# Patient Record
Sex: Male | Born: 1955 | Race: White | Hispanic: No | Marital: Married | Smoking: Never smoker
Health system: Southern US, Community
[De-identification: ages and names within clinical notes are randomized; demographics above are authoritative.]

## PROBLEM LIST (undated history)

## (undated) DIAGNOSIS — R413 Other amnesia: Secondary | ICD-10-CM

## (undated) DIAGNOSIS — E785 Hyperlipidemia, unspecified: Secondary | ICD-10-CM

## (undated) DIAGNOSIS — I10 Essential (primary) hypertension: Secondary | ICD-10-CM

## (undated) DIAGNOSIS — G4730 Sleep apnea, unspecified: Secondary | ICD-10-CM

## (undated) DIAGNOSIS — E119 Type 2 diabetes mellitus without complications: Secondary | ICD-10-CM

## (undated) DIAGNOSIS — I4891 Unspecified atrial fibrillation: Secondary | ICD-10-CM

## (undated) DIAGNOSIS — D649 Anemia, unspecified: Secondary | ICD-10-CM

## (undated) DIAGNOSIS — M1990 Unspecified osteoarthritis, unspecified site: Secondary | ICD-10-CM

## (undated) DIAGNOSIS — R7303 Prediabetes: Secondary | ICD-10-CM

## (undated) HISTORY — PX: JOINT REPLACEMENT: SHX530

## (undated) HISTORY — PX: GASTRIC BYPASS: SHX52

## (undated) HISTORY — PX: HERNIA REPAIR: SHX51

---

## 2004-11-10 ENCOUNTER — Ambulatory Visit: Payer: Self-pay | Admitting: Family Medicine

## 2007-05-08 ENCOUNTER — Ambulatory Visit: Payer: Self-pay | Admitting: Unknown Physician Specialty

## 2007-05-08 IMAGING — CR DG CHEST 2V
1 series · 2 of 2 positions shown · non-contrast
Comparison: none

REASON FOR EXAM: [DATE]--HTN
COMMENTS:   LMP: (Male)

[Series 1: view not recorded · 0.17mm/px · 2 of 2 slices shown]
[im 1/2]
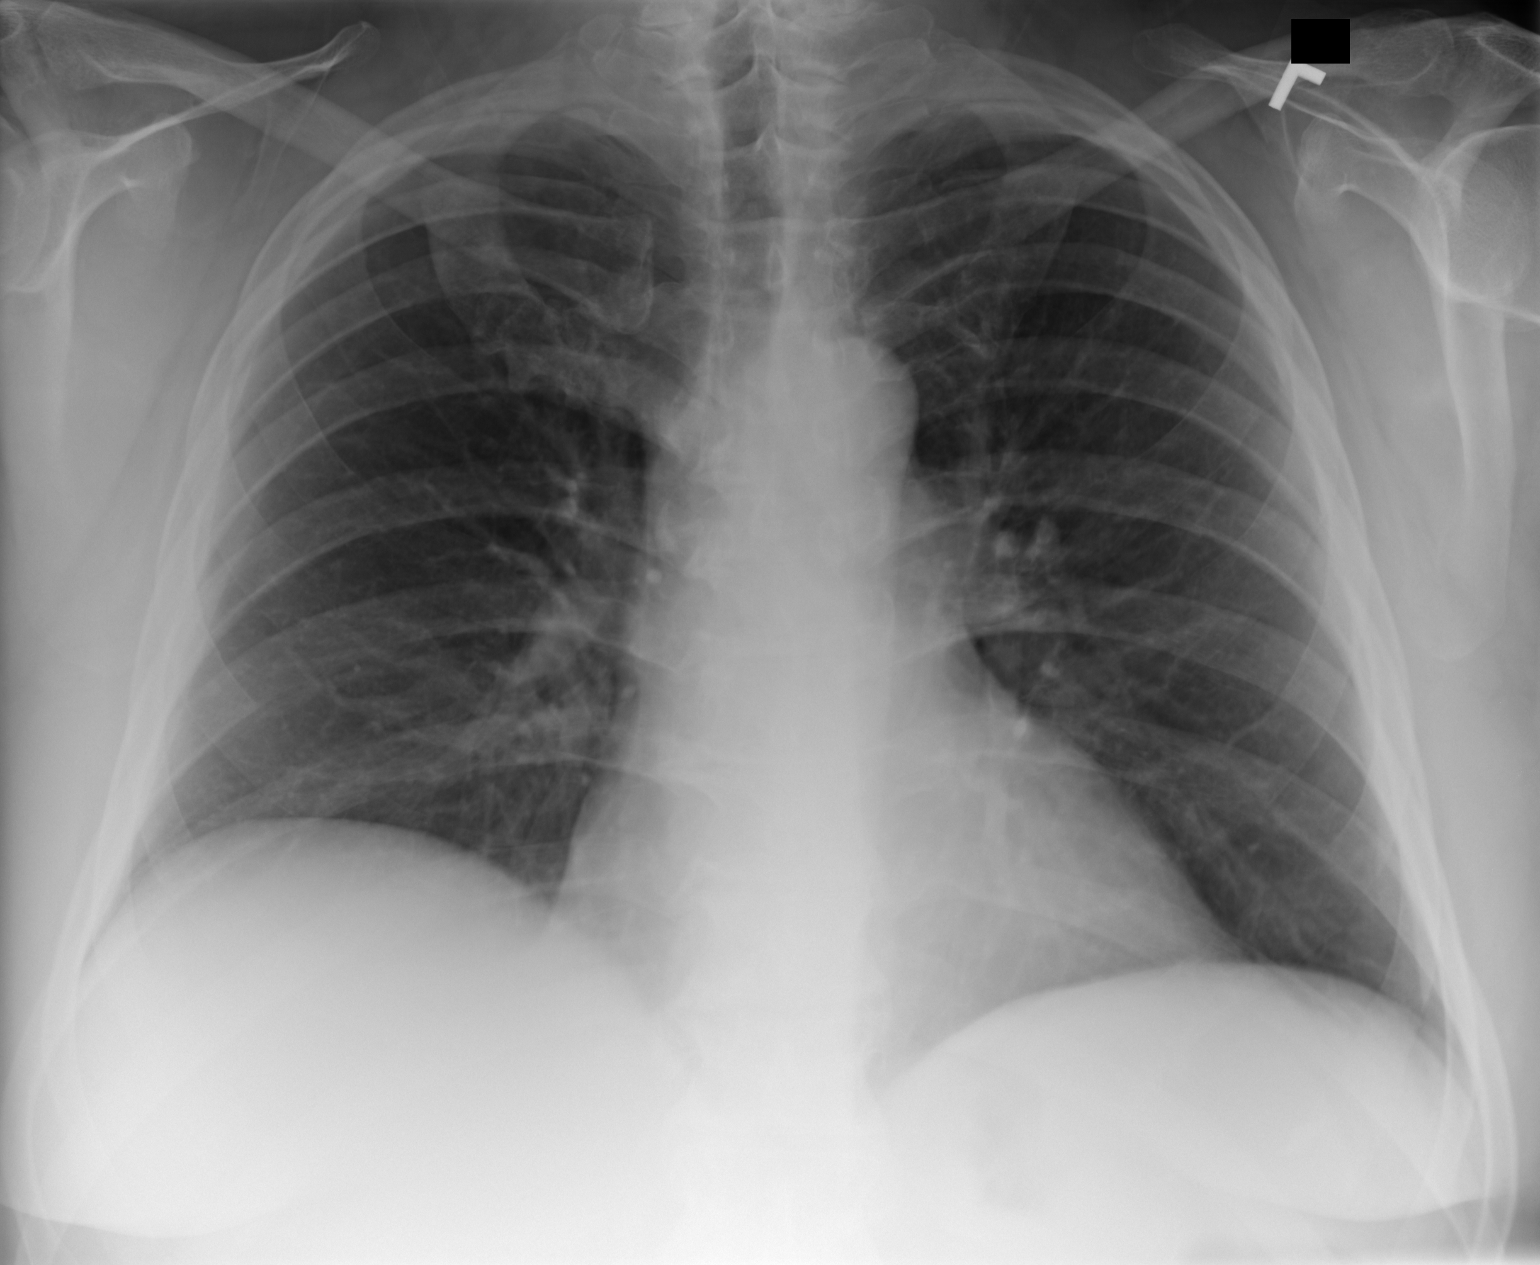
[im 2/2]
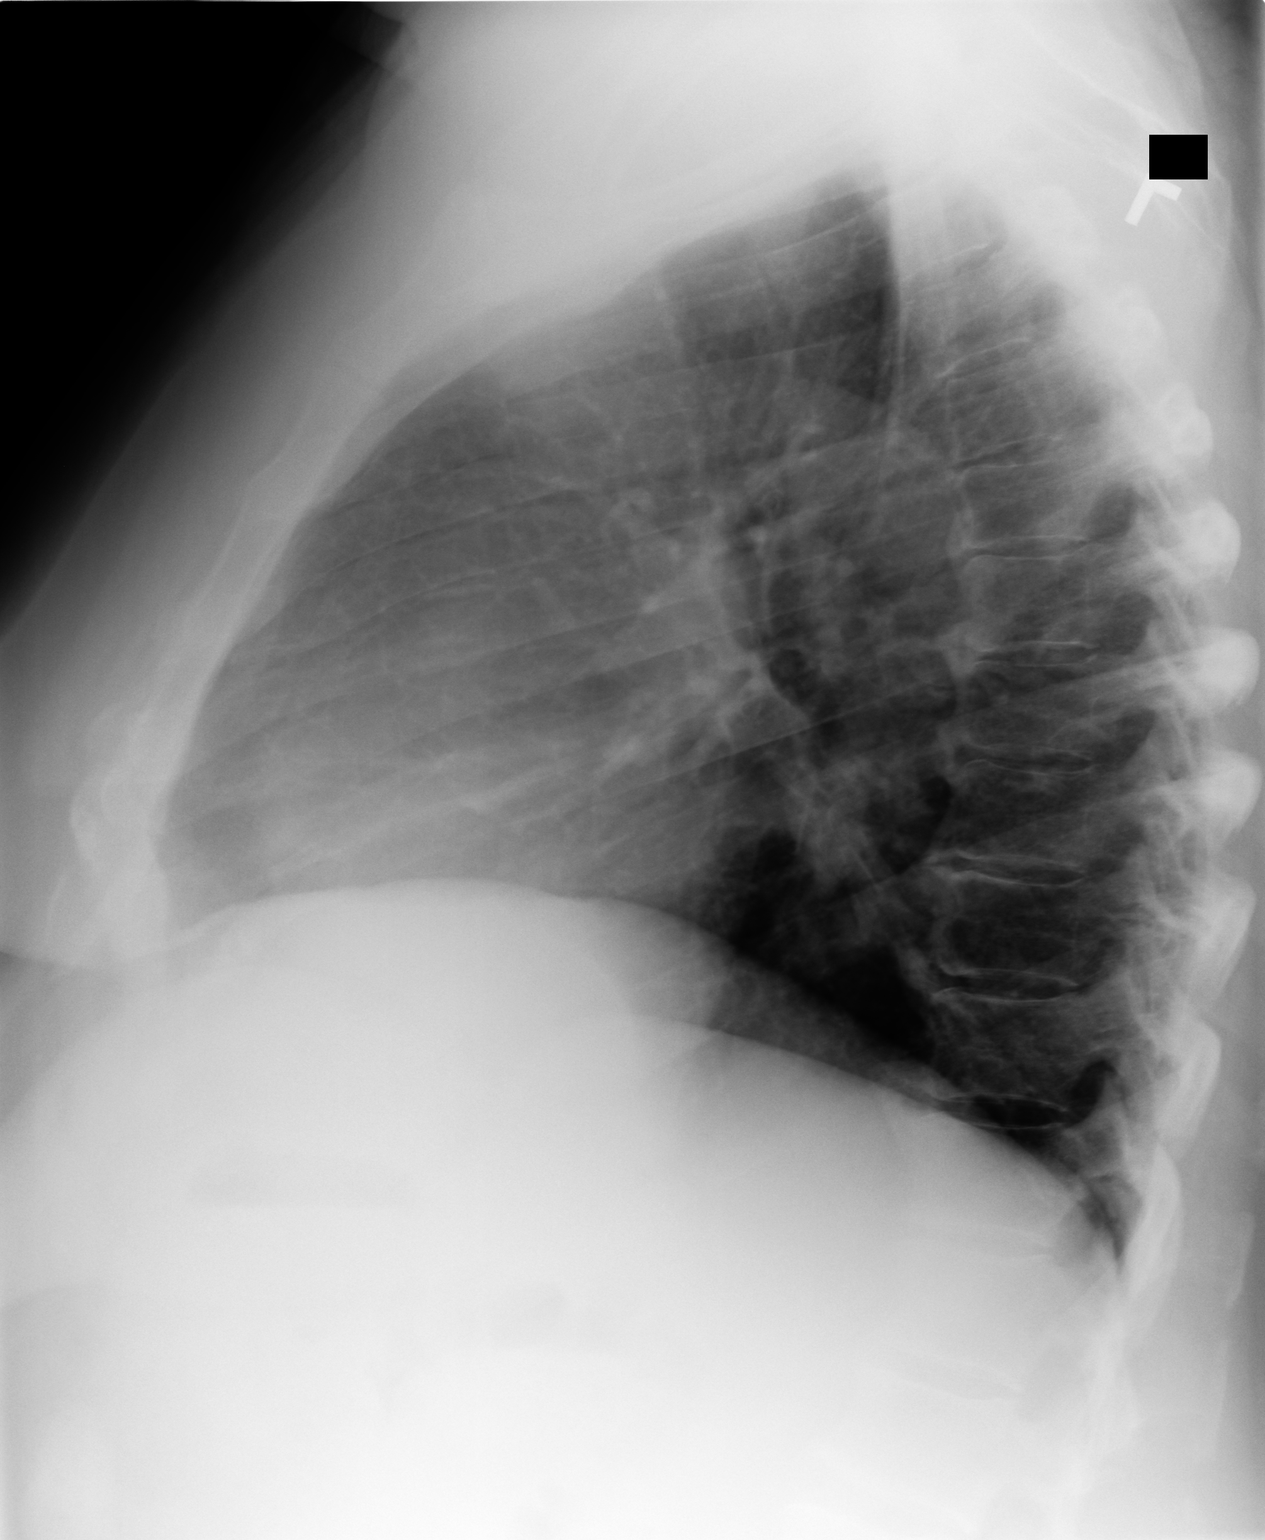

[2 of 2 positions shown; findings below may reference images not displayed]

PROCEDURE:     DXR - DXR CHEST PA (OR AP) AND LATERAL  - [DATE] [DATE]

RESULT:     There is no previous exam for comparison.

The lungs are clear. The heart and pulmonary vessels are normal. The bony
and mediastinal structures are unremarkable. There is no effusion. There is
no pneumothorax or evidence of congestive failure.
IMPRESSION: No acute cardiopulmonary disease.

## 2007-05-18 ENCOUNTER — Inpatient Hospital Stay: Payer: Self-pay | Admitting: Unknown Physician Specialty

## 2007-05-18 IMAGING — CR DG KNEE 1-2V*R*
1 series · 2 of 2 positions shown · non-contrast
Comparison: none

REASON FOR EXAM: post op TKR
COMMENTS:   Bedside (portable):Y

[Series 1: view not recorded · 0.17mm/px · 2 of 2 slices shown]
[im 1/2]
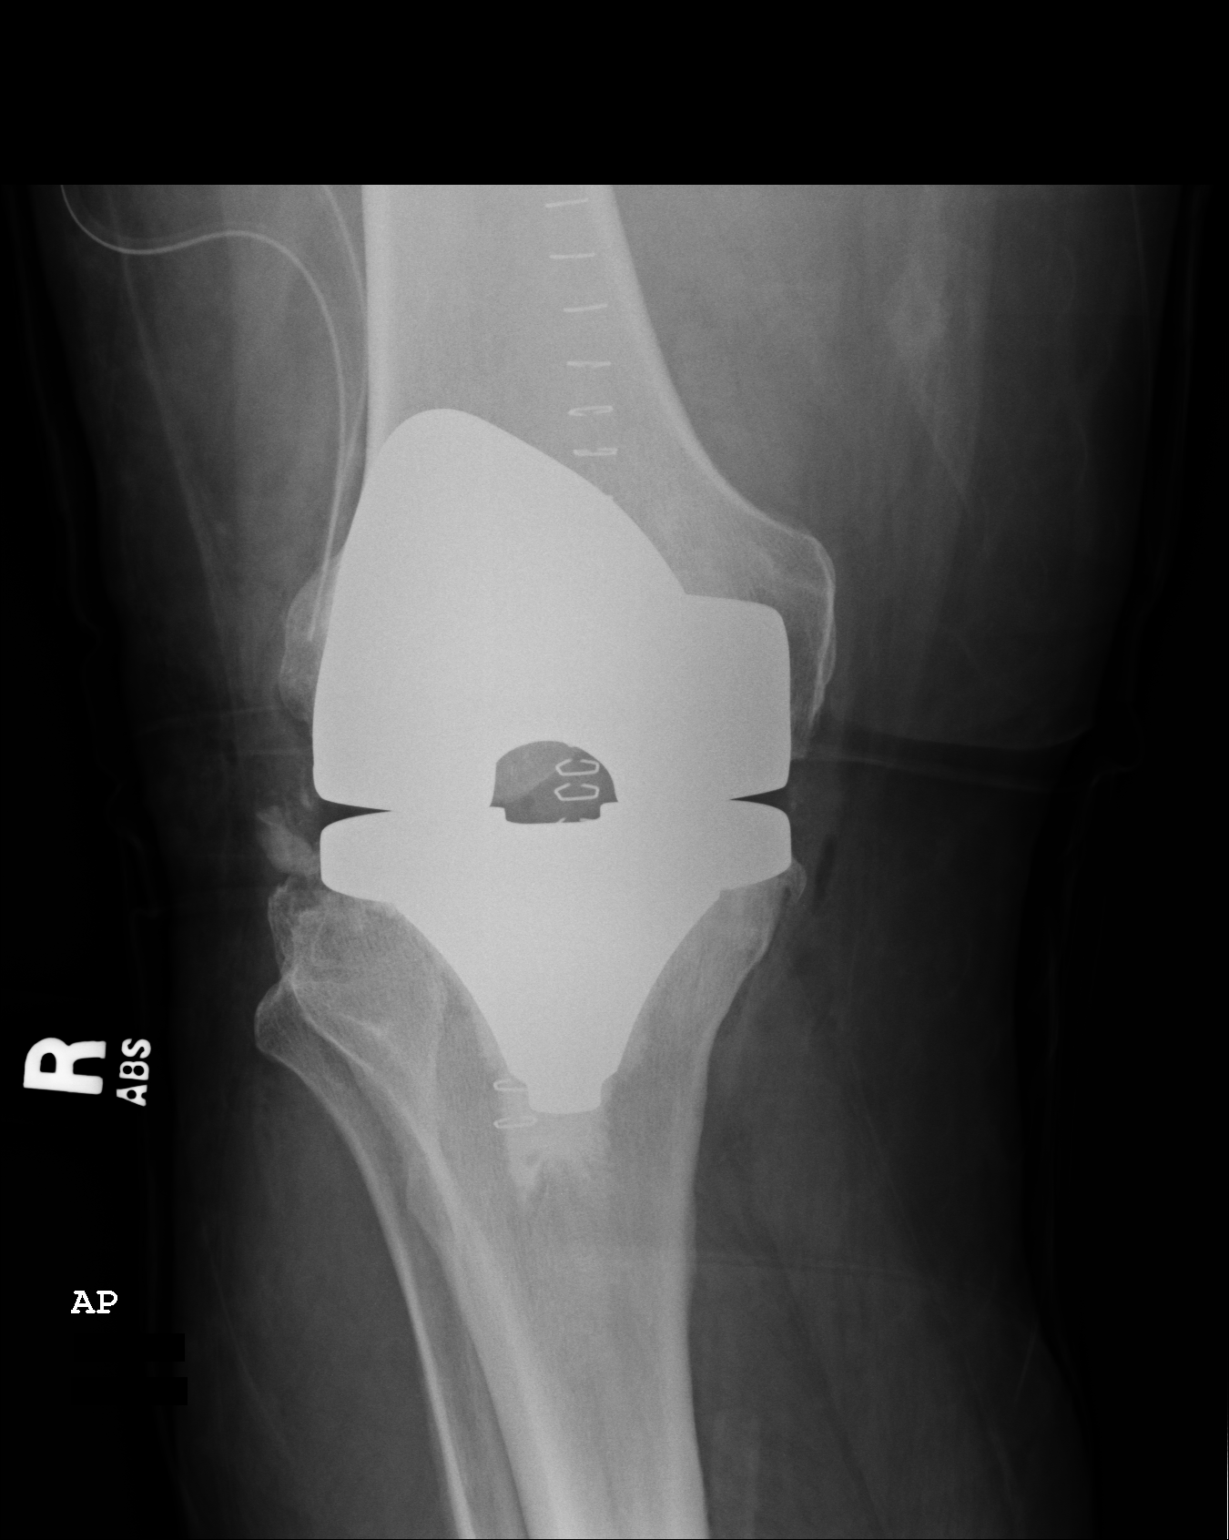
[im 2/2]
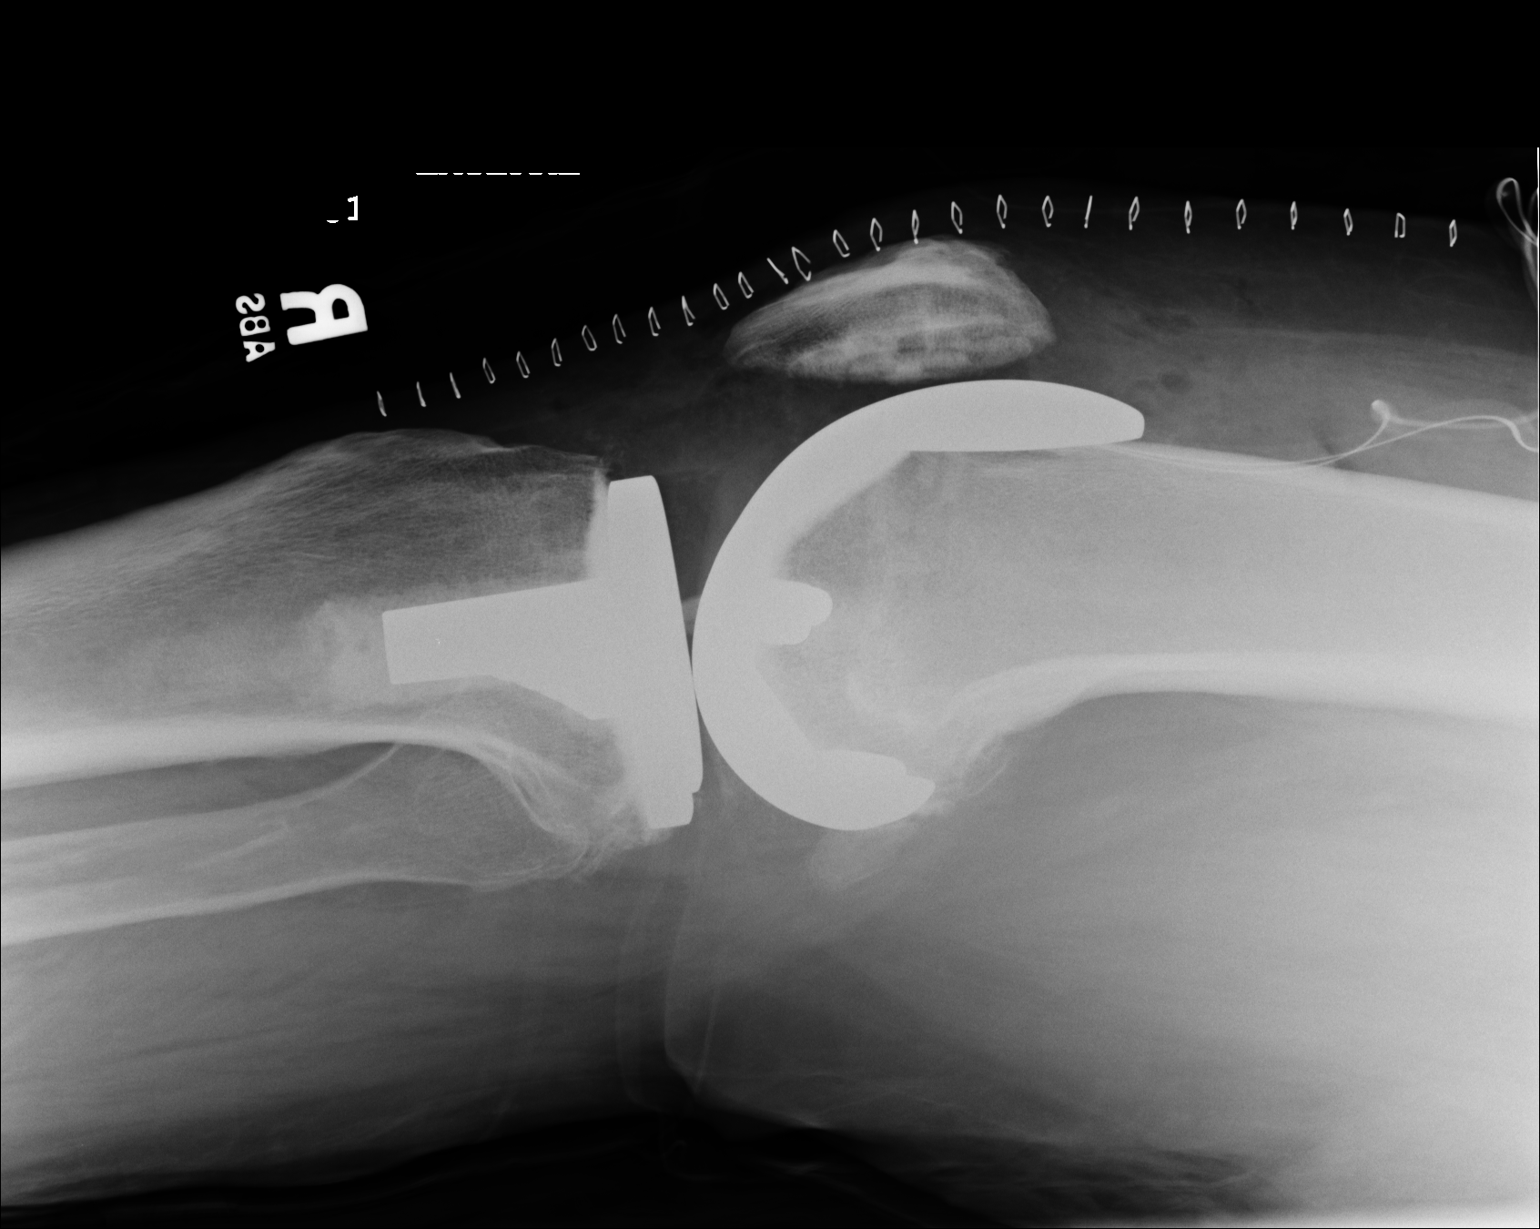

[2 of 2 positions shown; findings below may reference images not displayed]

PROCEDURE:     DXR - DXR KNEE RIGHT AP AND LATERAL  - [DATE]  [DATE]

RESULT:     The patient is status post RIGHT knee replacement. There is a
1.3 cm osseous density adjacent to the lateral margin of the tibial
prosthesis and which does not appear to represent a significant finding. No
fracture about the femoral or tibial prosthetic components is seen. There is
no dislocation at the prosthetic knee joint. Surgical drains are present
anteriorly.
IMPRESSION: Please see above.

## 2007-10-11 ENCOUNTER — Ambulatory Visit: Payer: Self-pay | Admitting: Unknown Physician Specialty

## 2007-10-18 ENCOUNTER — Inpatient Hospital Stay: Payer: Self-pay | Admitting: Unknown Physician Specialty

## 2007-10-18 IMAGING — CR DG KNEE 1-2V*L*
1 series · 2 of 2 positions shown · non-contrast
Comparison: none

REASON FOR EXAM: post op TKR
COMMENTS:   Bedside (portable):Y

PROCEDURE:     DXR - DXR KNEE LEFT AP AND LATERAL  - [DATE]  [DATE]
RESULT:     The patient has had a total LEFT knee replacement.  Surgical
staples are noted over the anterior knee. Surgical drainage tube is noted.

[Series 1: view not recorded · 0.17mm/px · 2 of 2 slices shown]
[im 1/2]
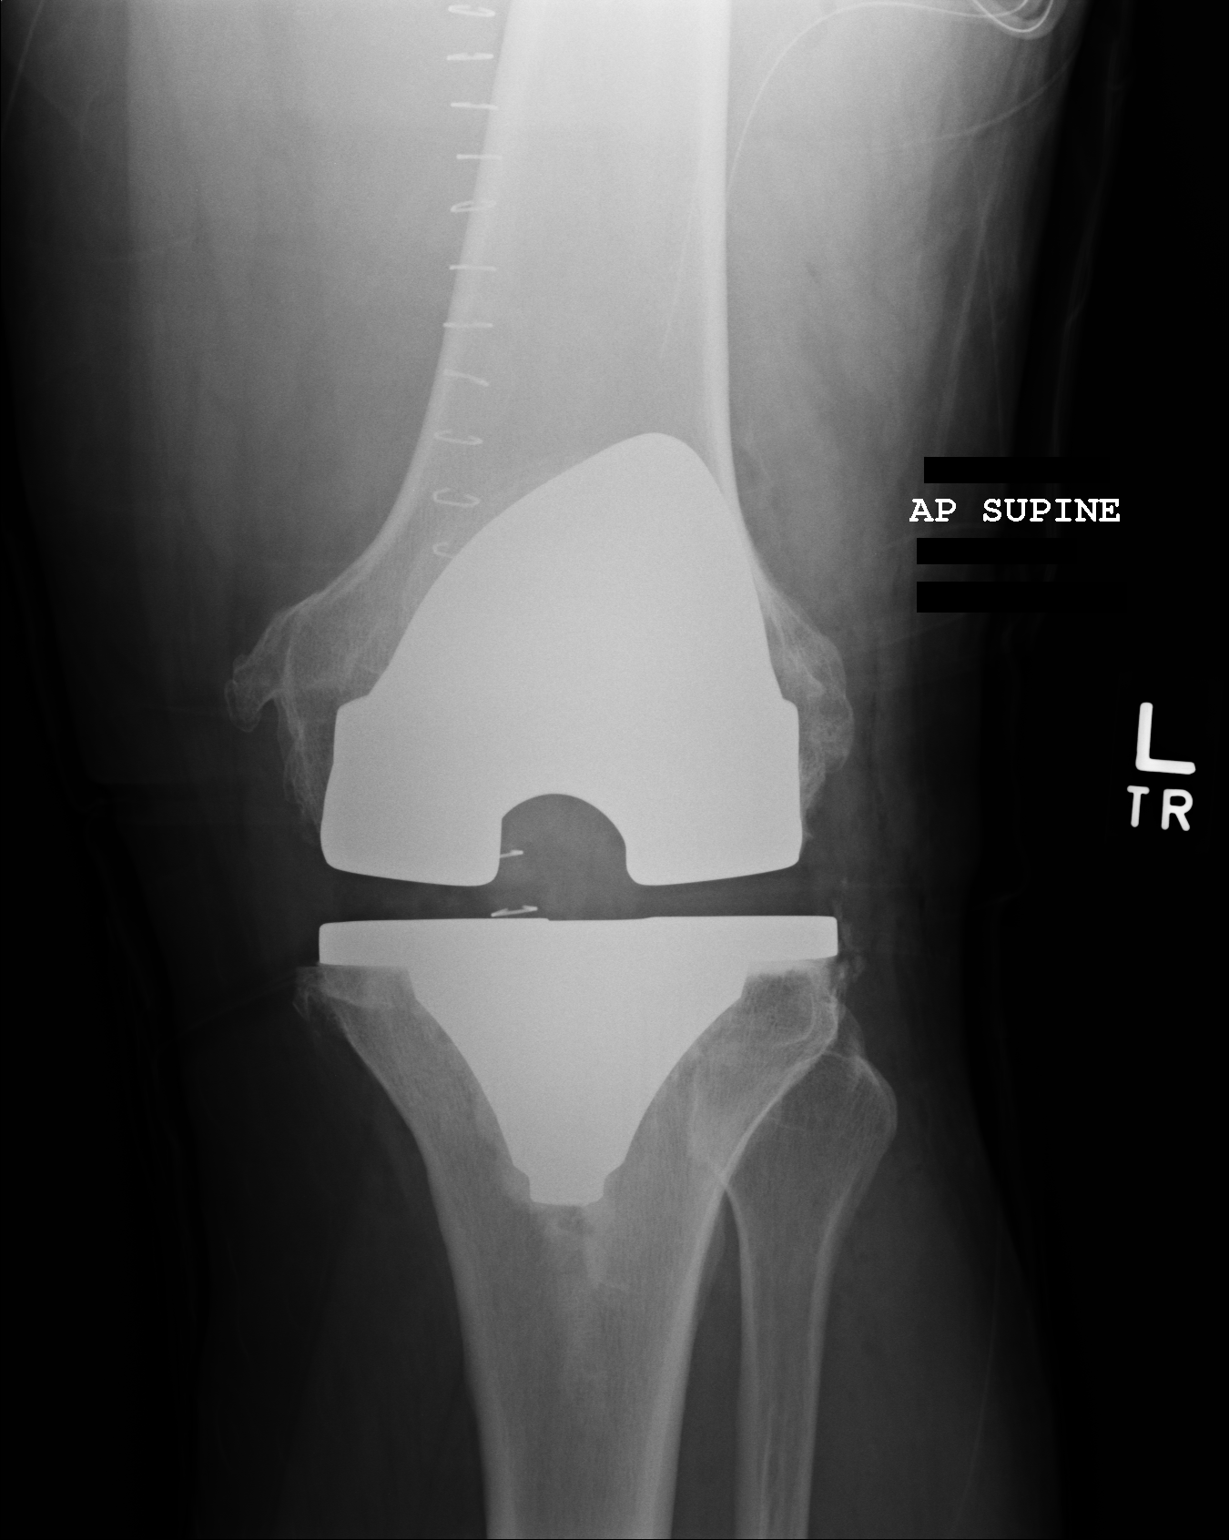
[im 2/2]
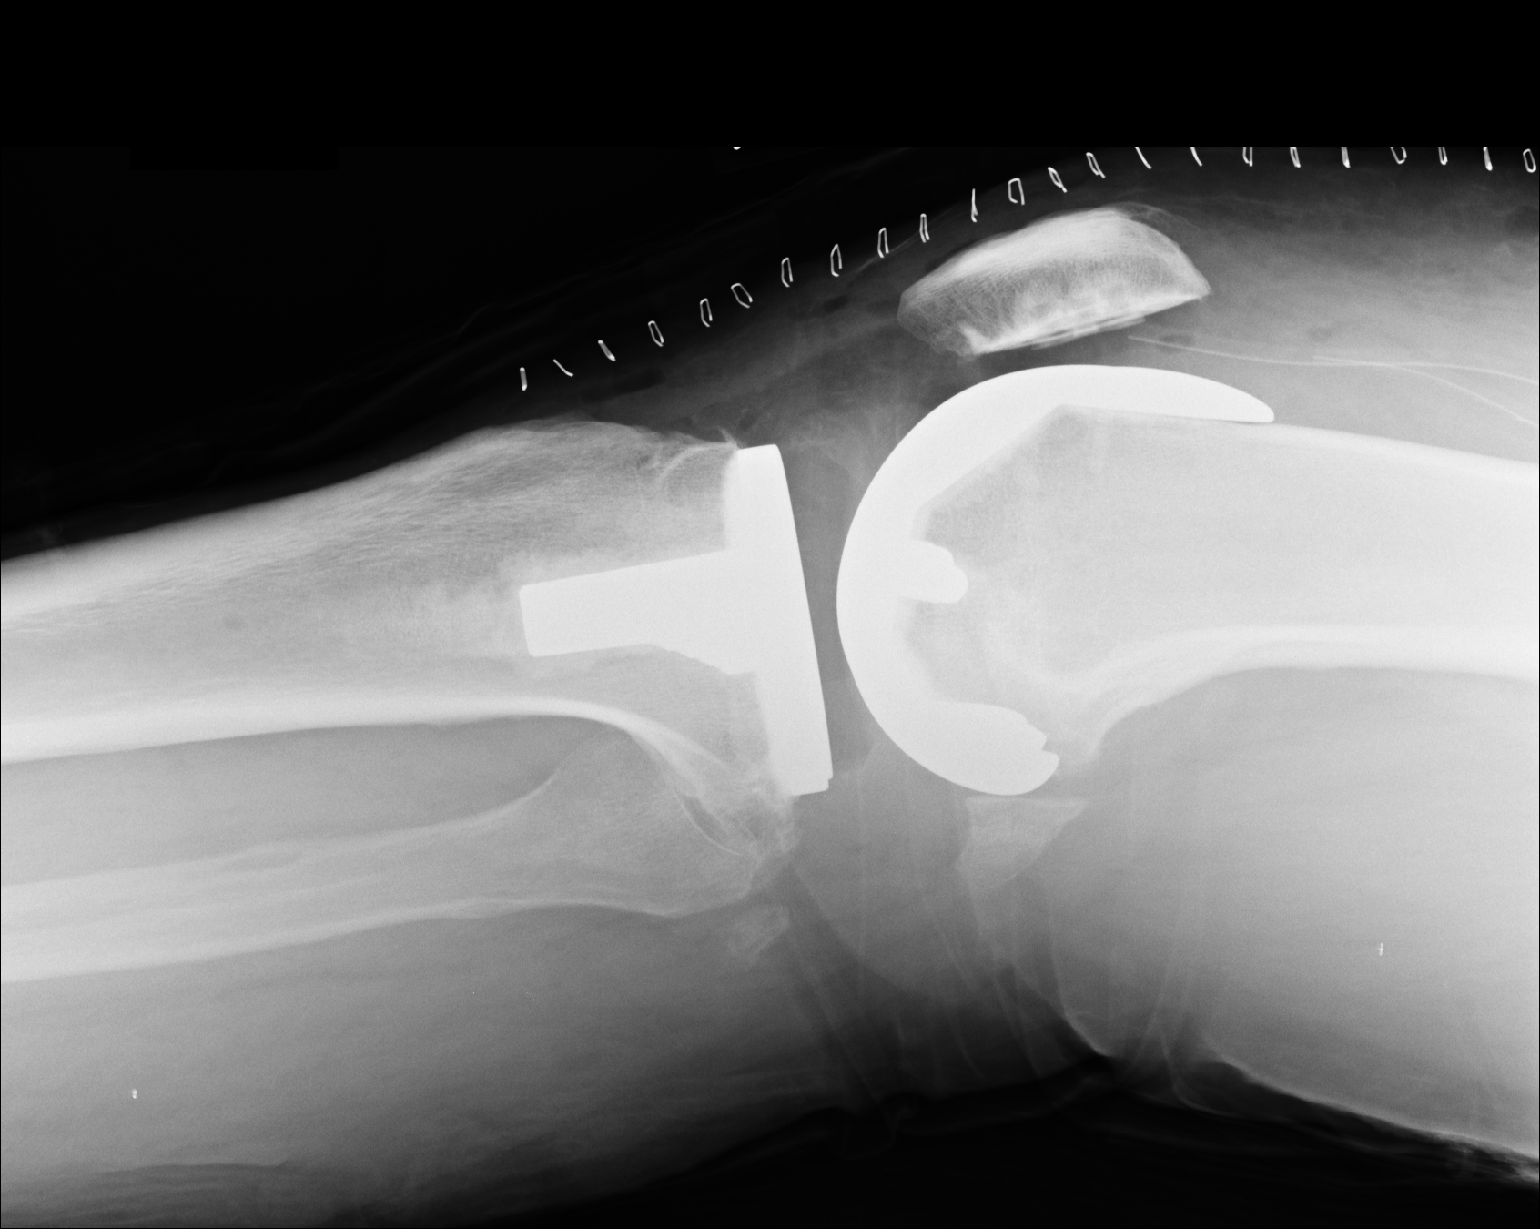

[2 of 2 positions shown; findings below may reference images not displayed]

IMPRESSION: 1.     The patient is status post open reduction and internal fixation.
2.     No acute abnormality is identified.

## 2013-02-14 ENCOUNTER — Ambulatory Visit: Payer: Self-pay | Admitting: Surgery

## 2013-02-14 LAB — COMPREHENSIVE METABOLIC PANEL
Albumin: 3.8 g/dL (ref 3.4–5.0)
Anion Gap: 1 — ABNORMAL LOW (ref 7–16)
BUN: 13 mg/dL (ref 7–18)
Calcium, Total: 9 mg/dL (ref 8.5–10.1)
EGFR (African American): >60
Potassium: 3.9 mmol/L (ref 3.5–5.1)
SGOT(AST): 36 U/L (ref 15–37)
SGPT (ALT): 49 U/L (ref 12–78)

## 2013-02-14 LAB — CBC WITH DIFFERENTIAL/PLATELET
Basophil #: 0.1 10*3/uL (ref 0.0–0.1)
Eosinophil #: 0.2 10*3/uL (ref 0.0–0.7)
Eosinophil %: 2.7 %
HCT: 44 % (ref 40.0–52.0)
Lymphocyte #: 1.1 10*3/uL (ref 1.0–3.6)
MCH: 31.2 pg (ref 26.0–34.0)
MCV: 91 fL (ref 80–100)
Monocyte #: 0.6 x10 3/mm (ref 0.2–1.0)
Monocyte %: 8.5 %
Neutrophil #: 4.8 10*3/uL (ref 1.4–6.5)
RBC: 4.82 10*6/uL (ref 4.40–5.90)
WBC: 6.7 10*3/uL (ref 3.8–10.6)

## 2013-02-14 LAB — LIPASE, BLOOD: Lipase: 78 U/L (ref 73–393)

## 2013-02-14 LAB — TSH: Thyroid Stimulating Horm: 3.77 u[IU]/mL

## 2013-02-14 LAB — PROTIME-INR: Prothrombin Time: 13.5 secs (ref 11.5–14.7)

## 2013-02-14 LAB — PHOSPHORUS: Phosphorus: 2.7 mg/dL (ref 2.5–4.9)

## 2013-02-14 LAB — IRON AND TIBC: Iron: 221 ug/dL — ABNORMAL HIGH (ref 65–175)

## 2013-02-14 LAB — FERRITIN: Ferritin (ARMC): 252 ng/mL (ref 8–388)

## 2013-02-14 LAB — FOLATE: Folic Acid: 46.1 ng/mL (ref 3.1–100.0)

## 2013-02-14 LAB — APTT: Activated PTT: 30.4 secs (ref 23.6–35.9)

## 2013-02-14 IMAGING — CR DG CHEST 2V
1 series · 2 of 2 positions shown · non-contrast
Comparison: [DATE].

CLINICAL DATA: History of obesity with high blood pressure and high
cholesterol.

EXAM:
CHEST  2 VIEW

[Series 1: w chest pa · 0.14mm/px · 2 of 2 slices shown]
[im 1/2]
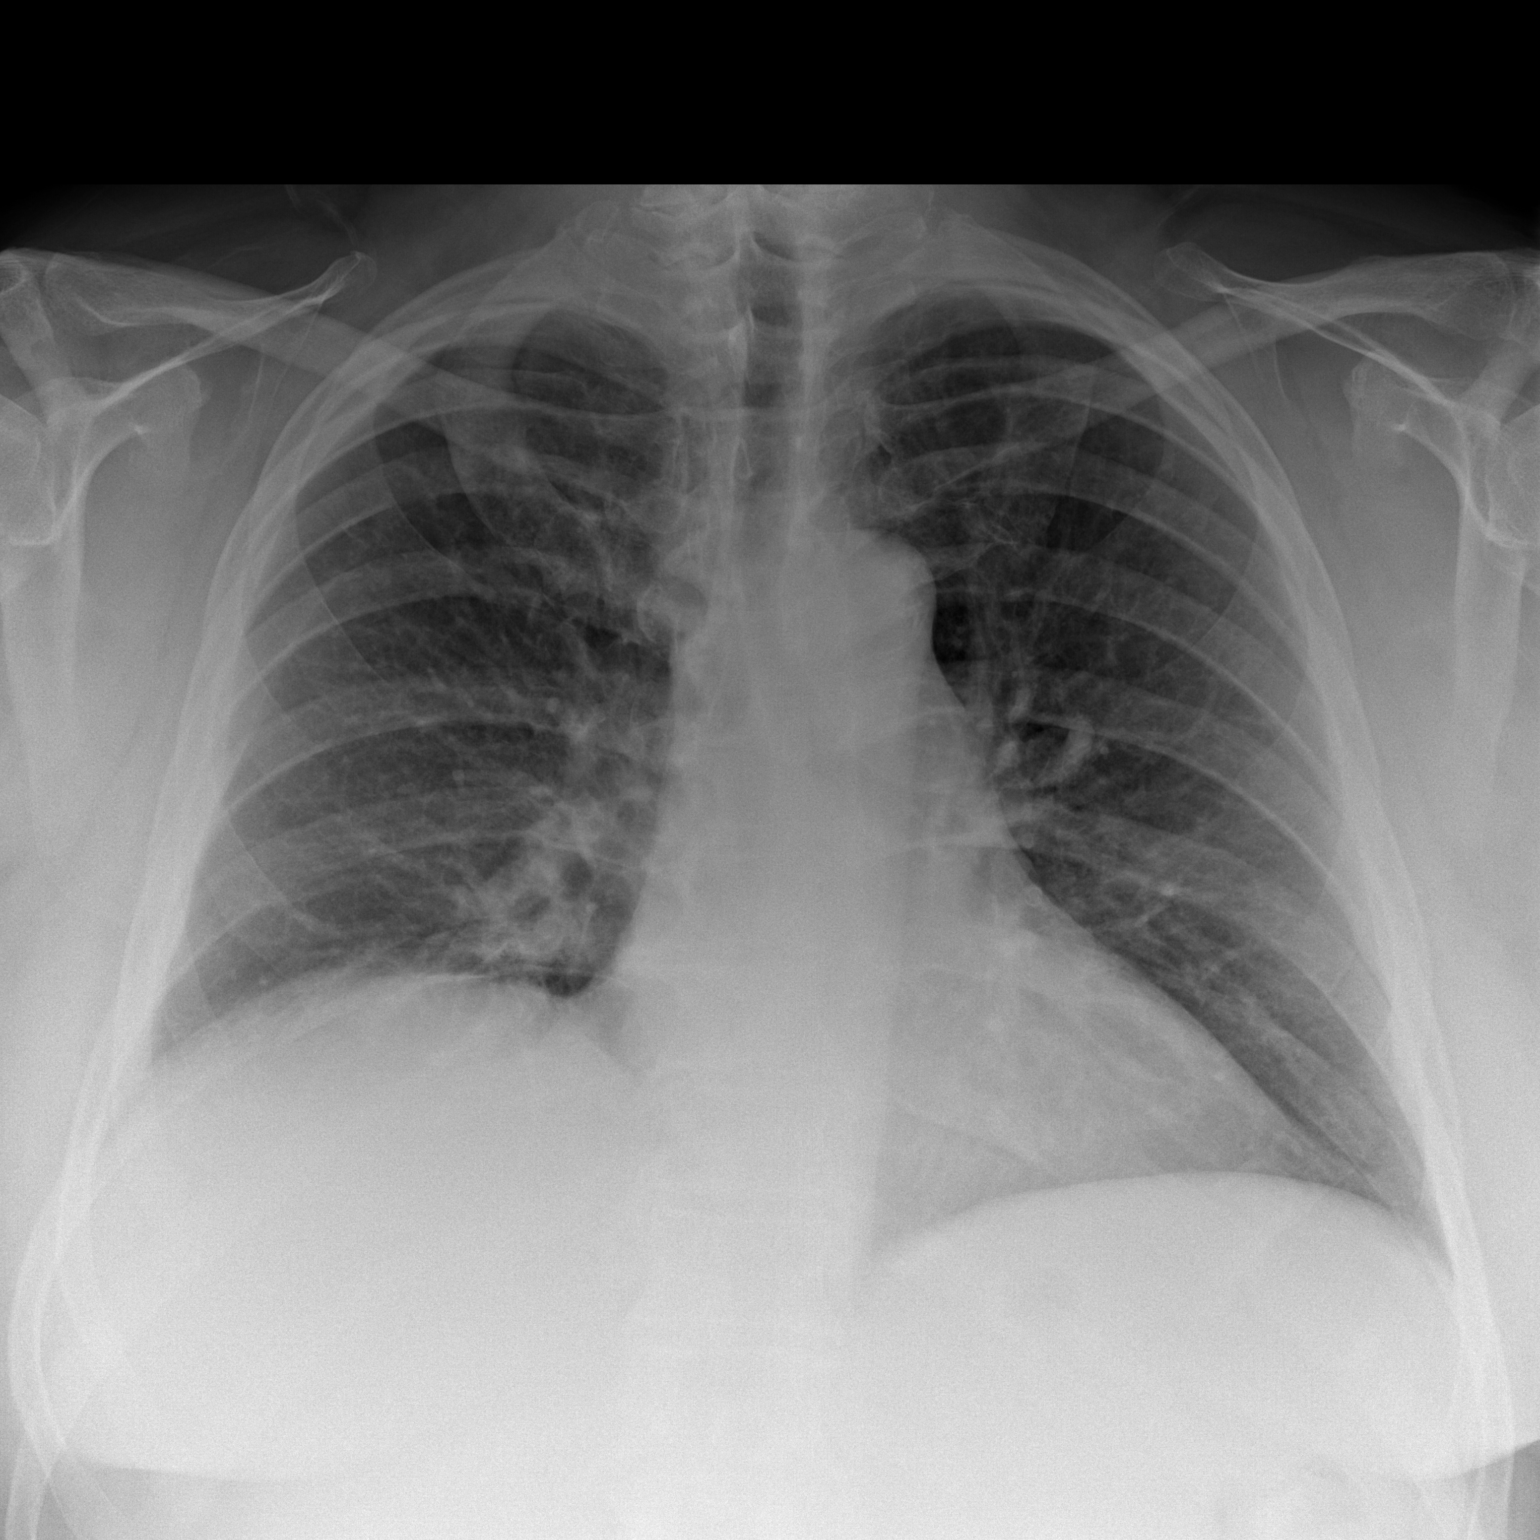
[im 2/2]
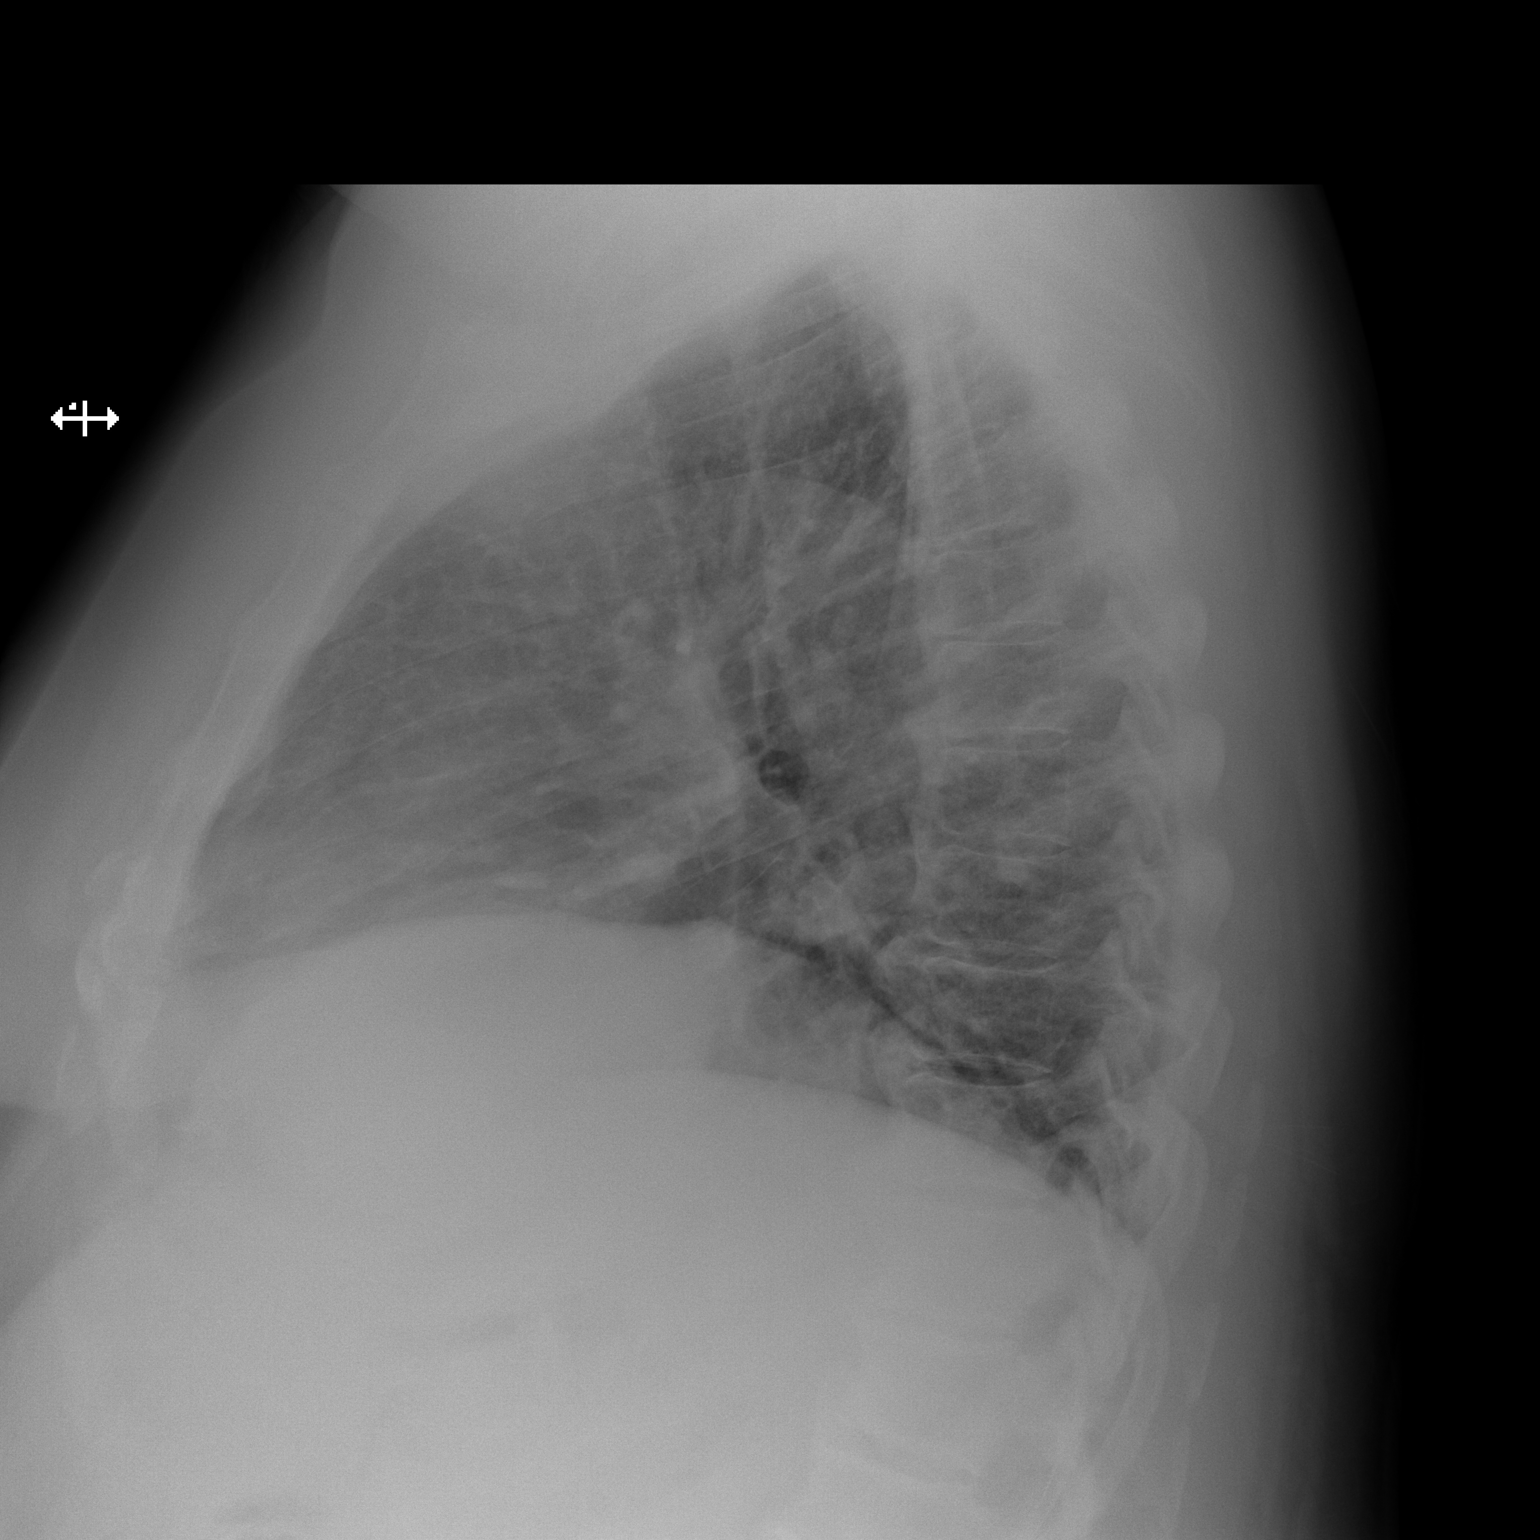

[2 of 2 positions shown; findings below may reference images not displayed]

FINDINGS: The right hemidiaphragm is slightly higher than the left. This is
not a new finding. The cardiac silhouette is top-normal in size. The
central pulmonary vascularity is more prominent today than in the
past. The pulmonary interstitial markings also are slightly more
conspicuous. There is no pleural effusion or pneumothorax. The
observed portions of the bony thorax exhibit no acute abnormalities.
There is calcification of the anterior longitudinal ligament of the
thoracic spine.
IMPRESSION: Slightly increased interstitial markings and pulmonary vascular
prominence suggests low-grade CHF. A follow-up PA and lateral chest
x-ray with deep inspiration would be useful.

## 2013-02-14 IMAGING — RF DG UGI W/O KUB
14 series · 15 of 15 positions shown · non-contrast
Comparison: None.

FLUOROSCOPY TIME:  1 min 12 seconds.

CLINICAL DATA: Moderate obesity.

EXAM:
UPPER GI SERIES WITHOUT KUB
TECHNIQUE: Routine upper GI series was performed with thin/high density/water
soluble barium.

[Series 1: fluoro_barium 2fps_bw · 0.18mm/px · 1 of 1 slices shown (1 of 14)]
[im 1/1]
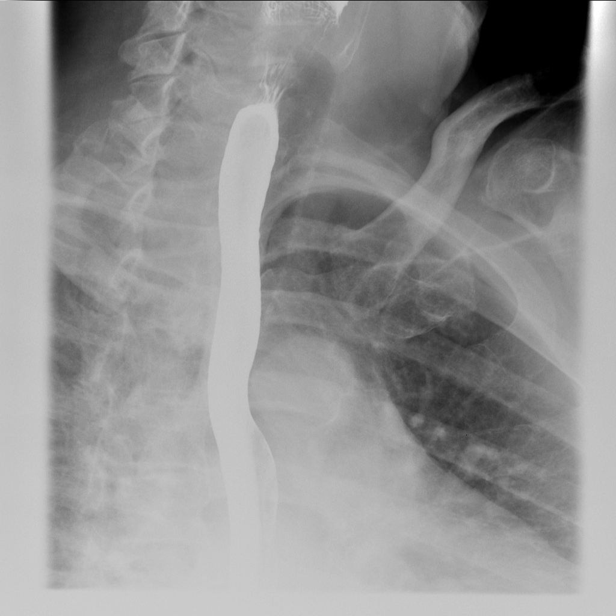

[Series 2: fluoro_barium 2fps_bw · 0.18mm/px · 1 of 1 slices shown (2 of 14)]
[im 1/1]
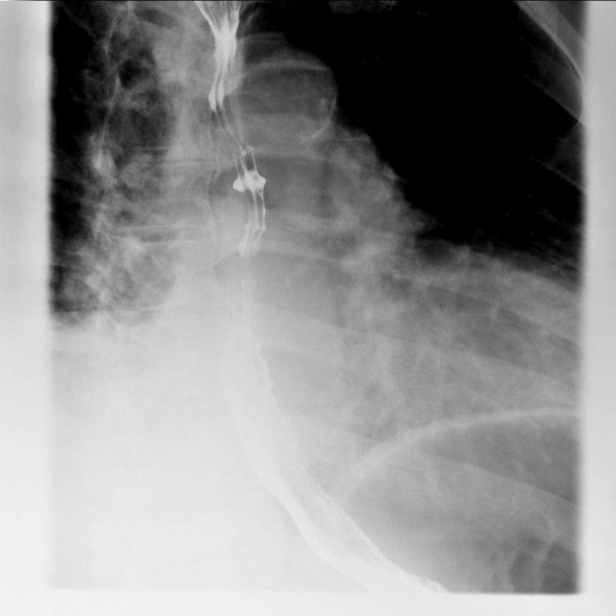

[Series 3: fluoro_barium 2fps_bw · 0.18mm/px · 1 of 1 slices shown (3 of 14)]
[im 1/1]
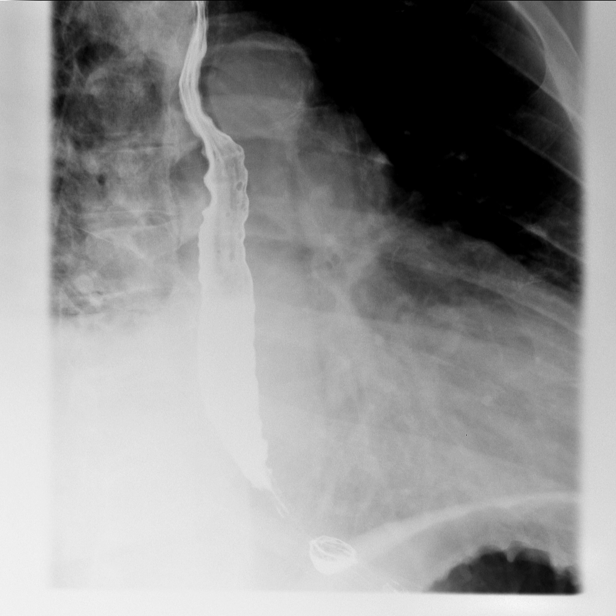

[Series 4: fluoro_barium 2fps_bw · 0.18mm/px · 1 of 1 slices shown (4 of 14)]
[im 1/1]
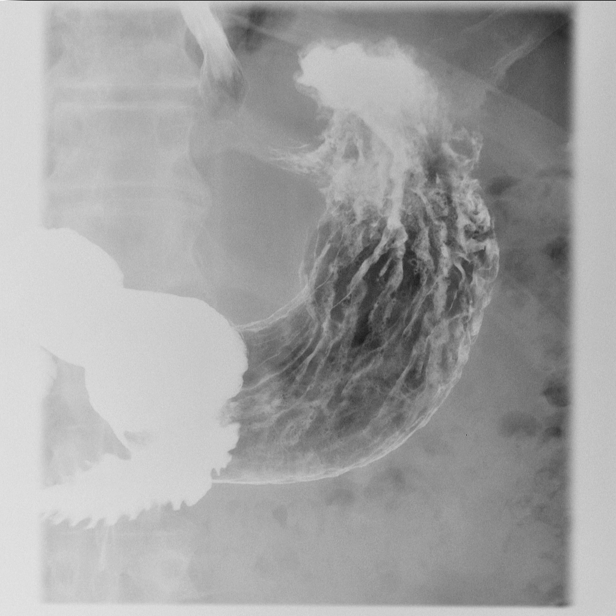

[Series 5: fluoro_barium 2fps_bw · 0.18mm/px · 1 of 1 slices shown (5 of 14)]
[im 1/1]
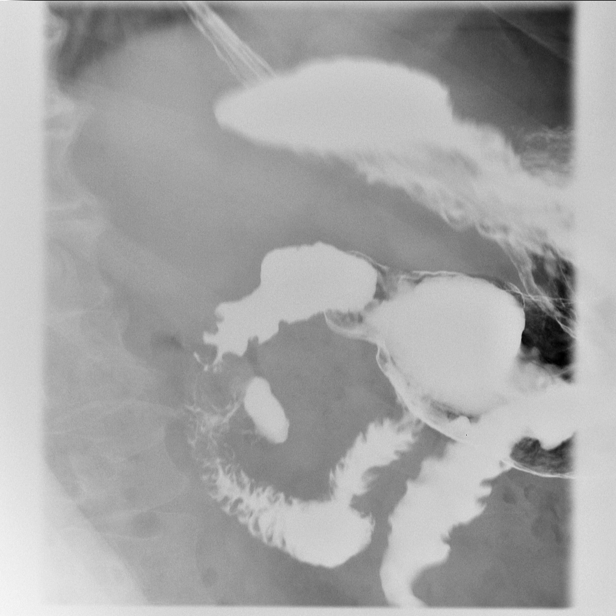

[Series 6: fluoro_barium 2fps_bw · 0.18mm/px · 1 of 1 slices shown (6 of 14)]
[im 1/1]
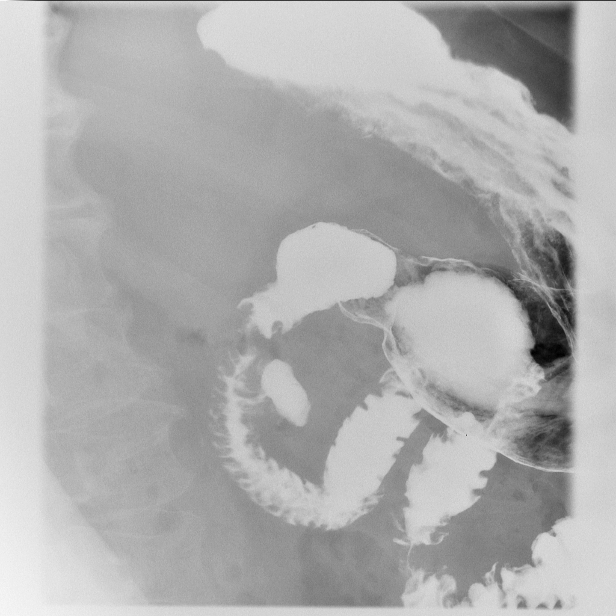

[Series 7: fluoro_barium 2fps_bw · 0.18mm/px · 1 of 1 slices shown (7 of 14)]
[im 1/1]
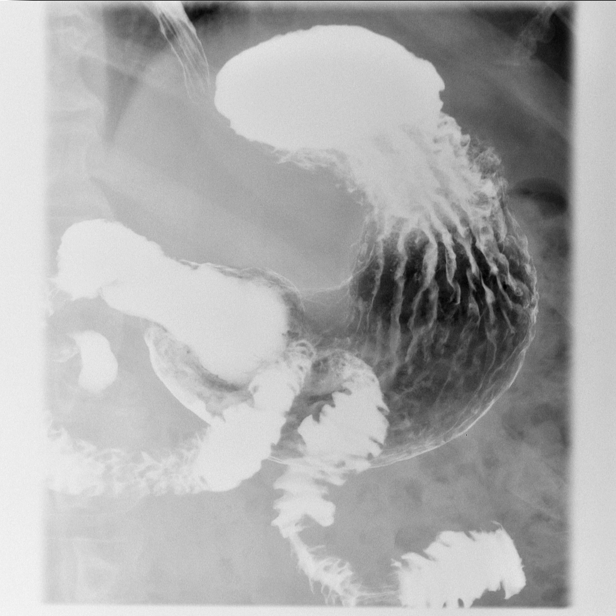

[Series 8: fluoro_barium 2fps_bw · 0.18mm/px · 1 of 1 slices shown (8 of 14)]
[im 1/1]
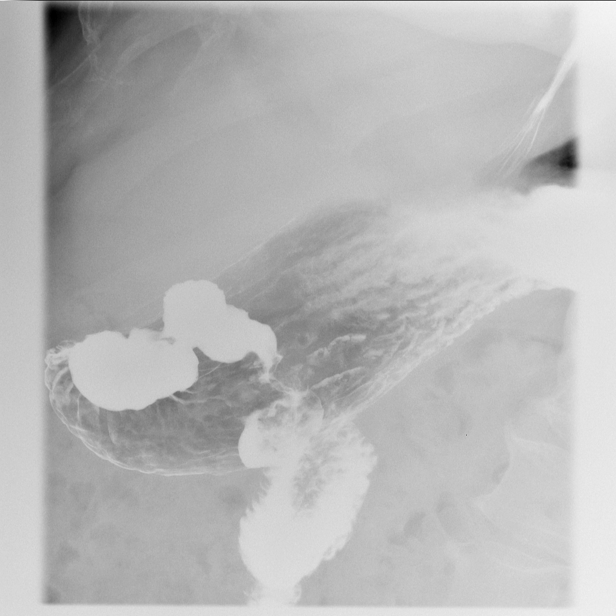

[Series 9: fluoro_barium 2fps_bw · 0.18mm/px · 2 of 2 frames shown (9 of 14)]
[frame 1/2]
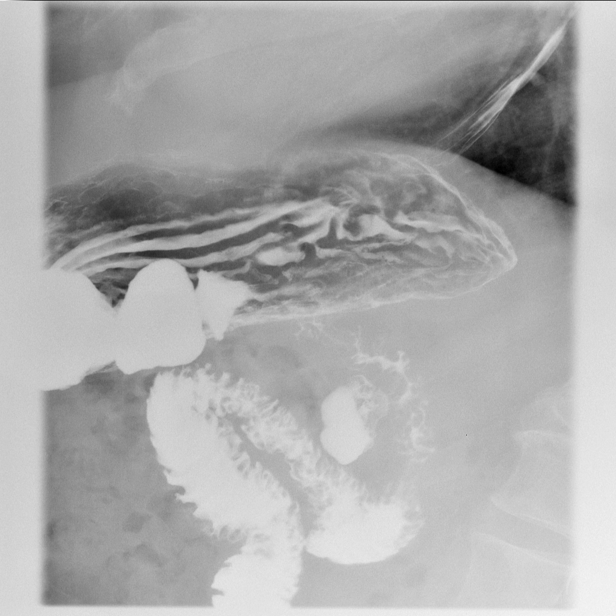
[frame 2/2]
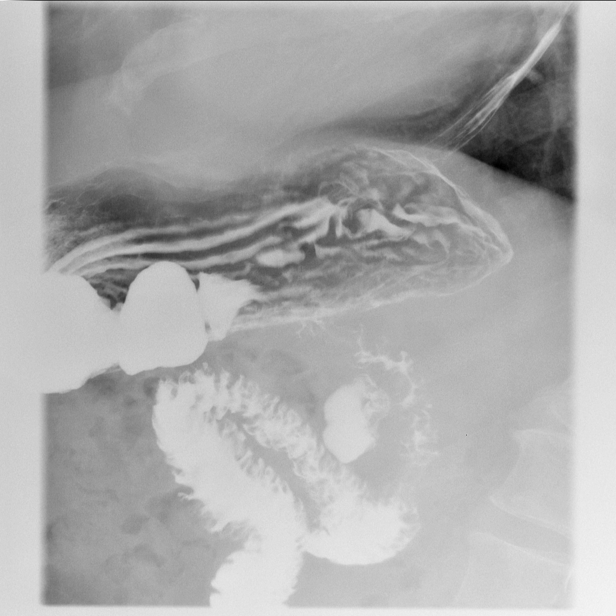

[Series 10: fluoro_barium 2fps_bw · 0.18mm/px · 1 of 1 slices shown (10 of 14)]
[im 1/1]
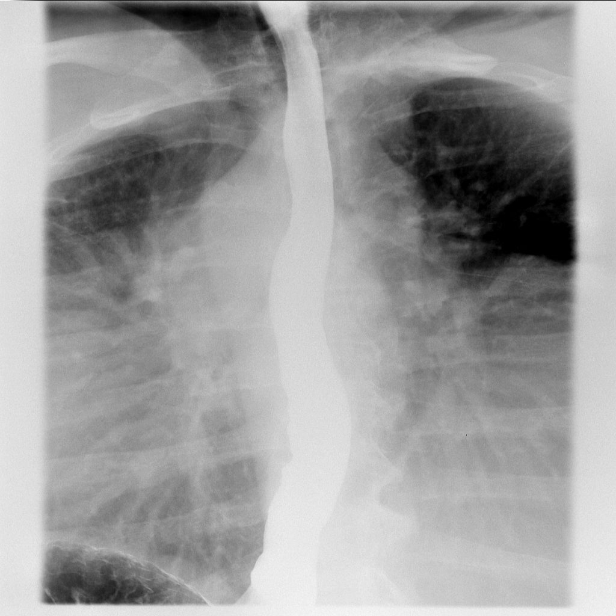

[Series 11: fluoro_barium 2fps_bw · 0.18mm/px · 1 of 1 slices shown (11 of 14)]
[im 1/1]
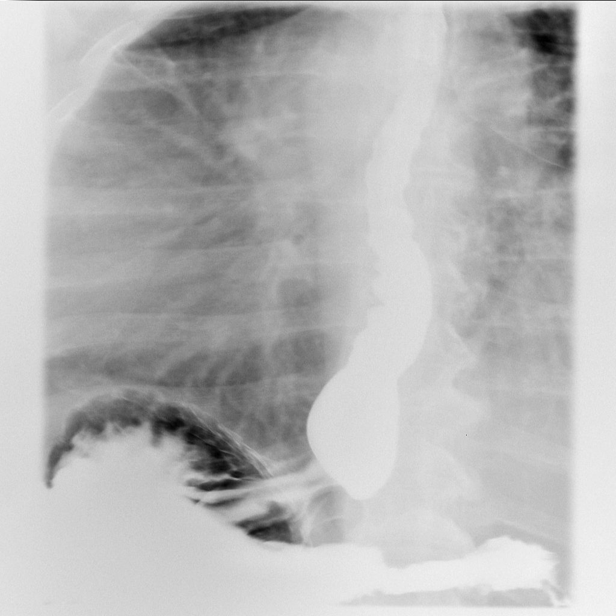

[Series 12: fluoro_barium 2fps_bw · 0.18mm/px · 1 of 1 slices shown (12 of 14)]
[im 1/1]
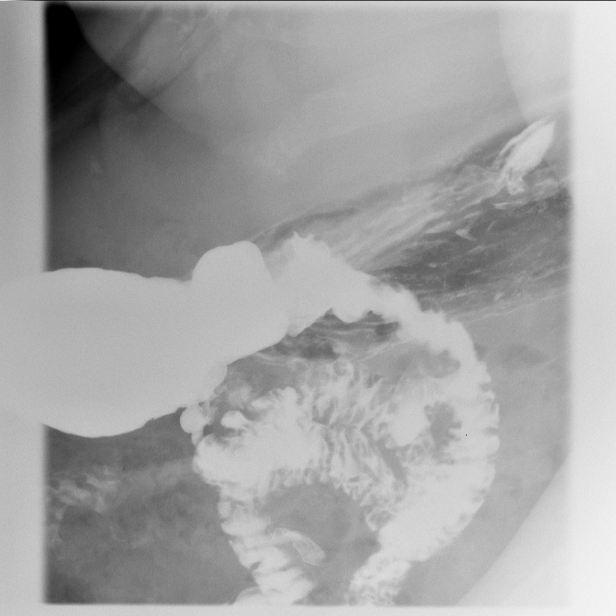

[Series 13: fluoro_barium 2fps_bw · 0.18mm/px · 1 of 1 slices shown (13 of 14)]
[im 1/1]
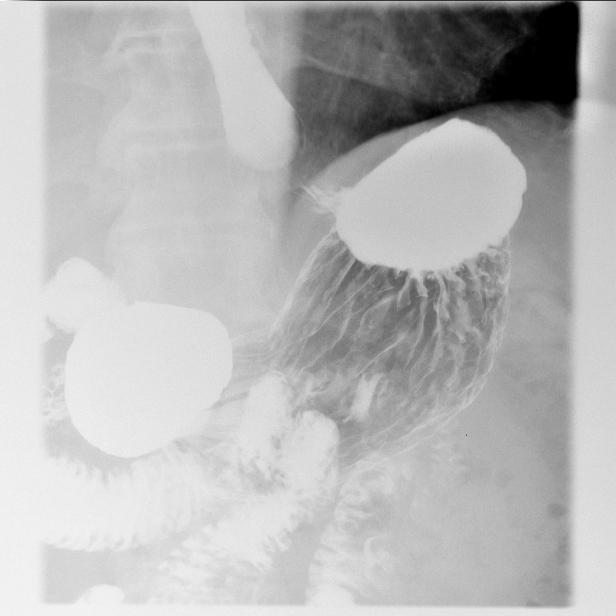

[Series 14: fluoro_barium 2fps_bw · 0.19mm/px · 1 of 1 slices shown (14 of 14)]
[im 1/1]
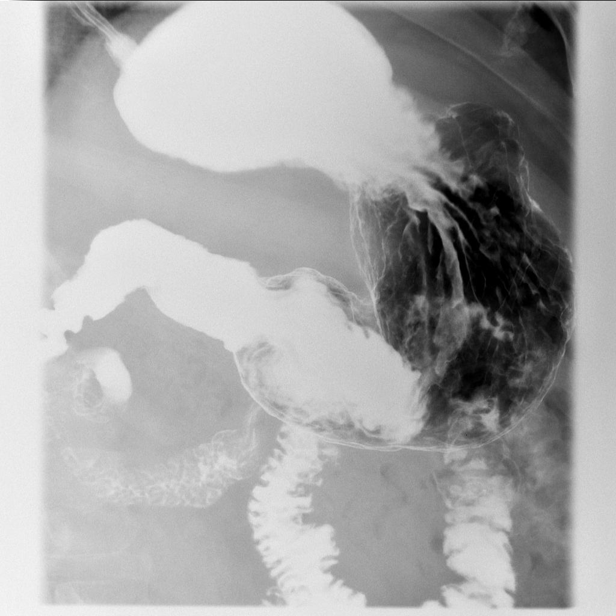

[15 of 15 positions shown; findings below may reference images not displayed]

FINDINGS: No focal esophageal abnormality is identified. Mild tertiary
esophageal contractions present. Mild changes of presbyesophagus
cannot be excluded. No hiatal hernia. Stomach is unremarkable. The
duodenal bulb and C-loop demonstrate no significant abnormality. A
duodenal diverticulum is noted.
IMPRESSION: 1. Cannot exclude mild changes of presbyesophagus. Stomach is
normal. No significant reflux.
2. Small duodenal diverticulum.

## 2013-02-14 IMAGING — US ABDOMEN ULTRASOUND LIMITED
1 series · 14 of 25 positions shown · non-contrast
Comparison: None.

CLINICAL DATA: Hypertension. Elevated cholesterol. Morbid obesity.
Sleep apnea.

EXAM:
US ABDOMEN LIMITED - RIGHT UPPER QUADRANT

[Series 1: abdomen ultrasound limited · 0.35mm/px · 14 of 38 slices shown]
[im 1/38]
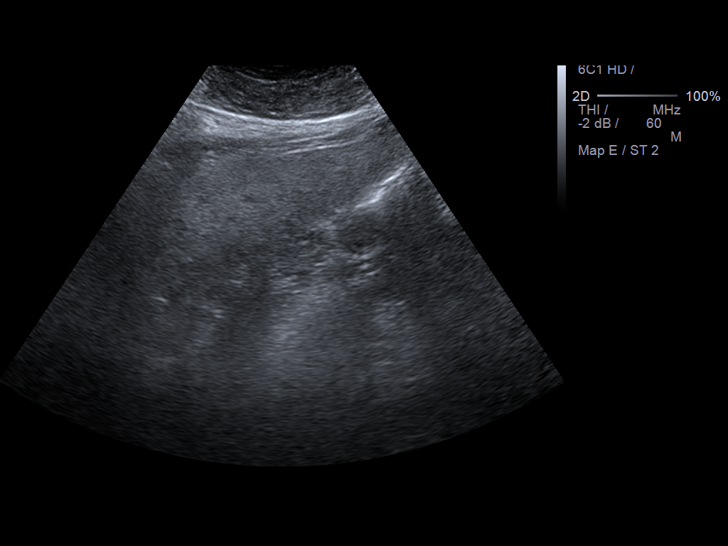
[im 4/38]
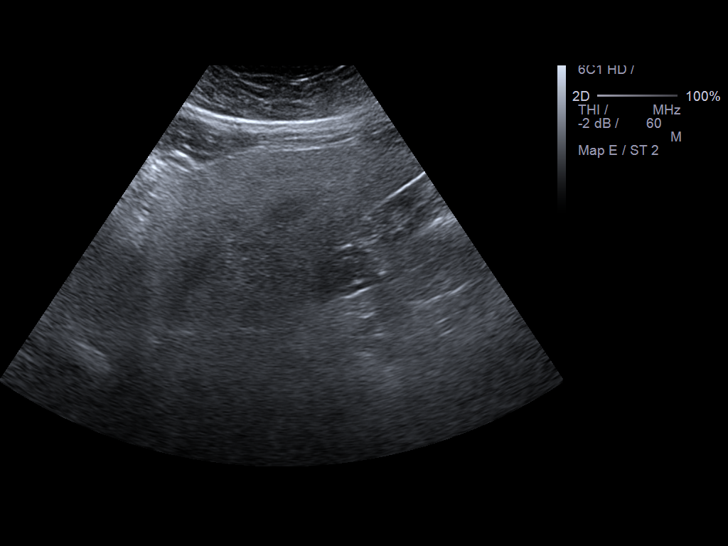
[im 7/38]
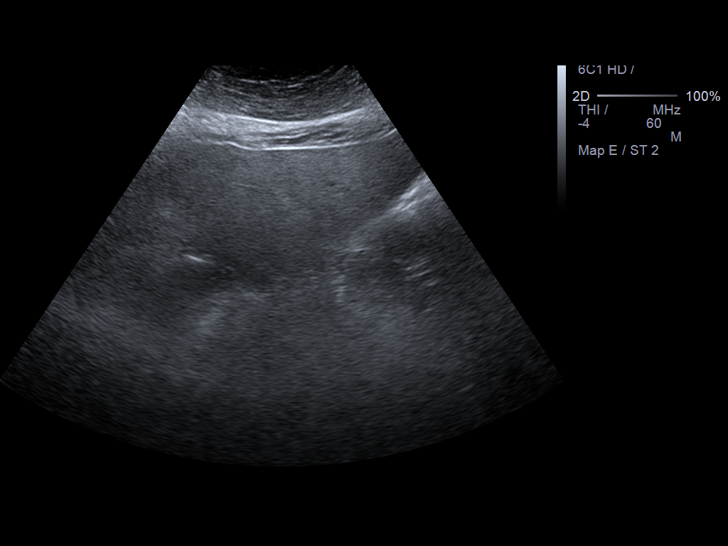
[im 10/38]
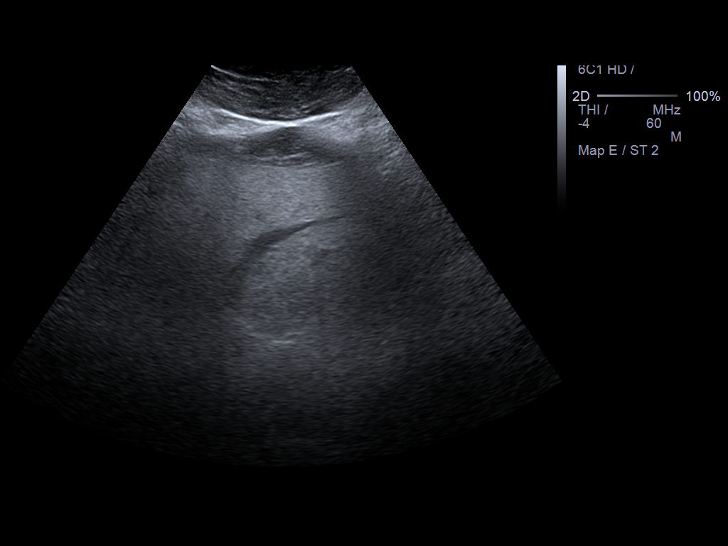
[im 13/38]
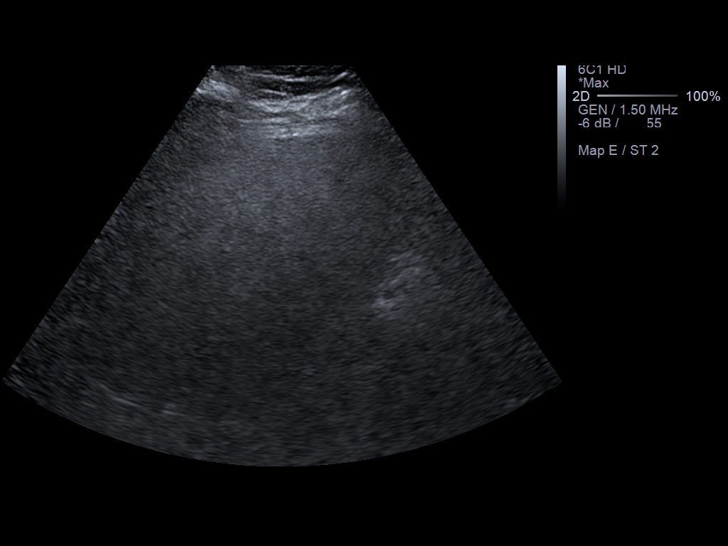
[im 14/38]
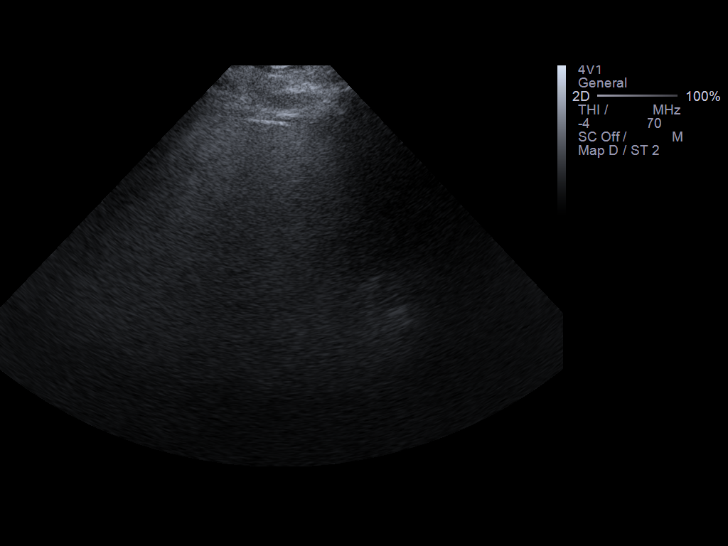
[im 17/38]
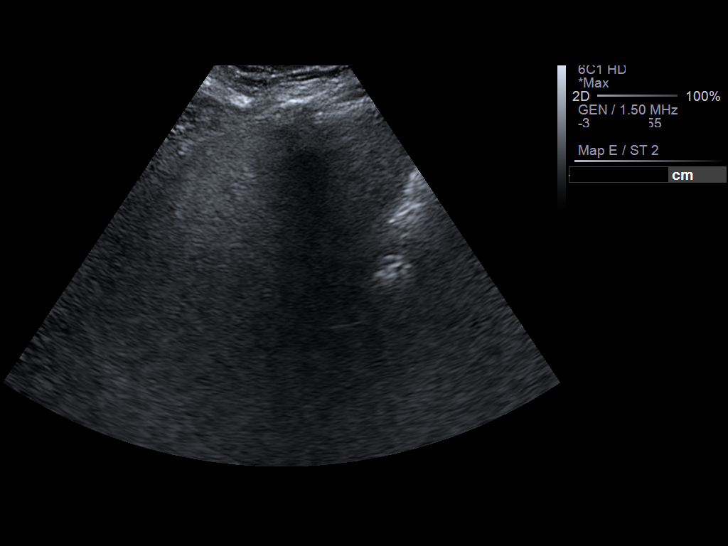
[im 21/38]
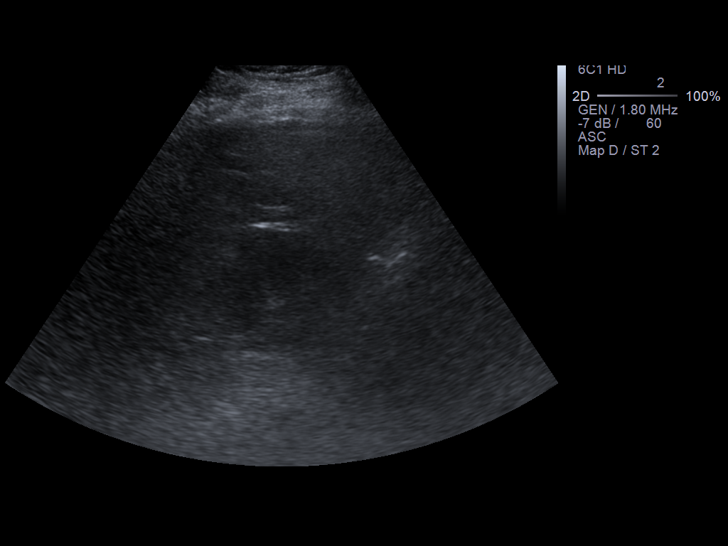
[im 24/38]
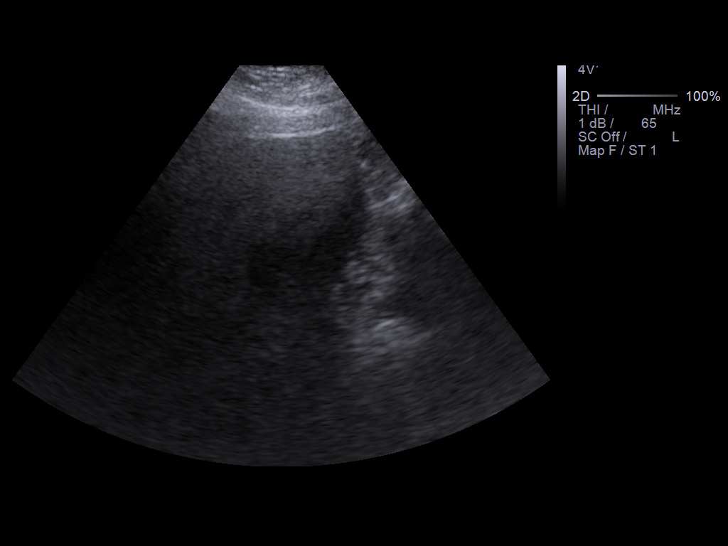
[im 25/38]
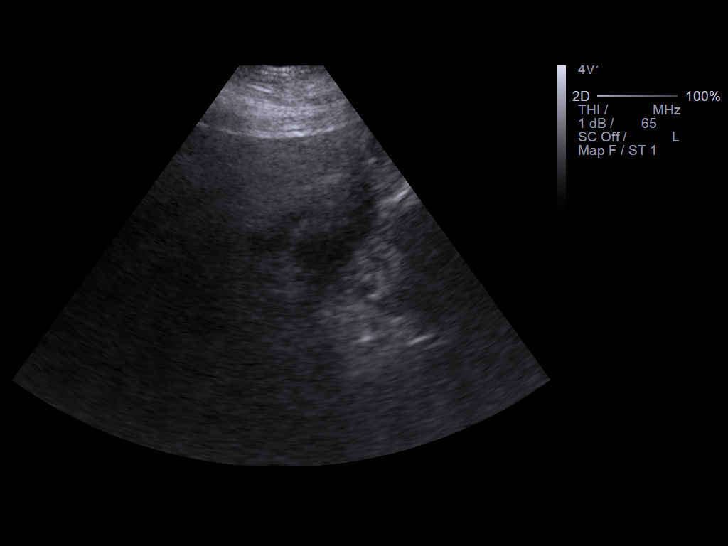
[im 28/38]
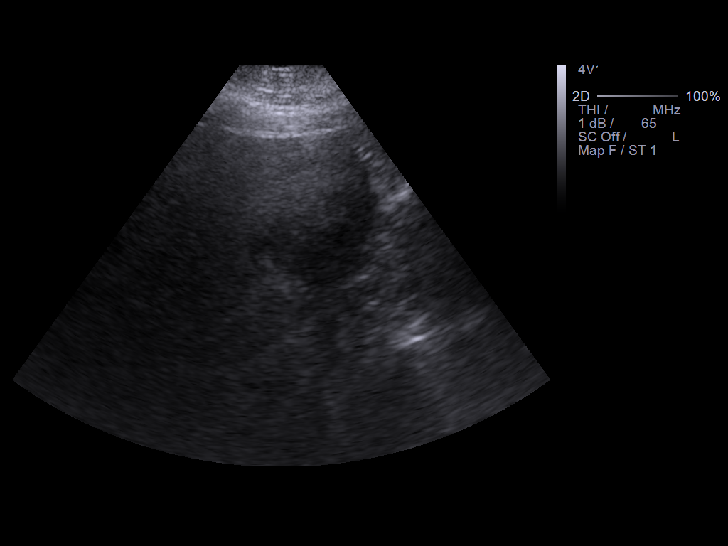
[im 31/38]
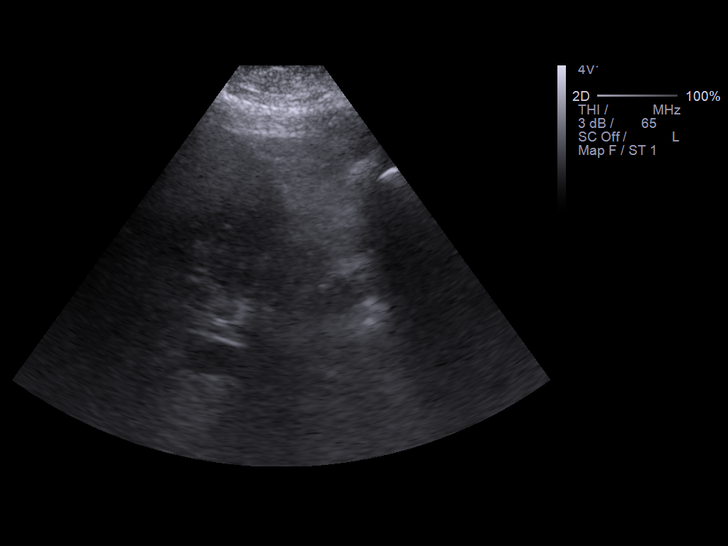
[im 34/38]
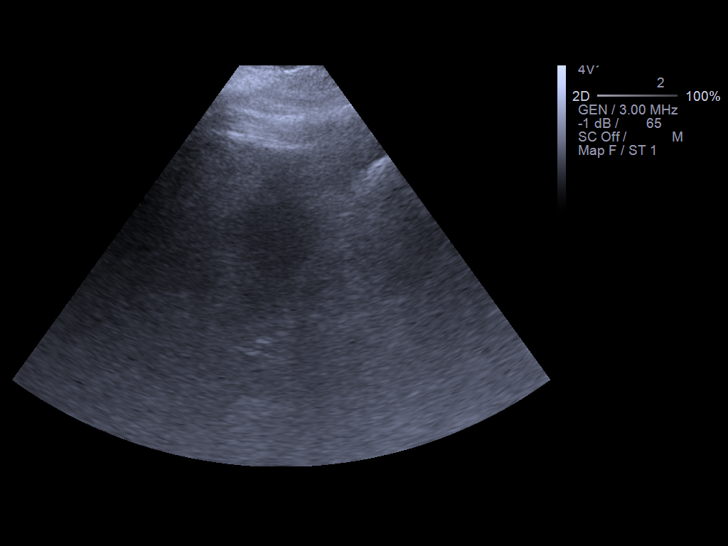
[im 38/38]
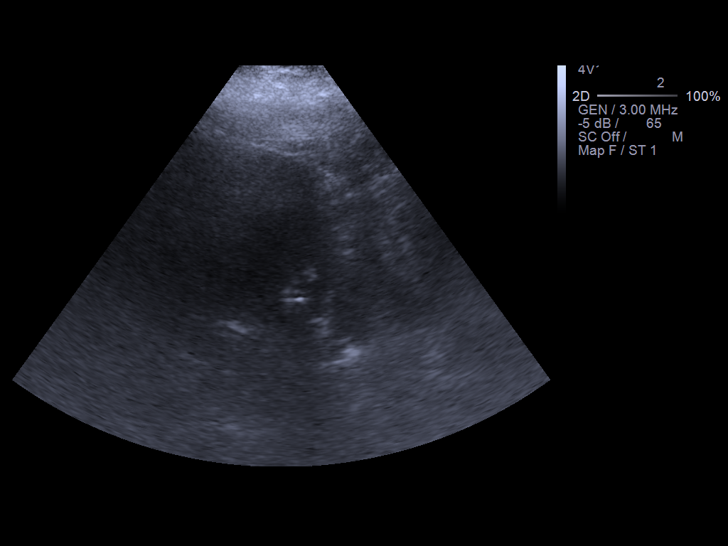

[14 of 25 positions shown; findings below may reference images not displayed]

FINDINGS: Gallbladder

Gallbladder is not adequately visualized for evaluation. This may be
due to incomplete distention however is largely due to obesity and
bowel gas.

Common bile duct

Diameter: 4.8 mm

Liver:

Limited scanning of the liver due to body habitus. Diffusely
hyperechoic liver due to fatty infiltration. No mass lesion is seen.
The liver appears enlarged measuring 20 cm.
IMPRESSION: Extremely limited exam due to body habitus. Gallbladder not
adequately evaluated.

Hepatomegaly with fatty infiltration.

## 2013-02-20 ENCOUNTER — Ambulatory Visit: Payer: Self-pay | Admitting: Surgery

## 2013-03-05 ENCOUNTER — Ambulatory Visit: Payer: Self-pay | Admitting: Surgery

## 2013-04-25 ENCOUNTER — Ambulatory Visit: Payer: Self-pay | Admitting: Surgery

## 2013-05-06 ENCOUNTER — Ambulatory Visit: Payer: Self-pay | Admitting: Surgery

## 2013-11-06 ENCOUNTER — Ambulatory Visit: Payer: Self-pay | Admitting: Surgery

## 2014-07-27 NOTE — Op Note (Signed)
PATIENT NAME:  James Sanford, James Sanford MR#:  [id number] DATE OF BIRTH:  Jun 23, 1955  DATE OF PROCEDURE:  11/06/2013  PREOPERATIVE DIAGNOSIS: Right inguinal hernia.   POSTOPERATIVE DIAGNOSIS: Right inguinal hernia.   PROCEDURE: Right inguinal hernia repair.   SURGEON: Adella Hare, MD   ANESTHESIA: General.   INDICATIONS: This 59 year old male with a history of morbid obesity has had bariatric surgery and lost a large amount of weight and has begun to notice bulging in the right groin.  A right inguinal hernia was demonstrated on physical exam, and repair was recommended for definitive treatment.   DESCRIPTION OF PROCEDURE: The patient was placed on the operating table in the supine position under general anesthesia. The right lower abdomen was prepared with ChloraPrep and draped in a sterile manner.  A transversely oriented right suprapubic incision was made, carried down through subcutaneous tissues. There was a large traversing vein which was divided between 4-0 Vicryl suture ligatures. Another artery and vein were also divided between 4-0 Vicryl suture ligatures. Several small bleeding points were cauterized. The Scarpa fascia was incised. Dissection was carried down to the external oblique aponeurosis. The location of the external ring was identified. The external ring was large in size. The external oblique aponeurosis was incised along the course of its fibers to open the external ring and expose the inguinal cord structures. There was a large amount of fatty tissue within the cord structures which were mobilized. A Penrose drain was passed around the cord structures. There was a large direct inguinal hernia which was just medial to the inferior epigastric vessels, which was dissected free from the surrounding structures. The sac was some 8 inches in length, and the sac was opened and found that there was a sliding-type hernia, and that portion of the sac was made up of bowel wall. Approximately  three-fourths of the sac was removed by placing a 0 Surgilon pursestring suture, and it was doubly ligated and divided, and the sac was removed and was not sent for pathology. There was some subsequent bleeding from the stump and subsequently did apply an additional 0 Surgilon suture ligature, and hemostasis subsequently appeared to be intact. There was another indirect hernia sac which was smaller and dissected free from surrounding structures up to the internal ring and was also ligated with 0 Surgilon suture ligature. There was also a cord lipoma dissected free from the surrounding structures, controlled with 4-0 Vicryl suture ligature and amputated. None of these tissues were sent for pathology. The cord structures were further retracted, exposing the floor of the inguinal canal. The repair was carried out with a row of 0 Surgilon sutures beginning at the pubic tubercle, suturing the conjoined tendon to the Cooper ligament with transition stitch onto the shelving edge of the inguinal ligament, and this was carried up to the internal ring, and the last stitch led to satisfactory narrowing of the internal ring. Next, an onlay Bard soft mesh was cut to create an oval shape of some 3 x 6 cm, and a notch was cut out to straddle the internal ring. The mesh was placed along the floor of the inguinal canal and carried around the internal ring.  It was sutured to the repair with 0 Surgilon sutures, also sutured medially to the fascia and sutured on both sides of the internal ring. The repair looked good. Hemostasis was intact. Cord structures were replaced along the floor of the inguinal canal. Cut edges of the external oblique aponeurosis were closed with  running 4-0 Vicryl. The deep fascia superior and lateral to the repair site was infiltrated with 0.5% Sensorcaine with epinephrine. Subcutaneous tissues were infiltrated as well. Scarpa fascia was closed with interrupted 4-0 Vicryl. The skin was closed with running 4-0  Monocryl subcuticular suture and Dermabond. The testicle remained in the scrotum.  This case was significantly more difficult than the typical case related to his morbid obesity and sliding-type hernia, large defect, and the operation lasted approximately 2-1/2 hours.   ____________________________ Shela Commons. Renda Rolls, MD [email or web address] D: 11/06/2013 10:42:52 ET T: 11/06/2013 10:52:49 ET JOB#: [id number]  cc: Adella Hare, MD, <Dictator> Adella Hare MD ELECTRONICALLY SIGNED 11/15/2013 18:32

## 2017-10-29 ENCOUNTER — Emergency Department: Payer: BC Managed Care – PPO

## 2017-10-29 ENCOUNTER — Other Ambulatory Visit: Payer: Self-pay

## 2017-10-29 ENCOUNTER — Inpatient Hospital Stay
Admission: EM | Admit: 2017-10-29 | Discharge: 2017-10-31 | DRG: 511 | Disposition: A | Discharge status: Home / Self Care | Payer: BC Managed Care – PPO | Attending: Internal Medicine | Admitting: Internal Medicine

## 2017-10-29 ENCOUNTER — Encounter: Payer: Self-pay | Admitting: Emergency Medicine

## 2017-10-29 DIAGNOSIS — I10 Essential (primary) hypertension: Secondary | ICD-10-CM | POA: Diagnosis present

## 2017-10-29 DIAGNOSIS — G4730 Sleep apnea, unspecified: Secondary | ICD-10-CM | POA: Diagnosis present

## 2017-10-29 DIAGNOSIS — Z87891 Personal history of nicotine dependence: Secondary | ICD-10-CM | POA: Diagnosis not present

## 2017-10-29 DIAGNOSIS — E785 Hyperlipidemia, unspecified: Secondary | ICD-10-CM | POA: Diagnosis present

## 2017-10-29 DIAGNOSIS — S52501B Unspecified fracture of the lower end of right radius, initial encounter for open fracture type I or II: Principal | ICD-10-CM | POA: Diagnosis present

## 2017-10-29 DIAGNOSIS — S52601B Unspecified fracture of lower end of right ulna, initial encounter for open fracture type I or II: Secondary | ICD-10-CM | POA: Diagnosis present

## 2017-10-29 DIAGNOSIS — W010XXA Fall on same level from slipping, tripping and stumbling without subsequent striking against object, initial encounter: Secondary | ICD-10-CM | POA: Diagnosis present

## 2017-10-29 DIAGNOSIS — I4510 Unspecified right bundle-branch block: Secondary | ICD-10-CM | POA: Diagnosis present

## 2017-10-29 DIAGNOSIS — I4891 Unspecified atrial fibrillation: Secondary | ICD-10-CM | POA: Diagnosis present

## 2017-10-29 DIAGNOSIS — W108XXA Fall (on) (from) other stairs and steps, initial encounter: Secondary | ICD-10-CM | POA: Diagnosis present

## 2017-10-29 DIAGNOSIS — S5290XA Unspecified fracture of unspecified forearm, initial encounter for closed fracture: Secondary | ICD-10-CM | POA: Diagnosis present

## 2017-10-29 DIAGNOSIS — Z96653 Presence of artificial knee joint, bilateral: Secondary | ICD-10-CM | POA: Diagnosis present

## 2017-10-29 DIAGNOSIS — Z9884 Bariatric surgery status: Secondary | ICD-10-CM | POA: Diagnosis not present

## 2017-10-29 DIAGNOSIS — E119 Type 2 diabetes mellitus without complications: Secondary | ICD-10-CM | POA: Diagnosis present

## 2017-10-29 DIAGNOSIS — I4892 Unspecified atrial flutter: Secondary | ICD-10-CM | POA: Diagnosis present

## 2017-10-29 HISTORY — DX: Essential (primary) hypertension: I10

## 2017-10-29 HISTORY — DX: Sleep apnea, unspecified: G47.30

## 2017-10-29 HISTORY — DX: Unspecified osteoarthritis, unspecified site: M19.90

## 2017-10-29 HISTORY — DX: Hyperlipidemia, unspecified: E78.5

## 2017-10-29 HISTORY — DX: Type 2 diabetes mellitus without complications: E11.9

## 2017-10-29 LAB — URINALYSIS, COMPLETE (UACMP) WITH MICROSCOPIC
Bilirubin Urine: NEGATIVE
GLUCOSE, UA: NEGATIVE mg/dL
Hgb urine dipstick: NEGATIVE
Ketones, ur: NEGATIVE mg/dL
NITRITE: NEGATIVE
PH: 5 (ref 5.0–8.0)
PROTEIN: NEGATIVE mg/dL
Specific Gravity, Urine: 1.02 (ref 1.005–1.030)

## 2017-10-29 LAB — CBC WITH DIFFERENTIAL/PLATELET
BASOS PCT: 1 %
Basophils Absolute: 0 10*3/uL (ref 0–0.1)
EOS ABS: 0 10*3/uL (ref 0–0.7)
EOS PCT: 0 %
HCT: 41.2 % (ref 40.0–52.0)
HEMOGLOBIN: 14.1 g/dL (ref 13.0–18.0)
LYMPHS ABS: 0.9 10*3/uL — AB (ref 1.0–3.6)
Lymphocytes Relative: 10 %
MCH: 33.4 pg (ref 26.0–34.0)
MCHC: 34.3 g/dL (ref 32.0–36.0)
MCV: 97.5 fL (ref 80.0–100.0)
MONOS PCT: 6 %
Monocytes Absolute: 0.5 10*3/uL (ref 0.2–1.0)
Neutro Abs: 7 10*3/uL — ABNORMAL HIGH (ref 1.4–6.5)
Neutrophils Relative %: 83 %
PLATELETS: 168 10*3/uL (ref 150–440)
RBC: 4.22 MIL/uL — ABNORMAL LOW (ref 4.40–5.90)
RDW: 14.1 % (ref 11.5–14.5)
WBC: 8.4 10*3/uL (ref 3.8–10.6)

## 2017-10-29 LAB — COMPREHENSIVE METABOLIC PANEL
ALT: 27 U/L (ref 0–44)
AST: 38 U/L (ref 15–41)
Albumin: 4.1 g/dL (ref 3.5–5.0)
Alkaline Phosphatase: 94 U/L (ref 38–126)
Anion gap: 7 (ref 5–15)
BILIRUBIN TOTAL: 1.1 mg/dL (ref 0.3–1.2)
BUN: 12 mg/dL (ref 6–20)
CHLORIDE: 107 mmol/L (ref 98–111)
CO2: 29 mmol/L (ref 22–32)
CREATININE: 0.63 mg/dL (ref 0.61–1.24)
Calcium: 8.3 mg/dL — ABNORMAL LOW (ref 8.9–10.3)
Glucose, Bld: 169 mg/dL — ABNORMAL HIGH (ref 70–99)
POTASSIUM: 4.3 mmol/L (ref 3.5–5.1)
Sodium: 143 mmol/L (ref 135–145)
Total Protein: 7.1 g/dL (ref 6.5–8.1)

## 2017-10-29 LAB — MRSA PCR SCREENING: MRSA BY PCR: NEGATIVE

## 2017-10-29 LAB — ETHANOL: Alcohol, Ethyl (B): <10 mg/dL (ref <10)

## 2017-10-29 IMAGING — CT CT HEAD W/O CM
3 series · 15 of 47 positions shown, 18 images · non-contrast
Comparison: None.

CLINICAL DATA: 62-year-old male with acute head injury following
fall last night. Initial encounter.

EXAM:
CT HEAD WITHOUT CONTRAST
TECHNIQUE: Contiguous axial images were obtained from the base of the skull
through the vertex without intravenous contrast.

[Series 2: head wo · axial · 0.47mm/px · z∈[-125,-0]mm · 9 of 30 slices shown, 12 images]
[im 3/30  brain]
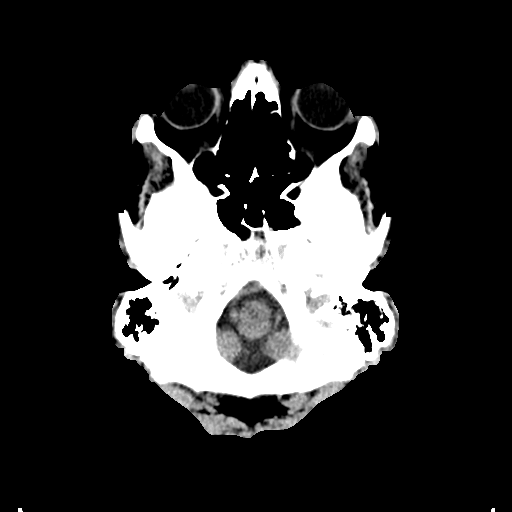
[im 3/30  bone]
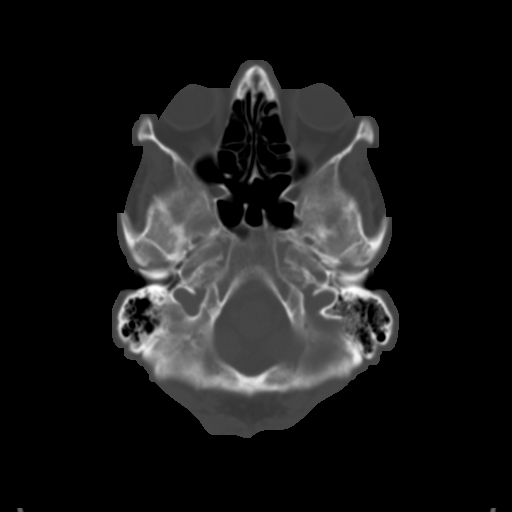
[im 6/30  brain]
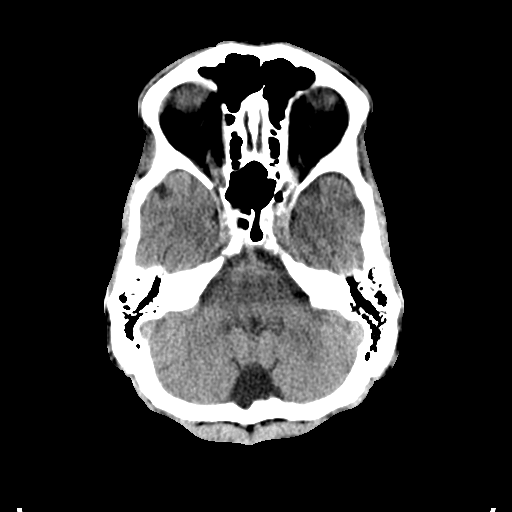
[im 9/30  brain]
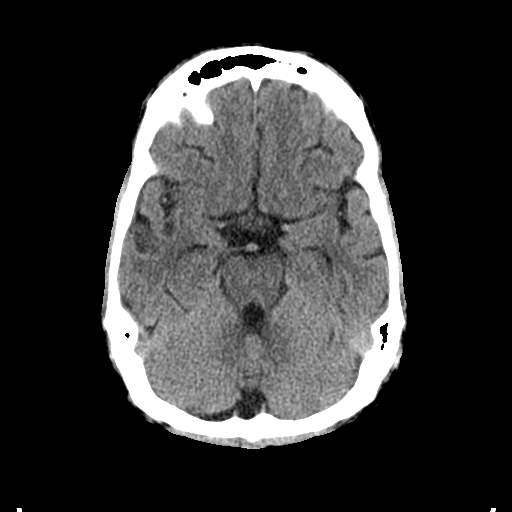
[im 12/30  brain]
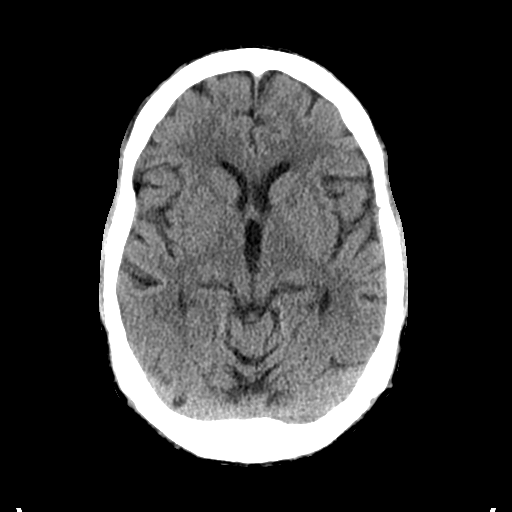
[im 16/30  brain]
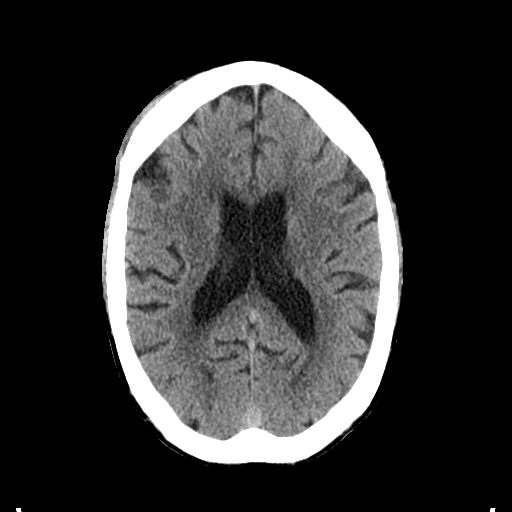
[im 16/30  bone]
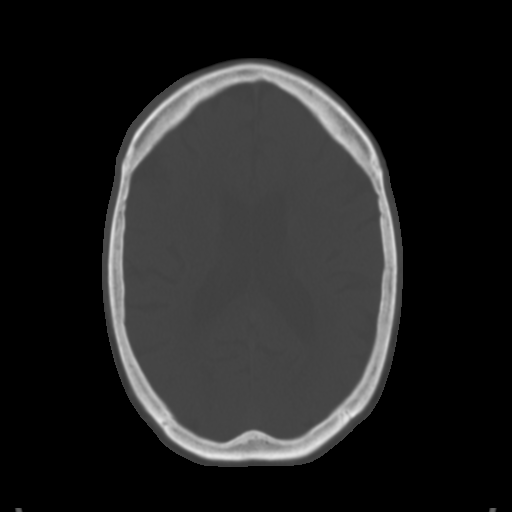
[im 19/30  brain]
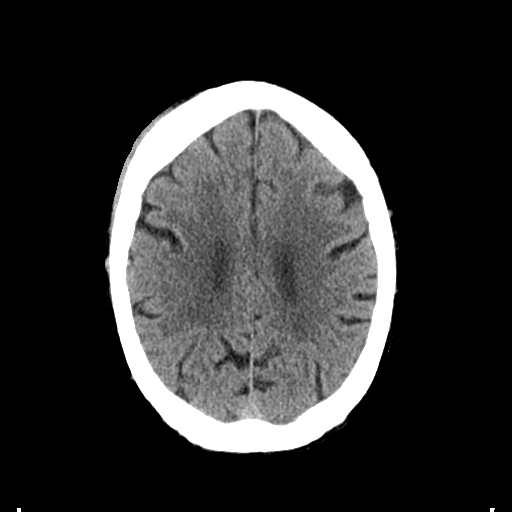
[im 22/30  brain]
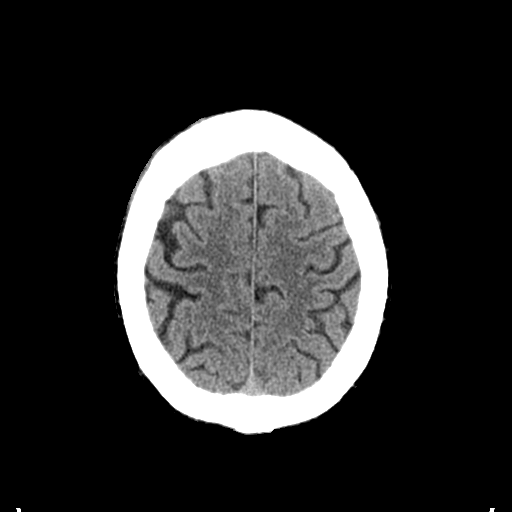
[im 25/30  brain]
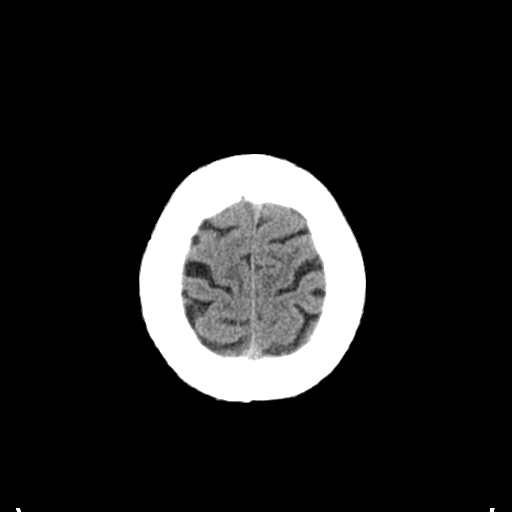
[im 28/30  brain]
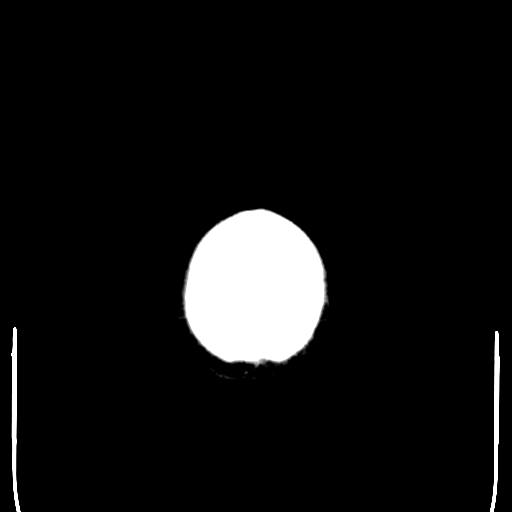
[im 28/30  bone]
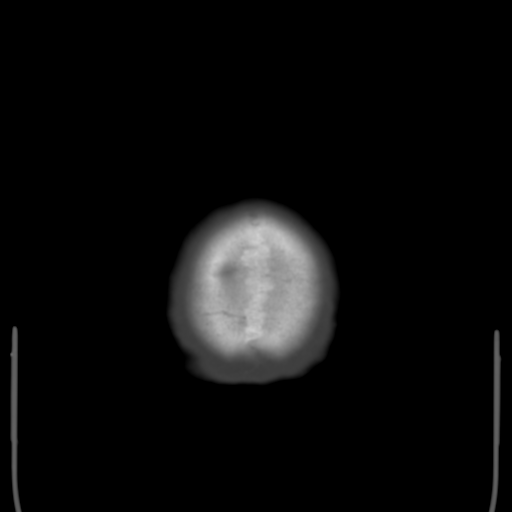

[Series 4: coronal soft tissue · coronal · 0.29mm/px · 3 of 67 slices shown]
[im 23/67  brain]
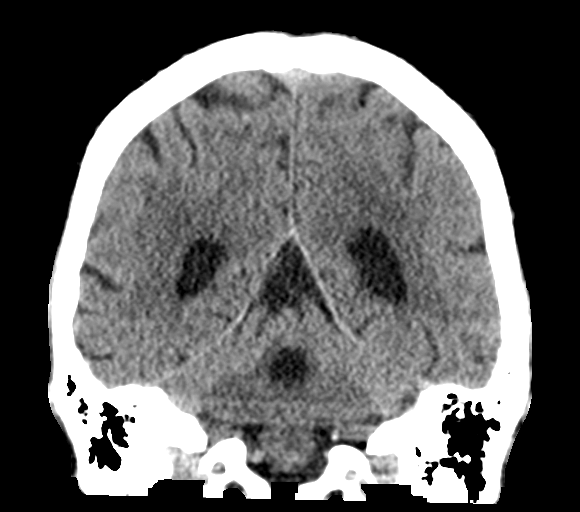
[im 30/67  brain]
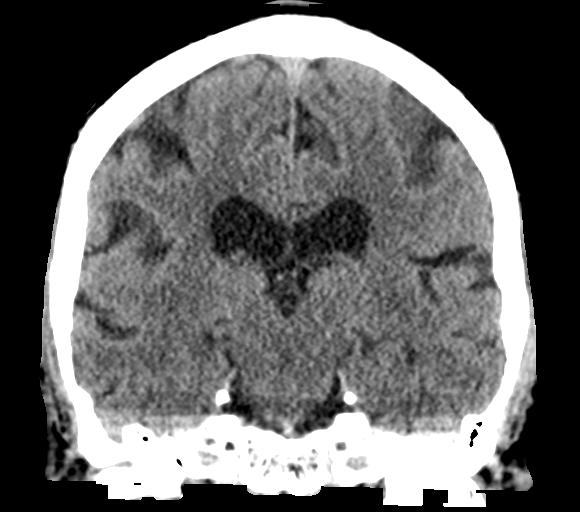
[im 37/67  brain]
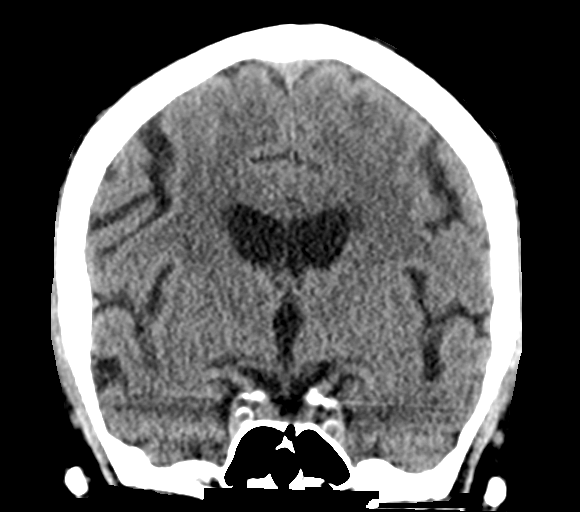

[Series 5: sagittal soft tissue · sagittal · 0.29mm/px · 3 of 57 slices shown]
[im 19/57  brain]
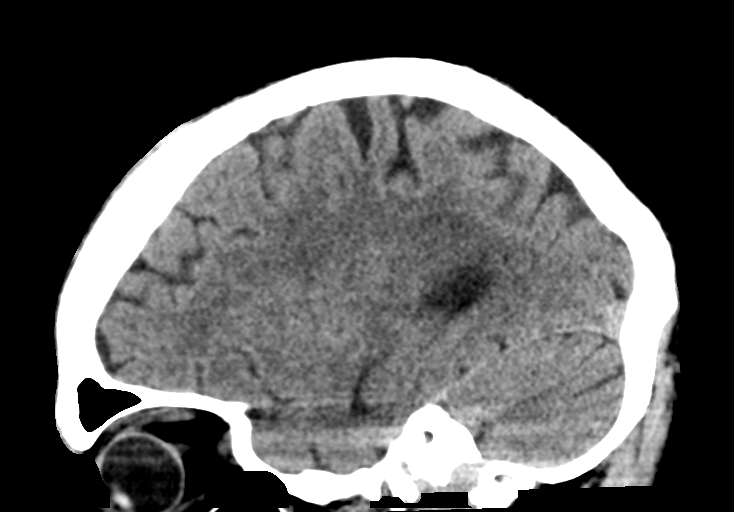
[im 29/57  brain]
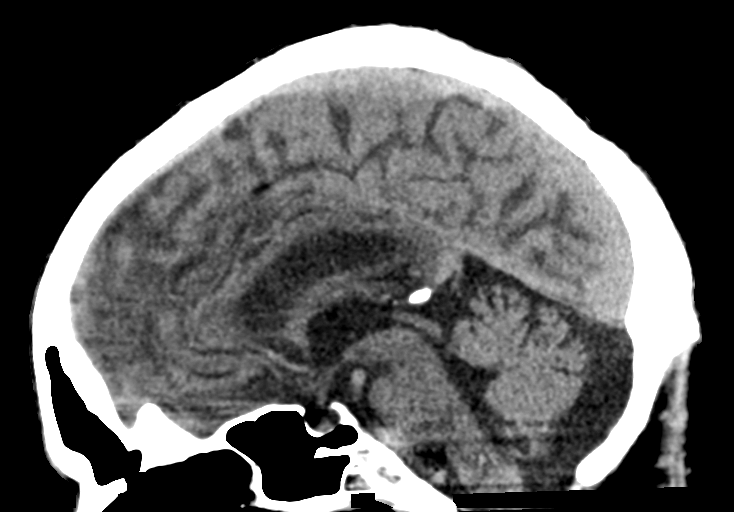
[im 38/57  brain]
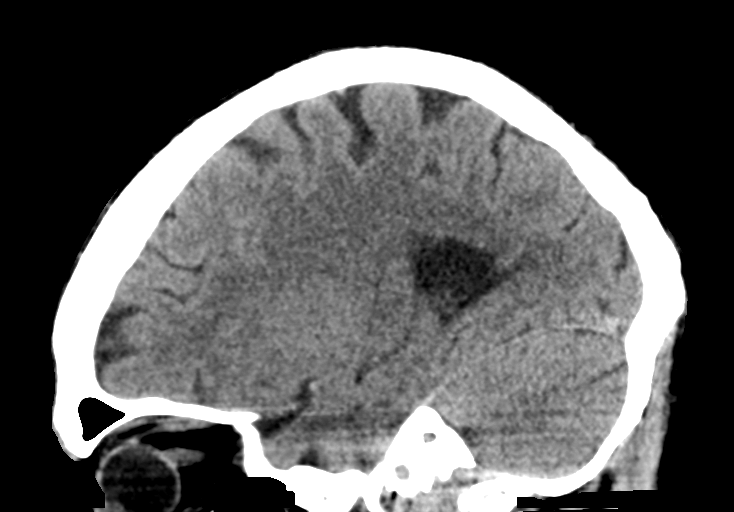

[15 of 47 positions shown; findings below may reference images not displayed]

FINDINGS: Brain: No evidence of acute infarction, hemorrhage, hydrocephalus,
extra-axial collection or mass lesion/mass effect.

Mild atrophy and mild probable chronic small-vessel white matter
ischemic changes noted.

Vascular: Intracranial carotid atherosclerotic calcifications noted.

Skull: Normal. Negative for fracture or focal lesion.

Sinuses/Orbits: No acute finding.

Other: Mild RIGHT anterior scalp soft tissue swelling noted.
IMPRESSION: 1. No evidence of acute intracranial abnormality.
2. RIGHT anterior scalp soft tissue swelling without underlying
fracture.
3. Mild atrophy and mild probable chronic small-vessel white matter
ischemic changes.

## 2017-10-29 IMAGING — CR DG ELBOW COMPLETE 3+V*R*
1 series · 4 of 4 positions shown · non-contrast
Comparison: None.

CLINICAL DATA: Recent fall while walking dog

EXAM:
RIGHT ELBOW - COMPLETE 3+ VIEW

[Series 1: dg elbow complete right (3+view) · 0.14mm/px · 4 of 4 slices shown]
[im 1/4]
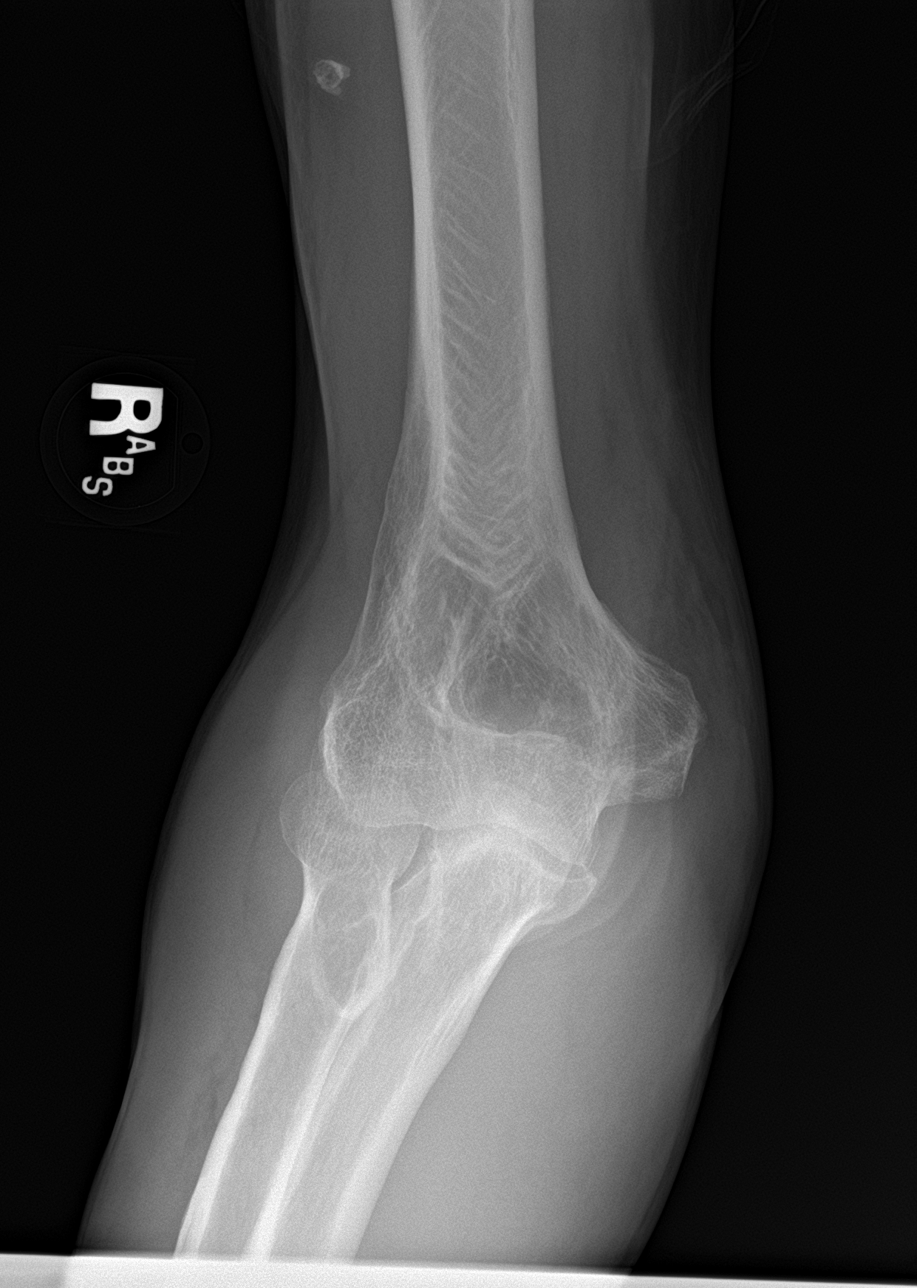
[im 2/4]
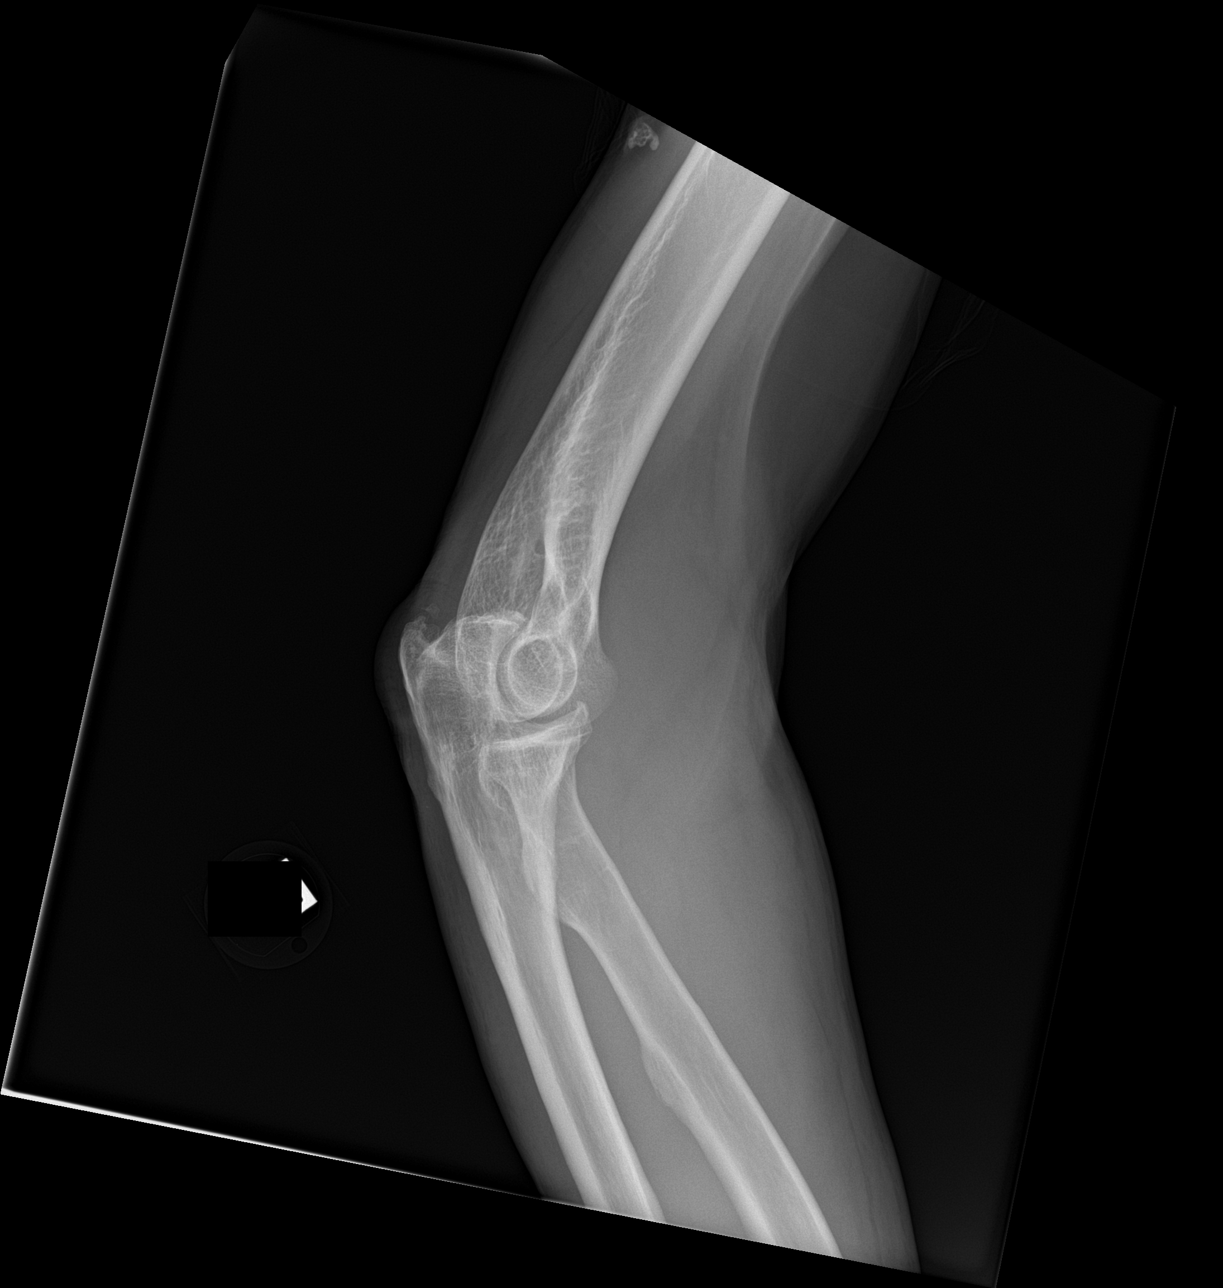
[im 3/4]
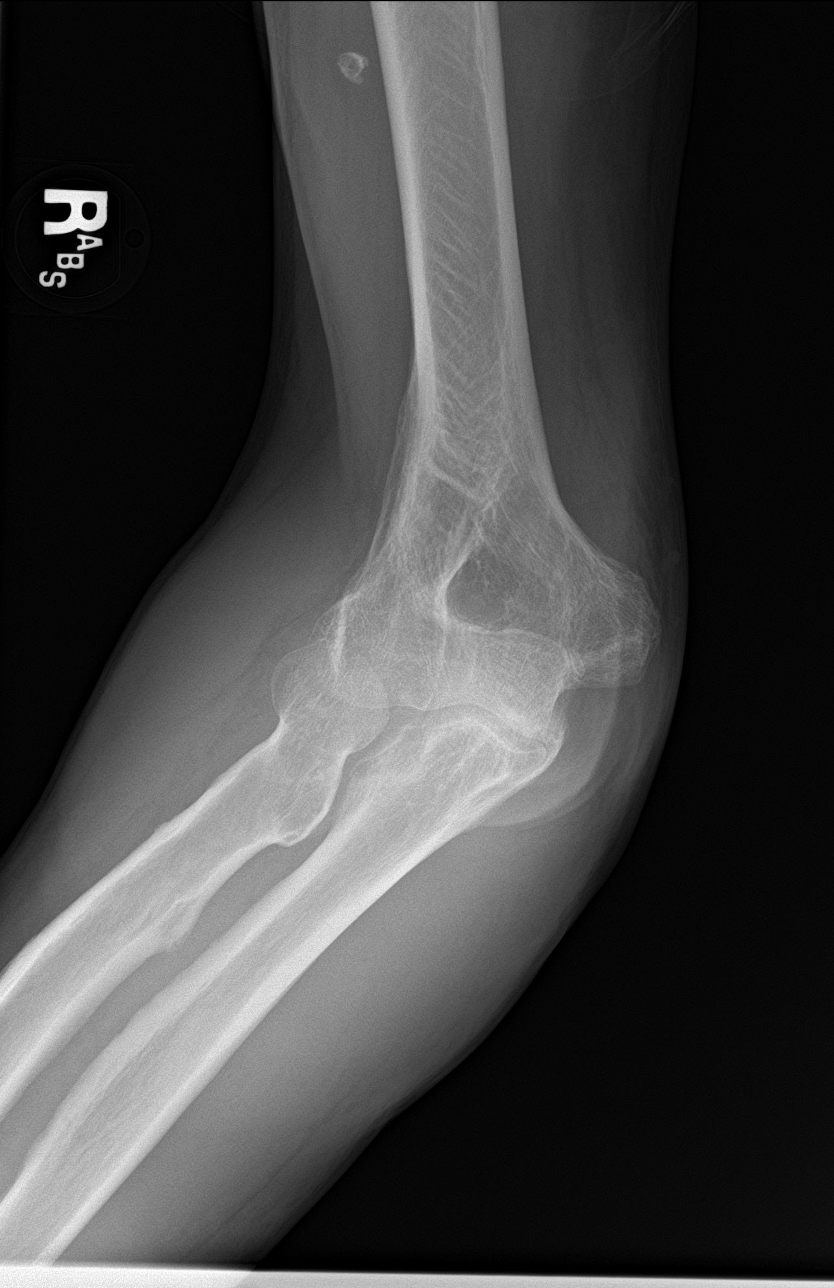
[im 4/4]
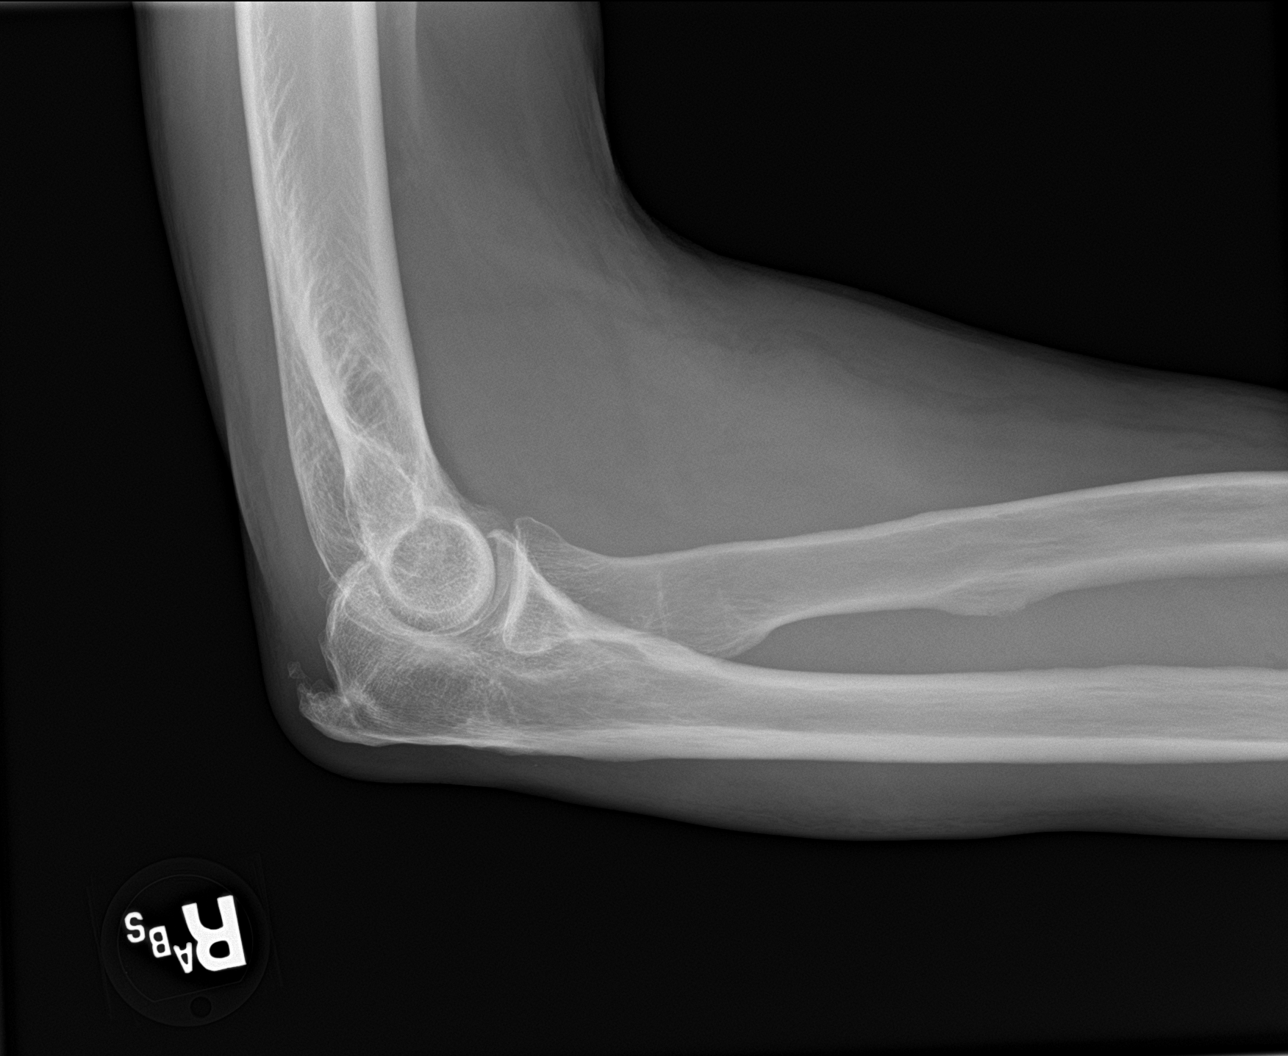

[4 of 4 positions shown; findings below may reference images not displayed]

FINDINGS: Degenerative changes about the elbow joint are noted. Olecranon
spurring is seen. No acute fracture or dislocation is noted.
IMPRESSION: Degenerative change without acute abnormality.

## 2017-10-29 IMAGING — DX DG WRIST COMPLETE 3+V*R*
3 series · 3 of 3 positions shown · non-contrast
Comparison: None.

CLINICAL DATA: Recent fall with wrist pain, initial encounter

EXAM:
RIGHT WRIST - COMPLETE 3+ VIEW

[wrist ap]
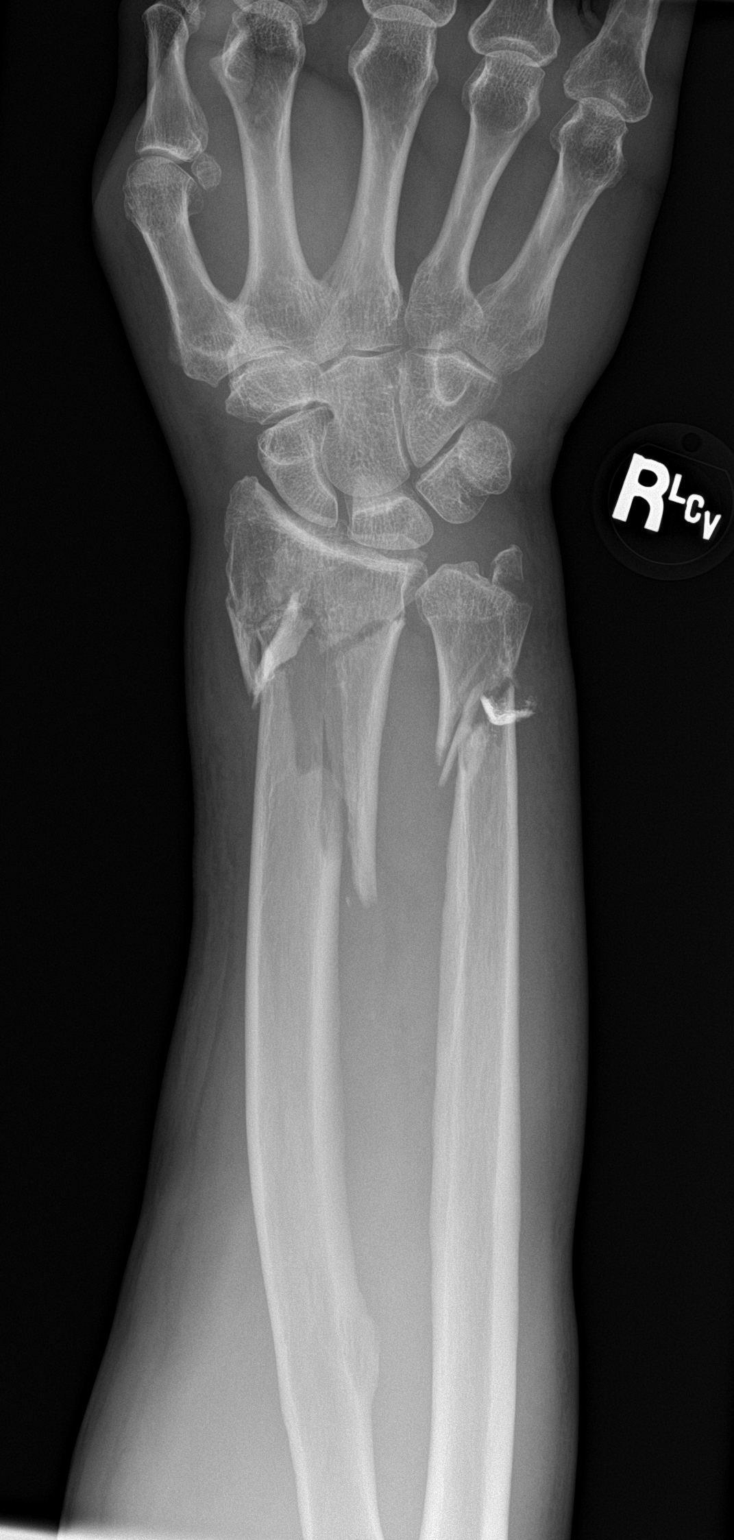

[wrist obl]
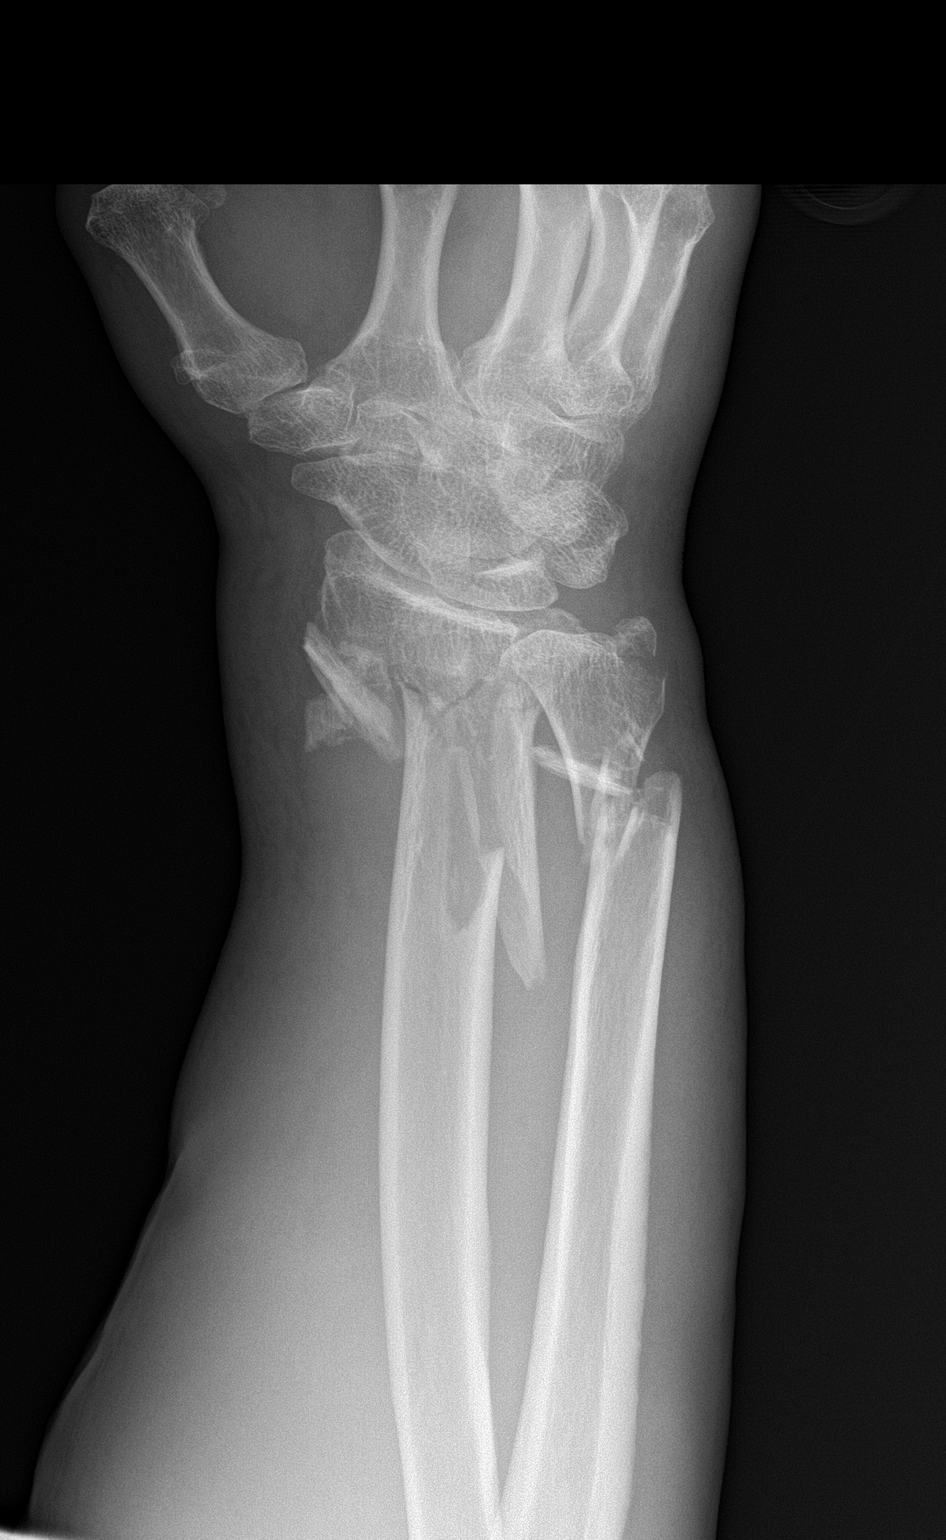

[wrist lat]
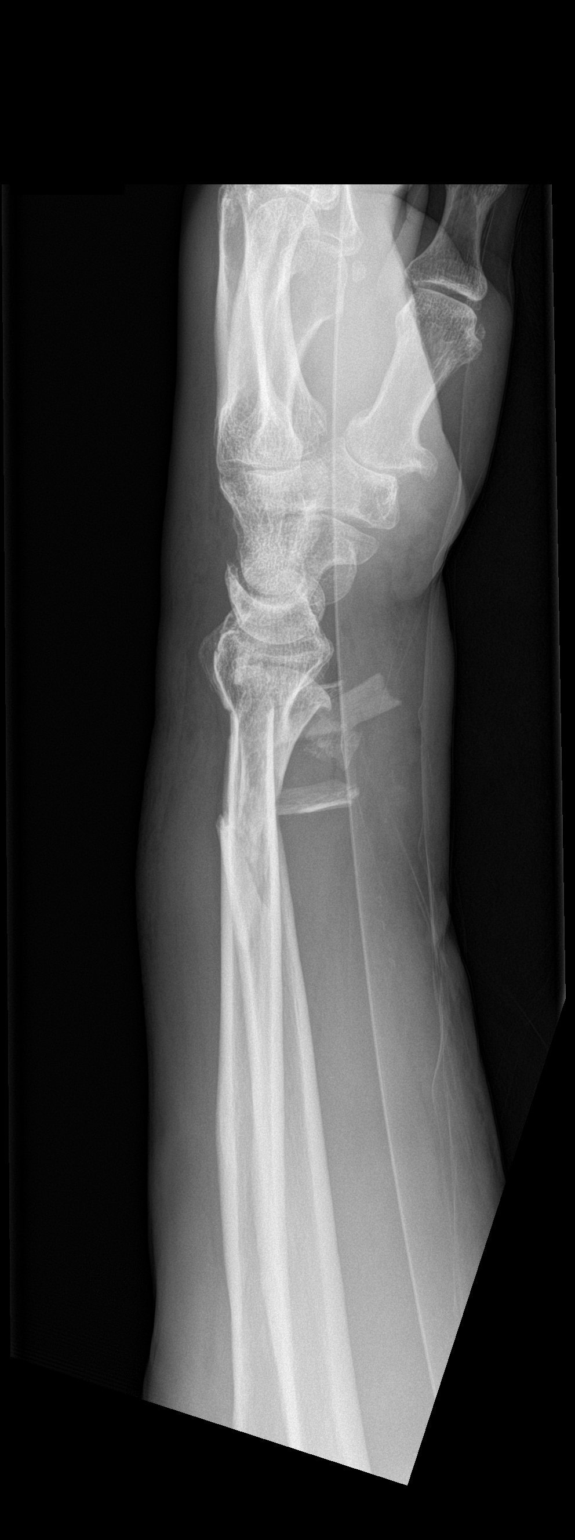

[3 of 3 positions shown; findings below may reference images not displayed]

FINDINGS: Comminuted distal radial and ulnar fractures are noted. Some
impaction and displacement of the fracture fragments at the fracture
site is noted. There appears to be some articular extension in the
distal radius. Considerable soft tissue deformity is noted.
IMPRESSION: Comminuted distal radial and ulnar fractures with apparent
intra-articular involvement in the distal radius. Multiple displaced
fractures are noted anteriorly.

## 2017-10-29 MED ORDER — SODIUM CHLORIDE 0.9 % IV BOLUS
1000.0000 mL | Freq: Once | INTRAVENOUS | Status: AC
  Administered 2017-10-29: 1000 mL via INTRAVENOUS

## 2017-10-29 MED ORDER — OXYCODONE HCL 5 MG PO TABS
5.0000 mg | ORAL_TABLET | ORAL | Status: DC | PRN
  Administered 2017-10-30: 5 mg via ORAL
  Filled 2017-10-29: qty 1

## 2017-10-29 MED ORDER — ACETAMINOPHEN 325 MG PO TABS: 650.0000 mg | ORAL_TABLET | Freq: Four times a day (QID) | ORAL | Status: DC | PRN

## 2017-10-29 MED ORDER — ACETAMINOPHEN 650 MG RE SUPP: 650.0000 mg | Freq: Four times a day (QID) | RECTAL | Status: DC | PRN

## 2017-10-29 MED ORDER — TETANUS-DIPHTH-ACELL PERTUSSIS 5-2.5-18.5 LF-MCG/0.5 IM SUSP
0.5000 mL | Freq: Once | INTRAMUSCULAR | Status: AC
  Administered 2017-10-29: 0.5 mL via INTRAMUSCULAR
  Filled 2017-10-29: qty 0.5

## 2017-10-29 MED ORDER — ONDANSETRON HCL 4 MG/2ML IJ SOLN
4.0000 mg | Freq: Once | INTRAMUSCULAR | Status: AC
  Administered 2017-10-29: 4 mg via INTRAVENOUS
  Filled 2017-10-29: qty 2

## 2017-10-29 MED ORDER — CEFAZOLIN SODIUM-DEXTROSE 1-4 GM/50ML-% IV SOLN
1.0000 g | Freq: Once | INTRAVENOUS | Status: AC
  Administered 2017-10-29: 1 g via INTRAVENOUS
  Filled 2017-10-29: qty 50

## 2017-10-29 MED ORDER — MORPHINE SULFATE (PF) 2 MG/ML IV SOLN
2.0000 mg | INTRAVENOUS | Status: DC | PRN
  Administered 2017-10-30 (×2): 2 mg via INTRAVENOUS
  Filled 2017-10-29 (×3): qty 1

## 2017-10-29 MED ORDER — ONDANSETRON HCL 4 MG/2ML IJ SOLN
4.0000 mg | Freq: Four times a day (QID) | INTRAMUSCULAR | Status: DC | PRN
  Administered 2017-10-30: 4 mg via INTRAVENOUS

## 2017-10-29 MED ORDER — SODIUM CHLORIDE 0.9 % IV SOLN
INTRAVENOUS | Status: DC
  Administered 2017-10-29 – 2017-10-30 (×2): via INTRAVENOUS

## 2017-10-29 MED ORDER — MORPHINE SULFATE (PF) 4 MG/ML IV SOLN
4.0000 mg | Freq: Once | INTRAVENOUS | Status: AC
  Administered 2017-10-29: 4 mg via INTRAVENOUS
  Filled 2017-10-29: qty 1

## 2017-10-29 MED ORDER — ONDANSETRON HCL 4 MG PO TABS: 4.0000 mg | ORAL_TABLET | Freq: Four times a day (QID) | ORAL | Status: DC | PRN

## 2017-10-29 NOTE — Consult Note (Signed)
ORTHOPAEDIC CONSULTATION  PATIENT NAME: James Sanford DOB: 26-Jul-1955  MRN: [id number]  REQUESTING PHYSICIAN: Enid Baas, MD  Chief Complaint: 62 years old male status post fall with comminuted right distal radius fracture.  HPI: James Sanford is a 62 y.o. male who complains of fall last night whilehfall last night.  Patient does not exactly remember the circumstances of his fall. He says that he got up in the middle of the night to take his dog out and slipped and fell on concrete steps. Patient has history of gastric bypass surgery 4 years ago after which he lost 230 pounds. He states that since his surgery he has urgency in his bowel movements. He denies pain anywhere else other than his right wrist.  Past Medical History:  Diagnosis Date  . Arthritis   . Diabetes mellitus without complication (HCC)   . Hyperlipemia   . Hypertension   . Sleep apnea    Past Surgical History:  Procedure Laterality Date  . GASTRIC BYPASS    . HERNIA REPAIR    . JOINT REPLACEMENT Bilateral    Knee surgery   Social History   Socioeconomic History  . Marital status: Married    Spouse name: Not on file  . Number of children: Not on file  . Years of education: Not on file  . Highest education level: Not on file  Occupational History  . Not on file  Social Needs  . Financial resource strain: Not on file  . Food insecurity:    Worry: Not on file    Inability: Not on file  . Transportation needs:    Medical: Not on file    Non-medical: Not on file  Tobacco Use  . Smoking status: Never Smoker  . Smokeless tobacco: Former Neurosurgeon    Types: Chew  . Tobacco comment: Luellen Pucker about 4 years ago  Substance and Sexual Activity  . Alcohol use: Yes    Comment: beer occasionally  . Drug use: Not Currently  . Sexual activity: Not on file  Lifestyle  . Physical activity:    Days per week: Not on file    Minutes per session: Not on file  . Stress: Not on file  Relationships   . Social connections:    Talks on phone: Not on file    Gets together: Not on file    Attends religious service: Not on file    Active member of club or organization: Not on file    Attends meetings of clubs or organizations: Not on file    Relationship status: Not on file  Other Topics Concern  . Not on file  Social History Narrative   Lives at home with his wife. Independent baseline   Family History  Problem Relation Age of Onset  . Stroke Mother   . Suicidality Father    No Known Allergies Prior to Admission medications   Not on File   Dg Elbow Complete Right  Result Date: 10/29/2017 CLINICAL DATA:  Recent fall while walking dog EXAM: RIGHT ELBOW - COMPLETE 3+ VIEW COMPARISON:  None. FINDINGS: Degenerative changes about the elbow joint are noted. Olecranon spurring is seen. No acute fracture or dislocation is noted. IMPRESSION: Degenerative change without acute abnormality. Electronically Signed   By: Alcide Clever M.D.   On: 10/29/2017 13:14   Dg Wrist Complete Right  Result Date: 10/29/2017 CLINICAL DATA:  Recent fall with wrist pain, initial encounter EXAM: RIGHT WRIST - COMPLETE 3+ VIEW COMPARISON:  None. FINDINGS:  Comminuted distal radial and ulnar fractures are noted. Some impaction and displacement of the fracture fragments at the fracture site is noted. There appears to be some articular extension in the distal radius. Considerable soft tissue deformity is noted. IMPRESSION: Comminuted distal radial and ulnar fractures with apparent intra-articular involvement in the distal radius. Multiple displaced fractures are noted anteriorly. Electronically Signed   By: Alcide Clever M.D.   On: 10/29/2017 12:44   Ct Head Wo Contrast  Result Date: 10/29/2017 CLINICAL DATA:  62 year old male with acute head injury following fall last night. Initial encounter. EXAM: CT HEAD WITHOUT CONTRAST TECHNIQUE: Contiguous axial images were obtained from the base of the skull through the vertex  without intravenous contrast. COMPARISON:  None. FINDINGS: Brain: No evidence of acute infarction, hemorrhage, hydrocephalus, extra-axial collection or mass lesion/mass effect. Mild atrophy and mild probable chronic small-vessel white matter ischemic changes noted. Vascular: Intracranial carotid atherosclerotic calcifications noted. Skull: Normal. Negative for fracture or focal lesion. Sinuses/Orbits: No acute finding. Other: Mild RIGHT anterior scalp soft tissue swelling noted. IMPRESSION: 1. No evidence of acute intracranial abnormality. 2. RIGHT anterior scalp soft tissue swelling without underlying fracture. 3. Mild atrophy and mild probable chronic small-vessel white matter ischemic changes. Electronically Signed   By: Harmon Pier M.D.   On: 10/29/2017 12:59    Positive ROS: All other systems have been reviewed and were otherwise negative with the exception of those mentioned in the HPI and as above.  Physical Exam: General: Well developed, well nourished male seen in no acute distress. HEENT: Atraumatic and normocephalic. Sclera are clear. Extraocular motion is intact. Oropharynx is clear with moist mucosa. Neck: Supple, nontender, good range of motion. No JVD or carotid bruits. Lungs: Clear to auscultation bilaterally. Cardiovascular: Regular rate and rhythm with normal S1 and S2. No murmurs. No gallops or rubs. Pedal pulses are palpable bilaterally. Homans test is negative bilaterally. No significant pretibial or ankle edema. Abdomen: Soft, nontender, and nondistended. Bowel sounds are present. Skin: No lesions in the area of chief complaint Neurologic: Awake, alert, and oriented. Sensory function is grossly intact. Motor strength is felt to be 5 over 5 bilaterally. No clonus or tremor. Good motor coordination. Lymphatic: No axillary or cervical lymphadenopathy  MUSCULOSKELETAL: right hand shows dorsal and volar swelling that extends from the hand into the fingers. He maintains intact flexion  and extension of all the digits. Brisk capillary refill is present. Sensations are intact in radial median and ulnar nerve distribution. There is no pain with passive range of motion of the digits. Right upper extremity is splinted with a long-arm splint. The ER physician describes a 3-4 mm puncture wound on the volar aspect of the right forearm.  Assessment: 62 years old male status post fall with comminuted right distal radius fracture  Plan: She will be admitted to the hospitalist service. Patient will be scheduled for irrigation and debridement and open reduction internal fixation office right distal radius fracture. I had a detailed discussion with the patient. I described the procedure in detail as well as the postoperative recovery process. Risks and benefits were also discussed in detail. All the questions were answered to their satisfaction. Patient understands the risks include but are not limited to risk of infection,nonunion, implant related complications, need for further surgery, continued pain and stiffness.  Garnette Gunner, M.D.

## 2017-10-29 NOTE — ED Provider Notes (Signed)
[facility] Emergency Department Provider Note ____________________________________________  Time seen: Approximately 12:30 PM  I have reviewed the triage vital signs and the nursing notes.   HISTORY  Chief Complaint Fall    HPI James Sanford is a 62 y.o. male who presents to the emergency department for evaluation and treatment of right arm pain after a fall at some time during the night.  Patient states that he got up to take the dogs out and believes that he tripped and stretched his right arm out to catch his fall which caused this injury.  Patient states that he went back to bed and upon awakening this morning, noticed that there was blood all over the sheet.  Patient does not believe that he struck his head.  Family member states that he initially told her he did not recall going back to bed, but the patient now states that he does recall going back to bed.  Past Medical History:  Diagnosis Date  . Arthritis   . Diabetes mellitus without complication (HCC)   . Hyperlipemia   . Hypertension   . Sleep apnea     Patient Active Problem List   Diagnosis Date Noted  . Radius fracture 10/29/2017    Past Surgical History:  Procedure Laterality Date  . GASTRIC BYPASS    . HERNIA REPAIR    . JOINT REPLACEMENT Bilateral    Knee surgery    Prior to Admission medications   Not on File    Allergies Patient has no known allergies.  Family History  Problem Relation Age of Onset  . Stroke Mother   . Suicidality Father     Social History Social History   Tobacco Use  . Smoking status: Never Smoker  . Smokeless tobacco: Former Neurosurgeon    Types: Chew  . Tobacco comment: Luellen Pucker about 4 years ago  Substance Use Topics  . Alcohol use: Yes    Comment: beer occasionally  . Drug use: Not Currently    Review of Systems Constitutional: Negative for fever. Cardiovascular: Negative for chest pain. Respiratory: Negative for shortness of  breath. Musculoskeletal: Positive for right upper arm pain.  Skin: Positive for open wound on volar aspect of forearm.  Neurological: Negative for decrease in sensation  ____________________________________________   PHYSICAL EXAM:  VITAL SIGNS: ED Triage Vitals  Enc Vitals Group     BP 10/29/17 1148 (!) 143/96     Pulse Rate 10/29/17 1148 (!) 59     Resp 10/29/17 1148 14     Temp 10/29/17 1148 98.7 F (37.1 C)     Temp Source 10/29/17 1148 Oral     SpO2 10/29/17 1148 98 %     Weight 10/29/17 1148 180 lb (81.6 kg)     Height 10/29/17 1148 6' (1.829 m)     Head Circumference --      Peak Flow --      Pain Score 10/29/17 1154 0     Pain Loc --      Pain Edu? --      Excl. in GC? --     Constitutional: Alert and oriented. Well appearing and in no acute distress. Eyes: Conjunctivae are clear without discharge or drainage Head: Atraumatic Neck: Supple. No focal midline tenderness. Respiratory: No cough. Respirations are even and unlabored. Musculoskeletal: Deformity of the right forearm with diffuse edema from the elbow to the fingers. Neurologic: Motor and sensation of the right upper extremity is intact.  Skin: 3-4 mm  puncture wound noted on the volar aspect of the right forearm.  Psychiatric: Affect and behavior are appropriate.  ____________________________________________   LABS (all labs ordered are listed, but only abnormal results are displayed)  Labs Reviewed  CBC WITH DIFFERENTIAL/PLATELET - Abnormal; Notable for the following components:      Result Value   RBC 4.22 (*)    Neutro Abs 7.0 (*)    Lymphs Abs 0.9 (*)    All other components within normal limits  COMPREHENSIVE METABOLIC PANEL - Abnormal; Notable for the following components:   Glucose, Bld 169 (*)    Calcium 8.3 (*)    All other components within normal limits  URINALYSIS, COMPLETE (UACMP) WITH MICROSCOPIC  ETHANOL  VITAMIN B12    ____________________________________________  RADIOLOGY  Image of the right elbow is negative for acute abnormality per radiology.   ____________________________________________   PROCEDURES  .Splint Application Date/Time: 10/29/2017 4:39 PM Performed by: James Sanford, NT Authorized by: James Pester, FNP   Consent:    Consent obtained:  Verbal   Consent given by:  Patient   Risks discussed:  Pain and numbness Pre-procedure details:    Sensation:  Decreased circulation Procedure details:    Laterality:  Right   Location:  Arm   Arm:  R lower arm   Splint type:  Sugar tong   Supplies:  Cotton padding, Ortho-Glass, elastic bandage and sling Post-procedure details:    Pain:  Improved   Sensation:  Normal   Patient tolerance of procedure:  Tolerated well, no immediate complications    ____________________________________________   INITIAL IMPRESSION / ASSESSMENT AND PLAN / ED COURSE  James Sanford is a 62 y.o. who presents to the emergency department for evaluation after a fall last night. He is unable to provide specifics regarding approximate time or events just after the fall. He does recall tripping on the step and stretching his arm outward.   ----------------------------------------- 1:00 PM on 10/29/2017 -----------------------------------------  James Sanford states that he is having some difficulty with his memory and develops slurred speech in the evenings. She also states that he is having diarrhea and has not been to see James Sanford since it started. She is concerned that he is loosing too much weight. He has refused a colonoscopy. She voices concerns that he is not well, but he doesn't want to tell anyone.  ----------------------------------------- 1:51 PM on 10/29/2017 -----------------------------------------  Per Dr. Loralie Sanford, patient will require admission through the hospitalist and then he will plan for surgery at sometime  tomorrow.  ----------------------------------------- 2:21 PM on 10/29/2017 -----------------------------------------  Patient to be admitted.  Medications  sodium chloride 0.9 % bolus 1,000 mL (0 mLs Intravenous Stopped 10/29/17 1413)  ceFAZolin (ANCEF) IVPB 1 g/50 mL premix (0 g Intravenous Stopped 10/29/17 1412)  Tdap (BOOSTRIX) injection 0.5 mL (0.5 mLs Intramuscular Given 10/29/17 1406)  morphine 4 MG/ML injection 4 mg (4 mg Intravenous Given 10/29/17 1405)  ondansetron (ZOFRAN) injection 4 mg (4 mg Intravenous Given 10/29/17 1405)    Pertinent labs & imaging results that were available during my care of the patient were reviewed by me and considered in my medical decision making (see chart for details).  _________________________________________   FINAL CLINICAL IMPRESSION(S) / ED DIAGNOSES  Final diagnoses:  Type I or II open fracture of distal end of right radius with ulna, initial encounter  Atrial flutter, unspecified type [facility])    ED Discharge Orders    None       If controlled  substance prescribed during this visit, 12 month history viewed on the [facility] prior to issuing an initial prescription for Schedule II or III opiod.    James Pester, FNP 10/29/17 1641    Nita Sickle, MD 10/30/17 [phone]

## 2017-10-29 NOTE — ED Notes (Signed)
See triage note  States he fell when taking the dogs out last pm  States h thinks he put his right arm out to brace self  Swelling noted to right hand and wrist area abrasion/superficial laceration noted to posterior wrist

## 2017-10-29 NOTE — ED Notes (Signed)
Family states that he is becoming forgetful and they notice some slurred speech mainly in the evenings  These sxs' have been going on for a while

## 2017-10-29 NOTE — Anesthesia Preprocedure Evaluation (Addendum)
Anesthesia Evaluation  Patient identified by MRN, date of birth, ID band Patient awake    Reviewed: Allergy & Precautions, H&P , NPO status , Patient's Chart, lab work & pertinent test results, reviewed documented beta blocker date and time   History of Anesthesia Complications Negative for: history of anesthetic complications  Airway Mallampati: II  TM Distance: >3 FB Neck ROM: full    Dental  (+) Edentulous Upper, Edentulous Lower   Pulmonary neg shortness of breath, sleep apnea (history of, but none since weight loss) , neg COPD, neg recent URI,           Cardiovascular Exercise Tolerance: Good hypertension, (-) angina(-) CAD, (-) Past MI, (-) Cardiac Stents and (-) CABG + dysrhythmias (new onset, cleared by cards) Atrial Fibrillation (-) Valvular Problems/Murmurs     Neuro/Psych negative neurological ROS  negative psych ROS   GI/Hepatic negative GI ROS, Neg liver ROS,   Endo/Other  negative endocrine ROS  Renal/GU negative Renal ROS  negative genitourinary   Musculoskeletal  (+) Arthritis ,   Abdominal   Peds  Hematology negative hematology ROS (+)   Anesthesia Other Findings Past Medical History: No date: Arthritis No date: Diabetes mellitus without complication (HCC) No date: Hyperlipemia No date: Hypertension No date: Sleep apnea   Reproductive/Obstetrics negative OB ROS                            Anesthesia Physical Anesthesia Plan  ASA: II  Anesthesia Plan: General   Post-op Pain Management:  Regional for Post-op pain   Induction: Intravenous  PONV Risk Score and Plan: 2 and Ondansetron and Dexamethasone  Airway Management Planned: LMA  Additional Equipment:   Intra-op Plan:   Post-operative Plan: Extubation in OR  Informed Consent: I have reviewed the patients History and Physical, chart, labs and discussed the procedure including the risks, benefits and  alternatives for the proposed anesthesia with the patient or authorized representative who has indicated his/her understanding and acceptance.   Dental Advisory Given  Plan Discussed with: Anesthesiologist, CRNA and Surgeon  Anesthesia Plan Comments:         Anesthesia Quick Evaluation

## 2017-10-29 NOTE — H&P (Signed)
[facility] [place] [facility]   PATIENT NAME: James Sanford    MR#:  [id number]  DATE OF BIRTH:  11/20/1955  DATE OF ADMISSION:  10/29/2017  PRIMARY CARE PHYSICIAN: Jerl Mina, MD   REQUESTING/REFERRING PHYSICIAN:  Dr. Dorothea Glassman  CHIEF COMPLAINT:   Chief Complaint  Patient presents with  . Fall    HISTORY OF PRESENT ILLNESS:  James Sanford  is a 62 y.o. male with no significant past medical history and not taking any medications at home currently presents to hospital secondary to fall and right hand pain. Patient is a poor historian. He says he got up in the middle of the night to take his dog out, he slipped and fell on the concrete steps and felt like he landed on his outstretched hand. Came with swelling and pain off the right forearm and hand. X-ray showing community distal radial and ulnar fractures. Patient denies any other falls. Denies significant alcohol use though he drinks her beer occasionally. No history of strokes or mini strokes. Denies any lightheadedness or dizziness or chest pain or palpitations. Labs are unremarkable. EKG showing new onset atrial fibrillation that he was not aware of. But not symptomatic at all. CT of the head showing mild atrophy and small vessel ischemic changes no acute abnormality. There is some right sided soft tissue swelling of the scalp from the fall. Ortho has been consulted.  PAST MEDICAL HISTORY:   Past Medical History:  Diagnosis Date  . Arthritis   . Diabetes mellitus without complication (HCC)   . Hyperlipemia   . Hypertension   . Sleep apnea     PAST SURGICAL HISTORY:   Past Surgical History:  Procedure Laterality Date  . GASTRIC BYPASS    . HERNIA REPAIR    . JOINT REPLACEMENT Bilateral    Knee surgery    SOCIAL HISTORY:   Social History   Tobacco Use  . Smoking status: Never Smoker  . Smokeless tobacco: Former Neurosurgeon    Types: Chew  . Tobacco comment: Luellen Pucker about 4 years ago    Substance Use Topics  . Alcohol use: Yes    Comment: beer occasionally    FAMILY HISTORY:   Family History  Problem Relation Age of Onset  . Stroke Mother   . Suicidality Father     DRUG ALLERGIES:  No Known Allergies  REVIEW OF SYSTEMS:   Review of Systems  Constitutional: Negative for chills, fever, malaise/fatigue and weight loss.  HENT: Negative for ear discharge, ear pain, hearing loss, nosebleeds and tinnitus.   Eyes: Negative for blurred vision, double vision and photophobia.  Respiratory: Negative for cough, hemoptysis, shortness of breath and wheezing.   Cardiovascular: Negative for chest pain, palpitations, orthopnea and leg swelling.  Gastrointestinal: Negative for abdominal pain, constipation, diarrhea, heartburn, melena, nausea and vomiting.  Genitourinary: Negative for dysuria, frequency and urgency.  Musculoskeletal: Positive for joint pain. Negative for back pain, myalgias and neck pain.  Skin: Negative for rash.  Neurological: Negative for dizziness, tingling, tremors, sensory change, speech change, focal weakness and headaches.  Endo/Heme/Allergies: Does not bruise/bleed easily.  Psychiatric/Behavioral: Negative for depression.    MEDICATIONS AT HOME:   Prior to Admission medications   Not on File      VITAL SIGNS:  Blood pressure (!) 143/96, pulse (!) 59, temperature 98.7 F (37.1 C), temperature source Oral, resp. rate 14, height 6' (1.829 m), weight 81.6 kg (180 lb), SpO2 98 %.  PHYSICAL EXAMINATION:  Physical Exam  GENERAL:  62 y.o.-year-old patient lying in the bed with no acute distress.  EYES: Pupils equal, round, reactive to light and accommodation. No scleral icterus. Extraocular muscles intact.  HEENT: Head atraumatic, normocephalic. Oropharynx and nasopharynx clear.  NECK:  Supple, no jugular venous distention. No thyroid enlargement, no tenderness.  LUNGS: Normal breath sounds bilaterally, no wheezing, rales,rhonchi or crepitation.  No use of accessory muscles of respiration.  CARDIOVASCULAR: S1, S2 normal. No murmurs, rubs, or gallops.  ABDOMEN: Soft, nontender, nondistended. Bowel sounds present. No organomegaly or mass.  EXTREMITIES: right forearm and hand swollen No pedal edema, cyanosis, or clubbing.  NEUROLOGIC: Cranial nerves II through XII are intact. Muscle strength 5/5 in all extremities. Sensation intact. Gait not checked.  PSYCHIATRIC: The patient is alert and oriented x 3.  SKIN: No obvious rash, lesion, or ulcer.   LABORATORY PANEL:   CBC Recent Labs  Lab 10/29/17 1305  WBC 8.4  HGB 14.1  HCT 41.2  PLT 168   ------------------------------------------------------------------------------------------------------------------  Chemistries  Recent Labs  Lab 10/29/17 1305  NA 143  K 4.3  CL 107  CO2 29  GLUCOSE 169*  BUN 12  CREATININE 0.63  CALCIUM 8.3*  AST 38  ALT 27  ALKPHOS 94  BILITOT 1.1   ------------------------------------------------------------------------------------------------------------------  Cardiac Enzymes No results for input(s): TROPONINI in the last 168 hours. ------------------------------------------------------------------------------------------------------------------  RADIOLOGY:  Dg Elbow Complete Right  Result Date: 10/29/2017 CLINICAL DATA:  Recent fall while walking dog EXAM: RIGHT ELBOW - COMPLETE 3+ VIEW COMPARISON:  None. FINDINGS: Degenerative changes about the elbow joint are noted. Olecranon spurring is seen. No acute fracture or dislocation is noted. IMPRESSION: Degenerative change without acute abnormality. Electronically Signed   By: Alcide Clever M.D.   On: 10/29/2017 13:14   Dg Wrist Complete Right  Result Date: 10/29/2017 CLINICAL DATA:  Recent fall with wrist pain, initial encounter EXAM: RIGHT WRIST - COMPLETE 3+ VIEW COMPARISON:  None. FINDINGS: Comminuted distal radial and ulnar fractures are noted. Some impaction and displacement of the  fracture fragments at the fracture site is noted. There appears to be some articular extension in the distal radius. Considerable soft tissue deformity is noted. IMPRESSION: Comminuted distal radial and ulnar fractures with apparent intra-articular involvement in the distal radius. Multiple displaced fractures are noted anteriorly. Electronically Signed   By: Alcide Clever M.D.   On: 10/29/2017 12:44   Ct Head Wo Contrast  Result Date: 10/29/2017 CLINICAL DATA:  62 year old male with acute head injury following fall last night. Initial encounter. EXAM: CT HEAD WITHOUT CONTRAST TECHNIQUE: Contiguous axial images were obtained from the base of the skull through the vertex without intravenous contrast. COMPARISON:  None. FINDINGS: Brain: No evidence of acute infarction, hemorrhage, hydrocephalus, extra-axial collection or mass lesion/mass effect. Mild atrophy and mild probable chronic small-vessel white matter ischemic changes noted. Vascular: Intracranial carotid atherosclerotic calcifications noted. Skull: Normal. Negative for fracture or focal lesion. Sinuses/Orbits: No acute finding. Other: Mild RIGHT anterior scalp soft tissue swelling noted. IMPRESSION: 1. No evidence of acute intracranial abnormality. 2. RIGHT anterior scalp soft tissue swelling without underlying fracture. 3. Mild atrophy and mild probable chronic small-vessel white matter ischemic changes. Electronically Signed   By: Harmon Pier M.D.   On: 10/29/2017 12:59    EKG:   Orders placed or performed during the hospital encounter of 10/29/17  . ED EKG  . ED EKG    IMPRESSION AND PLAN:   James Sanford  is a 62 y.o. male  with no significant past medical history and not taking any medications at home currently presents to hospital secondary to fall and right hand pain.  1. Right distal radial and ulnar fracture-orthopedics has been consulted. -Patient is low risk for surgery. Can proceed with surgery -pain control and physical  therapy recommended  2. New onset atrial fibrillation-not symptomatic. Will get an echocardiogram to rule out LV thrombus. No history of strokes or many strokes. No need for bridging with anticoagulation prior to surgery. -Outpatient follow-up with cardiology for anticoagulation recommendations -heart rate is in the 50s, if it improves-could use a beta blocker.  3. DVT Prophylaxis- none prior to surgery    All the records are reviewed and case discussed with ED provider. Management plans discussed with the patient, family and they are in agreement.  CODE STATUS: Full Code  TOTAL TIME TAKING CARE OF THIS PATIENT: 50 minutes.    Enid Baas M.D on 10/29/2017 at 3:17 PM  Between 7am to 6pm - Pager - [phone]  After 6pm go to [email or web address] - Social research officer, government  [facility]  Office  [phone]  CC: Primary care physician; Jerl Mina, MD

## 2017-10-29 NOTE — ED Provider Notes (Signed)
EKG read and interpreted by me shows atrial flutter at rate of 81 left axis flipped T in V3 but no other acute changes that I can see.  The baseline is very regular.   Arnaldo Natal, MD 10/29/17 1447

## 2017-10-29 NOTE — ED Triage Notes (Signed)
Pt to ED via POV c/o right wrist pain.Pt states that he woke up in the middle of the night to take the dog out and fell. Pt has swelling noted to right wrist. Pt is able to move fingers but is unable to move wrist. Pt has small abrasion to the right wrist area. Palpable pulses present, hand is cool to touch. Pt denies pain in any other area.

## 2017-10-29 NOTE — ED Notes (Signed)
FIRST NURSE NOTE:  Pt from [facility], was seen for right wrist injury. Wife reports pt remembers taking dog outside last night, but found this morning a pool of blood on the steps and pt has possible deformity to elbow. BP at [facility]  158/106

## 2017-10-30 ENCOUNTER — Inpatient Hospital Stay: Payer: BC Managed Care – PPO | Admitting: Anesthesiology

## 2017-10-30 ENCOUNTER — Inpatient Hospital Stay
Admit: 2017-10-30 | Discharge: 2017-10-30 | Disposition: A | Discharge status: Home / Self Care | Payer: BC Managed Care – PPO | Attending: Internal Medicine | Admitting: Internal Medicine

## 2017-10-30 ENCOUNTER — Encounter
Admission: EM | Disposition: A | Discharge status: Home / Self Care | Payer: Self-pay | Source: Home / Self Care | Attending: Internal Medicine

## 2017-10-30 HISTORY — PX: OPEN REDUCTION INTERNAL FIXATION (ORIF) DISTAL RADIAL FRACTURE: SHX5989

## 2017-10-30 LAB — BASIC METABOLIC PANEL
Anion gap: 5 (ref 5–15)
BUN: 11 mg/dL (ref 6–20)
CALCIUM: 8 mg/dL — AB (ref 8.9–10.3)
CHLORIDE: 107 mmol/L (ref 98–111)
CO2: 28 mmol/L (ref 22–32)
Creatinine, Ser: 0.7 mg/dL (ref 0.61–1.24)
GFR calc non Af Amer: >60 mL/min (ref >60)
GLUCOSE: 115 mg/dL — AB (ref 70–99)
Potassium: 3.9 mmol/L (ref 3.5–5.1)
Sodium: 140 mmol/L (ref 135–145)

## 2017-10-30 LAB — CBC
HCT: 39.4 % — ABNORMAL LOW (ref 40.0–52.0)
Hemoglobin: 13.4 g/dL (ref 13.0–18.0)
MCH: 33.5 pg (ref 26.0–34.0)
MCHC: 34.1 g/dL (ref 32.0–36.0)
MCV: 98.4 fL (ref 80.0–100.0)
Platelets: 141 10*3/uL — ABNORMAL LOW (ref 150–440)
RBC: 4 MIL/uL — AB (ref 4.40–5.90)
RDW: 13.8 % (ref 11.5–14.5)
WBC: 7.6 10*3/uL (ref 3.8–10.6)

## 2017-10-30 LAB — ECHOCARDIOGRAM COMPLETE
HEIGHTINCHES: 72 in
Weight: 2880 oz

## 2017-10-30 LAB — VITAMIN B12: Vitamin B-12: 362 pg/mL (ref 180–914)

## 2017-10-30 SURGERY — OPEN REDUCTION INTERNAL FIXATION (ORIF) DISTAL RADIUS FRACTURE
Anesthesia: General | Laterality: Right | Wound class: Clean

## 2017-10-30 MED ORDER — PROPOFOL 10 MG/ML IV BOLUS
INTRAVENOUS | Status: DC | PRN
  Administered 2017-10-30: 140 mg via INTRAVENOUS

## 2017-10-30 MED ORDER — DEXAMETHASONE SODIUM PHOSPHATE 10 MG/ML IJ SOLN
INTRAMUSCULAR | Status: AC
  Filled 2017-10-30: qty 1

## 2017-10-30 MED ORDER — FENTANYL CITRATE (PF) 100 MCG/2ML IJ SOLN
INTRAMUSCULAR | Status: AC
  Filled 2017-10-30: qty 2

## 2017-10-30 MED ORDER — PHENOL 1.4 % MT LIQD
1.0000 | OROMUCOSAL | Status: DC | PRN
  Filled 2017-10-30: qty 177

## 2017-10-30 MED ORDER — ASPIRIN EC 325 MG PO TBEC
325.0000 mg | DELAYED_RELEASE_TABLET | Freq: Every day | ORAL | Status: DC
  Administered 2017-10-31: 325 mg via ORAL
  Filled 2017-10-30: qty 1

## 2017-10-30 MED ORDER — CELECOXIB 200 MG PO CAPS
200.0000 mg | ORAL_CAPSULE | Freq: Two times a day (BID) | ORAL | Status: DC
  Administered 2017-10-30 – 2017-10-31 (×2): 200 mg via ORAL
  Filled 2017-10-30 (×2): qty 1

## 2017-10-30 MED ORDER — AMLODIPINE BESYLATE 5 MG PO TABS
5.0000 mg | ORAL_TABLET | Freq: Every day | ORAL | Status: DC
  Administered 2017-10-30 – 2017-10-31 (×2): 5 mg via ORAL
  Filled 2017-10-30 (×2): qty 1

## 2017-10-30 MED ORDER — LACTATED RINGERS IV SOLN: INTRAVENOUS | Status: DC | PRN

## 2017-10-30 MED ORDER — SODIUM CHLORIDE 0.9 % IV SOLN
INTRAVENOUS | Status: DC
  Administered 2017-10-30: 100 mL/h via INTRAVENOUS

## 2017-10-30 MED ORDER — GABAPENTIN 300 MG PO CAPS
300.0000 mg | ORAL_CAPSULE | Freq: Every day | ORAL | Status: DC
  Administered 2017-10-30: 300 mg via ORAL
  Filled 2017-10-30: qty 1

## 2017-10-30 MED ORDER — FERROUS SULFATE 325 (65 FE) MG PO TABS
325.0000 mg | ORAL_TABLET | Freq: Two times a day (BID) | ORAL | Status: DC
  Administered 2017-10-31: 325 mg via ORAL
  Filled 2017-10-30: qty 1

## 2017-10-30 MED ORDER — PROMETHAZINE HCL 25 MG/ML IJ SOLN: 6.2500 mg | INTRAMUSCULAR | Status: DC | PRN

## 2017-10-30 MED ORDER — MORPHINE SULFATE (PF) 2 MG/ML IV SOLN
2.0000 mg | INTRAVENOUS | Status: DC | PRN
  Administered 2017-10-30: 2 mg via INTRAVENOUS

## 2017-10-30 MED ORDER — CEFAZOLIN SODIUM-DEXTROSE 2-4 GM/100ML-% IV SOLN
2.0000 g | Freq: Four times a day (QID) | INTRAVENOUS | Status: DC
  Administered 2017-10-31 (×3): 2 g via INTRAVENOUS
  Filled 2017-10-30 (×5): qty 100

## 2017-10-30 MED ORDER — FENTANYL CITRATE (PF) 100 MCG/2ML IJ SOLN
25.0000 ug | INTRAMUSCULAR | Status: DC | PRN
  Administered 2017-10-30 (×4): 25 ug via INTRAVENOUS

## 2017-10-30 MED ORDER — PROPOFOL 10 MG/ML IV BOLUS
INTRAVENOUS | Status: AC
  Filled 2017-10-30: qty 20

## 2017-10-30 MED ORDER — FENTANYL CITRATE (PF) 100 MCG/2ML IJ SOLN
50.0000 ug | Freq: Once | INTRAMUSCULAR | Status: AC
  Administered 2017-10-30: 50 ug via INTRAVENOUS

## 2017-10-30 MED ORDER — METOCLOPRAMIDE HCL 5 MG/ML IJ SOLN: 5.0000 mg | Freq: Three times a day (TID) | INTRAMUSCULAR | Status: DC | PRN

## 2017-10-30 MED ORDER — MENTHOL 3 MG MT LOZG
1.0000 | LOZENGE | OROMUCOSAL | Status: DC | PRN
  Filled 2017-10-30: qty 9

## 2017-10-30 MED ORDER — METOCLOPRAMIDE HCL 10 MG PO TABS: 5.0000 mg | ORAL_TABLET | Freq: Three times a day (TID) | ORAL | Status: DC | PRN

## 2017-10-30 MED ORDER — LIDOCAINE HCL (PF) 1 % IJ SOLN
INTRAMUSCULAR | Status: DC | PRN
  Administered 2017-10-30: 5 mL via SUBCUTANEOUS

## 2017-10-30 MED ORDER — BISACODYL 10 MG RE SUPP: 10.0000 mg | Freq: Every day | RECTAL | Status: DC | PRN

## 2017-10-30 MED ORDER — ROPIVACAINE HCL 5 MG/ML IJ SOLN
INTRAMUSCULAR | Status: DC | PRN
  Administered 2017-10-30: 30 mL via PERINEURAL

## 2017-10-30 MED ORDER — PROPOFOL 10 MG/ML IV BOLUS
INTRAVENOUS | Status: AC
  Filled 2017-10-30: qty 40

## 2017-10-30 MED ORDER — ONDANSETRON HCL 4 MG/2ML IJ SOLN
INTRAMUSCULAR | Status: AC
  Filled 2017-10-30: qty 2

## 2017-10-30 MED ORDER — PHENYLEPHRINE HCL 10 MG/ML IJ SOLN
INTRAMUSCULAR | Status: AC
  Filled 2017-10-30: qty 1

## 2017-10-30 MED ORDER — FENTANYL CITRATE (PF) 100 MCG/2ML IJ SOLN
INTRAMUSCULAR | Status: DC | PRN
  Administered 2017-10-30 (×2): 50 ug via INTRAVENOUS

## 2017-10-30 MED ORDER — LIDOCAINE HCL (PF) 2 % IJ SOLN
INTRAMUSCULAR | Status: AC
  Filled 2017-10-30: qty 10

## 2017-10-30 MED ORDER — NITROGLYCERIN 2 % TD OINT
0.5000 [in_us] | TOPICAL_OINTMENT | Freq: Four times a day (QID) | TRANSDERMAL | Status: DC
  Administered 2017-10-30 – 2017-10-31 (×3): 0.5 [in_us] via TOPICAL
  Filled 2017-10-30 (×2): qty 1

## 2017-10-30 MED ORDER — FENTANYL CITRATE (PF) 100 MCG/2ML IJ SOLN
INTRAMUSCULAR | Status: AC
Start: 2017-10-30 — End: 2017-10-30
  Administered 2017-10-30: 25 ug via INTRAVENOUS
  Filled 2017-10-30: qty 2

## 2017-10-30 MED ORDER — MIDAZOLAM HCL 2 MG/2ML IJ SOLN
1.0000 mg | Freq: Once | INTRAMUSCULAR | Status: AC
  Administered 2017-10-30: 1 mg via INTRAVENOUS

## 2017-10-30 MED ORDER — OXYCODONE HCL 5 MG PO TABS
10.0000 mg | ORAL_TABLET | ORAL | Status: DC | PRN
  Administered 2017-10-31 (×2): 10 mg via ORAL
  Filled 2017-10-30 (×2): qty 2

## 2017-10-30 MED ORDER — CEFAZOLIN SODIUM-DEXTROSE 2-3 GM-%(50ML) IV SOLR
INTRAVENOUS | Status: DC | PRN
  Administered 2017-10-30: 2 g via INTRAVENOUS

## 2017-10-30 MED ORDER — MAGNESIUM HYDROXIDE 400 MG/5ML PO SUSP: 30.0000 mL | Freq: Every day | ORAL | Status: DC | PRN

## 2017-10-30 MED ORDER — HYDRALAZINE HCL 20 MG/ML IJ SOLN
10.0000 mg | INTRAMUSCULAR | Status: DC | PRN
  Administered 2017-10-30: 10 mg via INTRAVENOUS
  Filled 2017-10-30: qty 1

## 2017-10-30 MED ORDER — FLEET ENEMA 7-19 GM/118ML RE ENEM: 1.0000 | ENEMA | Freq: Once | RECTAL | Status: DC | PRN

## 2017-10-30 SURGICAL SUPPLY — 55 items
BANDAGE ELASTIC 3 LF NS (GAUZE/BANDAGES/DRESSINGS) ×6 IMPLANT
BIT DRILL 1.9X15 (BIT) ×1
BIT DRILL 1.9X15MM (BIT) ×1 IMPLANT
BIT DRILL 2.0MM 105MM (BIT) ×3 IMPLANT
BNDG ESMARK 4X12 TAN STRL LF (GAUZE/BANDAGES/DRESSINGS) ×3 IMPLANT
BRUSH SCRUB EZ  4% CHG (MISCELLANEOUS)
BRUSH SCRUB EZ 4% CHG (MISCELLANEOUS) IMPLANT
CANISTER SUCT 1200ML W/VALVE (MISCELLANEOUS) ×3 IMPLANT
CAST PADDING 3X4FT ST 30246 (SOFTGOODS) ×6
CHLORAPREP W/TINT 26ML (MISCELLANEOUS) ×3 IMPLANT
CORD BIP STRL DISP 12FT (MISCELLANEOUS) IMPLANT
CUFF TOURN 18 STER (MISCELLANEOUS) ×3 IMPLANT
CUFF TOURN 24 STER (MISCELLANEOUS) IMPLANT
DRAPE FLUOR MINI C-ARM 54X84 (DRAPES) ×3 IMPLANT
DRAPE SHEET LG 3/4 BI-LAMINATE (DRAPES) ×3 IMPLANT
DRAPE SURG 17X11 SM STRL (DRAPES) ×3 IMPLANT
DRILL BIT 1.9X15MM (BIT) ×2
ELECT REM PT RETURN 9FT ADLT (ELECTROSURGICAL) ×3
ELECTRODE REM PT RTRN 9FT ADLT (ELECTROSURGICAL) ×1 IMPLANT
FORCEPS JEWEL BIP 4-3/4 STR (INSTRUMENTS) IMPLANT
GAUZE PETRO XEROFOAM 1X8 (MISCELLANEOUS) IMPLANT
GAUZE SPONGE 4X4 12PLY STRL (GAUZE/BANDAGES/DRESSINGS) ×3 IMPLANT
GLOVE INDICATOR 8.0 STRL GRN (GLOVE) ×3 IMPLANT
GLOVE SURG ORTHO 8.0 STRL STRW (GLOVE) ×12 IMPLANT
GOWN STRL REUS W/ TWL LRG LVL3 (GOWN DISPOSABLE) ×1 IMPLANT
GOWN STRL REUS W/ TWL XL LVL3 (GOWN DISPOSABLE) ×1 IMPLANT
GOWN STRL REUS W/TWL LRG LVL3 (GOWN DISPOSABLE) ×2
GOWN STRL REUS W/TWL XL LVL3 (GOWN DISPOSABLE) ×2
KIT TURNOVER KIT A (KITS) ×3 IMPLANT
NS IRRIG 1000ML POUR BTL (IV SOLUTION) ×3 IMPLANT
PACK EXTREMITY ARMC (MISCELLANEOUS) ×3 IMPLANT
PAD ABD DERMACEA PRESS 5X9 (GAUZE/BANDAGES/DRESSINGS) ×6 IMPLANT
PAD CAST CTTN 3X4 STRL (SOFTGOODS) ×3 IMPLANT
PADDING CAST 3IN STRL (MISCELLANEOUS) ×2
PADDING CAST BLEND 3X4 STRL (MISCELLANEOUS) ×1 IMPLANT
PLATE VOLAR RADIUS ANA R L (Plate) ×3 IMPLANT
SCREW BONE 2.7X14 (Screw) ×3 IMPLANT
SCREW BONE 2.7X16MM (Screw) ×3 IMPLANT
SCREW BONE 2.7X22 (Screw) ×3 IMPLANT
SCREW BONE 2.7X24 (Screw) ×3 IMPLANT
SCREW BONE 2.7X26MM (Screw) ×6 IMPLANT
SCREW LOCKING 2.7X16 (Screw) ×9 IMPLANT
SCREW LOCKING 2.7X22 (Screw) ×6 IMPLANT
SCREW LOCKING 2.7X24MM (Screw) ×3 IMPLANT
SCREW LOCKING 2.7X26MM (Screw) ×9 IMPLANT
SPLINT CAST 1 STEP 3X12 (MISCELLANEOUS) IMPLANT
SPLINT CAST 1 STEP 4X15 (MISCELLANEOUS) ×6 IMPLANT
SPLINT CAST 1 STEP 4X30 (MISCELLANEOUS) ×3 IMPLANT
STAPLER SKIN PROX 35W (STAPLE) ×3 IMPLANT
STOCKINETTE STRL 4IN 9604848 (GAUZE/BANDAGES/DRESSINGS) ×3 IMPLANT
SUT ETHILON 2 0 FS 18 (SUTURE) ×6 IMPLANT
SUT ETHILON 3 0 PS 1 (SUTURE) ×3 IMPLANT
SUT VIC AB 3-0 SH 27 (SUTURE) ×4
SUT VIC AB 3-0 SH 27X BRD (SUTURE) ×2 IMPLANT
TOWEL OR 17X26 4PK STRL BLUE (TOWEL DISPOSABLE) ×3 IMPLANT

## 2017-10-30 NOTE — Anesthesia Procedure Notes (Signed)
Anesthesia Regional Block: Supraclavicular block   Pre-Anesthetic Checklist: ,, timeout performed, Correct Patient, Correct Site, Correct Laterality, Correct Procedure, Correct Position, site marked, Risks and benefits discussed,  Surgical consent,  Pre-op evaluation,  At surgeon's request and post-op pain management  Laterality: Right and Upper  Prep: chloraprep       Needles:  Injection technique: Single-shot  Needle Type: Stimiplex     Needle Length: 10cm  Needle Gauge: 22     Additional Needles:   Procedures:,,,, ultrasound used (permanent image in chart),,,,  Narrative:  Start time: 10/30/2017 5:24 PM End time: 10/30/2017 5:30 PM Injection made incrementally with aspirations every 5 mL.  Performed by: Personally  Anesthesiologist: Lenard Simmer, MD  Additional Notes: Functioning IV was confirmed and monitors were applied.  A 11mm 22ga Stimuplex needle was used. Sterile prep and drape,hand hygiene and sterile gloves were used.  Negative aspiration and negative test dose prior to incremental administration of local anesthetic. The patient tolerated the procedure well.

## 2017-10-30 NOTE — Progress Notes (Signed)
Pt's systolic BP is at 160's and diastolic at 100's and c/o of moderate to severe pain. Pt was given Morphine 2mg  IV as ordered. BP rechecked=161/116. Dr. Sheryle Hail paged and ordered Nitroglycerin ointment on the chest and Amlodipine 5mg  PO. Will administer as ordered and continue to monitor.

## 2017-10-30 NOTE — Op Note (Signed)
BRIEF OPERATIVE NOTE  DATE OF SURGERY:  10/29/2017 - 10/30/2017  PATIENT NAME:  James Sanford   DOB: 1956/03/02  MRN: [id number]   PRE-OPERATIVE DIAGNOSIS:  Right Distal Radius Fracture grade 1 open  POST-OPERATIVE DIAGNOSIS:  Same  PROCEDURE:  Procedure(s): 1. OPEN REDUCTION INTERNAL FIXATION (ORIF) DISTAL RADIAL FRACTURE 2. Irrigation and debridement right distal radius grade 1 open fracture  SURGEON:  Garnette Gunner, M.D.   ASSISTANT:  ANESTHESIA: general endotracheal anesthesia  ESTIMATED BLOOD LOSS: 50 mL  FLUIDS REPLACED: 500 mL of crystalloid  TOURNIQUET TIME: 56 minutes  DRAINS: None  IMPLANTS UTILIZED: Stryker 6-hole distal radius locking plate  Brief history: Patient is a 62 years old male who presented in the ER on 10/29/2017 after an apparent fall the night before. Patient did not exactly remember the mechanism of injury. X-rays in the ER showed evidence of a significantly comminuted right distal radius intra-articular fracture. Patient also had a 1 cm poke hole wound on the volar side of his wrist. Patient was admitted to the hospitalist service. Based on the nature of the injury irrigation and debridement and internal fixation of the fracture was planned. Patient understood that the risks involved in surgery include but are not limited to risk of infection, need for further surgery, continued pain, nonunion, stiffness and implant related complications. Patient was seen on the day of surgery in the preop holding area. Surgical site was marked. Patient was then wheeled back into the operating room. Description of the procedure: Patient was placed supine on the operating table. Proper timeout was performed. 2 g of IV Ancef was given preoperatively. Right upper extremity tourniquet above the elbow was applied. Right upper extremity was then prepped and draped. We started with irrigation and debridement of the poke hole wound. Irrigation was done with normal saline.  There was no significant foreign material are necrotic tissue in that wound. It appeared to be an inside out wound.after irrigation and debridement of the wound we proceeded with the open reduction internal fixation of the distal radius fracture. A volar Sherilyn Cooter approach was made. This incision was made in line with the flexor carpi radialis tendon. Skin and the fascia was incised. The tendon was retracted towards the radial side. The flexor pollicis longus was retracted and pronator quadratus was split from the distal radial side and subperiosteal elevation was performed. Examination of the distal radius showed significant comminution with a large metaphyseal fragment extending from the ulnar side of the radius proximally. We proceeded with a lag screw fixation of the large butterfly fragment on the ulnar side. A 22 mm long lag screw was placed from the radial side to the ulnar side of the radius using lag technique. We then directed our attention towards the distal radius fragment. There was also significant comminution involving the distal radial fragment. We then procured a 6-hole distal radial periarticular locking plate. This was initially fixed to the metaphyseal fragment using a nonlocking screw. We then fixed the plate to the distal radial fragment using a nonlocking screw. [place] allowed traction and ulnar deviation at the wrist was used to bring the radius to length. Position of the reduction was checked under AP and lateral C-arm images. This was followed by application of locking screws through the plate both proximal and distal to the fracture site.at this stage in the ulna was evaluated. Ulna had maintained a good alignment. No fixation was done on the ulnar side. The tourniquet was released. Hemostasis was achieved. Wound was  loosely closed using 2-0 nylon. A sugar tong splint was applied.  Disposition:  Patient will be admitted back to the hospitalist service. After removal of stitches patient  would be switched to a short arm cast for at least 4-6 weeks.  Garnette Gunner, M.D.

## 2017-10-30 NOTE — Anesthesia Procedure Notes (Signed)
Procedure Name: LMA Insertion Date/Time: 10/30/2017 5:42 PM Performed by: Clyde Lundborg, CRNA Pre-anesthesia Checklist: Patient identified, Emergency Drugs available, Suction available and Patient being monitored Patient Re-evaluated:Patient Re-evaluated prior to induction Oxygen Delivery Method: Circle system utilized Preoxygenation: Pre-oxygenation with 100% oxygen Induction Type: IV induction Ventilation: Mask ventilation without difficulty LMA: LMA inserted LMA Size: 4.0 Number of attempts: 1 Placement Confirmation: positive ETCO2,  CO2 detector and breath sounds checked- equal and bilateral Tube secured with: Tape Dental Injury: Teeth and Oropharynx as per pre-operative assessment

## 2017-10-30 NOTE — Consult Note (Addendum)
Cardiology Consultation Note    Patient ID: James Sanford, MRN: [id number], DOB/AGE: [birth year] 62 y.o. Admit date: 10/29/2017   Date of Consult: 10/30/2017 Primary Physician: Jerl Mina, MD Primary Cardiologist:    Chief Complaint: right arm pain Reason for Consultation: afib Requesting MD: Dr. Tobi Bastos  HPI: James Sanford is a 62 y.o. male with history of no medical problems, status post gastric bypass surgery 4 years ago losing 230 pounds who was admitted after suffering a mechanical fall last night while walking his dog.  He slipped and fell on the concrete steps.  He caused pain in his right wrist.  He presented to the emergency room where x-ray revealed comminuted distal radial and ulnar fractures with apparent intra-articular involvement.  Patient was noted to have atrial fibrillation which had not been previously noted.  Patient is asymptomatic from this.  Electrocardiogram reveals atrial fibrillation with right bundle branch block.  Patient denies any chest pain or shortness of breath.  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  He has no chest pain or shortness of breath with this.  He drinks alcohol but per his report, not excessively.  He was somewhat hypertensive on presentation.  He was given amlodipine and placed on Nitropaste to reduce his blood pressure.  CT scan of the head revealed no significant abnormalities.  Laboratories are normal.  Ethanol level was normal.  He is asymptomatic cardiac standpoint.  Past Medical History:  Diagnosis Date  . Arthritis   . Diabetes mellitus without complication (HCC)   . Hyperlipemia   . Hypertension   . Sleep apnea       Surgical History:  Past Surgical History:  Procedure Laterality Date  . GASTRIC BYPASS    . HERNIA REPAIR    . JOINT REPLACEMENT Bilateral    Knee surgery     Home Meds: Prior to Admission medications   Not on File    Inpatient Medications:  . amLODipine  5 mg Oral  Daily  . nitroGLYCERIN  0.5 inch Topical Q6H   . sodium chloride 60 mL/hr at 10/29/17 1847    Allergies: No Known Allergies  Social History   Socioeconomic History  . Marital status: Married    Spouse name: Not on file  . Number of children: Not on file  . Years of education: Not on file  . Highest education level: Not on file  Occupational History  . Not on file  Social Needs  . Financial resource strain: Not on file  . Food insecurity:    Worry: Not on file    Inability: Not on file  . Transportation needs:    Medical: Not on file    Non-medical: Not on file  Tobacco Use  . Smoking status: Never Smoker  . Smokeless tobacco: Former Neurosurgeon    Types: Chew  . Tobacco comment: Luellen Pucker about 4 years ago  Substance and Sexual Activity  . Alcohol use: Yes    Comment: beer occasionally  . Drug use: Not Currently  . Sexual activity: Not on file  Lifestyle  . Physical activity:    Days per week: Not on file    Minutes per session: Not on file  . Stress: Not on file  Relationships  . Social connections:    Talks on phone: Not on file    Gets together: Not on file    Attends religious service: Not on file    Active member of club  or organization: Not on file    Attends meetings of clubs or organizations: Not on file    Relationship status: Not on file  . Intimate partner violence:    Fear of current or ex partner: Not on file    Emotionally abused: Not on file    Physically abused: Not on file    Forced sexual activity: Not on file  Other Topics Concern  . Not on file  Social History Narrative   Lives at home with his wife. Independent baseline     Family History  Problem Relation Age of Onset  . Stroke Mother   . Suicidality Father      Review of Systems: A 12-system review of systems was performed and is negative except as noted in the HPI.  Labs: No results for input(s): CKTOTAL, CKMB, TROPONINI in the last 72 hours. Lab Results  Component Value Date   WBC  7.6 10/30/2017   HGB 13.4 10/30/2017   HCT 39.4 (L) 10/30/2017   MCV 98.4 10/30/2017   PLT 141 (L) 10/30/2017    Recent Labs  Lab 10/29/17 1305 10/30/17 0511  NA 143 140  K 4.3 3.9  CL 107 107  CO2 29 28  BUN 12 11  CREATININE 0.63 0.70  CALCIUM 8.3* 8.0*  PROT 7.1  --   BILITOT 1.1  --   ALKPHOS 94  --   ALT 27  --   AST 38  --   GLUCOSE 169* 115*   No results found for: CHOL, HDL, LDLCALC, TRIG No results found for: DDIMER  Radiology/Studies:  Dg Elbow Complete Right  Result Date: 10/29/2017 CLINICAL DATA:  Recent fall while walking dog EXAM: RIGHT ELBOW - COMPLETE 3+ VIEW COMPARISON:  None. FINDINGS: Degenerative changes about the elbow joint are noted. Olecranon spurring is seen. No acute fracture or dislocation is noted. IMPRESSION: Degenerative change without acute abnormality. Electronically Signed   By: Alcide Clever M.D.   On: 10/29/2017 13:14   Dg Wrist Complete Right  Result Date: 10/29/2017 CLINICAL DATA:  Recent fall with wrist pain, initial encounter EXAM: RIGHT WRIST - COMPLETE 3+ VIEW COMPARISON:  None. FINDINGS: Comminuted distal radial and ulnar fractures are noted. Some impaction and displacement of the fracture fragments at the fracture site is noted. There appears to be some articular extension in the distal radius. Considerable soft tissue deformity is noted. IMPRESSION: Comminuted distal radial and ulnar fractures with apparent intra-articular involvement in the distal radius. Multiple displaced fractures are noted anteriorly. Electronically Signed   By: Alcide Clever M.D.   On: 10/29/2017 12:44   Ct Head Wo Contrast  Result Date: 10/29/2017 CLINICAL DATA:  62 year old male with acute head injury following fall last night. Initial encounter. EXAM: CT HEAD WITHOUT CONTRAST TECHNIQUE: Contiguous axial images were obtained from the base of the skull through the vertex without intravenous contrast. COMPARISON:  None. FINDINGS: Brain: No evidence of acute  infarction, hemorrhage, hydrocephalus, extra-axial collection or mass lesion/mass effect. Mild atrophy and mild probable chronic small-vessel white matter ischemic changes noted. Vascular: Intracranial carotid atherosclerotic calcifications noted. Skull: Normal. Negative for fracture or focal lesion. Sinuses/Orbits: No acute finding. Other: Mild RIGHT anterior scalp soft tissue swelling noted. IMPRESSION: 1. No evidence of acute intracranial abnormality. 2. RIGHT anterior scalp soft tissue swelling without underlying fracture. 3. Mild atrophy and mild probable chronic small-vessel white matter ischemic changes. Electronically Signed   By: Harmon Pier M.D.   On: 10/29/2017 12:59    Wt Readings  from Last 3 Encounters:  10/29/17 81.6 kg (180 lb)    EKG: Atrial fibrillation with right bundle branch block and controlled ventricular response  Physical Exam: No chest pain or shortness of breath.  Right arm pain. Blood pressure (!) 154/109, pulse 77, temperature 98.2 F (36.8 C), temperature source Oral, resp. rate 18, height 6' (1.829 m), weight 81.6 kg (180 lb), SpO2 99 %. Body mass index is 24.41 kg/m. General: Well developed, well nourished, in no acute distress. Head: Normocephalic, atraumatic, sclera non-icteric, no xanthomas, nares are without discharge.  Neck: Negative for carotid bruits. JVD not elevated. Lungs: Clear bilaterally to auscultation without wheezes, rales, or rhonchi. Breathing is unlabored. Heart: Irregularly irregular rhythm. No murmurs, rubs, or gallops appreciated. Abdomen: Soft, non-tender, non-distended with normoactive bowel sounds. No hepatomegaly. No rebound/guarding. No obvious abdominal masses. Msk:  Strength and tone appear normal for age. Extremities: No clubbing or cyanosis. No edema.  Distal pedal pulses are 2+ and equal bilaterally.  Right arm distal pulses not palpable secondary to splints. Neuro: Alert and oriented X 3. No facial asymmetry. No focal deficit. Moves  all extremities spontaneously. Psych:  Responds to questions appropriately with a normal affect.     Assessment and Plan  62 year old male with history of no medical problems admitted after a mechanical fall causing fracture of his right radial and ulnar distally.  He was noted to be in atrial fibrillation with which had not been previously noted.  Patient does drink alcohol which may be playing a role however this also may be secondary to the fall.  He did not have syncope.  His rate is well controlled.  He is very vigorous as an outpatient with no symptoms.  Echocardiogram has been ordered.  We will review this however given the patient's vigorous physical activity as an outpatient, he appears to be at low risk from cardiac standpoint for surgery although will evaluate the echo prior to making final assessment.  Will likely be able to proceed with surgery with routine cardiac monitoring    Signed, Dalia Heading MD 10/30/2017, 10:12 AM Pager: [phone]   Addendum:  Echo revealed mildly reduce lv function ef approx. 40% with no significant valvular abnormalities. OK to proceed with surgery.

## 2017-10-30 NOTE — Transfer of Care (Signed)
Immediate Anesthesia Transfer of Care Note  Patient: James Sanford  Procedure(s) Performed: OPEN REDUCTION INTERNAL FIXATION (ORIF) DISTAL RADIAL FRACTURE (Right )  Patient Location: PACU  Anesthesia Type:General and Regional  Level of Consciousness: awake  Airway & Oxygen Therapy: Patient Spontanous Breathing and Patient connected to face mask oxygen  Post-op Assessment: Report given to RN and Post -op Vital signs reviewed and stable  Post vital signs: Reviewed and stable  Last Vitals:  Vitals Value Taken Time  BP 140/99 10/30/2017  7:40 PM  Temp    Pulse 65 10/30/2017  7:40 PM  Resp 6 10/30/2017  7:40 PM  SpO2 100 % 10/30/2017  7:40 PM  Vitals shown include unvalidated device data.  Last Pain:  Vitals:   10/30/17 1524  TempSrc: Oral  PainSc:       Patients Stated Pain Goal: 3 (10/30/17 1234)  Complications: No apparent anesthesia complications

## 2017-10-30 NOTE — Anesthesia Post-op Follow-up Note (Signed)
Anesthesia QCDR form completed.        

## 2017-10-30 NOTE — Progress Notes (Signed)
[facility] - [place] [facility]   PATIENT NAME: James Sanford    MR#:  [id number]  DATE OF BIRTH:  1955-07-11  SUBJECTIVE:  CHIEF COMPLAINT:   Chief Complaint  Patient presents with  . Fall  Patient seen and evaluated today Has pain in the right upper extremity and right wrist No complaints of any chest pain, palpitations  REVIEW OF SYSTEMS:    ROS  CONSTITUTIONAL: No documented fever. No fatigue, weakness. No weight gain, no weight loss.  EYES: No blurry or double vision.  ENT: No tinnitus. No postnasal drip. No redness of the oropharynx.  RESPIRATORY: No cough, no wheeze, no hemoptysis. No dyspnea.  CARDIOVASCULAR: No chest pain. No orthopnea. No palpitations. No syncope.  GASTROINTESTINAL: No nausea, no vomiting or diarrhea. No abdominal pain. No melena or hematochezia.  GENITOURINARY: No dysuria or hematuria.  ENDOCRINE: No polyuria or nocturia. No heat or cold intolerance.  HEMATOLOGY: No anemia. No bruising. No bleeding.  INTEGUMENTARY: No rashes. No lesions.  MUSCULOSKELETAL: No arthritis. No swelling. No gout.  Has pain in the right wrist NEUROLOGIC: No numbness, tingling, or ataxia. No seizure-type activity.  PSYCHIATRIC: No anxiety. No insomnia. No ADD.   DRUG ALLERGIES:  No Known Allergies  VITALS:  Blood pressure (!) 141/110, pulse 84, temperature 98.2 F (36.8 C), temperature source Oral, resp. rate 18, height 6' (1.829 m), weight 81.6 kg (180 lb), SpO2 98 %.  PHYSICAL EXAMINATION:   Physical Exam  GENERAL:  62 y.o.-year-old patient lying in the bed with no acute distress.  EYES: Pupils equal, round, reactive to light and accommodation. No scleral icterus. Extraocular muscles intact.  HEENT: Head atraumatic, normocephalic. Oropharynx and nasopharynx clear.  NECK:  Supple, no jugular venous distention. No thyroid enlargement, no tenderness.  LUNGS: Normal breath sounds bilaterally, no wheezing, rales, rhonchi. No use of accessory  muscles of respiration.  CARDIOVASCULAR: S1, S2 irregular. No murmurs, rubs, or gallops.  ABDOMEN: Soft, nontender, nondistended. Bowel sounds present. No organomegaly or mass.  EXTREMITIES: No cyanosis, clubbing or edema b/l.    Has sling to right upper extremity NEUROLOGIC: Cranial nerves II through XII are intact. No focal Motor or sensory deficits b/l.   PSYCHIATRIC: The patient is alert and oriented x 3.  SKIN: No obvious rash, lesion, or ulcer.   LABORATORY PANEL:   CBC Recent Labs  Lab 10/30/17 0511  WBC 7.6  HGB 13.4  HCT 39.4*  PLT 141*   ------------------------------------------------------------------------------------------------------------------ Chemistries  Recent Labs  Lab 10/29/17 1305 10/30/17 0511  NA 143 140  K 4.3 3.9  CL 107 107  CO2 29 28  GLUCOSE 169* 115*  BUN 12 11  CREATININE 0.63 0.70  CALCIUM 8.3* 8.0*  AST 38  --   ALT 27  --   ALKPHOS 94  --   BILITOT 1.1  --    ------------------------------------------------------------------------------------------------------------------  Cardiac Enzymes No results for input(s): TROPONINI in the last 168 hours. ------------------------------------------------------------------------------------------------------------------  RADIOLOGY:  Dg Elbow Complete Right  Result Date: 10/29/2017 CLINICAL DATA:  Recent fall while walking dog EXAM: RIGHT ELBOW - COMPLETE 3+ VIEW COMPARISON:  None. FINDINGS: Degenerative changes about the elbow joint are noted. Olecranon spurring is seen. No acute fracture or dislocation is noted. IMPRESSION: Degenerative change without acute abnormality. Electronically Signed   By: Alcide Clever M.D.   On: 10/29/2017 13:14   Dg Wrist Complete Right  Result Date: 10/29/2017 CLINICAL DATA:  Recent fall with wrist pain, initial encounter EXAM: RIGHT WRIST - COMPLETE  3+ VIEW COMPARISON:  None. FINDINGS: Comminuted distal radial and ulnar fractures are noted. Some impaction and  displacement of the fracture fragments at the fracture site is noted. There appears to be some articular extension in the distal radius. Considerable soft tissue deformity is noted. IMPRESSION: Comminuted distal radial and ulnar fractures with apparent intra-articular involvement in the distal radius. Multiple displaced fractures are noted anteriorly. Electronically Signed   By: Alcide Clever M.D.   On: 10/29/2017 12:44   Ct Head Wo Contrast  Result Date: 10/29/2017 CLINICAL DATA:  62 year old male with acute head injury following fall last night. Initial encounter. EXAM: CT HEAD WITHOUT CONTRAST TECHNIQUE: Contiguous axial images were obtained from the base of the skull through the vertex without intravenous contrast. COMPARISON:  None. FINDINGS: Brain: No evidence of acute infarction, hemorrhage, hydrocephalus, extra-axial collection or mass lesion/mass effect. Mild atrophy and mild probable chronic small-vessel white matter ischemic changes noted. Vascular: Intracranial carotid atherosclerotic calcifications noted. Skull: Normal. Negative for fracture or focal lesion. Sinuses/Orbits: No acute finding. Other: Mild RIGHT anterior scalp soft tissue swelling noted. IMPRESSION: 1. No evidence of acute intracranial abnormality. 2. RIGHT anterior scalp soft tissue swelling without underlying fracture. 3. Mild atrophy and mild probable chronic small-vessel white matter ischemic changes. Electronically Signed   By: Harmon Pier M.D.   On: 10/29/2017 12:59     ASSESSMENT AND PLAN:   62 year old male patient with history of diabetes mellitus type 2, hypertension, hyperlipidemia, sleep apnea currently under hospitalist service for fall  -Right radius and ulna fracture Orthopedic surgery follow-up N.p.o. for now for surgical fixation  -New onset atrial fibrillation Cardiac monitoring Check echocardiogram and cardiology consultation  -Uncontrolled hypertension Continue Norvasc for control of blood  pressure Add as needed IV hydralazine for better control of blood pressure  -Right wrist pain secondary to fracture IV morphine for pain management along with oxycodone  All the records are reviewed and case discussed with Care Management/Social Worker. Management plans discussed with the patient, family and they are in agreement.  CODE STATUS: Full code  DVT Prophylaxis: SCDs  TOTAL TIME TAKING CARE OF THIS PATIENT: 32 minutes.   POSSIBLE D/C IN 2 to 3 DAYS, DEPENDING ON CLINICAL CONDITION.  Ihor Austin M.D on 10/30/2017 at 12:29 PM  Between 7am to 6pm - Pager - [phone]  After 6pm go to [email or web address] - password [email or web address]  [facility]  Office  [phone]  CC: Primary care physician; Jerl Mina, MD  Note: This dictation was prepared with Dragon dictation along with smaller phrase technology. Any transcriptional errors that result from this process are unintentional.

## 2017-10-31 LAB — BASIC METABOLIC PANEL
ANION GAP: 9 (ref 5–15)
BUN: 10 mg/dL (ref 6–20)
CO2: 27 mmol/L (ref 22–32)
Calcium: 8 mg/dL — ABNORMAL LOW (ref 8.9–10.3)
Chloride: 104 mmol/L (ref 98–111)
Creatinine, Ser: 0.41 mg/dL — ABNORMAL LOW (ref 0.61–1.24)
GFR calc Af Amer: >60 mL/min (ref >60)
GLUCOSE: 101 mg/dL — AB (ref 70–99)
POTASSIUM: 3.9 mmol/L (ref 3.5–5.1)
Sodium: 140 mmol/L (ref 135–145)

## 2017-10-31 LAB — CBC
HEMATOCRIT: 38.4 % — AB (ref 40.0–52.0)
Hemoglobin: 13.3 g/dL (ref 13.0–18.0)
MCH: 33.8 pg (ref 26.0–34.0)
MCHC: 34.7 g/dL (ref 32.0–36.0)
MCV: 97.3 fL (ref 80.0–100.0)
PLATELETS: 128 10*3/uL — AB (ref 150–440)
RBC: 3.95 MIL/uL — AB (ref 4.40–5.90)
RDW: 13.8 % (ref 11.5–14.5)
WBC: 7.1 10*3/uL (ref 3.8–10.6)

## 2017-10-31 MED ORDER — DILTIAZEM HCL 30 MG PO TABS
60.0000 mg | ORAL_TABLET | Freq: Four times a day (QID) | ORAL | Status: DC
  Administered 2017-10-31: 60 mg via ORAL
  Filled 2017-10-31 (×2): qty 2

## 2017-10-31 MED ORDER — HYDRALAZINE HCL 25 MG PO TABS: 25.0000 mg | ORAL_TABLET | Freq: Two times a day (BID) | ORAL | 0 refills | Status: DC

## 2017-10-31 MED ORDER — METOPROLOL TARTRATE 25 MG PO TABS
25.0000 mg | ORAL_TABLET | Freq: Two times a day (BID) | ORAL | Status: DC
  Administered 2017-10-31: 25 mg via ORAL
  Filled 2017-10-31: qty 1

## 2017-10-31 MED ORDER — ASPIRIN 325 MG PO TBEC: 325.0000 mg | DELAYED_RELEASE_TABLET | Freq: Every day | ORAL | 0 refills | Status: DC

## 2017-10-31 MED ORDER — HYDRALAZINE HCL 25 MG PO TABS
25.0000 mg | ORAL_TABLET | Freq: Once | ORAL | Status: AC
  Administered 2017-10-31: 25 mg via ORAL
  Filled 2017-10-31: qty 1

## 2017-10-31 MED ORDER — OXYCODONE HCL 5 MG PO TABS: 5.0000 mg | ORAL_TABLET | ORAL | 0 refills | Status: DC | PRN

## 2017-10-31 MED ORDER — METOPROLOL TARTRATE 25 MG PO TABS: 25.0000 mg | ORAL_TABLET | Freq: Two times a day (BID) | ORAL | 0 refills | Status: DC

## 2017-10-31 NOTE — Evaluation (Signed)
Occupational Therapy Evaluation Patient Details Name: James Sanford MRN: [id number] DOB: [birth year] Today's Date: 10/31/2017    History of Present Illness Pt is a 62 y.o. male presenting to hospital 10/29/17 s/p fall at night on concrete steps taking dog out.  Imaging showing R distal radial and ulnar fx's.  Pt also noted with new onset a-fib (a-symptomatic) and uncontrolled htn.  Pt s/p ORIF distal radial fx and I&D R distal radius grade 1 open fx 10/30/17.  PMH includes DM, htn, gastric bypass, B TKR, and hernia repair.    Clinical Impression   Pt seen for OT evaluation this date. Prior to hospital admission, pt was independent and active, walking 10-12 miles per day and working.  Pt lives with his spouse in a 2 story home with a chair lift.  Currently pt demonstrates impairments in RUE functional use secondary to pain and immobilization. Pt/spouse instructed in compression stocking mgt, falls prevention strategies, hemi techniques for dressing, and activity pacing and work simplification to support cardiac health. Pt/spouse verbalized understanding. Pt would benefit from skilled OT while in the hospital to address noted impairments and functional limitations (see below for any additional details) in order to maximize safety and independence while minimizing falls risk and caregiver burden.  Upon hospital discharge, recommend pt discharge home with follow up therapy as arranged by the surgeon.     Follow Up Recommendations  Follow surgeon's recommendation for DC plan and follow-up therapies    Equipment Recommendations  None recommended by OT    Recommendations for Other Services       Precautions / Restrictions Precautions Precautions: Fall Precaution Comments: R arm to remain in sling Required Braces or Orthoses: Sling Restrictions Weight Bearing Restrictions: Yes Other Position/Activity Restrictions: Per orders: WBAT operative/affected extremity      Mobility Bed  Mobility               General bed mobility comments: Deferred (pt up in chair beginning and end of session)  Transfers Overall transfer level: Independent Equipment used: None             General transfer comment: steady safe transfers noted during session    Balance Overall balance assessment: Needs assistance Sitting-balance support: No upper extremity supported;Feet supported Sitting balance-Leahy Scale: Normal Sitting balance - Comments: steady sitting reaching outside BOS   Standing balance support: No upper extremity supported;During functional activity Standing balance-Leahy Scale: Good Standing balance comment: steady ambulation (no UE support); no loss of balance with ambulation and head turns R/L/up/down                           ADL either performed or assessed with clinical judgement   ADL Overall ADL's : Needs assistance/impaired Eating/Feeding: Independent   Grooming: Modified independent   Upper Body Bathing: With caregiver independent assisting;Modified independent   Lower Body Bathing: Modified independent;With caregiver independent assisting   Upper Body Dressing : Sitting;Modified independent;With caregiver independent assisting Upper Body Dressing Details (indicate cue type and reason): pt/spouse instructed in hemi techniques for UB dressing to improve independence and safety Lower Body Dressing: Modified independent;With caregiver independent assisting   Toilet Transfer: Supervision/safety           Functional mobility during ADLs: Supervision/safety       Vision Baseline Vision/History: Wears glasses Wears Glasses: At all times Patient Visual Report: No change from baseline       Perception     Praxis  Pertinent Vitals/Pain Pain Assessment: 0-10 Pain Score: 1  Pain Location: RUE Pain Descriptors / Indicators: Aching Pain Intervention(s): Limited activity within patient's tolerance;Monitored during  session;Repositioned     Hand Dominance Right   Extremity/Trunk Assessment Upper Extremity Assessment Upper Extremity Assessment: RUE deficits/detail(LUE WFL) RUE Deficits / Details: RUE in sling, mild swelling noted in R hand, hand ROM intact RUE: Unable to fully assess due to immobilization   Lower Extremity Assessment Lower Extremity Assessment: Overall WFL for tasks assessed   Cervical / Trunk Assessment Cervical / Trunk Assessment: Normal   Communication Communication Communication: No difficulties   Cognition Arousal/Alertness: Awake/alert Behavior During Therapy: WFL for tasks assessed/performed Overall Cognitive Status: Within Functional Limits for tasks assessed                                 General Comments: Pt did appear to have some difficulty remembering things during session (per chart--prior to admission, pt has had some difficulty with memory; also slurred speech in evenings)   General Comments  drainage noted on pillow under R UE (nurse notifed and clean pillowcase placed on pillow)    Exercises Other Exercises Other Exercises: pt/spouse instructed in compression stocking mgt Other Exercises: pt/spouse instructed in positioning of RUE and AROM of R hand/fingers to support decreased swelling and stiffness Other Exercises: pt/spouse instructed in falls prevention strategies Other Exercises: pt/spouse educated in activity pacing and work simplification to support cardiac health    Shoulder Instructions      Home Living Family/patient expects to be discharged to:: Private residence Living Arrangements: Spouse/significant other Available Help at Discharge: Family Type of Home: House Home Access: Stairs to enter Secretary/administrator of Steps: 5 Entrance Stairs-Rails: Left Home Layout: Two level;1/2 bath on main level;Able to live on main level with bedroom/bathroom Alternate Level Stairs-Number of Steps: flight of stairs with B railings to  2nd floor (also has chair lift he can use)   Bathroom Shower/Tub: Tub/shower unit;Walk-in shower((both on 2nd floor)   Bathroom Toilet: Handicapped height     Home Equipment: Other (comment)(Chair lift)          Prior Functioning/Environment Level of Independence: Independent        Comments: 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  Pt reports no other falls in past 6 months.        OT Problem List: Decreased range of motion;Decreased strength;Increased edema;Impaired UE functional use;Pain      OT Treatment/Interventions: Self-care/ADL training;Therapeutic exercise;Therapeutic activities;DME and/or AE instruction;Patient/family education    OT Goals(Current goals can be found in the care plan section) Acute Rehab OT Goals Patient Stated Goal: to go home OT Goal Formulation: With patient/family Time For Goal Achievement: 11/14/17 Potential to Achieve Goals: Good  OT Frequency: Min 1X/week   Barriers to D/C:            Co-evaluation              AM-PAC PT "6 Clicks" Daily Activity     Outcome Measure Help from another person eating meals?: None Help from another person taking care of personal grooming?: None Help from another person toileting, which includes using toliet, bedpan, or urinal?: None Help from another person bathing (including washing, rinsing, drying)?: A Little Help from another person to put on and taking off regular upper body clothing?: None Help from another person to put on and taking off regular lower body clothing?:  A Little 6 Click Score: 22   End of Session    Activity Tolerance: Patient tolerated treatment well Patient left: in chair;with call bell/phone within reach;with chair alarm set;with family/visitor present;Other (comment)(RUE in sling, positioned on pillows)  OT Visit Diagnosis: Other abnormalities of gait and mobility (R26.89)                Time: 9432-7614 OT Time Calculation (min): 13 min Charges:  OT  General Charges $OT Visit: 1 Visit OT Evaluation $OT Eval Low Complexity: 1 Low  Richrd Prime, MPH, MS, OTR/L ascom [phone] 10/31/17, 1:08 PM

## 2017-10-31 NOTE — Evaluation (Signed)
Physical Therapy Evaluation Patient Details Name: James Sanford MRN: [id number] DOB: 10/19/1955 Today's Date: 10/31/2017   History of Present Illness  Pt is a 62 y.o. male presenting to hospital 10/29/17 s/p fall at night on concrete steps taking dog out.  Imaging showing R distal radial and ulnar fx's.  Pt also noted with new onset a-fib (a-symptomatic) and uncontrolled htn.  Pt s/p ORIF distal radial fx and I&D R distal radius grade 1 open fx 10/30/17.  PMH includes DM, htn, gastric bypass, B TKR, and hernia repair.   Clinical Impression  Prior to hospital admission, pt was independent and active.  Pt lives with his wife in 2 level home with 5 steps to enter (L railing); plans to sleep on main floor.  Currently pt is independent with transfers, SBA with ambulation around nursing loop (no UE support), and SBA with pt navigating stairs with railing.  Pt's HR 107 bpm at rest beginning of session; fluctuating in 120's during ambulation (and briefly up to 134 bpm); fluctuating upper 120's and mid 130's with stairs navigation (briefly up to 140 bpm); and decreased to 106 bpm end of session resting in chair (nursing notified of above HR).  Sitting rest breaks given during session to allow HR to decrease.  R UE in sling during session; no c/o pain.  Overall pt steady without any loss of balance during session's activities.  Pt educated on sling use and positioning to decrease swelling in distal R UE.  Pt would benefit from skilled PT to address noted impairments and functional limitations during hospital stay (see below for any additional details).  Upon hospital discharge, recommend pt discharge to home with support of wife.    Follow Up Recommendations Follow surgeon's recommendation for DC plan and follow-up therapies    Equipment Recommendations  None recommended by PT    Recommendations for Other Services OT consult     Precautions / Restrictions Precautions Precautions: Fall Precaution  Comments: R arm to remain in sling Required Braces or Orthoses: Sling Restrictions Weight Bearing Restrictions: Yes Other Position/Activity Restrictions: Per orders: WBAT operative/affected extremity      Mobility  Bed Mobility               General bed mobility comments: Deferred (pt up in chair beginning and end of session)  Transfers Overall transfer level: Independent Equipment used: None             General transfer comment: steady safe transfers noted during session  Ambulation/Gait Ambulation/Gait assistance: Supervision Gait Distance (Feet): (200; 100 feet) Assistive device: None Gait Pattern/deviations: Step-through pattern Gait velocity: mildly decreased   General Gait Details: steady ambulation  Stairs Stairs: Yes Stairs assistance: Supervision Stair Management: One rail Left(alternating forward ascending; step to side-stepping descending) Number of Stairs: 8 General stair comments: steady and safe stairs navigation using railing  Wheelchair Mobility    Modified Rankin (Stroke Patients Only)       Balance Overall balance assessment: Needs assistance Sitting-balance support: No upper extremity supported;Feet supported Sitting balance-Leahy Scale: Normal Sitting balance - Comments: steady sitting reaching outside BOS   Standing balance support: No upper extremity supported;During functional activity Standing balance-Leahy Scale: Good Standing balance comment: steady ambulation (no UE support); no loss of balance with ambulation and head turns R/L/up/down                             Pertinent Vitals/Pain Pain Assessment: No/denies pain  O2 sats on room air [facility] during session.    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  Pt reports no other falls in past 6 months.     Hand Dominance        Extremity/Trunk Assessment   Upper Extremity Assessment Upper Extremity Assessment: Defer to OT evaluation    Lower Extremity Assessment Lower Extremity Assessment: Overall WFL for tasks assessed    Cervical / Trunk Assessment Cervical / Trunk Assessment: Normal  Communication   Communication: No difficulties  Cognition Arousal/Alertness: Awake/alert Behavior During Therapy: WFL for tasks assessed/performed Overall Cognitive Status: Within Functional Limits for tasks assessed                                 General Comments: Pt did appear to have some difficulty remembering things during session (per chart--prior to admission, pt has had some difficulty with memory; also slurred speech in evenings)      General Comments General comments (skin integrity, edema, etc.): drainage noted on pillow under R UE (nurse notifed and clean pillowcase placed on pillow).  Nursing cleared pt for participation in physical therapy.  Pt agreeable to PT session.  Pt's wife present during session.    Exercises     Assessment/Plan    PT Assessment Patient needs continued PT services  PT Problem List Decreased balance;Decreased mobility;Decreased knowledge of precautions;Cardiopulmonary status limiting activity;Pain       PT Treatment Interventions DME instruction;Gait training;Stair training;Functional mobility training;Therapeutic activities;Therapeutic exercise;Balance training;Patient/family education    PT Goals (Current goals can be found in the Care Plan section)  Acute Rehab PT Goals Patient Stated Goal: to go home PT Goal Formulation: With patient Time For Goal  Achievement: 11/14/17 Potential to Achieve Goals: Good    Frequency 7X/week   Barriers to discharge        Co-evaluation               AM-PAC PT "6 Clicks" Daily Activity  Outcome Measure Difficulty turning over in bed (including adjusting bedclothes, sheets and blankets)?: A Little Difficulty moving from lying on back to sitting on the side of the bed? : A Little Difficulty sitting down on and standing up from a chair with arms (e.g., wheelchair, bedside commode, etc,.)?: None Help needed moving to and from a bed to chair (including a wheelchair)?: None Help needed walking in hospital room?: None Help needed climbing 3-5 steps with a railing? : A Little 6 Click Score: 21    End of Session Equipment Utilized During Treatment: Gait belt Activity Tolerance: Patient tolerated treatment well Patient left: in chair;with call bell/phone within reach;with chair alarm set;with family/visitor present;Other (comment)(R UE supported on pillows) Nurse Communication: Mobility status;Precautions;Weight bearing status PT Visit Diagnosis: Other abnormalities of gait and mobility (R26.89);History of falling (Z91.81);Difficulty in walking, not elsewhere classified (R26.2)    Time: 1610-9604 PT Time Calculation (min) (ACUTE ONLY): 35 min   Charges:   PT Evaluation $PT Eval Low Complexity: 1 Low PT Treatments $Gait Training: 8-22 mins  Hendricks Limes, PT 10/31/17, 11:16 AM [phone]

## 2017-10-31 NOTE — Discharge Summary (Signed)
[facility] [place] [facility]   PATIENT NAME: James Sanford    MR#:  [id number]  DATE OF BIRTH:  1955/12/23  DATE OF ADMISSION:  10/29/2017 ADMITTING PHYSICIAN: Enid Baas, MD  DATE OF DISCHARGE: 10/31/2017  PRIMARY CARE PHYSICIAN: Jerl Mina, MD   ADMISSION DIAGNOSIS:  Type I or II open fracture of distal end of right radius with ulna, initial encounter [S52.501B, S52.601B] Atrial flutter, unspecified type (HCC) [I48.92] Diabetes mellitus type 2 Hyperlipidemia Hypertension DISCHARGE DIAGNOSIS:  Active Problems:   Radius fracture Atrial fibrillation Hypertension Hyperlipidemia  SECONDARY DIAGNOSIS:   Past Medical History:  Diagnosis Date  . Arthritis   . Diabetes mellitus without complication (HCC)   . Hyperlipemia   . Hypertension   . Sleep apnea      ADMITTING HISTORY James Sanford  is a 62 y.o. male with no significant past medical history and not taking any medications at home currently presents to hospital secondary to fall and right hand pain.Patient is a poor historian. He says he got up in the middle of the night to take his dog out, he slipped and fell on the concrete steps and felt like he landed on his outstretched hand. Came with swelling and pain off the right forearm and hand. X-ray showing community distal radial and ulnar fractures. Patient denies any other falls. Denies significant alcohol use though he drinks her beer occasionally. No history of strokes or mini strokes. Denies any lightheadedness or dizziness or chest pain or palpitations. Labs are unremarkable. EKG showing new onset atrial fibrillation that he was not aware of. But not symptomatic at all. CT of the head showing mild atrophy and small vessel ischemic changes no acute abnormality. There is some right sided soft tissue swelling of the scalp from the fall. Ortho has been consulted.  HOSPITAL COURSE:  Patient admitted to medical floor.  Orthopedic surgery  evaluation was done.  Patient was in atrial fibrillation cardiology consultation and echocardiogram was done.  Heart rate was controlled with Cardizem orally.  Patient underwent open reduction internal fixation of the distal radius fracture.  Patient tolerated procedure well.  His blood pressure was controlled with oral hydralazine and metoprolol.  Orthopedic surgery follow-up was done postoperatively.  Patient will be discharged home on aspirin, blood pressure medications and follow-up with cardiology in clinic.  He will receive physical therapy and occupational therapy at home and further recommendations as per orthopedic surgery.  CONSULTS OBTAINED:  Treatment Team:  Garnette Gunner, MD Dalia Heading, MD Donato Heinz, MD  DRUG ALLERGIES:  No Known Allergies  DISCHARGE MEDICATIONS:   Allergies as of 10/31/2017   No Known Allergies     Medication List    TAKE these medications   aspirin 325 MG EC tablet Take 1 tablet (325 mg total) by mouth daily with breakfast.   hydrALAZINE 25 MG tablet Commonly known as:  APRESOLINE Take 1 tablet (25 mg total) by mouth 2 (two) times daily.   metoprolol tartrate 25 MG tablet Commonly known as:  LOPRESSOR Take 1 tablet (25 mg total) by mouth 2 (two) times daily.   oxyCODONE 5 MG immediate release tablet Commonly known as:  Oxy IR/ROXICODONE Take 1 tablet (5 mg total) by mouth every 4 (four) hours as needed for severe pain (pain score 7-10).       Today  Patient seen and evaluated on the day of discharge No chest pain No shortness of breath Tolerating pain well  VITAL SIGNS:  Blood  pressure (!) 134/97, pulse 66, temperature 98.9 F (37.2 C), temperature source Oral, resp. rate 16, height 6' (1.829 m), weight 81.6 kg (180 lb), SpO2 98 %.  I/O:    Intake/Output Summary (Last 24 hours) at 10/31/2017 1512 Last data filed at 10/31/2017 2094 Gross per 24 hour  Intake 1360 ml  Output 245 ml  Net 1115 ml    PHYSICAL EXAMINATION:   Physical Exam  GENERAL:  62 y.o.-year-old patient lying in the bed with no acute distress.  LUNGS: Normal breath sounds bilaterally, no wheezing, rales,rhonchi or crepitation. No use of accessory muscles of respiration.  CARDIOVASCULAR: S1, S2 normal. No murmurs, rubs, or gallops.  ABDOMEN: Soft, non-tender, non-distended. Bowel sounds present. No organomegaly or mass.  NEUROLOGIC: Moves all 4 extremities. PSYCHIATRIC: The patient is alert and oriented x 3.  SKIN: No obvious rash, lesion, or ulcer.   DATA REVIEW:   CBC Recent Labs  Lab 10/31/17 0335  WBC 7.1  HGB 13.3  HCT 38.4*  PLT 128*    Chemistries  Recent Labs  Lab 10/29/17 1305  10/31/17 0335  NA 143   < > 140  K 4.3   < > 3.9  CL 107   < > 104  CO2 29   < > 27  GLUCOSE 169*   < > 101*  BUN 12   < > 10  CREATININE 0.63   < > 0.41*  CALCIUM 8.3*   < > 8.0*  AST 38  --   --   ALT 27  --   --   ALKPHOS 94  --   --   BILITOT 1.1  --   --    < > = values in this interval not displayed.    Cardiac Enzymes No results for input(s): TROPONINI in the last 168 hours.  Microbiology Results  Results for orders placed or performed during the hospital encounter of 10/29/17  MRSA PCR Screening     Status: None   Collection Time: 10/29/17  6:52 PM  Result Value Ref Range Status   MRSA by PCR NEGATIVE NEGATIVE Final    Comment:        The GeneXpert MRSA Assay (FDA approved for NASAL specimens only), is one component of a comprehensive MRSA colonization surveillance program. It is not intended to diagnose MRSA infection nor to guide or monitor treatment for MRSA infections. Performed at [facility], [address] [place], [place] [address]     RADIOLOGY:  No results found.  Follow up with PCP in 1 week.  Management plans discussed with the patient, family and they are in agreement.  CODE STATUS: Full code    Code Status Orders  (From admission, onward)        Start     Ordered    10/29/17 1712  Full code  Continuous     10/29/17 1711    Code Status History    This patient has a current code status but no historical code status.      TOTAL TIME TAKING CARE OF THIS PATIENT ON DAY OF DISCHARGE: more than 34 minutes.   Ihor Austin M.D on 10/31/2017 at 3:12 PM  Between 7am to 6pm - Pager - [phone]  After 6pm go to [email or web address] - password [email or web address]  [facility]  Office  [phone]  CC: Primary care physician; Jerl Mina, MD  Note: This dictation was prepared with Dragon dictation along with smaller phrase technology. Any transcriptional errors that  result from this process are unintentional.

## 2017-10-31 NOTE — Discharge Instructions (Signed)
INSTRUCTIONS AFTER Surgery  o Remove items at home which could result in a fall. This includes throw rugs or furniture in walking pathways o ICE to the affected joint every three hours while awake for 30 minutes at a time, for at least the first 3-5 days, and then as needed for pain and swelling.  Continue to use ice for pain and swelling. You may notice swelling that will progress down to the foot and ankle.  This is normal after surgery.  Elevate your leg when you are not up walking on it.   o Continue to use the breathing machine you got in the hospital (incentive spirometer) which will help keep your temperature down.  It is common for your temperature to cycle up and down following surgery, especially at night when you are not up moving around and exerting yourself.  The breathing machine keeps your lungs expanded and your temperature down.   DIET:  As you were doing prior to hospitalization, we recommend a well-balanced diet.  DRESSING / WOUND CARE / SHOWERING  Splint and dressing will remain intact until follow-up in 10 days for suture removal.  No showering.  ACTIVITY  o Increase activity slowly as tolerated, but follow the weight bearing instructions below.   o No driving for 6 weeks or until further direction given by your physician.  You cannot drive while taking narcotics.  o No lifting or carrying greater than 10 lbs. until further directed by your surgeon. o Avoid periods of inactivity such as sitting longer than an hour when not asleep. This helps prevent blood clots.  o You may return to work once you are authorized by your doctor.     WEIGHT BEARING  Weightbearing as tolerated on lower extremities   EXERCISES Finger range of motion.  CONSTIPATION  Constipation is defined medically as fewer than three stools per week and severe constipation as less than one stool per week.  Even if you have a regular bowel pattern at home, your normal regimen is likely to be disrupted  due to multiple reasons following surgery.  Combination of anesthesia, postoperative narcotics, change in appetite and fluid intake all can affect your bowels.   YOU MUST use at least one of the following options; they are listed in order of increasing strength to get the job done.  They are all available over the counter, and you may need to use some, POSSIBLY even all of these options:    Drink plenty of fluids (prune juice may be helpful) and high fiber foods Colace 100 mg by mouth twice a day  Senokot for constipation as directed and as needed Dulcolax (bisacodyl), take with full glass of water  Miralax (polyethylene glycol) once or twice a day as needed.  If you have tried all these things and are unable to have a bowel movement in the first 3-4 days after surgery call either your surgeon or your primary doctor.    If you experience loose stools or diarrhea, hold the medications until you stool forms back up.  If your symptoms do not get better within 1 week or if they get worse, check with your doctor.  If you experience "the worst abdominal pain ever" or develop nausea or vomiting, please contact the office immediately for further recommendations for treatment.   ITCHING:  If you experience itching with your medications, try taking only a single pain pill, or even half a pain pill at a time.  You can also use Benadryl  over the counter for itching or also to help with sleep.   TED HOSE STOCKINGS:  Use stockings on both legs until for at least 2 weeks or as directed by physician office. They may be removed at night for sleeping.  MEDICATIONS:  See your medication summary on the After Visit Summary that nursing will review with you.  You may have some home medications which will be placed on hold until you complete the course of blood thinner medication.  It is important for you to complete the blood thinner medication as prescribed.  PRECAUTIONS:  If you experience chest pain or shortness  of breath - call 911 immediately for transfer to the hospital emergency department.   If you develop a fever greater that 101 F, purulent drainage from wound, increased redness or drainage from wound, foul odor from the wound/dressing, or calf pain - CONTACT YOUR SURGEON.                                                   FOLLOW-UP APPOINTMENTS:  If you do not already have a post-op appointment, please call the office for an appointment to be seen by your surgeon.  Guidelines for how soon to be seen are listed in your After Visit Summary, but are typically between 1-4 weeks after surgery.  OTHER INSTRUCTIONS:   Remain in the sling until told to discontinue  MAKE SURE YOU:   Understand these instructions.   Get help right away if you are not doing well or get worse.    Thank you for letting us be a part of your medical care team.  It is a privilege we respect greatly.  We hope these instructions will help you stay on track for a fast and full recovery!

## 2017-10-31 NOTE — Progress Notes (Signed)
  Subjective: 1 Day Post-Op Procedure(s) (LRB): OPEN REDUCTION INTERNAL FIXATION (ORIF) DISTAL RADIAL FRACTURE (Right) Patient reports pain as mild.   Patient seen in rounds with Dr. Ernest Pine. Patient is well, and has had no acute complaints or problems Plan is to go Home after hospital stay. Negative for chest pain and shortness of breath Fever: no Gastrointestinal: Negative for nausea and vomiting  Objective: Vital signs in last 24 hours: Temp:  [97.7 F (36.5 C)-98.9 F (37.2 C)] 98.9 F (37.2 C) (07/29 0439) Pulse Rate:  [65-84] 77 (07/29 0439) Resp:  [6-24] 16 (07/29 0439) BP: (136-158)/(92-114) 136/92 (07/29 0439) SpO2:  [93 %-100 %] 94 % (07/29 0439)  Intake/Output from previous day:  Intake/Output Summary (Last 24 hours) at 10/31/2017 0720 Last data filed at 10/31/2017 0619 Gross per 24 hour  Intake 2020 ml  Output 245 ml  Net 1775 ml    Intake/Output this shift: No intake/output data recorded.  Labs: Recent Labs    10/29/17 1305 10/30/17 0511 10/31/17 0335  HGB 14.1 13.4 13.3   Recent Labs    10/30/17 0511 10/31/17 0335  WBC 7.6 7.1  RBC 4.00* 3.95*  HCT 39.4* 38.4*  PLT 141* 128*   Recent Labs    10/30/17 0511 10/31/17 0335  NA 140 140  K 3.9 3.9  CL 107 104  CO2 28 27  BUN 11 10  CREATININE 0.70 0.41*  GLUCOSE 115* 101*  CALCIUM 8.0* 8.0*   No results for input(s): LABPT, INR in the last 72 hours.   EXAM General - Patient is Alert and Oriented Extremity - Neurovascular intact Dorsiflexion/Plantar flexion intact Dressing/Incision - clean, dry, no drainage, with a splint intact Motor Function - intact, moving fingers well on exam.   Past Medical History:  Diagnosis Date  . Arthritis   . Diabetes mellitus without complication (HCC)   . Hyperlipemia   . Hypertension   . Sleep apnea     Assessment/Plan: 1 Day Post-Op Procedure(s) (LRB): OPEN REDUCTION INTERNAL FIXATION (ORIF) DISTAL RADIAL FRACTURE (Right) Active Problems:  Radius fracture  Estimated body mass index is 24.41 kg/m as calculated from the following:   Height as of this encounter: 6' (1.829 m).   Weight as of this encounter: 81.6 kg (180 lb). Advance diet Up with therapy D/C IV fluids  Discharge home after physical therapy. Follow-up at [facility] clinics in 10 days for stitch removal and casting.  DVT Prophylaxis - Aspirin Right arm to remain in sling.  Dedra Skeens, PA-C Orthopaedic Surgery 10/31/2017, 7:20 AM

## 2017-10-31 NOTE — Care Management (Signed)
Patient refusing any home health services. Paged Dr. Tobi Bastos with no answer.

## 2017-10-31 NOTE — Progress Notes (Signed)
Discharge instructions and medication details reviewed with patient and family. All questions answered. Prescriptions and AVS given to patient.

## 2017-10-31 NOTE — Progress Notes (Signed)
Patient refuses home health services. CM notified.

## 2017-10-31 NOTE — Progress Notes (Signed)
Clinical Social Worker (CSW) received SNF consult. PT is recommending "Follow surgeon's recommendation for DC plan and follow-up therapies." RN case manager aware of above. Please reconsult if future social work needs arise. CSW signing off.   Tregan Read, LCSW [phone]  

## 2017-11-01 ENCOUNTER — Encounter: Payer: Self-pay | Admitting: Orthopedic Surgery

## 2017-11-01 LAB — HIV ANTIBODY (ROUTINE TESTING W REFLEX): HIV Screen 4th Generation wRfx: NONREACTIVE

## 2017-11-07 DIAGNOSIS — I480 Paroxysmal atrial fibrillation: Secondary | ICD-10-CM | POA: Diagnosis present

## 2017-11-08 NOTE — Anesthesia Postprocedure Evaluation (Signed)
Anesthesia Post Note  Patient: James Sanford  Procedure(s) Performed: OPEN REDUCTION INTERNAL FIXATION (ORIF) DISTAL RADIAL FRACTURE (Right )  Patient location during evaluation: PACU Anesthesia Type: General Level of consciousness: awake and alert Pain management: pain level controlled Vital Signs Assessment: post-procedure vital signs reviewed and stable Respiratory status: spontaneous breathing, nonlabored ventilation, respiratory function stable and patient connected to nasal cannula oxygen Cardiovascular status: blood pressure returned to baseline and stable Postop Assessment: no apparent nausea or vomiting Anesthetic complications: no     Last Vitals:  Vitals:   10/31/17 1214 10/31/17 1323  BP: (!) 158/100 (!) 134/97  Pulse: 70 66  Resp:    Temp: 37.2 C   SpO2: 98%     Last Pain:  Vitals:   10/31/17 1214  TempSrc: Oral  PainSc:                  Lenard Simmer

## 2019-02-21 ENCOUNTER — Emergency Department: Payer: BC Managed Care – PPO

## 2019-02-21 ENCOUNTER — Emergency Department
Admission: EM | Admit: 2019-02-21 | Discharge: 2019-02-21 | Disposition: A | Discharge status: Home / Self Care | Payer: BC Managed Care – PPO | Attending: Emergency Medicine | Admitting: Emergency Medicine

## 2019-02-21 ENCOUNTER — Other Ambulatory Visit: Payer: Self-pay

## 2019-02-21 DIAGNOSIS — Y939 Activity, unspecified: Secondary | ICD-10-CM | POA: Insufficient documentation

## 2019-02-21 DIAGNOSIS — S2231XA Fracture of one rib, right side, initial encounter for closed fracture: Secondary | ICD-10-CM

## 2019-02-21 DIAGNOSIS — R413 Other amnesia: Secondary | ICD-10-CM | POA: Insufficient documentation

## 2019-02-21 DIAGNOSIS — Z7982 Long term (current) use of aspirin: Secondary | ICD-10-CM | POA: Insufficient documentation

## 2019-02-21 DIAGNOSIS — Z96653 Presence of artificial knee joint, bilateral: Secondary | ICD-10-CM | POA: Diagnosis not present

## 2019-02-21 DIAGNOSIS — Z87891 Personal history of nicotine dependence: Secondary | ICD-10-CM | POA: Diagnosis not present

## 2019-02-21 DIAGNOSIS — Z7901 Long term (current) use of anticoagulants: Secondary | ICD-10-CM | POA: Insufficient documentation

## 2019-02-21 DIAGNOSIS — W19XXXA Unspecified fall, initial encounter: Secondary | ICD-10-CM | POA: Diagnosis not present

## 2019-02-21 DIAGNOSIS — Y929 Unspecified place or not applicable: Secondary | ICD-10-CM | POA: Diagnosis not present

## 2019-02-21 DIAGNOSIS — S4991XA Unspecified injury of right shoulder and upper arm, initial encounter: Secondary | ICD-10-CM | POA: Diagnosis present

## 2019-02-21 DIAGNOSIS — F1010 Alcohol abuse, uncomplicated: Secondary | ICD-10-CM | POA: Diagnosis not present

## 2019-02-21 DIAGNOSIS — S42201A Unspecified fracture of upper end of right humerus, initial encounter for closed fracture: Secondary | ICD-10-CM | POA: Diagnosis not present

## 2019-02-21 DIAGNOSIS — E119 Type 2 diabetes mellitus without complications: Secondary | ICD-10-CM | POA: Insufficient documentation

## 2019-02-21 DIAGNOSIS — I10 Essential (primary) hypertension: Secondary | ICD-10-CM | POA: Insufficient documentation

## 2019-02-21 DIAGNOSIS — Y999 Unspecified external cause status: Secondary | ICD-10-CM | POA: Diagnosis not present

## 2019-02-21 DIAGNOSIS — S42191A Fracture of other part of scapula, right shoulder, initial encounter for closed fracture: Secondary | ICD-10-CM | POA: Diagnosis not present

## 2019-02-21 IMAGING — CT CT CHEST W/O CM
2 of 3 series · 15 of 36 positions shown, 18 images · non-contrast
Comparison: None.

CLINICAL DATA: Abdominal trauma, fall [REDACTED]

EXAM:
CT CHEST WITHOUT CONTRAST
TECHNIQUE: Multidetector CT imaging of the chest was performed following the
standard protocol without IV contrast.

[Series 2: thorax · axial · 0.94mm/px · z∈[-356,-40]mm · 12 of 186 slices shown, 15 images]
[im 14/186  mediastinal]
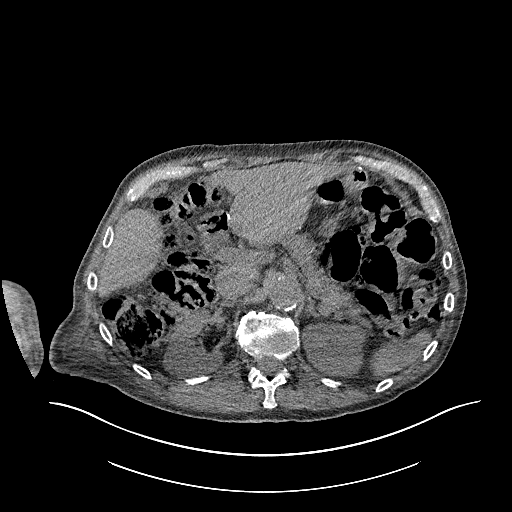
[im 14/186  lung]
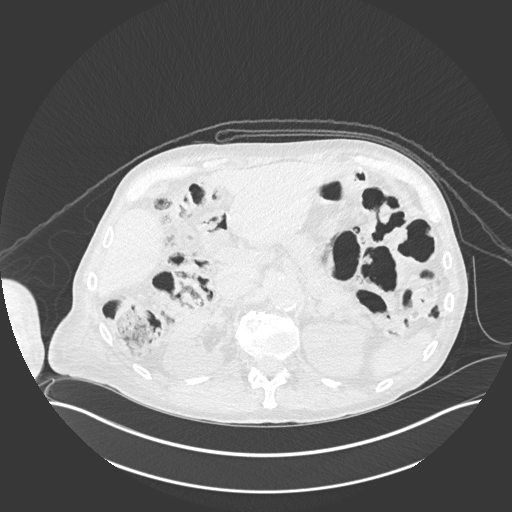
[im 28/186  lung]
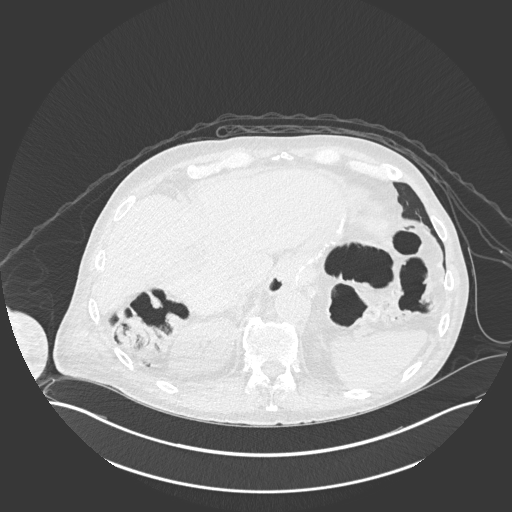
[im 42/186  lung]
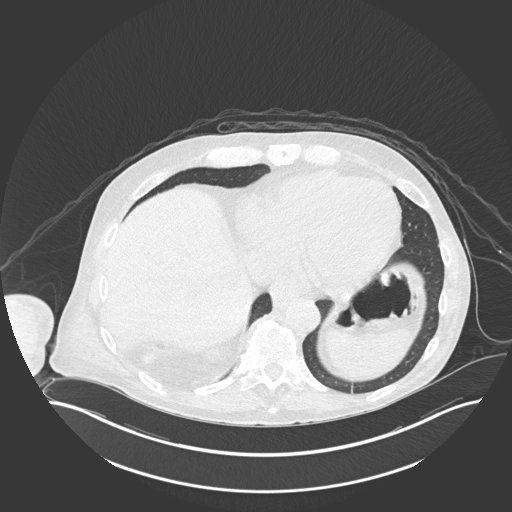
[im 55/186  lung]
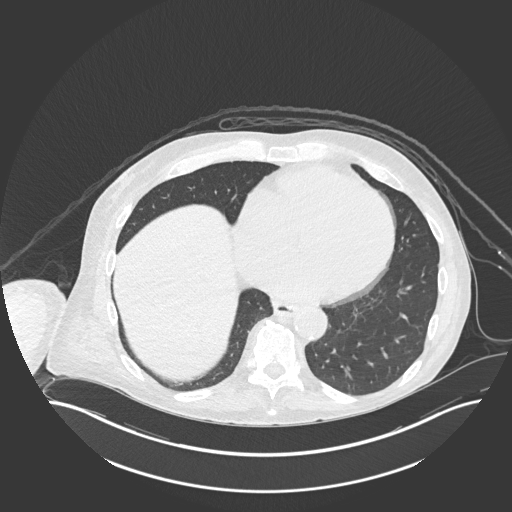
[im 69/186  mediastinal]
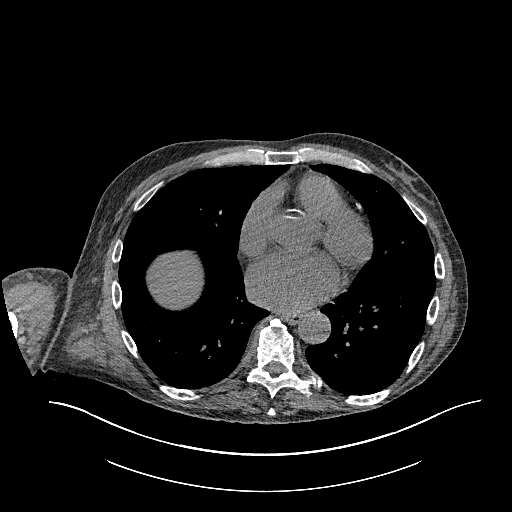
[im 69/186  lung]
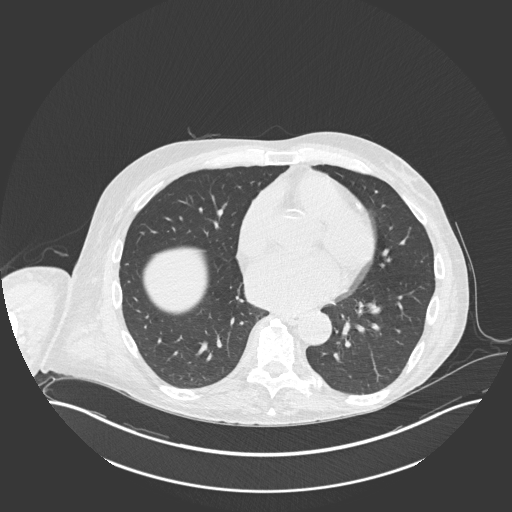
[im 83/186  lung]
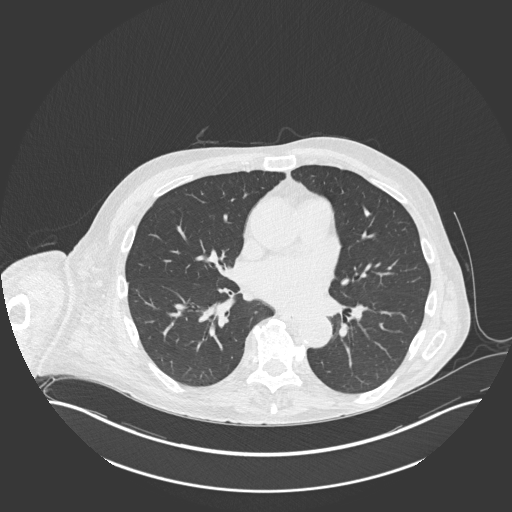
[im 103/186  lung]
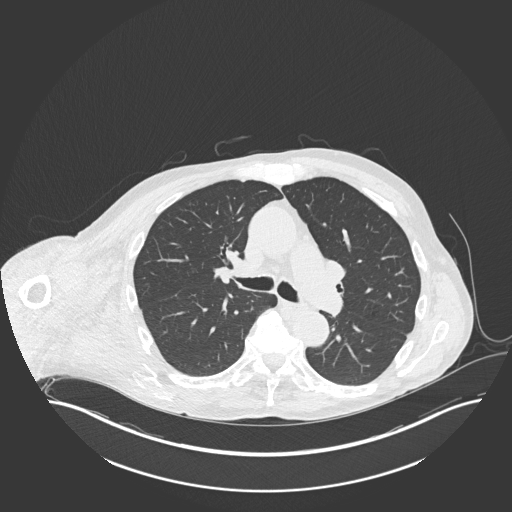
[im 117/186  lung]
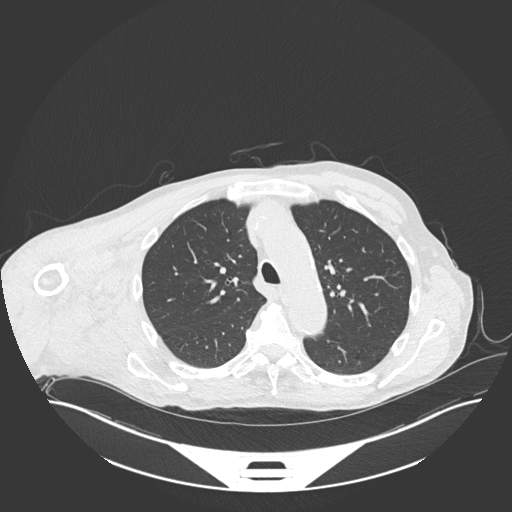
[im 131/186  mediastinal]
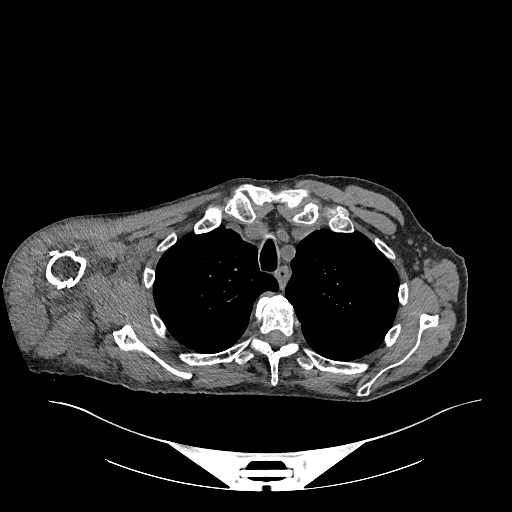
[im 131/186  lung]
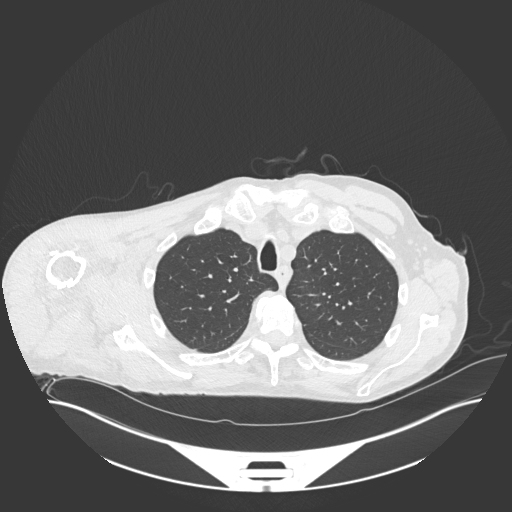
[im 144/186  lung]
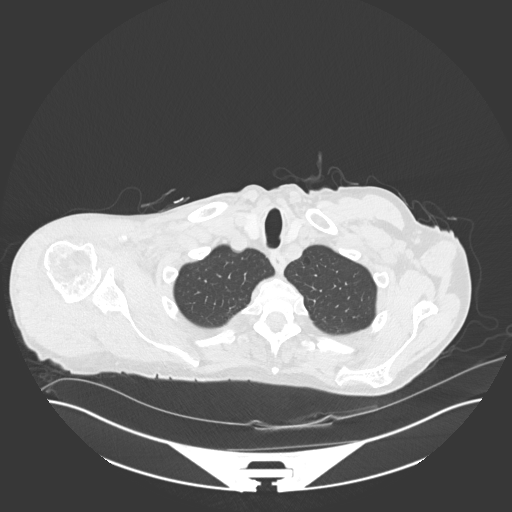
[im 158/186  lung]
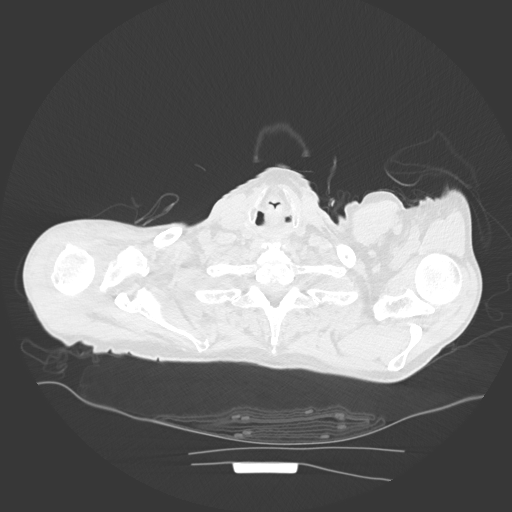
[im 172/186  lung]
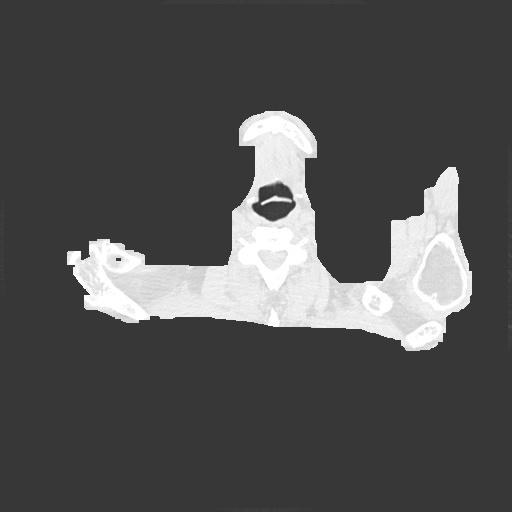

[Series 5: coronal · coronal · 0.73mm/px · 3 of 139 slices shown]
[im 28/139  lung]
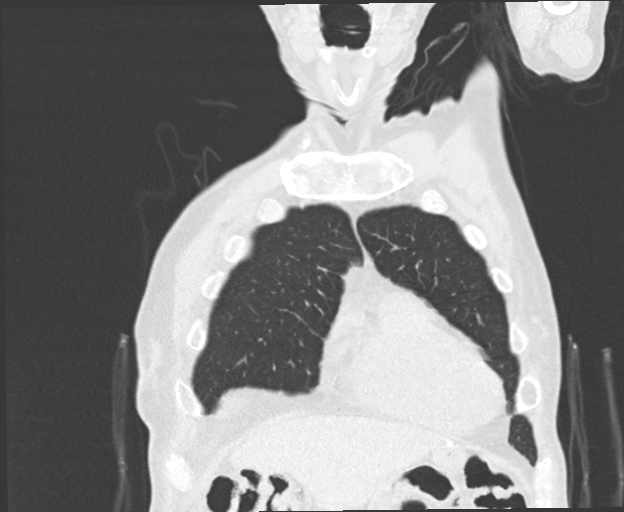
[im 56/139  lung]
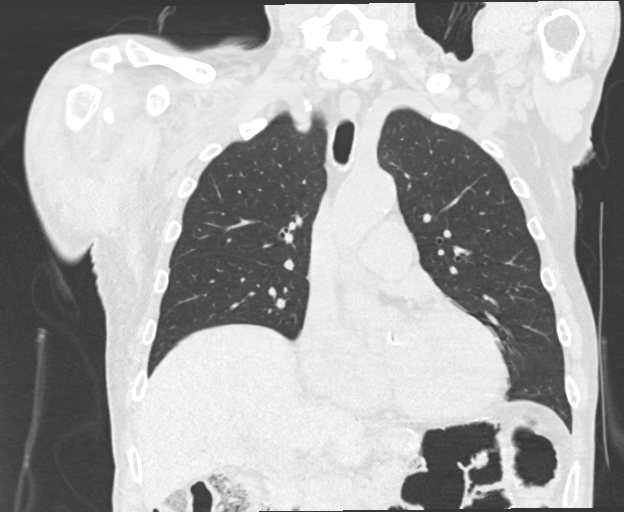
[im 83/139  lung]
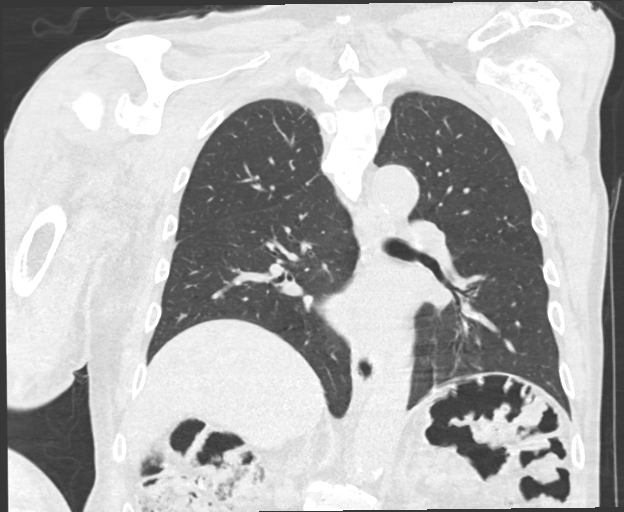

[15 of 36 positions shown; findings below may reference images not displayed]

FINDINGS: Cardiovascular: Aortic valve and coronary artery calcifications are
seen. The heart size is normal. There is no pericardial thickening
or effusion.

Mediastinum/Nodes: There are no enlarged mediastinal, hilar or
axillary lymph nodes. The thyroid gland, trachea and esophagus
demonstrate no significant findings.

Lungs/Pleura: There are no areas consolidation. No suspicious
pulmonary nodules. There is no pleural effusion.

Upper abdomen: The visualized portion of the upper abdomen is
unremarkable.

Musculoskeletal/Chest wall: There is a comminuted mildly impacted
fracture seen through the right proximal humerus involving the
anatomic and surgical necks as well as the greater tuberosity. The
humeral head still articulates with the glenoid. There is also
comminuted mildly displaced fracture seen through the inferior right
scapular wing and scapular body. A nondisplaced posterior sixth
right rib fracture is noted.
IMPRESSION: 1. Comminuted mildly impacted proximal right humerus neck and head
fracture. The humeral head still articulates with the glenoid.
2. Comminuted inferior scapular tip and body fracture.
3. Nondisplaced posterior right sixth rib fracture.

## 2019-02-21 IMAGING — CR DG SHOULDER 2+V*R*
1 series · 4 of 4 positions shown · non-contrast
Comparison: None.

CLINICAL DATA: Right shoulder pain for several days following fall,
initial encounter

EXAM:
RIGHT SHOULDER - 2+ VIEW

[Series 1: dg shoulder right · 0.14mm/px · 4 of 4 slices shown]
[im 1/4]
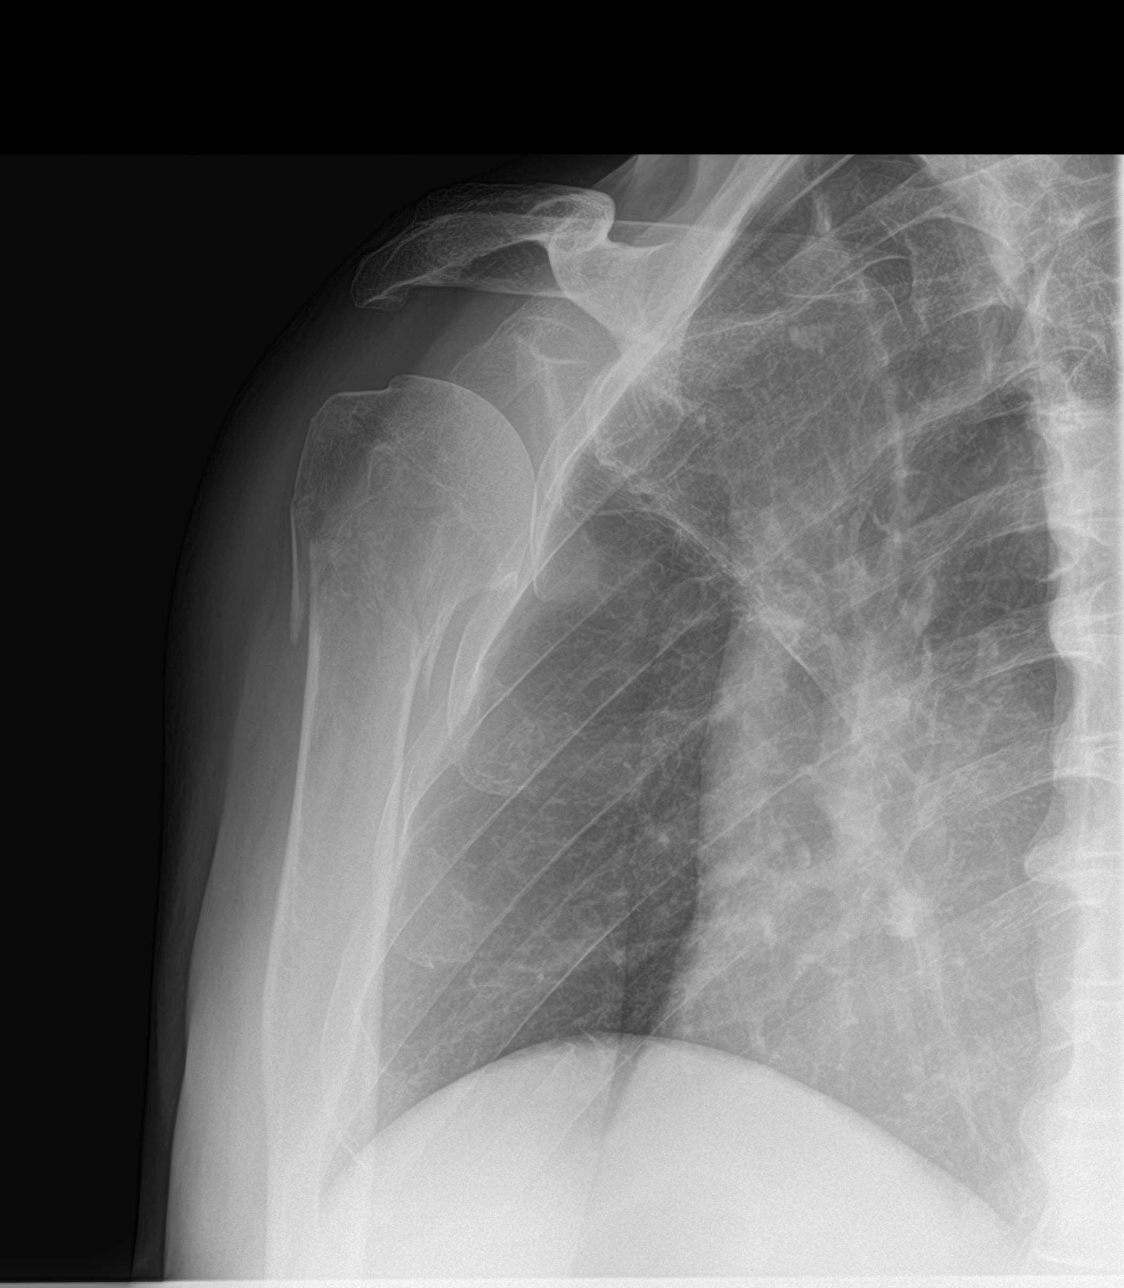
[im 2/4]
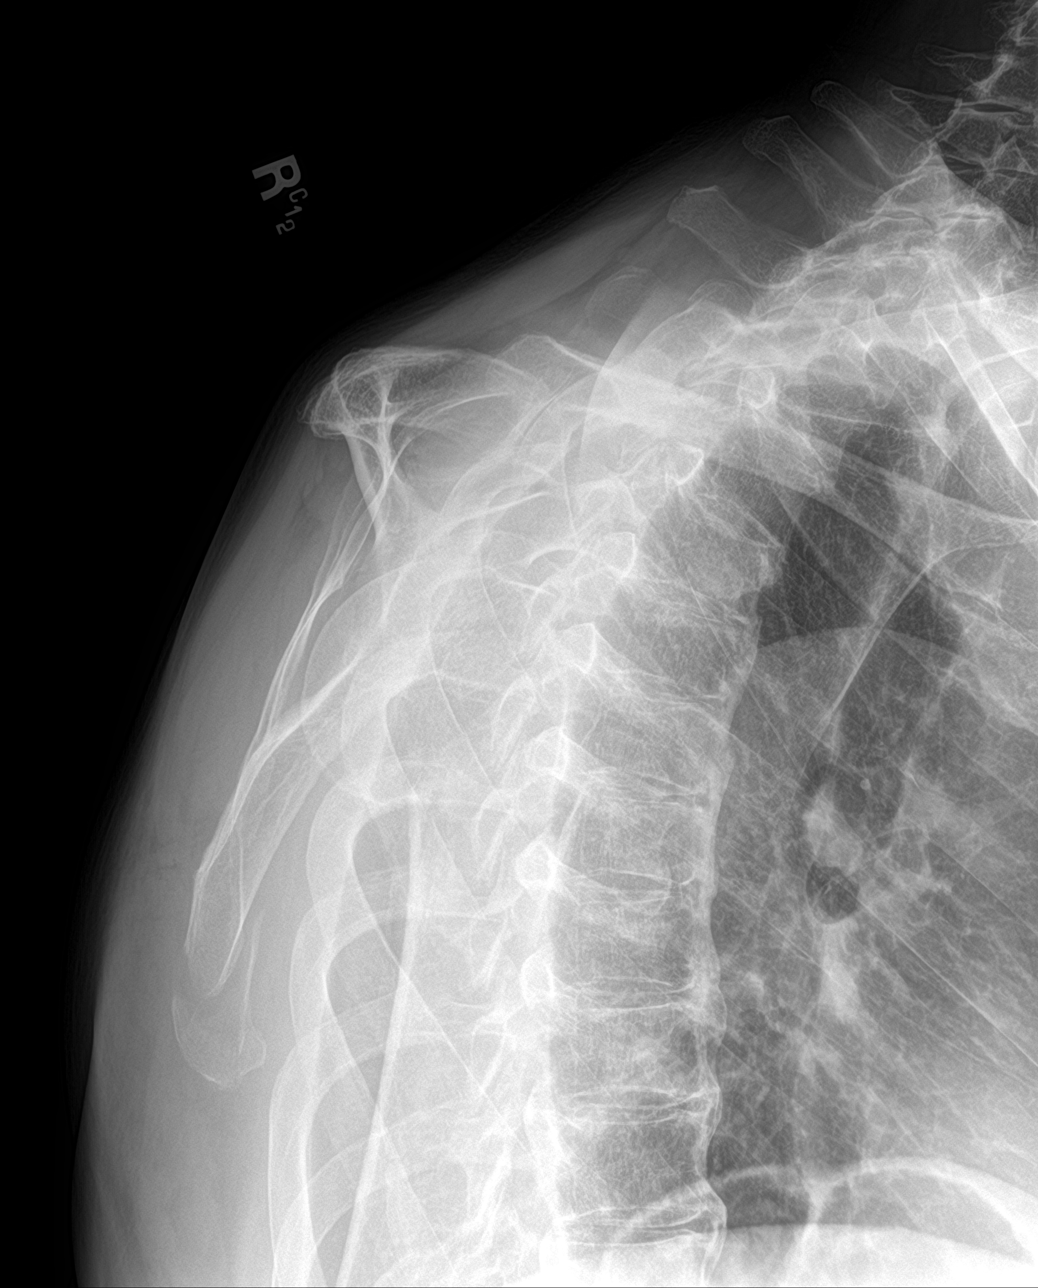
[im 3/4]
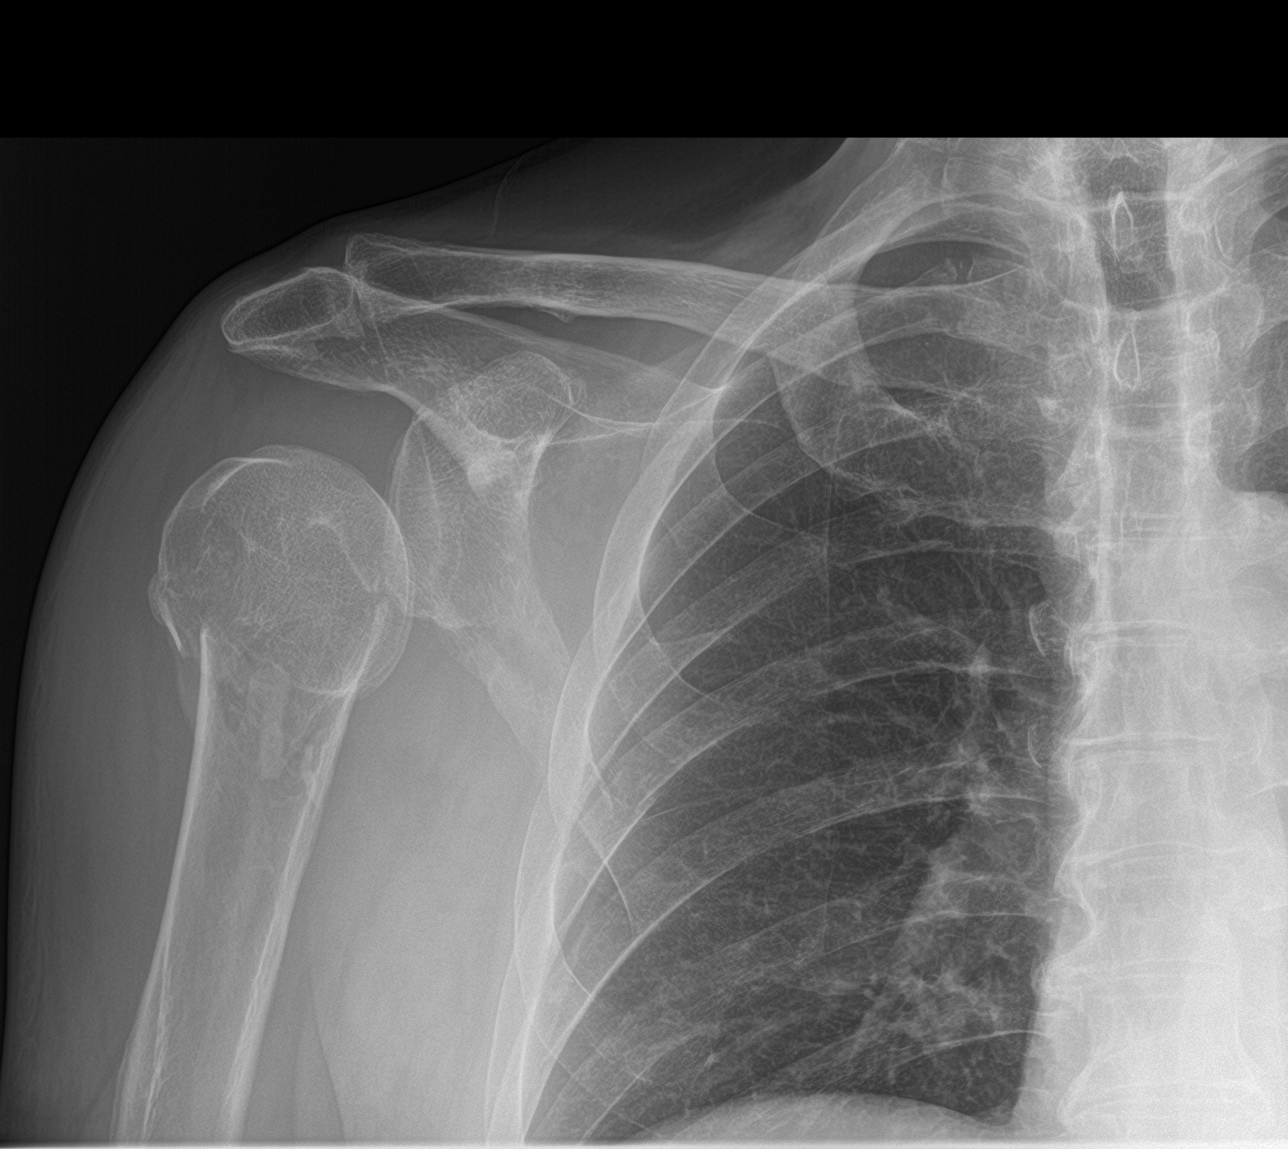
[im 4/4]
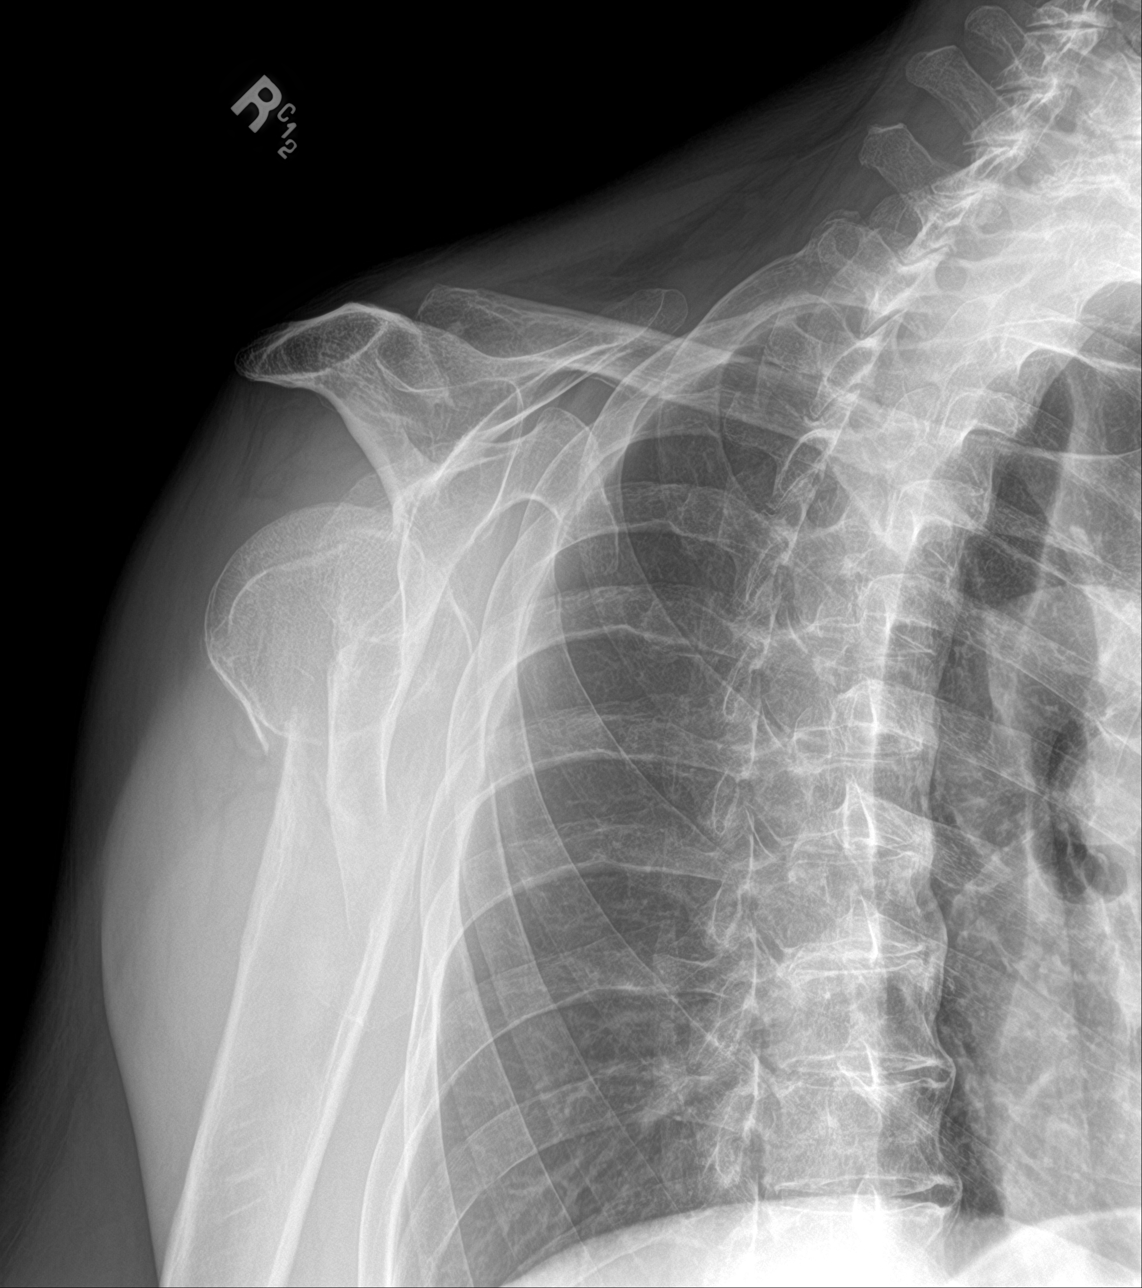

[4 of 4 positions shown; findings below may reference images not displayed]

FINDINGS: Degenerative changes of the acromioclavicular joint are noted.
Comminuted proximal right humeral fracture is noted involving
primarily the surgical neck with impaction at the fracture site.
Additionally a mildly displaced fracture at the tip of the scapula
is seen. The underlying bony thorax shows no focal abnormality.
IMPRESSION: Comminuted and impacted proximal humeral fracture with scapular tip
fracture.

## 2019-02-21 IMAGING — CT CT HEAD W/O CM
3 series · 15 of 47 positions shown, 18 images · non-contrast
Comparison: [XH]

CLINICAL DATA: Fall, memory changes

EXAM:
CT HEAD WITHOUT CONTRAST
TECHNIQUE: Contiguous axial images were obtained from the base of the skull
through the vertex without intravenous contrast.

[Series 2: head wo · axial · 0.46mm/px · z∈[-97,+33]mm · 9 of 32 slices shown, 12 images]
[im 3/32  brain]
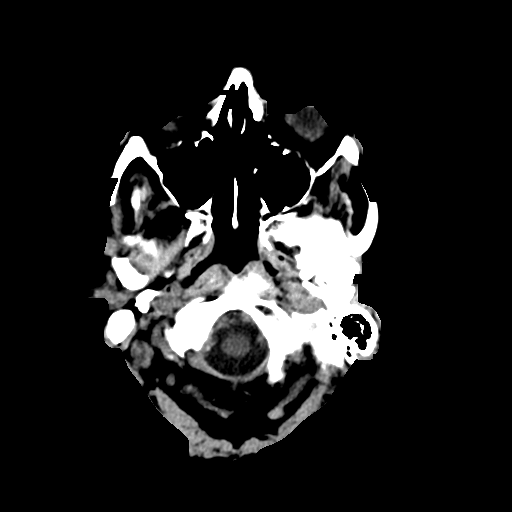
[im 3/32  bone]
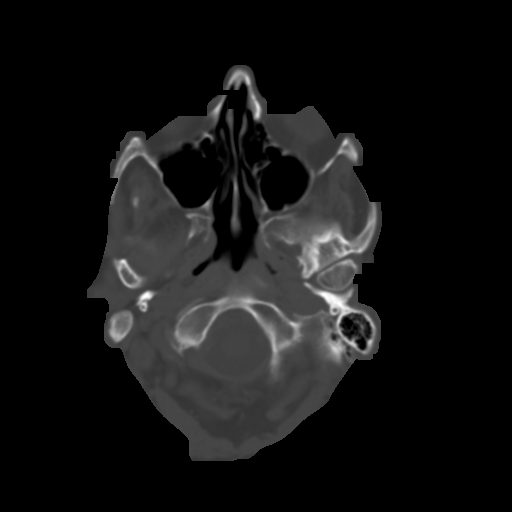
[im 6/32  brain]
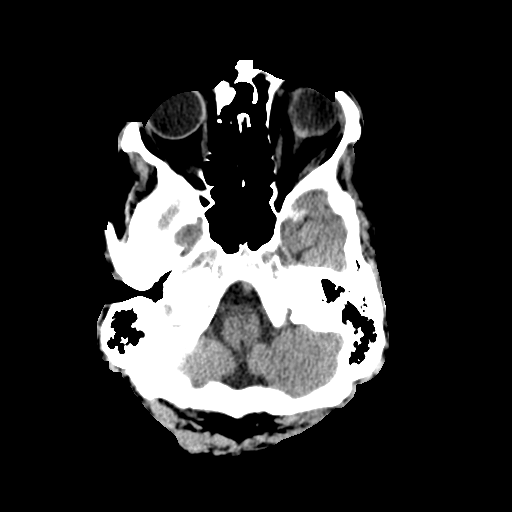
[im 9/32  brain]
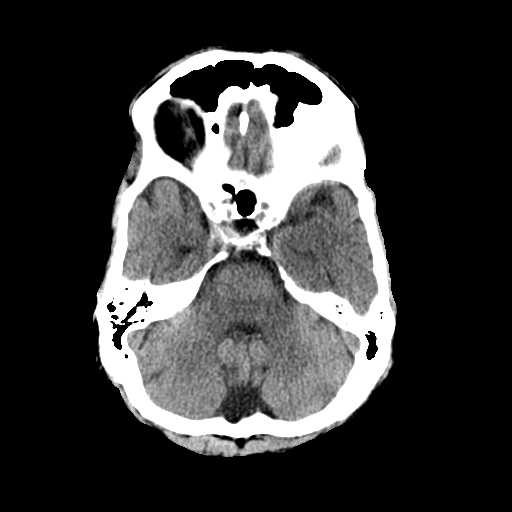
[im 12/32  brain]
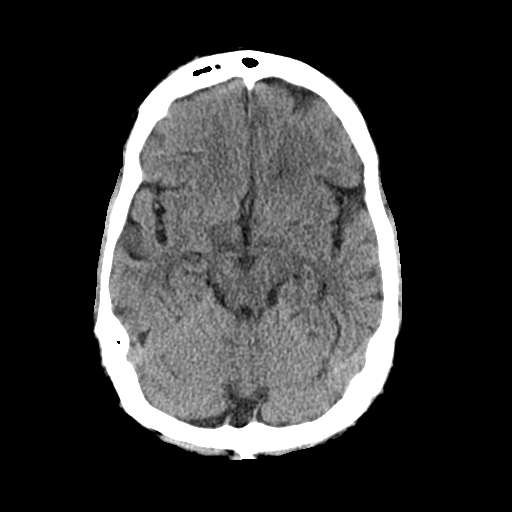
[im 17/32  brain]
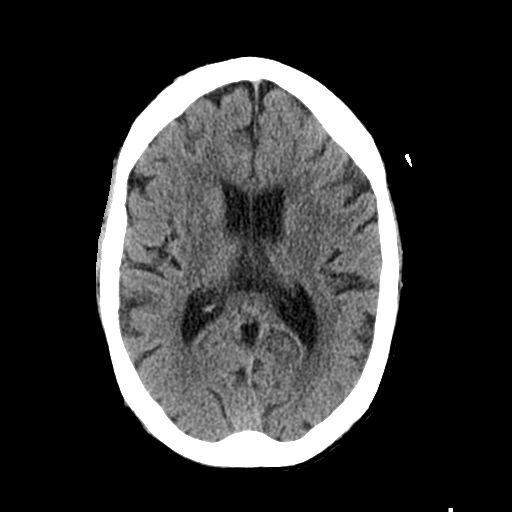
[im 17/32  bone]
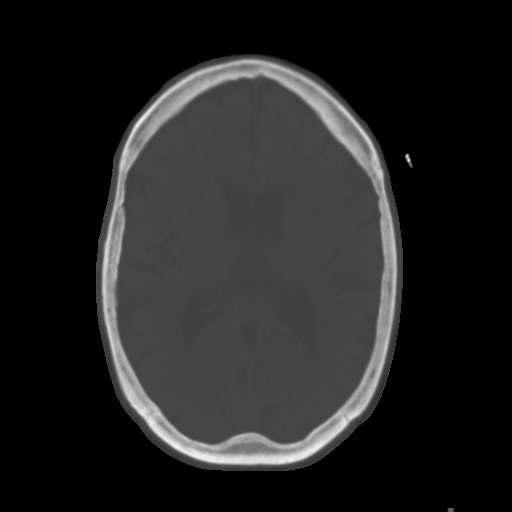
[im 20/32  brain]
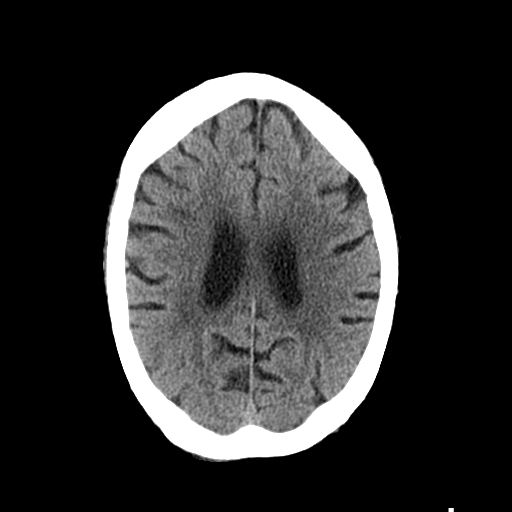
[im 23/32  brain]
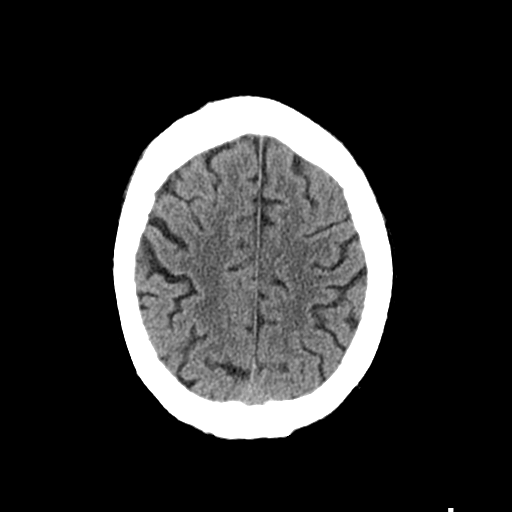
[im 26/32  brain]
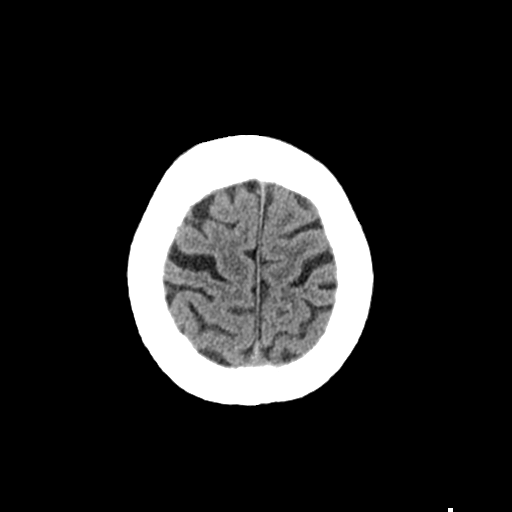
[im 29/32  brain]
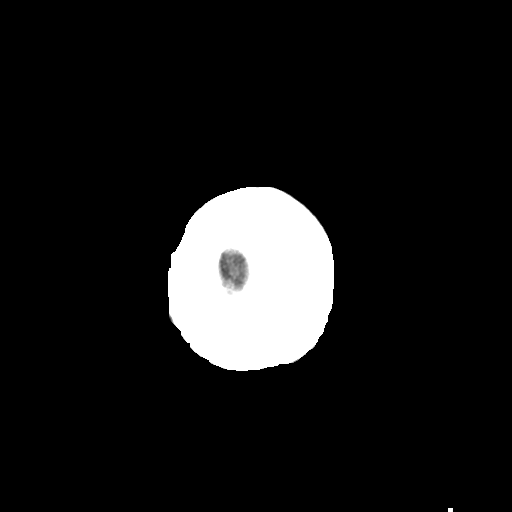
[im 29/32  bone]
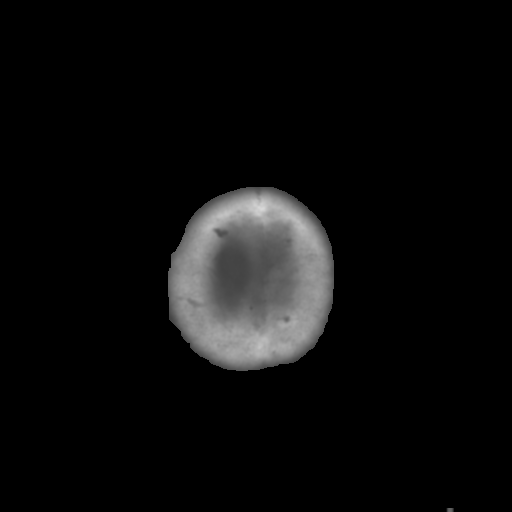

[Series 4: coronal soft tissue · coronal · 0.32mm/px · 3 of 66 slices shown]
[im 22/66  brain]
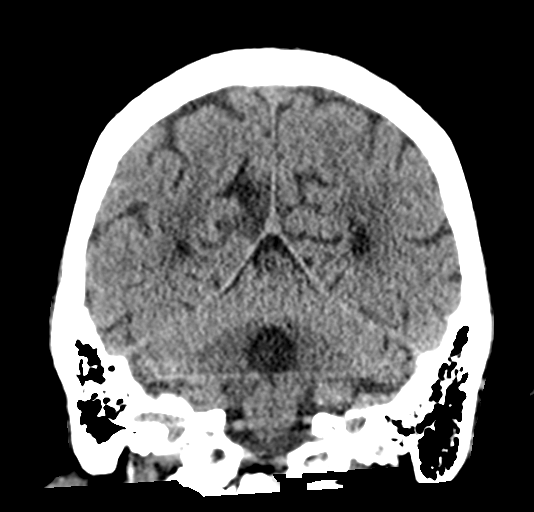
[im 29/66  brain]
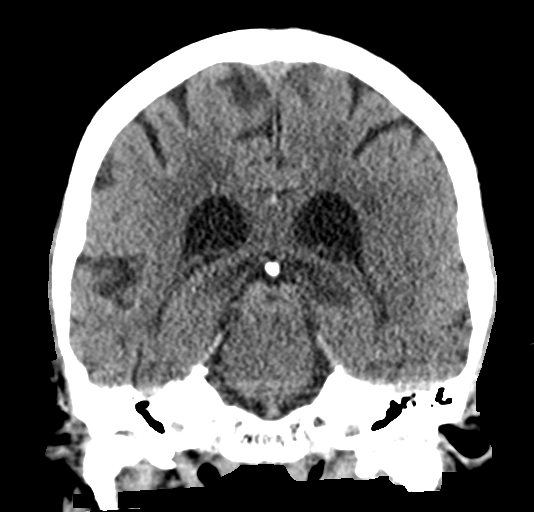
[im 37/66  brain]
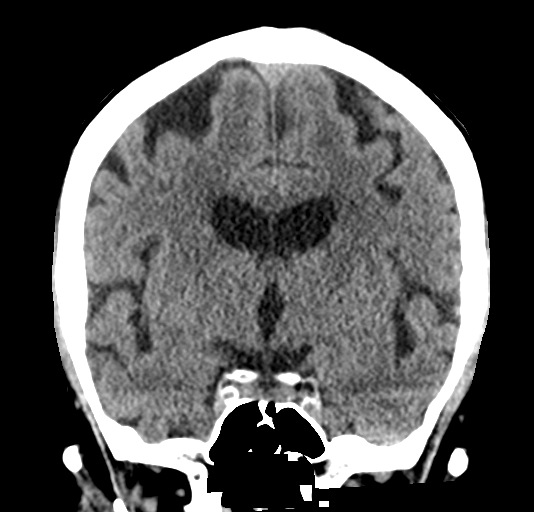

[Series 5: sagittal soft tissue · sagittal · 0.33mm/px · 3 of 52 slices shown]
[im 18/52  brain]
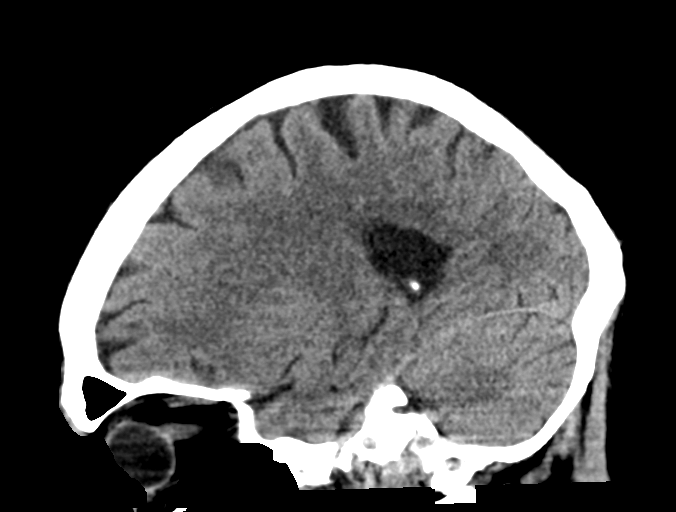
[im 26/52  brain]
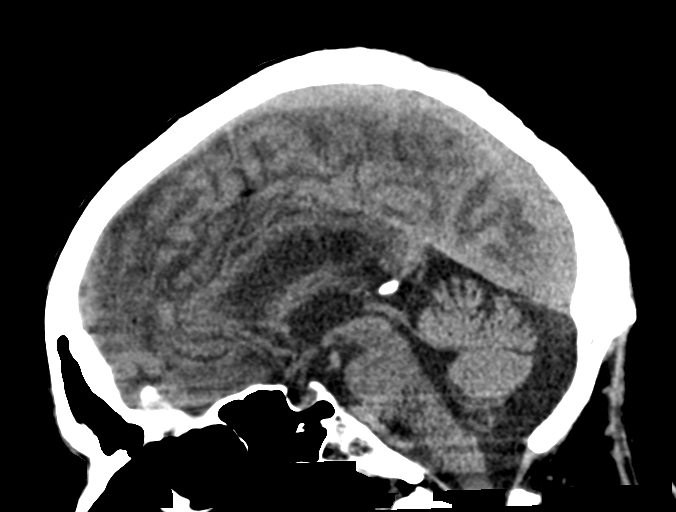
[im 35/52  brain]
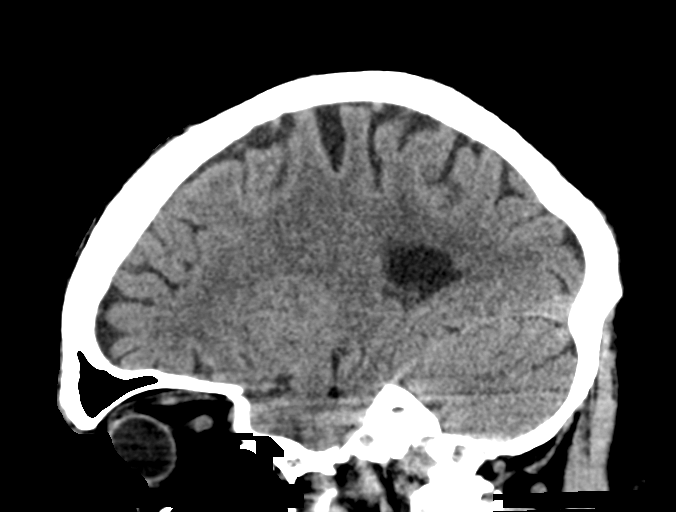

[15 of 47 positions shown; findings below may reference images not displayed]

FINDINGS: Brain: There is no acute intracranial hemorrhage, mass-effect, or
edema. Gray-white differentiation is preserved. There is no
extra-axial fluid collection. Ventricles and sulci are stable in
size. Patchy hypoattenuation in the supratentorial white matter is
nonspecific but probably reflects stable chronic microvascular
ischemic changes.

Vascular: There is mild atherosclerotic calcification at the skull
base.

Skull: Calvarium is unremarkable.

Sinuses/Orbits: Trace paranasal sinus mucosal thickening. Orbits
are.

Other: None.
IMPRESSION: No evidence of acute intracranial injury. Stable chronic findings
detailed above.

## 2019-02-21 IMAGING — CT CT CERVICAL SPINE W/O CM
3 of 4 series · 12 of 33 positions shown, 14 images · non-contrast
Comparison: No pertinent prior studies available for comparison.

CLINICAL DATA: Neck trauma, blunt. Additional provided: Fall on
[REDACTED].

EXAM:
CT CERVICAL SPINE WITHOUT CONTRAST
TECHNIQUE: Multidetector CT imaging of the cervical spine was performed without
intravenous contrast. Multiplanar CT image reconstructions were also
generated.

[Series 6: sagittal bone · sagittal · 0.35mm/px · 5 of 74 slices shown, 6 images]
[im 25/74  bone]
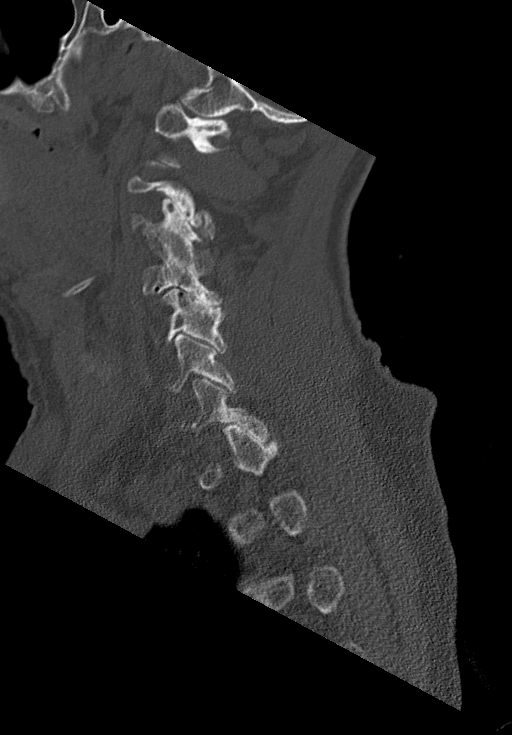
[im 31/74  bone]
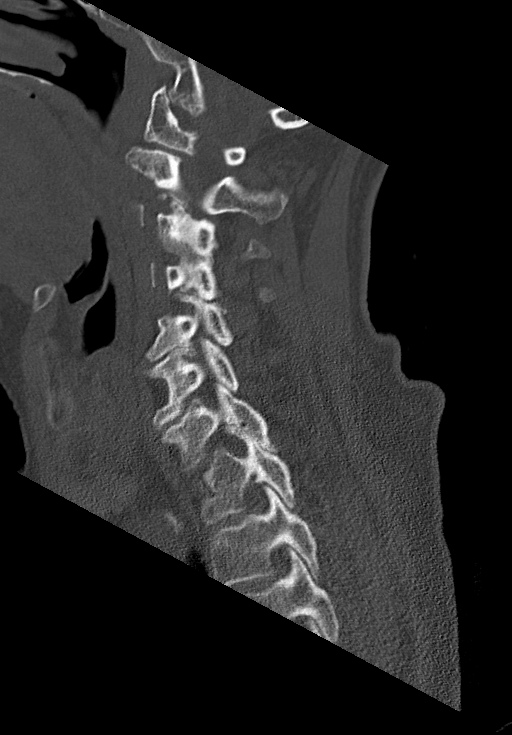
[im 37/74  soft-tissue]
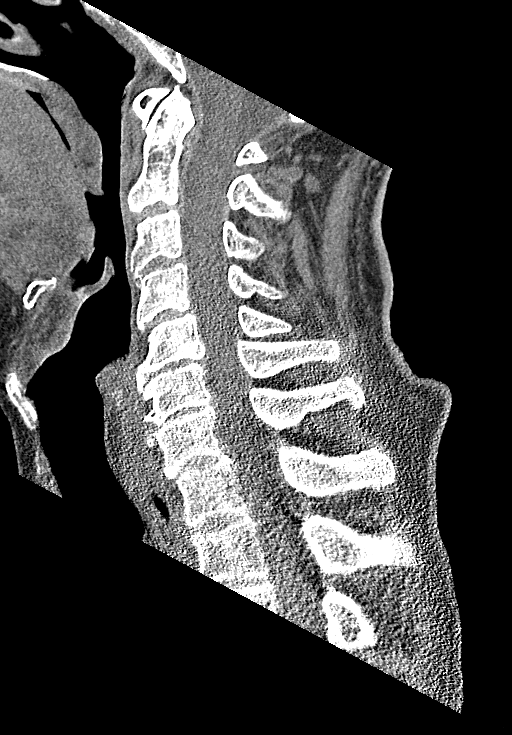
[im 37/74  bone]
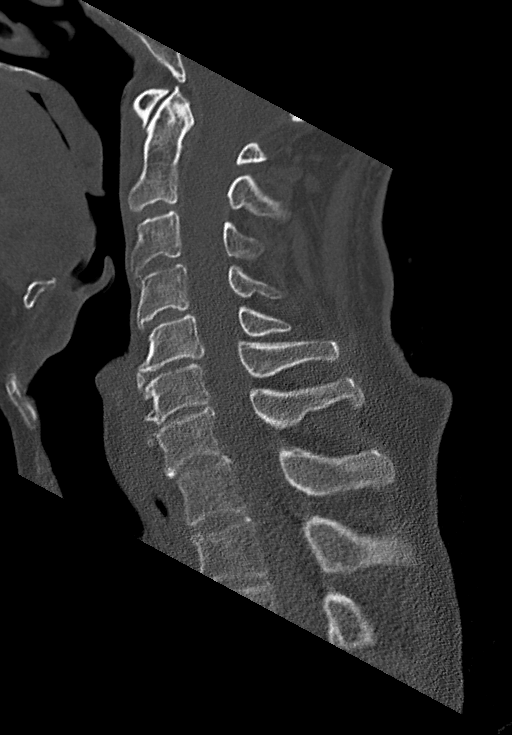
[im 43/74  bone]
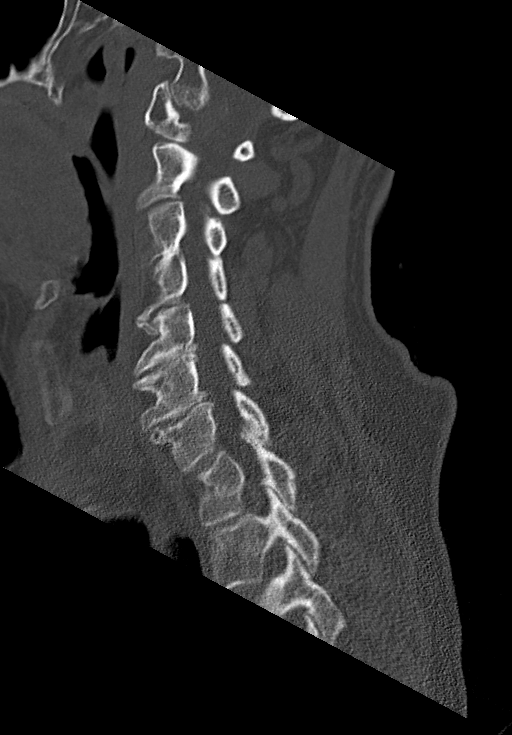
[im 49/74  bone]
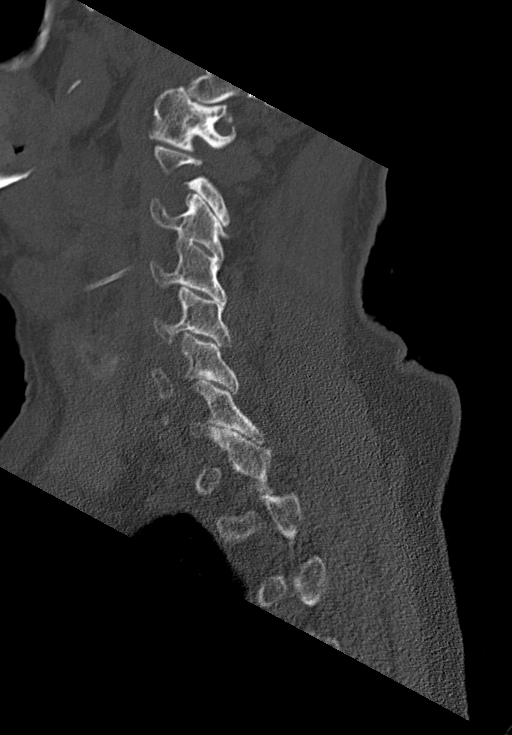

[Series 7: coronal bone · coronal · 0.30mm/px · 3 of 60 slices shown]
[im 17/60  bone]
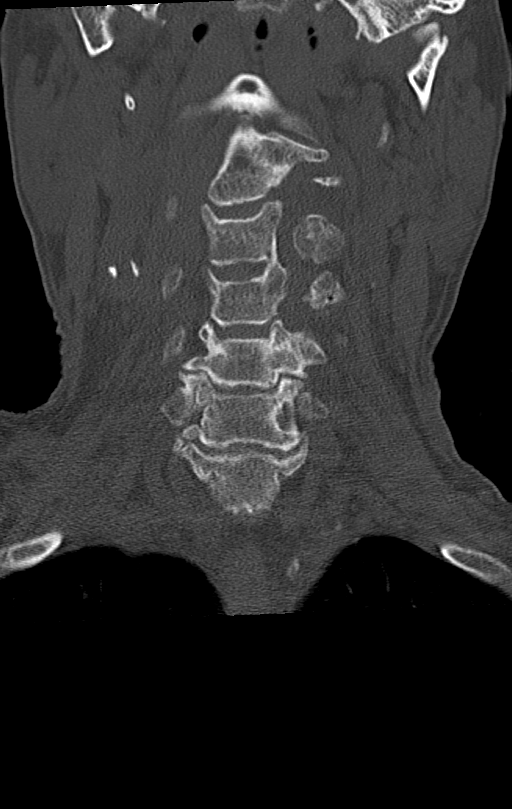
[im 26/60  bone]
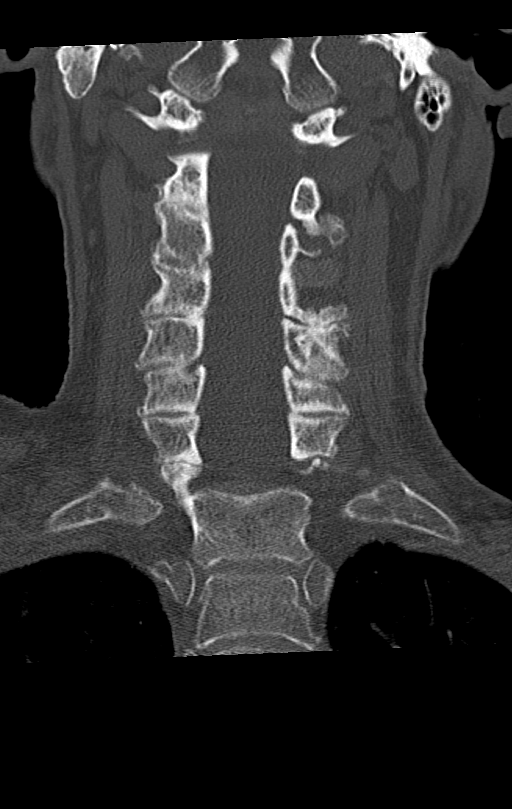
[im 35/60  bone]
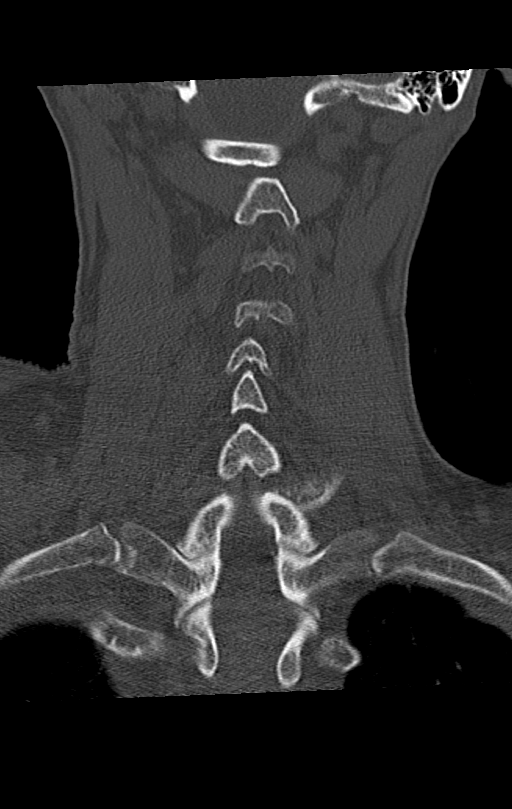

[Series 8: orthogonal bone · axial · 0.29mm/px · z∈[-280,-159]mm · 4 of 103 slices shown, 5 images]
[im 15/103  soft-tissue]
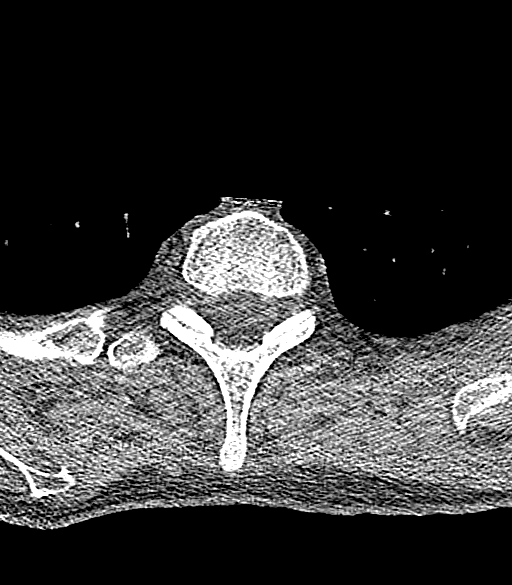
[im 15/103  bone]
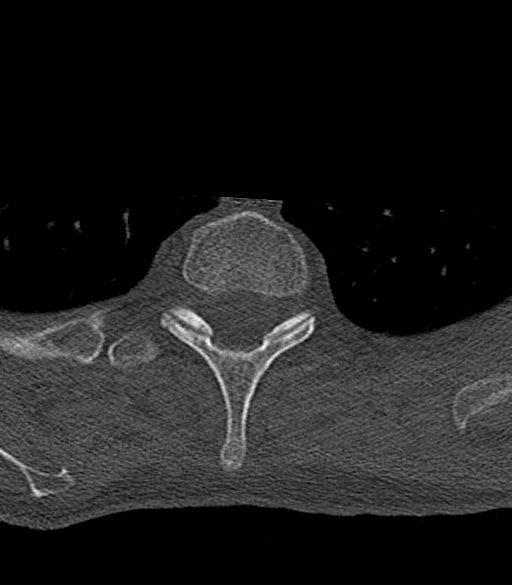
[im 44/103  bone]
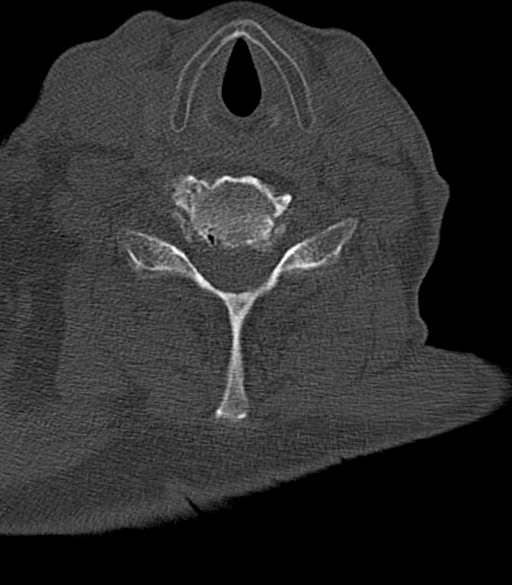
[im 59/103  bone]
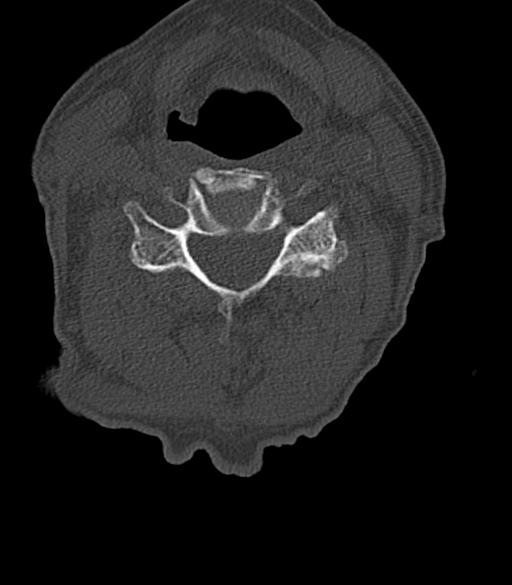
[im 88/103  bone]
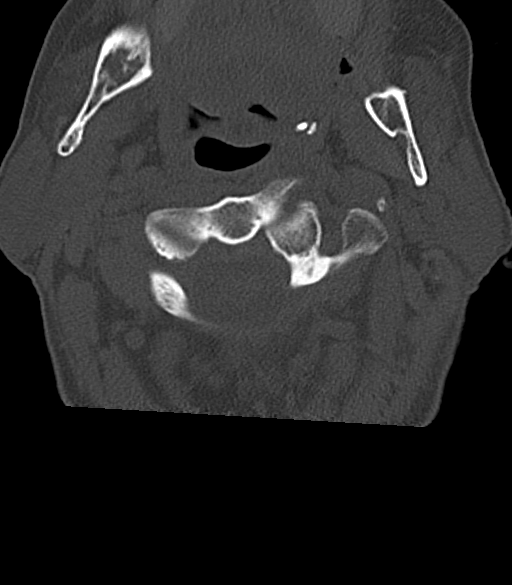

[12 of 33 positions shown; findings below may reference images not displayed]

FINDINGS: Alignment: Trace multilevel spondylolisthesis, likely degenerative.

Skull base and vertebrae: The basion-dental and atlanto-dental
intervals are maintained.No evidence of acute fracture to the
cervical spine.

Soft tissues and spinal canal: No prevertebral fluid or swelling. No
visible canal hematoma. Atherosclerotic calcification at the carotid
bifurcations and within the proximal cervical internal carotid
arteries.

Disc levels: Cervical spondylosis with multilevel disc degeneration,
posterior disc osteophytes, uncovertebral and facet hypertrophy.
Disc osteophyte complexes are most prominent C5-C6 and C6-C7.
Degenerative ankylosis of the C3-C4 articular pillars on the left.

Upper chest: No consolidation within the imaged lung apices. No
visible pneumothorax

Other: There is an acute appearing fracture of the posterior right
second rib (series 8, image 82). Probable additional nondisplaced
acute fracture of the posterior right fourth rib (series 2, image
77). Chronic fracture deformity versus congenital nonunion of the
right T1 transverse process. Additionally, a nonacute fracture of
the anterolateral right first rib is questioned (series 6, image 3)
IMPRESSION: 1. No evidence of acute fracture to the cervical spine.
2. Acute fracture of the posterior right second rib. Probable
additional acute nondisplaced fracture of the posterior right fourth
rib. Consider dedicated CT thorax to assess for additional thoracic
trauma.
3. Cervical spondylosis as described.

## 2019-02-21 NOTE — Discharge Instructions (Signed)
Please stop drinking immediately to avoid further injury.  You should continue all medications, but it is imperative that you follow up with cardiology in the next 1-2 weeks.  You should also see orthopedics for continued evaluation of your broken right arm. Wear the sling at all times to support the injury and minimize pain.  CT summary of injuries: IMPRESSION:  1. Comminuted mildly impacted proximal right humerus neck and head  fracture. The humeral head still articulates with the glenoid.  2. Comminuted inferior scapular tip and body fracture.  3. Nondisplaced posterior right sixth rib fracture.

## 2019-02-21 NOTE — ED Notes (Signed)
Signature pad unavailable at time of discharge. Hard copy printed and signed by pt.

## 2019-02-21 NOTE — ED Provider Notes (Signed)
[facility] Emergency Department Provider Note  ____________________________________________  Time seen: Approximately 5:53 PM  I have reviewed the triage vital signs and the nursing notes.   HISTORY  Chief Complaint Fall    HPI James Sanford is a 63 y.o. male with a history of hypertension diabetes sleep apnea is brought to the ED after a fall 2 days ago.  He has right arm pain since then, hurts to move it, nonradiating, no alleviating factors.  Moderate severity.  Constant.  Always in the context of alcohol use.  Patient has been drinking 3-4 times a week, often heavily.  A year ago he was admitted to the hospital after a fall fracturing his right wrist.  Found to have atrial fibrillation, started on Eliquis.  He has not followed up with cardiology since October 2019.  He denies frequent falls.  His wife is worried that he is becoming forgetful and has voiced some hopelessness to her.  However, patient denies SI HI or hallucinations.  He denies hopelessness or guilt.  He denies insomnia or loss of appetite.  He denies depressed mood.  He is future oriented, he is worried about the Covid pandemic and doing what he can to protect himself.  He is wearing a mask currently.  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  He states that he does not have any trouble with way finding, never gets lost while driving.  Is able to navigate road closures easily.  Manages his work schedule without getting mixed up and missing shifts or showing up when he is not scheduled.      Past Medical History:  Diagnosis Date  . Arthritis   . Diabetes mellitus without complication (HCC)   . Hyperlipemia   . Hypertension   . Sleep apnea      Patient Active Problem List   Diagnosis Date Noted  . Radius fracture 10/29/2017     Past Surgical History:  Procedure Laterality Date  . GASTRIC BYPASS    . HERNIA REPAIR    . JOINT REPLACEMENT  Bilateral    Knee surgery  . OPEN REDUCTION INTERNAL FIXATION (ORIF) DISTAL RADIAL FRACTURE Right 10/30/2017   Procedure: OPEN REDUCTION INTERNAL FIXATION (ORIF) DISTAL RADIAL FRACTURE;  Surgeon: Garnette Gunner, MD;  Location: [facility] ORS;  Service: Orthopedics;  Laterality: Right;     Prior to Admission medications   Medication Sig Start Date End Date Taking? Authorizing Provider  aspirin EC 325 MG EC tablet Take 1 tablet (325 mg total) by mouth daily with breakfast. 10/31/17   Dedra Skeens, PA-C  hydrALAZINE (APRESOLINE) 25 MG tablet Take 1 tablet (25 mg total) by mouth 2 (two) times daily. 10/31/17 11/30/17  Ihor Austin, MD  metoprolol tartrate (LOPRESSOR) 25 MG tablet Take 1 tablet (25 mg total) by mouth 2 (two) times daily. 10/31/17 11/30/17  Ihor Austin, MD  oxyCODONE (OXY IR/ROXICODONE) 5 MG immediate release tablet Take 1 tablet (5 mg total) by mouth every 4 (four) hours as needed for severe pain (pain score 7-10). 10/31/17   Dedra Skeens, PA-C     Allergies Patient has no known allergies.   Family History  Problem Relation Age of Onset  . Stroke Mother   . Suicidality Father     Social History Social History   Tobacco Use  . Smoking status: Never Smoker  . Smokeless tobacco: Former Neurosurgeon    Types: Chew  . Tobacco comment: Luellen Pucker about 4 years ago  Substance Use Topics  . Alcohol use: Yes    Comment: beer occasionally  . Drug use: Not Currently    Review of Systems  Constitutional:   No fever or chills.  ENT:   No sore throat. No rhinorrhea. Cardiovascular:   No chest pain or syncope. Respiratory:   No dyspnea or cough. Gastrointestinal:   Negative for abdominal pain, vomiting and diarrhea.  Musculoskeletal:   Right arm pain as above All other systems reviewed and are negative except as documented above in ROS and HPI.  ____________________________________________   PHYSICAL EXAM:  VITAL SIGNS: ED Triage Vitals [02/21/19 1511]  Enc Vitals Group     BP 121/77      Pulse Rate (!) 108     Resp 17     Temp 99 F (37.2 C)     Temp Source Oral     SpO2 100 %     Weight 180 lb (81.6 kg)     Height 5\' 11"  (1.803 m)     Head Circumference      Peak Flow      Pain Score 8     Pain Loc      Pain Edu?      Excl. in GC?     Vital signs reviewed, nursing assessments reviewed.   Constitutional:   Alert and oriented. Non-toxic appearance. Eyes:   Conjunctivae are normal. EOMI. PERRL. ENT      Head:   Normocephalic and atraumatic.      Nose:   Wearing a mask.      Mouth/Throat:   Wearing a mask.      Neck:   No meningismus. Full ROM. Hematological/Lymphatic/Immunilogical:   No cervical lymphadenopathy. Cardiovascular:   Irregularly irregular rhythm, rate controlled, heart rate 90-1 10. Symmetric bilateral radial and DP pulses.  No murmurs. Cap refill less than 2 seconds. Respiratory:   Normal respiratory effort without tachypnea/retractions. Breath sounds are clear and equal bilaterally. No wheezes/rales/rhonchi. Gastrointestinal:   Soft and nontender. Non distended. There is no CVA tenderness.  No rebound, rigidity, or guarding.  Musculoskeletal: Right proximal humerus tenderness and inferior scapular tenderness.  Chest wall stable and nontender.  No crepitus.  No bruising.   Neurologic:   Normal speech and language.  Motor grossly intact. No acute focal neurologic deficits are appreciated.  Skin:    Skin is warm, dry and intact. No rash noted.  No petechiae, purpura, or bullae.  ____________________________________________    LABS (pertinent positives/negatives) (all labs ordered are listed, but only abnormal results are displayed) Labs Reviewed - No data to display ____________________________________________   EKG    ____________________________________________    RADIOLOGY  Dg Shoulder Right  Result Date: 02/21/2019 CLINICAL DATA:  Right shoulder pain for several days following fall, initial encounter EXAM: RIGHT SHOULDER -  2+ VIEW COMPARISON:  None. FINDINGS: Degenerative changes of the acromioclavicular joint are noted. Comminuted proximal right humeral fracture is noted involving primarily the surgical neck with impaction at the fracture site. Additionally a mildly displaced fracture at the tip of the scapula is seen. The underlying bony thorax shows no focal abnormality. IMPRESSION: Comminuted and impacted proximal humeral fracture with scapular tip fracture. Electronically Signed   By: Alcide CleverMark  Lukens M.D.   On: 02/21/2019 16:07   Ct Head Wo Contrast  Result Date: 02/21/2019 CLINICAL DATA:  Fall, memory changes EXAM: CT HEAD WITHOUT CONTRAST TECHNIQUE: Contiguous axial images were obtained from the base of the skull through the vertex without intravenous contrast.  COMPARISON:  2019 FINDINGS: Brain: There is no acute intracranial hemorrhage, mass-effect, or edema. Gray-white differentiation is preserved. There is no extra-axial fluid collection. Ventricles and sulci are stable in size. Patchy hypoattenuation in the supratentorial white matter is nonspecific but probably reflects stable chronic microvascular ischemic changes. Vascular: There is mild atherosclerotic calcification at the skull base. Skull: Calvarium is unremarkable. Sinuses/Orbits: Trace paranasal sinus mucosal thickening. Orbits are. Other: None. IMPRESSION: No evidence of acute intracranial injury. Stable chronic findings detailed above. Electronically Signed   By: Macy Mis M.D.   On: 02/21/2019 15:52   Ct Chest Wo Contrast  Result Date: 02/21/2019 CLINICAL DATA:  Abdominal trauma, fall Monday EXAM: CT CHEST WITHOUT CONTRAST TECHNIQUE: Multidetector CT imaging of the chest was performed following the standard protocol without IV contrast. COMPARISON:  None. FINDINGS: Cardiovascular: Aortic valve and coronary artery calcifications are seen. The heart size is normal. There is no pericardial thickening or effusion. Mediastinum/Nodes: There are no enlarged  mediastinal, hilar or axillary lymph nodes. The thyroid gland, trachea and esophagus demonstrate no significant findings. Lungs/Pleura: There are no areas consolidation. No suspicious pulmonary nodules. There is no pleural effusion. Upper abdomen: The visualized portion of the upper abdomen is unremarkable. Musculoskeletal/Chest wall: There is a comminuted mildly impacted fracture seen through the right proximal humerus involving the anatomic and surgical necks as well as the greater tuberosity. The humeral head still articulates with the glenoid. There is also comminuted mildly displaced fracture seen through the inferior right scapular wing and scapular body. A nondisplaced posterior sixth right rib fracture is noted. IMPRESSION: 1. Comminuted mildly impacted proximal right humerus neck and head fracture. The humeral head still articulates with the glenoid. 2. Comminuted inferior scapular tip and body fracture. 3. Nondisplaced posterior right sixth rib fracture. Electronically Signed   By: Prudencio Pair M.D.   On: 02/21/2019 16:58   Ct Cervical Spine Wo Contrast  Result Date: 02/21/2019 CLINICAL DATA:  Neck trauma, blunt. Additional provided: Fall on Monday. EXAM: CT CERVICAL SPINE WITHOUT CONTRAST TECHNIQUE: Multidetector CT imaging of the cervical spine was performed without intravenous contrast. Multiplanar CT image reconstructions were also generated. COMPARISON:  No pertinent prior studies available for comparison. FINDINGS: Alignment: Trace multilevel spondylolisthesis, likely degenerative. Skull base and vertebrae: The basion-dental and atlanto-dental intervals are maintained.No evidence of acute fracture to the cervical spine. Soft tissues and spinal canal: No prevertebral fluid or swelling. No visible canal hematoma. Atherosclerotic calcification at the carotid bifurcations and within the proximal cervical internal carotid arteries. Disc levels: Cervical spondylosis with multilevel disc degeneration,  posterior disc osteophytes, uncovertebral and facet hypertrophy. Disc osteophyte complexes are most prominent C5-C6 and C6-C7. Degenerative ankylosis of the C3-C4 articular pillars on the left. Upper chest: No consolidation within the imaged lung apices. No visible pneumothorax Other: There is an acute appearing fracture of the posterior right second rib (series 8, image 82). Probable additional nondisplaced acute fracture of the posterior right fourth rib (series 2, image 77). Chronic fracture deformity versus congenital nonunion of the right T1 transverse process. Additionally, a nonacute fracture of the anterolateral right first rib is questioned (series 6, image 3) IMPRESSION: 1. No evidence of acute fracture to the cervical spine. 2. Acute fracture of the posterior right second rib. Probable additional acute nondisplaced fracture of the posterior right fourth rib. Consider dedicated CT thorax to assess for additional thoracic trauma. 3. Cervical spondylosis as described. Electronically Signed   By: Kellie Simmering DO   On: 02/21/2019 16:05  ____________________________________________   PROCEDURES Procedures  ____________________________________________  DIFFERENTIAL DIAGNOSIS   Intracranial hemorrhage, C-spine fracture, rib fracture, humerus fracture, hemothorax  CLINICAL IMPRESSION / ASSESSMENT AND PLAN / ED COURSE  Medications ordered in the ED: Medications - No data to display  Pertinent labs & imaging results that were available during my care of the patient were reviewed by me and considered in my medical decision making (see chart for details).  James Sanford was evaluated in Emergency Department on 02/21/2019 for the symptoms described in the history of present illness. He was evaluated in the context of the global COVID-19 pandemic, which necessitated consideration that the patient might be at risk for infection with the SARS-CoV-2 virus that causes COVID-19.  Institutional protocols and algorithms that pertain to the evaluation of patients at risk for COVID-19 are in a state of rapid change based on information released by regulatory bodies including the CDC and federal and state organizations. These policies and algorithms were followed during the patient's care in the ED.   Patient presents after fall related to alcohol abuse.  CT imaging needed to evaluate for serious traumatic injuries.  Patient is currently clinically sober and lucid, no significant depression symptoms.  Offered psychiatry evaluation which patient refuses.  Clinical Course as of Feb 20 1753  Wed Feb 21, 2019  1646 CT head neck and chest images all viewed by me, no acute fractures or intracranial hemorrhage.  Right shoulder x-ray does show a proximal humerus fracture as well as inferior scapular fracture.  Radiology reports reviewed as well confirming these findings. Shoulder xray findings discussed with orthopedics Dr. Allena Katz who agrees with sling placement and outpatient follow-up.   [PS]    Clinical Course User Index [PS] Sharman Cheek, MD     ----------------------------------------- 5:58 PM on 02/21/2019 -----------------------------------------  Case discussed with Dr. Darrold Junker regarding Eliquis, he states that current evidence supports the continued use of Eliquis given that patient is not having more than 30 falls in a year best we can tell.  He is concerned about the patient's lack of follow-up and says Eliquis can be continued if the patient agrees to follow-up closely.  Patient does indicate that he intends to stop drinking immediately because it is "not worth it" and that he will follow-up with Dr. Darrold Junker after the Thanksgiving holiday next week.  He will also follow-up with orthopedics regarding his humerus fracture.  He will continue taking his medications.  He can call his insurance substance abuse treatment hotline if  needed.  ____________________________________________   FINAL CLINICAL IMPRESSION(S) / ED DIAGNOSES    Final diagnoses:  Fall, initial encounter  Alcohol abuse  Traumatic closed fx of proximal humerus with minimal displacement, right, initial encounter  Closed fracture of blade of right scapula, initial encounter  Traumatic closed fracture of one rib of right side with minimal displacement, initial encounter     ED Discharge Orders    None      Portions of this note were generated with dragon dictation software. Dictation errors may occur despite best attempts at proofreading.   Sharman Cheek, MD 02/21/19 1759

## 2019-02-21 NOTE — ED Notes (Signed)
Pt and Pt's wife verbalized understanding of discharge instructions. Pt NAD at this time.

## 2019-02-21 NOTE — ED Triage Notes (Signed)
Pt comes from [facility] after a fall Monday. Pt had been drinking. Pt states that he drinks 3-4 times a week. Pt has not had any other falls recently. Pt on blood thinners. Wife very concerned about depression and some memory changes. Pt denies any SI or depression.

## 2019-03-07 DIAGNOSIS — F1010 Alcohol abuse, uncomplicated: Secondary | ICD-10-CM | POA: Diagnosis present

## 2019-03-07 DIAGNOSIS — I428 Other cardiomyopathies: Secondary | ICD-10-CM

## 2019-03-07 DIAGNOSIS — I429 Cardiomyopathy, unspecified: Secondary | ICD-10-CM

## 2019-07-05 ENCOUNTER — Other Ambulatory Visit: Payer: Self-pay

## 2019-07-05 ENCOUNTER — Ambulatory Visit: Payer: BC Managed Care – PPO | Attending: Internal Medicine

## 2019-07-05 DIAGNOSIS — Z23 Encounter for immunization: Secondary | ICD-10-CM

## 2019-07-05 NOTE — Progress Notes (Signed)
   Covid-19 Vaccination Clinic  Name:  James Sanford    MRN: [id number] DOB: [birth year]  07/05/2019  [name] was observed post Covid-19 immunization for 15 minutes without incident. He was provided with Vaccine Information Sheet and instruction to access the V-Safe system.   [name] was instructed to call 911 with any severe reactions post vaccine: Marland Kitchen Difficulty breathing  . Swelling of face and throat  . A fast heartbeat  . A bad rash all over body  . Dizziness and weakness   Immunizations Administered    Name Date Dose VIS Date Route   Pfizer COVID-19 Vaccine 07/05/2019 11:23 AM 0.3 mL 03/16/2019 Intramuscular   Manufacturer: [employer], [employer]   Lot: [id number]   NDC: [address]-9

## 2019-08-01 ENCOUNTER — Ambulatory Visit: Payer: BC Managed Care – PPO | Attending: Internal Medicine

## 2019-08-01 DIAGNOSIS — Z23 Encounter for immunization: Secondary | ICD-10-CM

## 2019-08-01 NOTE — Progress Notes (Signed)
   Covid-19 Vaccination Clinic  Name:  James Sanford    MRN: [id number] DOB: [birth year]  08/01/2019  James Sanford was observed post Covid-19 immunization for 15 minutes without incident. He was provided with Vaccine Information Sheet and instruction to access the V-Safe system.   James Sanford was instructed to call 911 with any severe reactions post vaccine: Marland Kitchen Difficulty breathing  . Swelling of face and throat  . A fast heartbeat  . A bad rash all over body  . Dizziness and weakness   Immunizations Administered    Name Date Dose VIS Date Route   Pfizer COVID-19 Vaccine 08/01/2019 12:51 PM 0.3 mL 05/30/2018 Intramuscular   Manufacturer: [employer], [employer]   Lot: [id number]   NDC: [address]-5

## 2020-01-25 ENCOUNTER — Encounter: Payer: Self-pay | Admitting: Radiology

## 2020-01-25 ENCOUNTER — Inpatient Hospital Stay
Admission: EM | Admit: 2020-01-25 | Discharge: 2020-02-06 | DRG: 982 | Disposition: A | Discharge status: Skilled Nursing Facility | Payer: BC Managed Care – PPO | Attending: Internal Medicine | Admitting: Internal Medicine

## 2020-01-25 ENCOUNTER — Emergency Department: Payer: BC Managed Care – PPO

## 2020-01-25 DIAGNOSIS — G4733 Obstructive sleep apnea (adult) (pediatric): Secondary | ICD-10-CM | POA: Diagnosis present

## 2020-01-25 DIAGNOSIS — D72829 Elevated white blood cell count, unspecified: Secondary | ICD-10-CM | POA: Diagnosis not present

## 2020-01-25 DIAGNOSIS — F1010 Alcohol abuse, uncomplicated: Secondary | ICD-10-CM | POA: Diagnosis not present

## 2020-01-25 DIAGNOSIS — Z20822 Contact with and (suspected) exposure to covid-19: Secondary | ICD-10-CM | POA: Diagnosis present

## 2020-01-25 DIAGNOSIS — M1990 Unspecified osteoarthritis, unspecified site: Secondary | ICD-10-CM | POA: Diagnosis present

## 2020-01-25 DIAGNOSIS — R52 Pain, unspecified: Secondary | ICD-10-CM

## 2020-01-25 DIAGNOSIS — J439 Emphysema, unspecified: Secondary | ICD-10-CM | POA: Diagnosis present

## 2020-01-25 DIAGNOSIS — I429 Cardiomyopathy, unspecified: Secondary | ICD-10-CM

## 2020-01-25 DIAGNOSIS — F1020 Alcohol dependence, uncomplicated: Secondary | ICD-10-CM | POA: Diagnosis present

## 2020-01-25 DIAGNOSIS — Z7901 Long term (current) use of anticoagulants: Secondary | ICD-10-CM | POA: Diagnosis not present

## 2020-01-25 DIAGNOSIS — Z823 Family history of stroke: Secondary | ICD-10-CM

## 2020-01-25 DIAGNOSIS — Z6825 Body mass index (BMI) 25.0-25.9, adult: Secondary | ICD-10-CM | POA: Diagnosis not present

## 2020-01-25 DIAGNOSIS — W19XXXA Unspecified fall, initial encounter: Secondary | ICD-10-CM | POA: Diagnosis present

## 2020-01-25 DIAGNOSIS — F32A Depression, unspecified: Secondary | ICD-10-CM

## 2020-01-25 DIAGNOSIS — Z87891 Personal history of nicotine dependence: Secondary | ICD-10-CM

## 2020-01-25 DIAGNOSIS — I480 Paroxysmal atrial fibrillation: Secondary | ICD-10-CM | POA: Diagnosis present

## 2020-01-25 DIAGNOSIS — G4730 Sleep apnea, unspecified: Secondary | ICD-10-CM | POA: Insufficient documentation

## 2020-01-25 DIAGNOSIS — S72451A Displaced supracondylar fracture without intracondylar extension of lower end of right femur, initial encounter for closed fracture: Secondary | ICD-10-CM | POA: Diagnosis present

## 2020-01-25 DIAGNOSIS — Z79899 Other long term (current) drug therapy: Secondary | ICD-10-CM

## 2020-01-25 DIAGNOSIS — Z419 Encounter for procedure for purposes other than remedying health state, unspecified: Secondary | ICD-10-CM

## 2020-01-25 DIAGNOSIS — K922 Gastrointestinal hemorrhage, unspecified: Secondary | ICD-10-CM | POA: Diagnosis not present

## 2020-01-25 DIAGNOSIS — R0902 Hypoxemia: Secondary | ICD-10-CM | POA: Diagnosis present

## 2020-01-25 DIAGNOSIS — K644 Residual hemorrhoidal skin tags: Secondary | ICD-10-CM | POA: Diagnosis present

## 2020-01-25 DIAGNOSIS — Z880 Allergy status to penicillin: Secondary | ICD-10-CM

## 2020-01-25 DIAGNOSIS — K449 Diaphragmatic hernia without obstruction or gangrene: Secondary | ICD-10-CM | POA: Diagnosis present

## 2020-01-25 DIAGNOSIS — I428 Other cardiomyopathies: Secondary | ICD-10-CM

## 2020-01-25 DIAGNOSIS — I4510 Unspecified right bundle-branch block: Secondary | ICD-10-CM | POA: Diagnosis present

## 2020-01-25 DIAGNOSIS — K5731 Diverticulosis of large intestine without perforation or abscess with bleeding: Secondary | ICD-10-CM | POA: Diagnosis present

## 2020-01-25 DIAGNOSIS — K625 Hemorrhage of anus and rectum: Secondary | ICD-10-CM

## 2020-01-25 DIAGNOSIS — I10 Essential (primary) hypertension: Secondary | ICD-10-CM | POA: Diagnosis present

## 2020-01-25 DIAGNOSIS — D6959 Other secondary thrombocytopenia: Secondary | ICD-10-CM | POA: Diagnosis present

## 2020-01-25 DIAGNOSIS — K7460 Unspecified cirrhosis of liver: Secondary | ICD-10-CM | POA: Diagnosis present

## 2020-01-25 DIAGNOSIS — Z9884 Bariatric surgery status: Secondary | ICD-10-CM

## 2020-01-25 DIAGNOSIS — E785 Hyperlipidemia, unspecified: Secondary | ICD-10-CM | POA: Diagnosis present

## 2020-01-25 DIAGNOSIS — I959 Hypotension, unspecified: Secondary | ICD-10-CM | POA: Diagnosis not present

## 2020-01-25 DIAGNOSIS — E669 Obesity, unspecified: Secondary | ICD-10-CM | POA: Diagnosis present

## 2020-01-25 DIAGNOSIS — E119 Type 2 diabetes mellitus without complications: Secondary | ICD-10-CM

## 2020-01-25 HISTORY — DX: Type 2 diabetes mellitus without complications: E11.9

## 2020-01-25 LAB — CBC WITH DIFFERENTIAL/PLATELET
Abs Immature Granulocytes: 0.04 10*3/uL (ref 0.00–0.07)
Abs Immature Granulocytes: 0.02 10*3/uL (ref 0.00–0.07)
Basophils Absolute: 0 10*3/uL (ref 0.0–0.1)
Basophils Absolute: 0 10*3/uL (ref 0.0–0.1)
Basophils Relative: 0 %
Basophils Relative: 0 %
Eosinophils Absolute: 0 10*3/uL (ref 0.0–0.5)
Eosinophils Absolute: 0 10*3/uL (ref 0.0–0.5)
Eosinophils Relative: 0 %
Eosinophils Relative: 0 %
HCT: 25.7 % — ABNORMAL LOW (ref 39.0–52.0)
HCT: 25 % — ABNORMAL LOW (ref 39.0–52.0)
Hemoglobin: 8.7 g/dL — ABNORMAL LOW (ref 13.0–17.0)
Hemoglobin: 8.4 g/dL — ABNORMAL LOW (ref 13.0–17.0)
Immature Granulocytes: 0 %
Immature Granulocytes: 0 %
Lymphocytes Relative: 5 %
Lymphocytes Relative: 9 %
Lymphs Abs: 0.5 10*3/uL — ABNORMAL LOW (ref 0.7–4.0)
Lymphs Abs: 0.7 10*3/uL (ref 0.7–4.0)
MCH: 34.3 pg — ABNORMAL HIGH (ref 26.0–34.0)
MCH: 33.7 pg (ref 26.0–34.0)
MCHC: 33.9 g/dL (ref 30.0–36.0)
MCHC: 33.6 g/dL (ref 30.0–36.0)
MCV: 101.2 fL — ABNORMAL HIGH (ref 80.0–100.0)
MCV: 100.4 fL — ABNORMAL HIGH (ref 80.0–100.0)
Monocytes Absolute: 0.7 10*3/uL (ref 0.1–1.0)
Monocytes Absolute: 0.8 10*3/uL (ref 0.1–1.0)
Monocytes Relative: 7 %
Monocytes Relative: 10 %
Neutro Abs: 9 10*3/uL — ABNORMAL HIGH (ref 1.7–7.7)
Neutro Abs: 6.9 10*3/uL (ref 1.7–7.7)
Neutrophils Relative %: 88 %
Neutrophils Relative %: 81 %
Platelets: 123 10*3/uL — ABNORMAL LOW (ref 150–400)
Platelets: 114 10*3/uL — ABNORMAL LOW (ref 150–400)
RBC: 2.54 MIL/uL — ABNORMAL LOW (ref 4.22–5.81)
RBC: 2.49 MIL/uL — ABNORMAL LOW (ref 4.22–5.81)
RDW: 12.9 % (ref 11.5–15.5)
RDW: 13.9 % (ref 11.5–15.5)
WBC: 10.2 10*3/uL (ref 4.0–10.5)
WBC: 8.5 10*3/uL (ref 4.0–10.5)
nRBC: 0 % (ref 0.0–0.2)
nRBC: 0 % (ref 0.0–0.2)

## 2020-01-25 LAB — PROTIME-INR
INR: 1.1 (ref 0.8–1.2)
Prothrombin Time: 14.2 seconds (ref 11.4–15.2)

## 2020-01-25 LAB — ACETAMINOPHEN LEVEL: Acetaminophen (Tylenol), Serum: <10 ug/mL — ABNORMAL LOW (ref 10–30)

## 2020-01-25 LAB — COMPREHENSIVE METABOLIC PANEL
ALT: 20 U/L (ref 0–44)
AST: 22 U/L (ref 15–41)
Albumin: 3 g/dL — ABNORMAL LOW (ref 3.5–5.0)
Alkaline Phosphatase: 94 U/L (ref 38–126)
Anion gap: 7 (ref 5–15)
BUN: 19 mg/dL (ref 8–23)
CO2: 23 mmol/L (ref 22–32)
Calcium: 8 mg/dL — ABNORMAL LOW (ref 8.9–10.3)
Chloride: 109 mmol/L (ref 98–111)
Creatinine, Ser: 0.55 mg/dL — ABNORMAL LOW (ref 0.61–1.24)
GFR, Estimated: >60 mL/min (ref >60)
Glucose, Bld: 147 mg/dL — ABNORMAL HIGH (ref 70–99)
Potassium: 4.6 mmol/L (ref 3.5–5.1)
Sodium: 139 mmol/L (ref 135–145)
Total Bilirubin: 2.1 mg/dL — ABNORMAL HIGH (ref 0.3–1.2)
Total Protein: 5.5 g/dL — ABNORMAL LOW (ref 6.5–8.1)

## 2020-01-25 LAB — TYPE AND SCREEN
ABO/RH(D): A POS
Unit division: 0

## 2020-01-25 LAB — HEMOGLOBIN AND HEMATOCRIT, BLOOD
HCT: 23.6 % — ABNORMAL LOW (ref 39.0–52.0)
Hemoglobin: 8 g/dL — ABNORMAL LOW (ref 13.0–17.0)

## 2020-01-25 LAB — ABO/RH: ABO/RH(D): A POS

## 2020-01-25 LAB — RESPIRATORY PANEL BY RT PCR (FLU A&B, COVID)
Influenza A by PCR: NEGATIVE
Influenza B by PCR: NEGATIVE
SARS Coronavirus 2 by RT PCR: NEGATIVE

## 2020-01-25 LAB — PREPARE RBC (CROSSMATCH)

## 2020-01-25 LAB — LACTIC ACID, PLASMA: Lactic Acid, Venous: 1.7 mmol/L (ref 0.5–1.9)

## 2020-01-25 IMAGING — CT CT CTA ABD/PEL W/CM AND/OR W/O CM
3 of 10 series · 11 of 46 positions shown, 17 images · IV contrast (omnipaque)
Comparison: None.

CLINICAL DATA: Pelvic pain and GI bleeding, initial encounter

EXAM:
CTA ABDOMEN AND PELVIS WITHOUT AND WITH CONTRAST
TECHNIQUE: Multidetector CT imaging of the abdomen and pelvis was performed
using the standard protocol during bolus administration of
intravenous contrast. Multiplanar reconstructed images and MIPs were
obtained and reviewed to evaluate the vascular anatomy.
CONTRAST:  100mL OMNIPAQUE IOHEXOL 350 MG/ML SOLN

[Series 5: axial arterial · axial · arterial · 0.89mm/px · z∈[-1317,-1065]mm · 5 of 253 slices shown]
[im 19/253  soft-tissue]
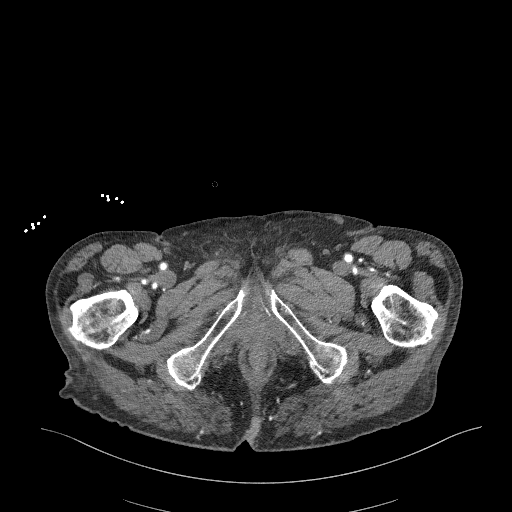
[im 55/253  soft-tissue]
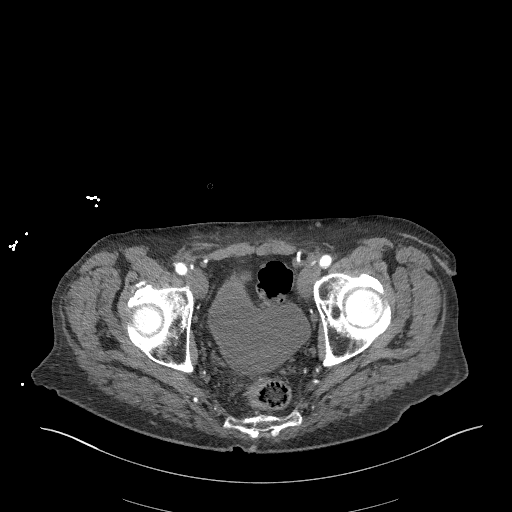
[im 91/253  soft-tissue]
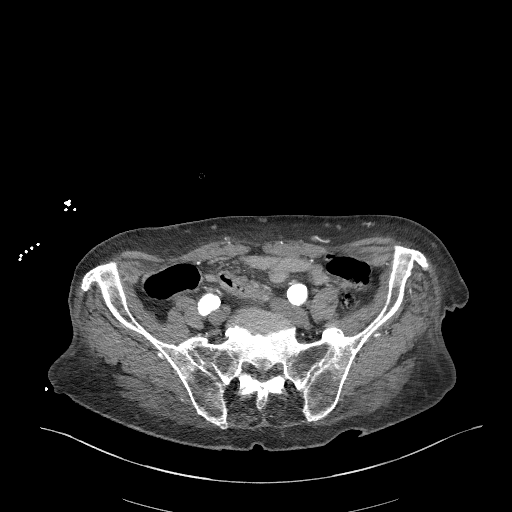
[im 109/253  soft-tissue]
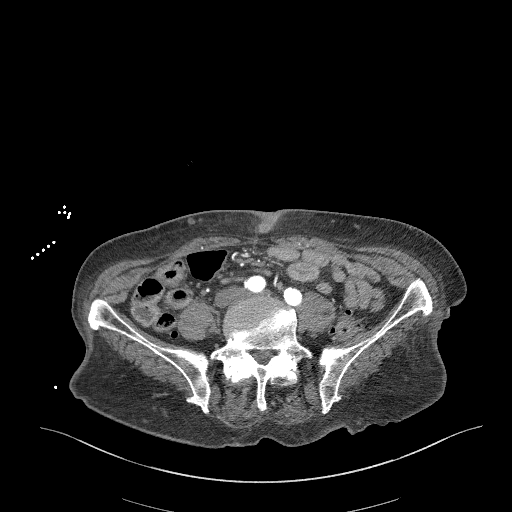
[im 145/253  soft-tissue]
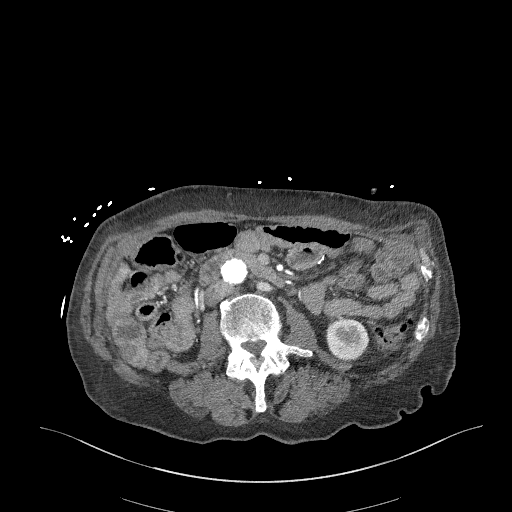

[Series 6: axial venous · axial · portal-venous · 0.89mm/px · z∈[-1259,-954]mm · 4 of 103 slices shown, 9 images]
[im 21/103  soft-tissue]
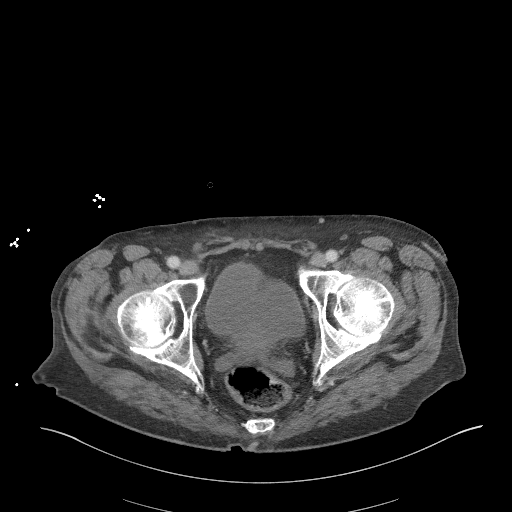
[im 21/103  lung]
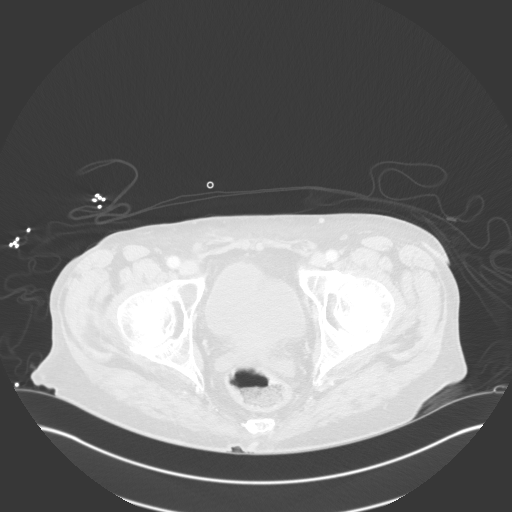
[im 21/103  bone]
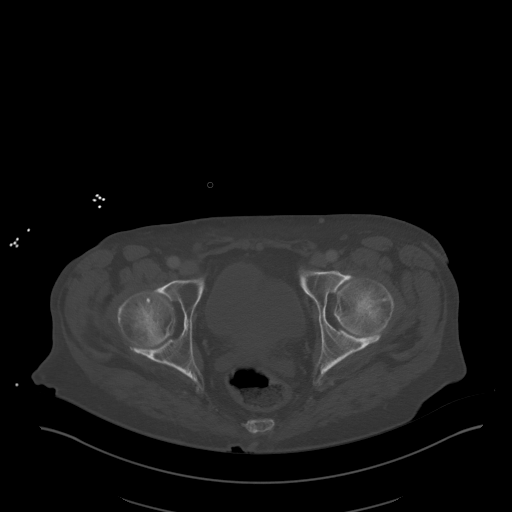
[im 41/103  soft-tissue]
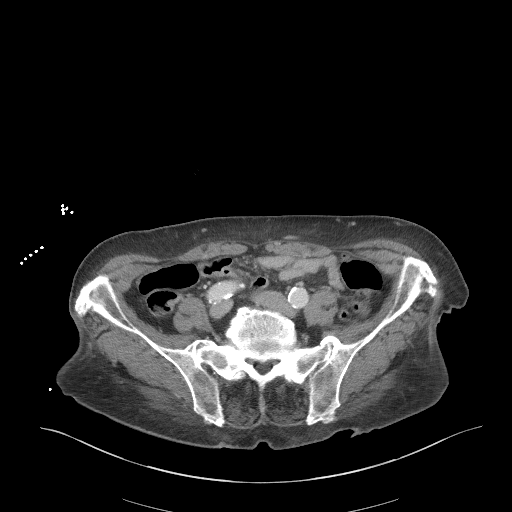
[im 41/103  lung]
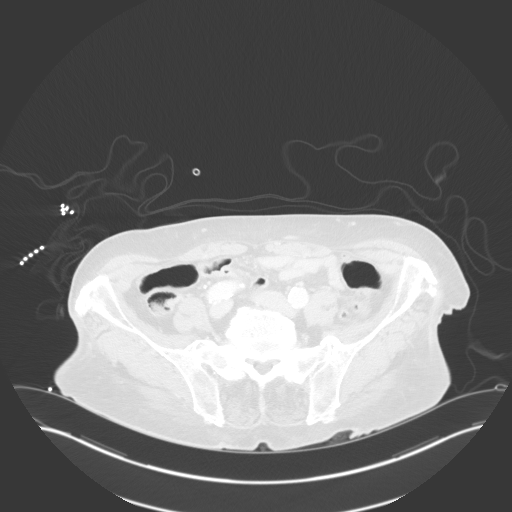
[im 62/103  soft-tissue]
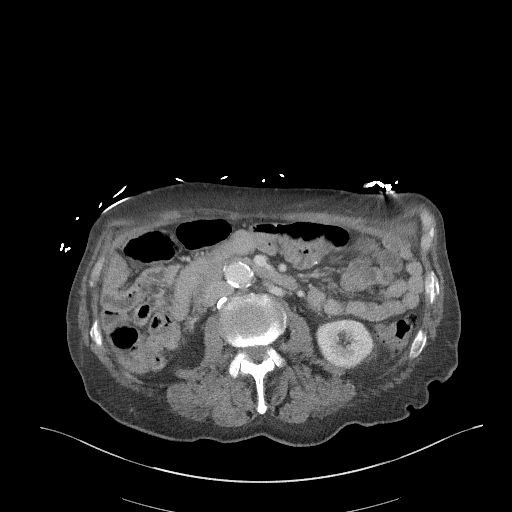
[im 62/103  lung]
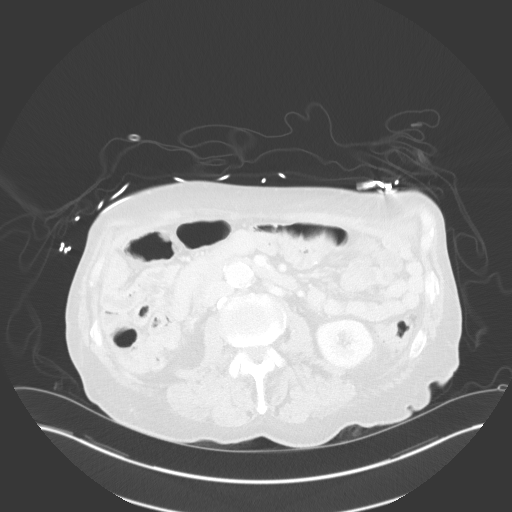
[im 82/103  soft-tissue]
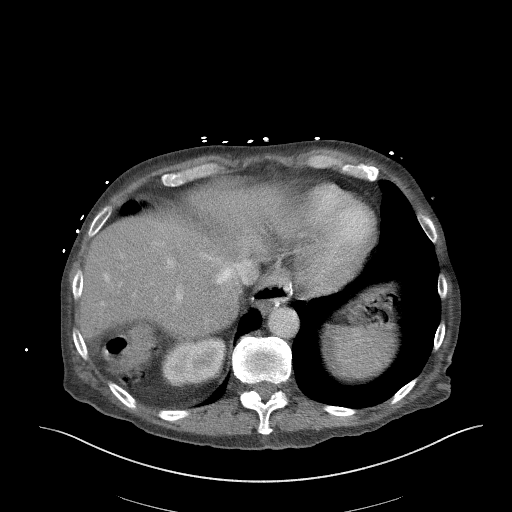
[im 82/103  lung]
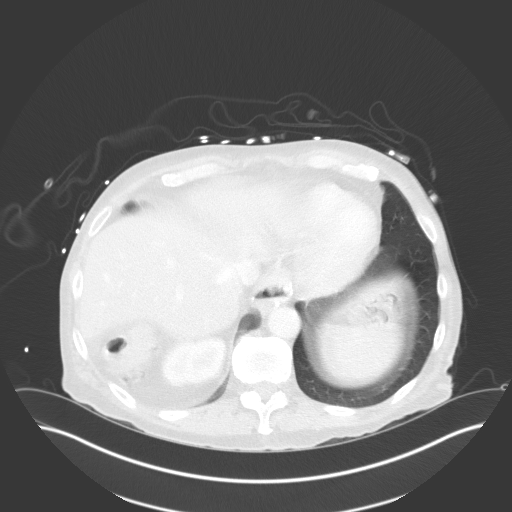

[Series 9: coronal mpr · coronal · 0.75mm/px · 2 of 144 slices shown, 3 images]
[im 48/144  soft-tissue]
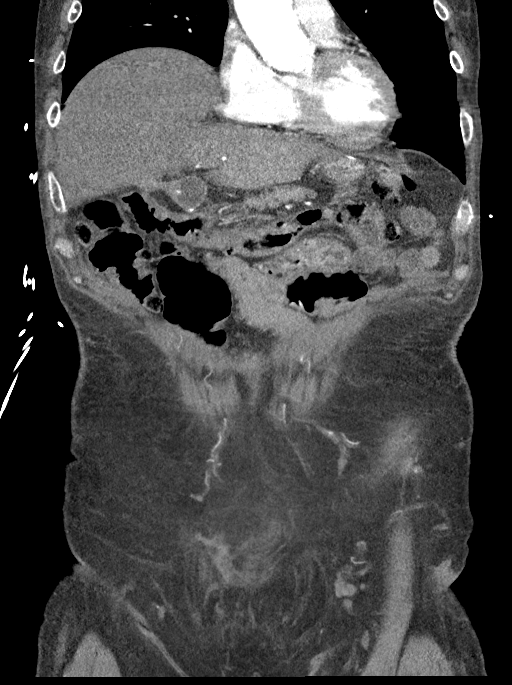
[im 48/144  bone]
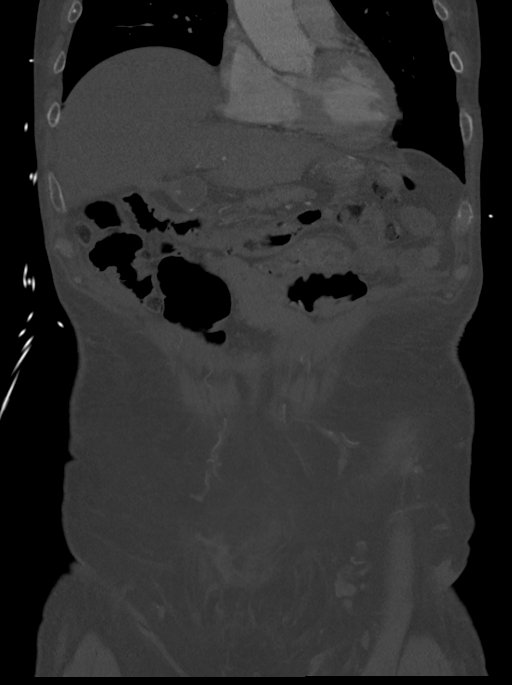
[im 96/144  soft-tissue]
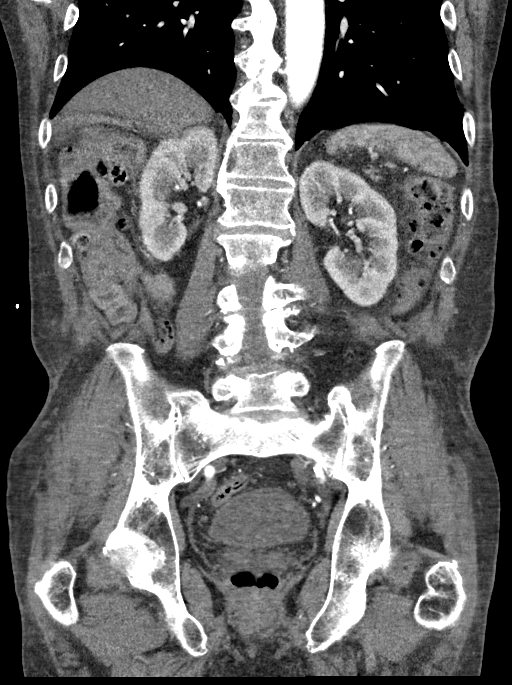

[11 of 46 positions shown; findings below may reference images not displayed]

FINDINGS: VASCULAR

Aorta: Abdominal aorta demonstrates diffuse atherosclerotic
calcifications. No aneurysmal dilatation or dissection is noted.

Celiac: Atherosclerotic calcifications are noted although no focal
stenosis is seen.

SMA: Atherosclerotic calcifications are noted although no focal
stenosis is noted.

Renals: Dual renal arteries on the right with single renal artery on
the left. Atherosclerotic changes are seen. No focal stenosis is
noted.

IMA: Patent without evidence of aneurysm, dissection, vasculitis or
significant stenosis.

Iliacs: Atherosclerotic calcifications are noted without aneurysmal
dilatation or dissection.

Veins: No specific venous abnormality is noted.

Review of the MIP images confirms the above findings.

NON-VASCULAR

Lower chest: Emphysematous changes are noted. No focal infiltrate or
effusion is seen.

Hepatobiliary: Mild fatty infiltration of the liver is noted. The
gallbladder is decompressed.

Pancreas: Unremarkable. No pancreatic ductal dilatation or
surrounding inflammatory changes.

Spleen: Normal in size without focal abnormality.

Adrenals/Urinary Tract: Adrenal glands are within normal limits.
Kidneys demonstrate a normal enhancement pattern bilaterally. The
bladder is partially distended.

Stomach/Bowel: Colon shows diverticular change without evidence of
diverticulitis. No obstructive or inflammatory changes are seen.
Small bowel and stomach appear within normal limits. There are no
areas of extravasation of contrast material to suggest acute GI
hemorrhage. Postsurgical changes consistent with gastric bypass are
seen.

Lymphatic: No specific lymphadenopathy is noted.

Reproductive: Prostate is unremarkable.

Other: No abdominal wall hernia or abnormality. No abdominopelvic
ascites.

Musculoskeletal: Degenerative changes of lumbar spine are noted.
IMPRESSION: VASCULAR

Atherosclerotic changes are noted. No dissection or aneurysmal
dilatation is seen.

No findings to suggest active GI hemorrhage are seen.

NON-VASCULAR

Diverticulosis without diverticulitis.

Fatty liver.

Aortic Atherosclerosis ([LC]-[LC]) and Emphysema ([LC]-[LC]).

## 2020-01-25 MED ORDER — THIAMINE HCL 100 MG PO TABS
100.0000 mg | ORAL_TABLET | Freq: Every day | ORAL | Status: DC
  Administered 2020-01-26 – 2020-02-06 (×8): 100 mg via ORAL
  Filled 2020-01-25 (×9): qty 1

## 2020-01-25 MED ORDER — DEXTROSE IN LACTATED RINGERS 5 % IV SOLN: INTRAVENOUS | Status: DC

## 2020-01-25 MED ORDER — SODIUM CHLORIDE 0.9 % IV SOLN
8.0000 mg/h | INTRAVENOUS | Status: DC
  Administered 2020-01-25: 8 mg/h via INTRAVENOUS
  Filled 2020-01-25: qty 80

## 2020-01-25 MED ORDER — IOHEXOL 350 MG/ML SOLN
100.0000 mL | Freq: Once | INTRAVENOUS | Status: AC | PRN
  Administered 2020-01-25: 100 mL via INTRAVENOUS

## 2020-01-25 MED ORDER — MORPHINE SULFATE (PF) 2 MG/ML IV SOLN: 2.0000 mg | INTRAVENOUS | Status: DC | PRN

## 2020-01-25 MED ORDER — ONDANSETRON HCL 4 MG PO TABS: 4.0000 mg | ORAL_TABLET | Freq: Four times a day (QID) | ORAL | Status: DC | PRN

## 2020-01-25 MED ORDER — ONDANSETRON HCL 4 MG/2ML IJ SOLN: 4.0000 mg | Freq: Four times a day (QID) | INTRAMUSCULAR | Status: DC | PRN

## 2020-01-25 MED ORDER — PANTOPRAZOLE SODIUM 40 MG IV SOLR
80.0000 mg | Freq: Once | INTRAVENOUS | Status: AC
  Administered 2020-01-25: 80 mg via INTRAVENOUS
  Filled 2020-01-25: qty 80

## 2020-01-25 MED ORDER — THIAMINE HCL 100 MG/ML IJ SOLN
100.0000 mg | Freq: Every day | INTRAMUSCULAR | Status: DC
  Filled 2020-01-25 (×2): qty 2

## 2020-01-25 MED ORDER — PANTOPRAZOLE SODIUM 40 MG IV SOLR
40.0000 mg | Freq: Two times a day (BID) | INTRAVENOUS | Status: DC
  Administered 2020-01-29: 40 mg via INTRAVENOUS
  Filled 2020-01-25: qty 40

## 2020-01-25 MED ORDER — LORAZEPAM 1 MG PO TABS: 1.0000 mg | ORAL_TABLET | ORAL | Status: AC | PRN

## 2020-01-25 MED ORDER — LORAZEPAM 2 MG/ML IJ SOLN: 1.0000 mg | INTRAMUSCULAR | Status: AC | PRN

## 2020-01-25 MED ORDER — SODIUM CHLORIDE 0.9 % IV SOLN
10.0000 mL/h | Freq: Once | INTRAVENOUS | Status: AC
  Administered 2020-01-25: 10 mL/h via INTRAVENOUS

## 2020-01-25 NOTE — ED Notes (Signed)
Pt back from CT

## 2020-01-25 NOTE — ED Notes (Signed)
Per pt he stated he drinks aprox 1 wine bottle a day.

## 2020-01-25 NOTE — ED Notes (Signed)
Pt taken by CT 

## 2020-01-25 NOTE — ED Notes (Signed)
Hospital Bed ordered for patient.

## 2020-01-25 NOTE — H&P (Signed)
History and Physical   James Sanford WLN:[id number] DOB: Feb 22, 1956 DOA: 01/25/2020  Referring MD/NP/PA: Dr. Roxan Hockey  PCP: Jerl Mina, MD   Outpatient Specialists: None  Patient coming from: Home  Chief Complaint: Right knee faint from recent fall and GI bleed  HPI: James Sanford is a 64 y.o. male with medical history significant of alcohol abuse, diabetes, hyperlipidemia, paroxysmal atrial fibrillation hypertension, obstructive sleep apnea, who was brought in by wife secondary to rectal bleed.  Wife reported multiple episode of bright red rectal bleed.  There was also some dark tarry stool.  Patient is excessive alcohol of 2 5 drinks a day.  Denied any nausea or vomiting.  Has had previous gastric bypass surgery.  No prior known GI bleed.  Patient was notably weak and slightly confused.  He was slightly tremulous.  Last drink was yesterday.  His initial hemoglobin was 8.4 but dropped to 8.0 while in the ER.  He is being admitted with GI bleed probably secondary to his alcohol abuse.  His LFTs were notably okay..  ED Course: Temperature 99.7 blood pressure 127/88 pulse 99 respirate 22 oxygen sat 82% on room air initially.  White count 8.5 hemoglobin 8.4 and platelets 114.  Sodium 139, creatinine 1.55 calcium 8.0 glucose 147 INR 1.1 lactic acid 1.7.  The rest of the chemistry appeared to be within normal.  Fecal occult blood tests are probably positive.  Patient was transfused 1 unit of packed red blood cells in the ER and is being admitted with GI bleed.  GI was consulted by the ER.  Review of Systems: As per HPI otherwise 10 point review of systems negative.    Past Medical History:  Diagnosis Date  . Arthritis   . Diabetes mellitus type 2, uncomplicated (HCC) 01/25/2020  . Hyperlipemia   . Hypertension   . Sleep apnea     Past Surgical History:  Procedure Laterality Date  . GASTRIC BYPASS    . HERNIA REPAIR    . JOINT REPLACEMENT Bilateral    Knee surgery    . OPEN REDUCTION INTERNAL FIXATION (ORIF) DISTAL RADIAL FRACTURE Right 10/30/2017   Procedure: OPEN REDUCTION INTERNAL FIXATION (ORIF) DISTAL RADIAL FRACTURE;  Surgeon: Garnette Gunner, MD;  Location: [facility] ORS;  Service: Orthopedics;  Laterality: Right;     reports that he has never smoked. He has quit using smokeless tobacco.  His smokeless tobacco use included chew. He reports current alcohol use. He reports previous drug use.  No Known Allergies  Family History  Problem Relation Age of Onset  . Stroke Mother   . Suicidality Father      Prior to Admission medications   Medication Sig Start Date End Date Taking? Authorizing Provider  aspirin EC 325 MG EC tablet Take 1 tablet (325 mg total) by mouth daily with breakfast. 10/31/17   Dedra Skeens, PA-C  ELIQUIS 5 MG TABS tablet Take 5 mg by mouth 2 (two) times daily. 01/05/20   [provider]  hydrALAZINE (APRESOLINE) 25 MG tablet Take 1 tablet (25 mg total) by mouth 2 (two) times daily. 10/31/17 11/30/17  Ihor Austin, MD  lisinopril (ZESTRIL) 5 MG tablet Take 5 mg by mouth daily. 08/29/19   [provider]  metoprolol succinate (TOPROL-XL) 50 MG 24 hr tablet Take 50 mg by mouth daily. 08/31/19   [provider]  metoprolol tartrate (LOPRESSOR) 25 MG tablet Take 1 tablet (25 mg total) by mouth 2 (two) times daily. 10/31/17 11/30/17  Ihor Austin, MD  Multiple Vitamin (MULTI-VITAMIN) tablet Take 1 tablet by mouth daily.    [provider]  oxyCODONE (OXY IR/ROXICODONE) 5 MG immediate release tablet Take 1 tablet (5 mg total) by mouth every 4 (four) hours as needed for severe pain (pain score 7-10). 10/31/17   Dedra Skeens, PA-C    Physical Exam: Vitals:   01/25/20 1715 01/25/20 1730 01/25/20 1745 01/25/20 1836  BP: 110/68   104/70  Pulse: 93 94 99 94  Resp: 17 15 16 17   Temp:    99.7 F (37.6 C)  TempSrc:    Oral  SpO2: 100% 100% 100% 100%  Weight:      Height:          Constitutional: Acutely  ill looking, mildly anxious Vitals:   01/25/20 1715 01/25/20 1730 01/25/20 1745 01/25/20 1836  BP: 110/68   104/70  Pulse: 93 94 99 94  Resp: 17 15 16 17   Temp:    99.7 F (37.6 C)  TempSrc:    Oral  SpO2: 100% 100% 100% 100%  Weight:      Height:       Eyes: PERRL, lids and conjunctivae pale ENMT: Mucous membranes are moist. Posterior pharynx clear of any exudate or lesions.Normal dentition.  Neck: normal, supple, no masses, no thyromegaly Respiratory: clear to auscultation bilaterally, no wheezing, no crackles. Normal respiratory effort. No accessory muscle use.  Cardiovascular: Sinus tachycardia, no murmurs / rubs / gallops. No extremity edema. 2+ pedal pulses. No carotid bruits.  Abdomen: no tenderness, no masses palpated. No hepatosplenomegaly. Bowel sounds positive.  Musculoskeletal: no clubbing / cyanosis. No joint deformity upper and lower extremities. Good ROM, no contractures. Normal muscle tone.  Skin: no rashes, lesions, ulcers. No induration Neurologic: CN 2-12 grossly intact. Sensation intact, DTR normal. Strength 5/5 in all 4.  Psychiatric: Normal judgment and insight. Alert and oriented x 3.  Anxious mood.     Labs on Admission: I have personally reviewed following labs and imaging studies  CBC: Recent Labs  Lab 01/25/20 1543 01/25/20 1754  WBC 10.2  --   NEUTROABS 9.0*  --   HGB 8.7* 8.0*  HCT 25.7* 23.6*  MCV 101.2*  --   PLT 123*  --    Basic Metabolic Panel: Recent Labs  Lab 01/25/20 1543  NA 139  K 4.6  CL 109  CO2 23  GLUCOSE 147*  BUN 19  CREATININE 0.55*  CALCIUM 8.0*   GFR: Estimated Creatinine Clearance: 105.1 mL/min (A) (by C-G formula based on SCr of 0.55 mg/dL (L)). Liver Function Tests: Recent Labs  Lab 01/25/20 1543  AST 22  ALT 20  ALKPHOS 94  BILITOT 2.1*  PROT 5.5*  ALBUMIN 3.0*   No results for input(s): LIPASE, AMYLASE in the last 168 hours. No results for input(s): AMMONIA in the last 168 hours. Coagulation  Profile: Recent Labs  Lab 01/25/20 1543  INR 1.1   Cardiac Enzymes: No results for input(s): CKTOTAL, CKMB, CKMBINDEX, TROPONINI in the last 168 hours. BNP (last 3 results) No results for input(s): PROBNP in the last 8760 hours. HbA1C: No results for input(s): HGBA1C in the last 72 hours. CBG: No results for input(s): GLUCAP in the last 168 hours. Lipid Profile: No results for input(s): CHOL, HDL, LDLCALC, TRIG, CHOLHDL, LDLDIRECT in the last 72 hours. Thyroid Function Tests: No results for input(s): TSH, T4TOTAL, FREET4, T3FREE, THYROIDAB in the last 72 hours. Anemia Panel: No results for input(s): VITAMINB12, FOLATE, FERRITIN, TIBC, IRON, RETICCTPCT in the  last 72 hours. Urine analysis:    Component Value Date/Time   COLORURINE AMBER (A) 10/29/2017 1851   APPEARANCEUR HAZY (A) 10/29/2017 1851   LABSPEC 1.020 10/29/2017 1851   PHURINE 5.0 10/29/2017 1851   GLUCOSEU NEGATIVE 10/29/2017 1851   HGBUR NEGATIVE 10/29/2017 1851   BILIRUBINUR NEGATIVE 10/29/2017 1851   KETONESUR NEGATIVE 10/29/2017 1851   PROTEINUR NEGATIVE 10/29/2017 1851   NITRITE NEGATIVE 10/29/2017 1851   LEUKOCYTESUR MODERATE (A) 10/29/2017 1851   Sepsis Labs: (procalcitonin:4,lacticidven:4) ) Recent Results (from the past 240 hour(s))  Respiratory Panel by RT PCR (Flu A&B, Covid) - Nasopharyngeal Swab     Status: None   Collection Time: 01/25/20  3:44 PM   Specimen: Nasopharyngeal Swab  Result Value Ref Range Status   SARS Coronavirus 2 by RT PCR NEGATIVE NEGATIVE Final    Comment: (NOTE) SARS-CoV-2 target nucleic acids are NOT DETECTED.  The SARS-CoV-2 RNA is generally detectable in upper respiratoy specimens during the acute phase of infection. The lowest concentration of SARS-CoV-2 viral copies this assay can detect is 131 copies/mL. A negative result does not preclude SARS-Cov-2 infection and should not be used as the sole basis for treatment or other patient management decisions. A  negative result may occur with  improper specimen collection/handling, submission of specimen other than nasopharyngeal swab, presence of viral mutation(s) within the areas targeted by this assay, and inadequate number of viral copies (<131 copies/mL). A negative result must be combined with clinical observations, patient history, and epidemiological information. The expected result is Negative.  Fact Sheet for Patients:  https://www.moore.com/  Fact Sheet for Healthcare Providers:  https://www.young.biz/  This test is no t yet approved or cleared by the [place] FDA and  has been authorized for detection and/or diagnosis of SARS-CoV-2 by FDA under an Emergency Use Authorization (EUA). This EUA will remain  in effect (meaning this test can be used) for the duration of the COVID-19 declaration under Section 564(b)(1) of the Act, 21 U.S.C. section 360bbb-3(b)(1), unless the authorization is terminated or revoked sooner.     Influenza A by PCR NEGATIVE NEGATIVE Final   Influenza B by PCR NEGATIVE NEGATIVE Final    Comment: (NOTE) The Xpert Xpress SARS-CoV-2/FLU/RSV assay is intended as an aid in  the diagnosis of influenza from Nasopharyngeal swab specimens and  should not be used as a sole basis for treatment. Nasal washings and  aspirates are unacceptable for Xpert Xpress SARS-CoV-2/FLU/RSV  testing.  Fact Sheet for Patients: https://www.moore.com/  Fact Sheet for Healthcare Providers: https://www.young.biz/  This test is not yet approved or cleared by the [place] FDA and  has been authorized for detection and/or diagnosis of SARS-CoV-2 by  FDA under an Emergency Use Authorization (EUA). This EUA will remain  in effect (meaning this test can be used) for the duration of the  Covid-19 declaration under Section 564(b)(1) of the Act, 21  U.S.C. section 360bbb-3(b)(1), unless the authorization  is  terminated or revoked. Performed at [facility], [address] [place], [place] [address]      Radiological Exams on Admission: CT Angio Abd/Pel W and/or Wo Contrast  Result Date: 01/25/2020 CLINICAL DATA:  Pelvic pain and GI bleeding, initial encounter EXAM: CTA ABDOMEN AND PELVIS WITHOUT AND WITH CONTRAST TECHNIQUE: Multidetector CT imaging of the abdomen and pelvis was performed using the standard protocol during bolus administration of intravenous contrast. Multiplanar reconstructed images and MIPs were obtained and reviewed to evaluate the vascular anatomy. CONTRAST:  OMNIPAQUE IOHEXOL  350 MG/ML SOLN COMPARISON:  None. FINDINGS: VASCULAR Aorta: Abdominal aorta demonstrates diffuse atherosclerotic calcifications. No aneurysmal dilatation or dissection is noted. Celiac: Atherosclerotic calcifications are noted although no focal stenosis is seen. SMA: Atherosclerotic calcifications are noted although no focal stenosis is noted. Renals: Dual renal arteries on the right with single renal artery on the left. Atherosclerotic changes are seen. No focal stenosis is noted. IMA: Patent without evidence of aneurysm, dissection, vasculitis or significant stenosis. Iliacs: Atherosclerotic calcifications are noted without aneurysmal dilatation or dissection. Veins: No specific venous abnormality is noted. Review of the MIP images confirms the above findings. NON-VASCULAR Lower chest: Emphysematous changes are noted. No focal infiltrate or effusion is seen. Hepatobiliary: Mild fatty infiltration of the liver is noted. The gallbladder is decompressed. Pancreas: Unremarkable. No pancreatic ductal dilatation or surrounding inflammatory changes. Spleen: Normal in size without focal abnormality. Adrenals/Urinary Tract: Adrenal glands are within normal limits. Kidneys demonstrate a normal enhancement pattern bilaterally. The bladder is partially distended. Stomach/Bowel: Colon shows diverticular  change without evidence of diverticulitis. No obstructive or inflammatory changes are seen. Small bowel and stomach appear within normal limits. There are no areas of extravasation of contrast material to suggest acute GI hemorrhage. Postsurgical changes consistent with gastric bypass are seen. Lymphatic: No specific lymphadenopathy is noted. Reproductive: Prostate is unremarkable. Other: No abdominal wall hernia or abnormality. No abdominopelvic ascites. Musculoskeletal: Degenerative changes of lumbar spine are noted. IMPRESSION: VASCULAR Atherosclerotic changes are noted. No dissection or aneurysmal dilatation is seen. No findings to suggest active GI hemorrhage are seen. NON-VASCULAR Diverticulosis without diverticulitis. Fatty liver. Aortic Atherosclerosis (ICD10-I70.0) and Emphysema (ICD10-J43.9). Electronically Signed   By: Alcide Clever M.D.   On: 01/25/2020 17:39      Assessment/Plan Principal Problem:   GI bleed Active Problems:   Alcohol abuse   Diabetes mellitus type 2, uncomplicated (HCC)   Cardiomyopathy, idiopathic (HCC)   Hyperlipidemia   Paroxysmal A-fib (HCC)   Obesity   Hypertension     #1 painless rectal bleed: Suspected diverticular disease.  Could also be related to alcohol abuse may be gastritis esophagitis.  Does not appear to have changes in his LFTs and no evidence of liver cirrhosis or portal hypertension.  CT abdomen pelvis did show diverticulosis without diverticulitis.  We will admit the patient.  IV Protonix.  Serial H&H.  I agree with the transfusion.  Patient will likely need EGD with colonoscopy.  We will followed with GI series.  #2 alcohol abuse: No evidence of DTs.  Will initiate CIWA protocol if there is sign of withdrawal.  #3 diabetes: Sliding scale insulin.  Continue home regimen.  #4 paroxysmal atrial fibrillation: We will hold Eliquis due to GI bleed.  Rate is controlled.  #5 hyperlipidemia: Continue home regimen as much as possible.  #6 essential  hypertension: Hold antihypertensives in the setting of GI bleed until resolved.  #7 right knee pain: Patient reported recent fall with suspected injury to the right knee.  It does not appear swollen or tender.  We will get an x-ray of the right knee to rule out any injury.   DVT prophylaxis: SCD Code Status: Full code Family Communication: Wife at bedside Disposition Plan: Home Consults called: Dr. Denzil Magnuson, GI Admission status: Inpatient  Severity of Illness: The appropriate patient status for this patient is INPATIENT. Inpatient status is judged to be reasonable and necessary in order to provide the required intensity of service to ensure the patient's safety. The patient's presenting symptoms, physical exam findings, and initial  radiographic and laboratory data in the context of their chronic comorbidities is felt to place them at high risk for further clinical deterioration. Furthermore, it is not anticipated that the patient will be medically stable for discharge from the hospital within 2 midnights of admission. The following factors support the patient status of inpatient.   " The patient's presenting symptoms include rectal bleed. " The worrisome physical exam findings include Pale and mild trauma. " The initial radiographic and laboratory data are worrisome because of hemoglobin of 8. " The chronic co-morbidities include alcohol abuse.   * I certify that at the point of admission it is my clinical judgment that the patient will require inpatient hospital care spanning beyond 2 midnights from the point of admission due to high intensity of service, high risk for further deterioration and high frequency of surveillance required.Lonia Blood MD [facility] Pager [phone]  If 7PM-7AM, please contact night-coverage [email or web address] Password [facility]  01/25/2020, 6:42 PM

## 2020-01-25 NOTE — ED Notes (Signed)
Message sent to Hospitalist regarding blood transfusion completion.

## 2020-01-25 NOTE — ED Notes (Signed)
MD at bedside. 

## 2020-01-25 NOTE — ED Provider Notes (Signed)
[facility] Emergency Department Provider Note    First MD Initiated Contact with Patient 01/25/20 1526     (approximate)  I have reviewed the triage vital signs and the nursing notes.   HISTORY  Chief Complaint GI Problem (GI bleed, onset yesterday at 6:30pm), Weakness, and Knee Pain (Right knee pain from accidental fall )    HPI James Sanford is a 64 y.o. male with the below listed past medical history presents to the ER due to concern for GI bleeding.  Reportedly started having bright red blood per rectum similar in color mixed with some dark tarry stool last night.  Does drink alcohol daily.  Denies any pain.  Has started feeling weak and had some confusion today.  No reported history of cirrhosis or heartburn.  No history of diverticulosis.  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    Past Medical History:  Diagnosis Date  . Arthritis   . Diabetes mellitus type 2, uncomplicated (HCC) 01/25/2020  . Hyperlipemia   . Hypertension   . Sleep apnea    Family History  Problem Relation Age of Onset  . Stroke Mother   . Suicidality Father    Past Surgical History:  Procedure Laterality Date  . GASTRIC BYPASS    . HERNIA REPAIR    . JOINT REPLACEMENT Bilateral    Knee surgery  . OPEN REDUCTION INTERNAL FIXATION (ORIF) DISTAL RADIAL FRACTURE Right 10/30/2017   Procedure: OPEN REDUCTION INTERNAL FIXATION (ORIF) DISTAL RADIAL FRACTURE;  Surgeon: Garnette Gunner, MD;  Location: [facility] ORS;  Service: Orthopedics;  Laterality: Right;   Patient Active Problem List   Diagnosis Date Noted  . GI bleed 01/25/2020  . Diabetes mellitus type 2, uncomplicated (HCC) 01/25/2020  . Hyperlipidemia 01/25/2020  . Obesity 01/25/2020  . Hypertension 01/25/2020  . Sleep apnea 01/25/2020  . Alcohol abuse 03/07/2019  . Cardiomyopathy, idiopathic (HCC) 03/07/2019  . Paroxysmal A-fib (HCC) 11/07/2017  . Radius fracture 10/29/2017       Prior to Admission medications   Medication Sig Start Date End Date Taking? Authorizing Provider  B Complex-C (B-COMPLEX WITH VITAMIN C) tablet Take 1 tablet by mouth daily.   Yes [provider]  ELIQUIS 5 MG TABS tablet Take 5 mg by mouth 2 (two) times daily. 01/05/20  Yes [provider]  Garlic 1200 MG CAPS Take 1,200 mg by mouth daily.   Yes [provider]  lisinopril (ZESTRIL) 5 MG tablet Take 5 mg by mouth daily. 08/29/19  Yes [provider]  metoprolol succinate (TOPROL-XL) 50 MG 24 hr tablet Take 50 mg by mouth daily. 08/31/19  Yes [provider]  Multiple Vitamin (MULTI-VITAMIN) tablet Take 1 tablet by mouth daily.   Yes [provider]  Omega-3 Fatty Acids (FISH OIL) 1000 MG CAPS Take 1,000 mg by mouth daily.   Yes [provider]  escitalopram (LEXAPRO) 10 MG tablet Take 10 mg by mouth daily. Patient not taking: Reported on 01/25/2020 03/27/19   [provider]    Allergies Penicillins    Social History Social History   Tobacco Use  . Smoking status: Never Smoker  . Smokeless tobacco: Former Neurosurgeon    Types: Chew  . Tobacco comment: Luellen Pucker about 4 years ago  Substance Use Topics  . Alcohol use: Yes    Comment: beer occasionally  . Drug use: Not Currently    Review of Systems Patient denies headaches, rhinorrhea, blurry vision, numbness, shortness of  breath, chest pain, edema, cough, abdominal pain, nausea, vomiting, diarrhea, dysuria, fevers, rashes or hallucinations unless otherwise stated above in HPI. ____________________________________________   PHYSICAL EXAM:  VITAL SIGNS: Vitals:   01/25/20 1930 01/25/20 1945  BP: 102/75 104/73  Pulse: 88 93  Resp: 17 (!) 22  Temp:    SpO2: 100% 100%    Constitutional: Alert and oriented.  Eyes: Conjunctivae are normal.  Head: Atraumatic. Nose: No congestion/rhinnorhea. Mouth/Throat: Mucous membranes are moist.   Neck: No stridor.  Painless ROM.  Cardiovascular: Normal rate, regular rhythm. Grossly normal heart sounds.  Good peripheral circulation. Respiratory: Normal respiratory effort.  No retractions. Lungs CTAB. Gastrointestinal: Soft and nontender. No distention. No abdominal bruits. No CVA tenderness. Genitourinary: Bright red blood per rectum.  Musculoskeletal: No lower extremity tenderness nor edema.  No joint effusions. Neurologic:  Normal speech and language. No gross focal neurologic deficits are appreciated. No facial droop Skin:  Skin is warm, dry and intact. No rash noted. Psychiatric: Mood and affect are normal. Speech and behavior are normal.  ____________________________________________   LABS (all labs ordered are listed, but only abnormal results are displayed)  Results for orders placed or performed during the hospital encounter of 01/25/20 (from the past 24 hour(s))  CBC with Differential     Status: Abnormal   Collection Time: 01/25/20  3:43 PM  Result Value Ref Range   WBC 10.2 4.0 - 10.5 K/uL   RBC 2.54 (L) 4.22 - 5.81 MIL/uL   Hemoglobin 8.7 (L) 13.0 - 17.0 g/dL   HCT 59.1 (L) 39 - 52 %   MCV 101.2 (H) 80.0 - 100.0 fL   MCH 34.3 (H) 26.0 - 34.0 pg   MCHC 33.9 30.0 - 36.0 g/dL   RDW 63.8 46.6 - 59.9 %   Platelets 123 (L) 150 - 400 K/uL   nRBC 0.0 0.0 - 0.2 %   Neutrophils Relative % 88 %   Neutro Abs 9.0 (H) 1.7 - 7.7 K/uL   Lymphocytes Relative 5 %   Lymphs Abs 0.5 (L) 0.7 - 4.0 K/uL   Monocytes Relative 7 %   Monocytes Absolute 0.7 0.1 - 1.0 K/uL   Eosinophils Relative 0 %   Eosinophils Absolute 0.0 0.0 - 0.5 K/uL   Basophils Relative 0 %   Basophils Absolute 0.0 0.0 - 0.1 K/uL   Immature Granulocytes 0 %   Abs Immature Granulocytes 0.04 0.00 - 0.07 K/uL  Comprehensive metabolic panel     Status: Abnormal   Collection Time: 01/25/20  3:43 PM  Result Value Ref Range   Sodium 139 135 - 145 mmol/L   Potassium 4.6 3.5 - 5.1 mmol/L   Chloride 109 98 - 111 mmol/L   CO2 23 22 -  32 mmol/L   Glucose, Bld 147 (H) 70 - 99 mg/dL   BUN 19 8 - 23 mg/dL   Creatinine, Ser 3.57 (L) 0.61 - 1.24 mg/dL   Calcium 8.0 (L) 8.9 - 10.3 mg/dL   Total Protein 5.5 (L) 6.5 - 8.1 g/dL   Albumin 3.0 (L) 3.5 - 5.0 g/dL   AST 22 15 - 41 U/L   ALT 20 0 - 44 U/L   Alkaline Phosphatase 94 38 - 126 U/L   Total Bilirubin 2.1 (H) 0.3 - 1.2 mg/dL   GFR, Estimated >01 >77 mL/min   Anion gap 7 5 - 15  Protime-INR     Status: None   Collection Time: 01/25/20  3:43 PM  Result Value Ref Range  Prothrombin Time 14.2 11.4 - 15.2 seconds   INR 1.1 0.8 - 1.2  Type and screen [facility]     Status: None (Preliminary result)   Collection Time: 01/25/20  3:44 PM  Result Value Ref Range   ABO/RH(D) A POS    Antibody Screen NEG    Sample Expiration 01/28/2020,2359    Unit Number [id number]    Blood Component Type RED CELLS,LR    Unit division 00    Status of Unit ISSUED    Transfusion Status OK TO TRANSFUSE    Crossmatch Result      Compatible Performed at [facility], [address] [place], [place] [address]   Lactic acid, plasma     Status: None   Collection Time: 01/25/20  3:44 PM  Result Value Ref Range   Lactic Acid, Venous 1.7 0.5 - 1.9 mmol/L  Respiratory Panel by RT PCR (Flu A&B, Covid) - Nasopharyngeal Swab     Status: None   Collection Time: 01/25/20  3:44 PM   Specimen: Nasopharyngeal Swab  Result Value Ref Range   SARS Coronavirus 2 by RT PCR NEGATIVE NEGATIVE   Influenza A by PCR NEGATIVE NEGATIVE   Influenza B by PCR NEGATIVE NEGATIVE  Acetaminophen level     Status: Abnormal   Collection Time: 01/25/20  5:38 PM  Result Value Ref Range   Acetaminophen (Tylenol), Serum <10 (L) 10 - 30 ug/mL  ABO/Rh     Status: None   Collection Time: 01/25/20  5:38 PM  Result Value Ref Range   ABO/RH(D)      A POS Performed at [facility], [address] [place], [place] [address]   Hemoglobin and hematocrit, blood     Status: Abnormal    Collection Time: 01/25/20  5:54 PM  Result Value Ref Range   Hemoglobin 8.0 (L) 13.0 - 17.0 g/dL   HCT 93.2 (L) 39 - 52 %  Prepare RBC (crossmatch)     Status: None   Collection Time: 01/25/20  6:30 PM  Result Value Ref Range   Order Confirmation      ORDER PROCESSED BY BLOOD BANK Performed at [facility], [address], [place], [place] [address]    ____________________________________________  EKG My review and personal interpretation at Time: 18:06   Indication: weakness  Rate: 95  Rhythm: afib Axis: left Other: rbbb, no stemi, nonspecific st change ____________________________________________  RADIOLOGY  I personally reviewed all radiographic images ordered to evaluate for the above acute complaints and reviewed radiology reports and findings.  These findings were personally discussed with the patient.  Please see medical record for radiology report.  ____________________________________________   PROCEDURES  Procedure(s) performed:  .Critical Care Performed by: Willy Eddy, MD Authorized by: Willy Eddy, MD   Critical care provider statement:    Critical care time (minutes):  35   Critical care time was exclusive of:  Separately billable procedures and treating other patients   Critical care was necessary to treat or prevent imminent or life-threatening deterioration of the following conditions:  Circulatory failure   Critical care was time spent personally by me on the following activities:  Development of treatment plan with patient or surrogate, discussions with consultants, evaluation of patient's response to treatment, examination of patient, obtaining history from patient or surrogate, ordering and performing treatments and interventions, ordering and review of laboratory studies, ordering and review of radiographic studies, pulse oximetry, re-evaluation of patient's condition and review of old charts  Critical Care performed:  yes ____________________________________________   INITIAL IMPRESSION / ASSESSMENT AND PLAN / ED COURSE  Pertinent labs & imaging results that were available during my care of the patient were reviewed by me and considered in my medical decision making (see chart for details).   DDX: Upper GI bleed, diverticulosis, AAA, cirrhosis, variceal bleed, electrolyte abnormality  James Sanford is a 64 y.o. who presents to the ED with presentation as described above.  Patient ill-appearing but hemodynamically stable does have prior blood per rectum.  Blood work will be sent for the but differential.  Will place on cardiac monitor.  Will order CTA due to concern for active hemorrhage.  Will order type and screen.  Clinical Course as of Jan 25 2028  Fri Jan 25, 2020  1636 Noted drop in hemoglobin 8.7 consistent with active bleed.  Will start IV Protonix.  INR is normal.  LFTs otherwise normal.  Mild bump in T bili.  Will order CTA to evaluate for source of active bleeding.   [PR]  1757 Wife does report concern for significant EtOH abuse and recent depression.  CTA does not show any evidence of active hemorrhage.  Will repeat hemoglobin to see if it is continuing to drop.  Remains hemodynamically stable at this time.  Protonix infusing.  Lactate normal.  I do believe he is at this point stable for admission the hospital.   [PR]    Clinical Course User Index [PR] Willy Eddy, MD    The patient was evaluated in Emergency Department today for the symptoms described in the history of present illness. He/she was evaluated in the context of the global COVID-19 pandemic, which necessitated consideration that the patient might be at risk for infection with the SARS-CoV-2 virus that causes COVID-19. Institutional protocols and algorithms that pertain to the evaluation of patients at risk for COVID-19 are in a state of rapid change based on information released by regulatory bodies including the CDC  and federal and state organizations. These policies and algorithms were followed during the patient's care in the ED.  As part of my medical decision making, I reviewed the following data within the electronic MEDICAL RECORD NUMBER Nursing notes reviewed and incorporated, Labs reviewed, notes from prior ED visits and [place] Controlled Substance Database   ____________________________________________   FINAL CLINICAL IMPRESSION(S) / ED DIAGNOSES  Final diagnoses:  Acute GI bleeding  Alcohol abuse      NEW MEDICATIONS STARTED DURING THIS VISIT:  New Prescriptions   No medications on file     Note:  This document was prepared using Dragon voice recognition software and may include unintentional dictation errors.    Willy Eddy, MD 01/25/20 2029

## 2020-01-25 NOTE — ED Notes (Addendum)
Sent Hospitalist [facility] Billings  , Hemoglobin level.

## 2020-01-26 ENCOUNTER — Encounter: Payer: Self-pay | Admitting: Internal Medicine

## 2020-01-26 ENCOUNTER — Inpatient Hospital Stay: Payer: BC Managed Care – PPO

## 2020-01-26 ENCOUNTER — Other Ambulatory Visit: Payer: Self-pay

## 2020-01-26 DIAGNOSIS — F1010 Alcohol abuse, uncomplicated: Secondary | ICD-10-CM

## 2020-01-26 DIAGNOSIS — K922 Gastrointestinal hemorrhage, unspecified: Secondary | ICD-10-CM

## 2020-01-26 DIAGNOSIS — K625 Hemorrhage of anus and rectum: Secondary | ICD-10-CM | POA: Diagnosis not present

## 2020-01-26 DIAGNOSIS — E785 Hyperlipidemia, unspecified: Secondary | ICD-10-CM | POA: Insufficient documentation

## 2020-01-26 LAB — COMPREHENSIVE METABOLIC PANEL
ALT: 16 U/L (ref 0–44)
AST: 18 U/L (ref 15–41)
Albumin: 2.7 g/dL — ABNORMAL LOW (ref 3.5–5.0)
Alkaline Phosphatase: 83 U/L (ref 38–126)
Anion gap: 5 (ref 5–15)
BUN: 18 mg/dL (ref 8–23)
CO2: 24 mmol/L (ref 22–32)
Calcium: 7.7 mg/dL — ABNORMAL LOW (ref 8.9–10.3)
Chloride: 111 mmol/L (ref 98–111)
Creatinine, Ser: 0.67 mg/dL (ref 0.61–1.24)
GFR, Estimated: >60 mL/min (ref >60)
Glucose, Bld: 156 mg/dL — ABNORMAL HIGH (ref 70–99)
Potassium: 4 mmol/L (ref 3.5–5.1)
Sodium: 140 mmol/L (ref 135–145)
Total Bilirubin: 2.1 mg/dL — ABNORMAL HIGH (ref 0.3–1.2)
Total Protein: 5 g/dL — ABNORMAL LOW (ref 6.5–8.1)

## 2020-01-26 LAB — IRON AND TIBC
Iron: 39 ug/dL — ABNORMAL LOW (ref 45–182)
Saturation Ratios: 24 % (ref 17.9–39.5)
TIBC: 162 ug/dL — ABNORMAL LOW (ref 250–450)
UIBC: 123 ug/dL

## 2020-01-26 LAB — CBC
HCT: 23.3 % — ABNORMAL LOW (ref 39.0–52.0)
HCT: 23.4 % — ABNORMAL LOW (ref 39.0–52.0)
HCT: 25.5 % — ABNORMAL LOW (ref 39.0–52.0)
Hemoglobin: 7.7 g/dL — ABNORMAL LOW (ref 13.0–17.0)
Hemoglobin: 7.7 g/dL — ABNORMAL LOW (ref 13.0–17.0)
Hemoglobin: 8.3 g/dL — ABNORMAL LOW (ref 13.0–17.0)
MCH: 33.6 pg (ref 26.0–34.0)
MCH: 33.8 pg (ref 26.0–34.0)
MCH: 34 pg (ref 26.0–34.0)
MCHC: 33 g/dL (ref 30.0–36.0)
MCHC: 32.9 g/dL (ref 30.0–36.0)
MCHC: 32.5 g/dL (ref 30.0–36.0)
MCV: 101.7 fL — ABNORMAL HIGH (ref 80.0–100.0)
MCV: 102.6 fL — ABNORMAL HIGH (ref 80.0–100.0)
MCV: 104.5 fL — ABNORMAL HIGH (ref 80.0–100.0)
Platelets: 101 10*3/uL — ABNORMAL LOW (ref 150–400)
Platelets: 95 10*3/uL — ABNORMAL LOW (ref 150–400)
Platelets: 102 10*3/uL — ABNORMAL LOW (ref 150–400)
RBC: 2.29 MIL/uL — ABNORMAL LOW (ref 4.22–5.81)
RBC: 2.28 MIL/uL — ABNORMAL LOW (ref 4.22–5.81)
RBC: 2.44 MIL/uL — ABNORMAL LOW (ref 4.22–5.81)
RDW: 14.2 % (ref 11.5–15.5)
RDW: 14.2 % (ref 11.5–15.5)
RDW: 14.2 % (ref 11.5–15.5)
WBC: 7.3 10*3/uL (ref 4.0–10.5)
WBC: 6.9 10*3/uL (ref 4.0–10.5)
WBC: 7.4 10*3/uL (ref 4.0–10.5)
nRBC: 0 % (ref 0.0–0.2)
nRBC: 0 % (ref 0.0–0.2)
nRBC: 0 % (ref 0.0–0.2)

## 2020-01-26 LAB — FERRITIN: Ferritin: 261 ng/mL (ref 24–336)

## 2020-01-26 LAB — FOLATE: Folate: 2.6 ng/mL — ABNORMAL LOW (ref >5.9)

## 2020-01-26 LAB — BPAM RBC
Blood Product Expiration Date: 202111182359
ISSUE DATE / TIME: 202110221831
Unit Type and Rh: 6200

## 2020-01-26 LAB — GLUCOSE, CAPILLARY
Glucose-Capillary: 124 mg/dL — ABNORMAL HIGH (ref 70–99)
Glucose-Capillary: 118 mg/dL — ABNORMAL HIGH (ref 70–99)
Glucose-Capillary: 217 mg/dL — ABNORMAL HIGH (ref 70–99)

## 2020-01-26 LAB — TYPE AND SCREEN: Antibody Screen: NEGATIVE

## 2020-01-26 LAB — HIV ANTIBODY (ROUTINE TESTING W REFLEX): HIV Screen 4th Generation wRfx: NONREACTIVE

## 2020-01-26 IMAGING — DX DG KNEE 3 VIEWS*R*
5 series · 5 of 5 positions shown · non-contrast
Comparison: None.

CLINICAL DATA: Fall

EXAM:
RIGHT KNEE - 3 VIEW

[knee ap (1 of 2)]
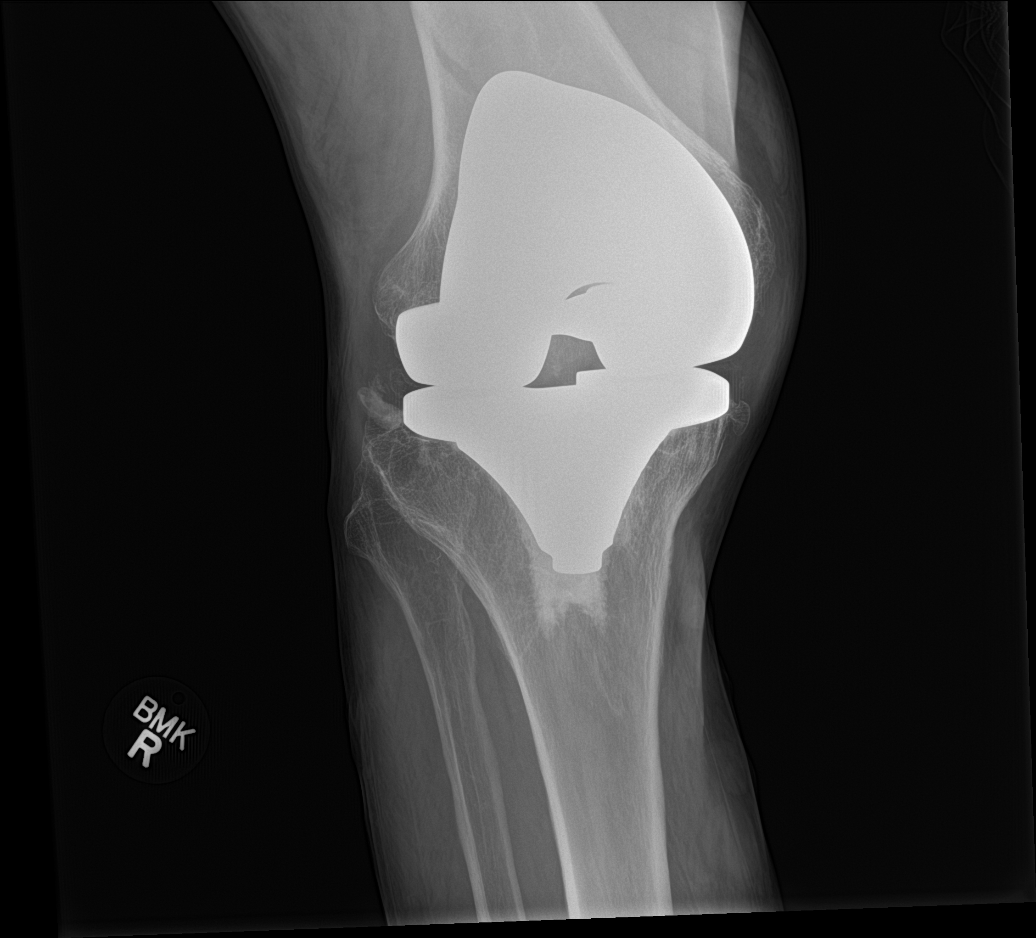

[knee tunnel]
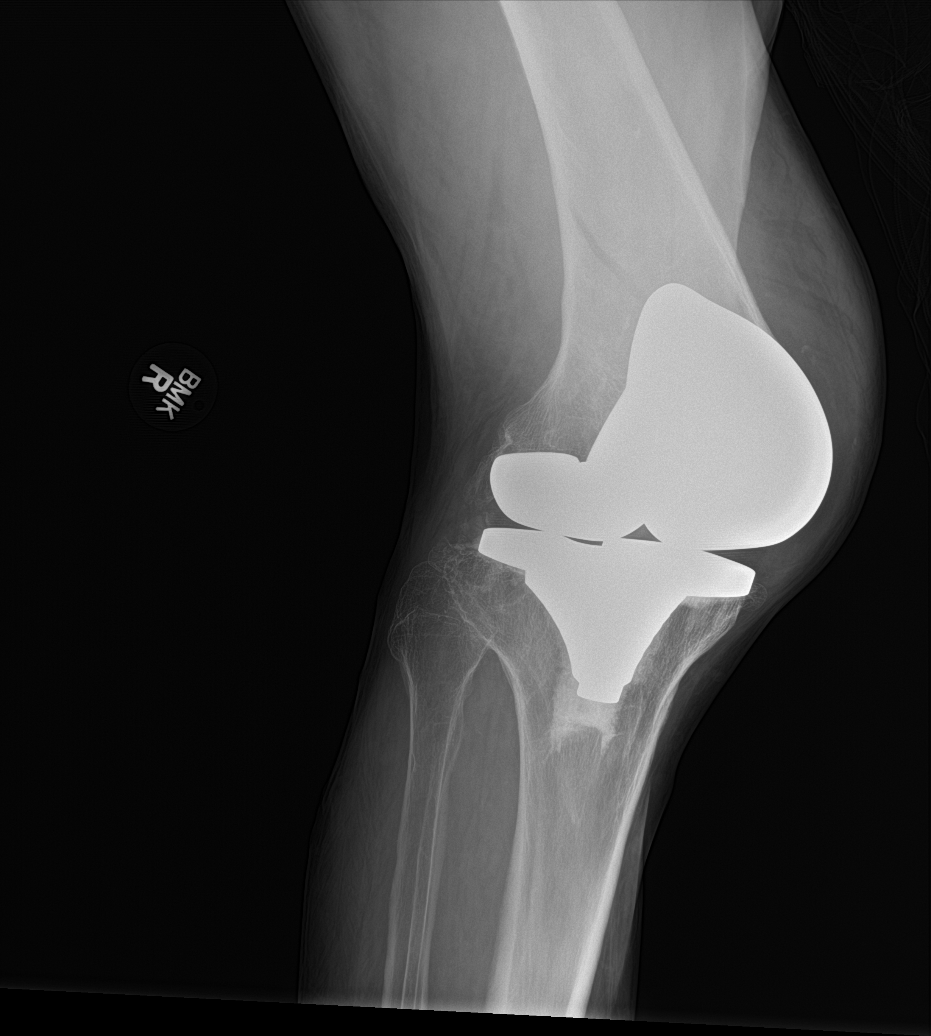

[knee lat (1 of 2)]
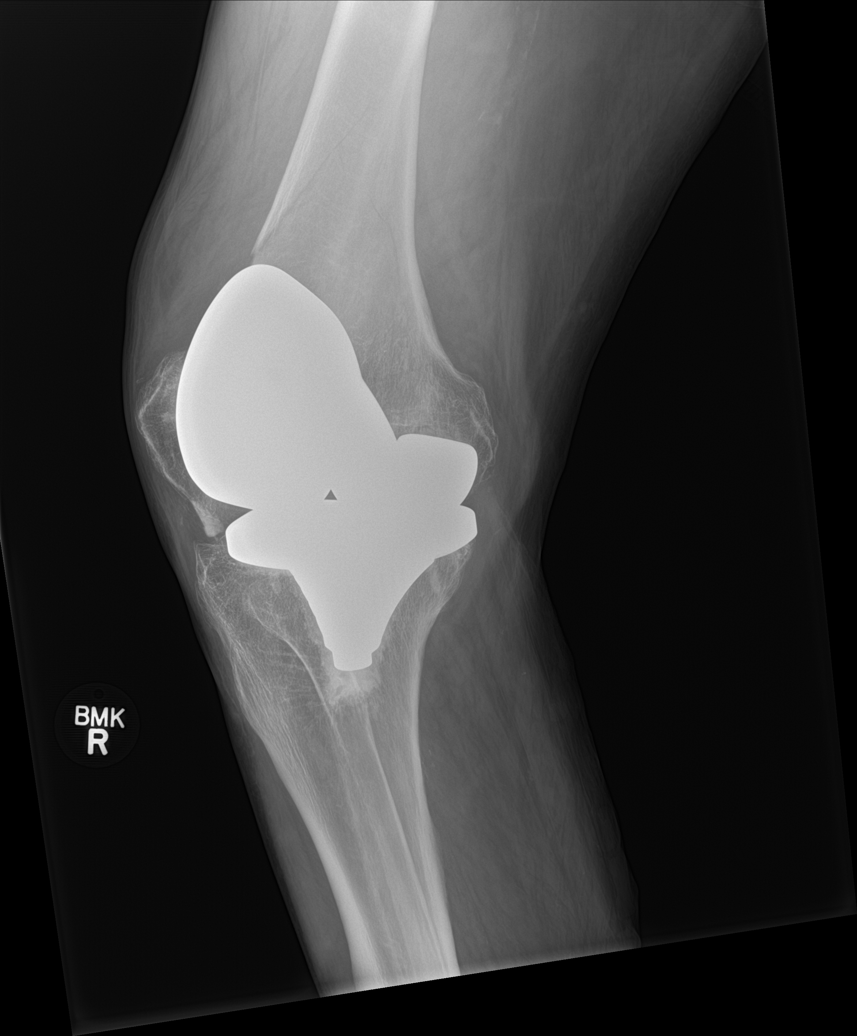

[knee ap (2 of 2)]
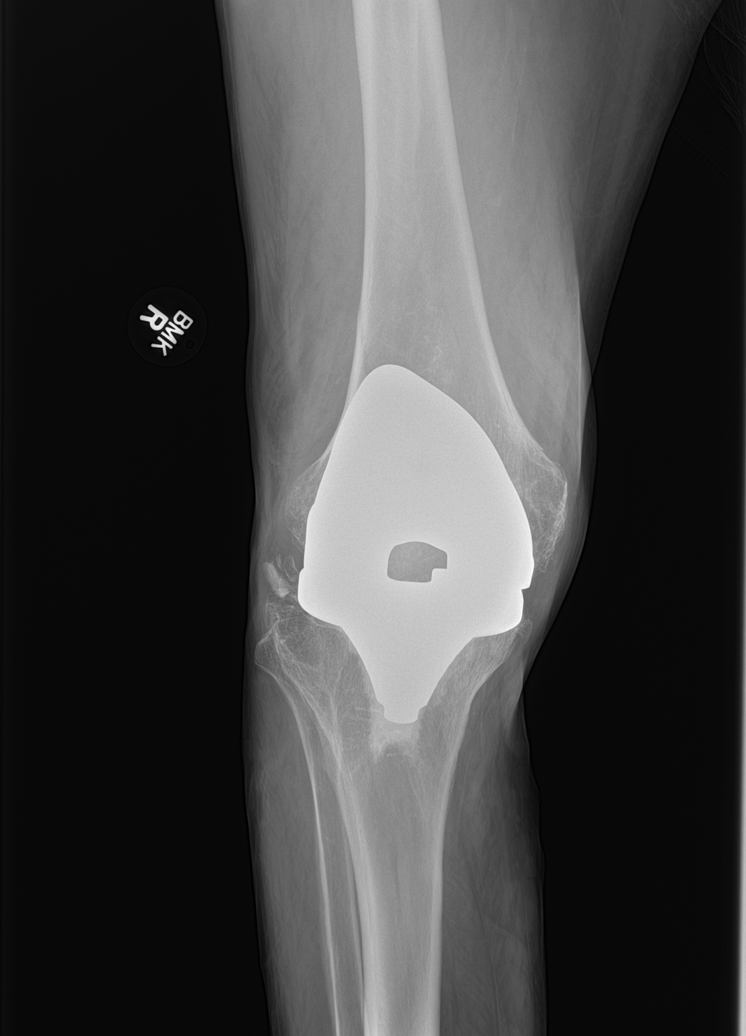

[knee lat (2 of 2)]
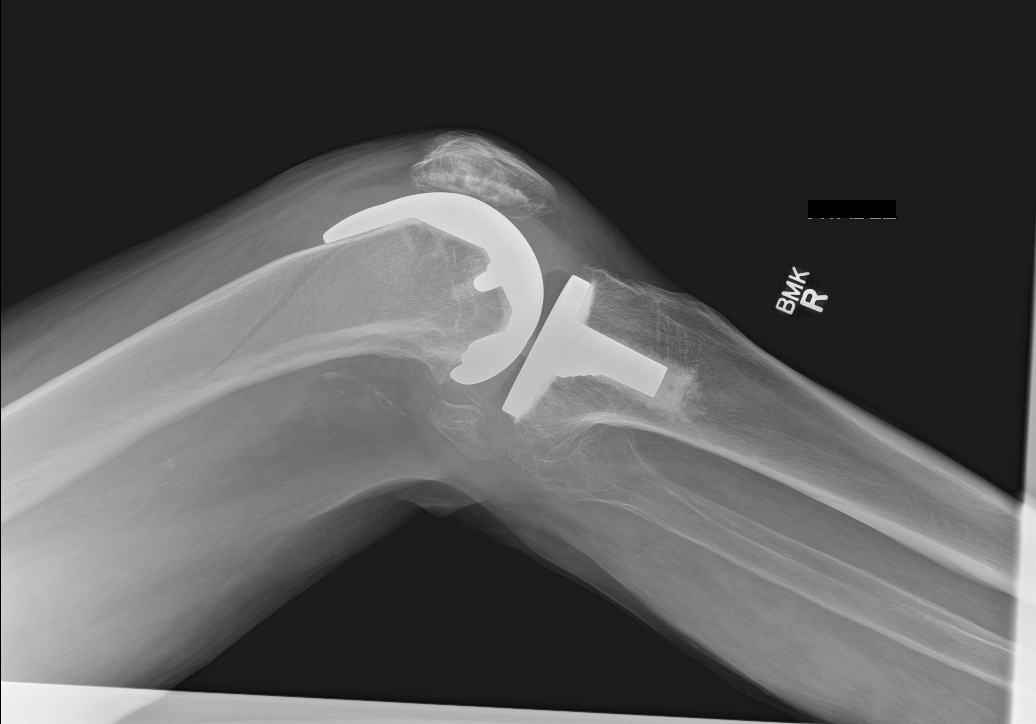

[5 of 5 positions shown; findings below may reference images not displayed]

FINDINGS: There is an acute, nondisplaced periprosthetic fracture of the
distal right femur that extends to the superior aspect of the
femoral knee arthroplasty component. Small effusion.
IMPRESSION: Acute, nondisplaced periprosthetic fracture of the distal right
femur.

## 2020-01-26 MED ORDER — POLYETHYLENE GLYCOL 3350 17 GM/SCOOP PO POWD
1.0000 | Freq: Once | ORAL | Status: AC
  Administered 2020-01-26: 255 g via ORAL
  Filled 2020-01-26 (×2): qty 255

## 2020-01-26 MED ORDER — SODIUM CHLORIDE 0.9 % IV SOLN
80.0000 mg | Freq: Once | INTRAVENOUS | Status: AC
  Administered 2020-01-26: 80 mg via INTRAVENOUS
  Filled 2020-01-26: qty 80

## 2020-01-26 MED ORDER — OCTREOTIDE LOAD VIA INFUSION
50.0000 ug | Freq: Once | INTRAVENOUS | Status: AC
  Administered 2020-01-26: 50 ug via INTRAVENOUS
  Filled 2020-01-26: qty 25

## 2020-01-26 MED ORDER — VITAMIN B-12 1000 MCG PO TABS
1000.0000 ug | ORAL_TABLET | Freq: Every day | ORAL | Status: DC
  Administered 2020-01-30 – 2020-02-06 (×7): 1000 ug via ORAL
  Filled 2020-01-26 (×8): qty 1

## 2020-01-26 MED ORDER — INSULIN ASPART 100 UNIT/ML ~~LOC~~ SOLN
0.0000 [IU] | Freq: Every day | SUBCUTANEOUS | Status: DC
  Administered 2020-01-26: 1 [IU] via SUBCUTANEOUS
  Filled 2020-01-26: qty 1

## 2020-01-26 MED ORDER — INSULIN ASPART 100 UNIT/ML ~~LOC~~ SOLN
0.0000 [IU] | Freq: Three times a day (TID) | SUBCUTANEOUS | Status: DC
  Administered 2020-01-27: 1 [IU] via SUBCUTANEOUS
  Administered 2020-01-27: 2 [IU] via SUBCUTANEOUS
  Filled 2020-01-26 (×2): qty 1

## 2020-01-26 MED ORDER — SODIUM CHLORIDE 0.9 % IV SOLN
8.0000 mg/h | INTRAVENOUS | Status: DC
  Administered 2020-01-26 – 2020-01-27 (×4): 8 mg/h via INTRAVENOUS
  Filled 2020-01-26 (×4): qty 80

## 2020-01-26 MED ORDER — SODIUM CHLORIDE 0.9 % IV SOLN
INTRAVENOUS | Status: DC
  Administered 2020-01-28: 20 mL/h via INTRAVENOUS
  Filled 2020-01-26: qty 1000

## 2020-01-26 MED ORDER — SODIUM CHLORIDE 0.9 % IV SOLN
50.0000 ug/h | INTRAVENOUS | Status: DC
  Administered 2020-01-26 – 2020-01-27 (×2): 50 ug/h via INTRAVENOUS
  Filled 2020-01-26 (×11): qty 1

## 2020-01-26 MED ORDER — MAGNESIUM CITRATE PO SOLN
1.0000 | Freq: Once | ORAL | Status: AC
  Administered 2020-01-26: 1 via ORAL
  Filled 2020-01-26: qty 296

## 2020-01-26 MED ORDER — FOLIC ACID 1 MG PO TABS
1.0000 mg | ORAL_TABLET | Freq: Every day | ORAL | Status: DC
  Administered 2020-01-30 – 2020-02-06 (×7): 1 mg via ORAL
  Filled 2020-01-26 (×8): qty 1

## 2020-01-26 NOTE — ED Notes (Signed)
Patient cleaned of incontinence, linens changed. 

## 2020-01-26 NOTE — Consult Note (Signed)
Cephas Darby, MD [address]  [place]  [place], [place] [address]  Main: [phone]  Fax: [phone] Pager: [phone]   Consultation  Referring Provider:     No ref. provider found Primary Care Physician:  Maryland Pink, MD Primary Gastroenterologist: Althia Forts         Reason for Consultation:     Hematochezia  Date of Admission:  01/25/2020 Date of Consultation:  01/26/2020         HPI:   James Sanford is a 64 y.o. male with history of heavy alcohol use, paroxysmal atrial fibrillation on Eliquis, cardiomyopathy, EF of 40%, chronic thrombocytopenia presented with severe symptomatic anemia, several episodes of hematochezia, dark red blood to black tarry bowel movements started since yesterday.  Patient reports that his last dose of Eliquis was on Thursday this week.  Patient's wife is bedside who is concerned about his alcoholism.  His last drink was 2 days ago only.  Patient reports that he takes several supplements.  He denies abdominal pain, nausea or vomiting.  He was feeling lightheaded and confused during the event.  He does have history of B12 deficiency.  In the ER, he was hypoxic to 82% on room air, normotensive, hemoglobin dropped from 11.2 in 02/2021 8.7 on arrival to the ER.  Patient is started on pantoprazole drip, underwent CT angio which did not reveal active source of bleeding, kept n.p.o. and GI is consulted for further evaluation.  His hemoglobin is 7.7 today, normal BUN/creatinine, total bilirubin 2.1, albumin 2.7.  When interviewed the patient in the ER, his wife is bedside.  Patient reports feeling better.  He is asking if he can have some clears.  He denies any abdominal pain, nausea, vomiting, hematemesis or coffee-ground emesis.  He denies any swelling of legs.  His wife mentioned about chronic diarrhea that he has been experiencing for at least 5 years which was nonbloody.  Patient was urged by his PCP to see a gastroenterologist but he  postponed. Patient had gastric sleeve approximately 10 years ago  NSAIDs: None  Antiplts/Anticoagulants/Anti thrombotics: Eliquis for history of paroxysmal A. fib  GI Procedures: None Patient denies family history of GI malignancy  Past Medical History:  Diagnosis Date  . Arthritis   . Diabetes ([place])   . Hyperlipemia   . Hypertension   . Sleep apnea     Past Surgical History:  Procedure Laterality Date  . GASTRIC BYPASS    . HERNIA REPAIR    . JOINT REPLACEMENT Bilateral    Knee surgery  . OPEN REDUCTION INTERNAL FIXATION (ORIF) DISTAL RADIAL FRACTURE Right 10/30/2017   Procedure: OPEN REDUCTION INTERNAL FIXATION (ORIF) DISTAL RADIAL FRACTURE;  Surgeon: Creig Hines, MD;  Location: [facility] ORS;  Service: Orthopedics;  Laterality: Right;    Current Facility-Administered Medications:  .  0.9 %  sodium chloride infusion, , Intravenous, Continuous, , Tally Due, MD .  dextrose 5 % in lactated ringers infusion, , Intravenous, Continuous, Garba, Mohammad L, MD, Last Rate: 100 mL/hr at 01/25/20 2255, New Bag at 01/25/20 2255 .  insulin aspart (novoLOG) injection 0-5 Units, 0-5 Units, Subcutaneous, QHS, Garba, Mohammad L, MD .  insulin aspart (novoLOG) injection 0-9 Units, 0-9 Units, Subcutaneous, TID WC, Garba, Mohammad L, MD .  LORazepam (ATIVAN) tablet 1-4 mg, 1-4 mg, Oral, Q1H PRN **OR** LORazepam (ATIVAN) injection 1-4 mg, 1-4 mg, Intravenous, Q1H PRN, Sharion Settler, NP .  magnesium citrate solution 1 Bottle, 1 Bottle, Oral, Once, ,   Reece Levy, MD .  morphine 2 MG/ML injection 2 mg, 2 mg, Intravenous, Q4H PRN, Sharion Settler, NP .  octreotide (SANDOSTATIN) 2 mcg/mL load via infusion 50 mcg, 50 mcg, Intravenous, Once **AND** octreotide (SANDOSTATIN) 500 mcg in sodium chloride 0.9 % 250 mL (2 mcg/mL) infusion, 50 mcg/hr, Intravenous, Continuous, Tam Delisle, Tally Due, MD .  ondansetron (ZOFRAN) tablet 4 mg, 4 mg, Oral, Q6H PRN **OR** ondansetron (ZOFRAN) injection 4  mg, 4 mg, Intravenous, Q6H PRN, Jonelle Sidle, Mohammad L, MD .  pantoprazole (PROTONIX) 80 mg in sodium chloride 0.9 % 100 mL (0.8 mg/mL) infusion, 8 mg/hr, Intravenous, Continuous, Merlyn Lot, MD, Stopped at 01/26/20 0417 .  pantoprazole (PROTONIX) 80 mg in sodium chloride 0.9 % 100 mL (0.8 mg/mL) infusion, 8 mg/hr, Intravenous, Continuous, Garba, Mohammad L, MD, Last Rate: 10 mL/hr at 01/26/20 0618, 8 mg/hr at 01/26/20 0618 .  [START ON 01/29/2020] pantoprazole (PROTONIX) injection 40 mg, 40 mg, Intravenous, Q12H, Garba, Mohammad L, MD .  polyethylene glycol powder (GLYCOLAX/MIRALAX) container 255 g, 1 Container, Oral, Once, Crockett Rallo, Tally Due, MD .  thiamine tablet 100 mg, 100 mg, Oral, Daily, 100 mg at 01/26/20 1112 **OR** thiamine (B-1) injection 100 mg, 100 mg, Intravenous, Daily, Sharion Settler, NP  Current Outpatient Medications:  .  B Complex-C (B-COMPLEX WITH VITAMIN C) tablet, Take 1 tablet by mouth daily., Disp: , Rfl:  .  ELIQUIS 5 MG TABS tablet, Take 5 mg by mouth 2 (two) times daily., Disp: , Rfl:  .  Garlic 7680 MG CAPS, Take 1,200 mg by mouth daily., Disp: , Rfl:  .  lisinopril (ZESTRIL) 5 MG tablet, Take 5 mg by mouth daily., Disp: , Rfl:  .  metoprolol succinate (TOPROL-XL) 50 MG 24 hr tablet, Take 50 mg by mouth daily., Disp: , Rfl:  .  Multiple Vitamin (MULTI-VITAMIN) tablet, Take 1 tablet by mouth daily., Disp: , Rfl:  .  Omega-3 Fatty Acids (FISH OIL) 1000 MG CAPS, Take 1,000 mg by mouth daily., Disp: , Rfl:  .  escitalopram (LEXAPRO) 10 MG tablet, Take 10 mg by mouth daily. (Patient not taking: Reported on 01/25/2020), Disp: , Rfl:    Family History  Problem Relation Age of Onset  . Stroke Mother   . Suicidality Father      Social History   Tobacco Use  . Smoking status: Never Smoker  . Smokeless tobacco: Former Systems developer    Types: Chew  . Tobacco comment: [employer] about 4 years ago  Substance Use Topics  . Alcohol use: Yes    Comment: beer occasionally  . Drug  use: Not Currently    Allergies as of 01/25/2020 - Review Complete 01/25/2020  Allergen Reaction Noted  . Penicillins  01/25/2020    Review of Systems:    All systems reviewed and negative except where noted in HPI.   Physical Exam:  Vital signs in last 24 hours: Temp:  [98 F (36.7 C)-99.7 F (37.6 C)] 98 F (36.7 C) (10/23 1350) Pulse Rate:  [74-99] 89 (10/23 1350) Resp:  [10-22] 22 (10/23 1130) BP: (95-127)/(67-91) 124/81 (10/23 1350) SpO2:  [82 %-100 %] 100 % (10/23 1350) Weight:  [81.6 kg] 81.6 kg (10/22 1526)   General:   Pleasant, cooperative in NAD, looks older for his age Head:  Normocephalic and atraumatic. Eyes:   No icterus.   Conjunctiva pink. PERRLA. Ears:  Normal auditory acuity. Neck:  Supple; no masses or thyroidomegaly Lungs: Respirations even and unlabored. Lungs clear to auscultation bilaterally.   No wheezes, crackles,  or rhonchi.  Heart:  Regular rate and rhythm;  Without murmur, clicks, rubs or gallops Abdomen:  Soft, nondistended, nontender. Normal bowel sounds. No appreciable masses or hepatomegaly.  No rebound or guarding.  Rectal:  Not performed. Msk:  Symmetrical without gross deformities.  Strength generalized weakness Extremities:  Without edema, cyanosis or clubbing. Neurologic:  Alert and oriented x3;  grossly normal neurologically. Skin:  Intact without significant lesions or rashes. Psych:  Alert and cooperative. Normal affect.  LAB RESULTS: CBC Latest Ref Rng & Units 01/26/2020 01/26/2020 01/25/2020  WBC 4.0 - 10.5 K/uL 6.9 7.3 8.5  Hemoglobin 13.0 - 17.0 g/dL 7.7(L) 7.7(L) 8.4(L)  Hematocrit 39 - 52 % 23.4(L) 23.3(L) 25.0(L)  Platelets 150 - 400 K/uL 95(L) 101(L) 114(L)    BMET BMP Latest Ref Rng & Units 01/26/2020 01/25/2020 10/31/2017  Glucose 70 - 99 mg/dL 156(H) 147(H) 101(H)  BUN 8 - 23 mg/dL _0 Creatinine 0.61 - 1.24 mg/dL 0.67 0.55(L) 0.41(L)  Sodium 135 - 145 mmol/L 140 139 140  Potassium 3.5 - 5.1 mmol/L 4.0 4.6  3.9  Chloride 98 - 111 mmol/L 111 109 104  CO2 22 - 32 mmol/L _1 Calcium 8.9 - 10.3 mg/dL 7.7(L) 8.0(L) 8.0(L)    LFT Hepatic Function Latest Ref Rng & Units 01/26/2020 01/25/2020 10/29/2017  Total Protein 6.5 - 8.1 g/dL 5.0(L) 5.5(L) 7.1  Albumin 3.5 - 5.0 g/dL 2.7(L) 3.0(L) 4.1  AST 15 - 41 U/L 18 22 38  ALT 0 - 44 U/L _2 Alk Phosphatase 38 - 126 U/L 83 94 94  Total Bilirubin 0.3 - 1.2 mg/dL 2.1(H) 2.1(H) 1.1     STUDIES: DG Knee 3 Views Right  Result Date: 01/26/2020 CLINICAL DATA:  Fall EXAM: RIGHT KNEE - 3 VIEW COMPARISON:  None. FINDINGS: There is an acute, nondisplaced periprosthetic fracture of the distal right femur that extends to the superior aspect of the femoral knee arthroplasty component. Small effusion. IMPRESSION: Acute, nondisplaced periprosthetic fracture of the distal right femur. Electronically Signed   By: Ulyses Jarred M.D.   On: 01/26/2020 01:24   CT Angio Abd/Pel W and/or Wo Contrast  Result Date: 01/25/2020 CLINICAL DATA:  Pelvic pain and GI bleeding, initial encounter EXAM: CTA ABDOMEN AND PELVIS WITHOUT AND WITH CONTRAST TECHNIQUE: Multidetector CT imaging of the abdomen and pelvis was performed using the standard protocol during bolus administration of intravenous contrast. Multiplanar reconstructed images and MIPs were obtained and reviewed to evaluate the vascular anatomy. CONTRAST:  157m OMNIPAQUE IOHEXOL 350 MG/ML SOLN COMPARISON:  None. FINDINGS: VASCULAR Aorta: Abdominal aorta demonstrates diffuse atherosclerotic calcifications. No aneurysmal dilatation or dissection is noted. Celiac: Atherosclerotic calcifications are noted although no focal stenosis is seen. SMA: Atherosclerotic calcifications are noted although no focal stenosis is noted. Renals: Dual renal arteries on the right with single renal artery on the left. Atherosclerotic changes are seen. No focal stenosis is noted. IMA: Patent without evidence of aneurysm, dissection,  vasculitis or significant stenosis. Iliacs: Atherosclerotic calcifications are noted without aneurysmal dilatation or dissection. Veins: No specific venous abnormality is noted. Review of the MIP images confirms the above findings. NON-VASCULAR Lower chest: Emphysematous changes are noted. No focal infiltrate or effusion is seen. Hepatobiliary: Mild fatty infiltration of the liver is noted. The gallbladder is decompressed. Pancreas: Unremarkable. No pancreatic ductal dilatation or surrounding inflammatory changes. Spleen: Normal in size without focal abnormality. Adrenals/Urinary Tract: Adrenal glands are within normal limits. Kidneys demonstrate a normal enhancement pattern bilaterally.  The bladder is partially distended. Stomach/Bowel: Colon shows diverticular change without evidence of diverticulitis. No obstructive or inflammatory changes are seen. Small bowel and stomach appear within normal limits. There are no areas of extravasation of contrast material to suggest acute GI hemorrhage. Postsurgical changes consistent with gastric bypass are seen. Lymphatic: No specific lymphadenopathy is noted. Reproductive: Prostate is unremarkable. Other: No abdominal wall hernia or abnormality. No abdominopelvic ascites. Musculoskeletal: Degenerative changes of lumbar spine are noted. IMPRESSION: VASCULAR Atherosclerotic changes are noted. No dissection or aneurysmal dilatation is seen. No findings to suggest active GI hemorrhage are seen. NON-VASCULAR Diverticulosis without diverticulitis. Fatty liver. Aortic Atherosclerosis (ICD10-I70.0) and Emphysema (ICD10-J43.9). Electronically Signed   By: Inez Catalina M.D.   On: 01/25/2020 17:39      Impression / Plan:   James Sanford is a 64 y.o. Caucasian male with paroxysmal A. fib, cardiomyopathy EF of 40%, likely alcohol induced, gastric sleeve, chronic nonbloody diarrhea, chronic thrombocytopenia presented with hematochezia  Hematochezia/melena: CT angio  negative Agree with pantoprazole drip Recommend octreotide drip given history of alcohol abuse and chronic thrombocytopenia Monitor CBC closely to maintain hemoglobin above 8, platelets greater than 50 Continue to hold Eliquis, last dose on 01/24/2020 Recommend upper endoscopy as well as colonoscopy for further evaluation Okay with clear liquid diet Bowel prep today  Chronic thrombocytopenia: Unclear etiology Differentials include secondary to alcohol abuse or ITP or hypersplenism Recommend hematology follow-up as outpatient  Chronic diarrhea: Unclear etiology Recommend colonoscopy with biopsies  Paroxysmal A. fib and cardiomyopathy, likely nonischemic secondary to alcoholism: Follow-up with cardiology Eliquis has been held in setting of active GI bleed  Thank you for involving me in the care of this patient.      LOS: 1 day   Sherri Sear, MD  01/26/2020, 2:02 PM   Note: This dictation was prepared with Dragon dictation along with smaller phrase technology. Any transcriptional errors that result from this process are unintentional.

## 2020-01-26 NOTE — Progress Notes (Signed)
Sepsis screen performed and is negative. Will continue to monitor. 

## 2020-01-26 NOTE — ED Notes (Signed)
Pt resting in bed no needs at this time. Pt reminded of male purewick for urination.

## 2020-01-26 NOTE — Progress Notes (Signed)
[facility] Progress Note  Patient: James Sanford    AVW:[id number]  DOA: 01/25/2020     Date of Service: the patient was seen and examined on 01/26/2020  Brief hospital course: Past medical history of HTN, A. fib, OSA, HLD, type II DM, alcohol abuse.  Presents with GI bleed history. Currently plan is follow-up on recommendation from GI.  Assessment and Plan: 1 painless rectal bleed:  Suspected diverticular disease.   Could also be related to alcohol abuse may be gastritis esophagitis.  Does not appear to have changes in his LFTs and no evidence of liver cirrhosis or portal hypertension.   CT abdomen pelvis did show diverticulosis without diverticulitis. Currently on IV Protonix. IVF ordered also added. Appreciate GI consultation. Further work-up depending on GI recommendation. Transfuse for hemoglobin less than 7.  2 alcohol abuse:  No evidence of DTs.  Will initiate CIWA protocol if there is sign of withdrawal.  3 diabetes: Sliding scale insulin.  Continue home regimen.  4 paroxysmal atrial fibrillation: We will hold Eliquis due to GI bleed.  Rate is controlled.  5 hyperlipidemia: Continue home regimen as much as possible.  6 essential hypertension: Hold antihypertensives in the setting of GI bleed until resolved.  7 right knee pain: Patient reported recent fall with suspected injury to the right knee.  It does not appear swollen or tender.  We will get an x-ray of the right knee to rule out any injury.  Diet: Clear liquid diet DVT Prophylaxis:   SCDs Start: 01/25/20 2119    Advance goals of care discussion: Full code  Family Communication: no family was present at bedside, at the time of interview.   Disposition:  Status is: Inpatient  Remains inpatient appropriate because:Ongoing diagnostic testing needed not appropriate for outpatient work up  Dispo: The patient is from: Home              Anticipated d/c is to: Home               Anticipated d/c date is: 2 days              Patient currently is not medically stable to d/c.   Subjective: No abdominal pain.  No nausea no vomiting or no fever no chills.  Knee pain improving.  Physical Exam:  General: Appear in mild distress, no Rash; Oral Mucosa Clear, moist. no Abnormal Neck Mass Or lumps, Conjunctiva normal  Cardiovascular: S1 and S2 Present, no Murmur, Respiratory: good respiratory effort, Bilateral Air entry present and CTA, no Crackles, no wheezes Abdomen: Bowel Sound present, Soft and no tenderness Extremities: no Pedal edema Neurology: alert and oriented to time, place, and person affect appropriate. no new focal deficit Gait not checked due to patient safety concerns  Vitals:   01/26/20 1130 01/26/20 1350 01/26/20 1624 01/26/20 1948  BP: 119/84 124/81 126/85 (!) 132/95  Pulse: 74 89 66 70  Resp: (!) 22  16 15   Temp:  98 F (36.7 C) 98.3 F (36.8 C) 97.8 F (36.6 C)  TempSrc:  Oral Oral Oral  SpO2: 100% 100% 100% 100%  Weight:      Height:        Intake/Output Summary (Last 24 hours) at 01/26/2020 2017 Last data filed at 01/26/2020 1400 Gross per 24 hour  Intake 2329.03 ml  Output --  Net 2329.03 ml   Filed Weights   01/25/20 1526  Weight: 81.6 kg    Data Reviewed: I have personally reviewed and interpreted  daily labs, tele strips, imagings as discussed above. I reviewed all nursing notes, pharmacy notes, vitals, pertinent old records I have discussed plan of care as described above with RN and patient/family.  CBC: Recent Labs  Lab 01/25/20 1543 01/25/20 1543 01/25/20 1754 01/25/20 2204 01/26/20 0421 01/26/20 1140 01/26/20 1751  WBC 10.2  --   --  8.5 7.3 6.9 7.4  NEUTROABS 9.0*  --   --  6.9  --   --   --   HGB 8.7*   < > 8.0* 8.4* 7.7* 7.7* 8.3*  HCT 25.7*   < > 23.6* 25.0* 23.3* 23.4* 25.5*  MCV 101.2*  --   --  100.4* 101.7* 102.6* 104.5*  PLT 123*  --   --  114* 101* 95* 102*   < > = values in this interval not  displayed.   Basic Metabolic Panel: Recent Labs  Lab 01/25/20 1543 01/26/20 0421  NA 139 140  K 4.6 4.0  CL 109 111  CO2 23 24  GLUCOSE 147* 156*  BUN 19 18  CREATININE 0.55* 0.67  CALCIUM 8.0* 7.7*    Studies: DG Knee 3 Views Right  Result Date: 01/26/2020 CLINICAL DATA:  Fall EXAM: RIGHT KNEE - 3 VIEW COMPARISON:  None. FINDINGS: There is an acute, nondisplaced periprosthetic fracture of the distal right femur that extends to the superior aspect of the femoral knee arthroplasty component. Small effusion. IMPRESSION: Acute, nondisplaced periprosthetic fracture of the distal right femur. Electronically Signed   By: Deatra Robinson M.D.   On: 01/26/2020 01:24    Scheduled Meds: . insulin aspart  0-5 Units Subcutaneous QHS  . insulin aspart  0-9 Units Subcutaneous TID WC  . [START ON 01/29/2020] pantoprazole  40 mg Intravenous Q12H  . thiamine  100 mg Oral Daily   Or  . thiamine  100 mg Intravenous Daily   Continuous Infusions: . sodium chloride    . dextrose 5% lactated ringers 100 mL/hr at 01/26/20 1746  . octreotide  (SANDOSTATIN)    IV infusion 50 mcg/hr (01/26/20 1749)  . pantoprozole (PROTONIX) infusion Stopped (01/26/20 0417)  . pantoprozole (PROTONIX) infusion 8 mg/hr (01/26/20 1747)   PRN Meds: LORazepam **OR** LORazepam, morphine injection, ondansetron **OR** ondansetron (ZOFRAN) IV  Time spent: 35 minutes  Author: Lynden Oxford, MD [facility] 01/26/2020 8:17 PM  To reach On-call, see care teams to locate the attending and reach out via [email or web address]. Between 7PM-7AM, please contact night-coverage If you still have difficulty reaching the attending provider, please page the [facility] (Director on Call) for [facility] on amion for assistance.

## 2020-01-26 NOTE — ED Notes (Signed)
Pt resting comfortably at this time. No distress noted. IVF infusing as well as IV protonix. Call bell in reach. Pt denies any needs at this time.

## 2020-01-26 NOTE — TOC Initial Note (Signed)
Transition of Care [facility]) - Initial/Assessment Note    Patient Details  Name: James Sanford MRN: [id number] Date of Birth: January 25, 1956  [facility] CM/SW Contact:    Larwance Rote, LCSW Phone Number: 01/26/2020, 12:09 PM  Clinical Narrative:       The patient is a 64 year old Caucasian male who presented to [facility] ED after a fall in his home.  Patient lives with his wife who was present at patient's bedside during this consultation. Patient reluctant to discuss substance abuse issues.  Noted that he is tired.  Pt's wife reported that she would like the patient to get help for substance abuse.  She reported that she woke up at about 0400 and found the patient passed out next to the bed.  She reported that "I realized he was drunk."  Stated that the patient often tells her that he wishes to die. Patient's father committed suicide and his sister is schizophrenic.  Wife noted that "I know he is depress and he is also suicidal." TOC explained to the patient's wife that the patient is going to have a psychiatric evaluation today.  Patient express desire to go home after his hospital stay. Shared with patient that a [facility] social worker will follow him during his hospital stay.         Expected Discharge Plan: Home/Self Care Barriers to Discharge: Continued Medical Work up   Patient Goals and CMS Choice Patient states their goals for this hospitalization and ongoing recovery are:: "I am going home"     Expected Discharge Plan and Services Expected Discharge Plan: Home/Self Care In-house Referral: Clinical Social Work     Living arrangements for the past 2 months: Single Family Home                     Prior Living Arrangements/Services Living arrangements for the past 2 months: Single Family Home Lives with:: Spouse Patient language and need for interpreter reviewed:: No Do you feel safe going back to the place where you live?: Yes      Need for Family  Participation in Patient Care: No (Comment) Care giver support system in place?: Yes (comment)   Criminal Activity/Legal Involvement Pertinent to Current Situation/Hospitalization: No - Comment as needed  Activities of Daily Living      Permission Sought/Granted Permission sought to share information with : Case Manager, Family Supports Permission granted to share information with : Yes, Verbal Permission Granted  Share Information with NAME: Dani Gobble) [phone]           Emotional Assessment Appearance:: Appears older than stated age Attitude/Demeanor/Rapport: Avoidant, Guarded   Orientation: : Oriented to Self, Oriented to Place, Oriented to  Time, Oriented to Situation Alcohol / Substance Use: Alcohol Use (Daily drinker.  Starts drinking at after work until passed out or intoxicated) Psych Involvement: Yes (comment) (see assessment)  Admission diagnosis:  GI bleed [K92.2] Patient Active Problem List   Diagnosis Date Noted  . Hyperlipemia   . GI bleed 01/25/2020  . Diabetes mellitus type 2, uncomplicated (HCC) 01/25/2020  . Hyperlipidemia 01/25/2020  . Obesity 01/25/2020  . Hypertension 01/25/2020  . Sleep apnea 01/25/2020  . Alcohol abuse 03/07/2019  . Cardiomyopathy, idiopathic (HCC) 03/07/2019  . Paroxysmal A-fib (HCC) 11/07/2017  . Radius fracture 10/29/2017   PCP:  Jerl Mina, MD Pharmacy:   [facility] [phone] - [place], [place] - [address] [phone]  [place] [address] Phone: [phone] Fax: [phone]     Social Determinants of Health (SDOH) Interventions    Readmission Risk Interventions No flowsheet data found.

## 2020-01-26 NOTE — ED Notes (Signed)
BS 124

## 2020-01-26 NOTE — ED Notes (Signed)
Dr. Patel at bedside 

## 2020-01-26 NOTE — ED Notes (Signed)
Report called to floor RN

## 2020-01-26 NOTE — ED Notes (Signed)
PT alert, resting in bed with no complaints at this time. Call light within reach.

## 2020-01-27 ENCOUNTER — Inpatient Hospital Stay: Payer: BC Managed Care – PPO | Admitting: Certified Registered"

## 2020-01-27 ENCOUNTER — Encounter: Payer: Self-pay | Admitting: Internal Medicine

## 2020-01-27 DIAGNOSIS — K625 Hemorrhage of anus and rectum: Secondary | ICD-10-CM | POA: Diagnosis not present

## 2020-01-27 DIAGNOSIS — K922 Gastrointestinal hemorrhage, unspecified: Secondary | ICD-10-CM | POA: Diagnosis not present

## 2020-01-27 LAB — HEMOGLOBIN AND HEMATOCRIT, BLOOD
HCT: 23.7 % — ABNORMAL LOW (ref 39.0–52.0)
Hemoglobin: 7.7 g/dL — ABNORMAL LOW (ref 13.0–17.0)

## 2020-01-27 LAB — CBC WITH DIFFERENTIAL/PLATELET
Abs Immature Granulocytes: 0.01 10*3/uL (ref 0.00–0.07)
Abs Immature Granulocytes: 0.02 10*3/uL (ref 0.00–0.07)
Basophils Absolute: 0 10*3/uL (ref 0.0–0.1)
Basophils Absolute: 0 10*3/uL (ref 0.0–0.1)
Basophils Relative: 0 %
Basophils Relative: 0 %
Eosinophils Absolute: 0 10*3/uL (ref 0.0–0.5)
Eosinophils Absolute: 0.1 10*3/uL (ref 0.0–0.5)
Eosinophils Relative: 1 %
Eosinophils Relative: 1 %
HCT: 23.1 % — ABNORMAL LOW (ref 39.0–52.0)
HCT: 24.4 % — ABNORMAL LOW (ref 39.0–52.0)
Hemoglobin: 7.7 g/dL — ABNORMAL LOW (ref 13.0–17.0)
Hemoglobin: 8 g/dL — ABNORMAL LOW (ref 13.0–17.0)
Immature Granulocytes: 0 %
Immature Granulocytes: 0 %
Lymphocytes Relative: 14 %
Lymphocytes Relative: 14 %
Lymphs Abs: 0.7 10*3/uL (ref 0.7–4.0)
Lymphs Abs: 1.2 10*3/uL (ref 0.7–4.0)
MCH: 34.1 pg — ABNORMAL HIGH (ref 26.0–34.0)
MCH: 34.3 pg — ABNORMAL HIGH (ref 26.0–34.0)
MCHC: 33.3 g/dL (ref 30.0–36.0)
MCHC: 32.8 g/dL (ref 30.0–36.0)
MCV: 102.2 fL — ABNORMAL HIGH (ref 80.0–100.0)
MCV: 104.7 fL — ABNORMAL HIGH (ref 80.0–100.0)
Monocytes Absolute: 0.4 10*3/uL (ref 0.1–1.0)
Monocytes Absolute: 0.7 10*3/uL (ref 0.1–1.0)
Monocytes Relative: 8 %
Monocytes Relative: 8 %
Neutro Abs: 3.8 10*3/uL (ref 1.7–7.7)
Neutro Abs: 6.4 10*3/uL (ref 1.7–7.7)
Neutrophils Relative %: 77 %
Neutrophils Relative %: 77 %
Platelets: 85 10*3/uL — ABNORMAL LOW (ref 150–400)
Platelets: 104 10*3/uL — ABNORMAL LOW (ref 150–400)
RBC: 2.26 MIL/uL — ABNORMAL LOW (ref 4.22–5.81)
RBC: 2.33 MIL/uL — ABNORMAL LOW (ref 4.22–5.81)
RDW: 13.8 % (ref 11.5–15.5)
RDW: 13.4 % (ref 11.5–15.5)
WBC: 4.9 10*3/uL (ref 4.0–10.5)
WBC: 8.3 10*3/uL (ref 4.0–10.5)
nRBC: 0 % (ref 0.0–0.2)
nRBC: 0 % (ref 0.0–0.2)

## 2020-01-27 LAB — COMPREHENSIVE METABOLIC PANEL
ALT: 15 U/L (ref 0–44)
AST: 20 U/L (ref 15–41)
Albumin: 2.6 g/dL — ABNORMAL LOW (ref 3.5–5.0)
Alkaline Phosphatase: 81 U/L (ref 38–126)
Anion gap: 5 (ref 5–15)
BUN: 9 mg/dL (ref 8–23)
CO2: 26 mmol/L (ref 22–32)
Calcium: 7.5 mg/dL — ABNORMAL LOW (ref 8.9–10.3)
Chloride: 108 mmol/L (ref 98–111)
Creatinine, Ser: 0.72 mg/dL (ref 0.61–1.24)
GFR, Estimated: >60 mL/min (ref >60)
Glucose, Bld: 138 mg/dL — ABNORMAL HIGH (ref 70–99)
Potassium: 3.7 mmol/L (ref 3.5–5.1)
Sodium: 139 mmol/L (ref 135–145)
Total Bilirubin: 1.4 mg/dL — ABNORMAL HIGH (ref 0.3–1.2)
Total Protein: 4.9 g/dL — ABNORMAL LOW (ref 6.5–8.1)

## 2020-01-27 LAB — GLUCOSE, CAPILLARY
Glucose-Capillary: 139 mg/dL — ABNORMAL HIGH (ref 70–99)
Glucose-Capillary: 151 mg/dL — ABNORMAL HIGH (ref 70–99)
Glucose-Capillary: 77 mg/dL (ref 70–99)

## 2020-01-27 LAB — PROTIME-INR
INR: 1.2 (ref 0.8–1.2)
Prothrombin Time: 14.8 seconds (ref 11.4–15.2)

## 2020-01-27 LAB — MAGNESIUM: Magnesium: 2.5 mg/dL — ABNORMAL HIGH (ref 1.7–2.4)

## 2020-01-27 LAB — VITAMIN B12: Vitamin B-12: 338 pg/mL (ref 180–914)

## 2020-01-27 MED ORDER — VASOPRESSIN 20 UNIT/ML IV SOLN
INTRAVENOUS | Status: DC | PRN
  Administered 2020-01-27 (×2): 1 [IU] via INTRAVENOUS

## 2020-01-27 MED ORDER — SODIUM CHLORIDE 0.9 % IV SOLN: INTRAVENOUS | Status: DC

## 2020-01-27 MED ORDER — PROPOFOL 500 MG/50ML IV EMUL
INTRAVENOUS | Status: DC | PRN
  Administered 2020-01-27: 145 ug/kg/min via INTRAVENOUS

## 2020-01-27 MED ORDER — EPHEDRINE SULFATE 50 MG/ML IJ SOLN
INTRAMUSCULAR | Status: DC | PRN
  Administered 2020-01-27: 5 mg via INTRAVENOUS

## 2020-01-27 MED ORDER — GLYCOPYRROLATE 0.2 MG/ML IJ SOLN
INTRAMUSCULAR | Status: DC | PRN
  Administered 2020-01-27: .2 mg via INTRAVENOUS

## 2020-01-27 MED ORDER — PHENYLEPHRINE HCL (PRESSORS) 10 MG/ML IV SOLN
INTRAVENOUS | Status: DC | PRN
  Administered 2020-01-27 (×4): 100 ug via INTRAVENOUS
  Administered 2020-01-27: 200 ug via INTRAVENOUS
  Administered 2020-01-27: 100 ug via INTRAVENOUS
  Administered 2020-01-27: 200 ug via INTRAVENOUS

## 2020-01-27 MED ORDER — PROPOFOL 10 MG/ML IV BOLUS
INTRAVENOUS | Status: DC | PRN
  Administered 2020-01-27: 50 mg via INTRAVENOUS
  Administered 2020-01-27 (×2): 10 mg via INTRAVENOUS

## 2020-01-27 MED ORDER — POLYETHYLENE GLYCOL 3350 17 GM/SCOOP PO POWD
1.0000 | Freq: Once | ORAL | Status: AC
  Administered 2020-01-27: 255 g via ORAL
  Filled 2020-01-27: qty 255

## 2020-01-27 MED ORDER — LIDOCAINE HCL (CARDIAC) PF 100 MG/5ML IV SOSY
PREFILLED_SYRINGE | INTRAVENOUS | Status: DC | PRN
  Administered 2020-01-27: 100 mg via INTRAVENOUS

## 2020-01-27 NOTE — Anesthesia Procedure Notes (Signed)
Procedure Name: General with mask airway Performed by: Fletcher-Harrison, Jolaine Fryberger, CRNA Pre-anesthesia Checklist: Patient identified, Emergency Drugs available, Suction available and Patient being monitored Patient Re-evaluated:Patient Re-evaluated prior to induction Oxygen Delivery Method: Simple face mask Induction Type: IV induction Placement Confirmation: positive ETCO2 and CO2 detector Dental Injury: Teeth and Oropharynx as per pre-operative assessment        

## 2020-01-27 NOTE — Anesthesia Preprocedure Evaluation (Addendum)
Anesthesia Evaluation  Patient identified by MRN, date of birth, ID band Patient awake    Reviewed: Allergy & Precautions, H&P , NPO status , Patient's Chart, lab work & pertinent test results, reviewed documented beta blocker date and time   History of Anesthesia Complications Negative for: history of anesthetic complications  Airway Mallampati: II  TM Distance: >3 FB Neck ROM: full    Dental  (+) Edentulous Upper, Edentulous Lower   Pulmonary neg shortness of breath, sleep apnea (history of, but none since weight loss) , neg COPD, neg recent URI,           Cardiovascular Exercise Tolerance: Good hypertension, (-) angina(-) CAD, (-) Past MI, (-) Cardiac Stents and (-) CABG + dysrhythmias (new onset, cleared by cards) Atrial Fibrillation (-) Valvular Problems/Murmurs     Neuro/Psych negative neurological ROS  negative psych ROS   GI/Hepatic (+)     substance abuse  alcohol use,   Endo/Other  negative endocrine ROSdiabetes  Renal/GU negative Renal ROS  negative genitourinary   Musculoskeletal  (+) Arthritis ,   Abdominal   Peds  Hematology  (+) anemia ,   Anesthesia Other Findings Past Medical History: No date: Arthritis No date: Diabetes mellitus without complication (HCC) No date: Hyperlipemia No date: Hypertension No date: Sleep apnea GI bleed  Reproductive/Obstetrics negative OB ROS                            Anesthesia Physical  Anesthesia Plan  ASA: III  Anesthesia Plan: General   Post-op Pain Management:  Regional for Post-op pain   Induction: Intravenous  PONV Risk Score and Plan: Propofol infusion and TIVA  Airway Management Planned: Nasal Cannula  Additional Equipment:   Intra-op Plan:   Post-operative Plan:   Informed Consent: I have reviewed the patients History and Physical, chart, labs and discussed the procedure including the risks, benefits and  alternatives for the proposed anesthesia with the patient or authorized representative who has indicated his/her understanding and acceptance.     Dental Advisory Given  Plan Discussed with: Anesthesiologist, CRNA and Surgeon  Anesthesia Plan Comments:         Anesthesia Quick Evaluation

## 2020-01-27 NOTE — Op Note (Signed)
[facility] Gastroenterology Patient Name: James Sanford Procedure Date: 01/27/2020 10:19 AM MRN: [id number] Account #: [id number] Date of Birth: Apr 26, 1955 Admit Type: Outpatient Age: 64 Room: [facility] ENDO ROOM 1 Gender: Male Note Status: Finalized Procedure:             Colonoscopy Indications:           Hematochezia Providers:             Toney Reil MD, MD Medicines:             General Anesthesia Complications:         No immediate complications. Estimated blood loss: None. Procedure:             Pre-Anesthesia Assessment:                        - Prior to the procedure, a History and Physical was                         performed, and patient medications and allergies were                         reviewed. The patient is competent. The risks and                         benefits of the procedure and the sedation options and                         risks were discussed with the patient. All questions                         were answered and informed consent was obtained.                         Patient identification and proposed procedure were                         verified by the physician, the nurse, the                         anesthesiologist, the anesthetist and the technician                         in the pre-procedure area in the procedure room in the                         endoscopy suite. Mental Status Examination: alert and                         oriented. Airway Examination: normal oropharyngeal                         airway and neck mobility. Respiratory Examination:                         clear to auscultation. CV Examination: normal.                         Prophylactic Antibiotics: The patient does not require  prophylactic antibiotics. Prior Anticoagulants: The                         patient has taken Eliquis (apixaban), last dose was 3                         days prior to procedure. ASA Grade Assessment:  IV - A                         patient with severe systemic disease that is a                         constant threat to life. After reviewing the risks and                         benefits, the patient was deemed in satisfactory                         condition to undergo the procedure. The anesthesia                         plan was to use general anesthesia. Immediately prior                         to administration of medications, the patient was                         re-assessed for adequacy to receive sedatives. The                         heart rate, respiratory rate, oxygen saturations,                         blood pressure, adequacy of pulmonary ventilation, and                         response to care were monitored throughout the                         procedure. The physical status of the patient was                         re-assessed after the procedure.                        After obtaining informed consent, the colonoscope was                         passed under direct vision. Throughout the procedure,                         the patient's blood pressure, pulse, and oxygen                         saturations were monitored continuously. The                         Colonoscope was introduced through the anus with the  intention of advancing to the cecum. The scope was                         advanced to the descending colon before the procedure                         was aborted. Medications were given. The colonoscopy                         was extremely difficult due to poor bowel prep and                         significant looping. Successful completion of the                         procedure was aided by applying abdominal pressure.                         The patient tolerated the procedure fairly well. The                         quality of the bowel preparation was poor. Findings:      The perianal and digital rectal examinations  were normal. Pertinent       negatives include normal sphincter tone and no palpable rectal lesions.      Multiple diverticula were found in the entire colon, likely source of       hematochezia.      Old blood mixed with liquid stool was found in the entire colon.      Copious quantities of semi-liquid stool was found in the entire colon,       precluding visualization.      The retroflexed view of the distal rectum and anal verge was normal and       showed no anal or rectal abnormalities. Impression:            - Preparation of the colon was poor.                        - Diverticulosis in the entire examined colon.                        - Blood in the entire examined colon.                        - Stool in the entire examined colon.                        - The distal rectum and anal verge are normal on                         retroflexion view.                        - No specimens collected. Recommendation:        - Return patient to hospital ward for ongoing care.                        - Clear liquid diet today.                        -  Repeat colonoscopy tomorrow because the bowel                         preparation was poor. Procedure Code(s):     --- Professional ---                        [phone], 53, Colonoscopy, flexible; diagnostic,                         including collection of specimen(s) by brushing or                         washing, when performed (separate procedure) Diagnosis Code(s):     --- Professional ---                        K92.2, Gastrointestinal hemorrhage, unspecified                        K92.1, Melena (includes Hematochezia)                        K57.30, Diverticulosis of large intestine without                         perforation or abscess without bleeding CPT copyright 2019 American Medical Association. All rights reserved. The codes documented in this report are preliminary and upon coder review may  be revised to meet current compliance  requirements. Dr. Libby Maw Toney Reil MD, MD 01/27/2020 11:31:45 AM This report has been signed electronically. Number of Addenda: 0 Note Initiated On: 01/27/2020 10:19 AM Total Procedure Duration: 0 hours 18 minutes 56 seconds  Estimated Blood Loss:  Estimated blood loss: none.      [facility]

## 2020-01-27 NOTE — Plan of Care (Addendum)
Pt calm and cooperative and able to voice his needs. Pt NPO for EGD/Colonoscopy today. Pt remains on Octreotide and Protonix drip. IVF at 15ml/hr. CIWA score 0-1. Pt takes pills whole, and is one person assist to [facility]. Vitals stable. Safety measures in place. Will continue to monitor.  Problem: Education: Goal: Knowledge of General Education information will improve Description: Including pain rating scale, medication(s)/side effects and non-pharmacologic comfort measures Outcome: Progressing   Problem: Health Behavior/Discharge Planning: Goal: Ability to manage health-related needs will improve Outcome: Progressing   Problem: Clinical Measurements: Goal: Ability to maintain clinical measurements within normal limits will improve Outcome: Progressing Goal: Will remain free from infection Outcome: Progressing Goal: Diagnostic test results will improve Outcome: Progressing Goal: Respiratory complications will improve Outcome: Progressing Goal: Cardiovascular complication will be avoided Outcome: Progressing   Problem: Activity: Goal: Risk for activity intolerance will decrease Outcome: Progressing   Problem: Nutrition: Goal: Adequate nutrition will be maintained Outcome: Progressing   Problem: Coping: Goal: Level of anxiety will decrease Outcome: Progressing   Problem: Elimination: Goal: Will not experience complications related to bowel motility Outcome: Progressing Goal: Will not experience complications related to urinary retention Outcome: Progressing   Problem: Pain Managment: Goal: General experience of comfort will improve Outcome: Progressing   Problem: Safety: Goal: Ability to remain free from injury will improve Outcome: Progressing   Problem: Skin Integrity: Goal: Risk for impaired skin integrity will decrease Outcome: Progressing

## 2020-01-27 NOTE — Plan of Care (Signed)
  Problem: Education: Goal: Knowledge of General Education information will improve Description Including pain rating scale, medication(s)/side effects and non-pharmacologic comfort measures Outcome: Progressing   Problem: Health Behavior/Discharge Planning: Goal: Ability to manage health-related needs will improve Outcome: Progressing   

## 2020-01-27 NOTE — Op Note (Signed)
[facility] Gastroenterology Patient Name: James Sanford Procedure Date: 01/27/2020 10:18 AM MRN: [id number] Account #: [id number] Date of Birth: 06-15-1955 Admit Type: Outpatient Age: 64 Room: [facility] ENDO ROOM 1 Gender: Male Note Status: Finalized Procedure:             Upper GI endoscopy Indications:           Hematochezia Providers:             Toney Reil MD, MD Medicines:             General Anesthesia Complications:         No immediate complications. Estimated blood loss: None. Procedure:             Pre-Anesthesia Assessment:                        - Prior to the procedure, a History and Physical was                         performed, and patient medications and allergies were                         reviewed. The patient is competent. The risks and                         benefits of the procedure and the sedation options and                         risks were discussed with the patient. All questions                         were answered and informed consent was obtained.                         Patient identification and proposed procedure were                         verified by the physician, the nurse, the                         anesthesiologist, the anesthetist and the technician                         in the pre-procedure area in the procedure room in the                         endoscopy suite. Mental Status Examination: alert and                         oriented. Airway Examination: normal oropharyngeal                         airway and neck mobility. Respiratory Examination:                         clear to auscultation. CV Examination: normal.                         Prophylactic Antibiotics: The patient does not require  prophylactic antibiotics. Prior Anticoagulants: The                         patient has taken Eliquis (apixaban), last dose was 3                         days prior to procedure. ASA Grade  Assessment: III - A                         patient with severe systemic disease. After reviewing                         the risks and benefits, the patient was deemed in                         satisfactory condition to undergo the procedure. The                         anesthesia plan was to use general anesthesia.                         Immediately prior to administration of medications,                         the patient was re-assessed for adequacy to receive                         sedatives. The heart rate, respiratory rate, oxygen                         saturations, blood pressure, adequacy of pulmonary                         ventilation, and response to care were monitored                         throughout the procedure. The physical status of the                         patient was re-assessed after the procedure.                        After obtaining informed consent, the endoscope was                         passed under direct vision. Throughout the procedure,                         the patient's blood pressure, pulse, and oxygen                         saturations were monitored continuously. The Endoscope                         was introduced through the mouth, and advanced to the                         second part of duodenum. The upper  GI endoscopy was                         accomplished without difficulty. The patient tolerated                         the procedure well. Findings:      Evidence of a patent Billroth II gastrojejunostomy was found. The       gastrojejunal anastomosis was characterized by healthy appearing mucosa.       This was traversed. The efferent limb was examined normal appearing       mucosa. The afferent limb was examined normal appearing mucosa. Biopsies       were performed      Evidence of a patent vertical banded gastroplasty was found. A gastric       pouch with a normal size was found. The staple line appeared intact.        Evidence of a Silastic band was not seen. This was traversed. Biopsies       were taken with a cold forceps for histology.      A small hiatal hernia was present.      Esophagogastric landmarks were identified: the gastroesophageal junction       was found at 39 cm from the incisors.      The gastroesophageal junction and examined esophagus were normal. Impression:            - Patent Billroth II gastrojejunostomy was found,                         characterized by healthy appearing mucosa.                        - Patent vertical banded gastroplasty with a                         normal-sized pouch and intact staple line. Biopsied.                        - Small hiatal hernia.                        - Esophagogastric landmarks identified.                        - Normal gastroesophageal junction and esophagus. Recommendation:        - Await pathology results.                        - Proceed with colonoscopy as scheduled                        See colonoscopy report Procedure Code(s):     --- Professional ---                        [phone], Esophagogastroduodenoscopy, flexible,                         transoral; with biopsy, single or multiple Diagnosis Code(s):     --- Professional ---  Z98.0, Intestinal bypass and anastomosis status                        Z98.84, Bariatric surgery status                        K92.1, Melena (includes Hematochezia)                        K44.9, Diaphragmatic hernia without obstruction or                         gangrene CPT copyright 2019 American Medical Association. All rights reserved. The codes documented in this report are preliminary and upon coder review may  be revised to meet current compliance requirements. Dr. Libby Maw Toney Reil MD, MD 01/27/2020 10:59:11 AM This report has been signed electronically. Number of Addenda: 0 Note Initiated On: 01/27/2020 10:18 AM Estimated Blood Loss:  Estimated blood loss:  none.      [facility]

## 2020-01-27 NOTE — Progress Notes (Addendum)
[facility] Progress Note  Patient: James Sanford    ZJQ:[id number]  DOA: 01/25/2020     Date of Service: the patient was seen and examined on 01/27/2020  Brief hospital course: Past medical history of HTN, A. fib, OSA, HLD, type II DM, alcohol abuse.  Presents with GI bleed history. SP EGD.  Poor peripheral p.o. but without 4 or multiple rehab comfort again tomorrow. Currently plan is supportive measures for bleeding and awaiting results for colonoscopy.  Assessment and Plan: 1.  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 Upper GI endoscopy negative for any acute bleeding. Currently on Protonix. Octreotide was initiated but discontinued now. Appreciate GI consultation. Colonoscopy was attempted but the prep was poor therefore will be attempted again tomorrow. Monitor H&H and transfuse hemoglobin less than 7.  2.  Alcohol abuse No evidence of withdrawal for now. Continue CIWA protocol for now.  3.  Type 2 diabetes mellitus, controlled. Continue sliding scale insulin for now.  4.  Paroxysmal A. fib On Eliquis. Rate currently controlled. Currently holding anticoagulation in the setting of GI bleed.  5.  Thrombocytopenia, chronic Mild worsening right now likely in the setting of PRBC transfusion. In the setting of alcohol abuse. Monitor for now.  Supportive measures  7.  Emphysema Emphysematous changes Seen on CT scan. Likely patient has COPD.  Outpatient work-up recommended.  Previous note wrote X-ray negative for any acute abnormality. Which was an error.   Diet: No nausea no vomiting.  Clear liquid diet.  No fever no chills. DVT Prophylaxis:   SCDs Start: 01/25/20 2119    Advance goals of care discussion: Full code  Family Communication: family was present at bedside, at the time of interview.  The pt provided permission to discuss medical plan with the family. Opportunity was given to ask question and all questions were answered satisfactorily.    Disposition:  Status is: Inpatient  Remains inpatient appropriate because:Ongoing diagnostic testing needed not appropriate for outpatient work up   Dispo: The patient is from: Home              Anticipated d/c is to: Home              Anticipated d/c date is: 2 days              Patient currently is not medically stable to d/c.  Subjective: No acute complaint.  No nausea no vomiting.  No fever no chills.  Tolerating prep.  Reportedly had some blood at the end of the prep.  Physical Exam:  General: Appear in mild distress, no Rash; Oral Mucosa Clear, moist. no Abnormal Neck Mass Or lumps, Conjunctiva normal  Cardiovascular: S1 and S2 Present, no Murmur, Respiratory: good respiratory effort, Bilateral Air entry present and CTA, no Crackles, no wheezes Abdomen: Bowel Sound present, Soft and no tenderness Extremities: no Pedal edema Neurology: alert and oriented to time, place, and person affect appropriate. no new focal deficit Gait not checked due to patient safety concerns  Vitals:   01/27/20 1131 01/27/20 1141 01/27/20 1151 01/27/20 1533  BP: 112/74 114/68 120/81 107/82  Pulse: (!) 115 100 93 65  Resp: 18 20 18 16   Temp:    98.7 F (37.1 C)  TempSrc:      SpO2: 98% 99% 100% 100%  Weight:      Height:        Intake/Output Summary (Last 24 hours) at 01/27/2020 1533 Last data filed at 01/27/2020 1128 Gross per 24 hour  Intake 200  ml  Output --  Net 200 ml   Filed Weights   01/25/20 1526 01/27/20 1034  Weight: 81.6 kg 85.3 kg    Data Reviewed: I have personally reviewed and interpreted daily labs, tele strips, imagings as discussed above. I reviewed all nursing notes, pharmacy notes, vitals, pertinent old records I have discussed plan of care as described above with RN and patient/family.  CBC: Recent Labs  Lab 01/25/20 1543 01/25/20 1754 01/25/20 2204 01/25/20 2204 01/26/20 0421 01/26/20 1140 01/26/20 1751 01/27/20 0901 01/27/20 1254  WBC 10.2   < >  8.5  --  7.3 6.9 7.4 4.9  --   NEUTROABS 9.0*  --  6.9  --   --   --   --  3.8  --   HGB 8.7*   < > 8.4*   < > 7.7* 7.7* 8.3* 7.7* 7.7*  HCT 25.7*   < > 25.0*   < > 23.3* 23.4* 25.5* 23.1* 23.7*  MCV 101.2*   < > 100.4*  --  101.7* 102.6* 104.5* 102.2*  --   PLT 123*   < > 114*  --  101* 95* 102* 85*  --    < > = values in this interval not displayed.   Basic Metabolic Panel: Recent Labs  Lab 01/25/20 1543 01/26/20 0421 01/27/20 0901  NA 139 140 139  K 4.6 4.0 3.7  CL 109 111 108  CO2 23 24 26   GLUCOSE 147* 156* 138*  BUN 19 18 9   CREATININE 0.55* 0.67 0.72  CALCIUM 8.0* 7.7* 7.5*  MG  --   --  2.5*    Studies: No results found.  Scheduled Meds: . folic acid  1 mg Oral Daily  . insulin aspart  0-5 Units Subcutaneous QHS  . insulin aspart  0-9 Units Subcutaneous TID WC  . [START ON 01/29/2020] pantoprazole  40 mg Intravenous Q12H  . thiamine  100 mg Oral Daily   Or  . thiamine  100 mg Intravenous Daily  . vitamin B-12  1,000 mcg Oral Daily   Continuous Infusions: . sodium chloride    . octreotide  (SANDOSTATIN)    IV infusion 50 mcg/hr (01/27/20 0117)   PRN Meds: LORazepam **OR** LORazepam, morphine injection, ondansetron **OR** ondansetron (ZOFRAN) IV  Time spent: 35 minutes  Author: 01/31/2020, MD [facility] 01/27/2020 3:33 PM  To reach On-call, see care teams to locate the attending and reach out via [email or web address]. Between 7PM-7AM, please contact night-coverage If you still have difficulty reaching the attending provider, please page the [facility] (Director on Call) for [facility] on amion for assistance.

## 2020-01-27 NOTE — Transfer of Care (Signed)
Immediate Anesthesia Transfer of Care Note  Patient: James Sanford  Procedure(s) Performed: ESOPHAGOGASTRODUODENOSCOPY (EGD) WITH PROPOFOL (N/A ) COLONOSCOPY WITH PROPOFOL (N/A )  Patient Location: Endoscopy Unit  Anesthesia Type:General  Level of Consciousness: drowsy and responds to stimulation  Airway & Oxygen Therapy: Patient Spontanous Breathing and Patient connected to face mask oxygen  Post-op Assessment: Report given to RN and Post -op Vital signs reviewed and stable  Post vital signs: Reviewed and stable  Last Vitals:  Vitals Value Taken Time  BP    Temp    Pulse    Resp    SpO2      Last Pain:  Vitals:   01/27/20 1034  TempSrc: Temporal  PainSc: 0-No pain         Complications: No complications documented.

## 2020-01-28 ENCOUNTER — Encounter: Payer: Self-pay | Admitting: Internal Medicine

## 2020-01-28 ENCOUNTER — Encounter
Admission: EM | Disposition: A | Discharge status: Skilled Nursing Facility | Payer: Self-pay | Source: Home / Self Care | Attending: Internal Medicine

## 2020-01-28 ENCOUNTER — Inpatient Hospital Stay: Payer: BC Managed Care – PPO | Admitting: Anesthesiology

## 2020-01-28 DIAGNOSIS — K625 Hemorrhage of anus and rectum: Secondary | ICD-10-CM | POA: Diagnosis not present

## 2020-01-28 DIAGNOSIS — K922 Gastrointestinal hemorrhage, unspecified: Secondary | ICD-10-CM | POA: Diagnosis not present

## 2020-01-28 DIAGNOSIS — F32A Depression, unspecified: Secondary | ICD-10-CM

## 2020-01-28 HISTORY — PX: ESOPHAGOGASTRODUODENOSCOPY (EGD) WITH PROPOFOL: SHX5813

## 2020-01-28 HISTORY — PX: COLONOSCOPY WITH PROPOFOL: SHX5780

## 2020-01-28 LAB — CBC
HCT: 25.7 % — ABNORMAL LOW (ref 39.0–52.0)
Hemoglobin: 8.4 g/dL — ABNORMAL LOW (ref 13.0–17.0)
MCH: 34.7 pg — ABNORMAL HIGH (ref 26.0–34.0)
MCHC: 32.7 g/dL (ref 30.0–36.0)
MCV: 106.2 fL — ABNORMAL HIGH (ref 80.0–100.0)
Platelets: 103 10*3/uL — ABNORMAL LOW (ref 150–400)
RBC: 2.42 MIL/uL — ABNORMAL LOW (ref 4.22–5.81)
RDW: 13.6 % (ref 11.5–15.5)
WBC: 6.5 10*3/uL (ref 4.0–10.5)
nRBC: 0 % (ref 0.0–0.2)

## 2020-01-28 LAB — COMPREHENSIVE METABOLIC PANEL
ALT: 14 U/L (ref 0–44)
AST: 15 U/L (ref 15–41)
Albumin: 2.5 g/dL — ABNORMAL LOW (ref 3.5–5.0)
Alkaline Phosphatase: 81 U/L (ref 38–126)
Anion gap: 5 (ref 5–15)
BUN: 7 mg/dL — ABNORMAL LOW (ref 8–23)
CO2: 25 mmol/L (ref 22–32)
Calcium: 7.7 mg/dL — ABNORMAL LOW (ref 8.9–10.3)
Chloride: 110 mmol/L (ref 98–111)
Creatinine, Ser: 0.7 mg/dL (ref 0.61–1.24)
GFR, Estimated: >60 mL/min (ref >60)
Glucose, Bld: 101 mg/dL — ABNORMAL HIGH (ref 70–99)
Potassium: 3.8 mmol/L (ref 3.5–5.1)
Sodium: 140 mmol/L (ref 135–145)
Total Bilirubin: 1.2 mg/dL (ref 0.3–1.2)
Total Protein: 4.7 g/dL — ABNORMAL LOW (ref 6.5–8.1)

## 2020-01-28 LAB — HEMOGLOBIN AND HEMATOCRIT, BLOOD
HCT: 22.8 % — ABNORMAL LOW (ref 39.0–52.0)
Hemoglobin: 7.4 g/dL — ABNORMAL LOW (ref 13.0–17.0)

## 2020-01-28 LAB — GLUCOSE, CAPILLARY
Glucose-Capillary: 89 mg/dL (ref 70–99)
Glucose-Capillary: 105 mg/dL — ABNORMAL HIGH (ref 70–99)
Glucose-Capillary: 122 mg/dL — ABNORMAL HIGH (ref 70–99)

## 2020-01-28 LAB — PROTIME-INR
INR: 1.1 (ref 0.8–1.2)
Prothrombin Time: 13.3 seconds (ref 11.4–15.2)

## 2020-01-28 LAB — HEMOGLOBIN A1C
Hgb A1c MFr Bld: 4.9 % (ref 4.8–5.6)
Mean Plasma Glucose: 94 mg/dL

## 2020-01-28 LAB — MAGNESIUM: Magnesium: 2.2 mg/dL (ref 1.7–2.4)

## 2020-01-28 SURGERY — ESOPHAGOGASTRODUODENOSCOPY (EGD) WITH PROPOFOL
Anesthesia: General

## 2020-01-28 SURGERY — COLONOSCOPY WITH PROPOFOL
Anesthesia: General

## 2020-01-28 MED ORDER — PROPOFOL 500 MG/50ML IV EMUL
INTRAVENOUS | Status: DC | PRN
  Administered 2020-01-28: 140 ug/kg/min via INTRAVENOUS

## 2020-01-28 MED ORDER — PHENYLEPHRINE HCL (PRESSORS) 10 MG/ML IV SOLN
INTRAVENOUS | Status: DC | PRN
  Administered 2020-01-28 (×2): 100 ug via INTRAVENOUS

## 2020-01-28 MED ORDER — PROPOFOL 10 MG/ML IV BOLUS
INTRAVENOUS | Status: DC | PRN
  Administered 2020-01-28: 70 mg via INTRAVENOUS
  Administered 2020-01-28: 30 mg via INTRAVENOUS

## 2020-01-28 MED ORDER — CEFAZOLIN SODIUM-DEXTROSE 2-4 GM/100ML-% IV SOLN
2.0000 g | INTRAVENOUS | Status: AC
  Administered 2020-01-29: 2 g via INTRAVENOUS
  Filled 2020-01-28: qty 100

## 2020-01-28 NOTE — Transfer of Care (Signed)
Immediate Anesthesia Transfer of Care Note  Patient: James Sanford  Procedure(s) Performed: COLONOSCOPY WITH PROPOFOL (N/A )  Patient Location: PACU  Anesthesia Type:General  Level of Consciousness: sedated  Airway & Oxygen Therapy: Patient Spontanous Breathing  Post-op Assessment: Report given to RN and Post -op Vital signs reviewed and stable  Post vital signs: Reviewed and stable  Last Vitals:  Vitals Value Taken Time  BP 84/62 01/28/20 1207  Temp 36.8 C 01/28/20 1205  Pulse 77 01/28/20 1210  Resp 17 01/28/20 1210  SpO2 96 % 01/28/20 1210  Vitals shown include unvalidated device data.  Last Pain:  Vitals:   01/28/20 1205  TempSrc: Temporal  PainSc: Asleep         Complications: No complications documented.

## 2020-01-28 NOTE — TOC Progression Note (Signed)
Transition of Care [facility]) - Progression Note    Patient Details  Name: James Sanford MRN: [id number] Date of Birth: 20-Apr-1955  Transition of Care [facility]) CM/SW Contact  Liliana Cline, LCSW Phone Number: 01/28/2020, 2:23 PM  Clinical Narrative:   Informed MD of wife telling Weekend TOC that patient may be suicidal. Provided Substance Abuse resources to wife at bedside as requested.     Expected Discharge Plan: Home/Self Care Barriers to Discharge: Continued Medical Work up  Expected Discharge Plan and Services Expected Discharge Plan: Home/Self Care In-house Referral: Clinical Social Work     Living arrangements for the past 2 months: Single Family Home                                       Social Determinants of Health (SDOH) Interventions    Readmission Risk Interventions No flowsheet data found.

## 2020-01-28 NOTE — Progress Notes (Addendum)
[facility] Progress Note  Patient: James Sanford    WNI:[id number]  DOA: 01/25/2020     Date of Service: the patient was seen and examined on 01/28/2020  Brief hospital course: Past medical history of HTN, A. fib, OSA, HLD, type II DM, alcohol abuse.  Presents with GI bleed history. SP EGD.  Poor peripheral p.o. but without 4 or multiple rehab comfort again tomorrow. Currently plan is supportive measures for bleeding and awaiting results for colonoscopy.  Assessment and Plan: 1.  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 Upper GI endoscopy negative for any acute bleeding. Currently on Protonix. Octreotide was initiated but discontinued now. Appreciate GI consultation. EGD shows no evidence of acute bleeding.  No evidence of varices. Colonoscopy was performed twice.  Prep was poor twice.  No acute bleeding reported.  No acute intervention was performed other than biopsy of the polyp. GI recommend to resume Eliquis. Monitor H&H and transfuse hemoglobin less than 7.  2.  Alcohol abuse No evidence of withdrawal for now. Continue CIWA protocol for now.  3.  Type 2 diabetes mellitus, controlled. Continue sliding scale insulin for now.  4.  Paroxysmal A. fib On Eliquis. Rate currently controlled. Initially holding Eliquis for GI bleeding. Now holding Eliquis for orthopedic procedure scheduled tomorrow.  5.  Chronic right knee pain with acute component. X-ray actually shows periprosthetic fracture. Orthopedic consulted. Scheduled for ORIF tomorrow. No evidence of acute injury from the outside.  6.  Thrombocytopenia, chronic, improving Mild worsening right now likely in the setting of PRBC transfusion. In the setting of alcohol abuse. Monitor for now.  Supportive measures  7.  Emphysema Emphysematous changes Seen on CT scan. Likely patient has COPD.  Outpatient work-up recommended.  8.  Depression. Concern for suicidal ideation. Psychiatry was consulted.   Patient does not have suicidal ideation right now. Does not meet inpatient psychiatric criteria. Discontinue sitter.  Diet: No nausea no vomiting.  Clear liquid diet.  No fever no chills. DVT Prophylaxis:   SCDs Start: 01/25/20 2119    Advance goals of care discussion: Full code  Family Communication: family was present at bedside, at the time of interview.  The pt provided permission to discuss medical plan with the family. Opportunity was given to ask question and all questions were answered satisfactorily.   Disposition:  Status is: Inpatient  Remains inpatient appropriate because:Ongoing diagnostic testing needed not appropriate for outpatient work up   Dispo: The patient is from: Home              Anticipated d/c is to: Home              Anticipated d/c date is: 2 days              Patient currently is not medically stable to d/c.  Subjective: Denies any acute complaint.  No nausea no vomiting.  No fever no chills.  Right knee still pain.  Physical Exam:  General: Appear in mild distress, no Rash; Oral Mucosa Clear, moist. no Abnormal Neck Mass Or lumps, Conjunctiva normal  Cardiovascular: S1 and S2 Present, no Murmur, Respiratory: good respiratory effort, Bilateral Air entry present and CTA, no Crackles, no wheezes Abdomen: Bowel Sound present, Soft and no tenderness Extremities: no Pedal edema Neurology: alert and oriented to time, place, and person affect appropriate. no new focal deficit Gait not checked due to patient safety concerns   Vitals:   01/28/20 1205 01/28/20 1215 01/28/20 1225 01/28/20 1611  BP: (!) 84/62 (!) 93/59  Marland Kitchen)  137/94  Pulse: 90  68 89  Resp: 20  20 18   Temp: 98.3 F (36.8 C)   98.4 F (36.9 C)  TempSrc: Temporal   Oral  SpO2: 94%  100% 100%  Weight:      Height:       No intake or output data in the 24 hours ending 01/28/20 1817 Filed Weights   01/25/20 1526 01/27/20 1034  Weight: 81.6 kg 85.3 kg    Data Reviewed: I have  personally reviewed and interpreted daily labs, tele strips, imagings as discussed above. I reviewed all nursing notes, pharmacy notes, vitals, pertinent old records I have discussed plan of care as described above with RN and patient/family.  CBC: Recent Labs  Lab 01/25/20 1543 01/25/20 1754 01/25/20 2204 01/26/20 0421 01/26/20 1140 01/26/20 1140 01/26/20 1751 01/26/20 1751 01/27/20 0901 01/27/20 1254 01/27/20 1929 01/28/20 0313 01/28/20 1402  WBC 10.2   < > 8.5   < > 6.9  --  7.4  --  4.9  --  8.3  --  6.5  NEUTROABS 9.0*  --  6.9  --   --   --   --   --  3.8  --  6.4  --   --   HGB 8.7*   < > 8.4*   < > 7.7*   < > 8.3*   < > 7.7* 7.7* 8.0* 7.4* 8.4*  HCT 25.7*   < > 25.0*   < > 23.4*   < > 25.5*   < > 23.1* 23.7* 24.4* 22.8* 25.7*  MCV 101.2*   < > 100.4*   < > 102.6*  --  104.5*  --  102.2*  --  104.7*  --  106.2*  PLT 123*   < > 114*   < > 95*  --  102*  --  85*  --  104*  --  103*   < > = values in this interval not displayed.   Basic Metabolic Panel: Recent Labs  Lab 01/25/20 1543 01/26/20 0421 01/27/20 0901 01/28/20 0313  NA 139 140 139 140  K 4.6 4.0 3.7 3.8  CL 109 111 108 110  CO2 23 24 26 25   GLUCOSE 147* 156* 138* 101*  BUN 19 18 9  7*  CREATININE 0.55* 0.67 0.72 0.70  CALCIUM 8.0* 7.7* 7.5* 7.7*  MG  --   --  2.5* 2.2    Studies: No results found.  Scheduled Meds: . folic acid  1 mg Oral Daily  . [START ON 01/29/2020] pantoprazole  40 mg Intravenous Q12H  . thiamine  100 mg Oral Daily   Or  . thiamine  100 mg Intravenous Daily  . vitamin B-12  1,000 mcg Oral Daily   Continuous Infusions: . sodium chloride 20 mL/hr at 01/28/20 1122  . [START ON 01/29/2020]  ceFAZolin (ANCEF) IV    . octreotide  (SANDOSTATIN)    IV infusion 50 mcg/hr (01/27/20 0117)   PRN Meds: LORazepam **OR** LORazepam, morphine injection, ondansetron **OR** ondansetron (ZOFRAN) IV  Time spent: 35 minutes  Author: 01/30/20, MD [facility] 01/28/2020 6:17  PM  To reach On-call, see care teams to locate the attending and reach out via [email or web address]. Between 7PM-7AM, please contact night-coverage If you still have difficulty reaching the attending provider, please page the [facility] (Director on Call) for [facility] on amion for assistance.

## 2020-01-28 NOTE — Consult Note (Signed)
[facility] Face-to-Face Psychiatry Consult   Reason for Consult: Consult for this 64 year old man in the hospital for work-up of GI bleed.  Concern from both the family and treatment team about depressive symptoms and the possibility of suicidal ideation as well as concern about alcohol use Referring Physician: Allena Katz Patient Identification: James Sanford MRN:  [id number] Principal Diagnosis: Alcohol abuse Diagnosis:  Principal Problem:   Alcohol abuse Active Problems:   Acute GI bleeding   Diabetes mellitus type 2, uncomplicated (HCC)   Cardiomyopathy, idiopathic (HCC)   Hyperlipidemia   Paroxysmal A-fib (HCC)   Obesity   Hypertension   Conceited it was a great history will call admit   Total Time spent with patient: 1 hour  Subjective:   James Sanford is a 64 y.o. male patient admitted with "I had blood in my stool".  HPI: Patient seen chart reviewed.  Spoke with his wife afterwards away from the patient as well.  64 year old man is in the hospital for evaluation of GI bleed.  Family had reported concerned about his mood with concern that he may be passively or actively having suicidal thoughts.  On interview the patient was cooperative but passively so.  Did not appear to be overly engaged but was communicating.  Patient says he is only here for evaluation of his GI bleed.  He seems rather uninterested in the details of his medical situation and problems and tells me his only goal is to go back home.  I ask him if he felt like he was having any problems with his mood before coming into the hospital and he insists that he had not, he feels fine.  He insists that he had been sleeping fine.  Admits to some trouble with his appetite.  He absolutely denies any suicidal ideation.  Categorically insists that he has never had any thoughts whatsoever of wishing he were dead.  Denies having ever said anything like that.  Patient admits that he drinks although he minimizes the amount of  it and tends to talk around the problem.  Nevertheless he stated that when he went home from the hospital he plan to stop drinking.  I tried to see if I could engage him in telling me what was motivating him to make that change but he falls back on to simplistic statements like "I should not drink".  Patient denies any hallucinations.  Denies any psychotic symptoms at all.  On testing he was able to remember 3 out of 3 objects immediately and at 3 minutes and was fully oriented.  Speaking to his wife afterwards she tells me that the family has been concerned about his mood his behavior and his drinking.  She tells me he keeps large bottles of wine in a bathroom in the house that no one else uses and will go in there for hours to drink by himself.  She says that his mood often seems bad.  On more than 1 occasion when faced with a medical problem he has said that he was just going to die.  Patient will not engage with family's attempts to get him to discuss his feelings or discuss his medical issues any more than that.  Nevertheless the patient has clearly suffered some serious problems from his drinking.  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  Apparently he suffered a fall in the past with a broken bone that  was probably related to drinking.  Denies other drug use.  Wife also reports having recently found a firearm in the house that she had not been aware of.  Past Psychiatric History: He reports his only contact with mental health providers in the past was when he had bariatric surgery he had to see a psychologist.  Denies ever having received any actual mental health treatment.  Denies any history of suicide attempts.  Never apparently been engaged in any kind of substance abuse treatment.  Risk to Self:   Risk to Others:   Prior Inpatient Therapy:   Prior Outpatient Therapy:    Past Medical History:  Past Medical  History:  Diagnosis Date  . Arthritis   . Diabetes (HCC)   . Hyperlipemia   . Hypertension   . Sleep apnea     Past Surgical History:  Procedure Laterality Date  . GASTRIC BYPASS    . HERNIA REPAIR    . JOINT REPLACEMENT Bilateral    Knee surgery  . OPEN REDUCTION INTERNAL FIXATION (ORIF) DISTAL RADIAL FRACTURE Right 10/30/2017   Procedure: OPEN REDUCTION INTERNAL FIXATION (ORIF) DISTAL RADIAL FRACTURE;  Surgeon: Garnette Gunner, MD;  Location: [facility] ORS;  Service: Orthopedics;  Laterality: Right;   Family History:  Family History  Problem Relation Age of Onset  . Stroke Mother   . Suicidality Father    Family Psychiatric  History: Patient would not tell me but afterwards his wife informed me that the patient's father had killed himself.  Also several members of the close family had alcohol problems. Social History:  Social History   Substance and Sexual Activity  Alcohol Use Yes   Comment: beer occasionally     Social History   Substance and Sexual Activity  Drug Use Not Currently    Social History   Socioeconomic History  . Marital status: Married    Spouse name: Not on file  . Number of children: Not on file  . Years of education: Not on file  . Highest education level: Not on file  Occupational History  . Not on file  Tobacco Use  . Smoking status: Never Smoker  . Smokeless tobacco: Former Neurosurgeon    Types: Chew  . Tobacco comment: Luellen Pucker about 4 years ago  Vaping Use  . Vaping Use: Never used  Substance and Sexual Activity  . Alcohol use: Yes    Comment: beer occasionally  . Drug use: Not Currently  . Sexual activity: Not on file  Other Topics Concern  . Not on file  Social History Narrative   Lives at home with his wife. Independent baseline   Social Determinants of Health   Financial Resource Strain:   . Difficulty of Paying Living Expenses: Not on file  Food Insecurity:   . Worried About Programme researcher, broadcasting/film/video in the Last Year: Not on file  . Ran  Out of Food in the Last Year: Not on file  Transportation Needs:   . Lack of Transportation (Medical): Not on file  . Lack of Transportation (Non-Medical): Not on file  Physical Activity:   . Days of Exercise per Week: Not on file  . Minutes of Exercise per Session: Not on file  Stress:   . Feeling of Stress : Not on file  Social Connections:   . Frequency of Communication with Friends and Family: Not on file  . Frequency of Social Gatherings with Friends and Family: Not on file  . Attends Religious Services: Not on file  .  Active Member of Clubs or Organizations: Not on file  . Attends Banker Meetings: Not on file  . Marital Status: Not on file   Additional Social History:    Allergies:   Allergies  Allergen Reactions  . Penicillins     Unknown childhood reaction    Labs:  Results for orders placed or performed during the hospital encounter of 01/25/20 (from the past 48 hour(s))  Glucose, capillary     Status: Abnormal   Collection Time: 01/26/20  4:23 PM  Result Value Ref Range   Glucose-Capillary 118 (H) 70 - 99 mg/dL    Comment: Glucose reference range applies only to samples taken after fasting for at least 8 hours.   Comment 1 Notify RN   CBC     Status: Abnormal   Collection Time: 01/26/20  5:51 PM  Result Value Ref Range   WBC 7.4 4.0 - 10.5 K/uL   RBC 2.44 (L) 4.22 - 5.81 MIL/uL   Hemoglobin 8.3 (L) 13.0 - 17.0 g/dL   HCT 13.0 (L) 39 - 52 %   MCV 104.5 (H) 80.0 - 100.0 fL   MCH 34.0 26.0 - 34.0 pg   MCHC 32.5 30.0 - 36.0 g/dL   RDW 86.5 78.4 - 69.6 %   Platelets 102 (L) 150 - 400 K/uL    Comment: Immature Platelet Fraction may be clinically indicated, consider ordering this additional test [id number]    nRBC 0.0 0.0 - 0.2 %    Comment: Performed at [facility], [address], [place], [place] [address]  Ferritin     Status: None   Collection Time: 01/26/20  5:51 PM  Result Value Ref Range   Ferritin 261 24 - 336 ng/mL     Comment: Performed at [facility], [address] [place], [place] [address]  Vitamin B12     Status: None   Collection Time: 01/26/20  5:51 PM  Result Value Ref Range   Vitamin B-12 338 180 - 914 pg/mL    Comment: (NOTE) This assay is not validated for testing neonatal or myeloproliferative syndrome specimens for Vitamin B12 levels. Performed at [facility], [address], [place], [place] [address]   Iron and TIBC     Status: Abnormal   Collection Time: 01/26/20  5:51 PM  Result Value Ref Range   Iron 39 (L) 45 - 182 ug/dL   TIBC 034 (L) 742 - 595 ug/dL   Saturation Ratios 24 17.9 - 39.5 %   UIBC 123 ug/dL    Comment: Performed at [facility], [address] [place], [place] [address]  Folate     Status: Abnormal   Collection Time: 01/26/20  5:51 PM  Result Value Ref Range   Folate 2.6 (L) >5.9 ng/mL    Comment: Performed at [facility], [address] [place], [place] [address]  Glucose, capillary     Status: Abnormal   Collection Time: 01/26/20  9:44 PM  Result Value Ref Range   Glucose-Capillary 217 (H) 70 - 99 mg/dL    Comment: Glucose reference range applies only to samples taken after fasting for at least 8 hours.   Comment 1 Notify RN   Glucose, capillary     Status: Abnormal   Collection Time: 01/27/20  7:58 AM  Result Value Ref Range   Glucose-Capillary 139 (H) 70 - 99 mg/dL    Comment: Glucose reference range applies only to samples taken after fasting for  at least 8 hours.  CBC with Differential/Platelet     Status: Abnormal   Collection Time: 01/27/20  9:01 AM  Result Value Ref Range   WBC 4.9 4.0 - 10.5 K/uL   RBC 2.26 (L) 4.22 - 5.81 MIL/uL   Hemoglobin 7.7 (L) 13.0 - 17.0 g/dL   HCT 16.1 (L) 39 - 52 %   MCV 102.2 (H) 80.0 - 100.0 fL   MCH 34.1 (H) 26.0 - 34.0 pg   MCHC 33.3 30.0 - 36.0 g/dL   RDW 09.6 04.5 - 40.9 %   Platelets 85 (L) 150 - 400 K/uL    Comment: Immature Platelet Fraction may be clinically  indicated, consider ordering this additional test [id number] CONSISTENT WITH PREVIOUS RESULT    nRBC 0.0 0.0 - 0.2 %   Neutrophils Relative % 77 %   Neutro Abs 3.8 1.7 - 7.7 K/uL   Lymphocytes Relative 14 %   Lymphs Abs 0.7 0.7 - 4.0 K/uL   Monocytes Relative 8 %   Monocytes Absolute 0.4 0.1 - 1.0 K/uL   Eosinophils Relative 1 %   Eosinophils Absolute 0.0 0.0 - 0.5 K/uL   Basophils Relative 0 %   Basophils Absolute 0.0 0.0 - 0.1 K/uL   Immature Granulocytes 0 %   Abs Immature Granulocytes 0.01 0.00 - 0.07 K/uL    Comment: Performed at [facility], [address] [place], [place] [address]  Comprehensive metabolic panel     Status: Abnormal   Collection Time: 01/27/20  9:01 AM  Result Value Ref Range   Sodium 139 135 - 145 mmol/L   Potassium 3.7 3.5 - 5.1 mmol/L   Chloride 108 98 - 111 mmol/L   CO2 26 22 - 32 mmol/L   Glucose, Bld 138 (H) 70 - 99 mg/dL    Comment: Glucose reference range applies only to samples taken after fasting for at least 8 hours.   BUN 9 8 - 23 mg/dL   Creatinine, Ser 1.30 0.61 - 1.24 mg/dL   Calcium 7.5 (L) 8.9 - 10.3 mg/dL   Total Protein 4.9 (L) 6.5 - 8.1 g/dL   Albumin 2.6 (L) 3.5 - 5.0 g/dL   AST 20 15 - 41 U/L   ALT 15 0 - 44 U/L   Alkaline Phosphatase 81 38 - 126 U/L   Total Bilirubin 1.4 (H) 0.3 - 1.2 mg/dL   GFR, Estimated >86 >57 mL/min    Comment: (NOTE) Calculated using the CKD-EPI Creatinine Equation (2021)    Anion gap 5 5 - 15    Comment: Performed at [facility], [address], [place], [place] [address]  Magnesium     Status: Abnormal   Collection Time: 01/27/20  9:01 AM  Result Value Ref Range   Magnesium 2.5 (H) 1.7 - 2.4 mg/dL    Comment: Performed at [facility], [address] [place], [place] [address]  Protime-INR     Status: None   Collection Time: 01/27/20  9:01 AM  Result Value Ref Range   Prothrombin Time 14.8 11.4 - 15.2 seconds   INR 1.2 0.8 - 1.2    Comment: (NOTE) INR goal  varies based on device and disease states. Performed at [facility], [address] [place], [place] [address]   Hemoglobin and hematocrit, blood     Status: Abnormal   Collection Time: 01/27/20 12:54 PM  Result Value Ref Range   Hemoglobin 7.7 (L) 13.0 - 17.0 g/dL   HCT 40.1 (L)  39 - 52 %    Comment: Performed at [facility], [address] [place], [place] [address]  Glucose, capillary     Status: Abnormal   Collection Time: 01/27/20  4:34 PM  Result Value Ref Range   Glucose-Capillary 151 (H) 70 - 99 mg/dL    Comment: Glucose reference range applies only to samples taken after fasting for at least 8 hours.  CBC with Differential/Platelet     Status: Abnormal   Collection Time: 01/27/20  7:29 PM  Result Value Ref Range   WBC 8.3 4.0 - 10.5 K/uL   RBC 2.33 (L) 4.22 - 5.81 MIL/uL   Hemoglobin 8.0 (L) 13.0 - 17.0 g/dL   HCT 60.4 (L) 39 - 52 %   MCV 104.7 (H) 80.0 - 100.0 fL   MCH 34.3 (H) 26.0 - 34.0 pg   MCHC 32.8 30.0 - 36.0 g/dL   RDW 54.0 98.1 - 19.1 %   Platelets 104 (L) 150 - 400 K/uL    Comment: Immature Platelet Fraction may be clinically indicated, consider ordering this additional test [id number] CONSISTENT WITH PREVIOUS RESULT    nRBC 0.0 0.0 - 0.2 %   Neutrophils Relative % 77 %   Neutro Abs 6.4 1.7 - 7.7 K/uL   Lymphocytes Relative 14 %   Lymphs Abs 1.2 0.7 - 4.0 K/uL   Monocytes Relative 8 %   Monocytes Absolute 0.7 0.1 - 1.0 K/uL   Eosinophils Relative 1 %   Eosinophils Absolute 0.1 0.0 - 0.5 K/uL   Basophils Relative 0 %   Basophils Absolute 0.0 0.0 - 0.1 K/uL   Immature Granulocytes 0 %   Abs Immature Granulocytes 0.02 0.00 - 0.07 K/uL    Comment: Performed at [facility], [address] [place], [place] [address]  Glucose, capillary     Status: None   Collection Time: 01/27/20  8:47 PM  Result Value Ref Range   Glucose-Capillary 77 70 - 99 mg/dL    Comment: Glucose reference range applies only to samples taken after  fasting for at least 8 hours.  Hemoglobin and hematocrit, blood     Status: Abnormal   Collection Time: 01/28/20  3:13 AM  Result Value Ref Range   Hemoglobin 7.4 (L) 13.0 - 17.0 g/dL   HCT 57.8 (L) 39 - 52 %    Comment: Performed at [facility], [address] [place], [place] [address]  Comprehensive metabolic panel     Status: Abnormal   Collection Time: 01/28/20  3:13 AM  Result Value Ref Range   Sodium 140 135 - 145 mmol/L   Potassium 3.8 3.5 - 5.1 mmol/L   Chloride 110 98 - 111 mmol/L   CO2 25 22 - 32 mmol/L   Glucose, Bld 101 (H) 70 - 99 mg/dL    Comment: Glucose reference range applies only to samples taken after fasting for at least 8 hours.   BUN 7 (L) 8 - 23 mg/dL   Creatinine, Ser 9.52 0.61 - 1.24 mg/dL   Calcium 7.7 (L) 8.9 - 10.3 mg/dL   Total Protein 4.7 (L) 6.5 - 8.1 g/dL   Albumin 2.5 (L) 3.5 - 5.0 g/dL   AST 15 15 - 41 U/L   ALT 14 0 - 44 U/L   Alkaline Phosphatase 81 38 - 126 U/L   Total Bilirubin 1.2 0.3 - 1.2 mg/dL   GFR, Estimated >84 >13 mL/min    Comment: (NOTE) Calculated using the CKD-EPI Creatinine Equation (2021)  Anion gap 5 5 - 15    Comment: Performed at [facility], [address] [place], [place] [address]  Protime-INR     Status: None   Collection Time: 01/28/20  3:13 AM  Result Value Ref Range   Prothrombin Time 13.3 11.4 - 15.2 seconds   INR 1.1 0.8 - 1.2    Comment: (NOTE) INR goal varies based on device and disease states. Performed at [facility], [address] [place], [place] [address]   Magnesium     Status: None   Collection Time: 01/28/20  3:13 AM  Result Value Ref Range   Magnesium 2.2 1.7 - 2.4 mg/dL    Comment: Performed at [facility], [address] [place], [place] [address]  Glucose, capillary     Status: None   Collection Time: 01/28/20  7:30 AM  Result Value Ref Range   Glucose-Capillary 89 70 - 99 mg/dL    Comment: Glucose reference range applies only to samples  taken after fasting for at least 8 hours.  CBC     Status: Abnormal   Collection Time: 01/28/20  2:02 PM  Result Value Ref Range   WBC 6.5 4.0 - 10.5 K/uL   RBC 2.42 (L) 4.22 - 5.81 MIL/uL   Hemoglobin 8.4 (L) 13.0 - 17.0 g/dL   HCT 88.8 (L) 39 - 52 %   MCV 106.2 (H) 80.0 - 100.0 fL   MCH 34.7 (H) 26.0 - 34.0 pg   MCHC 32.7 30.0 - 36.0 g/dL   RDW 28.0 03.4 - 91.7 %   Platelets 103 (L) 150 - 400 K/uL    Comment: Immature Platelet Fraction may be clinically indicated, consider ordering this additional test [id number]    nRBC 0.0 0.0 - 0.2 %    Comment: Performed at [facility], [address] [place], [place] [address]    Current Facility-Administered Medications  Medication Dose Route Frequency Provider Last Rate Last Admin  . 0.9 %  sodium chloride infusion   Intravenous Continuous Toney Reil, MD 20 mL/hr at 01/28/20 1122 Continued from Pre-op at 01/28/20 1122  . folic acid (FOLVITE) tablet 1 mg  1 mg Oral Daily Rolly Salter, MD      . insulin aspart (novoLOG) injection 0-5 Units  0-5 Units Subcutaneous QHS Rometta Emery, MD   1 Units at 01/26/20 2203  . insulin aspart (novoLOG) injection 0-9 Units  0-9 Units Subcutaneous TID WC Rometta Emery, MD   2 Units at 01/27/20 1710  . LORazepam (ATIVAN) tablet 1-4 mg  1-4 mg Oral Q1H PRN Manuela Schwartz, NP       Or  . LORazepam (ATIVAN) injection 1-4 mg  1-4 mg Intravenous Q1H PRN Manuela Schwartz, NP      . morphine 2 MG/ML injection 2 mg  2 mg Intravenous Q4H PRN Manuela Schwartz, NP      . octreotide (SANDOSTATIN) 500 mcg in sodium chloride 0.9 % 250 mL (2 mcg/mL) infusion  50 mcg/hr Intravenous Continuous Toney Reil, MD 25 mL/hr at 01/27/20 0117 50 mcg/hr at 01/27/20 0117  . ondansetron (ZOFRAN) tablet 4 mg  4 mg Oral Q6H PRN Rometta Emery, MD       Or  . ondansetron (ZOFRAN) injection 4 mg  4 mg Intravenous Q6H PRN Rometta Emery, MD      . Melene Muller ON 01/29/2020] pantoprazole (PROTONIX)  injection 40 mg  40 mg Intravenous Q12H Rometta Emery, MD      .  thiamine tablet 100 mg  100 mg Oral Daily Manuela Schwartz, NP   100 mg at 01/26/20 1112   Or  . thiamine (B-1) injection 100 mg  100 mg Intravenous Daily Manuela Schwartz, NP      . vitamin B-12 (CYANOCOBALAMIN) tablet 1,000 mcg  1,000 mcg Oral Daily Rolly Salter, MD        Musculoskeletal: Strength & Muscle Tone: within normal limits Gait & Station: normal Patient leans: N/A  Psychiatric Specialty Exam: Physical Exam Vitals and nursing note reviewed.  Constitutional:      Appearance: He is well-developed. He is ill-appearing.  HENT:     Head: Normocephalic and atraumatic.  Eyes:     Conjunctiva/sclera: Conjunctivae normal.     Pupils: Pupils are equal, round, and reactive to light.  Cardiovascular:     Heart sounds: Normal heart sounds.  Pulmonary:     Effort: Pulmonary effort is normal.  Abdominal:     Palpations: Abdomen is soft.  Musculoskeletal:        General: Normal range of motion.     Cervical back: Normal range of motion.  Skin:    General: Skin is warm and dry.     Findings: Bruising present.  Neurological:     General: No focal deficit present.     Mental Status: He is alert.  Psychiatric:        Attention and Perception: He is inattentive.        Mood and Affect: Affect is blunt.        Speech: Speech normal.        Behavior: Behavior is not agitated or aggressive.        Thought Content: Thought content is not paranoid. Thought content does not include homicidal or suicidal ideation.        Cognition and Memory: Cognition normal.        Judgment: Judgment is inappropriate.     Review of Systems  Constitutional: Negative.   HENT: Negative.   Eyes: Negative.   Respiratory: Negative.   Cardiovascular: Negative.   Gastrointestinal: Positive for anal bleeding and blood in stool.  Musculoskeletal: Negative.   Skin: Negative.   Neurological: Negative.   Psychiatric/Behavioral:  Negative for dysphoric mood, hallucinations and suicidal ideas.    Blood pressure (!) 93/59, pulse 68, temperature 98.3 F (36.8 C), temperature source Temporal, resp. rate 20, height 6' (1.829 m), weight 85.3 kg, SpO2 100 %.Body mass index is 25.5 kg/m.  General Appearance: Casual  Eye Contact:  Minimal  Speech:  Slow  Volume:  Decreased  Mood:  Dysphoric  Affect:  Constricted  Thought Process:  Coherent  Orientation:  Full (Time, Place, and Person)  Thought Content:  Logical  Suicidal Thoughts:  No  Homicidal Thoughts:  No  Memory:  Immediate;   Fair Recent;   Fair Remote;   Fair  Judgement:  Impaired  Insight:  Shallow  Psychomotor Activity:  Decreased  Concentration:  Concentration: Fair  Recall:  [employer] of Knowledge:  Fair  Language:  Fair  Akathisia:  No  Handed:  Right  AIMS (if indicated):     Assets:  Financial Resources/Insurance Housing Social Support  ADL's:  Impaired  Cognition:  Impaired,  Mild  Sleep:        Treatment Plan Summary: Plan 64 year old man who clearly has an alcohol problem.  He is willing to acknowledge it a little bit but tends to dismiss it rather superficially saying that he will simply  stop drinking when he goes home and will have no problem with it.  Does not seem to be willing to acknowledge all of the impact the drinking is had on him.  He certainly does seem a little bit down in his spirits but he is currently denying any suicidal ideation.  I pointed out to his wife however that he does have multiple risk factors including a first-degree relative suicide, age gender medical problems.  Patient has been prescribed antidepressants by his primary care doctor but has declined to take them.  I think that the best thing that he could do would be to stop drinking would have the biggest immediate impact on his mood.  I encouraged his wife to remove all alcohol and firearms from the house before he goes home.  We discussed medications for alcohol  abuse.  I think there is probably limited benefit from this in somebody who is not otherwise trying to make a real effort to stop drinking so I do not think I am going to start any naltrexone or acamprosate.  I will come back and check on him if he is still in the hospital tomorrow.  Patient does not meet commitment criteria and would not be a candidate for admission to the inpatient psychiatric unit.  Tried to do some supportive counseling with his wife and education.  Disposition: Patient does not meet criteria for psychiatric inpatient admission. Supportive therapy provided about ongoing stressors. Discussed crisis plan, support from social network, calling 911, coming to the Emergency Department, and calling Suicide Hotline.  James Rasmussen, MD 01/28/2020 3:21 PM

## 2020-01-28 NOTE — Consult Note (Signed)
ORTHOPAEDIC CONSULTATION  REQUESTING PHYSICIAN: Rolly Salter, MD  Chief Complaint:   Right knee pain and swelling.  History of Present Illness: James Sanford is a 64 y.o. male with a history of diabetes, hyperlipidemia, hypertension, sleep apnea, and alcohol abuse who normally lives independently with his wife.  He is known to drink 2-5 drinks/day on a daily basis.  The patient apparently was in his normal state of health on the day of admission 3 days ago when he began to develop bright red blood per rectum mixed with black tarry stool.  He presented to the emergency room where he was diagnosed with GI bleed and subsequently admitted for further evaluation and treatment.  The patient also complained of right knee pain and swelling on admission.  X-rays were obtained the following evening, but apparently were not read until early Sunday morning.  Apparently, the report of an essentially nondisplaced periprosthetic supracondylar femur fracture of the right knee was not observed until earlier today so orthopedic consultation was requested at this time.  The patient believes that he may have fallen several times over the 24 hours preceding his presentation to the emergency room and subsequent admission.  The patient denies any numbness or paresthesias down his leg to his foot.  Past Medical History:  Diagnosis Date  . Arthritis   . Diabetes (HCC)   . Hyperlipemia   . Hypertension   . Sleep apnea    Past Surgical History:  Procedure Laterality Date  . GASTRIC BYPASS    . HERNIA REPAIR    . JOINT REPLACEMENT Bilateral    Knee surgery  . OPEN REDUCTION INTERNAL FIXATION (ORIF) DISTAL RADIAL FRACTURE Right 10/30/2017   Procedure: OPEN REDUCTION INTERNAL FIXATION (ORIF) DISTAL RADIAL FRACTURE;  Surgeon: Garnette Gunner, MD;  Location: [facility] ORS;  Service: Orthopedics;  Laterality: Right;   Social History   Socioeconomic  History  . Marital status: Married    Spouse name: Not on file  . Number of children: Not on file  . Years of education: Not on file  . Highest education level: Not on file  Occupational History  . Not on file  Tobacco Use  . Smoking status: Never Smoker  . Smokeless tobacco: Former Neurosurgeon    Types: Chew  . Tobacco comment: Luellen Pucker about 4 years ago  Vaping Use  . Vaping Use: Never used  Substance and Sexual Activity  . Alcohol use: Yes    Comment: beer occasionally  . Drug use: Not Currently  . Sexual activity: Not on file  Other Topics Concern  . Not on file  Social History Narrative   Lives at home with his wife. Independent baseline   Social Determinants of Health   Financial Resource Strain:   . Difficulty of Paying Living Expenses: Not on file  Food Insecurity:   . Worried About Programme researcher, broadcasting/film/video in the Last Year: Not on file  . Ran Out of Food in the Last Year: Not on file  Transportation Needs:   . Lack of Transportation (Medical): Not on file  . Lack of Transportation (Non-Medical): Not on file  Physical Activity:   . Days of Exercise per Week: Not on file  . Minutes of Exercise per Session: Not on file  Stress:   . Feeling of Stress : Not on file  Social Connections:   . Frequency of Communication with Friends and Family: Not on file  . Frequency of Social Gatherings with Friends and Family: Not on file  .  Attends Religious Services: Not on file  . Active Member of Clubs or Organizations: Not on file  . Attends Banker Meetings: Not on file  . Marital Status: Not on file   Family History  Problem Relation Age of Onset  . Stroke Mother   . Suicidality Father    Allergies  Allergen Reactions  . Penicillins     Unknown childhood reaction   Prior to Admission medications   Medication Sig Start Date End Date Taking? Authorizing Provider  B Complex-C (B-COMPLEX WITH VITAMIN C) tablet Take 1 tablet by mouth daily.   Yes [provider]   ELIQUIS 5 MG TABS tablet Take 5 mg by mouth 2 (two) times daily. 01/05/20  Yes [provider]  Garlic 1200 MG CAPS Take 1,200 mg by mouth daily.   Yes [provider]  lisinopril (ZESTRIL) 5 MG tablet Take 5 mg by mouth daily. 08/29/19  Yes [provider]  metoprolol succinate (TOPROL-XL) 50 MG 24 hr tablet Take 50 mg by mouth daily. 08/31/19  Yes [provider]  Multiple Vitamin (MULTI-VITAMIN) tablet Take 1 tablet by mouth daily.   Yes [provider]  Omega-3 Fatty Acids (FISH OIL) 1000 MG CAPS Take 1,000 mg by mouth daily.   Yes [provider]  escitalopram (LEXAPRO) 10 MG tablet Take 10 mg by mouth daily. Patient not taking: Reported on 01/25/2020 03/27/19   [provider]   No results found.  Positive ROS: All other systems have been reviewed and were otherwise negative with the exception of those mentioned in the HPI and as above.  Physical Exam: General:  Alert, no acute distress Psychiatric:  Patient is competent for consent with normal mood and affect   Cardiovascular:  No pedal edema Respiratory:  No wheezing, non-labored breathing GI:  Abdomen is soft and non-tender Skin:  No lesions in the area of chief complaint Neurologic:  Sensation intact distally Lymphatic:  No axillary or cervical lymphadenopathy  Orthopedic Exam:  Orthopedic examination is limited to the right knee and lower extremity.  Skin inspection of the right knee is notable for a well-healed anterior midline surgical incision which shows no evidence for infection.  He has a 1-2+ effusion, but very little surrounding swelling.  No erythema, ecchymosis, abrasions, or other skin abnormalities are identified.  He has mild-moderate tenderness to palpation diffusely around the knee anteriorly, medially, and laterally.  He is neurovascularly intact to the right lower extremity and foot.  X-rays:  X-rays of the right femur are available for review and  have been reviewed by myself.  These films demonstrate a well-positioned total knee implant which is without evidence for any loosening or osteolysis.  There is an essentially nondisplaced oblique fracture involving the distal metaphyseal region of the femoral shaft.  No other acute bony abnormalities are identified.  Assessment: Closed minimally displaced periprosthetic supracondylar femur fracture, right knee.  Plan: The treatment options have been discussed in detail with the patient and his wife, including both surgical and nonsurgical choices.  The patient would like to proceed with surgical intervention to include an open reduction and internal fixation of the supracondylar femur fracture.  This procedure has been discussed in detail, as have the potential risks (including bleeding, infection, nerve and/or blood vessel injury, persistent or recurrent pain, stiffness of the knee, malunion or nonunion, need for further surgery, blood clots, strokes, heart attacks and/or arrhythmias, etc.) and benefits.  The patient states his understanding and wishes to proceed.  A formal written consent will be obtained by the nursing staff.  Thank you for asking me to participate in the care of this most pleasant yet unfortunate man.  I will be happy to follow him with you.   Maryagnes Amos, MD  Beeper #:  [phone]  01/28/2020 4:47 PM

## 2020-01-28 NOTE — Anesthesia Preprocedure Evaluation (Signed)
Anesthesia Evaluation  Patient identified by MRN, date of birth, ID band Patient awake    Reviewed: Allergy & Precautions, H&P , NPO status , Patient's Chart, lab work & pertinent test results, reviewed documented beta blocker date and time Preop documentation limited or incomplete due to emergent nature of procedure.  History of Anesthesia Complications Negative for: history of anesthetic complications  Airway Mallampati: II  TM Distance: >3 FB Neck ROM: full    Dental  (+) Edentulous Upper, Edentulous Lower, Dental Advidsory Given   Pulmonary neg shortness of breath, sleep apnea (history of, but none since weight loss) , neg COPD, neg recent URI,           Cardiovascular Exercise Tolerance: Good hypertension, (-) angina(-) CAD, (-) Past MI, (-) Cardiac Stents and (-) CABG + dysrhythmias (new onset, cleared by cards) Atrial Fibrillation (-) Valvular Problems/Murmurs     Neuro/Psych negative neurological ROS  negative psych ROS   GI/Hepatic (+)     substance abuse  alcohol use,   Endo/Other  negative endocrine ROSdiabetes  Renal/GU negative Renal ROS  negative genitourinary   Musculoskeletal  (+) Arthritis ,   Abdominal   Peds  Hematology  (+) anemia ,   Anesthesia Other Findings Past Medical History: No date: Arthritis No date: Diabetes mellitus without complication (HCC) No date: Hyperlipemia No date: Hypertension No date: Sleep apnea GI bleed  Reproductive/Obstetrics negative OB ROS                             Anesthesia Physical  Anesthesia Plan  ASA: III  Anesthesia Plan: General   Post-op Pain Management:  Regional for Post-op pain   Induction: Intravenous  PONV Risk Score and Plan: Propofol infusion and TIVA  Airway Management Planned: Nasal Cannula and Natural Airway  Additional Equipment:   Intra-op Plan:   Post-operative Plan:   Informed Consent: I have  reviewed the patients History and Physical, chart, labs and discussed the procedure including the risks, benefits and alternatives for the proposed anesthesia with the patient or authorized representative who has indicated his/her understanding and acceptance.     Dental Advisory Given  Plan Discussed with: Anesthesiologist, CRNA and Surgeon  Anesthesia Plan Comments:         Anesthesia Quick Evaluation

## 2020-01-28 NOTE — Anesthesia Postprocedure Evaluation (Signed)
Anesthesia Post Note  Patient: James Sanford  Procedure(s) Performed: COLONOSCOPY WITH PROPOFOL (N/A )  Patient location during evaluation: Endoscopy Anesthesia Type: General Level of consciousness: awake and alert Pain management: pain level controlled Vital Signs Assessment: post-procedure vital signs reviewed and stable Respiratory status: spontaneous breathing, nonlabored ventilation, respiratory function stable and patient connected to nasal cannula oxygen Cardiovascular status: blood pressure returned to baseline and stable Postop Assessment: no apparent nausea or vomiting Anesthetic complications: no   No complications documented.   Last Vitals:  Vitals:   01/28/20 1215 01/28/20 1225  BP: (!) 93/59   Pulse:  68  Resp:  20  Temp:    SpO2:  100%    Last Pain:  Vitals:   01/28/20 1225  TempSrc:   PainSc: 0-No pain                 Lenard Simmer

## 2020-01-28 NOTE — Op Note (Signed)
[facility] Gastroenterology Patient Name: James Sanford Procedure Date: 01/28/2020 11:23 AM MRN: [id number] Account #: [id number] Date of Birth: 1955-11-21 Admit Type: Outpatient Age: 64 Room: [facility] ENDO ROOM 1 Gender: Male Note Status: Finalized Procedure:             Colonoscopy Indications:           Chronic diarrhea, Hematochezia Providers:             Toney Reil MD, MD Medicines:             General Anesthesia Complications:         No immediate complications. Estimated blood loss: None. Procedure:             Pre-Anesthesia Assessment:                        - Prior to the procedure, a History and Physical was                         performed, and patient medications and allergies were                         reviewed. The patient is competent. The risks and                         benefits of the procedure and the sedation options and                         risks were discussed with the patient. All questions                         were answered and informed consent was obtained.                         Patient identification and proposed procedure were                         verified by the physician, the nurse, the                         anesthesiologist, the anesthetist and the technician                         in the pre-procedure area in the procedure room in the                         endoscopy suite. Mental Status Examination: alert and                         oriented. Airway Examination: normal oropharyngeal                         airway and neck mobility. Respiratory Examination:                         clear to auscultation. CV Examination: irregularly                         irregular rate and rhythm. Prophylactic Antibiotics:  The patient does not require prophylactic antibiotics.                         Prior Anticoagulants: The patient has taken Eliquis                         (apixaban), last dose was  5 days prior to procedure.                         ASA Grade Assessment: III - A patient with severe                         systemic disease. After reviewing the risks and                         benefits, the patient was deemed in satisfactory                         condition to undergo the procedure. The anesthesia                         plan was to use general anesthesia. Immediately prior                         to administration of medications, the patient was                         re-assessed for adequacy to receive sedatives. The                         heart rate, respiratory rate, oxygen saturations,                         blood pressure, adequacy of pulmonary ventilation, and                         response to care were monitored throughout the                         procedure. The physical status of the patient was                         re-assessed after the procedure.                        After obtaining informed consent, the colonoscope was                         passed under direct vision. Throughout the procedure,                         the patient's blood pressure, pulse, and oxygen                         saturations were monitored continuously. The                         Colonoscope was introduced through the anus and  advanced to the the cecum, identified by appendiceal                         orifice and ileocecal valve. The colonoscopy was                         extremely difficult due to significant looping.                         Successful completion of the procedure was aided by                         changing the patient to a supine position and applying                         abdominal pressure. The patient tolerated the                         procedure well. The quality of the bowel preparation                         was poor. Findings:      The perianal and digital rectal examinations were normal. Pertinent        negatives include normal sphincter tone and no palpable rectal lesions.      Normal mucosa was found in the entire colon. Biopsies for histology were       taken with a cold forceps from the entire colon for evaluation of       microscopic colitis.      Multiple diverticula were found in the entire colon.      Non-bleeding external hemorrhoids were found during retroflexion. The       hemorrhoids were medium-sized.      Copious quantities of liquid stool was found in the entire colon,       precluding visualization. Impression:            - Preparation of the colon was poor.                        - Normal mucosa in the entire examined colon. Biopsied.                        - Diverticulosis in the entire examined colon, likely                         source of hematochezia.                        - Non-bleeding external hemorrhoids.                        - Stool in the entire examined colon. Recommendation:        - Return patient to hospital ward for possible                         discharge same day.                        - Resume previous diet today.                        -  Resume Eliquis (apixaban) at prior dose tomorrow.                        - Await pathology results.                        - Return to GI clinic in 1 month. Procedure Code(s):     --- Professional ---                        [id number], Colonoscopy, flexible; with biopsy, single or                         multiple Diagnosis Code(s):     --- Professional ---                        K64.4, Residual hemorrhoidal skin tags                        K52.9, Noninfective gastroenteritis and colitis,                         unspecified                        K92.1, Melena (includes Hematochezia)                        K57.30, Diverticulosis of large intestine without                         perforation or abscess without bleeding CPT copyright 2019 American Medical Association. All rights reserved. The codes documented in  this report are preliminary and upon coder review may  be revised to meet current compliance requirements. Dr. Libby Maw Toney Reil MD, MD 01/28/2020 12:04:13 PM This report has been signed electronically. Number of Addenda: 0 Note Initiated On: 01/28/2020 11:23 AM Scope Withdrawal Time: 0 hours 10 minutes 6 seconds  Total Procedure Duration: 0 hours 29 minutes 14 seconds  Estimated Blood Loss:  Estimated blood loss: none.      [facility]

## 2020-01-28 NOTE — Progress Notes (Addendum)
Pt's wife was concern of the pt indirectly verbalizing suicidal ideation and family history of suicide (father committed suicide). MD was notified and Psych was consulted. 1:1 sitter was initiated. Psych cleared the pt, 1:1 sitter was DC. Consent has been signed by pt for tomorrow's procedure ORIF.

## 2020-01-29 ENCOUNTER — Inpatient Hospital Stay: Payer: BC Managed Care – PPO | Admitting: Certified Registered Nurse Anesthetist

## 2020-01-29 ENCOUNTER — Inpatient Hospital Stay: Payer: BC Managed Care – PPO

## 2020-01-29 ENCOUNTER — Encounter
Admission: EM | Disposition: A | Discharge status: Skilled Nursing Facility | Payer: Self-pay | Source: Home / Self Care | Attending: Internal Medicine

## 2020-01-29 DIAGNOSIS — F1010 Alcohol abuse, uncomplicated: Secondary | ICD-10-CM | POA: Diagnosis not present

## 2020-01-29 HISTORY — PX: ORIF FEMUR FRACTURE: SHX2119

## 2020-01-29 LAB — BASIC METABOLIC PANEL
Anion gap: 8 (ref 5–15)
BUN: 16 mg/dL (ref 8–23)
CO2: 25 mmol/L (ref 22–32)
Calcium: 7.5 mg/dL — ABNORMAL LOW (ref 8.9–10.3)
Chloride: 108 mmol/L (ref 98–111)
Creatinine, Ser: 0.95 mg/dL (ref 0.61–1.24)
GFR, Estimated: >60 mL/min (ref >60)
Glucose, Bld: 123 mg/dL — ABNORMAL HIGH (ref 70–99)
Potassium: 4 mmol/L (ref 3.5–5.1)
Sodium: 141 mmol/L (ref 135–145)

## 2020-01-29 LAB — LACTIC ACID, PLASMA
Lactic Acid, Venous: 1.6 mmol/L (ref 0.5–1.9)
Lactic Acid, Venous: 1.4 mmol/L (ref 0.5–1.9)

## 2020-01-29 LAB — CBC
HCT: 25.2 % — ABNORMAL LOW (ref 39.0–52.0)
HCT: 24.7 % — ABNORMAL LOW (ref 39.0–52.0)
Hemoglobin: 8.4 g/dL — ABNORMAL LOW (ref 13.0–17.0)
Hemoglobin: 7.9 g/dL — ABNORMAL LOW (ref 13.0–17.0)
MCH: 34.7 pg — ABNORMAL HIGH (ref 26.0–34.0)
MCH: 33.9 pg (ref 26.0–34.0)
MCHC: 33.3 g/dL (ref 30.0–36.0)
MCHC: 32 g/dL (ref 30.0–36.0)
MCV: 104.1 fL — ABNORMAL HIGH (ref 80.0–100.0)
MCV: 106 fL — ABNORMAL HIGH (ref 80.0–100.0)
Platelets: 96 10*3/uL — ABNORMAL LOW (ref 150–400)
Platelets: 92 10*3/uL — ABNORMAL LOW (ref 150–400)
RBC: 2.42 MIL/uL — ABNORMAL LOW (ref 4.22–5.81)
RBC: 2.33 MIL/uL — ABNORMAL LOW (ref 4.22–5.81)
RDW: 13.9 % (ref 11.5–15.5)
RDW: 14.3 % (ref 11.5–15.5)
WBC: 17.2 10*3/uL — ABNORMAL HIGH (ref 4.0–10.5)
WBC: 14.1 10*3/uL — ABNORMAL HIGH (ref 4.0–10.5)
nRBC: 0 % (ref 0.0–0.2)
nRBC: 0 % (ref 0.0–0.2)

## 2020-01-29 LAB — GLUCOSE, CAPILLARY
Glucose-Capillary: 124 mg/dL — ABNORMAL HIGH (ref 70–99)
Glucose-Capillary: 105 mg/dL — ABNORMAL HIGH (ref 70–99)
Glucose-Capillary: 116 mg/dL — ABNORMAL HIGH (ref 70–99)
Glucose-Capillary: 133 mg/dL — ABNORMAL HIGH (ref 70–99)
Glucose-Capillary: 168 mg/dL — ABNORMAL HIGH (ref 70–99)

## 2020-01-29 LAB — SURGICAL PATHOLOGY

## 2020-01-29 IMAGING — XA DG FEMUR 2+V*R*
6 series · 6 of 6 positions shown · non-contrast
Comparison: None.

CLINICAL DATA: Distal femur ORIF

EXAM:
RIGHT FEMUR 2 VIEWS; DG C-ARM 1-60 MIN

[Series 7: ortho standard · 1 of 1 slices shown (1 of 6)]
[im 1/1]
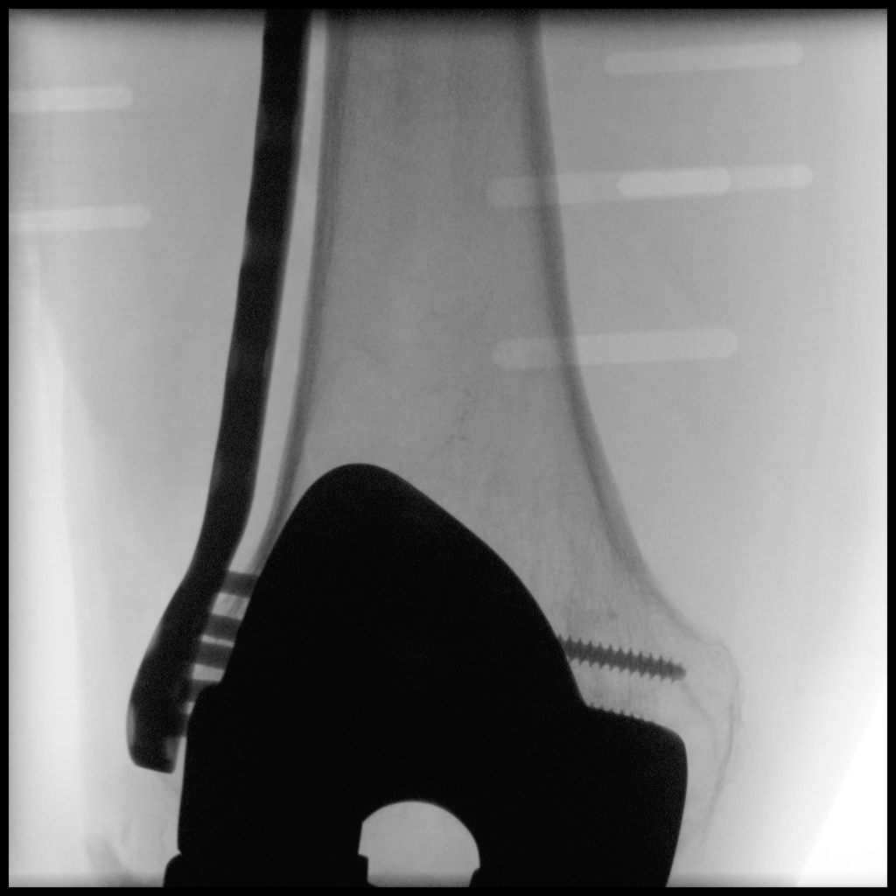

[Series 8: ortho standard · 1 of 1 slices shown (2 of 6)]
[im 1/1]
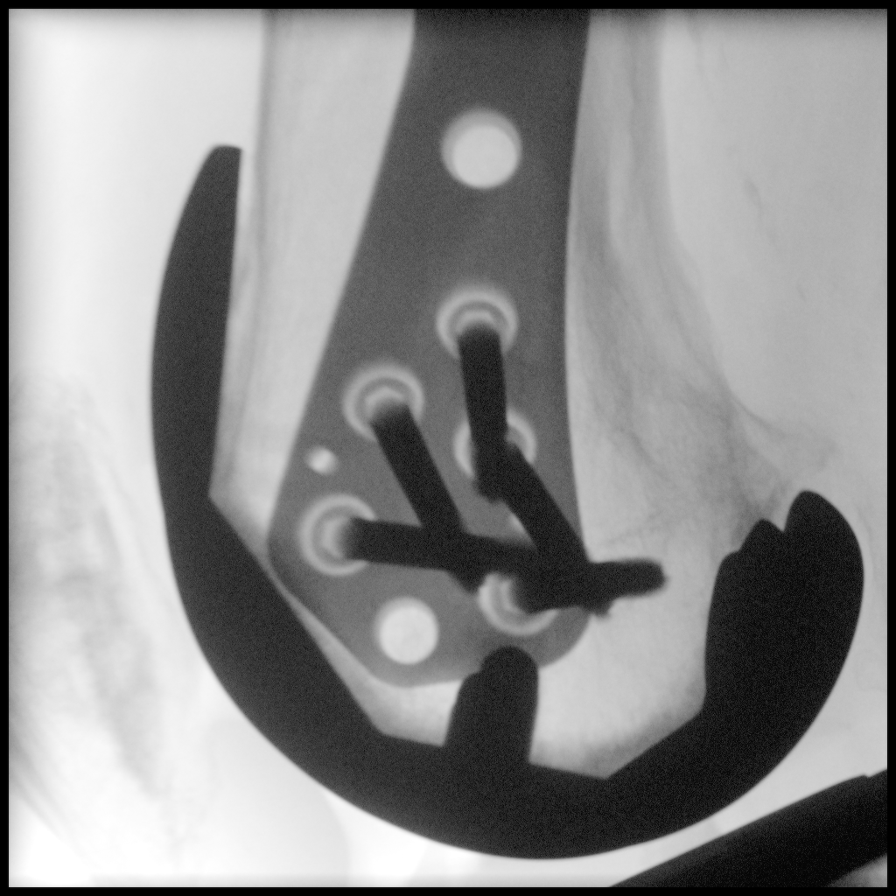

[Series 9: ortho standard · 1 of 1 slices shown (3 of 6)]
[im 1/1]
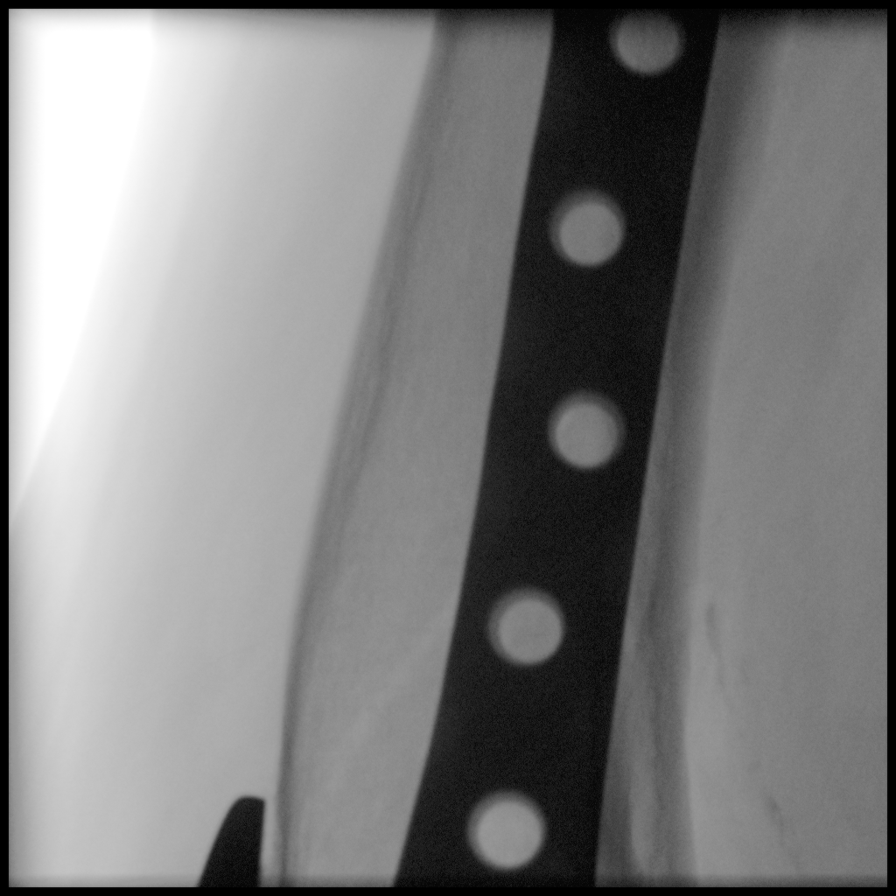

[Series 10: ortho standard · 1 of 1 slices shown (4 of 6)]
[im 1/1]
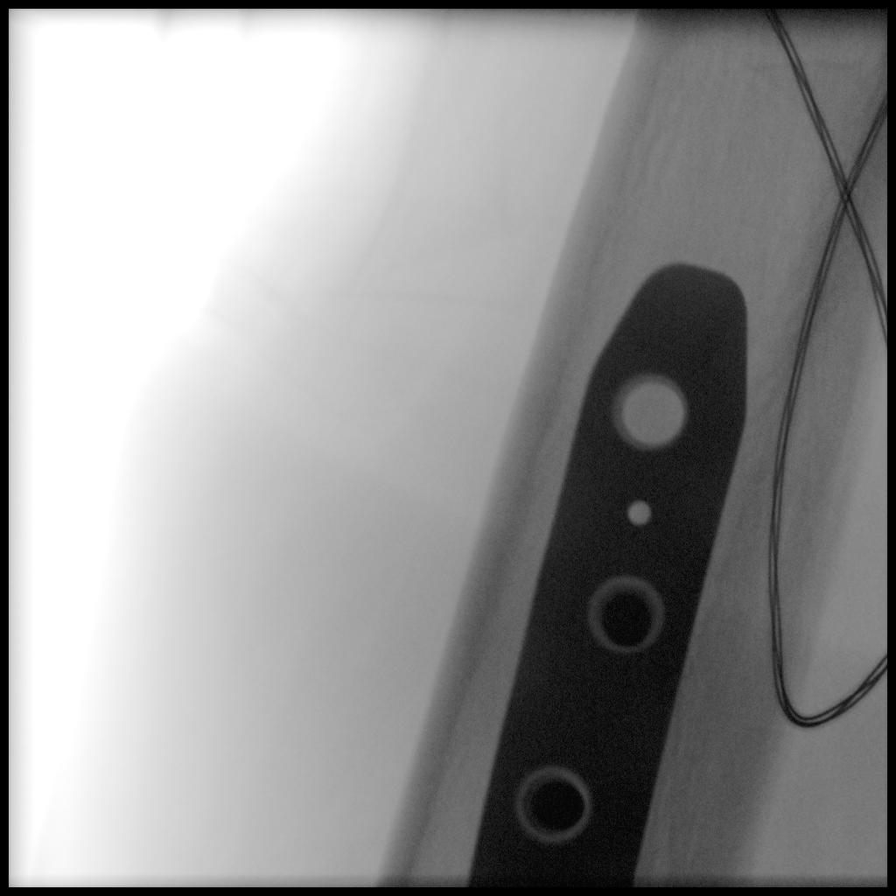

[Series 11: ortho standard · 1 of 1 slices shown (5 of 6)]
[im 1/1]
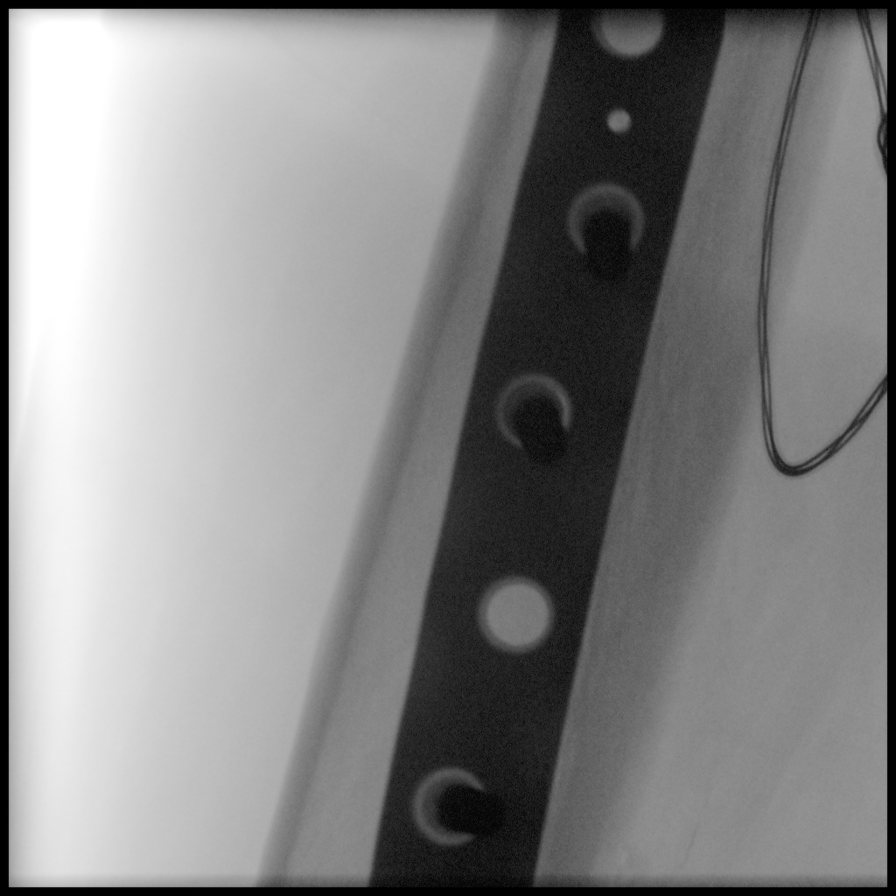

[Series 12: ortho standard · 1 of 1 slices shown (6 of 6)]
[im 1/1]
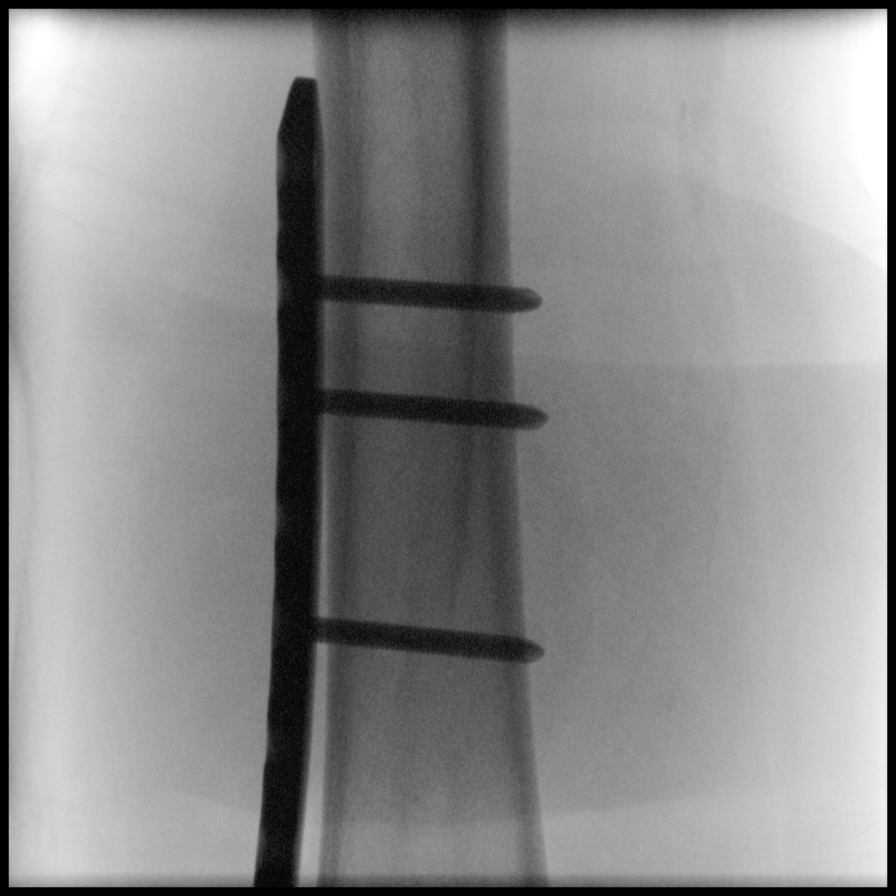

[6 of 6 positions shown; findings below may reference images not displayed]

FINDINGS: FLUORO TIME: 54 SECONDS.

Six C-arm fluoroscopic images were obtained intraoperatively and
submitted for post operative interpretation. These images
demonstrate open reduction internal fixation of a distal femur
fracture with plate and screws. Prior total knee arthroplasty,
partially imaged. Please see the performing provider's procedural
report for further detail.
IMPRESSION: Intraoperative fluoroscopic images, as detailed above.

## 2020-01-29 IMAGING — XA DG C-ARM 1-60 MIN
6 series · 6 of 6 positions shown · non-contrast
Comparison: None.

CLINICAL DATA: Distal femur ORIF

EXAM:
RIGHT FEMUR 2 VIEWS; DG C-ARM 1-60 MIN

[Series 7: ortho standard · 1 of 1 slices shown (1 of 6)]
[im 1/1]
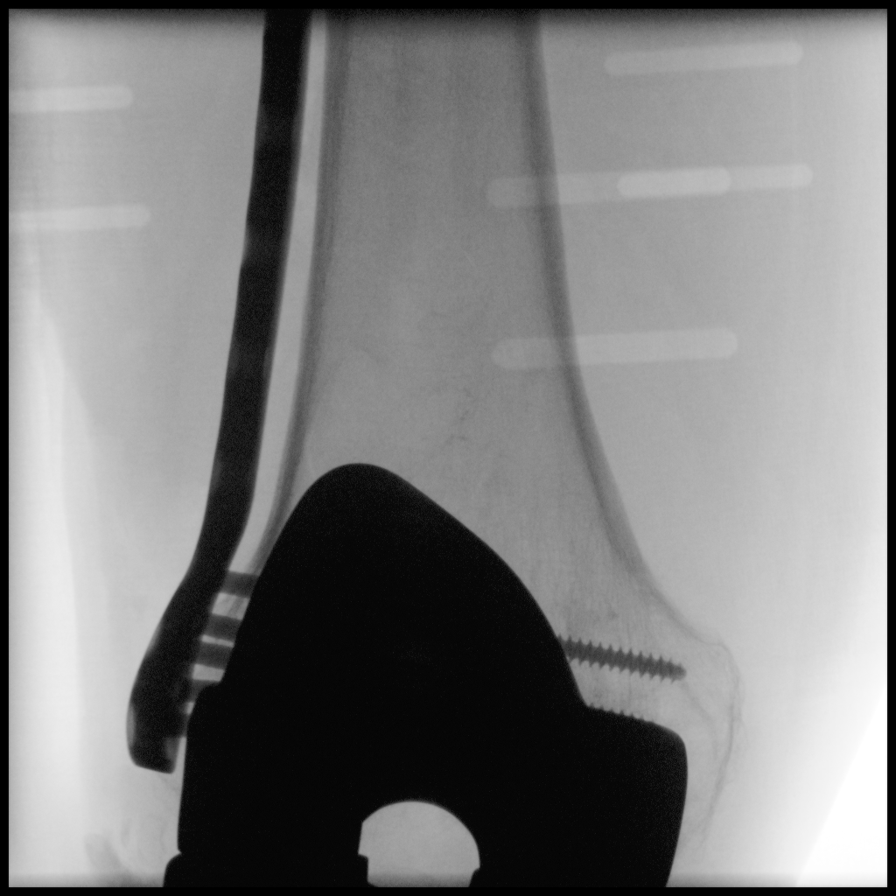

[Series 8: ortho standard · 1 of 1 slices shown (2 of 6)]
[im 1/1]
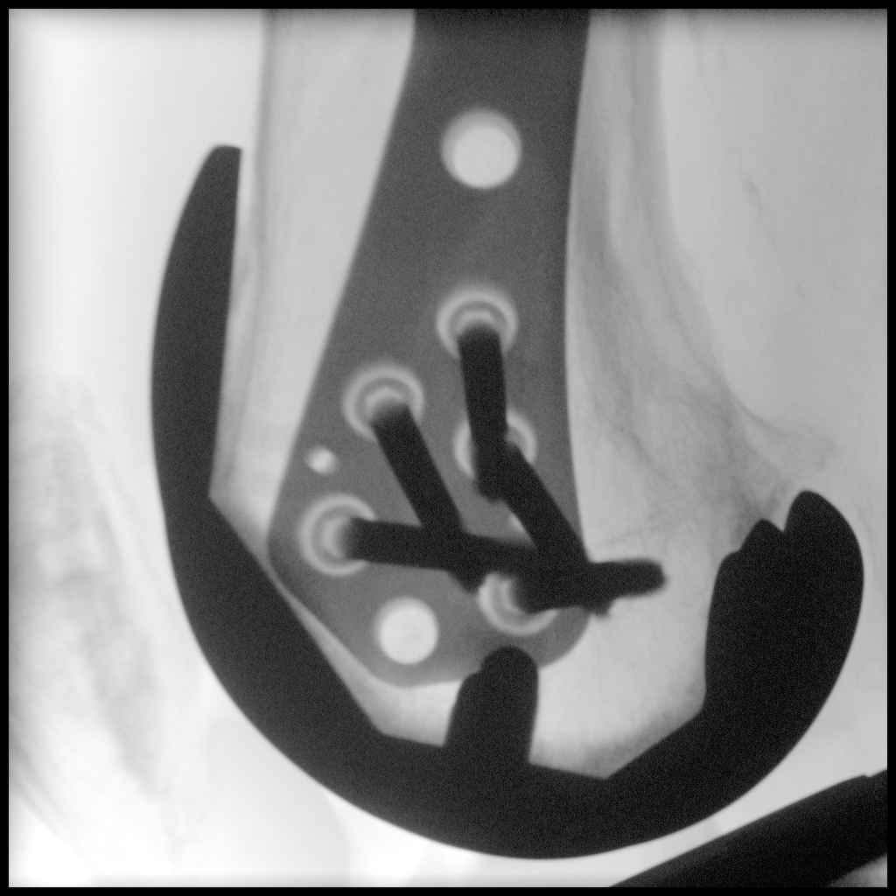

[Series 9: ortho standard · 1 of 1 slices shown (3 of 6)]
[im 1/1]
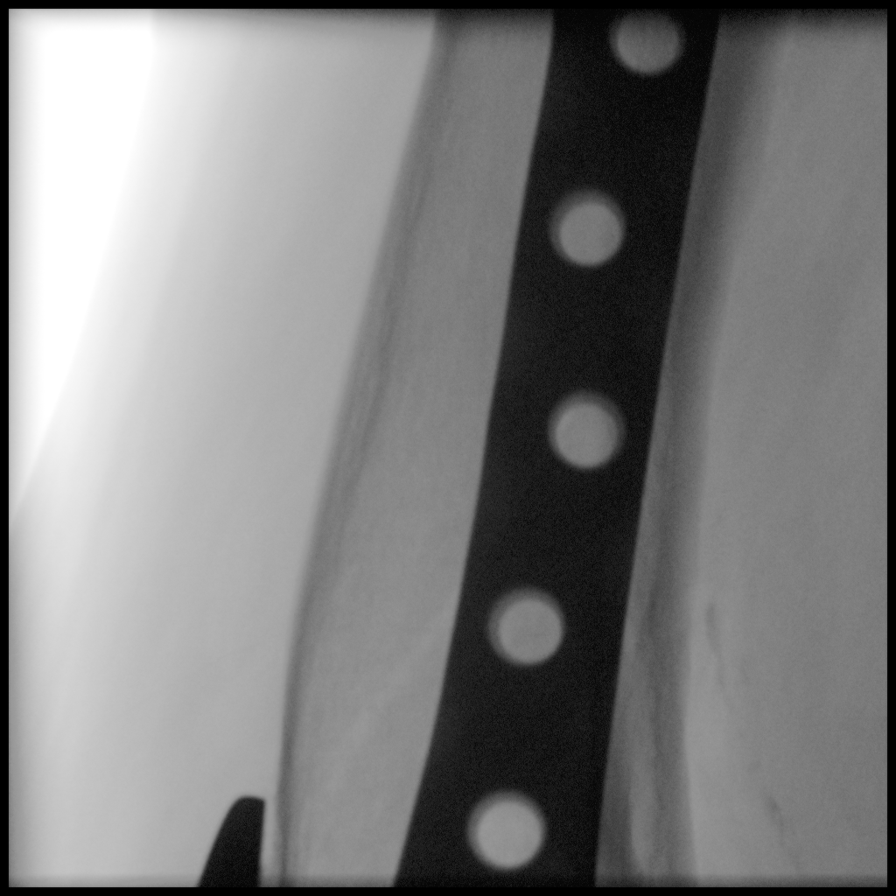

[Series 10: ortho standard · 1 of 1 slices shown (4 of 6)]
[im 1/1]
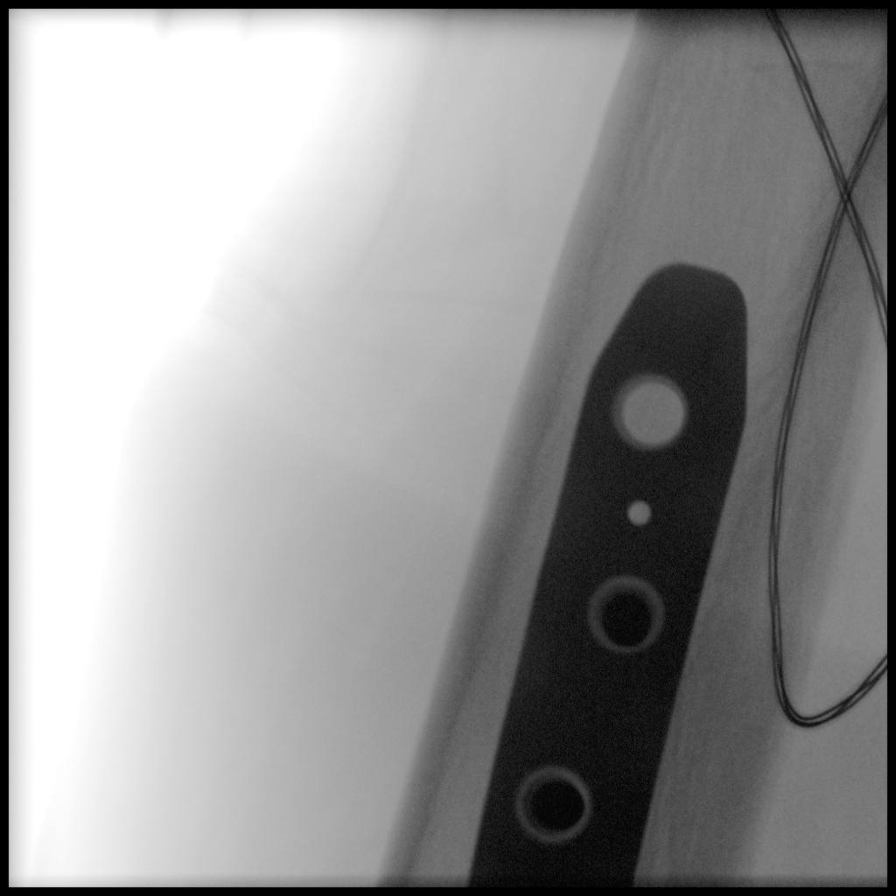

[Series 11: ortho standard · 1 of 1 slices shown (5 of 6)]
[im 1/1]
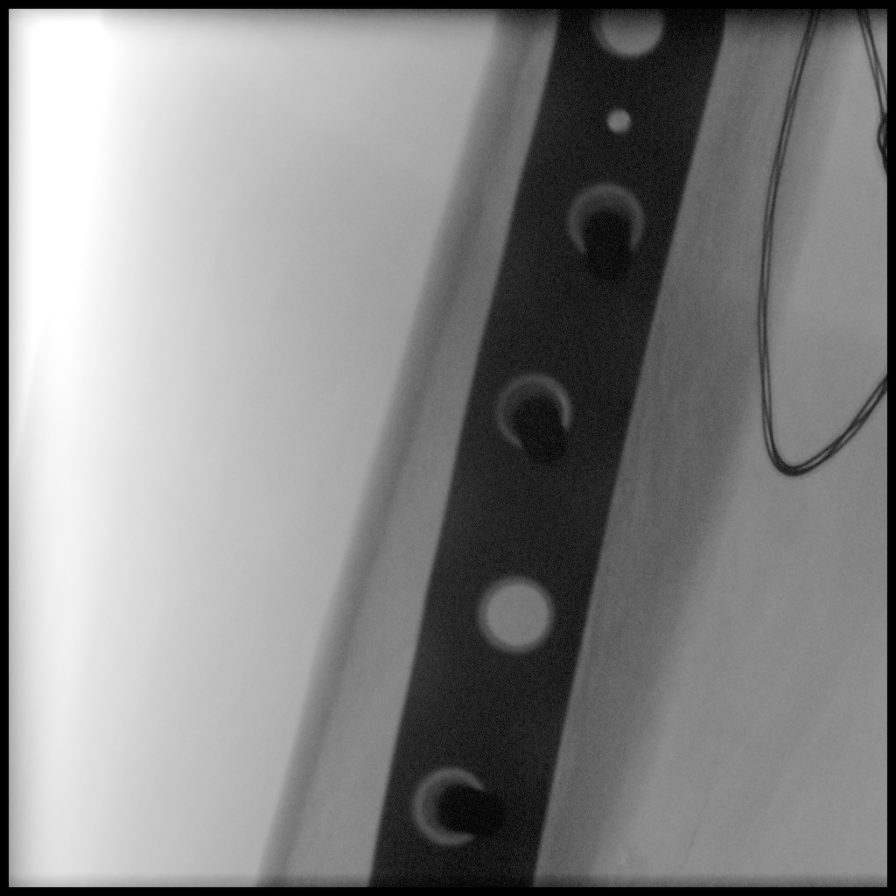

[Series 12: ortho standard · 1 of 1 slices shown (6 of 6)]
[im 1/1]
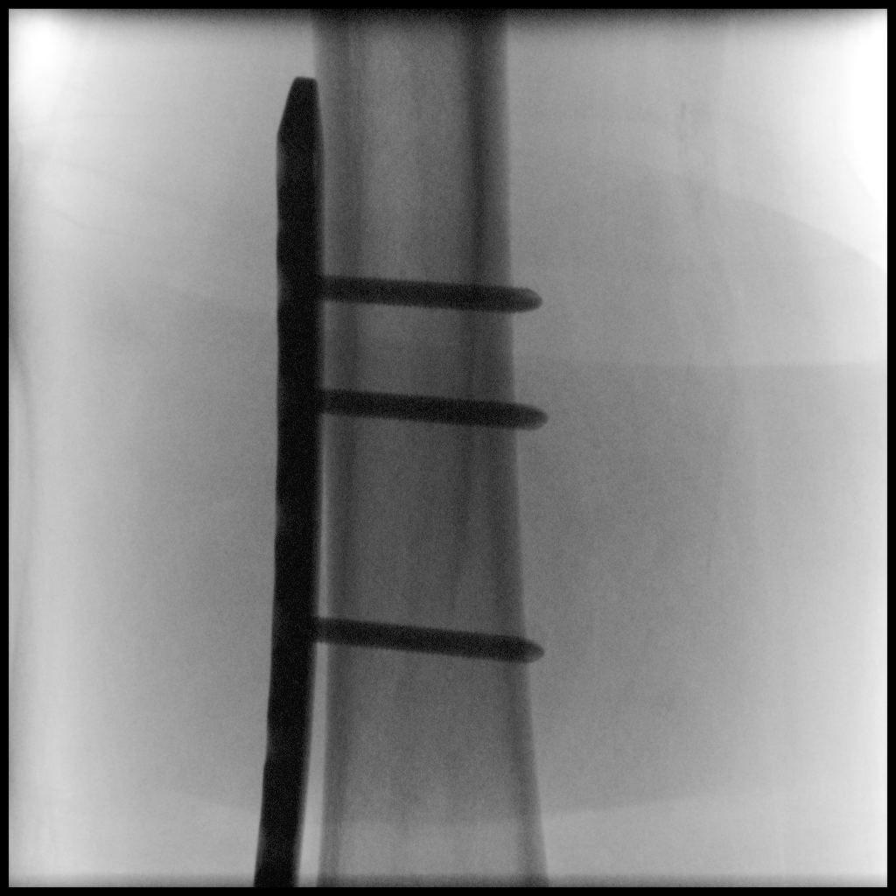

[6 of 6 positions shown; findings below may reference images not displayed]

FINDINGS: FLUORO TIME: 54 SECONDS.

Six C-arm fluoroscopic images were obtained intraoperatively and
submitted for post operative interpretation. These images
demonstrate open reduction internal fixation of a distal femur
fracture with plate and screws. Prior total knee arthroplasty,
partially imaged. Please see the performing provider's procedural
report for further detail.
IMPRESSION: Intraoperative fluoroscopic images, as detailed above.

## 2020-01-29 SURGERY — OPEN REDUCTION INTERNAL FIXATION (ORIF) DISTAL FEMUR FRACTURE
Anesthesia: General | Laterality: Right

## 2020-01-29 MED ORDER — METOPROLOL SUCCINATE ER 50 MG PO TB24: 50.0000 mg | ORAL_TABLET | Freq: Every day | ORAL | Status: DC

## 2020-01-29 MED ORDER — ONDANSETRON HCL 4 MG/2ML IJ SOLN
INTRAMUSCULAR | Status: DC | PRN
  Administered 2020-01-29: 4 mg via INTRAVENOUS

## 2020-01-29 MED ORDER — LIDOCAINE HCL (CARDIAC) PF 100 MG/5ML IV SOSY
PREFILLED_SYRINGE | INTRAVENOUS | Status: DC | PRN
  Administered 2020-01-29: 100 mg via INTRAVENOUS

## 2020-01-29 MED ORDER — SODIUM CHLORIDE 0.9 % IV SOLN: INTRAVENOUS | Status: DC

## 2020-01-29 MED ORDER — FISH OIL 1000 MG PO CAPS: 1000.0000 mg | ORAL_CAPSULE | Freq: Every day | ORAL | Status: DC

## 2020-01-29 MED ORDER — ROCURONIUM BROMIDE 100 MG/10ML IV SOLN
INTRAVENOUS | Status: DC | PRN
  Administered 2020-01-29: 50 mg via INTRAVENOUS
  Administered 2020-01-29: 10 mg via INTRAVENOUS

## 2020-01-29 MED ORDER — ACETAMINOPHEN 10 MG/ML IV SOLN
INTRAVENOUS | Status: DC | PRN
  Administered 2020-01-29: 500 mg via INTRAVENOUS

## 2020-01-29 MED ORDER — TRAMADOL HCL 50 MG PO TABS: 50.0000 mg | ORAL_TABLET | Freq: Four times a day (QID) | ORAL | Status: DC | PRN

## 2020-01-29 MED ORDER — DEXAMETHASONE SODIUM PHOSPHATE 10 MG/ML IJ SOLN
INTRAMUSCULAR | Status: DC | PRN
  Administered 2020-01-29: 10 mg via INTRAVENOUS

## 2020-01-29 MED ORDER — KETOROLAC TROMETHAMINE 15 MG/ML IJ SOLN
15.0000 mg | Freq: Once | INTRAMUSCULAR | Status: AC
  Administered 2020-01-29: 15 mg via INTRAVENOUS

## 2020-01-29 MED ORDER — ROCURONIUM BROMIDE 10 MG/ML (PF) SYRINGE
PREFILLED_SYRINGE | INTRAVENOUS | Status: AC
  Filled 2020-01-29: qty 10

## 2020-01-29 MED ORDER — METOCLOPRAMIDE HCL 10 MG PO TABS: 5.0000 mg | ORAL_TABLET | Freq: Three times a day (TID) | ORAL | Status: DC | PRN

## 2020-01-29 MED ORDER — ESMOLOL HCL 100 MG/10ML IV SOLN
INTRAVENOUS | Status: DC | PRN
  Administered 2020-01-29 (×2): 10 mg via INTRAVENOUS

## 2020-01-29 MED ORDER — GARLIC 1200 MG PO CAPS: 1200.0000 mg | ORAL_CAPSULE | Freq: Every day | ORAL | Status: DC

## 2020-01-29 MED ORDER — FLEET ENEMA 7-19 GM/118ML RE ENEM: 1.0000 | ENEMA | Freq: Once | RECTAL | Status: DC | PRN

## 2020-01-29 MED ORDER — FENTANYL CITRATE (PF) 100 MCG/2ML IJ SOLN
INTRAMUSCULAR | Status: AC
  Filled 2020-01-29: qty 2

## 2020-01-29 MED ORDER — OMEGA-3-ACID ETHYL ESTERS 1 G PO CAPS
1.0000 g | ORAL_CAPSULE | Freq: Every day | ORAL | Status: DC
  Administered 2020-01-30 – 2020-02-06 (×7): 1 g via ORAL
  Filled 2020-01-29 (×8): qty 1

## 2020-01-29 MED ORDER — FENTANYL CITRATE (PF) 100 MCG/2ML IJ SOLN: 25.0000 ug | INTRAMUSCULAR | Status: DC | PRN

## 2020-01-29 MED ORDER — KETAMINE HCL 50 MG/ML IJ SOLN
INTRAMUSCULAR | Status: DC | PRN
  Administered 2020-01-29: 30 mg via INTRAMUSCULAR

## 2020-01-29 MED ORDER — PANTOPRAZOLE SODIUM 40 MG PO TBEC
40.0000 mg | DELAYED_RELEASE_TABLET | Freq: Two times a day (BID) | ORAL | Status: DC
  Administered 2020-01-30 – 2020-02-06 (×15): 40 mg via ORAL
  Filled 2020-01-29 (×15): qty 1

## 2020-01-29 MED ORDER — OXYCODONE HCL 5 MG/5ML PO SOLN: 5.0000 mg | Freq: Once | ORAL | Status: DC | PRN

## 2020-01-29 MED ORDER — ALBUMIN HUMAN 5 % IV SOLN
INTRAVENOUS | Status: AC
  Filled 2020-01-29: qty 500

## 2020-01-29 MED ORDER — ALBUMIN HUMAN 5 % IV SOLN
12.5000 g | Freq: Once | INTRAVENOUS | Status: AC
  Administered 2020-01-29: 12.5 g via INTRAVENOUS
  Filled 2020-01-29: qty 250

## 2020-01-29 MED ORDER — LIDOCAINE HCL (PF) 2 % IJ SOLN
INTRAMUSCULAR | Status: AC
  Filled 2020-01-29: qty 5

## 2020-01-29 MED ORDER — SODIUM CHLORIDE 0.9 % IV SOLN
INTRAVENOUS | Status: DC | PRN
  Administered 2020-01-29: 50 ug/min via INTRAVENOUS
  Administered 2020-01-29: 30 ug/min via INTRAVENOUS

## 2020-01-29 MED ORDER — LACTATED RINGERS IV SOLN: INTRAVENOUS | Status: DC

## 2020-01-29 MED ORDER — PROPOFOL 10 MG/ML IV BOLUS
INTRAVENOUS | Status: DC | PRN
  Administered 2020-01-29: 90 mg via INTRAVENOUS

## 2020-01-29 MED ORDER — ONDANSETRON HCL 4 MG/2ML IJ SOLN: 4.0000 mg | Freq: Four times a day (QID) | INTRAMUSCULAR | Status: DC | PRN

## 2020-01-29 MED ORDER — MIDAZOLAM HCL 2 MG/2ML IJ SOLN
INTRAMUSCULAR | Status: DC | PRN
  Administered 2020-01-29: .5 mg via INTRAVENOUS

## 2020-01-29 MED ORDER — ACETAMINOPHEN 325 MG PO TABS: 325.0000 mg | ORAL_TABLET | Freq: Four times a day (QID) | ORAL | Status: DC | PRN

## 2020-01-29 MED ORDER — OXYCODONE HCL 5 MG PO TABS: 5.0000 mg | ORAL_TABLET | Freq: Once | ORAL | Status: DC | PRN

## 2020-01-29 MED ORDER — PHENYLEPHRINE HCL (PRESSORS) 10 MG/ML IV SOLN
INTRAVENOUS | Status: DC | PRN
  Administered 2020-01-29 (×3): 100 ug via INTRAVENOUS

## 2020-01-29 MED ORDER — FENTANYL CITRATE (PF) 100 MCG/2ML IJ SOLN
INTRAMUSCULAR | Status: DC | PRN
Start: 2020-01-29 — End: 2020-01-29
  Administered 2020-01-29 (×2): 50 ug via INTRAVENOUS

## 2020-01-29 MED ORDER — DOCUSATE SODIUM 100 MG PO CAPS
100.0000 mg | ORAL_CAPSULE | Freq: Two times a day (BID) | ORAL | Status: DC
  Administered 2020-01-29 – 2020-01-30 (×2): 100 mg via ORAL
  Filled 2020-01-29 (×10): qty 1

## 2020-01-29 MED ORDER — KETOROLAC TROMETHAMINE 15 MG/ML IJ SOLN
INTRAMUSCULAR | Status: AC
  Filled 2020-01-29: qty 1

## 2020-01-29 MED ORDER — DIPHENHYDRAMINE HCL 12.5 MG/5ML PO ELIX: 12.5000 mg | ORAL_SOLUTION | ORAL | Status: DC | PRN

## 2020-01-29 MED ORDER — KETAMINE HCL 50 MG/ML IJ SOLN
INTRAMUSCULAR | Status: AC
  Filled 2020-01-29: qty 10

## 2020-01-29 MED ORDER — ALBUMIN HUMAN 5 % IV SOLN: INTRAVENOUS | Status: DC | PRN

## 2020-01-29 MED ORDER — PROMETHAZINE HCL 25 MG/ML IJ SOLN: 6.2500 mg | INTRAMUSCULAR | Status: DC | PRN

## 2020-01-29 MED ORDER — ESMOLOL HCL 100 MG/10ML IV SOLN
INTRAVENOUS | Status: AC
  Filled 2020-01-29: qty 10

## 2020-01-29 MED ORDER — CEFAZOLIN SODIUM-DEXTROSE 2-4 GM/100ML-% IV SOLN
2.0000 g | Freq: Four times a day (QID) | INTRAVENOUS | Status: AC
  Administered 2020-01-29 – 2020-01-30 (×3): 2 g via INTRAVENOUS
  Filled 2020-01-29 (×4): qty 100

## 2020-01-29 MED ORDER — ONDANSETRON HCL 4 MG PO TABS: 4.0000 mg | ORAL_TABLET | Freq: Four times a day (QID) | ORAL | Status: DC | PRN

## 2020-01-29 MED ORDER — SODIUM CHLORIDE 0.9 % IV BOLUS
500.0000 mL | Freq: Once | INTRAVENOUS | Status: AC
  Administered 2020-01-29: 500 mL via INTRAVENOUS

## 2020-01-29 MED ORDER — DEXAMETHASONE SODIUM PHOSPHATE 10 MG/ML IJ SOLN
INTRAMUSCULAR | Status: AC
  Filled 2020-01-29: qty 1

## 2020-01-29 MED ORDER — ACETAMINOPHEN 10 MG/ML IV SOLN
INTRAVENOUS | Status: AC
  Filled 2020-01-29: qty 100

## 2020-01-29 MED ORDER — ADULT MULTIVITAMIN W/MINERALS CH
1.0000 | ORAL_TABLET | Freq: Every day | ORAL | Status: DC
  Administered 2020-01-30 – 2020-02-06 (×7): 1 via ORAL
  Filled 2020-01-29 (×8): qty 1

## 2020-01-29 MED ORDER — OXYCODONE HCL 5 MG PO TABS: 5.0000 mg | ORAL_TABLET | ORAL | Status: DC | PRN

## 2020-01-29 MED ORDER — MORPHINE SULFATE (PF) 2 MG/ML IV SOLN: 2.0000 mg | INTRAVENOUS | Status: DC | PRN

## 2020-01-29 MED ORDER — BISACODYL 10 MG RE SUPP: 10.0000 mg | Freq: Every day | RECTAL | Status: DC | PRN

## 2020-01-29 MED ORDER — ONDANSETRON HCL 4 MG/2ML IJ SOLN
INTRAMUSCULAR | Status: AC
  Filled 2020-01-29: qty 2

## 2020-01-29 MED ORDER — B COMPLEX-C PO TABS
1.0000 | ORAL_TABLET | Freq: Every day | ORAL | Status: DC
  Administered 2020-01-30 – 2020-02-05 (×6): 1 via ORAL
  Filled 2020-01-29 (×8): qty 1

## 2020-01-29 MED ORDER — SUGAMMADEX SODIUM 200 MG/2ML IV SOLN
INTRAVENOUS | Status: DC | PRN
  Administered 2020-01-29: 200 mg via INTRAVENOUS

## 2020-01-29 MED ORDER — APIXABAN 5 MG PO TABS: 5.0000 mg | ORAL_TABLET | Freq: Two times a day (BID) | ORAL | Status: DC

## 2020-01-29 MED ORDER — MAGNESIUM HYDROXIDE 400 MG/5ML PO SUSP: 30.0000 mL | Freq: Every day | ORAL | Status: DC | PRN

## 2020-01-29 MED ORDER — MEPERIDINE HCL 50 MG/ML IJ SOLN: 6.2500 mg | INTRAMUSCULAR | Status: DC | PRN

## 2020-01-29 MED ORDER — LIDOCAINE 5 % EX PTCH
1.0000 | MEDICATED_PATCH | CUTANEOUS | Status: DC
  Administered 2020-01-29 – 2020-01-30 (×2): 1 via TRANSDERMAL
  Filled 2020-01-29 (×10): qty 1

## 2020-01-29 MED ORDER — MIDAZOLAM HCL 2 MG/2ML IJ SOLN
INTRAMUSCULAR | Status: AC
  Filled 2020-01-29: qty 2

## 2020-01-29 MED ORDER — ACETAMINOPHEN 500 MG PO TABS
1000.0000 mg | ORAL_TABLET | Freq: Four times a day (QID) | ORAL | Status: AC
  Administered 2020-01-29 – 2020-01-30 (×3): 1000 mg via ORAL
  Filled 2020-01-29 (×5): qty 2

## 2020-01-29 MED ORDER — METOCLOPRAMIDE HCL 5 MG/ML IJ SOLN: 5.0000 mg | Freq: Three times a day (TID) | INTRAMUSCULAR | Status: DC | PRN

## 2020-01-29 MED ORDER — KETOROLAC TROMETHAMINE 15 MG/ML IJ SOLN
7.5000 mg | Freq: Four times a day (QID) | INTRAMUSCULAR | Status: AC
  Administered 2020-01-30 (×2): 7.5 mg via INTRAVENOUS
  Filled 2020-01-29 (×5): qty 1

## 2020-01-29 MED ORDER — CEFAZOLIN SODIUM-DEXTROSE 2-4 GM/100ML-% IV SOLN
INTRAVENOUS | Status: AC
  Filled 2020-01-29: qty 100

## 2020-01-29 MED ORDER — LISINOPRIL 5 MG PO TABS: 5.0000 mg | ORAL_TABLET | Freq: Every day | ORAL | Status: DC

## 2020-01-29 MED ORDER — ONDANSETRON HCL 4 MG/2ML IJ SOLN: 4.0000 mg | Freq: Once | INTRAMUSCULAR | Status: DC | PRN

## 2020-01-29 MED ORDER — APIXABAN 5 MG PO TABS
5.0000 mg | ORAL_TABLET | Freq: Two times a day (BID) | ORAL | Status: DC
  Administered 2020-01-30 – 2020-02-06 (×14): 5 mg via ORAL
  Filled 2020-01-29 (×14): qty 1

## 2020-01-29 SURGICAL SUPPLY — 55 items
BIT DRILL 4.3 (BIT) ×2
BIT DRILL 4.3MM (BIT) ×1
BIT DRILL 4.3X300MM (BIT) IMPLANT
BIT DRILL QC 3.3X195 (BIT) ×2 IMPLANT
BNDG COHESIVE 6X5 TAN STRL LF (GAUZE/BANDAGES/DRESSINGS) ×3 IMPLANT
BNDG ESMARK 6X12 TAN STRL LF (GAUZE/BANDAGES/DRESSINGS) ×2 IMPLANT
BRACE KNEE POST OP SHORT (BRACE) ×2 IMPLANT
CANISTER SUCT 1200ML W/VALVE (MISCELLANEOUS) ×3 IMPLANT
CAP LOCK NCB (Cap) ×10 IMPLANT
CHLORAPREP W/TINT 26 (MISCELLANEOUS) ×3 IMPLANT
COOLER POLAR GLACIER W/PUMP (MISCELLANEOUS) ×2 IMPLANT
COVER BACK TABLE REUSABLE LG (DRAPES) ×3 IMPLANT
COVER WAND RF STERILE (DRAPES) ×3 IMPLANT
CUFF TOURN SGL QUICK 24 (TOURNIQUET CUFF) ×2
CUFF TRNQT CYL 24X4X16.5-23 (TOURNIQUET CUFF) IMPLANT
DRAPE C-ARM XRAY 36X54 (DRAPES) ×3 IMPLANT
DRAPE C-ARMOR (DRAPES) ×3 IMPLANT
DRAPE INCISE IOBAN 66X60 STRL (DRAPES) ×6 IMPLANT
DRAPE SPLIT 6X30 W/TAPE (DRAPES) ×6 IMPLANT
DRAPE U-SHAPE 47X51 STRL (DRAPES) ×3 IMPLANT
DRSG OPSITE POSTOP 4X12 (GAUZE/BANDAGES/DRESSINGS) ×2 IMPLANT
DRSG OPSITE POSTOP 4X6 (GAUZE/BANDAGES/DRESSINGS) ×3 IMPLANT
DRSG OPSITE POSTOP 4X8 (GAUZE/BANDAGES/DRESSINGS) IMPLANT
ELECT REM PT RETURN 9FT ADLT (ELECTROSURGICAL) ×3
ELECTRODE REM PT RTRN 9FT ADLT (ELECTROSURGICAL) ×1 IMPLANT
GAUZE SPONGE 4X4 12PLY STRL (GAUZE/BANDAGES/DRESSINGS) ×3 IMPLANT
GAUZE XEROFORM 1X8 LF (GAUZE/BANDAGES/DRESSINGS) ×5 IMPLANT
GLOVE BIO SURGEON STRL SZ8 (GLOVE) ×6 IMPLANT
GLOVE INDICATOR 8.0 STRL GRN (GLOVE) ×3 IMPLANT
GOWN STRL REUS W/ TWL LRG LVL3 (GOWN DISPOSABLE) ×1 IMPLANT
GOWN STRL REUS W/ TWL XL LVL3 (GOWN DISPOSABLE) ×1 IMPLANT
GOWN STRL REUS W/TWL LRG LVL3 (GOWN DISPOSABLE) ×2
GOWN STRL REUS W/TWL XL LVL3 (GOWN DISPOSABLE) ×2
K-WIRE 2.0 (WIRE) ×4
K-WIRE FXSTD 280X2XNS SS (WIRE) ×2
KIT TURNOVER KIT A (KITS) ×3 IMPLANT
KWIRE FXSTD 280X2XNS SS (WIRE) IMPLANT
NS IRRIG 1000ML POUR BTL (IV SOLUTION) ×3 IMPLANT
PACK HIP PROSTHESIS (MISCELLANEOUS) ×3 IMPLANT
PAD WRAPON POLAR KNEE (MISCELLANEOUS) IMPLANT
PLATE FEMUR RIGHT 9H (Plate) ×2 IMPLANT
SCREW CANCELLOUS 5.0X85 (Screw) ×4 IMPLANT
SCREW CANCELLOUS 5.0X90 (Screw) ×4 IMPLANT
SCREW CANNCELLOUS5.0X55 (Screw) IMPLANT
SCREW CORT NCB SELFTAP 5.0X42 (Screw) ×4 IMPLANT
SCREW CORTICAL NCB 5.0X44 (Screw) ×4 IMPLANT
SPONGE LAP 18X18 RF (DISPOSABLE) ×3 IMPLANT
STAPLER SKIN PROX 35W (STAPLE) ×3 IMPLANT
STOCKINETTE M/LG 89821 (MISCELLANEOUS) ×3 IMPLANT
SUT VIC AB 0 CT1 36 (SUTURE) ×2 IMPLANT
SUT VIC AB 1 CT1 36 (SUTURE) ×2 IMPLANT
SUT VIC AB 2-0 CT1 (SUTURE) ×6 IMPLANT
SUT VIC AB 2-0 CT1 27 (SUTURE) ×4
SUT VIC AB 2-0 CT1 TAPERPNT 27 (SUTURE) ×2 IMPLANT
WRAPON POLAR PAD KNEE (MISCELLANEOUS) ×3

## 2020-01-29 NOTE — Progress Notes (Signed)
IS education attempted - pt refused stated "I know how to use"

## 2020-01-29 NOTE — H&P (Signed)
The patient's consult note and H&P were reviewed and the patient re-examined. No changes are noted.

## 2020-01-29 NOTE — Anesthesia Procedure Notes (Signed)
Procedure Name: Intubation Date/Time: 01/29/2020 2:15 PM Performed by: Joanette Gula, Makani Seckman, CRNA Pre-anesthesia Checklist: Patient identified, Emergency Drugs available, Suction available and Patient being monitored Patient Re-evaluated:Patient Re-evaluated prior to induction Oxygen Delivery Method: Circle system utilized Preoxygenation: Pre-oxygenation with 100% oxygen Induction Type: IV induction Ventilation: Mask ventilation without difficulty Laryngoscope Size: McGraph and 4 Grade View: Grade I Tube type: Oral Tube size: 7.5 mm Number of attempts: 1 Airway Equipment and Method: Stylet and Oral airway Placement Confirmation: ETT inserted through vocal cords under direct vision,  positive ETCO2 and breath sounds checked- equal and bilateral Secured at: 22 cm Tube secured with: Tape Dental Injury: Teeth and Oropharynx as per pre-operative assessment

## 2020-01-29 NOTE — Op Note (Signed)
01/25/2020 - 01/29/2020  3:59 PM  Patient:   James Sanford  Pre-Op Diagnosis:   Closed minimally displaced periprosthetic right supracondylar femur fracture.  Post-Op Diagnosis:   Same  Procedure:   Open reduction and internal fixation of minimally displaced periprosthetic right supracondylar femur fracture.  Surgeon:   Maryagnes Amos, MD  Assistant:   Horris Latino, PA-C; Orvan July, PA-S  Anesthesia:   GET  Findings:   As above.  Complications:   None  Fluids:   500 cc crystalloid, 500 cc Hespan  EBL:   25 cc  UOP:   None  TT:   90 minutes at 300 mmHg  Drains:   None  Closure:   Staples  Implants:   [employer] 9-hole precontoured supracondylar femoral plate with 5 distal screws with locking caps and 3 bicortical proximal screws  Brief Clinical Note:   The patient is a 64 year old male who sustained the above-noted injury 3 to 4 days ago when he apparently fell while inebriated. He presented to the emergency room shortly thereafter with hematemesis and hematochezia. X-rays of the right knee upon admission to the hospital demonstrated the above-noted fracture. The patient has been cleared medically and presents at this time for definitive management of his injury.  Procedure:   The patient was brought into the operating room and lain in the supine position. After adequate general endotracheal intubation and anesthesia were obtained, the patient's right lower extremity was prepped with ChloraPrep solution before being draped sterilely. Preoperative antibiotics were administered. A timeout was performed to verify the appropriate surgical site before a sterile tourniquet was applied to the upper thigh. The limb was exsanguinated with an Esmarch and the tourniquet inflated to 300 mmHg.    An approximately 8 to 10 cm incision was made over the lateral aspect of the distal femur. The incision was carried down through the subcutaneous tissues to expose the iliotibial  band. This was split the length of the incision to expose the lateral aspect of the lateral femoral condyle. A 9-hole [employer] precontoured supracondylar femoral plate was selected. The plate was carefully positioned utilizing fluoroscopic imaging in AP and lateral projections before it was temporarily secured using K wires proximally and distally.    A bicortical screw was placed proximal to the fracture site to help reduce the plate to the femoral shaft.  Distally, the plate was secured using five partially threaded cancellus screws. The adequacy of screw position was verified fluoroscopically in AP and lateral projections and found to be excellent. Each of the screws was covered with a locking cap.  Proximally, the plate was secured using three bicortical screws of appropriate length.  These screws were placed through stab incisions.  Again the adequacy of hardware position and overall femoral alignment/fracture reduction was verified fluoroscopically in AP and lateral projections and found to be excellent.  The wounds were copiously irrigated with bacitracin saline solution using bulb irrigation before the iliotibial band was reapproximated using #1 Vicryl interrupted sutures. The subcutaneous tissues were closed in two layers using 2-0 Vicryl interrupted sutures before the skin was closed with staples. A total of 30 cc of 0.5% Sensorcaine with epinephrine was injected in around the incision sites to help with postoperative analgesia before a sterile occlusive dressing was applied to the thigh. The patient was placed into a hinged knee brace with hinges set at 0 to 90 degrees but locked in extension. The patient was then awakened, extubated, and returned to the recovery room  in satisfactory condition after tolerating the procedure well.

## 2020-01-29 NOTE — Anesthesia Preprocedure Evaluation (Signed)
Anesthesia Evaluation  Patient identified by MRN, date of birth, ID band Patient awake    Reviewed: Allergy & Precautions, NPO status , Patient's Chart, lab work & pertinent test results  History of Anesthesia Complications Negative for: history of anesthetic complications  Airway Mallampati: II  TM Distance: >3 FB Neck ROM: Full    Dental  (+) Edentulous Upper, Edentulous Lower   Pulmonary neg pulmonary ROS, Sleep apnea: hx of sleep apnea prior to weight loss. , neg COPD,    breath sounds clear to auscultation- rhonchi (-) wheezing      Cardiovascular hypertension, Pt. on medications (-) CAD, (-) Past MI, (-) Cardiac Stents and (-) CABG  Rhythm:Regular Rate:Normal - Systolic murmurs and - Diastolic murmurs    Neuro/Psych neg Seizures PSYCHIATRIC DISORDERS Depression negative neurological ROS     GI/Hepatic Neg liver ROS, GIB, s/p endoscopy/colonoscopy with no identified source, hgb stable   Endo/Other  diabetes  Renal/GU negative Renal ROS     Musculoskeletal  (+) Arthritis ,   Abdominal (+) - obese,   Peds  Hematology negative hematology ROS (+)   Anesthesia Other Findings Past Medical History: No date: Arthritis No date: Diabetes (HCC) No date: Hyperlipemia No date: Hypertension No date: Sleep apnea   Reproductive/Obstetrics                             Anesthesia Physical Anesthesia Plan  ASA: III  Anesthesia Plan: General   Post-op Pain Management:    Induction: Intravenous  PONV Risk Score and Plan: 1 and Ondansetron and Dexamethasone  Airway Management Planned: Oral ETT  Additional Equipment:   Intra-op Plan:   Post-operative Plan: Extubation in OR  Informed Consent: I have reviewed the patients History and Physical, chart, labs and discussed the procedure including the risks, benefits and alternatives for the proposed anesthesia with the patient or authorized  representative who has indicated his/her understanding and acceptance.     Dental advisory given  Plan Discussed with: CRNA and Anesthesiologist  Anesthesia Plan Comments:         Anesthesia Quick Evaluation

## 2020-01-29 NOTE — Transfer of Care (Signed)
Immediate Anesthesia Transfer of Care Note  Patient: James Sanford  Procedure(s) Performed: OPEN REDUCTION INTERNAL FIXATION (ORIF) DISTAL FEMUR FRACTURE (Right )  Patient Location: PACU  Anesthesia Type:General  Level of Consciousness: drowsy  Airway & Oxygen Therapy: Patient Spontanous Breathing and Patient connected to face mask oxygen  Post-op Assessment: Report given to RN  Post vital signs: stable  Last Vitals:  Vitals Value Taken Time  BP 86/61 01/29/20 1627  Temp    Pulse 83 01/29/20 1629  Resp 17 01/29/20 1629  SpO2 100 % 01/29/20 1629  Vitals shown include unvalidated device data.  Last Pain:  Vitals:   01/29/20 1309  TempSrc: Temporal  PainSc: 0-No pain         Complications: No complications documented.

## 2020-01-29 NOTE — Plan of Care (Signed)
  Problem: Education: Goal: Knowledge of General Education information will improve Description Including pain rating scale, medication(s)/side effects and non-pharmacologic comfort measures Outcome: Progressing   Problem: Health Behavior/Discharge Planning: Goal: Ability to manage health-related needs will improve Outcome: Progressing   

## 2020-01-29 NOTE — Progress Notes (Signed)
[facility] Progress Note  Patient: James Sanford    BJY:[id number]  DOA: 01/25/2020     Date of Service: the patient was seen and examined on 01/29/2020  Brief hospital course: Past medical history of HTN, A. fib, OSA, HLD, type II DM, alcohol abuse.  Presents with GI bleed history. SP EGD.  Poor peripheral p.o. but without 4 or multiple rehab comfort again tomorrow. Currently plan is supportive measures for bleeding and awaiting results for colonoscopy.  Assessment and Plan: 1.  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 Upper GI endoscopy negative for any acute bleeding. Currently on Protonix.  Switch from IV to p.o. Octreotide was initiated but discontinued now. Appreciate GI consultation. EGD shows no evidence of acute bleeding.  No evidence of varices. Colonoscopy was performed twice.  Prep was poor twice.  No acute bleeding reported.  No acute intervention was performed other than biopsy of the polyp. GI recommend to resume Eliquis.  Resume on 01/30/2020. Monitor H&H and transfuse hemoglobin less than 7.  2.  Alcohol abuse No evidence of withdrawal for now. Continue CIWA protocol for now.  3.  Type 2 diabetes mellitus, controlled. Continue sliding scale insulin for now.  4.  Paroxysmal A. fib On Eliquis. Rate currently controlled. Initially holding Eliquis for GI bleeding. Now holding Eliquis for orthopedic procedure scheduled tomorrow.  5.  Chronic right knee pain with acute component. X-ray actually shows periprosthetic fracture. Orthopedic consulted. Scheduled for ORIF tomorrow. No evidence of acute injury from the outside.  6.  Thrombocytopenia, chronic, improving Mild worsening right now likely in the setting of PRBC transfusion. In the setting of alcohol abuse. Monitor for now.  Supportive measures  7.  Emphysema Emphysematous changes Seen on CT scan. Likely patient has COPD.  Outpatient work-up recommended.  8.  Depression. Concern for  suicidal ideation. Psychiatry was consulted.  Patient does not have suicidal ideation right now. Does not meet inpatient psychiatric criteria. Discontinue sitter.  9.  Hypotension. We will provide IV fluids. Preoperatively patient was also hypotensive.  Discontinue blood pressure medication. IV albumin x1. Lactic acid and CBC currently pending.  10.  Leukocytosis. Likely stress reaction. Monitor.  Patient does not have any acute complaints.  Diet: Regular diet.  DVT Prophylaxis:   Place TED hose Start: 01/29/20 1724 Foot Pump / plexipulse Start: 01/29/20 1724    Advance goals of care discussion: Full code  Family Communication: no family was present at bedside, at the time of interview.   Disposition:  Status is: Inpatient  Remains inpatient appropriate because:Ongoing diagnostic testing needed not appropriate for outpatient work up   Dispo: The patient is from: Home              Anticipated d/c is to: Home              Anticipated d/c date is: 2 days              Patient currently is not medically stable to d/c.  Subjective: No nausea no vomiting.  No fever no chills.  No chest pain.  No abdominal pain.  No cough.  No diarrhea.  No constipation.  No bleeding.  Physical Exam:  General: Appear in mild distress, no Rash; Oral Mucosa Clear, moist. no Abnormal Neck Mass Or lumps, Conjunctiva normal  Cardiovascular: S1 and S2 Present, no Murmur, Respiratory: good respiratory effort, Bilateral Air entry present and CTA, no Crackles, no wheezes Abdomen: Bowel Sound present, Soft and no tenderness Extremities: no Pedal edema Neurology: alert  and oriented to time, place, and person affect appropriate. no new focal deficit Gait not checked due to patient safety concerns   Vitals:   01/29/20 1651 01/29/20 1658 01/29/20 1720 01/29/20 1752  BP: 103/71 97/61 (!) 81/56 98/70  Pulse: 79 77 71 88  Resp: 18 18  19   Temp:  97.9 F (36.6 C) 98.4 F (36.9 C)   TempSrc:   Oral     SpO2: 93% 96%  94%  Weight:      Height:        Intake/Output Summary (Last 24 hours) at 01/29/2020 1828 Last data filed at 01/29/2020 1700 Gross per 24 hour  Intake 1200 ml  Output 925 ml  Net 275 ml   Filed Weights   01/25/20 1526 01/27/20 1034  Weight: 81.6 kg 85.3 kg    Data Reviewed: I have personally reviewed and interpreted daily labs, tele strips, imagings as discussed above. I reviewed all nursing notes, pharmacy notes, vitals, pertinent old records I have discussed plan of care as described above with RN and patient/family.  CBC: Recent Labs  Lab 01/25/20 1543 01/25/20 1754 01/25/20 2204 01/26/20 0421 01/26/20 1751 01/26/20 1751 01/27/20 0901 01/27/20 0901 01/27/20 1254 01/27/20 1929 01/28/20 0313 01/28/20 1402 01/29/20 1118  WBC 10.2   < > 8.5   < > 7.4  --  4.9  --   --  8.3  --  6.5 17.2*  NEUTROABS 9.0*  --  6.9  --   --   --  3.8  --   --  6.4  --   --   --   HGB 8.7*   < > 8.4*   < > 8.3*   < > 7.7*   < > 7.7* 8.0* 7.4* 8.4* 8.4*  HCT 25.7*   < > 25.0*   < > 25.5*   < > 23.1*   < > 23.7* 24.4* 22.8* 25.7* 25.2*  MCV 101.2*   < > 100.4*   < > 104.5*  --  102.2*  --   --  104.7*  --  106.2* 104.1*  PLT 123*   < > 114*   < > 102*  --  85*  --   --  104*  --  103* 96*   < > = values in this interval not displayed.   Basic Metabolic Panel: Recent Labs  Lab 01/25/20 1543 01/26/20 0421 01/27/20 0901 01/28/20 0313 01/29/20 1118  NA 139 140 139 140 141  K 4.6 4.0 3.7 3.8 4.0  CL 109 111 108 110 108  CO2 23 24 26 25 25   GLUCOSE 147* 156* 138* 101* 123*  BUN 19 18 9  7* 16  CREATININE 0.55* 0.67 0.72 0.70 0.95  CALCIUM 8.0* 7.7* 7.5* 7.7* 7.5*  MG  --   --  2.5* 2.2  --     Studies: DG C-Arm 1-60 Min  Result Date: 01/29/2020 CLINICAL DATA:  Distal femur ORIF EXAM: RIGHT FEMUR 2 VIEWS; DG C-ARM 1-60 MIN COMPARISON:  None. FINDINGS: FLUORO TIME: 54 SECONDS. Six C-arm fluoroscopic images were obtained intraoperatively and submitted for post  operative interpretation. These images demonstrate open reduction internal fixation of a distal femur fracture with plate and screws. Prior total knee arthroplasty, partially imaged. Please see the performing provider's procedural report for further detail. IMPRESSION: Intraoperative fluoroscopic images, as detailed above. Electronically Signed   By: MD   On: 01/29/2020 16:22   DG FEMUR, MIN 2 VIEWS RIGHT  Result  Date: 01/29/2020 CLINICAL DATA:  Distal femur ORIF EXAM: RIGHT FEMUR 2 VIEWS; DG C-ARM 1-60 MIN COMPARISON:  None. FINDINGS: FLUORO TIME: 54 SECONDS. Six C-arm fluoroscopic images were obtained intraoperatively and submitted for post operative interpretation. These images demonstrate open reduction internal fixation of a distal femur fracture with plate and screws. Prior total knee arthroplasty, partially imaged. Please see the performing provider's procedural report for further detail. IMPRESSION: Intraoperative fluoroscopic images, as detailed above. Electronically Signed   By: Feliberto Harts MD   On: 01/29/2020 16:22    Scheduled Meds: . acetaminophen  1,000 mg Oral Q6H  . [START ON 01/30/2020] apixaban  5 mg Oral BID  . [START ON 01/30/2020] B-complex with vitamin C  1 tablet Oral Daily  . docusate sodium  100 mg Oral BID  . folic acid  1 mg Oral Daily  . ketorolac  7.5 mg Intravenous Q6H  . ketorolac      . [START ON 01/30/2020] multivitamin with minerals  1 tablet Oral Daily  . [START ON 01/30/2020] omega-3 acid ethyl esters  1 g Oral Daily  . [START ON 01/30/2020] pantoprazole  40 mg Oral BID AC  . thiamine  100 mg Oral Daily   Or  . thiamine  100 mg Intravenous Daily  . vitamin B-12  1,000 mcg Oral Daily   Continuous Infusions: . albumin human    .  ceFAZolin (ANCEF) IV    . lactated ringers     PRN Meds: [START ON 01/30/2020] acetaminophen, bisacodyl, diphenhydrAMINE, magnesium hydroxide, metoCLOPramide **OR** metoCLOPramide (REGLAN) injection,  morphine injection, ondansetron **OR** ondansetron (ZOFRAN) IV, oxyCODONE, sodium phosphate, traMADol  Time spent: 35 minutes  Author: Lynden Oxford, MD [facility] 01/29/2020 6:28 PM  To reach On-call, see care teams to locate the attending and reach out via [email or web address]. Between 7PM-7AM, please contact night-coverage If you still have difficulty reaching the attending provider, please page the [facility] (Director on Call) for [facility] on amion for assistance.

## 2020-01-30 ENCOUNTER — Encounter: Payer: Self-pay | Admitting: Gastroenterology

## 2020-01-30 DIAGNOSIS — E785 Hyperlipidemia, unspecified: Secondary | ICD-10-CM | POA: Diagnosis not present

## 2020-01-30 DIAGNOSIS — I480 Paroxysmal atrial fibrillation: Secondary | ICD-10-CM | POA: Diagnosis not present

## 2020-01-30 DIAGNOSIS — K922 Gastrointestinal hemorrhage, unspecified: Secondary | ICD-10-CM | POA: Diagnosis not present

## 2020-01-30 DIAGNOSIS — F1010 Alcohol abuse, uncomplicated: Secondary | ICD-10-CM | POA: Diagnosis not present

## 2020-01-30 LAB — CBC WITH DIFFERENTIAL/PLATELET
Abs Immature Granulocytes: 0.14 10*3/uL — ABNORMAL HIGH (ref 0.00–0.07)
Basophils Absolute: 0 10*3/uL (ref 0.0–0.1)
Basophils Relative: 0 %
Eosinophils Absolute: 0 10*3/uL (ref 0.0–0.5)
Eosinophils Relative: 0 %
HCT: 24.5 % — ABNORMAL LOW (ref 39.0–52.0)
Hemoglobin: 7.9 g/dL — ABNORMAL LOW (ref 13.0–17.0)
Immature Granulocytes: 1 %
Lymphocytes Relative: 3 %
Lymphs Abs: 0.5 10*3/uL — ABNORMAL LOW (ref 0.7–4.0)
MCH: 34.2 pg — ABNORMAL HIGH (ref 26.0–34.0)
MCHC: 32.2 g/dL (ref 30.0–36.0)
MCV: 106.1 fL — ABNORMAL HIGH (ref 80.0–100.0)
Monocytes Absolute: 0.8 10*3/uL (ref 0.1–1.0)
Monocytes Relative: 5 %
Neutro Abs: 13.9 10*3/uL — ABNORMAL HIGH (ref 1.7–7.7)
Neutrophils Relative %: 91 %
Platelets: 95 10*3/uL — ABNORMAL LOW (ref 150–400)
RBC: 2.31 MIL/uL — ABNORMAL LOW (ref 4.22–5.81)
RDW: 14.3 % (ref 11.5–15.5)
Smear Review: NORMAL
WBC: 15.3 10*3/uL — ABNORMAL HIGH (ref 4.0–10.5)
nRBC: 0 % (ref 0.0–0.2)

## 2020-01-30 LAB — GLUCOSE, CAPILLARY
Glucose-Capillary: 128 mg/dL — ABNORMAL HIGH (ref 70–99)
Glucose-Capillary: 130 mg/dL — ABNORMAL HIGH (ref 70–99)
Glucose-Capillary: 118 mg/dL — ABNORMAL HIGH (ref 70–99)
Glucose-Capillary: 115 mg/dL — ABNORMAL HIGH (ref 70–99)

## 2020-01-30 LAB — COMPREHENSIVE METABOLIC PANEL
ALT: 16 U/L (ref 0–44)
AST: 24 U/L (ref 15–41)
Albumin: 2.5 g/dL — ABNORMAL LOW (ref 3.5–5.0)
Alkaline Phosphatase: 72 U/L (ref 38–126)
Anion gap: 6 (ref 5–15)
BUN: 25 mg/dL — ABNORMAL HIGH (ref 8–23)
CO2: 25 mmol/L (ref 22–32)
Calcium: 7.5 mg/dL — ABNORMAL LOW (ref 8.9–10.3)
Chloride: 108 mmol/L (ref 98–111)
Creatinine, Ser: 1 mg/dL (ref 0.61–1.24)
GFR, Estimated: >60 mL/min (ref >60)
Glucose, Bld: 156 mg/dL — ABNORMAL HIGH (ref 70–99)
Potassium: 4.6 mmol/L (ref 3.5–5.1)
Sodium: 139 mmol/L (ref 135–145)
Total Bilirubin: 0.8 mg/dL (ref 0.3–1.2)
Total Protein: 4.9 g/dL — ABNORMAL LOW (ref 6.5–8.1)

## 2020-01-30 LAB — SURGICAL PATHOLOGY

## 2020-01-30 LAB — MAGNESIUM: Magnesium: 2.2 mg/dL (ref 1.7–2.4)

## 2020-01-30 NOTE — Progress Notes (Addendum)
[facility] Progress Note  Patient: James Sanford    XTG:[id number]  DOA: 01/25/2020     Date of Service: the patient was seen and examined on 01/30/2020  Brief hospital course: Past medical history of HTN, A. fib, OSA, HLD, type II DM, alcohol abuse.  Presents with GI bleed history. SP EGD.    Assessment and Plan: 1.  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 Upper GI endoscopy negative for any acute bleeding. Currently on Protonix.  Switch from IV to p.o. Octreotide was initiated but discontinued now. Appreciate GI consultation. EGD shows no evidence of acute bleeding.  No evidence of varices. Colonoscopy was performed twice.  Prep was poor twice.  No acute bleeding reported.  No acute intervention was performed other than biopsy of the polyp. GI recommend to resume Eliquis.  Resume on 01/30/2020. Monitor H&H and transfuse hemoglobin less than 7.  2.  Alcohol abuse No evidence of withdrawal for now. Continue CIWA protocol for now.  3.  Type 2 diabetes mellitus, controlled. Continue sliding scale insulin for now.  4.  Paroxysmal A. fib On Eliquis. Rate currently controlled.  5.  Closed minimally displaced periprosthetic right supracondylar femur fracture X-ray actually shows periprosthetic fracture. Status post ORIF by Ortho on 10/26  6.  Thrombocytopenia, chronic, improving Mild worsening right now likely in the setting of PRBC transfusion. In the setting of alcohol abuse. Monitor for now.  Supportive measures  7.  Emphysema Emphysematous changes Seen on CT scan. Likely patient has COPD.  Outpatient work-up recommended.  8.  Depression. Concern for suicidal ideation. Psychiatry was consulted.  Patient does not have suicidal ideation right now. Does not meet inpatient psychiatric criteria. Discontinue sitter.  9.  Hypotension. Resolved now  10.  Leukocytosis. Likely stress reaction. Monitor.  Patient does not have any acute complaints.  Diet:  Regular diet.  DVT Prophylaxis:   Place TED hose Start: 01/29/20 1724 Foot Pump / plexipulse Start: 01/29/20 1724    Advance goals of care discussion: Full code  Family Communication: None today  Disposition:  Status is: Inpatient  Remains inpatient appropriate because:Ongoing diagnostic testing needed not appropriate for outpatient work up   Dispo: The patient is from: Home              Anticipated d/c is to: Home              Anticipated d/c date is: 2 days              Patient currently is not medically stable to d/c.  Waiting for insurance authorization for SNF placement  Subjective: Feels like he is in hell and wants to get out of here.  He explicitly states not wanting to go to [employer] [facility].  Physical Exam:  General: Appear in mild distress, no Rash; Oral Mucosa Clear, moist. no Abnormal Neck Mass Or lumps, Conjunctiva normal  Cardiovascular: S1 and S2 Present, no Murmur, Respiratory: good respiratory effort, Bilateral Air entry present and CTA, no Crackles, no wheezes Abdomen: Bowel Sound present, Soft and no tenderness Extremities: no Pedal edema, blood tinged drainage  Neurology: alert and oriented to time, place, and person affect appropriate. no new focal deficit Gait not checked due to patient safety concerns   Vitals:   01/30/20 0454 01/30/20 0728 01/30/20 1059 01/30/20 1535  BP: 102/76 112/83 103/73 104/75  Pulse: 69 66 81 78  Resp: 16 16 19 18   Temp: 98.4 F (36.9 C) 97.8 F (36.6 C) 97.8 F (36.6 C) 98.2 F (  36.8 C)  TempSrc:  Oral Oral Oral  SpO2: 97% 98% 99% 96%  Weight:      Height:        Intake/Output Summary (Last 24 hours) at 01/30/2020 1659 Last data filed at 01/30/2020 1300 Gross per 24 hour  Intake 1709.92 ml  Output --  Net 1709.92 ml   Filed Weights   01/25/20 1526 01/27/20 1034  Weight: 81.6 kg 85.3 kg    Data Reviewed: I have personally reviewed and interpreted daily labs, tele strips, imagings as discussed above. I  reviewed all nursing notes, pharmacy notes, vitals, pertinent old records I have discussed plan of care as described above with RN and patient/family.  CBC: Recent Labs  Lab 01/25/20 1543 01/25/20 1754 01/25/20 2204 01/26/20 0421 01/27/20 0901 01/27/20 1254 01/27/20 1929 01/27/20 1929 01/28/20 0313 01/28/20 1402 01/29/20 1118 01/29/20 1754 01/30/20 0419  WBC 10.2   < > 8.5   < > 4.9   < > 8.3  --   --  6.5 17.2* 14.1* 15.3*  NEUTROABS 9.0*  --  6.9  --  3.8  --  6.4  --   --   --   --   --  13.9*  HGB 8.7*   < > 8.4*   < > 7.7*   < > 8.0*   < > 7.4* 8.4* 8.4* 7.9* 7.9*  HCT 25.7*   < > 25.0*   < > 23.1*   < > 24.4*   < > 22.8* 25.7* 25.2* 24.7* 24.5*  MCV 101.2*   < > 100.4*   < > 102.2*   < > 104.7*  --   --  106.2* 104.1* 106.0* 106.1*  PLT 123*   < > 114*   < > 85*   < > 104*  --   --  103* 96* 92* 95*   < > = values in this interval not displayed.   Basic Metabolic Panel: Recent Labs  Lab 01/26/20 0421 01/27/20 0901 01/28/20 0313 01/29/20 1118 01/30/20 0419  NA 140 139 140 141 139  K 4.0 3.7 3.8 4.0 4.6  CL 111 108 110 108 108  CO2 24 26 25 25 25   GLUCOSE 156* 138* 101* 123* 156*  BUN 18 9 7* 16 25*  CREATININE 0.67 0.72 0.70 0.95 1.00  CALCIUM 7.7* 7.5* 7.7* 7.5* 7.5*  MG  --  2.5* 2.2  --  2.2    Studies: No results found.  Scheduled Meds: . acetaminophen  1,000 mg Oral Q6H  . apixaban  5 mg Oral BID  . B-complex with vitamin C  1 tablet Oral Daily  . docusate sodium  100 mg Oral BID  . folic acid  1 mg Oral Daily  . ketorolac  7.5 mg Intravenous Q6H  . lidocaine  1 patch Transdermal Q24H  . multivitamin with minerals  1 tablet Oral Daily  . omega-3 acid ethyl esters  1 g Oral Daily  . pantoprazole  40 mg Oral BID AC  . thiamine  100 mg Oral Daily   Or  . thiamine  100 mg Intravenous Daily  . vitamin B-12  1,000 mcg Oral Daily   Continuous Infusions: . lactated ringers 125 mL/hr at 01/30/20 1524   PRN Meds: acetaminophen, bisacodyl,  diphenhydrAMINE, magnesium hydroxide, metoCLOPramide **OR** metoCLOPramide (REGLAN) injection, morphine injection, ondansetron **OR** ondansetron (ZOFRAN) IV, oxyCODONE, sodium phosphate, traMADol  Time spent: 35 minutes  Author: 02/01/20, MD [facility] 01/30/2020 4:59 PM  To reach  On-call, see care teams to locate the attending and reach out via [email or web address]. Between 7PM-7AM, please contact night-coverage If you still have difficulty reaching the attending provider, please page the [facility] (Director on Call) for [facility] on amion for assistance.

## 2020-01-30 NOTE — TOC Progression Note (Addendum)
Transition of Care [facility]) - Progression Note    Patient Details  Name: Joshau Code MRN: [id number] Date of Birth: 07/12/1955  [facility] CM/SW Contact  Liliana Cline, LCSW Phone Number: 01/30/2020, 1:15 PM  Clinical Narrative:    CSW spoke with patient's wife. She reported they are agreeable to SNF rehab at discharge. She reported she would prefer [facility] but is open to other local facilities and understands they have been full recently. Patient does not want [facility]. Patient has had both COVID vaccines. CSW will start SNF work up.  3:00- Asked [facility] to review referral and see if they can make a bed offer. Only other pending are [facility] (no beds), [facility] (not accepting patients this week), and [facility] (not accepting SNF rehab patients right now).    Expected Discharge Plan: Home/Self Care Barriers to Discharge: Continued Medical Work up  Expected Discharge Plan and Services Expected Discharge Plan: Home/Self Care In-house Referral: Clinical Social Work     Living arrangements for the past 2 months: Single Family Home                                       Social Determinants of Health (SDOH) Interventions    Readmission Risk Interventions No flowsheet data found.

## 2020-01-30 NOTE — Evaluation (Addendum)
Physical Therapy Evaluation Patient Details Name: James Sanford MRN: [id number] DOB: October 16, 1955 Today's Date: 01/30/2020   History of Present Illness  James Sanford is a 64yoM (PMH: ETOH abuse, DM, HLD, PAF, HTN, OSA) who comes to [facility] 10/22 after a fall at home c subsequent Rt knee pain. PTA pt experiencing BRBPR. Imaging reveals Rt femur frature. Underwent EGD 10/24, colonoscopy 10/25. Underwent ORIF of Right distal femur fracture (periprosthetic) c Dr. Joice Lofts on 10/26. Pt is NWB RLE postoperatively.  Clinical Impression  Pt admitted with above diagnosis. Pt currently with functional limitations due to the deficits listed below (see "PT Problem List"). Available vitals this date show Hb: 7.9, HCT: 24.5. Upon entry, pt in bed, awake and agreeable to participate. Pt denies any pain during session. The pt is alert and oriented x4, pleasant, conversational, and generally a good historian. Modified independent bed mobility with lightheadedness upon sitting. Supervision for transfers (prior experience c RW evident), minGuardA for AMB in-room distances with exertional limitations due to hop-to gait and ABLA. Pt performs 2 trials of AMB, able to perform NWB correctly the 2nd trial. Pt is not able to perform stairs this date due to weakness and poor tolerance to exertion. The patient has several stairs to enter home, 2nd floor bed room but could sleep on first level. Functional mobility assessment demonstrates increased effort/time requirements, poor tolerance, and need for physical assistance, whereas the patient performed these at a higher level of independence PTA. Pt will benefit from skilled PT intervention to increase independence and safety with basic mobility in preparation for discharge to the venue listed below.       Follow Up Recommendations SNF;Supervision for mobility/OOB    Equipment Recommendations  Rolling walker with 5" wheels    Recommendations for Other Services        Precautions / Restrictions Precautions Precautions: Fall Restrictions Weight Bearing Restrictions: Yes RLE Weight Bearing: Non weight bearing      Mobility  Bed Mobility Overal bed mobility: Modified Independent                  Transfers Overall transfer level: Needs assistance Equipment used: Rolling walker (2 wheeled) Transfers: Sit to/from Stand Sit to Stand: Supervision         General transfer comment: cued for NWB, no other cues needed  Ambulation/Gait   Gait Distance (Feet): 20 Feet Assistive device: Rolling walker (2 wheeled)       General Gait Details: hop-to patterns with RW, Rt knee brace; pt performs TDWB first time, then NWB 2nd time, notable increase in energy requirements. Pt very fatgiued with effort. No pain limitations.  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   Dominant Hand: Right    Extremity/Trunk Assessment   Upper Extremity Assessment Upper Extremity Assessment: Overall WFL for tasks assessed    Lower Extremity Assessment Lower Extremity Assessment: Overall WFL for tasks assessed    Cervical / Trunk Assessment Cervical / Trunk Assessment: Normal  Communication      Cognition Arousal/Alertness: Awake/alert Behavior During Therapy: WFL for tasks assessed/performed Overall Cognitive Status:  Within Functional Limits for tasks assessed                                        General Comments      Exercises     Assessment/Plan    PT Assessment Patient needs continued PT services  PT Problem List Decreased strength;Decreased range of motion;Decreased activity tolerance;Decreased mobility;Decreased balance;Decreased knowledge of precautions;Cardiopulmonary status limiting activity;Decreased knowledge of use of DME       PT Treatment Interventions Gait training;DME instruction;Balance training;Stair training;Functional mobility training;Therapeutic activities;Therapeutic exercise;Patient/family education    PT Goals (Current goals can be found in the Care Plan section)  Acute Rehab PT Goals Patient Stated Goal: return to home, work eventually PT Goal Formulation: With patient Time For Goal Achievement: 02/13/20 Potential to Achieve Goals: Fair    Frequency BID   Barriers to discharge Inaccessible home environment      Co-evaluation               AM-PAC PT "6 Clicks" Mobility  Outcome Measure Help needed turning from your back to your side while in a flat bed without using bedrails?: None Help needed moving from lying on your back to sitting on the side of a flat bed without using bedrails?: None Help needed moving to and from a bed to a chair (including a wheelchair)?: A Little Help needed standing up from a chair using your arms (e.g., wheelchair or bedside chair)?: A Little Help needed to walk in hospital room?: A Lot Help needed climbing 3-5 steps with a railing? : A Lot 6 Click Score: 18    End of Session Equipment Utilized During Treatment: Right knee immobilizer;Gait belt (Locked, hinged knee brace) Activity Tolerance: No increased pain;Patient tolerated treatment well;Patient limited by fatigue Patient left: in chair;with chair alarm set;with call bell/phone within reach Nurse Communication: Weight bearing status;Mobility status PT  Visit Diagnosis: Difficulty in walking, not elsewhere classified (R26.2);Muscle weakness (generalized) (M62.81);Dizziness and giddiness (R42)    Time: 6213-0865 PT Time Calculation (min) (ACUTE ONLY): 29 min   Charges:   PT Evaluation $PT Eval Moderate Complexity: 1 Mod PT Treatments $Gait Training: 8-22 mins        9:44 AM, 01/30/20 Rosamaria Lints, PT, DPT Physical Therapist - [facility]  [phone] (ASCOM)    Jaylyn Booher C 01/30/2020, 9:40 AM

## 2020-01-30 NOTE — [place] FL2 (Signed)
[place] MEDICAID FL2 LEVEL OF CARE SCREENING TOOL     IDENTIFICATION  Patient Name: James Sanford Birthdate: 10/06/1955 Sex: male Admission Date (Current Location): 01/25/2020  [place] and [place] Number:  Chiropodist and Address:  [facility], [address] [place], [place] [address]      Provider Number: [id number]  Attending Physician Name and Address:  Delfino Lovett, MD  Relative Name and Phone Number:  OLIVER, HEITZENRATER (Spouse) [phone] [facility])    Current Level of Care: Hospital Recommended Level of Care: Skilled Nursing Facility Prior Approval Number:    Date Approved/Denied:   PASRR Number: [id number]  Discharge Plan: SNF    Current Diagnoses: Patient Active Problem List   Diagnosis Date Noted  . Conceited it was a great history will call admit 01/28/2020  . Hyperlipemia   . Acute GI bleeding 01/25/2020  . Diabetes mellitus type 2, uncomplicated (HCC) 01/25/2020  . Hyperlipidemia 01/25/2020  . Obesity 01/25/2020  . Hypertension 01/25/2020  . Sleep apnea 01/25/2020  . Alcohol abuse 03/07/2019  . Cardiomyopathy, idiopathic (HCC) 03/07/2019  . Paroxysmal A-fib (HCC) 11/07/2017  . Radius fracture 10/29/2017    Orientation RESPIRATION BLADDER Height & Weight     Self, Time, Situation, Place  Normal Continent Weight: 188 lb (85.3 kg) Height:  6' (182.9 cm)  BEHAVIORAL SYMPTOMS/MOOD NEUROLOGICAL BOWEL NUTRITION STATUS      Continent Diet (heart healthy/carb modified, thin liquids)  AMBULATORY STATUS COMMUNICATION OF NEEDS Skin   Limited Assist Verbally Surgical wounds                       Personal Care Assistance Level of Assistance  Bathing, Feeding, Dressing Bathing Assistance: Limited assistance Feeding assistance: Independent Dressing Assistance: Limited assistance     Functional Limitations Info             SPECIAL CARE FACTORS FREQUENCY  PT (By licensed PT), OT (By licensed OT)     PT  Frequency: 5 x/week OT Frequency: 5 x/week            Contractures      Additional Factors Info  Code Status, Allergies Code Status Info: full code Allergies Info: penicillins           Current Medications (01/30/2020):  This is the current hospital active medication list Current Facility-Administered Medications  Medication Dose Route Frequency Provider Last Rate Last Admin  . acetaminophen (TYLENOL) tablet 1,000 mg  1,000 mg Oral Q6H Poggi, Excell Seltzer, MD   1,000 mg at 01/30/20 1114  . acetaminophen (TYLENOL) tablet 325-650 mg  325-650 mg Oral Q6H PRN Poggi, Excell Seltzer, MD      . apixaban (ELIQUIS) tablet 5 mg  5 mg Oral BID Rolly Salter, MD      . B-complex with vitamin C tablet 1 tablet  1 tablet Oral Daily Poggi, Excell Seltzer, MD   1 tablet at 01/30/20 1114  . bisacodyl (DULCOLAX) suppository 10 mg  10 mg Rectal Daily PRN Poggi, Excell Seltzer, MD      . ceFAZolin (ANCEF) IVPB 2g/100 mL premix  2 g Intravenous Q6H Poggi, Excell Seltzer, MD 200 mL/hr at 01/30/20 1258 2 g at 01/30/20 1258  . diphenhydrAMINE (BENADRYL) 12.5 MG/5ML elixir 12.5-25 mg  12.5-25 mg Oral Q4H PRN Poggi, Excell Seltzer, MD      . docusate sodium (COLACE) capsule 100 mg  100 mg Oral BID Christena Flake, MD   100 mg at 01/30/20 0946  .  folic acid (FOLVITE) tablet 1 mg  1 mg Oral Daily Poggi, Excell Seltzer, MD   1 mg at 01/30/20 0945  . ketorolac (TORADOL) 15 MG/ML injection 7.5 mg  7.5 mg Intravenous Q6H Poggi, Excell Seltzer, MD   7.5 mg at 01/30/20 1117  . lactated ringers infusion   Intravenous Continuous Poggi, Excell Seltzer, MD 125 mL/hr at 01/30/20 0347 Restarted at 01/30/20 0347  . lidocaine (LIDODERM) 5 % 1 patch  1 patch Transdermal Q24H Rolly Salter, MD   1 patch at 01/29/20 2029  . magnesium hydroxide (MILK OF MAGNESIA) suspension 30 mL  30 mL Oral Daily PRN Poggi, Excell Seltzer, MD      . metoCLOPramide (REGLAN) tablet 5-10 mg  5-10 mg Oral Q8H PRN Poggi, Excell Seltzer, MD       Or  . metoCLOPramide (REGLAN) injection 5-10 mg  5-10 mg Intravenous Q8H PRN Poggi,  Excell Seltzer, MD      . morphine 2 MG/ML injection 2-4 mg  2-4 mg Intravenous Q2H PRN Poggi, Excell Seltzer, MD      . multivitamin with minerals tablet 1 tablet  1 tablet Oral Daily Poggi, Excell Seltzer, MD   1 tablet at 01/30/20 0945  . omega-3 acid ethyl esters (LOVAZA) capsule 1 g  1 g Oral Daily Tressie Ellis, RPH   1 g at 01/30/20 0945  . ondansetron (ZOFRAN) tablet 4 mg  4 mg Oral Q6H PRN Poggi, Excell Seltzer, MD       Or  . ondansetron (ZOFRAN) injection 4 mg  4 mg Intravenous Q6H PRN Poggi, Excell Seltzer, MD      . oxyCODONE (Oxy IR/ROXICODONE) immediate release tablet 5-10 mg  5-10 mg Oral Q4H PRN Poggi, Excell Seltzer, MD      . pantoprazole (PROTONIX) EC tablet 40 mg  40 mg Oral BID AC Rolly Salter, MD   40 mg at 01/30/20 0946  . sodium phosphate (FLEET) 7-19 GM/118ML enema 1 enema  1 enema Rectal Once PRN Poggi, Excell Seltzer, MD      . thiamine tablet 100 mg  100 mg Oral Daily Poggi, Excell Seltzer, MD   100 mg at 01/30/20 0945   Or  . thiamine (B-1) injection 100 mg  100 mg Intravenous Daily Poggi, Excell Seltzer, MD      . traMADol Janean Sark) tablet 50 mg  50 mg Oral Q6H PRN Poggi, Excell Seltzer, MD      . vitamin B-12 (CYANOCOBALAMIN) tablet 1,000 mcg  1,000 mcg Oral Daily Poggi, Excell Seltzer, MD   1,000 mcg at 01/30/20 0945     Discharge Medications: Please see discharge summary for a list of discharge medications.  Relevant Imaging Results:  Relevant Lab Results:   Additional Information SS # [id number]  Saryiah Bencosme E Mel Langan, LCSW

## 2020-01-30 NOTE — Progress Notes (Signed)
  Subjective: 1 Day Post-Op Procedure(s) (LRB): OPEN REDUCTION INTERNAL FIXATION (ORIF) DISTAL FEMUR FRACTURE (Right) Patient reports pain as mild.   Patient is well, and has had no acute complaints or problems PT and care management to assist with d/c planning. Negative for chest pain and shortness of breath Fever: no Gastrointestinal:Negative for nausea and vomiting  Objective: Vital signs in last 24 hours: Temp:  [97.8 F (36.6 C)-99.3 F (37.4 C)] 97.8 F (36.6 C) (10/27 0728) Pulse Rate:  [66-88] 66 (10/27 0728) Resp:  [16-20] 16 (10/27 0728) BP: (81-112)/(56-83) 112/83 (10/27 0728) SpO2:  [93 %-100 %] 98 % (10/27 0728)  Intake/Output from previous day:  Intake/Output Summary (Last 24 hours) at 01/30/2020 0754 Last data filed at 01/30/2020 0433 Gross per 24 hour  Intake 2469.92 ml  Output 325 ml  Net 2144.92 ml    Intake/Output this shift: No intake/output data recorded.  Labs: Recent Labs    01/28/20 0313 01/28/20 1402 01/29/20 1118 01/29/20 1754 01/30/20 0419  HGB 7.4* 8.4* 8.4* 7.9* 7.9*   Recent Labs    01/29/20 1754 01/30/20 0419  WBC 14.1* 15.3*  RBC 2.33* 2.31*  HCT 24.7* 24.5*  PLT 92* 95*   Recent Labs    01/29/20 1118 01/30/20 0419  NA 141 139  K 4.0 4.6  CL 108 108  CO2 25 25  BUN 16 25*  CREATININE 0.95 1.00  GLUCOSE 123* 156*  CALCIUM 7.5* 7.5*   Recent Labs    01/27/20 0901 01/28/20 0313  INR 1.2 1.1     EXAM General - Patient is Alert, Appropriate and Oriented Extremity - ABD soft Sensation intact distally Intact pulses distally Dorsiflexion/Plantar flexion intact Incision: scant drainage  Right leg compartments soft to palpation. Negative homans bilaterally. Dressing/Incision - blood tinged drainage Motor Function - intact, moving foot and toes well on exam.   Past Medical History:  Diagnosis Date  . Arthritis   . Diabetes (HCC)   . Hyperlipemia   . Hypertension   . Sleep apnea     Assessment/Plan: 1  Day Post-Op Procedure(s) (LRB): OPEN REDUCTION INTERNAL FIXATION (ORIF) DISTAL FEMUR FRACTURE (Right) Principal Problem:   Alcohol abuse Active Problems:   Acute GI bleeding   Diabetes mellitus type 2, uncomplicated (HCC)   Cardiomyopathy, idiopathic (HCC)   Hyperlipidemia   Paroxysmal A-fib (HCC)   Obesity   Hypertension   Conceited it was a great history will call admit  Estimated body mass index is 25.5 kg/m as calculated from the following:   Height as of this encounter: 6' (1.829 m).   Weight as of this encounter: 85.3 kg. Advance diet Up with therapy D/C IV fluids when tolerating po intake.  Labs reviewed, Hg 7.9, continue to monitor. WBC 15.3, no fevers overnight. Up with therapy today, begin working on BM. Keep brace locked in extension when ambulating. Can unlock brace when not moving around and work on some gentle flexion of the knee.  DVT Prophylaxis - TED hose and Eliquis Non-weightbearing to right leg  J. Horris Latino, PA-C [facility] 01/30/2020, 7:54 AM

## 2020-01-30 NOTE — Anesthesia Postprocedure Evaluation (Signed)
Anesthesia Post Note  Patient: Jarris Kortz Reh  Procedure(s) Performed: ESOPHAGOGASTRODUODENOSCOPY (EGD) WITH PROPOFOL (N/A ) COLONOSCOPY WITH PROPOFOL (N/A )  Patient location during evaluation: Endoscopy Anesthesia Type: General Level of consciousness: awake and alert and oriented Pain management: pain level controlled Vital Signs Assessment: post-procedure vital signs reviewed and stable Respiratory status: spontaneous breathing Cardiovascular status: blood pressure returned to baseline Anesthetic complications: no   No complications documented.   Last Vitals:  Vitals:   01/30/20 0728 01/30/20 1059  BP: 112/83 103/73  Pulse: 66 81  Resp: 16 19  Temp: 36.6 C 36.6 C  SpO2: 98% 99%    Last Pain:  Vitals:   01/30/20 1059  TempSrc: Oral  PainSc:                  Annalysa Mohammad

## 2020-01-30 NOTE — Progress Notes (Signed)
Physical Therapy Treatment Patient Details Name: James Sanford MRN: [id number] DOB: 04/02/1956 Today's Date: 01/30/2020    History of Present Illness James Sanford is a 64yoM (PMH: ETOH abuse, DM, HLD, PAF, HTN, OSA) who comes to [facility] 10/22 after a fall at home Sanford subsequent Rt knee pain. PTA pt experiencing BRBPR. Imaging reveals Rt femur frature. Underwent EGD 10/24, colonoscopy 10/25. Underwent ORIF of Right distal femur fracture (periprosthetic) Sanford Dr. Joice Lofts on 10/26. Pt is NWB RLE postoperatively.    PT Comments    Pt still up in recliner, wife in room. Pt takes part in transfers and gait training with RW, RLE NWB. Pt educated on being allowed for short term, intermittent unlocking of knee brace per ortho PA, but pt should aim to keep brace locked most of day. Pt takes part in gait and transfers training with RW, tolerance still quite limited, suspect due to ABLA and high ergonomic requirements of hop-to gait with RW. Pt assisted to [facility] for BM, able to clean self with stand-by safety. No assist needed for pivot to/from bed or return to supine. Asked pt to obtain a shoe for additional gait training to facilitate RLE NWB in gait. Will attempt stairs training tomorrow if pt is able.    Follow Up Recommendations  SNF;Supervision for mobility/OOB     Equipment Recommendations  Rolling walker with 5" wheels    Recommendations for Other Services       Precautions / Restrictions Precautions Precautions: Fall Required Braces or Orthoses: Knee Immobilizer - Right Knee Immobilizer - Right: On at all times Restrictions RLE Weight Bearing: Non weight bearing    Mobility  Bed Mobility Overal bed mobility: Modified Independent                Transfers Overall transfer level: Needs assistance Equipment used: Rolling walker (2 wheeled) Transfers: Sit to/from [employer] Sit to Stand: Supervision Stand pivot transfers: Supervision       General transfer  comment: cues for safe SPT to/from [facility]  Ambulation/Gait   Gait Distance (Feet): 30 Feet Assistive device: Rolling walker (2 wheeled)       General Gait Details: hop-to patterns with RW, Rt knee brace; pt performs NWB both times without cues. Pt very fatgiued with effort. No pain limitations.   Stairs             Wheelchair Mobility    Modified Rankin (Stroke Patients Only)       Balance Overall balance assessment: Modified Independent;History of Falls                                          Cognition Arousal/Alertness: Awake/alert Behavior During Therapy: WFL for tasks assessed/performed Overall Cognitive Status: Within Functional Limits for tasks assessed                                        Exercises Other Exercises Other Exercises: STS from EOB, hands from bed 3x Other Exercises: 8ft AMB x2, rest between.    General Comments        Pertinent Vitals/Pain Pain Assessment:  (Right knee achiness)    Home Living                      Prior Function  PT Goals (current goals can now be found in the care plan section) Acute Rehab PT Goals Patient Stated Goal: return to home, work eventually PT Goal Formulation: With patient Time For Goal Achievement: 02/13/20 Potential to Achieve Goals: Fair Progress towards PT goals: Progressing toward goals    Frequency    BID      PT Plan Current plan remains appropriate    Co-evaluation              AM-PAC PT "6 Clicks" Mobility   Outcome Measure  Help needed turning from your back to your side while in a flat bed without using bedrails?: None Help needed moving from lying on your back to sitting on the side of a flat bed without using bedrails?: None Help needed moving to and from a bed to a chair (including a wheelchair)?: A Little Help needed standing up from a chair using your arms (e.g., wheelchair or bedside chair)?: A Little Help  needed to walk in hospital room?: A Little Help needed climbing 3-5 steps with a railing? : A Lot 6 Click Score: 19    End of Session Equipment Utilized During Treatment: Right knee immobilizer;Gait belt Activity Tolerance: No increased pain;Patient tolerated treatment well;Patient limited by fatigue Patient left: in chair;with chair alarm set;with call bell/phone within reach Nurse Communication: Weight bearing status;Mobility status PT Visit Diagnosis: Difficulty in walking, not elsewhere classified (R26.2);Muscle weakness (generalized) (M62.81);Dizziness and giddiness (R42)     Time: 8003-4917 PT Time Calculation (min) (ACUTE ONLY): 35 min  Charges:  $Gait Training: 8-22 mins $Therapeutic Exercise: 8-22 mins                     4:31 PM, 01/30/20 Rosamaria Lints, PT, DPT Physical Therapist - [facility]  [phone] (ASCOM)    James Sanford 01/30/2020, 4:28 PM

## 2020-01-31 ENCOUNTER — Encounter: Payer: Self-pay | Admitting: Surgery

## 2020-01-31 DIAGNOSIS — I428 Other cardiomyopathies: Secondary | ICD-10-CM | POA: Diagnosis not present

## 2020-01-31 DIAGNOSIS — F32A Depression, unspecified: Secondary | ICD-10-CM

## 2020-01-31 DIAGNOSIS — F1010 Alcohol abuse, uncomplicated: Secondary | ICD-10-CM | POA: Diagnosis not present

## 2020-01-31 DIAGNOSIS — E785 Hyperlipidemia, unspecified: Secondary | ICD-10-CM | POA: Diagnosis not present

## 2020-01-31 DIAGNOSIS — K922 Gastrointestinal hemorrhage, unspecified: Secondary | ICD-10-CM | POA: Diagnosis not present

## 2020-01-31 LAB — CBC
HCT: 24.1 % — ABNORMAL LOW (ref 39.0–52.0)
Hemoglobin: 7.9 g/dL — ABNORMAL LOW (ref 13.0–17.0)
MCH: 34.5 pg — ABNORMAL HIGH (ref 26.0–34.0)
MCHC: 32.8 g/dL (ref 30.0–36.0)
MCV: 105.2 fL — ABNORMAL HIGH (ref 80.0–100.0)
Platelets: 101 10*3/uL — ABNORMAL LOW (ref 150–400)
RBC: 2.29 MIL/uL — ABNORMAL LOW (ref 4.22–5.81)
RDW: 14.1 % (ref 11.5–15.5)
WBC: 7 10*3/uL (ref 4.0–10.5)
nRBC: 0 % (ref 0.0–0.2)

## 2020-01-31 LAB — BASIC METABOLIC PANEL
Anion gap: 4 — ABNORMAL LOW (ref 5–15)
BUN: 27 mg/dL — ABNORMAL HIGH (ref 8–23)
CO2: 28 mmol/L (ref 22–32)
Calcium: 7.2 mg/dL — ABNORMAL LOW (ref 8.9–10.3)
Chloride: 108 mmol/L (ref 98–111)
Creatinine, Ser: 0.71 mg/dL (ref 0.61–1.24)
GFR, Estimated: >60 mL/min (ref >60)
Glucose, Bld: 86 mg/dL (ref 70–99)
Potassium: 4.3 mmol/L (ref 3.5–5.1)
Sodium: 140 mmol/L (ref 135–145)

## 2020-01-31 LAB — GLUCOSE, CAPILLARY: Glucose-Capillary: 106 mg/dL — ABNORMAL HIGH (ref 70–99)

## 2020-01-31 MED ORDER — OXYCODONE HCL 5 MG PO TABS: 5.0000 mg | ORAL_TABLET | ORAL | 0 refills | Status: DC | PRN

## 2020-01-31 NOTE — TOC Progression Note (Addendum)
Transition of Care [facility]) - Progression Note    Patient Details  Name: James Sanford MRN: [id number] Date of Birth: [birth year]  [facility] CM/SW Contact  Liliana Cline, LCSW Phone Number: 01/31/2020, 9:05 AM  Clinical Narrative:   Reached out to [facility] again to see if they can accept patient. Spoke to [employer] who reported she will review patient now and get back to me as soon as possible about if they can offer a bed.  9:15- Spoke to Tammy from [facility] who said she can accept patient. CSW called patient's wife who reported she would prefer a different facility, she understands this is the only SNF option locally. She reported she heard [facility] may make an exception since patient is Anselmo Rod, informed her that I have not heard this but encouraged her to reach out to [facility] to check. She agreed to call [facility] then let me know. She understands the SNF she chooses must start authorization which can take a while so they need to choose a facility as soon as possible.  12:37- Reached out to patient's wife to see if she heard back from [facility]. She reported she has heard back from [facility] and is calling them again. She was agreeable to CSW extending bed search since she does not want [facility] D[facility]. Updated Tammy with [facility] not to start insurance auth since wife isn't agreeable to [facility] at this time. Bed search extended.  1:30- Call from wife who spoke to a PT at [facility] who was going to talk to Admissions. CSW called Admissions Worker Sue Lush. She reported they do not have any beds and are out of network for patient's insurance. CSW updated patient's wife. Reviewed bed offers at [facility] and [facility]. Deferred to Medicare.gov per guidelines. She decided to accept the bed at [facility] based on this website. CSW updated Tammy with [facility] who reported they will start insurance authorization and let CSW know when Berkley Harvey is  obtained. Updated MD.      Expected Discharge Plan: Home/Self Care Barriers to Discharge: Continued Medical Work up  Expected Discharge Plan and Services Expected Discharge Plan: Home/Self Care In-house Referral: Clinical Social Work     Living arrangements for the past 2 months: Single Family Home                                       Social Determinants of Health (SDOH) Interventions    Readmission Risk Interventions No flowsheet data found.

## 2020-01-31 NOTE — Consult Note (Signed)
[facility] Face-to-Face Psychiatry Consult   Reason for Consult: Follow-up consult for this 64 year old man with alcohol abuse and depression as part of his medical issues Referring Physician: Sherryll Burger Patient Identification: James Sanford MRN:  [id number] Principal Diagnosis: Alcohol abuse Diagnosis:  Principal Problem:   Alcohol abuse Active Problems:   Acute GI bleeding   Diabetes mellitus type 2, uncomplicated (HCC)   Cardiomyopathy, idiopathic (HCC)   Hyperlipidemia   Paroxysmal A-fib (HCC)   Obesity   Hypertension   Conceited it was a great history will call admit   Total Time spent with patient: 20 minutes  Subjective:   James Sanford is a 64 y.o. male patient admitted with "I am feeling good".  HPI: See previous note.  Patient seen.  Patient is reporting significant improvement in his mood as well as his general physical state.  He is eating better using the bathroom well no longer bleeding.  Able to walk better.  Looking forward to going to rehab.  Denies any suicidal thought.  Continues to insist that stopping drinking will be easy but is very superficial about it  Past Psychiatric History: Past history of alcohol abuse  Risk to Self:   Risk to Others:   Prior Inpatient Therapy:   Prior Outpatient Therapy:    Past Medical History:  Past Medical History:  Diagnosis Date  . Arthritis   . Diabetes (HCC)   . Hyperlipemia   . Hypertension   . Sleep apnea     Past Surgical History:  Procedure Laterality Date  . COLONOSCOPY WITH PROPOFOL N/A 01/28/2020   Procedure: COLONOSCOPY WITH PROPOFOL;  Surgeon: Toney Reil, MD;  Location: [facility] ENDOSCOPY;  Service: Gastroenterology;  Laterality: N/A;  . COLONOSCOPY WITH PROPOFOL N/A 01/28/2020   Procedure: COLONOSCOPY WITH PROPOFOL;  Surgeon: Toney Reil, MD;  Location: [facility] ENDOSCOPY;  Service: Gastroenterology;  Laterality: N/A;  . ESOPHAGOGASTRODUODENOSCOPY (EGD) WITH PROPOFOL N/A 01/28/2020    Procedure: ESOPHAGOGASTRODUODENOSCOPY (EGD) WITH PROPOFOL;  Surgeon: Toney Reil, MD;  Location: [facility] ENDOSCOPY;  Service: Gastroenterology;  Laterality: N/A;  . GASTRIC BYPASS    . HERNIA REPAIR    . JOINT REPLACEMENT Bilateral    Knee surgery  . OPEN REDUCTION INTERNAL FIXATION (ORIF) DISTAL RADIAL FRACTURE Right 10/30/2017   Procedure: OPEN REDUCTION INTERNAL FIXATION (ORIF) DISTAL RADIAL FRACTURE;  Surgeon: Garnette Gunner, MD;  Location: [facility] ORS;  Service: Orthopedics;  Laterality: Right;  . ORIF FEMUR FRACTURE Right 01/29/2020   Procedure: OPEN REDUCTION INTERNAL FIXATION (ORIF) DISTAL FEMUR FRACTURE;  Surgeon: Christena Flake, MD;  Location: [facility] ORS;  Service: Orthopedics;  Laterality: Right;   Family History:  Family History  Problem Relation Age of Onset  . Stroke Mother   . Suicidality Father    Family Psychiatric  History: See previous Social History:  Social History   Substance and Sexual Activity  Alcohol Use Yes   Comment: beer occasionally     Social History   Substance and Sexual Activity  Drug Use Not Currently    Social History   Socioeconomic History  . Marital status: Married    Spouse name: Not on file  . Number of children: Not on file  . Years of education: Not on file  . Highest education level: Not on file  Occupational History  . Not on file  Tobacco Use  . Smoking status: Never Smoker  . Smokeless tobacco: Former Neurosurgeon    Types: Chew  . Tobacco comment: Luellen Pucker about 4 years ago  Vaping Use  . Vaping Use: Never used  Substance and Sexual Activity  . Alcohol use: Yes    Comment: beer occasionally  . Drug use: Not Currently  . Sexual activity: Not on file  Other Topics Concern  . Not on file  Social History Narrative   Lives at home with his wife. Independent baseline   Social Determinants of Health   Financial Resource Strain:   . Difficulty of Paying Living Expenses: Not on file  Food Insecurity:   . Worried About Patent examiner in the Last Year: Not on file  . Ran Out of Food in the Last Year: Not on file  Transportation Needs:   . Lack of Transportation (Medical): Not on file  . Lack of Transportation (Non-Medical): Not on file  Physical Activity:   . Days of Exercise per Week: Not on file  . Minutes of Exercise per Session: Not on file  Stress:   . Feeling of Stress : Not on file  Social Connections:   . Frequency of Communication with Friends and Family: Not on file  . Frequency of Social Gatherings with Friends and Family: Not on file  . Attends Religious Services: Not on file  . Active Member of Clubs or Organizations: Not on file  . Attends Banker Meetings: Not on file  . Marital Status: Not on file   Additional Social History:    Allergies:   Allergies  Allergen Reactions  . Penicillins     Unknown childhood reaction    Labs:  Results for orders placed or performed during the hospital encounter of 01/25/20 (from the past 48 hour(s))  CBC     Status: Abnormal   Collection Time: 01/29/20  5:54 PM  Result Value Ref Range   WBC 14.1 (H) 4.0 - 10.5 K/uL   RBC 2.33 (L) 4.22 - 5.81 MIL/uL   Hemoglobin 7.9 (L) 13.0 - 17.0 g/dL   HCT 95.0 (L) 39 - 52 %   MCV 106.0 (H) 80.0 - 100.0 fL   MCH 33.9 26.0 - 34.0 pg   MCHC 32.0 30.0 - 36.0 g/dL   RDW 93.2 67.1 - 24.5 %   Platelets 92 (L) 150 - 400 K/uL    Comment: REPEATED TO VERIFY Immature Platelet Fraction may be clinically indicated, consider ordering this additional test [id number] CONSISTENT WITH PREVIOUS RESULT    nRBC 0.0 0.0 - 0.2 %    Comment: Performed at [facility], [address] [place], [place] [address]  Lactic acid, plasma     Status: None   Collection Time: 01/29/20  5:54 PM  Result Value Ref Range   Lactic Acid, Venous 1.6 0.5 - 1.9 mmol/L    Comment: Performed at [facility], [address] [place], [place] [address]  Lactic acid, plasma     Status: None   Collection Time:  01/29/20  8:55 PM  Result Value Ref Range   Lactic Acid, Venous 1.4 0.5 - 1.9 mmol/L    Comment: Performed at [facility], [address] [place], [place] [address]  Glucose, capillary     Status: Abnormal   Collection Time: 01/29/20  9:10 PM  Result Value Ref Range   Glucose-Capillary 168 (H) 70 - 99 mg/dL    Comment: Glucose reference range applies only to samples taken after fasting for at least 8 hours.   Comment 1 Notify RN   CBC with Differential/Platelet     Status: Abnormal  Collection Time: 01/30/20  4:19 AM  Result Value Ref Range   WBC 15.3 (H) 4.0 - 10.5 K/uL   RBC 2.31 (L) 4.22 - 5.81 MIL/uL   Hemoglobin 7.9 (L) 13.0 - 17.0 g/dL   HCT 71.2 (L) 39 - 52 %   MCV 106.1 (H) 80.0 - 100.0 fL   MCH 34.2 (H) 26.0 - 34.0 pg   MCHC 32.2 30.0 - 36.0 g/dL   RDW 45.8 09.9 - 83.3 %   Platelets 95 (L) 150 - 400 K/uL    Comment: Immature Platelet Fraction may be clinically indicated, consider ordering this additional test [id number] CONSISTENT WITH PREVIOUS RESULT    nRBC 0.0 0.0 - 0.2 %   Neutrophils Relative % 91 %   Neutro Abs 13.9 (H) 1.7 - 7.7 K/uL   Lymphocytes Relative 3 %   Lymphs Abs 0.5 (L) 0.7 - 4.0 K/uL   Monocytes Relative 5 %   Monocytes Absolute 0.8 0.1 - 1.0 K/uL   Eosinophils Relative 0 %   Eosinophils Absolute 0.0 0.0 - 0.5 K/uL   Basophils Relative 0 %   Basophils Absolute 0.0 0.0 - 0.1 K/uL   WBC Morphology MORPHOLOGY UNREMARKABLE    RBC Morphology MORPHOLOGY UNREMARKABLE    Smear Review Normal platelet morphology    Immature Granulocytes 1 %   Abs Immature Granulocytes 0.14 (H) 0.00 - 0.07 K/uL    Comment: Performed at [facility], [address] [place], [place] [address]  Comprehensive metabolic panel     Status: Abnormal   Collection Time: 01/30/20  4:19 AM  Result Value Ref Range   Sodium 139 135 - 145 mmol/L   Potassium 4.6 3.5 - 5.1 mmol/L   Chloride 108 98 - 111 mmol/L   CO2 25 22 - 32 mmol/L   Glucose, Bld 156 (H) 70  - 99 mg/dL    Comment: Glucose reference range applies only to samples taken after fasting for at least 8 hours.   BUN 25 (H) 8 - 23 mg/dL   Creatinine, Ser 9.37 0.61 - 1.24 mg/dL   Calcium 7.5 (L) 8.9 - 10.3 mg/dL   Total Protein 4.9 (L) 6.5 - 8.1 g/dL   Albumin 2.5 (L) 3.5 - 5.0 g/dL   AST 24 15 - 41 U/L   ALT 16 0 - 44 U/L   Alkaline Phosphatase 72 38 - 126 U/L   Total Bilirubin 0.8 0.3 - 1.2 mg/dL   GFR, Estimated >90 >24 mL/min    Comment: (NOTE) Calculated using the CKD-EPI Creatinine Equation (2021)    Anion gap 6 5 - 15    Comment: Performed at [facility], [address] [place], [place] [address]  Magnesium     Status: None   Collection Time: 01/30/20  4:19 AM  Result Value Ref Range   Magnesium 2.2 1.7 - 2.4 mg/dL    Comment: Performed at [facility], [address] [place], [place] [address]  Glucose, capillary     Status: Abnormal   Collection Time: 01/30/20  7:25 AM  Result Value Ref Range   Glucose-Capillary 128 (H) 70 - 99 mg/dL    Comment: Glucose reference range applies only to samples taken after fasting for at least 8 hours.   Comment 1 Notify RN   Glucose, capillary     Status: Abnormal   Collection Time: 01/30/20 11:33 AM  Result Value Ref Range   Glucose-Capillary 130 (H) 70 - 99 mg/dL    Comment: Glucose reference  range applies only to samples taken after fasting for at least 8 hours.   Comment 1 Notify RN   Glucose, capillary     Status: Abnormal   Collection Time: 01/30/20  4:39 PM  Result Value Ref Range   Glucose-Capillary 118 (H) 70 - 99 mg/dL    Comment: Glucose reference range applies only to samples taken after fasting for at least 8 hours.   Comment 1 Notify RN   Glucose, capillary     Status: Abnormal   Collection Time: 01/30/20  9:15 PM  Result Value Ref Range   Glucose-Capillary 115 (H) 70 - 99 mg/dL    Comment: Glucose reference range applies only to samples taken after fasting for at least 8 hours.   Comment 1  Notify RN   Basic metabolic panel     Status: Abnormal   Collection Time: 01/31/20  6:35 AM  Result Value Ref Range   Sodium 140 135 - 145 mmol/L   Potassium 4.3 3.5 - 5.1 mmol/L   Chloride 108 98 - 111 mmol/L   CO2 28 22 - 32 mmol/L   Glucose, Bld 86 70 - 99 mg/dL    Comment: Glucose reference range applies only to samples taken after fasting for at least 8 hours.   BUN 27 (H) 8 - 23 mg/dL   Creatinine, Ser 4.25 0.61 - 1.24 mg/dL   Calcium 7.2 (L) 8.9 - 10.3 mg/dL   GFR, Estimated >95 >63 mL/min    Comment: (NOTE) Calculated using the CKD-EPI Creatinine Equation (2021)    Anion gap 4 (L) 5 - 15    Comment: Performed at [facility], [address] [place], [place] [address]  CBC     Status: Abnormal   Collection Time: 01/31/20  6:35 AM  Result Value Ref Range   WBC 7.0 4.0 - 10.5 K/uL   RBC 2.29 (L) 4.22 - 5.81 MIL/uL   Hemoglobin 7.9 (L) 13.0 - 17.0 g/dL   HCT 33.2 (L) 39 - 52 %   MCV 105.2 (H) 80.0 - 100.0 fL   MCH 34.5 (H) 26.0 - 34.0 pg   MCHC 32.8 30.0 - 36.0 g/dL   RDW 95.1 88.4 - 16.6 %   Platelets 101 (L) 150 - 400 K/uL    Comment: Immature Platelet Fraction may be clinically indicated, consider ordering this additional test [id number]    nRBC 0.0 0.0 - 0.2 %    Comment: Performed at [facility], [address] [place], [place] [address]  Glucose, capillary     Status: Abnormal   Collection Time: 01/31/20  4:26 PM  Result Value Ref Range   Glucose-Capillary 106 (H) 70 - 99 mg/dL    Comment: Glucose reference range applies only to samples taken after fasting for at least 8 hours.   Comment 1 Notify RN     Current Facility-Administered Medications  Medication Dose Route Frequency Provider Last Rate Last Admin  . acetaminophen (TYLENOL) tablet 325-650 mg  325-650 mg Oral Q6H PRN Poggi, Excell Seltzer, MD      . apixaban (ELIQUIS) tablet 5 mg  5 mg Oral BID Rolly Salter, MD   5 mg at 01/31/20 0943  . B-complex with vitamin C tablet 1 tablet  1  tablet Oral Daily Poggi, Excell Seltzer, MD   1 tablet at 01/30/20 1114  . bisacodyl (DULCOLAX) suppository 10 mg  10 mg Rectal Daily PRN Poggi, Excell Seltzer, MD      . diphenhydrAMINE (BENADRYL) 12.5  MG/5ML elixir 12.5-25 mg  12.5-25 mg Oral Q4H PRN Poggi, Excell Seltzer, MD      . docusate sodium (COLACE) capsule 100 mg  100 mg Oral BID Christena Flake, MD   100 mg at 01/30/20 0946  . folic acid (FOLVITE) tablet 1 mg  1 mg Oral Daily Poggi, Excell Seltzer, MD   1 mg at 01/30/20 0945  . lactated ringers infusion   Intravenous Continuous Poggi, Excell Seltzer, MD 125 mL/hr at 01/31/20 1611 New Bag at 01/31/20 1611  . lidocaine (LIDODERM) 5 % 1 patch  1 patch Transdermal Q24H Rolly Salter, MD   1 patch at 01/30/20 2038  . magnesium hydroxide (MILK OF MAGNESIA) suspension 30 mL  30 mL Oral Daily PRN Poggi, Excell Seltzer, MD      . metoCLOPramide (REGLAN) tablet 5-10 mg  5-10 mg Oral Q8H PRN Poggi, Excell Seltzer, MD       Or  . metoCLOPramide (REGLAN) injection 5-10 mg  5-10 mg Intravenous Q8H PRN Poggi, Excell Seltzer, MD      . morphine 2 MG/ML injection 2-4 mg  2-4 mg Intravenous Q2H PRN Poggi, Excell Seltzer, MD      . multivitamin with minerals tablet 1 tablet  1 tablet Oral Daily Poggi, Excell Seltzer, MD   1 tablet at 01/30/20 0945  . omega-3 acid ethyl esters (LOVAZA) capsule 1 g  1 g Oral Daily Tressie Ellis, RPH   1 g at 01/30/20 0945  . ondansetron (ZOFRAN) tablet 4 mg  4 mg Oral Q6H PRN Poggi, Excell Seltzer, MD       Or  . ondansetron (ZOFRAN) injection 4 mg  4 mg Intravenous Q6H PRN Poggi, Excell Seltzer, MD      . oxyCODONE (Oxy IR/ROXICODONE) immediate release tablet 5-10 mg  5-10 mg Oral Q4H PRN Poggi, Excell Seltzer, MD      . pantoprazole (PROTONIX) EC tablet 40 mg  40 mg Oral BID AC Rolly Salter, MD   40 mg at 01/31/20 1607  . sodium phosphate (FLEET) 7-19 GM/118ML enema 1 enema  1 enema Rectal Once PRN Poggi, Excell Seltzer, MD      . thiamine tablet 100 mg  100 mg Oral Daily Poggi, Excell Seltzer, MD   100 mg at 01/30/20 0945   Or  . thiamine (B-1) injection 100 mg  100 mg  Intravenous Daily Poggi, Excell Seltzer, MD      . traMADol Janean Sark) tablet 50 mg  50 mg Oral Q6H PRN Poggi, Excell Seltzer, MD      . vitamin B-12 (CYANOCOBALAMIN) tablet 1,000 mcg  1,000 mcg Oral Daily Poggi, Excell Seltzer, MD   1,000 mcg at 01/30/20 0945    Musculoskeletal: Strength & Muscle Tone: within normal limits Gait & Station: unsteady Patient leans: N/A  Psychiatric Specialty Exam: Physical Exam Vitals and nursing note reviewed.  Constitutional:      Appearance: He is well-developed.  HENT:     Head: Normocephalic and atraumatic.  Eyes:     Conjunctiva/sclera: Conjunctivae normal.     Pupils: Pupils are equal, round, and reactive to light.  Cardiovascular:     Heart sounds: Normal heart sounds.  Pulmonary:     Effort: Pulmonary effort is normal.  Abdominal:     Palpations: Abdomen is soft.  Musculoskeletal:        General: Normal range of motion.     Cervical back: Normal range of motion.  Skin:    General: Skin is warm  and dry.  Neurological:     Mental Status: He is alert.  Psychiatric:        Mood and Affect: Mood normal.        Thought Content: Thought content normal.        Judgment: Judgment normal.     Review of Systems  Constitutional: Negative.   HENT: Negative.   Eyes: Negative.   Respiratory: Negative.   Cardiovascular: Negative.   Gastrointestinal: Negative.   Musculoskeletal: Negative.   Skin: Negative.   Neurological: Negative.   Psychiatric/Behavioral: Negative.     Blood pressure 112/81, pulse 89, temperature 98 F (36.7 C), temperature source Oral, resp. rate 18, height 6' (1.829 m), weight 85.3 kg, SpO2 98 %.Body mass index is 25.5 kg/m.  General Appearance: Casual  Eye Contact:  Good  Speech:  Clear and Coherent  Volume:  Decreased  Mood:  Euthymic  Affect:  Congruent  Thought Process:  Coherent  Orientation:  Full (Time, Place, and Person)  Thought Content:  Logical  Suicidal Thoughts:  No  Homicidal Thoughts:  No  Memory:  Immediate;    Fair Recent;   Fair Remote;   Fair  Judgement:  Fair  Insight:  Fair  Psychomotor Activity:  Normal  Concentration:  Concentration: Fair  Recall:  [employer] of Knowledge:  Fair  Language:  Fair  Akathisia:  No  Handed:  Right  AIMS (if indicated):     Assets:  Desire for Improvement  ADL's:  Impaired  Cognition:  WNL  Sleep:        Treatment Plan Summary: Daily contact with patient to assess and evaluate symptoms and progress in treatment, Medication management and Plan Seems to be much improved in his mood.  No medication orders written.  Counseling about alcohol abuse repeated.  Disposition: Supportive therapy provided about ongoing stressors. Discussed crisis plan, support from social network, calling 911, coming to the Emergency Department, and calling Suicide Hotline.  Mordecai Rasmussen, MD 01/31/2020 5:01 PM

## 2020-01-31 NOTE — Discharge Instructions (Signed)
Diet: As you were doing prior to hospitalization   Shower:  May shower but keep the wounds dry, use an occlusive plastic wrap, NO SOAKING IN TUB.  If the bandage gets wet, change with a clean dry gauze.  Dressing:  You may change your dressing as needed. Change the dressing with sterile gauze dressing.    Activity:  Increase activity slowly as tolerated, but follow the weight bearing instructions below.  No lifting or driving for 6 weeks.  Weight Bearing:   Non-weightbearing to the right leg.  You may unlock the knee brace and work on gentle knee bending.  To prevent constipation: you may use a stool softener such as -  Colace (over the counter) 100 mg by mouth twice a day  Drink plenty of fluids (prune juice may be helpful) and high fiber foods Miralax (over the counter) for constipation as needed.    Itching:  If you experience itching with your medications, try taking only a single pain pill, or even half a pain pill at a time.  You may take up to 10 pain pills per day, and you can also use benadryl over the counter for itching or also to help with sleep.   Precautions:  If you experience chest pain or shortness of breath - call 911 immediately for transfer to the hospital emergency department!!  If you develop a fever greater that 101 F, purulent drainage from wound, increased redness or drainage from wound, or calf pain-Call [facility]                                              Follow- Up Appointment:  Please call for an appointment to be seen in 2 weeks at [facility]

## 2020-01-31 NOTE — Plan of Care (Signed)
  Problem: Health Behavior/Discharge Planning: Goal: Ability to manage health-related needs will improve Outcome: Progressing   Problem: Clinical Measurements: Goal: Diagnostic test results will improve Outcome: Progressing Goal: Respiratory complications will improve Outcome: Progressing Goal: Cardiovascular complication will be avoided Outcome: Progressing   

## 2020-01-31 NOTE — Progress Notes (Signed)
3Physical Therapy Treatment Patient Details Name: James Sanford MRN: [id number] DOB: 02/15/1956 Today's Date: 01/31/2020    History of Present Illness James Sanford is a 64yoM (PMH: ETOH abuse, DM, HLD, PAF, HTN, OSA) who comes to [facility] 10/22 after a fall at home c subsequent Rt knee pain. PTA pt experiencing BRBPR. Imaging reveals Rt femur frature. Underwent EGD 10/24, colonoscopy 10/25. Underwent ORIF of Right distal femur fracture (periprosthetic) c Dr. Joice Lofts on 10/26. Pt is NWB RLE postoperatively.    PT Comments    Pt still in chair at entry, agreeable to participate. Supervision level transfers from recliner. 52ft AMB with RW, then 8ft retro AMB to practice motor planning required for stairs performance. Standing posterior stairs training with 1-inch board x5, pt successful in posterior hop onto platform. Educated on HEP for scapular/shoulder strengthening. Pt asks to remain up in chair at EOS, agreeable to 1 more set of HEP later in day.       Follow Up Recommendations  SNF;Supervision for mobility/OOB     Equipment Recommendations  Rolling walker with 5" wheels    Recommendations for Other Services       Precautions / Restrictions Precautions Precautions: Fall Required Braces or Orthoses: Knee Immobilizer - Right Knee Immobilizer - Right: On at all times Restrictions RLE Weight Bearing: Non weight bearing    Mobility  Bed Mobility               General bed mobility comments: In chair at beginning and end of session.  Transfers Overall transfer level: Needs assistance Equipment used: Rolling walker (2 wheeled) Transfers: Sit to/from Stand Sit to Stand: Supervision         General transfer comment: supervision from recliner, uses armrests to ease efforts  Ambulation/Gait Ambulation/Gait assistance: Min guard Gait Distance (Feet): 30 Feet Assistive device: Rolling walker (2 wheeled) Gait Pattern/deviations: WFL(Within Functional Limits)      General Gait Details: Lt shoe donned; 2 point hop-to gait with RLE NWB. 13ft forward to hallway, then 11ft backward to work on backward hopping for stairs performance. No LOB, but obvious increase in physical effort to perform.   Stairs             Wheelchair Mobility    Modified Rankin (Stroke Patients Only)       Balance Overall balance assessment: Modified Independent;History of Falls                                          Cognition Arousal/Alertness: Awake/alert Behavior During Therapy: WFL for tasks assessed/performed Overall Cognitive Status: Within Functional Limits for tasks assessed                                        Exercises Other Exercises Other Exercises: Arm rest pushups 1x15 Other Exercises: No Money 1x15x3secH Other Exercises: Seated Scapular restraction 1x15x3secH Other Exercises: 1" step (transfer board) posterior stair training hop up with BUE on RW: performed 5x sup-minA for RW placement)    General Comments        Pertinent Vitals/Pain Pain Assessment: No/denies pain    Home Living                      Prior Function  PT Goals (current goals can now be found in the care plan section) Acute Rehab PT Goals Patient Stated Goal: return to home, work eventually PT Goal Formulation: With patient Time For Goal Achievement: 02/13/20 Potential to Achieve Goals: Fair Progress towards PT goals: Progressing toward goals    Frequency    BID      PT Plan Current plan remains appropriate    Co-evaluation              AM-PAC PT "6 Clicks" Mobility   Outcome Measure  Help needed turning from your back to your side while in a flat bed without using bedrails?: None Help needed moving from lying on your back to sitting on the side of a flat bed without using bedrails?: None Help needed moving to and from a bed to a chair (including a wheelchair)?: A Little Help needed  standing up from a chair using your arms (e.g., wheelchair or bedside chair)?: A Little Help needed to walk in hospital room?: A Little Help needed climbing 3-5 steps with a railing? : A Lot 6 Click Score: 19    End of Session Equipment Utilized During Treatment: Right knee immobilizer;Gait belt Activity Tolerance: No increased pain;Patient tolerated treatment well;Patient limited by fatigue Patient left: in chair;with chair alarm set;with call bell/phone within reach Nurse Communication: Weight bearing status;Mobility status PT Visit Diagnosis: Difficulty in walking, not elsewhere classified (R26.2);Muscle weakness (generalized) (M62.81);Dizziness and giddiness (R42)     Time: 3086-5784 PT Time Calculation (min) (ACUTE ONLY): 30 min  Charges:  $Gait Training: 8-22 mins $Therapeutic Exercise: 8-22 mins                     5:09 PM, 01/31/20 Rosamaria Lints, PT, DPT Physical Therapist - [facility]  [phone] (ASCOM)    Dezyre Hoefer C 01/31/2020, 5:06 PM

## 2020-01-31 NOTE — Progress Notes (Signed)
[facility] Progress Note  Patient: James Sanford    IWP:[id number]  DOA: 01/25/2020     Date of Service: the patient was seen and examined on 01/31/2020  Brief hospital course: Past medical history of HTN, A. fib, OSA, HLD, type II DM, alcohol abuse.  Presents with GI bleed history. SP EGD.    Assessment and Plan: 1.  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 Upper GI endoscopy negative for any acute bleeding. Currently on Protonix p.o. Octreotide was initiated but discontinued now. Appreciate GI consultation. EGD shows no evidence of acute bleeding.  No evidence of varices. Colonoscopy was performed twice.  Prep was poor twice.  No acute bleeding reported.  No acute intervention was performed other than biopsy of the polyp. Continue Eliquis Monitor H&H and transfuse hemoglobin less than 7.  2.  Alcohol abuse No evidence of withdrawal for now. Continue CIWA protocol for now.  3.  Type 2 diabetes mellitus, controlled. Continue sliding scale insulin for now.  4.  Paroxysmal A. fib On Eliquis. Rate currently controlled.  5.  Closed minimally displaced periprosthetic right supracondylar femur fracture X-ray actually shows periprosthetic fracture. Status post ORIF by Ortho on 10/26  6.  Thrombocytopenia, chronic, improving Mild worsening right now likely in the setting of PRBC transfusion. In the setting of alcohol abuse. Monitor for now.  Supportive measures  7.  Emphysema Emphysematous changes Seen on CT scan. Likely patient has COPD.  Outpatient work-up recommended.  8.  Depression. Concern for suicidal ideation. Psychiatry was consulted.  Patient does not have suicidal ideation right now. Does not meet inpatient psychiatric criteria. Discontinue sitter.  9.  Hypotension. Resolved now  10.  Leukocytosis. Likely stress reaction. Monitor.  Patient does not have any acute complaints.  Diet: Regular diet.  DVT Prophylaxis:   Place TED hose Start:  01/29/20 1724 Foot Pump / plexipulse Start: 01/29/20 1724    Advance goals of care discussion: Full code  Family Communication: None today  Disposition:  Status is: Inpatient  Remains inpatient appropriate because:Ongoing diagnostic testing needed not appropriate for outpatient work up   Dispo: The patient is from: Home              Anticipated d/c is to: Home              Anticipated d/c date is: 1 day              Patient currently is not medically stable to d/c.  Waiting on wife to decide which facility as she refused [facility] [place].  Subjective: No new complaints.  Wants to go home/SNF  Physical Exam:  General: Appear in mild distress, no Rash; Oral Mucosa Clear, moist. no Abnormal Neck Mass Or lumps, Conjunctiva normal  Cardiovascular: S1 and S2 Present, no Murmur, Respiratory: good respiratory effort, Bilateral Air entry present and CTA, no Crackles, no wheezes Abdomen: Bowel Sound present, Soft and no tenderness Extremities: no Pedal edema, blood tinged drainage  Neurology: alert and oriented to time, place, and person affect appropriate. no new focal deficit Gait not checked due to patient safety concerns   Vitals:   01/30/20 1059 01/30/20 1535 01/31/20 0015 01/31/20 0721  BP: 103/73 104/75 121/86 128/89  Pulse: 81 78 69 66  Resp: 19 18 18 18   Temp: 97.8 F (36.6 C) 98.2 F (36.8 C) 98.3 F (36.8 C) 97.8 F (36.6 C)  TempSrc: Oral Oral Oral Oral  SpO2: 99% 96% 96% 100%  Weight:      Height:  Intake/Output Summary (Last 24 hours) at 01/31/2020 1254 Last data filed at 01/31/2020 0900 Gross per 24 hour  Intake 240 ml  Output 300 ml  Net -60 ml   Filed Weights   01/25/20 1526 01/27/20 1034  Weight: 81.6 kg 85.3 kg    Data Reviewed: I have personally reviewed and interpreted daily labs, tele strips, imagings as discussed above. I reviewed all nursing notes, pharmacy notes, vitals, pertinent old records I have discussed plan of care as described above  with RN and patient/family.  CBC: Recent Labs  Lab 01/25/20 1543 01/25/20 1754 01/25/20 2204 01/26/20 0421 01/27/20 0901 01/27/20 1254 01/27/20 1929 01/28/20 0313 01/28/20 1402 01/29/20 1118 01/29/20 1754 01/30/20 0419 01/31/20 0635  WBC 10.2   < > 8.5   < > 4.9   < > 8.3  --  6.5 17.2* 14.1* 15.3* 7.0  NEUTROABS 9.0*  --  6.9  --  3.8  --  6.4  --   --   --   --  13.9*  --   HGB 8.7*   < > 8.4*   < > 7.7*   < > 8.0*   < > 8.4* 8.4* 7.9* 7.9* 7.9*  HCT 25.7*   < > 25.0*   < > 23.1*   < > 24.4*   < > 25.7* 25.2* 24.7* 24.5* 24.1*  MCV 101.2*   < > 100.4*   < > 102.2*   < > 104.7*  --  106.2* 104.1* 106.0* 106.1* 105.2*  PLT 123*   < > 114*   < > 85*   < > 104*  --  103* 96* 92* 95* 101*   < > = values in this interval not displayed.   Basic Metabolic Panel: Recent Labs  Lab 01/27/20 0901 01/28/20 0313 01/29/20 1118 01/30/20 0419 01/31/20 0635  NA 139 140 141 139 140  K 3.7 3.8 4.0 4.6 4.3  CL 108 110 108 108 108  CO2 26 25 25 25 28   GLUCOSE 138* 101* 123* 156* 86  BUN 9 7* 16 25* 27*  CREATININE 0.72 0.70 0.95 1.00 0.71  CALCIUM 7.5* 7.7* 7.5* 7.5* 7.2*  MG 2.5* 2.2  --  2.2  --     Studies: No results found.  Scheduled Meds: . apixaban  5 mg Oral BID  . B-complex with vitamin C  1 tablet Oral Daily  . docusate sodium  100 mg Oral BID  . folic acid  1 mg Oral Daily  . lidocaine  1 patch Transdermal Q24H  . multivitamin with minerals  1 tablet Oral Daily  . omega-3 acid ethyl esters  1 g Oral Daily  . pantoprazole  40 mg Oral BID AC  . thiamine  100 mg Oral Daily   Or  . thiamine  100 mg Intravenous Daily  . vitamin B-12  1,000 mcg Oral Daily   Continuous Infusions: . lactated ringers 125 mL/hr at 01/31/20 0944   PRN Meds: acetaminophen, bisacodyl, diphenhydrAMINE, magnesium hydroxide, metoCLOPramide **OR** metoCLOPramide (REGLAN) injection, morphine injection, ondansetron **OR** ondansetron (ZOFRAN) IV, oxyCODONE, sodium phosphate, traMADol  Time  spent: 35 minutes  Author: 02/02/20, MD [facility] 01/31/2020 12:54 PM  To reach On-call, see care teams to locate the attending and reach out via [email or web address]. Between 7PM-7AM, please contact night-coverage If you still have difficulty reaching the attending provider, please page the [facility] (Director on Call) for [facility] on amion for assistance.

## 2020-01-31 NOTE — Progress Notes (Signed)
Subjective: 2 Days Post-Op Procedure(s) (LRB): OPEN REDUCTION INTERNAL FIXATION (ORIF) DISTAL FEMUR FRACTURE (Right) Patient reports pain as mild.   Patient is well, and has had no acute complaints or problems Current plan is for d/c to SNF when medically able. Negative for chest pain and shortness of breath Fever: no Gastrointestinal:Negative for nausea and vomiting  Objective: Vital signs in last 24 hours: Temp:  [97.8 F (36.6 C)-98.3 F (36.8 C)] 97.8 F (36.6 C) (10/28 0721) Pulse Rate:  [66-81] 66 (10/28 0721) Resp:  [18-19] 18 (10/28 0721) BP: (103-128)/(73-89) 128/89 (10/28 0721) SpO2:  [96 %-100 %] 100 % (10/28 0721)  Intake/Output from previous day:  Intake/Output Summary (Last 24 hours) at 01/31/2020 0742 Last data filed at 01/31/2020 0651 Gross per 24 hour  Intake 240 ml  Output 300 ml  Net -60 ml    Intake/Output this shift: No intake/output data recorded.  Labs: Recent Labs    01/28/20 1402 01/29/20 1118 01/29/20 1754 01/30/20 0419 01/31/20 0635  HGB 8.4* 8.4* 7.9* 7.9* 7.9*   Recent Labs    01/30/20 0419 01/31/20 0635  WBC 15.3* 7.0  RBC 2.31* 2.29*  HCT 24.5* 24.1*  PLT 95* 101*   Recent Labs    01/30/20 0419 01/31/20 0635  NA 139 140  K 4.6 4.3  CL 108 108  CO2 25 28  BUN 25* 27*  CREATININE 1.00 0.71  GLUCOSE 156* 86  CALCIUM 7.5* 7.2*   No results for input(s): LABPT, INR in the last 72 hours.  EXAM General - Patient is Alert, Appropriate and Oriented Extremity - ABD soft Sensation intact distally Intact pulses distally Dorsiflexion/Plantar flexion intact Incision: scant drainage  Right leg compartments soft to palpation. Negative homans bilaterally. Dressing/Incision - blood tinged drainage Motor Function - intact, moving foot and toes well on exam.  Able to perform a straight leg raise with assistance.  Past Medical History:  Diagnosis Date  . Arthritis   . Diabetes (HCC)   . Hyperlipemia   . Hypertension   .  Sleep apnea    Assessment/Plan: 2 Days Post-Op Procedure(s) (LRB): OPEN REDUCTION INTERNAL FIXATION (ORIF) DISTAL FEMUR FRACTURE (Right) Principal Problem:   Alcohol abuse Active Problems:   Acute GI bleeding   Diabetes mellitus type 2, uncomplicated (HCC)   Cardiomyopathy, idiopathic (HCC)   Hyperlipidemia   Paroxysmal A-fib (HCC)   Obesity   Hypertension   Conceited it was a great history will call admit  Estimated body mass index is 25.5 kg/m as calculated from the following:   Height as of this encounter: 6' (1.829 m).   Weight as of this encounter: 85.3 kg. Advance diet Up with therapy D/C IV fluids when tolerating po intake.  Labs reviewed, Hg 7.9 this AM.  Stable from yesterday. WBC 7.0, significantly improved. Up with therapy today, begin working on BM. Current plan is for d/c to SNF. Keep brace locked in extension when ambulating. Can unlock brace when not moving around and work on some gentle flexion of the knee.  Will continue on Eliquis following discharge for DVT prevention. Follow-up with [facility] in 10-14 days for staple removal.  DVT Prophylaxis - TED hose and Eliquis Non-weightbearing to right leg  J. Horris Latino, PA-C [facility] 01/31/2020, 7:42 AM

## 2020-01-31 NOTE — Progress Notes (Signed)
Physical Therapy Treatment Patient Details Name: James Sanford MRN: [id number] DOB: 1955/10/24 Today's Date: 01/31/2020    History of Present Illness James Sanford is a 64yoM (PMH: ETOH abuse, DM, HLD, PAF, HTN, OSA) who comes to [facility] 10/22 after a fall at home c subsequent Rt knee pain. PTA pt experiencing BRBPR. Imaging reveals Rt femur frature. Underwent EGD 10/24, colonoscopy 10/25. Underwent ORIF of Right distal femur fracture (periprosthetic) c Dr. Joice Lofts on 10/26. Pt is NWB RLE postoperatively.    PT Comments    Pt in bed upon entry, agreeable to participate, meal finished, pain well controlled. ModI bed mobility, minA STS transfers this date, but when reminded of hand placement, able to perform at supervision level. Pt AMB ~53ft with RW similar to yesterday, but improved confidence due to Left shoe use allowing for easier RLE NWB. Attempted performance of 8" step unsuccessful, similar with 4" step, leaving pt frustrated. Pt up to chair at end of session, educated on triceps pushups from chair for BUE strengthening.   Follow Up Recommendations  SNF;Supervision for mobility/OOB     Equipment Recommendations  Rolling walker with 5" wheels    Recommendations for Other Services       Precautions / Restrictions Precautions Precautions: Fall Required Braces or Orthoses: Knee Immobilizer - Right Knee Immobilizer - Right: On at all times Restrictions RLE Weight Bearing: Non weight bearing    Mobility  Bed Mobility Overal bed mobility: Modified Independent                Transfers Overall transfer level: Needs assistance Equipment used: Rolling walker (2 wheeled) Transfers: Sit to/from [employer] Sit to Stand: Supervision Stand pivot transfers: Supervision          Ambulation/Gait Ambulation/Gait assistance: Min guard Gait Distance (Feet): 30 Feet Assistive device: Rolling walker (2 wheeled) Gait Pattern/deviations: WFL(Within  Functional Limits)     General Gait Details: c Shoe donnes Left foot, improves ease of effort.   Stairs Stairs: Yes     Number of Stairs: 0 General stair comments: attempted 8" step 3 times, then 4" step 3 times, both with RW, neither successful, pt lacks BUE strength and confidence to perform   Wheelchair Mobility    Modified Rankin (Stroke Patients Only)       Balance                                            Cognition Arousal/Alertness: Awake/alert Behavior During Therapy: WFL for tasks assessed/performed Overall Cognitive Status: Within Functional Limits for tasks assessed                                        Exercises Other Exercises Other Exercises: Arm rest pushups 1x15    General Comments        Pertinent Vitals/Pain Pain Assessment: No/denies pain    Home Living                      Prior Function            PT Goals (current goals can now be found in the care plan section) Acute Rehab PT Goals Patient Stated Goal: return to home, work eventually PT Goal Formulation: With patient Time For Goal Achievement: 02/13/20 Potential to  Achieve Goals: Fair Progress towards PT goals: Progressing toward goals    Frequency    BID      PT Plan Current plan remains appropriate    Co-evaluation              AM-PAC PT "6 Clicks" Mobility   Outcome Measure  Help needed turning from your back to your side while in a flat bed without using bedrails?: None Help needed moving from lying on your back to sitting on the side of a flat bed without using bedrails?: None Help needed moving to and from a bed to a chair (including a wheelchair)?: A Little Help needed standing up from a chair using your arms (e.g., wheelchair or bedside chair)?: A Little Help needed to walk in hospital room?: A Little Help needed climbing 3-5 steps with a railing? : A Lot 6 Click Score: 19    End of Session Equipment  Utilized During Treatment: Right knee immobilizer;Gait belt Activity Tolerance: No increased pain;Patient tolerated treatment well;Patient limited by fatigue Patient left: in chair;with chair alarm set;with call bell/phone within reach Nurse Communication: Weight bearing status;Mobility status PT Visit Diagnosis: Difficulty in walking, not elsewhere classified (R26.2);Muscle weakness (generalized) (M62.81);Dizziness and giddiness (R42)     Time: 1694-5038 PT Time Calculation (min) (ACUTE ONLY): 42 min  Charges:  $Gait Training: 23-37 mins $Therapeutic Exercise: 8-22 mins                     5:02 PM, 01/31/20 Rosamaria Lints, PT, DPT Physical Therapist - [facility]  [phone] (ASCOM)     Emmaleah Meroney C 01/31/2020, 4:59 PM

## 2020-02-01 DIAGNOSIS — F1010 Alcohol abuse, uncomplicated: Secondary | ICD-10-CM | POA: Diagnosis not present

## 2020-02-01 DIAGNOSIS — I428 Other cardiomyopathies: Secondary | ICD-10-CM | POA: Diagnosis not present

## 2020-02-01 DIAGNOSIS — F32A Depression, unspecified: Secondary | ICD-10-CM | POA: Diagnosis not present

## 2020-02-01 DIAGNOSIS — K922 Gastrointestinal hemorrhage, unspecified: Secondary | ICD-10-CM | POA: Diagnosis not present

## 2020-02-01 LAB — BASIC METABOLIC PANEL
Anion gap: 8 (ref 5–15)
BUN: 19 mg/dL (ref 8–23)
CO2: 24 mmol/L (ref 22–32)
Calcium: 7.3 mg/dL — ABNORMAL LOW (ref 8.9–10.3)
Chloride: 108 mmol/L (ref 98–111)
Creatinine, Ser: 0.6 mg/dL — ABNORMAL LOW (ref 0.61–1.24)
GFR, Estimated: >60 mL/min (ref >60)
Glucose, Bld: 93 mg/dL (ref 70–99)
Potassium: 3.5 mmol/L (ref 3.5–5.1)
Sodium: 140 mmol/L (ref 135–145)

## 2020-02-01 NOTE — Progress Notes (Addendum)
Subjective: 3 Days Post-Op Procedure(s) (LRB): OPEN REDUCTION INTERNAL FIXATION (ORIF) DISTAL FEMUR FRACTURE (Right) Patient reports pain as mild.   Patient is well, and has had no acute complaints or problems Current plan is for d/c to SNF when medically able. Patient has had a BM. Negative for chest pain and shortness of breath Fever: no Gastrointestinal:Negative for nausea and vomiting  Objective: Vital signs in last 24 hours: Temp:  [98 F (36.7 C)-99.2 F (37.3 C)] 98.8 F (37.1 C) (10/29 0739) Pulse Rate:  [79-89] 79 (10/29 0739) Resp:  [16-18] 17 (10/29 0739) BP: (112-140)/(81-89) 123/86 (10/29 0739) SpO2:  [97 %-100 %] 97 % (10/29 0739)  Intake/Output from previous day:  Intake/Output Summary (Last 24 hours) at 02/01/2020 0749 Last data filed at 02/01/2020 0625 Gross per 24 hour  Intake 2689.94 ml  Output 750 ml  Net 1939.94 ml    Intake/Output this shift: No intake/output data recorded.  Labs: Recent Labs    01/29/20 1118 01/29/20 1754 01/30/20 0419 01/31/20 0635  HGB 8.4* 7.9* 7.9* 7.9*   Recent Labs    01/30/20 0419 01/31/20 0635  WBC 15.3* 7.0  RBC 2.31* 2.29*  HCT 24.5* 24.1*  PLT 95* 101*   Recent Labs    01/31/20 0635 02/01/20 0607  NA 140 140  K 4.3 3.5  CL 108 108  CO2 28 24  BUN 27* 19  CREATININE 0.71 0.60*  GLUCOSE 86 93  CALCIUM 7.2* 7.3*   No results for input(s): LABPT, INR in the last 72 hours.  EXAM General - Patient is Alert, Appropriate and Oriented Extremity - ABD soft Sensation intact distally Intact pulses distally Dorsiflexion/Plantar flexion intact Incision: scant drainage  Right leg compartments soft to palpation. Negative homans bilaterally. Dressing/Incision - blood tinged drainage Motor Function - intact, moving foot and toes well on exam.  Able to perform a straight leg raise with assistance.  Past Medical History:  Diagnosis Date  . Arthritis   . Diabetes (HCC)   . Hyperlipemia   . Hypertension    . Sleep apnea    Assessment/Plan: 3 Days Post-Op Procedure(s) (LRB): OPEN REDUCTION INTERNAL FIXATION (ORIF) DISTAL FEMUR FRACTURE (Right) Principal Problem:   Alcohol abuse Active Problems:   Acute GI bleeding   Diabetes mellitus type 2, uncomplicated (HCC)   Cardiomyopathy, idiopathic (HCC)   Hyperlipidemia   Paroxysmal A-fib (HCC)   Obesity   Hypertension   Conceited it was a great history will call admit  Estimated body mass index is 25.5 kg/m as calculated from the following:   Height as of this encounter: 6' (1.829 m).   Weight as of this encounter: 85.3 kg. Advance diet Up with therapy D/C IV fluids when tolerating po intake. Hgb trending up - Hgb 8.3 VSS Patient has had a BM since surgery. Current plan is for d/c to SNF.  Plan for possible d/c today. Keep brace locked in extension when ambulating. Can unlock brace when not moving around and work on some gentle flexion of the knee.  Will continue on Eliquis following discharge for DVT prevention. Follow-up with [facility] in 10-14 days for staple removal.  DVT Prophylaxis - TED hose and Eliquis Non-weightbearing to right leg  T. Cranston Neighbor, PA-C [facility] 02/01/2020, 7:49 AM

## 2020-02-01 NOTE — TOC Progression Note (Signed)
Transition of Care [facility]) - Progression Note    Patient Details  Name: James Sanford MRN: [id number] Date of Birth: 26-Jan-1956  Transition of [facility]) CM/SW Contact  Liliana Cline, LCSW Phone Number: 02/01/2020, 1:21 PM  Clinical Narrative:   Reached out to Tammy with [employer] about insurance authorization. She reported she does not have any updates and does not expect to get authorization today.    Expected Discharge Plan: Home/Self Care Barriers to Discharge: Continued Medical Work up  Expected Discharge Plan and Services Expected Discharge Plan: Home/Self Care In-house Referral: Clinical Social Work     Living arrangements for the past 2 months: Single Family Home                                       Social Determinants of Health (SDOH) Interventions    Readmission Risk Interventions No flowsheet data found.

## 2020-02-01 NOTE — Progress Notes (Addendum)
[facility] Progress Note  Patient: James Sanford    JJH:[id number]  DOA: 01/25/2020     Date of Service: the patient was seen and examined on 02/01/2020  Brief hospital course: Past medical history of HTN, A. fib, OSA, HLD, type II DM, alcohol abuse.  Presents with GI bleed history. SP EGD.    Assessment and Plan: 1.  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 Upper GI endoscopy negative for any acute bleeding. Currently on Protonix p.o. Octreotide was initiated but discontinued now. Colonoscopy was performed twice.  Prep was poor twice.  No acute bleeding reported.  No acute intervention was performed other than biopsy of the polyp. Continue Eliquis Monitor H&H and transfuse hemoglobin less than 7.  2.  Alcohol abuse No evidence of withdrawal for now. Continue CIWA protocol for now.  3.  Type 2 diabetes mellitus, controlled. Continue sliding scale insulin for now.  4.  Paroxysmal A. fib On Eliquis. Rate currently controlled.  5.  Closed minimally displaced periprosthetic right supracondylar femur fracture X-ray actually shows periprosthetic fracture. Status post ORIF by Ortho on 10/26  6.  Thrombocytopenia, chronic, improving Mild worsening right now likely in the setting of PRBC transfusion. In the setting of alcohol abuse. Monitor for now.  Supportive measures  7.  Emphysema Emphysematous changes Seen on CT scan. Likely patient has COPD.  Outpatient work-up recommended.  8.  Depression. Concern for suicidal ideation. Psychiatry input appreciated. Patient does not have suicidal ideation right now. Does not meet inpatient psychiatric criteria. Discontinue sitter.  9.  Hypotension. Resolved now  10.  Leukocytosis. Likely stress reaction. Monitor.  Patient does not have any acute complaints.  Diet: Regular diet.  DVT Prophylaxis:   Place TED hose Start: 01/29/20 1724 Foot Pump / plexipulse Start: 01/29/20 1724    Advance goals of care  discussion: Full code  Family Communication: Updated patient's wife [name] over phone @ [phone] on 02/01/2020  Disposition:  Status is: Inpatient  Remains inpatient appropriate because:Ongoing diagnostic testing needed not appropriate for outpatient work up   Dispo: The patient is from: Home              Anticipated d/c is to: SNF              Anticipated d/c date is: 1 day              Patient currently is medically stable to d/c. Still waiting on insurance authorization for [facility]  Subjective: No new complaints. Comfortable  Physical Exam:  General: Appear in mild distress, no Rash; Oral Mucosa Clear, moist. no Abnormal Neck Mass Or lumps, Conjunctiva normal  Cardiovascular: S1 and S2 Present, no Murmur, Respiratory: good respiratory effort, Bilateral Air entry present and CTA, no Crackles, no wheezes Abdomen: Bowel Sound present, Soft and no tenderness Extremities: no Pedal edema, blood tinged drainage  Neurology: alert and oriented to time, place, and person affect appropriate. no new focal deficit Gait not checked due to patient safety concerns   Vitals:   01/31/20 0721 01/31/20 1508 01/31/20 2341 02/01/20 0739  BP: 128/89 112/81 140/89 123/86  Pulse: 66 89 79 79  Resp: 18 18 16 17   Temp: 97.8 F (36.6 C) 98 F (36.7 C) 99.2 F (37.3 C) 98.8 F (37.1 C)  TempSrc: Oral Oral Oral Oral  SpO2: 100% 98% 100% 97%  Weight:      Height:        Intake/Output Summary (Last 24 hours) at 02/01/2020 1322 Last data  filed at 02/01/2020 8786 Gross per 24 hour  Intake 2209.94 ml  Output 750 ml  Net 1459.94 ml   Filed Weights   01/25/20 1526 01/27/20 1034  Weight: 81.6 kg 85.3 kg    Data Reviewed: I have personally reviewed and interpreted daily labs, tele strips, imagings as discussed above. I reviewed all nursing notes, pharmacy notes, vitals, pertinent old records I have discussed plan of care as described above with RN and patient/family.  CBC: Recent  Labs  Lab 01/25/20 1543 01/25/20 1754 01/25/20 2204 01/26/20 0421 01/27/20 0901 01/27/20 1254 01/27/20 1929 01/28/20 0313 01/28/20 1402 01/29/20 1118 01/29/20 1754 01/30/20 0419 01/31/20 0635  WBC 10.2   < > 8.5   < > 4.9   < > 8.3  --  6.5 17.2* 14.1* 15.3* 7.0  NEUTROABS 9.0*  --  6.9  --  3.8  --  6.4  --   --   --   --  13.9*  --   HGB 8.7*   < > 8.4*   < > 7.7*   < > 8.0*   < > 8.4* 8.4* 7.9* 7.9* 7.9*  HCT 25.7*   < > 25.0*   < > 23.1*   < > 24.4*   < > 25.7* 25.2* 24.7* 24.5* 24.1*  MCV 101.2*   < > 100.4*   < > 102.2*   < > 104.7*  --  106.2* 104.1* 106.0* 106.1* 105.2*  PLT 123*   < > 114*   < > 85*   < > 104*  --  103* 96* 92* 95* 101*   < > = values in this interval not displayed.   Basic Metabolic Panel: Recent Labs  Lab 01/27/20 0901 01/27/20 0901 01/28/20 0313 01/29/20 1118 01/30/20 0419 01/31/20 0635 02/01/20 0607  NA 139   < > 140 141 139 140 140  K 3.7   < > 3.8 4.0 4.6 4.3 3.5  CL 108   < > 110 108 108 108 108  CO2 26   < > 25 25 25 28 24   GLUCOSE 138*   < > 101* 123* 156* 86 93  BUN 9   < > 7* 16 25* 27* 19  CREATININE 0.72   < > 0.70 0.95 1.00 0.71 0.60*  CALCIUM 7.5*   < > 7.7* 7.5* 7.5* 7.2* 7.3*  MG 2.5*  --  2.2  --  2.2  --   --    < > = values in this interval not displayed.    Studies: No results found.  Scheduled Meds: . apixaban  5 mg Oral BID  . B-complex with vitamin C  1 tablet Oral Daily  . docusate sodium  100 mg Oral BID  . folic acid  1 mg Oral Daily  . lidocaine  1 patch Transdermal Q24H  . multivitamin with minerals  1 tablet Oral Daily  . omega-3 acid ethyl esters  1 g Oral Daily  . pantoprazole  40 mg Oral BID AC  . thiamine  100 mg Oral Daily   Or  . thiamine  100 mg Intravenous Daily  . vitamin B-12  1,000 mcg Oral Daily   Continuous Infusions: . lactated ringers 125 mL/hr at 01/31/20 1611   PRN Meds: acetaminophen, bisacodyl, diphenhydrAMINE, magnesium hydroxide, metoCLOPramide **OR** metoCLOPramide (REGLAN)  injection, morphine injection, ondansetron **OR** ondansetron (ZOFRAN) IV, oxyCODONE, sodium phosphate, traMADol  Time spent: 35 minutes  Author: 02/02/20, MD [facility] 02/01/2020 1:22 PM  To  reach On-call, see care teams to locate the attending and reach out via [email or web address]. Between 7PM-7AM, please contact night-coverage If you still have difficulty reaching the attending provider, please page the [facility] (Director on Call) for [facility] on amion for assistance.

## 2020-02-01 NOTE — Progress Notes (Signed)
Physical Therapy Treatment Patient Details Name: James Sanford MRN: [id number] DOB: January 13, 1956 Today's Date: 02/01/2020    History of Present Illness James Sanford is a 64yoM (PMH: ETOH abuse, DM, HLD, PAF, HTN, OSA) who comes to [facility] 10/22 after a fall at home Sanford subsequent Rt knee pain. PTA pt experiencing BRBPR. Imaging reveals Rt femur frature. Underwent EGD 10/24, colonoscopy 10/25. Underwent ORIF of Right distal femur fracture (periprosthetic) Sanford Dr. Joice Lofts on 10/26. Pt is NWB RLE postoperatively.    PT Comments    Pt in bed upon entry, agreeable to participate. Pt reports compliance with HEP printout from yesterday. modI bed mobility, modI transfers. Pt reports SIL measured his entry steps at 5.5 inches in height. Pt able to progress AMB to ~37ft but requires standing rest Q48ft, and moving speed remains very slow. Pt requires no cues for weight bearing restrictions. Pt progresses posterior step training from 1" surface to 2" surface, but has difficulty with clearing step fully, weakness of hopping leg. Pt educated on heel raises at EOS. Pt agreeable to up to chair.      Follow Up Recommendations  SNF;Supervision for mobility/OOB     Equipment Recommendations  Rolling walker with 5" wheels    Recommendations for Other Services       Precautions / Restrictions Precautions Precautions: Fall Required Braces or Orthoses: Knee Immobilizer - Right Knee Immobilizer - Right: On at all times Restrictions Weight Bearing Restrictions: No RLE Weight Bearing: Weight bearing as tolerated    Mobility  Bed Mobility Overal bed mobility: Modified Independent                Transfers Overall transfer level: Modified independent Equipment used: Rolling walker (2 wheeled) Transfers: Sit to/from Stand Sit to Stand: Modified independent (Device/Increase time) Stand pivot transfers: Modified independent (Device/Increase time)          Ambulation/Gait Ambulation/Gait  assistance: Min guard Gait Distance (Feet): 72 Feet Assistive device: Rolling walker (2 wheeled) Gait Pattern/deviations: WFL(Within Functional Limits) Gait velocity: 0.0152 m/s (1.52cm/sec)   General Gait Details: Lt shoe donned; 2 point hop-to gait with RLE NWB. stops to stand recovery Q15ft;   Stairs             Wheelchair Mobility    Modified Rankin (Stroke Patients Only)       Balance                                            Cognition Arousal/Alertness: Awake/alert Behavior During Therapy: WFL for tasks assessed/performed Overall Cognitive Status: Within Functional Limits for tasks assessed                                        Exercises General Exercises - Lower Extremity Short Arc Quad: PROM;AAROM;Right;Supine;15 reps;Limitations Short Arc Quad Limitations: 0-30 degrees Other Exercises Other Exercises: 2" step training 8x Sanford RW, difficulty clearing step Other Exercises: BUE supported LLE heel raises 1x15 standing    General Comments        Pertinent Vitals/Pain Pain Assessment: No/denies pain    Home Living                      Prior Function            PT Goals (  current goals can now be found in the care plan section)      Frequency    BID      PT Plan Current plan remains appropriate    Co-evaluation              AM-PAC PT "6 Clicks" Mobility   Outcome Measure  Help needed turning from your back to your side while in a flat bed without using bedrails?: None Help needed moving from lying on your back to sitting on the side of a flat bed without using bedrails?: None Help needed moving to and from a bed to a chair (including a wheelchair)?: A Little Help needed standing up from a chair using your arms (e.g., wheelchair or bedside chair)?: A Little Help needed to walk in hospital room?: A Little Help needed climbing 3-5 steps with a railing? : Total 6 Click Score: 18    End of  Session Equipment Utilized During Treatment: Right knee immobilizer;Gait belt Activity Tolerance: No increased pain;Patient tolerated treatment well;Patient limited by fatigue Patient left: in chair;with chair alarm set;with call bell/phone within reach Nurse Communication: Weight bearing status;Mobility status PT Visit Diagnosis: Difficulty in walking, not elsewhere classified (R26.2);Muscle weakness (generalized) (M62.81);Dizziness and giddiness (R42)     Time: 6967-8938 PT Time Calculation (min) (ACUTE ONLY): 53 min  Charges:  $Gait Training: 23-37 mins $Therapeutic Exercise: 23-37 mins                12:54 PM, 02/01/20 Rosamaria Lints, PT, DPT Physical Therapist - [facility]  [phone] (ASCOM)     James Sanford 02/01/2020, 12:51 PM

## 2020-02-01 NOTE — Evaluation (Signed)
Occupational Therapy Evaluation Patient Details Name: James Sanford MRN: [id number] DOB: 06-26-1955 Today's Date: 02/01/2020    History of Present Illness James Sanford is a 64yoM (PMH: ETOH abuse, DM, HLD, PAF, HTN, OSA) who comes to [facility] 10/22 after a fall at home c subsequent Rt knee pain. PTA pt experiencing BRBPR. Imaging reveals Rt femur frature. Underwent EGD 10/24, colonoscopy 10/25. Underwent ORIF of Right distal femur fracture (periprosthetic) c Dr. Joice Lofts on 10/26. Pt is NWB RLE postoperatively.   Clinical Impression   James Sanford was seen for OT evaluation this date. Pt was independent in all ADLs prior to surgery, and endorses working as a Photographer. Pt is eager to return to PLOF with less pain and improved safety and independence. Pt currently requires MOD assist for LB dressing and bathing while in seated position due to pain and limited AROM of his RLE. Pt instructed in self care skills, falls prevention strategies, home/routines modifications, polar care management strategies, DME/AE for LB bathing and dressing tasks, and compression stocking mgt strategies. Pt would benefit from additional instruction in self care skills and techniques to help maintain precautions with or without assistive devices to support recall and carryover prior to discharge. Recommend STR upon hospital DC to maximize pt safety and return to PLOF.      Follow Up Recommendations  SNF    Equipment Recommendations  3 in 1 bedside commode    Recommendations for Other Services       Precautions / Restrictions Precautions Precautions: Fall Required Braces or Orthoses: Knee Immobilizer - Right Knee Immobilizer - Right: On at all times Restrictions Weight Bearing Restrictions: Yes RLE Weight Bearing: Non weight bearing      Mobility Bed Mobility Overal bed mobility: Modified Independent             General bed mobility comments: Pt able to come to sitting upright at EOB  with good use of hospital bed functions and bed rails.    Transfers Overall transfer level: Modified independent Equipment used: Rolling walker (2 wheeled) Transfers: Sit to/from Stand Sit to Stand: Modified independent (Device/Increase time) Stand pivot transfers: Modified independent (Device/Increase time)       General transfer comment: Deferred. Pt reclines functional mobility at this time in anticipation of later PT session this date.    Balance Overall balance assessment: History of Falls;Needs assistance Sitting-balance support: Feet supported;No upper extremity supported Sitting balance-Leahy Scale: Good Sitting balance - Comments: Steady static sitting, reaching within BOS.                                   ADL either performed or assessed with clinical judgement   ADL Overall ADL's : Needs assistance/impaired                                       General ADL Comments: Pt is functionally limited by decreased AROM of his RLE, NWB status, and decreased LB access 2/2 KI. He is able to don a hospital sock on his non-operative extremity while long sitting in bed. He requires MOD A for LB dressing of his RLE. Supervision for safety during functional mboility and toileting. Set-up/supervision for Seated UB ADL mgt.     Vision Patient Visual Report: No change from baseline       Perception  Praxis      Pertinent Vitals/Pain Pain Assessment: No/denies pain     Hand Dominance Right   Extremity/Trunk Assessment Upper Extremity Assessment Upper Extremity Assessment: Overall WFL for tasks assessed   Lower Extremity Assessment Lower Extremity Assessment: Generalized weakness;RLE deficits/detail RLE Deficits / Details: RLE NWB with KI on at all times locked in extension. RLE: Unable to fully assess due to immobilization RLE Coordination: decreased gross motor;decreased fine motor   Cervical / Trunk Assessment Cervical / Trunk  Assessment: Normal   Communication Communication Communication: No difficulties   Cognition Arousal/Alertness: Awake/alert Behavior During Therapy: WFL for tasks assessed/performed Overall Cognitive Status: Within Functional Limits for tasks assessed                                     General Comments  KI donned at start/end of session.    Exercises General Exercises - Lower Extremity Short Arc Quad: PROM;AAROM;Right;Supine;15 reps;Limitations Short Arc Quad Limitations: 0-30 degrees Other Exercises Other Exercises: Pt/caregiver educated on role of OT in acute setting, safe use of AE/DME for ADL management including strategies for LB dressing, falls prevention strategies, and routines modifications to support safety and functional indep upon hospital DC. Other Exercises: 2" step training 8x c RW, difficulty clearing step Other Exercises: BUE supported LLE heel raises 1x15 standing   Shoulder Instructions      Home Living Family/patient expects to be discharged to:: Private residence Living Arrangements: Spouse/significant other Available Help at Discharge: Family Type of Home: House Home Access: Stairs to enter Secretary/administrator of Steps: 5   Home Layout: Two level;1/2 bath on main level Alternate Level Stairs-Number of Steps: full flight, has a stair lift   Bathroom Shower/Tub: Tub/shower unit;Walk-in shower (Both on second floor.)   Bathroom Toilet: Handicapped height     Home Equipment: Scientist, physiological Equipment: Reacher Additional Comments: Pt states ye used to have 2WW and BSC but no longer has them. Will need this equip upon DC.      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        OT Problem List: Decreased strength;Decreased coordination;Decreased safety awareness;Impaired balance (sitting and/or standing);Decreased knowledge of use of DME or  AE;Decreased knowledge of precautions      OT Treatment/Interventions: Self-care/ADL training;Therapeutic exercise;Therapeutic activities;DME and/or AE instruction;Balance training;Patient/family education    OT Goals(Current goals can be found in the care plan section) Acute Rehab OT Goals Patient Stated Goal: return to home, work eventually OT Goal Formulation: With patient/family Time For Goal Achievement: 02/15/20 Potential to Achieve Goals: Good ADL Goals Pt Will Perform Lower Body Dressing: sit to/from stand;with supervision;with set-up;with adaptive equipment (c LRAD PRN for improved safety and functional indep.) Pt Will Transfer to Toilet: bedside commode;with supervision;with set-up (c LRAD PRN for improved safety and functional indep.) Pt Will Perform Toileting - Clothing Manipulation and hygiene: with adaptive equipment;sit to/from stand;with supervision;with set-up (c LRAD PRN for improved safety and functional indep.)  OT Frequency: Min 1X/week   Barriers to D/C: Inaccessible home environment          Co-evaluation              AM-PAC OT "6 Clicks" Daily Activity     Outcome Measure Help from another person eating meals?: A Little Help from another person taking care of personal grooming?: A Little  Help from another person toileting, which includes using toliet, bedpan, or urinal?: A Little Help from another person bathing (including washing, rinsing, drying)?: A Little Help from another person to put on and taking off regular upper body clothing?: A Little Help from another person to put on and taking off regular lower body clothing?: A Lot 6 Click Score: 17   End of Session Equipment Utilized During Treatment: Other (comment) (LH reacher, sock aid.)  Activity Tolerance: Patient tolerated treatment well Patient left: in bed;with call bell/phone within reach;with bed alarm set;with family/visitor present  OT Visit Diagnosis: Other abnormalities of gait and  mobility (R26.89)                Time: 0938-1829 OT Time Calculation (min): 33 min Charges:  OT General Charges $OT Visit: 1 Visit OT Evaluation $OT Eval Moderate Complexity: 1 Mod OT Treatments $Self Care/Home Management : 23-37 mins  Rockney Ghee, M.S., OTR/L Ascom: [phone] 02/01/20, 3:38 PM

## 2020-02-01 NOTE — Plan of Care (Signed)
Problem: Education: Goal: Knowledge of General Education information will improve Description: Including pain rating scale, medication(s)/side effects and non-pharmacologic comfort measures 02/01/2020 0658 by Garwin Brothers, RN Outcome: Progressing 02/01/2020 0658 by Garwin Brothers, RN Outcome: Progressing   Problem: Health Behavior/Discharge Planning: Goal: Ability to manage health-related needs will improve 02/01/2020 0658 by Garwin Brothers, RN Outcome: Progressing 02/01/2020 0658 by Garwin Brothers, RN Outcome: Progressing   Problem: Clinical Measurements: Goal: Ability to maintain clinical measurements within normal limits will improve 02/01/2020 0658 by Garwin Brothers, RN Outcome: Progressing 02/01/2020 0658 by Garwin Brothers, RN Outcome: Progressing Goal: Will remain free from infection 02/01/2020 0658 by Garwin Brothers, RN Outcome: Progressing 02/01/2020 0658 by Garwin Brothers, RN Outcome: Progressing Goal: Diagnostic test results will improve 02/01/2020 0658 by Garwin Brothers, RN Outcome: Progressing 02/01/2020 0658 by Garwin Brothers, RN Outcome: Progressing Goal: Respiratory complications will improve 02/01/2020 0658 by Garwin Brothers, RN Outcome: Progressing 02/01/2020 0658 by Garwin Brothers, RN Outcome: Progressing Goal: Cardiovascular complication will be avoided 02/01/2020 0658 by Garwin Brothers, RN Outcome: Progressing 02/01/2020 0658 by Garwin Brothers, RN Outcome: Progressing   Problem: Activity: Goal: Risk for activity intolerance will decrease 02/01/2020 0658 by Garwin Brothers, RN Outcome: Progressing 02/01/2020 0658 by Garwin Brothers, RN Outcome: Progressing   Problem: Nutrition: Goal: Adequate nutrition will be maintained 02/01/2020 0658 by Garwin Brothers, RN Outcome: Progressing 02/01/2020 0658 by Garwin Brothers, RN Outcome: Progressing   Problem: Coping: Goal: Level of anxiety will decrease 02/01/2020 0658 by Garwin Brothers, RN Outcome: Progressing 02/01/2020 0658 by Garwin Brothers, RN Outcome: Progressing   Problem: Elimination: Goal: Will not experience complications related to bowel motility 02/01/2020 0658 by Garwin Brothers, RN Outcome: Progressing 02/01/2020 0658 by Garwin Brothers, RN Outcome: Progressing Goal: Will not experience complications related to urinary retention 02/01/2020 0658 by Garwin Brothers, RN Outcome: Progressing 02/01/2020 0658 by Garwin Brothers, RN Outcome: Progressing   Problem: Pain Managment: Goal: General experience of comfort will improve 02/01/2020 0658 by Garwin Brothers, RN Outcome: Progressing 02/01/2020 0658 by Garwin Brothers, RN Outcome: Progressing   Problem: Safety: Goal: Ability to remain free from injury will improve 02/01/2020 0658 by Garwin Brothers, RN Outcome: Progressing 02/01/2020 0658 by Garwin Brothers, RN Outcome: Progressing   Problem: Skin Integrity: Goal: Risk for impaired skin integrity will decrease 02/01/2020 0658 by Garwin Brothers, RN Outcome: Progressing 02/01/2020 0658 by Garwin Brothers, RN Outcome: Progressing   Problem: Education: Goal: Knowledge of disease or condition will improve 02/01/2020 0658 by Garwin Brothers, RN Outcome: Progressing 02/01/2020 0658 by Garwin Brothers, RN Outcome: Progressing Goal: Understanding of medication regimen will improve 02/01/2020 0658 by Garwin Brothers, RN Outcome: Progressing 02/01/2020 0658 by Garwin Brothers, RN Outcome: Progressing Goal: Individualized Educational Video(s) 02/01/2020 0658 by Garwin Brothers, RN Outcome: Progressing 02/01/2020 0658 by Garwin Brothers, RN Outcome: Progressing   Problem: Activity: Goal: Ability to tolerate increased activity will improve 02/01/2020 0658 by Garwin Brothers, RN Outcome: Progressing 02/01/2020 0658 by Garwin Brothers, RN Outcome: Progressing   Problem: Cardiac: Goal: Ability to achieve and maintain adequate  cardiopulmonary perfusion will improve 02/01/2020 0658 by Garwin Brothers, RN Outcome: Progressing 02/01/2020 0658 by Garwin Brothers, RN Outcome: Progressing   Problem: Health Behavior/Discharge Planning: Goal: Ability to safely manage health-related needs after discharge will improve 02/01/2020 0658 by Garwin Brothers,  RN Outcome: Progressing 02/01/2020 0658 by Garwin Brothers, RN Outcome: Progressing

## 2020-02-01 NOTE — Progress Notes (Signed)
Physical Therapy Treatment Patient Details Name: James Sanford MRN: [id number] DOB: [birth year] Today's Date: 02/01/2020    History of Present Illness James Sanford is a 64yoM (PMH: ETOH abuse, DM, HLD, PAF, HTN, OSA) who comes to [facility] 10/22 after a fall at home c subsequent Rt knee pain. PTA pt experiencing BRBPR. Imaging reveals Rt femur frature. Underwent EGD 10/24, colonoscopy 10/25. Underwent ORIF of Right distal femur fracture (periprosthetic) c Dr. Joice Lofts on 10/26. Pt is NWB RLE postoperatively.    PT Comments    Pt asleep upon entry, easily awakened with boisterous greeting. Pt agreeable to PT session. Emphasized HEP compliance with patient. Continued with STS training from low surfaces. HEP reviewed whilst chaired. Pt performs posterior stair ascent (2" step) c RW, LLE hop-step, three sets of six repetitions. Pt performs with gusto, confidence, but alas has obvious fatigue limitations. Pt frustrated by pace of progress and persistent limitations, however continued to improve his mobility each session. Will continue to follow.   Follow Up Recommendations  SNF;Supervision for mobility/OOB     Equipment Recommendations  Rolling walker with 5" wheels    Recommendations for Other Services       Precautions / Restrictions Precautions Precautions: Fall Required Braces or Orthoses: Knee Immobilizer - Right Knee Immobilizer - Right: On at all times Restrictions Weight Bearing Restrictions: Yes RLE Weight Bearing: Non weight bearing    Mobility  Bed Mobility Overal bed mobility: Modified Independent             General bed mobility comments: Pt able to come to sitting upright at EOB with good use of hospital bed functions and bed rails.  Transfers Overall transfer level: Needs assistance Equipment used: Rolling walker (2 wheeled) Transfers: Sit to/from Stand Sit to Stand: Supervision         General transfer comment: still struggles at times with body set  up, needs cues to bring Left foot back under body.  Ambulation/Gait Ambulation/Gait assistance:  (none this session)               Stairs             Wheelchair Mobility    Modified Rankin (Stroke Patients Only)       Balance Overall balance assessment: History of Falls;Needs assistance Sitting-balance support: Feet supported;No upper extremity supported Sitting balance-Leahy Scale: Good Sitting balance - Comments: Steady static sitting, reaching within BOS.                                    Cognition Arousal/Alertness: Awake/alert Behavior During Therapy: WFL for tasks assessed/performed Overall Cognitive Status: Within Functional Limits for tasks assessed                                        Exercises General Exercises - Lower Extremity Short Arc Quad: PROM;AAROM;Right;Supine;15 reps;Limitations Short Arc Quad Limitations: 0-30 degrees Other Exercises Other Exercises: STS from EOB 6x c RW Other Exercises: No Money 1x15x3secH Other Exercises: Chair Pushups 1x10x3secH Other Exercises: 2" step training c RW; 3x6, rest break between taken standing Other Exercises: BUE supported LLE heel raises 1x15 standing    General Comments General comments (skin integrity, edema, etc.): KI donned at start/end of session.      Pertinent Vitals/Pain Pain Assessment: No/denies pain    Home Living Family/patient expects  to be discharged to:: Private residence Living Arrangements: Spouse/significant other Available Help at Discharge: Family Type of Home: House Home Access: Stairs to enter   Home Layout: Two level;1/2 bath on main level Home Equipment: Adaptive equipment Additional Comments: Pt states ye used to have 2WW and BSC but no longer has them. Will need this equip upon DC.    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   PT Goals (current goals can now be found  in the care plan section) Acute Rehab PT Goals Patient Stated Goal: return to home, work eventually PT Goal Formulation: With patient Time For Goal Achievement: 02/13/20 Potential to Achieve Goals: Fair Progress towards PT goals: Progressing toward goals    Frequency    BID      PT Plan Current plan remains appropriate    Co-evaluation              AM-PAC PT "6 Clicks" Mobility   Outcome Measure  Help needed turning from your back to your side while in a flat bed without using bedrails?: None Help needed moving from lying on your back to sitting on the side of a flat bed without using bedrails?: None Help needed moving to and from a bed to a chair (including a wheelchair)?: A Little Help needed standing up from a chair using your arms (e.g., wheelchair or bedside chair)?: A Little Help needed to walk in hospital room?: A Little Help needed climbing 3-5 steps with a railing? : Total 6 Click Score: 18    End of Session Equipment Utilized During Treatment: Right knee immobilizer;Gait belt Activity Tolerance: No increased pain;Patient tolerated treatment well;Patient limited by fatigue Patient left: in chair;with chair alarm set;with call bell/phone within reach Nurse Communication: Weight bearing status;Mobility status PT Visit Diagnosis: Difficulty in walking, not elsewhere classified (R26.2);Muscle weakness (generalized) (M62.81);Dizziness and giddiness (R42)     Time: 1547-1610 PT Time Calculation (min) (ACUTE ONLY): 23 min  Charges:  $Gait Training: 8-22 mins $Therapeutic Exercise: 8-22 mins                     4:50 PM, 02/01/20 Rosamaria Lints, PT, DPT Physical Therapist - [facility]  [phone] (ASCOM)    Tynika Luddy C 02/01/2020, 4:47 PM

## 2020-02-02 DIAGNOSIS — K922 Gastrointestinal hemorrhage, unspecified: Secondary | ICD-10-CM | POA: Diagnosis not present

## 2020-02-02 DIAGNOSIS — F1010 Alcohol abuse, uncomplicated: Secondary | ICD-10-CM | POA: Diagnosis not present

## 2020-02-02 DIAGNOSIS — I428 Other cardiomyopathies: Secondary | ICD-10-CM | POA: Diagnosis not present

## 2020-02-02 DIAGNOSIS — F32A Depression, unspecified: Secondary | ICD-10-CM | POA: Diagnosis not present

## 2020-02-02 LAB — BASIC METABOLIC PANEL
Anion gap: 6 (ref 5–15)
BUN: 13 mg/dL (ref 8–23)
CO2: 27 mmol/L (ref 22–32)
Calcium: 7.5 mg/dL — ABNORMAL LOW (ref 8.9–10.3)
Chloride: 108 mmol/L (ref 98–111)
Creatinine, Ser: 0.53 mg/dL — ABNORMAL LOW (ref 0.61–1.24)
GFR, Estimated: >60 mL/min (ref >60)
Glucose, Bld: 91 mg/dL (ref 70–99)
Potassium: 3.5 mmol/L (ref 3.5–5.1)
Sodium: 141 mmol/L (ref 135–145)

## 2020-02-02 LAB — CBC
HCT: 25.2 % — ABNORMAL LOW (ref 39.0–52.0)
Hemoglobin: 8.3 g/dL — ABNORMAL LOW (ref 13.0–17.0)
MCH: 33.7 pg (ref 26.0–34.0)
MCHC: 32.9 g/dL (ref 30.0–36.0)
MCV: 102.4 fL — ABNORMAL HIGH (ref 80.0–100.0)
Platelets: 173 10*3/uL (ref 150–400)
RBC: 2.46 MIL/uL — ABNORMAL LOW (ref 4.22–5.81)
RDW: 13.8 % (ref 11.5–15.5)
WBC: 5.4 10*3/uL (ref 4.0–10.5)
nRBC: 0 % (ref 0.0–0.2)

## 2020-02-02 NOTE — Progress Notes (Signed)
Physical Therapy Treatment Patient Details Name: James Sanford MRN: [id number] DOB: 10-30-1955 Today's Date: 02/02/2020    History of Present Illness James Sanford is a 64yoM (PMH: ETOH abuse, DM, HLD, PAF, HTN, OSA) who comes to [facility] 10/22 after a fall at home c subsequent Rt knee pain. PTA pt experiencing BRBPR. Imaging reveals Rt femur frature. Underwent EGD 10/24, colonoscopy 10/25. Underwent ORIF of Right distal femur fracture (periprosthetic) c Dr. Joice Lofts on 10/26. Pt is NWB RLE postoperatively.    PT Comments    Pt was sitting on BSC with breakfast tray/ table in front of him. He was very frustrated about receiving wrong breakfast order.  Re-ordered correct desired food. He agrees to session and was cooperative throughout remainder of session. Stood from [facility] to RW and performed hop to gait pattern x 80 ft with ability to maintain proper NWB RLE. Does fatigue quickly but overall demonstrates safe gait. Pt is progressing well towards PT goals. Will return for afternoon session to focus on stair training. Pt has a stairs to enter exit house and currently unable to safely navigate. SNF recommended to continue to focus on stairs and progress pt to PLOF. He was in recliner post session with call bell in reach and RN staff aware of abilities.    Follow Up Recommendations  SNF;Supervision for mobility/OOB (pt has stairs to enter home...will benefit from rehab prior)     Equipment Recommendations  Rolling walker with 5" wheels    Recommendations for Other Services       Precautions / Restrictions Precautions Precautions: Fall Required Braces or Orthoses: Knee Immobilizer - Right Knee Immobilizer - Right: On at all times Restrictions Weight Bearing Restrictions: No RLE Weight Bearing: Weight bearing as tolerated    Mobility  Bed Mobility    General bed mobility comments: pt was on BSC upon arriving and in recliner post session  Transfers Overall transfer level: Needs  assistance Equipment used: Rolling walker (2 wheeled) Transfers: Sit to/from Stand Sit to Stand: Supervision         General transfer comment: pt was able to stand from [facility] and from recliner with supervision only. occasional vcs for imporved technique  Ambulation/Gait Ambulation/Gait assistance: Min guard Gait Distance (Feet): 80 Feet Assistive device: Rolling walker (2 wheeled) Gait Pattern/deviations: WFL(Within Functional Limits) ("hop to") Gait velocity: decreased   General Gait Details: Pt has LLE shoe donned during ambulation. easily able to perform hop to pattern while adhereing to NWB. occasional resting on foot on floor 2/2 to fatigue. Overall tolerated well         Balance Overall balance assessment: History of Falls;Needs assistance Sitting-balance support: Feet supported;No upper extremity supported Sitting balance-Leahy Scale: Good Sitting balance - Comments: Steady static sitting, reaching within BOS.   Standing balance support: Bilateral upper extremity supported;During functional activity Standing balance-Leahy Scale: Fair Standing balance comment: reliant on BUE support to maintain balance and proper wt bearing       Cognition Arousal/Alertness: Awake/alert Behavior During Therapy: WFL for tasks assessed/performed;Agitated (slightly agitated at first) Overall Cognitive Status: Within Functional Limits for tasks assessed        General Comments: Pt is A and O x 4. initially very frustrated about care and dinning services but throughout session improved irrtability             Pertinent Vitals/Pain Pain Assessment: No/denies pain           PT Goals (current goals can now be found in the care plan  section) Acute Rehab PT Goals Patient Stated Goal: get better so I can go home Progress towards PT goals: Progressing toward goals    Frequency    BID      PT Plan Current plan remains appropriate       AM-PAC PT "6 Clicks" Mobility    Outcome Measure  Help needed turning from your back to your side while in a flat bed without using bedrails?: None Help needed moving from lying on your back to sitting on the side of a flat bed without using bedrails?: None Help needed moving to and from a bed to a chair (including a wheelchair)?: A Little Help needed standing up from a chair using your arms (e.g., wheelchair or bedside chair)?: A Little Help needed to walk in hospital room?: A Little Help needed climbing 3-5 steps with a railing? : Total 6 Click Score: 18    End of Session Equipment Utilized During Treatment: Right knee immobilizer;Gait belt Activity Tolerance: Patient tolerated treatment well;Patient limited by fatigue Patient left: in chair;with chair alarm set;with call bell/phone within reach Nurse Communication: Weight bearing status;Mobility status PT Visit Diagnosis: Difficulty in walking, not elsewhere classified (R26.2);Muscle weakness (generalized) (M62.81);Dizziness and giddiness (R42)     Time: 5188-4166 PT Time Calculation (min) (ACUTE ONLY): 17 min  Charges:  $Gait Training: 8-22 mins                     Jetta Lout PTA 02/02/20, 12:04 PM

## 2020-02-02 NOTE — Progress Notes (Signed)
CH encountered pt.'s wife Dodi in 1st floor hallway; wife shared pt. has been admitted for alcohol abuse and suffers from depression; wife requested CH attempt to talk w/pt. to see if spiritual support might be of encouragement to him.  Pt. and wife are both graduates of [employer] and pt. was looking forward to being at homecoming this weekend.  CH attempted visit at 7pm, but pt. was asleep.  Will pass along referral to Saturday chaplain.

## 2020-02-02 NOTE — Progress Notes (Signed)
Ch visited with Pt as per recommendation by Ch. Waters. Pt seated on chair. Ch introduced self and checked in on Pt, Pt requested help in completing Living Will. Ch explained that at the hospital we mainly help complete Health care directives. Pt repeated that he wants to have Living Will completed so that his wife will get everything after his time. Ch let Pt know that she will follow up on request and further information on Monday when more notaries are present. Ch asked if Pt wanted any prayer, and Pt said "I speak with Him directly." Pt was grateful for visit. Ch will follow up on Pt's request on Monday.

## 2020-02-02 NOTE — Progress Notes (Signed)
Physical Therapy Treatment Patient Details Name: James Sanford MRN: [id number] DOB: 01/13/1956 Today's Date: 02/02/2020    History of Present Illness James Sanford is a 64yoM (PMH: ETOH abuse, DM, HLD, PAF, HTN, OSA) who comes to [facility] 10/22 after a fall at home c subsequent Rt knee pain. PTA pt experiencing BRBPR. Imaging reveals Rt femur frature. Underwent EGD 10/24, colonoscopy 10/25. Underwent ORIF of Right distal femur fracture (periprosthetic) c Dr. Joice Lofts on 10/26. Pt is NWB RLE postoperatively.    PT Comments    Pt was sitting in recliner upon arriving. He agrees to 2nd session and is motivated and cooperative throughout. Session specifically focused on stair navigation. Pt has stairs to enter his home with only L rail ascending. He was able to perform 1" step 2 x 5 with maintaining proper NWB. Progressed to 3 inch step 5x with hopping backwards with use of RW. Pt 's fear of falling and limited UE strength, made attempting higher step height unsafe. Pt was brought to rehab gym and demonstrated the correct performance with using RW with hopping up steps backwards. Author also demonstrated correct performance if he were to have R rail ascending into home. Pt reports his son is handy and would be able to install railing on right. Currently pt would benefit from SNF to address safety with stair navigation. If able to have R rail installed and able to perform safely, could progress to home to [facility]. Will continue to work on stair navigation and strengthening per POC.    Follow Up Recommendations  SNF;Supervision for mobility/OOB     Equipment Recommendations  Rolling walker with 5" wheels    Recommendations for Other Services       Precautions / Restrictions Precautions Precautions: Fall Required Braces or Orthoses: Knee Immobilizer - Right Knee Immobilizer - Right: On at all times Restrictions Weight Bearing Restrictions: Yes RLE Weight Bearing: Non weight bearing     Mobility  Bed Mobility               General bed mobility comments: pt was in recliner pre/post session  Transfers Overall transfer level: Needs assistance Equipment used: Rolling walker (2 wheeled) Transfers: Sit to/from Stand Sit to Stand: Supervision            Ambulation/Gait Ambulation/Gait assistance: Min guard   Assistive device: Rolling walker (2 wheeled) Gait Pattern/deviations:  ("hop to.")     General Gait Details: pt fatigues quickly with gait but is able to adhere to NWB.    Stairs Stairs: Yes Stairs assistance: Min guard;Min assist Stair Management: No rails;Backwards;Step to pattern;With walker   General stair comments: pt was able to perform hopping of one inch step then 3 inch 2 x 5 each. therapist then had pt get into w/c and brought to rehab gym to demonstrate what he needs to be able to perform for safe entry/exit of home. he states understanding. currently only has L rail ascending into home.   Wheelchair Mobility    Modified Rankin (Stroke Patients Only)       Balance Overall balance assessment: History of Falls;Needs assistance Sitting-balance support: Feet supported;No upper extremity supported Sitting balance-Leahy Scale: Good     Standing balance support: Bilateral upper extremity supported;During functional activity Standing balance-Leahy Scale: Fair Standing balance comment: reliant on BUE support to maintain balance and proper wt bearing. once fatigues balance is impacted  Cognition Arousal/Alertness: Awake/alert Behavior During Therapy: WFL for tasks assessed/performed Overall Cognitive Status: Within Functional Limits for tasks assessed                                 General Comments: Pt A and O x 4. motivated to improve      Exercises      General Comments        Pertinent Vitals/Pain Pain Assessment: No/denies pain    Home Living                       Prior Function            PT Goals (current goals can now be found in the care plan section) Acute Rehab PT Goals Patient Stated Goal: get better so I can go home Progress towards PT goals: Progressing toward goals    Frequency    BID      PT Plan Current plan remains appropriate    Co-evaluation              AM-PAC PT "6 Clicks" Mobility   Outcome Measure  Help needed turning from your back to your side while in a flat bed without using bedrails?: None Help needed moving from lying on your back to sitting on the side of a flat bed without using bedrails?: None Help needed moving to and from a bed to a chair (including a wheelchair)?: A Little Help needed standing up from a chair using your arms (e.g., wheelchair or bedside chair)?: A Little Help needed to walk in hospital room?: A Little Help needed climbing 3-5 steps with a railing? : A Lot 6 Click Score: 19    End of Session Equipment Utilized During Treatment: Right knee immobilizer;Gait belt Activity Tolerance: Patient tolerated treatment well;Patient limited by fatigue Patient left: in chair;with chair alarm set;with call bell/phone within reach Nurse Communication: Weight bearing status;Mobility status PT Visit Diagnosis: Difficulty in walking, not elsewhere classified (R26.2);Muscle weakness (generalized) (M62.81);Dizziness and giddiness (R42)     Time: 2683-4196 PT Time Calculation (min) (ACUTE ONLY): 25 min  Charges:  $Gait Training: 23-37 mins                     Jetta Lout PTA 02/02/20, 3:11 PM

## 2020-02-02 NOTE — Progress Notes (Signed)
[facility] Progress Note  Patient: James Sanford    UUV:[id number]  DOA: 01/25/2020     Date of Service: the patient was seen and examined on 02/02/2020  Brief hospital course: Past medical history of HTN, A. fib, OSA, HLD, type II DM, alcohol abuse.  Presents with GI bleed history. SP EGD.    Assessment and Plan: 1.  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 Upper GI endoscopy negative for any acute bleeding. Currently on Protonix p.o. Octreotide was initiated but discontinued now. Colonoscopy was performed twice.  Prep was poor twice.  No acute bleeding reported.  No acute intervention was performed other than biopsy of the polyp. Continue Eliquis Monitor H&H and transfuse hemoglobin less than 7.  2.  Alcohol abuse No evidence of withdrawal for now. Continue CIWA protocol for now.  3.  Type 2 diabetes mellitus, controlled. Continue sliding scale insulin for now.  4.  Paroxysmal A. fib On Eliquis. Rate currently controlled.  5.  Closed minimally displaced periprosthetic right supracondylar femur fracture X-ray actually shows periprosthetic fracture. Status post ORIF by Ortho on 10/26  6.  Thrombocytopenia, chronic, improving Mild worsening right now likely in the setting of PRBC transfusion. In the setting of alcohol abuse. Monitor for now.  Supportive measures  7.  Emphysema Emphysematous changes Seen on CT scan. Likely patient has COPD.  Outpatient work-up recommended.  8.  Depression. Concern for suicidal ideation. Psychiatry input appreciated. Patient does not have suicidal ideation right now. Does not meet inpatient psychiatric criteria. Discontinue sitter.  9.  Hypotension. Resolved now  10.  Leukocytosis. Likely stress reaction. Monitor.  Patient does not have any acute complaints.  11.  Bowel incontinence -chronic and intermittent in nature Reported by family yesterday I recommend outpatient evaluation by neurology.  He could have  diabetic neuropathy and some autonomic disturbances  Diet: Regular diet.  DVT Prophylaxis:   Place TED hose Start: 01/29/20 1724 Foot Pump / plexipulse Start: 01/29/20 1724    Advance goals of care discussion: Full code  Family Communication: Updated patient's wife [name] over phone @ [phone] on 02/01/2020  Disposition:  Status is: Inpatient  Remains inpatient appropriate because:Unsafe d/c plan   Dispo: The patient is from: Home              Anticipated d/c is to: SNF              Anticipated d/c date is: 1 day              Patient currently is medically stable to d/c. Still waiting on insurance authorization for [facility]  Subjective: No new complaints.  Sitting in the chair  Physical Exam:  General: Appear in mild distress, no Rash; Oral Mucosa Clear, moist. no Abnormal Neck Mass Or lumps, Conjunctiva normal  Cardiovascular: S1 and S2 Present, no Murmur, Respiratory: good respiratory effort, Bilateral Air entry present and CTA, no Crackles, no wheezes Abdomen: Bowel Sound present, Soft and no tenderness Extremities: no Pedal edema, blood tinged drainage  Neurology: alert and oriented to time, place, and person affect appropriate. no new focal deficit Gait not checked due to patient safety concerns   Vitals:   01/31/20 2341 02/01/20 0739 02/01/20 1529 02/02/20 0849  BP: 140/89 123/86 132/84 (!) 127/94  Pulse: 79 79 78 87  Resp: 16 17 17 15   Temp: 99.2 F (37.3 C) 98.8 F (37.1 C) 98.6 F (37 C) 98.2 F (36.8 C)  TempSrc: Oral Oral Oral Oral  SpO2: 100%  97% 99% 99%  Weight:      Height:        Intake/Output Summary (Last 24 hours) at 02/02/2020 1221 Last data filed at 02/02/2020 1023 Gross per 24 hour  Intake 120 ml  Output 625 ml  Net -505 ml   Filed Weights   01/25/20 1526 01/27/20 1034  Weight: 81.6 kg 85.3 kg    Data Reviewed: I have personally reviewed and interpreted daily labs, tele strips, imagings as discussed above. I reviewed all  nursing notes, pharmacy notes, vitals, pertinent old records I have discussed plan of care as described above with RN and patient/family.  CBC: Recent Labs  Lab 01/27/20 0901 01/27/20 1254 01/27/20 1929 01/28/20 0313 01/29/20 1118 01/29/20 1754 01/30/20 0419 01/31/20 0635 02/02/20 0756  WBC 4.9   < > 8.3   < > 17.2* 14.1* 15.3* 7.0 5.4  NEUTROABS 3.8  --  6.4  --   --   --  13.9*  --   --   HGB 7.7*   < > 8.0*   < > 8.4* 7.9* 7.9* 7.9* 8.3*  HCT 23.1*   < > 24.4*   < > 25.2* 24.7* 24.5* 24.1* 25.2*  MCV 102.2*   < > 104.7*   < > 104.1* 106.0* 106.1* 105.2* 102.4*  PLT 85*   < > 104*   < > 96* 92* 95* 101* 173   < > = values in this interval not displayed.   Basic Metabolic Panel: Recent Labs  Lab 01/27/20 0901 01/27/20 0901 01/28/20 0313 01/28/20 0313 01/29/20 1118 01/30/20 0419 01/31/20 0635 02/01/20 0607 02/02/20 0756  NA 139   < > 140   < > 141 139 140 140 141  K 3.7   < > 3.8   < > 4.0 4.6 4.3 3.5 3.5  CL 108   < > 110   < > 108 108 108 108 108  CO2 26   < > 25   < > 25 25 28 24 27   GLUCOSE 138*   < > 101*   < > 123* 156* 86 93 91  BUN 9   < > 7*   < > 16 25* 27* 19 13  CREATININE 0.72   < > 0.70   < > 0.95 1.00 0.71 0.60* 0.53*  CALCIUM 7.5*   < > 7.7*   < > 7.5* 7.5* 7.2* 7.3* 7.5*  MG 2.5*  --  2.2  --   --  2.2  --   --   --    < > = values in this interval not displayed.    Studies: No results found.  Scheduled Meds: . apixaban  5 mg Oral BID  . B-complex with vitamin C  1 tablet Oral Daily  . docusate sodium  100 mg Oral BID  . folic acid  1 mg Oral Daily  . lidocaine  1 patch Transdermal Q24H  . multivitamin with minerals  1 tablet Oral Daily  . omega-3 acid ethyl esters  1 g Oral Daily  . pantoprazole  40 mg Oral BID AC  . thiamine  100 mg Oral Daily   Or  . thiamine  100 mg Intravenous Daily  . vitamin B-12  1,000 mcg Oral Daily   Continuous Infusions: . lactated ringers Stopped (02/01/20 1030)   PRN Meds: acetaminophen, bisacodyl,  diphenhydrAMINE, magnesium hydroxide, metoCLOPramide **OR** metoCLOPramide (REGLAN) injection, ondansetron **OR** ondansetron (ZOFRAN) IV, oxyCODONE, sodium phosphate, traMADol  Time spent: 35 minutes  Author: Lynden Oxford, MD [facility] 02/02/2020 12:21 PM  To reach On-call, see care teams to locate the attending and reach out via [email or web address]. Between 7PM-7AM, please contact night-coverage If you still have difficulty reaching the attending provider, please page the [facility] (Director on Call) for [facility] on amion for assistance.

## 2020-02-03 DIAGNOSIS — I428 Other cardiomyopathies: Secondary | ICD-10-CM | POA: Diagnosis not present

## 2020-02-03 DIAGNOSIS — F32A Depression, unspecified: Secondary | ICD-10-CM | POA: Diagnosis not present

## 2020-02-03 DIAGNOSIS — K922 Gastrointestinal hemorrhage, unspecified: Secondary | ICD-10-CM | POA: Diagnosis not present

## 2020-02-03 DIAGNOSIS — F1010 Alcohol abuse, uncomplicated: Secondary | ICD-10-CM | POA: Diagnosis not present

## 2020-02-03 NOTE — Progress Notes (Addendum)
Physical Therapy Treatment Patient Details Name: James Sanford MRN: [id number] DOB: 06/25/1955 Today's Date: 02/03/2020    History of Present Illness James Sanford is a 64yoM (PMH: ETOH abuse, DM, HLD, PAF, HTN, OSA) who comes to [facility] 10/22 after a fall at home c subsequent Rt knee pain. PTA pt experiencing BRBPR. Imaging reveals Rt femur frature. Underwent EGD 10/24, colonoscopy 10/25. Underwent ORIF of Right distal femur fracture (periprosthetic) c Dr. Joice Lofts on 10/26. Pt is NWB RLE postoperatively.    PT Comments    Able to maintain NWB well.  Stands and walks 80' x 2 with hop to gait pattern and generally steady gait.  As he fatigues he needs cues to stand and rest as gait quality decreases and speed increases with fatigue. Brace unlocked to allow for R knee AROM after gait and re-locked after ex.  Pt voices continued concern over stairs.  States son is building a rail but it will not be done for several days.   Discussed portable ramp options as a bridge until rail and strength improved. Pt does like this option and will look into it tomorrow.  Is stairs remain a barrier SNF will be necessary,  Addendum 13:05 - called to room by LPN as family wanted to discuss discharge plan.  Wife upset regarding "change of discharge plan".  Explained to pt/wife that no plan has changed and it remains as discussed prior.  Pt has told wife that we recommended a rolling office chair for mobility which is untrue.  Pt has told son not to build a rail for stairs and wife confirms that rail is not being built at this time.  She strongly wants SNF for rehab prior to discharge and pt agrees but begins crying.  Emotional support given to pt as he seems to want to go home but wife appears to be overwhelmed with providing the care at this time.  She stated several times that she cannot lift him.  I explained his mobility and progress to date and that I did not have to lift him during session and gait was steady  for short distances.  Explained to wife that pt is cognitively intact and when he told me the information earlier in the day and given his performance that home was an option with modifications as appropriate based on the information that pt had given me this morning.  She remains firm in wanting SNF upon discharge.  Pt agrees.     Follow Up Recommendations  SNF;Supervision for mobility/OOB     Equipment Recommendations  Rolling walker with 5" wheels;Other (comment)    Recommendations for Other Services       Precautions / Restrictions Precautions Precautions: Fall Required Braces or Orthoses: Knee Immobilizer - Right Knee Immobilizer - Right: On at all times Restrictions Weight Bearing Restrictions: Yes RLE Weight Bearing: Non weight bearing    Mobility  Bed Mobility               General bed mobility comments: pt was in recliner pre/post session  Transfers Overall transfer level: Needs assistance Equipment used: Rolling walker (2 wheeled) Transfers: Sit to/from Stand Sit to Stand: Supervision Stand pivot transfers: Modified independent (Device/Increase time)          Ambulation/Gait Ambulation/Gait assistance: Min guard Gait Distance (Feet): 80 Feet Assistive device: Rolling walker (2 wheeled) Gait Pattern/deviations: Step-to pattern Gait velocity: decreased   General Gait Details: 16' x 2 with long rest encouraged.  maintains NWB well.  Stairs             Wheelchair Mobility    Modified Rankin (Stroke Patients Only)       Balance Overall balance assessment: History of Falls;Needs assistance Sitting-balance support: Feet supported;No upper extremity supported Sitting balance-Leahy Scale: Good     Standing balance support: Bilateral upper extremity supported;During functional activity Standing balance-Leahy Scale: Fair Standing balance comment: reliant on BUE support to maintain balance and proper wt bearing. once fatigues safety  decreases                            Cognition Arousal/Alertness: Awake/alert Behavior During Therapy: WFL for tasks assessed/performed Overall Cognitive Status: Within Functional Limits for tasks assessed                                        Exercises      General Comments        Pertinent Vitals/Pain Pain Assessment: No/denies pain    Home Living                      Prior Function            PT Goals (current goals can now be found in the care plan section) Progress towards PT goals: Progressing toward goals    Frequency    BID      PT Plan Current plan remains appropriate    Co-evaluation              AM-PAC PT "6 Clicks" Mobility   Outcome Measure  Help needed turning from your back to your side while in a flat bed without using bedrails?: None Help needed moving from lying on your back to sitting on the side of a flat bed without using bedrails?: None Help needed moving to and from a bed to a chair (including a wheelchair)?: A Little Help needed standing up from a chair using your arms (e.g., wheelchair or bedside chair)?: A Little Help needed to walk in hospital room?: A Little Help needed climbing 3-5 steps with a railing? : A Lot 6 Click Score: 19    End of Session Equipment Utilized During Treatment: Right knee immobilizer;Gait belt Activity Tolerance: Patient tolerated treatment well;Patient limited by fatigue Patient left: in chair;with chair alarm set;with call bell/phone within reach Nurse Communication: Weight bearing status;Mobility status       Time: 7829-5621 PT Time Calculation (min) (ACUTE ONLY): 18 min  Charges:  $Gait Training: 8-22 mins                    Danielle Dess, PTA 02/03/20, 9:55 AM

## 2020-02-03 NOTE — Plan of Care (Signed)
  Problem: Health Behavior/Discharge Planning: Goal: Ability to manage health-related needs will improve Outcome: Progressing   Problem: Clinical Measurements: Goal: Ability to maintain clinical measurements within normal limits will improve Outcome: Progressing Goal: Will remain free from infection Outcome: Progressing   

## 2020-02-03 NOTE — Progress Notes (Signed)
[facility] Progress Note  Patient: James Sanford    EUM:[id number]  DOA: 01/25/2020     Date of Service: the patient was seen and examined on 02/03/2020  Brief hospital course: Past medical history of HTN, A. fib, OSA, HLD, type II DM, alcohol abuse.  Presents with GI bleed history. SP EGD.    Assessment and Plan: 1.  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 Upper GI endoscopy negative for any acute bleeding. Continue Protonix p.o. Colonoscopy was performed twice.  Prep was poor twice.  No acute bleeding reported.  No acute intervention was performed other than biopsy of the polyp. Continue Eliquis Monitor H&H and transfuse hemoglobin less than 7.  2.  Alcohol abuse No evidence of withdrawal for now.  3.  Type 2 diabetes mellitus, controlled. Continue sliding scale insulin for now.  4.  Paroxysmal A. fib On Eliquis. Rate currently controlled.  5.  Closed minimally displaced periprosthetic right supracondylar femur fracture X-ray actually shows periprosthetic fracture. Status post ORIF by Ortho on 10/26  6.  Thrombocytopenia, chronic, improving Mild worsening initially In the setting of alcohol abuse. Monitor for now.  Supportive measures  7.  Emphysema Emphysematous changes Seen on CT scan. Likely patient has COPD.  Outpatient work-up recommended.  8.  Depression. Concern for suicidal ideation. Psychiatry input appreciated. Patient does not have suicidal ideation right now. Does not meet inpatient psychiatric criteria. Discontinue sitter.  9.  Hypotension. Resolved now  10.  Leukocytosis. Likely stress reaction. Monitor.  Patient does not have any acute complaints.  11.  Bowel incontinence -chronic and intermittent in nature Reported by family  I recommend outpatient evaluation by neurology.  He could have diabetic neuropathy and some autonomic disturbances  Diet: Regular diet.  DVT Prophylaxis:   Place TED hose Start: 01/29/20 1724 Foot  Pump / plexipulse Start: 01/29/20 1724    Advance goals of care discussion: Full code  Family Communication: Updated patient's wife James Sanford over phone @ [phone] on 02/01/2020  Disposition:  Status is: Inpatient  Remains inpatient appropriate because:Unsafe d/c plan   Dispo: The patient is from: Home              Anticipated d/c is to: SNF              Anticipated d/c date is: 1 day              Patient currently is medically stable to d/c. Still waiting on insurance authorization for [facility]  Subjective: No new complaints.  Sitting in the chair  Physical Exam:  General: Appear in mild distress, no Rash; Oral Mucosa Clear, moist. no Abnormal Neck Mass Or lumps, Conjunctiva normal  Cardiovascular: S1 and S2 Present, no Murmur, Respiratory: good respiratory effort, Bilateral Air entry present and CTA, no Crackles, no wheezes Abdomen: Bowel Sound present, Soft and no tenderness Extremities: no Pedal edema, blood tinged drainage  Neurology: alert and oriented to time, place, and person affect appropriate. no new focal deficit Gait not checked due to patient safety concerns   Vitals:   02/02/20 0849 02/02/20 1539 02/02/20 2323 02/03/20 0808  BP: (!) 127/94 (!) 129/96 (!) 143/94 (!) 135/91  Pulse: 87 76 78 78  Resp: 15 16 16 16   Temp: 98.2 F (36.8 C) 98.6 F (37 C) 98.4 F (36.9 C) 98.4 F (36.9 C)  TempSrc: Oral Oral Oral Oral  SpO2: 99% 99% 99% 100%  Weight:      Height:  Intake/Output Summary (Last 24 hours) at 02/03/2020 1148 Last data filed at 02/03/2020 0552 Gross per 24 hour  Intake 240 ml  Output 0 ml  Net 240 ml   Filed Weights   01/25/20 1526 01/27/20 1034  Weight: 81.6 kg 85.3 kg    Data Reviewed: I have personally reviewed and interpreted daily labs, tele strips, imagings as discussed above. I reviewed all nursing notes, pharmacy notes, vitals, pertinent old records I have discussed plan of care as described above with RN and  patient/family.  CBC: Recent Labs  Lab 01/27/20 1929 01/28/20 0313 01/29/20 1118 01/29/20 1754 01/30/20 0419 01/31/20 0635 02/02/20 0756  WBC 8.3   < > 17.2* 14.1* 15.3* 7.0 5.4  NEUTROABS 6.4  --   --   --  13.9*  --   --   HGB 8.0*   < > 8.4* 7.9* 7.9* 7.9* 8.3*  HCT 24.4*   < > 25.2* 24.7* 24.5* 24.1* 25.2*  MCV 104.7*   < > 104.1* 106.0* 106.1* 105.2* 102.4*  PLT 104*   < > 96* 92* 95* 101* 173   < > = values in this interval not displayed.   Basic Metabolic Panel: Recent Labs  Lab 01/28/20 0313 01/28/20 0313 01/29/20 1118 01/30/20 0419 01/31/20 0635 02/01/20 0607 02/02/20 0756  NA 140   < > 141 139 140 140 141  K 3.8   < > 4.0 4.6 4.3 3.5 3.5  CL 110   < > 108 108 108 108 108  CO2 25   < > 25 25 28 24 27   GLUCOSE 101*   < > 123* 156* 86 93 91  BUN 7*   < > 16 25* 27* 19 13  CREATININE 0.70   < > 0.95 1.00 0.71 0.60* 0.53*  CALCIUM 7.7*   < > 7.5* 7.5* 7.2* 7.3* 7.5*  MG 2.2  --   --  2.2  --   --   --    < > = values in this interval not displayed.    Studies: No results found.  Scheduled Meds: . apixaban  5 mg Oral BID  . B-complex with vitamin C  1 tablet Oral Daily  . docusate sodium  100 mg Oral BID  . folic acid  1 mg Oral Daily  . lidocaine  1 patch Transdermal Q24H  . multivitamin with minerals  1 tablet Oral Daily  . omega-3 acid ethyl esters  1 g Oral Daily  . pantoprazole  40 mg Oral BID AC  . thiamine  100 mg Oral Daily   Or  . thiamine  100 mg Intravenous Daily  . vitamin B-12  1,000 mcg Oral Daily   Continuous Infusions: . lactated ringers Stopped (02/01/20 1030)   PRN Meds: acetaminophen, bisacodyl, diphenhydrAMINE, magnesium hydroxide, metoCLOPramide **OR** metoCLOPramide (REGLAN) injection, ondansetron **OR** ondansetron (ZOFRAN) IV, oxyCODONE, sodium phosphate, traMADol  Time spent: 35 minutes  Author: 02/03/20, MD [facility] 02/03/2020 11:48 AM  To reach On-call, see care teams to locate the attending and reach  out via [email or web address]. Between 7PM-7AM, please contact night-coverage If you still have difficulty reaching the attending provider, please page the [facility] (Director on Call) for [facility] on amion for assistance.

## 2020-02-04 DIAGNOSIS — F1010 Alcohol abuse, uncomplicated: Secondary | ICD-10-CM | POA: Diagnosis not present

## 2020-02-04 DIAGNOSIS — F32A Depression, unspecified: Secondary | ICD-10-CM | POA: Diagnosis not present

## 2020-02-04 DIAGNOSIS — K922 Gastrointestinal hemorrhage, unspecified: Secondary | ICD-10-CM | POA: Diagnosis not present

## 2020-02-04 DIAGNOSIS — I428 Other cardiomyopathies: Secondary | ICD-10-CM | POA: Diagnosis not present

## 2020-02-04 NOTE — Progress Notes (Signed)
Subjective: 6 Days Post-Op Procedure(s) (LRB): OPEN REDUCTION INTERNAL FIXATION (ORIF) DISTAL FEMUR FRACTURE (Right) Patient reports pain as mild.   Patient is well, and has had no acute complaints or problems Current plan is for d/c to SNF today. Patient has had a BM. Negative for chest pain and shortness of breath Fever: no Gastrointestinal:Negative for nausea and vomiting  Objective: Vital signs in last 24 hours: Temp:  [98 F (36.7 C)-98.6 F (37 C)] 98.6 F (37 C) (10/31 2259) Pulse Rate:  [62-110] 65 (10/31 2259) Resp:  [16] 16 (10/31 2259) BP: (126-138)/(87-98) 126/88 (10/31 2259) SpO2:  [73 %-100 %] 100 % (10/31 2259)  Intake/Output from previous day:  Intake/Output Summary (Last 24 hours) at 02/04/2020 0755 Last data filed at 02/03/2020 2110 Gross per 24 hour  Intake 1897.52 ml  Output --  Net 1897.52 ml    Intake/Output this shift: No intake/output data recorded.  Labs: Recent Labs    02/02/20 0756  HGB 8.3*   Recent Labs    02/02/20 0756  WBC 5.4  RBC 2.46*  HCT 25.2*  PLT 173   Recent Labs    02/02/20 0756  NA 141  K 3.5  CL 108  CO2 27  BUN 13  CREATININE 0.53*  GLUCOSE 91  CALCIUM 7.5*   No results for input(s): LABPT, INR in the last 72 hours.  EXAM General - Patient is Alert, Appropriate and Oriented Extremity - ABD soft Sensation intact distally Intact pulses distally Dorsiflexion/Plantar flexion intact Incision: scant drainage  Right leg compartments soft to palpation. Negative homans bilaterally. Dressing/Incision - blood tinged drainage Motor Function - intact, moving foot and toes well on exam.  Able to perform a straight leg raise with assistance, QUAD strength improved from last week.  Past Medical History:  Diagnosis Date  . Arthritis   . Diabetes (HCC)   . Hyperlipemia   . Hypertension   . Sleep apnea    Assessment/Plan: 6 Days Post-Op Procedure(s) (LRB): OPEN REDUCTION INTERNAL FIXATION (ORIF) DISTAL FEMUR  FRACTURE (Right) Principal Problem:   Alcohol abuse Active Problems:   Acute GI bleeding   Diabetes mellitus type 2, uncomplicated (HCC)   Cardiomyopathy, idiopathic (HCC)   Hyperlipidemia   Paroxysmal A-fib (HCC)   Obesity   Hypertension   Conceited it was a great history will call admit  Estimated body mass index is 25.5 kg/m as calculated from the following:   Height as of this encounter: 6' (1.829 m).   Weight as of this encounter: 85.3 kg. Advance diet Up with therapy D/C IV fluids when tolerating po intake.  VSS Patient has had a BM since surgery. Current plan is for d/c to SNF, plan for d/c today. Keep brace locked in extension when ambulating. Can unlock brace when not moving around and work on some gentle flexion of the knee.  Will continue on Eliquis following discharge for DVT prevention. Follow-up with [facility] in one week for staple removal.  DVT Prophylaxis - TED hose and Eliquis Non-weightbearing to right leg  J. Horris Latino, PA-C [facility] 02/04/2020, 7:55 AM

## 2020-02-04 NOTE — Progress Notes (Signed)
Physical Therapy Treatment Patient Details Name: James Sanford MRN: [id number] DOB: 07-23-1955 Today's Date: 02/04/2020    History of Present Illness James Sanford is a 64yoM (PMH: ETOH abuse, DM, HLD, PAF, HTN, OSA) who comes to [facility] 10/22 after a fall at home c subsequent Rt knee pain. PTA pt experiencing BRBPR. Imaging reveals Rt femur frature. Underwent EGD 10/24, colonoscopy 10/25. Underwent ORIF of Right distal femur fracture (periprosthetic) c Dr. Joice Lofts on 10/26. Pt is NWB RLE postoperatively.    PT Comments    Pt ready for session.  Needing to have a BM.  To EOB with ease and no assist.  Stood and transferred to commode with RW and min guard.  Large soft BM and voided with ind self care.  He stood and transferred to commode.  Session focused on standing tolerance and exercises.  He is able to stand for extended periods of time with walker and min guard/supervision with no LOB noted.  Brace unlocked for AROM to R knee and re-locked after session.    Pt seems to have some cognitive deficits and unsure if these are baseline or not.  He stated that he will be coming back here for therapy once he discharges to [facility].  Explained PT services were given there.  He stated MD told him he would be coming here daily.  Explained that he would be seeing MD for appointments in the office but therapy would happen there.  "Welll you think the doctor would know that."    Pt does make some other odd statements which may explain some miscommunication with wife and interaction yesterday with her.     Follow Up Recommendations  SNF;Supervision for mobility/OOB     Equipment Recommendations  Rolling walker with 5" wheels;Other (comment)    Recommendations for Other Services       Precautions / Restrictions Precautions Precautions: Fall Required Braces or Orthoses: Knee Immobilizer - Right Knee Immobilizer - Right: On at all times    Mobility  Bed Mobility Overal bed mobility: Modified  Independent                Transfers Overall transfer level: Needs assistance Equipment used: Rolling walker (2 wheeled) Transfers: Sit to/from Stand Sit to Stand: Supervision            Ambulation/Gait Ambulation/Gait assistance: Min guard Gait Distance (Feet): 3 Feet Assistive device: Rolling walker (2 wheeled)   Gait velocity: decreased   General Gait Details: hop to pattern   Stairs             Wheelchair Mobility    Modified Rankin (Stroke Patients Only)       Balance Overall balance assessment: History of Falls;Needs assistance Sitting-balance support: Feet supported;No upper extremity supported Sitting balance-Leahy Scale: Good     Standing balance support: Bilateral upper extremity supported;During functional activity Standing balance-Leahy Scale: Fair Standing balance comment: reliant on BUE support to maintain balance and proper wt bearing. once fatigues safety decreases                            Cognition Arousal/Alertness: Awake/alert Behavior During Therapy: WFL for tasks assessed/performed Overall Cognitive Status: Difficult to assess                                 General Comments: Pt seems to have some cognitive deficits which may be leading  to some confusion and miscommunication with wife.      Exercises Other Exercises Other Exercises: standing SLR, ab/add and hip ext 3 x 20 with walker support,  brace unlocked for AROM R knee    General Comments        Pertinent Vitals/Pain Pain Assessment: No/denies pain    Home Living                      Prior Function            PT Goals (current goals can now be found in the care plan section) Progress towards PT goals: Progressing toward goals    Frequency    BID      PT Plan Current plan remains appropriate    Co-evaluation              AM-PAC PT "6 Clicks" Mobility   Outcome Measure  Help needed turning from your  back to your side while in a flat bed without using bedrails?: None Help needed moving from lying on your back to sitting on the side of a flat bed without using bedrails?: None Help needed moving to and from a bed to a chair (including a wheelchair)?: A Little Help needed standing up from a chair using your arms (e.g., wheelchair or bedside chair)?: A Little Help needed to walk in hospital room?: A Little Help needed climbing 3-5 steps with a railing? : A Lot 6 Click Score: 19    End of Session Equipment Utilized During Treatment: Right knee immobilizer;Gait belt Activity Tolerance: Patient tolerated treatment well Patient left: in chair;with chair alarm set;with call bell/phone within reach Nurse Communication: Weight bearing status;Mobility status       Time: 6440-3474 PT Time Calculation (min) (ACUTE ONLY): 24 min  Charges:  $Therapeutic Exercise: 8-22 mins $Therapeutic Activity: 8-22 mins                    Danielle Dess, PTA 02/04/20, 10:38 AM

## 2020-02-04 NOTE — Progress Notes (Signed)
Follow up visit from Saturday about possibly completing living will. Pt says he will be discharged tomorrow and will attempt it at rehab.

## 2020-02-04 NOTE — Progress Notes (Signed)
[facility] Progress Note  Patient: James Sanford    XBM:[id number]  DOA: 01/25/2020     Date of Service: the patient was seen and examined on 02/04/2020  Brief hospital course: Past medical history of HTN, A. fib, OSA, HLD, type II DM, alcohol abuse.  Presents with GI bleed history. SP EGD.    Assessment and Plan: 1.  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 Upper GI endoscopy negative for any acute bleeding. Continue Protonix p.o. Colonoscopy was performed twice.  Prep was poor twice.  No acute bleeding reported.  No acute intervention was performed other than biopsy of the polyp. Continue Eliquis Monitor H&H and transfuse hemoglobin less than 7.  2.  Alcohol abuse No evidence of withdrawal for now.  3.  Type 2 diabetes mellitus, controlled. Continue sliding scale insulin for now.  4.  Paroxysmal A. fib On Eliquis. Rate currently controlled.  5.  Closed minimally displaced periprosthetic right supracondylar femur fracture X-ray actually shows periprosthetic fracture. Status post ORIF by Ortho on 10/26  6.  Thrombocytopenia, chronic, improving Mild worsening initially In the setting of alcohol abuse. Monitor for now.  Supportive measures  7.  Emphysema Emphysematous changes Seen on CT scan. Likely patient has COPD.  Outpatient work-up recommended.  8.  Depression. Concern for suicidal ideation. Psychiatry input appreciated. Patient does not have suicidal ideation right now. Does not meet inpatient psychiatric criteria. Discontinue sitter.  9.  Hypotension. Resolved now  10.  Leukocytosis. Likely stress reaction. Monitor.  Patient does not have any acute complaints.  11.  Bowel incontinence -chronic and intermittent in nature Reported by family  I recommend outpatient evaluation by neurology.  He could have diabetic neuropathy and some autonomic disturbances  Diet: Regular diet.  DVT Prophylaxis:   Place TED hose Start: 01/29/20 1724 Foot  Pump / plexipulse Start: 01/29/20 1724    Advance goals of care discussion: Full code  Family Communication: Updated patient's wife [name] over phone @ [phone] on 02/01/2020  Disposition:  Status is: Inpatient  Remains inpatient appropriate because:Unsafe d/c plan   Dispo: The patient is from: Home              Anticipated d/c is to: SNF              Anticipated d/c date is: 1 day              Patient currently is medically stable to d/c. Still waiting on insurance authorization for [facility]  Subjective: No new complaints.  Made him aware that we are waiting on insurance Auth  Physical Exam:  General: Appear in mild distress, no Rash; Oral Mucosa Clear, moist. no Abnormal Neck Mass Or lumps, Conjunctiva normal  Cardiovascular: S1 and S2 Present, no Murmur, Respiratory: good respiratory effort, Bilateral Air entry present and CTA, no Crackles, no wheezes Abdomen: Bowel Sound present, Soft and no tenderness Extremities: no Pedal edema, blood tinged drainage  Neurology: alert and oriented to time, place, and person affect appropriate. no new focal deficit Gait not checked due to patient safety concerns   Vitals:   02/03/20 1527 02/03/20 1533 02/03/20 2259 02/04/20 0826  BP: (!) 138/93 128/87 126/88 124/84  Pulse: (!) 110 85 65 62  Resp: 16 16 16 17   Temp: 98 F (36.7 C) 98 F (36.7 C) 98.6 F (37 C) 97.6 F (36.4 C)  TempSrc: Oral Oral Oral Oral  SpO2: (!) 73% 100% 100% 100%  Weight:      Height:  Intake/Output Summary (Last 24 hours) at 02/04/2020 1352 Last data filed at 02/04/2020 1015 Gross per 24 hour  Intake 2137.52 ml  Output --  Net 2137.52 ml   Filed Weights   01/25/20 1526 01/27/20 1034  Weight: 81.6 kg 85.3 kg    Data Reviewed: I have personally reviewed and interpreted daily labs, tele strips, imagings as discussed above. I reviewed all nursing notes, pharmacy notes, vitals, pertinent old records I have discussed plan of care as  described above with RN and patient/family.  CBC: Recent Labs  Lab 01/29/20 1118 01/29/20 1754 01/30/20 0419 01/31/20 0635 02/02/20 0756  WBC 17.2* 14.1* 15.3* 7.0 5.4  NEUTROABS  --   --  13.9*  --   --   HGB 8.4* 7.9* 7.9* 7.9* 8.3*  HCT 25.2* 24.7* 24.5* 24.1* 25.2*  MCV 104.1* 106.0* 106.1* 105.2* 102.4*  PLT 96* 92* 95* 101* 173   Basic Metabolic Panel: Recent Labs  Lab 01/29/20 1118 01/30/20 0419 01/31/20 0635 02/01/20 0607 02/02/20 0756  NA 141 139 140 140 141  K 4.0 4.6 4.3 3.5 3.5  CL 108 108 108 108 108  CO2 25 25 28 24 27   GLUCOSE 123* 156* 86 93 91  BUN 16 25* 27* 19 13  CREATININE 0.95 1.00 0.71 0.60* 0.53*  CALCIUM 7.5* 7.5* 7.2* 7.3* 7.5*  MG  --  2.2  --   --   --     Studies: No results found.  Scheduled Meds:  apixaban  5 mg Oral BID   B-complex with vitamin C  1 tablet Oral Daily   docusate sodium  100 mg Oral BID   folic acid  1 mg Oral Daily   lidocaine  1 patch Transdermal Q24H   multivitamin with minerals  1 tablet Oral Daily   omega-3 acid ethyl esters  1 g Oral Daily   pantoprazole  40 mg Oral BID AC   thiamine  100 mg Oral Daily   Or   thiamine  100 mg Intravenous Daily   vitamin B-12  1,000 mcg Oral Daily   Continuous Infusions:  lactated ringers Stopped (02/01/20 0951)   PRN Meds: acetaminophen, bisacodyl, diphenhydrAMINE, magnesium hydroxide, metoCLOPramide **OR** metoCLOPramide (REGLAN) injection, ondansetron **OR** ondansetron (ZOFRAN) IV, oxyCODONE, sodium phosphate, traMADol  Time spent: 35 minutes  Author: 02/03/20, MD [facility] 02/04/2020 1:52 PM  To reach On-call, see care teams to locate the attending and reach out via [email or web address]. Between 7PM-7AM, please contact night-coverage If you still have difficulty reaching the attending provider, please page the [facility] (Director on Call) for [facility] on amion for assistance.

## 2020-02-04 NOTE — Progress Notes (Signed)
OT Cancellation Note  Patient Details Name: James Sanford MRN: [id number] DOB: 05-Nov-1955   Cancelled Treatment:    Reason Eval/Treat Not Completed: Patient declined, no reason specified. Patient stated he did not want to participate; he was "tired."  Latina Craver, PhD, MS, OTR/L ascom [phone] 02/04/20, 4:24 PM

## 2020-02-05 DIAGNOSIS — F1010 Alcohol abuse, uncomplicated: Secondary | ICD-10-CM | POA: Diagnosis not present

## 2020-02-05 DIAGNOSIS — K922 Gastrointestinal hemorrhage, unspecified: Secondary | ICD-10-CM | POA: Diagnosis not present

## 2020-02-05 DIAGNOSIS — F32A Depression, unspecified: Secondary | ICD-10-CM | POA: Diagnosis not present

## 2020-02-05 DIAGNOSIS — I428 Other cardiomyopathies: Secondary | ICD-10-CM | POA: Diagnosis not present

## 2020-02-05 NOTE — Plan of Care (Signed)
No acute events. Pt anxious to leave today he says.   Problem: Health Behavior/Discharge Planning: Goal: Ability to manage health-related needs will improve Outcome: Progressing   Problem: Clinical Measurements: Goal: Ability to maintain clinical measurements within normal limits will improve Outcome: Progressing Goal: Will remain free from infection Outcome: Progressing

## 2020-02-05 NOTE — Progress Notes (Signed)
[facility] Progress Note  Patient: James Sanford    ZSW:[id number]  DOA: 01/25/2020     Date of Service: the patient was seen and examined on 02/05/2020  Brief hospital course: Past medical history of HTN, A. fib, OSA, HLD, type II DM, alcohol abuse.  Presents with GI bleed history. SP EGD.    Assessment and Plan: 1.  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 Upper GI endoscopy negative for any acute bleeding. Continue Protonix p.o. Colonoscopy was performed twice.  Prep was poor twice.  No acute bleeding reported.  No acute intervention was performed other than biopsy of the polyp. Continue Eliquis Monitor H&H and transfuse hemoglobin less than 7.  2.  Alcohol abuse No evidence of withdrawal for now.  3.  Type 2 diabetes mellitus, controlled. Continue sliding scale insulin for now.  4.  Paroxysmal A. fib On Eliquis. Rate currently controlled.  5.  Closed minimally displaced periprosthetic right supracondylar femur fracture X-ray actually shows periprosthetic fracture. Status post ORIF by Ortho on 10/26  6.  Thrombocytopenia, chronic, improving Mild worsening initially In the setting of alcohol abuse. Monitor for now.  Supportive measures  7.  Emphysema Emphysematous changes Seen on CT scan. Likely patient has COPD.  Outpatient work-up recommended.  8.  Depression. Concern for suicidal ideation. Psychiatry input appreciated. Patient does not have suicidal ideation right now. Does not meet inpatient psychiatric criteria. Discontinue sitter.  9.  Hypotension. Resolved now  10.  Leukocytosis. Likely stress reaction. Monitor.  Patient does not have any acute complaints.  11.  Bowel incontinence -chronic and intermittent in nature Reported by family  I recommend outpatient evaluation by neurology.  He could have diabetic neuropathy and some autonomic disturbances  Diet: Regular diet.  DVT Prophylaxis:   Place TED hose Start: 01/29/20 1724 Foot  Pump / plexipulse Start: 01/29/20 1724    Advance goals of care discussion: Full code  Family Communication: Updated patient's wife James Sanford over phone @ [phone] on 02/05/2020  Disposition:  Status is: Inpatient  Remains inpatient appropriate because:Unsafe d/c plan   Dispo: The patient is from: Home              Anticipated d/c is to: SNF              Anticipated d/c date is: 1 day              Patient currently is medically stable to d/c. Still waiting on insurance authorization for [facility]  Subjective: No new complaints.  Made him aware that we are still waiting on insurance Auth  Physical Exam:  General: Appear in mild distress, no Rash; Oral Mucosa Clear, moist. no Abnormal Neck Mass Or lumps, Conjunctiva normal  Cardiovascular: S1 and S2 Present, no Murmur, Respiratory: good respiratory effort, Bilateral Air entry present and CTA, no Crackles, no wheezes Abdomen: Bowel Sound present, Soft and no tenderness Extremities: no Pedal edema, blood tinged drainage  Neurology: alert and oriented to time, place, and person affect appropriate. no new focal deficit Gait not checked due to patient safety concerns   Vitals:   02/04/20 1536 02/04/20 2357 02/05/20 0733 02/05/20 1201  BP: 122/86 120/90 118/84 118/82  Pulse: 68 68 63 81  Resp: 16 17 16 17   Temp: 98.1 F (36.7 C) 98.2 F (36.8 C) 98.1 F (36.7 C) 98.5 F (36.9 C)  TempSrc: Oral  Oral Oral  SpO2: 100% 95% 100% 100%  Weight:      Height:  Intake/Output Summary (Last 24 hours) at 02/05/2020 1418 Last data filed at 02/05/2020 1030 Gross per 24 hour  Intake 720 ml  Output 800 ml  Net -80 ml   Filed Weights   01/25/20 1526 01/27/20 1034  Weight: 81.6 kg 85.3 kg    Data Reviewed: I have personally reviewed and interpreted daily labs, tele strips, imagings as discussed above. I reviewed all nursing notes, pharmacy notes, vitals, pertinent old records I have discussed plan of care as described  above with RN and patient/family.  CBC: Recent Labs  Lab 01/29/20 1754 01/30/20 0419 01/31/20 0635 02/02/20 0756  WBC 14.1* 15.3* 7.0 5.4  NEUTROABS  --  13.9*  --   --   HGB 7.9* 7.9* 7.9* 8.3*  HCT 24.7* 24.5* 24.1* 25.2*  MCV 106.0* 106.1* 105.2* 102.4*  PLT 92* 95* 101* 173   Basic Metabolic Panel: Recent Labs  Lab 01/30/20 0419 01/31/20 0635 02/01/20 0607 02/02/20 0756  NA 139 140 140 141  K 4.6 4.3 3.5 3.5  CL 108 108 108 108  CO2 25 28 24 27   GLUCOSE 156* 86 93 91  BUN 25* 27* 19 13  CREATININE 1.00 0.71 0.60* 0.53*  CALCIUM 7.5* 7.2* 7.3* 7.5*  MG 2.2  --   --   --     Studies: No results found.  Scheduled Meds:  apixaban  5 mg Oral BID   B-complex with vitamin C  1 tablet Oral Daily   docusate sodium  100 mg Oral BID   folic acid  1 mg Oral Daily   lidocaine  1 patch Transdermal Q24H   multivitamin with minerals  1 tablet Oral Daily   omega-3 acid ethyl esters  1 g Oral Daily   pantoprazole  40 mg Oral BID AC   thiamine  100 mg Oral Daily   Or   thiamine  100 mg Intravenous Daily   vitamin B-12  1,000 mcg Oral Daily   Continuous Infusions:  lactated ringers Stopped (02/01/20 0951)   PRN Meds: acetaminophen, bisacodyl, diphenhydrAMINE, magnesium hydroxide, metoCLOPramide **OR** metoCLOPramide (REGLAN) injection, ondansetron **OR** ondansetron (ZOFRAN) IV, oxyCODONE, sodium phosphate, traMADol  Time spent: 35 minutes  Author: 02/03/20, MD [facility] 02/05/2020 2:18 PM  To reach On-call, see care teams to locate the attending and reach out via [email or web address]. Between 7PM-7AM, please contact night-coverage If you still have difficulty reaching the attending provider, please page the [facility] (Director on Call) for [facility] on amion for assistance.

## 2020-02-05 NOTE — Progress Notes (Addendum)
Physical Therapy Treatment Patient Details Name: James Sanford MRN: [id number] DOB: 1955/12/04 Today's Date: 02/05/2020    History of Present Illness James Sanford is a 64yoM (PMH: ETOH abuse, DM, HLD, PAF, HTN, OSA) who comes to [facility] 10/22 after a fall at home c subsequent Rt knee pain. PTA pt experiencing BRBPR. Imaging reveals Rt femur frature. Underwent EGD 10/24, colonoscopy 10/25. Underwent ORIF of Right distal femur fracture (periprosthetic) c Dr. Joice Lofts on 10/26. Pt is NWB RLE postoperatively.    PT Comments    Pt seated in recliner chair upon arrival to room. Pt agreeable to PT session and denies pain. Pt performed sit <> stand transfer with SBA/supervision for safety however required no physical assistance. Pt ambulated 100 feet using hop-to technique. Pt fatigued and required standing rest breaks x 2. Pt returned to room and performed standing therex for promotion of functional activity tolerance. Verbal and tactile cues for correct technique and isolation of glute musculature. Pt then seated and performed tricep presses for improved UE strengthening due to increased reliance due to RW usage to maintain RLE NWB status. Pt then transitioned to RLE therex in recliner chair. Pt required min A for SLR against gravity due to muscle weakness. Pt maintained NWB status on RLE throughout session. Pt making progress toward goals however continues to remain appropriate for STR to optimize return to PLOF and maximize functional mobility, safety, and independence.      Follow Up Recommendations  SNF;Supervision for mobility/OOB     Equipment Recommendations  Rolling walker with 5" wheels    Recommendations for Other Services       Precautions / Restrictions Precautions Precautions: Fall Required Braces or Orthoses: Knee Immobilizer - Right Knee Immobilizer - Right: On at all times Restrictions Weight Bearing Restrictions: Yes RLE Weight Bearing: Non weight bearing Other  Position/Activity Restrictions: locked in extension with ambulation    Mobility  Bed Mobility               General bed mobility comments: not tested as pt sitting in recliner chair upon arrival  Transfers Overall transfer level: Needs assistance Equipment used: Rolling walker (2 wheeled) Transfers: Sit to/from Stand Sit to Stand: Supervision         General transfer comment: supervision for safety as pt performed sit <> stands; maintains NWB throughout transfers  Ambulation/Gait Ambulation/Gait assistance: Min guard Gait Distance (Feet): 100 Feet Assistive device: Rolling walker (2 wheeled) Gait Pattern/deviations: Step-to pattern Gait velocity: decreased   General Gait Details: hop-to pattern throughout; CGA for safety; pt required 2 standing rest breaks secondary to fatigue   Stairs             Wheelchair Mobility    Modified Rankin (Stroke Patients Only)       Balance Overall balance assessment: History of Falls;Needs assistance Sitting-balance support: Feet supported;No upper extremity supported Sitting balance-Leahy Scale: Good Sitting balance - Comments: no overt LOB reaching within or outside of BOS   Standing balance support: Bilateral upper extremity supported;During functional activity Standing balance-Leahy Scale: Fair Standing balance comment: reliant on RW for balance with BUE on RW; CGA for safety                            Cognition  Exercises Other Exercises Other Exercises: standing exercises on RLE: hip flexion, hip abduction, and hip extension x 20; verbal cues for correct technique with hip extension to isolate movement Other Exercises: seated therex: bilat tricep presses 10 reps x 2 sets, RLE SLR with min A and RLE hip ab/add x 20    General Comments        Pertinent Vitals/Pain Pain Assessment: No/denies pain    Home Living                       Prior Function            PT Goals (current goals can now be found in the care plan section) Acute Rehab PT Goals Patient Stated Goal: get better so I can go home PT Goal Formulation: With patient Time For Goal Achievement: 02/13/20 Potential to Achieve Goals: Fair Progress towards PT goals: Progressing toward goals    Frequency    BID      PT Plan Current plan remains appropriate    Co-evaluation              AM-PAC PT "6 Clicks" Mobility   Outcome Measure  Help needed turning from your back to your side while in a flat bed without using bedrails?: None Help needed moving from lying on your back to sitting on the side of a flat bed without using bedrails?: None Help needed moving to and from a bed to a chair (including a wheelchair)?: A Little Help needed standing up from a chair using your arms (e.g., wheelchair or bedside chair)?: A Little Help needed to walk in hospital room?: A Little Help needed climbing 3-5 steps with a railing? : A Lot 6 Click Score: 19    End of Session Equipment Utilized During Treatment: Right knee immobilizer;Gait belt Activity Tolerance: Patient tolerated treatment well Patient left: in chair;with call bell/phone within reach;with chair alarm set;with SCD's reapplied Nurse Communication: Weight bearing status;Mobility status PT Visit Diagnosis: Difficulty in walking, not elsewhere classified (R26.2);Muscle weakness (generalized) (M62.81);Dizziness and giddiness (R42)     Time: 6644-0347 PT Time Calculation (min) (ACUTE ONLY): 26 min  Charges:                        Frederich Chick, SPT   Frederich Chick 02/05/2020, 10:48 AM   This note has been reviewed and this clinician agrees with the information provided.   Hendricks Limes, PT 02/05/20, 4:14 PM

## 2020-02-05 NOTE — TOC Progression Note (Addendum)
Transition of Care [facility]) - Progression Note    Patient Details  Name: James Sanford MRN: [id number] Date of Birth: [birth year]  [facility] CM/SW Contact  Liliana Cline, LCSW Phone Number: 02/05/2020, 9:32 AM  Clinical Narrative:   CSW reached out to [place] and Tammy with [facility]. Inquired if they have insurance authorization yet. Waiting to hear back.  1:00- CSW reached out to [employer] again. Waiting to hear back.    Expected Discharge Plan: Home/Self Care Barriers to Discharge: Continued Medical Work up  Expected Discharge Plan and Services Expected Discharge Plan: Home/Self Care In-house Referral: Clinical Social Work     Living arrangements for the past 2 months: Single Family Home                                       Social Determinants of Health (SDOH) Interventions    Readmission Risk Interventions No flowsheet data found.

## 2020-02-05 NOTE — Progress Notes (Signed)
Physical Therapy Treatment Patient Details Name: James Sanford MRN: [id number] DOB: 26-Jul-1955 Today's Date: 02/05/2020    History of Present Illness James Sanford is a 89yoM (PMH: ETOH abuse, DM, HLD, PAF, HTN, OSA) who comes to [facility] 10/22 after a fall at home c subsequent Rt knee pain. PTA pt experiencing BRBPR. Imaging reveals Rt femur frature. Underwent EGD 10/24, colonoscopy 10/25. Underwent ORIF of Right distal femur fracture (periprosthetic) c Dr. Roland Rack on 10/26. Pt is NWB RLE postoperatively.    PT Comments    Pt seated in recliner chair with wife present. Pt agreeable to PT session. Pt continues to perform transfers with supervision for safety and with noted good control with rise and descent to chair. Pt stood to RW and ambulated 75 feet this afternoon, NWB on RLE with CGA for safety, with pt reporting fatigue which limited further ambulation distance. Pt required 3 standing rest breaks. Pt returned to room and performed standing and sitting RLE therex requiring assistance only for SLR due to muscle weakness. Pt maintained NWB status on RLE throughout session. Pt left with all needs met and wife in room.    Follow Up Recommendations  SNF;Supervision for mobility/OOB     Equipment Recommendations  Rolling walker with 5" wheels    Recommendations for Other Services       Precautions / Restrictions Precautions Precautions: Fall Required Braces or Orthoses: Knee Immobilizer - Right Knee Immobilizer - Right: On at all times Restrictions Weight Bearing Restrictions: Yes RLE Weight Bearing: Non weight bearing Other Position/Activity Restrictions: locked in extension with ambulation    Mobility  Bed Mobility               General bed mobility comments: pt in chair upon arrival and at end of session  Transfers Overall transfer level: Needs assistance Equipment used: Rolling walker (2 wheeled) Transfers: Sit to/from Stand Sit to Stand: Supervision          General transfer comment: for safety; pt with good control of movements and able to transition to RW with supervision only  Ambulation/Gait Ambulation/Gait assistance: Min guard Gait Distance (Feet): 75 Feet Assistive device: Rolling walker (2 wheeled) Gait Pattern/deviations: Step-to pattern Gait velocity: decreased   General Gait Details: hop-to pattern throughout; CGA for safety; pt required 3 standing rest breaks due to fatigue   Stairs             Wheelchair Mobility    Modified Rankin (Stroke Patients Only)       Balance Overall balance assessment: History of Falls;Needs assistance Sitting-balance support: Feet supported;No upper extremity supported Sitting balance-Leahy Scale: Good Sitting balance - Comments: no LOB noted   Standing balance support: Bilateral upper extremity supported;During functional activity Standing balance-Leahy Scale: Fair Standing balance comment: reliant on RW for balance with BUE on RW; CGA for safety                            Cognition Arousal/Alertness: Awake/alert Behavior During Therapy: WFL for tasks assessed/performed Overall Cognitive Status: Difficult to assess                                        Exercises Other Exercises Other Exercises: therex in standing: RLE hip flexion, abduction, and extension x 20, 2 sets Other Exercises: seated therex: bilat tricep presses 10 reps x 2 sets, RLE SLR  with min A x 20, RLE hip ab/add x 20    General Comments        Pertinent Vitals/Pain Pain Assessment: No/denies pain    Home Living                      Prior Function            PT Goals (current goals can now be found in the care plan section) Acute Rehab PT Goals Patient Stated Goal: get better so I can go home PT Goal Formulation: With patient Time For Goal Achievement: 02/13/20 Potential to Achieve Goals: Good Progress towards PT goals: Progressing toward goals     Frequency    BID      PT Plan Current plan remains appropriate    Co-evaluation              AM-PAC PT "6 Clicks" Mobility   Outcome Measure  Help needed turning from your back to your side while in a flat bed without using bedrails?: None Help needed moving from lying on your back to sitting on the side of a flat bed without using bedrails?: None Help needed moving to and from a bed to a chair (including a wheelchair)?: A Little Help needed standing up from a chair using your arms (e.g., wheelchair or bedside chair)?: A Little Help needed to walk in hospital room?: A Little Help needed climbing 3-5 steps with a railing? : A Lot 6 Click Score: 19    End of Session Equipment Utilized During Treatment: Right knee immobilizer;Gait belt Activity Tolerance: Patient tolerated treatment well Patient left: in chair;with call bell/phone within reach;with chair alarm set;with family/visitor present Nurse Communication: Weight bearing status;Mobility status PT Visit Diagnosis: Difficulty in walking, not elsewhere classified (R26.2);Muscle weakness (generalized) (M62.81);Dizziness and giddiness (R42)     Time: 8706-5826 PT Time Calculation (min) (ACUTE ONLY): 24 min  Charges:                        Vale Haven, SPT   Vale Haven 02/05/2020, 4:51 PM

## 2020-02-06 DIAGNOSIS — I480 Paroxysmal atrial fibrillation: Secondary | ICD-10-CM | POA: Diagnosis not present

## 2020-02-06 DIAGNOSIS — E119 Type 2 diabetes mellitus without complications: Secondary | ICD-10-CM | POA: Diagnosis not present

## 2020-02-06 DIAGNOSIS — F1010 Alcohol abuse, uncomplicated: Secondary | ICD-10-CM | POA: Diagnosis not present

## 2020-02-06 DIAGNOSIS — E785 Hyperlipidemia, unspecified: Secondary | ICD-10-CM | POA: Diagnosis not present

## 2020-02-06 LAB — SARS CORONAVIRUS 2 BY RT PCR (HOSPITAL ORDER, PERFORMED IN ~~LOC~~ HOSPITAL LAB): SARS Coronavirus 2: NEGATIVE

## 2020-02-06 MED ORDER — METOPROLOL SUCCINATE ER 50 MG PO TB24
25.0000 mg | ORAL_TABLET | Freq: Every day | ORAL | 0 refills | Status: DC
Start: 2020-02-06 — End: 2022-08-03

## 2020-02-06 MED ORDER — CYANOCOBALAMIN 1000 MCG PO TABS
1000.0000 ug | ORAL_TABLET | Freq: Every day | ORAL | 0 refills | Status: DC
Start: 2020-02-07 — End: 2020-11-24

## 2020-02-06 MED ORDER — THIAMINE HCL 100 MG PO TABS
100.0000 mg | ORAL_TABLET | Freq: Every day | ORAL | 0 refills | Status: DC
Start: 2020-02-07 — End: 2020-11-24

## 2020-02-06 MED ORDER — FOLIC ACID 1 MG PO TABS
1.0000 mg | ORAL_TABLET | Freq: Every day | ORAL | 0 refills | Status: DC
Start: 2020-02-07 — End: 2020-11-24

## 2020-02-06 NOTE — Discharge Summary (Signed)
[facility] -  at [facility]   PATIENT NAME: James Sanford    MR#:  [id number]  DATE OF BIRTH:  Jul 29, 1955  DATE OF ADMISSION:  01/25/2020 ADMITTING PHYSICIAN: Rometta Emery, MD  DATE OF DISCHARGE: 02/06/2020  PRIMARY CARE PHYSICIAN: Jerl Mina, MD    ADMISSION DIAGNOSIS:  Alcohol abuse [F10.10] Pain [R52] GI bleed [K92.2] Acute GI bleeding [K92.2]  DISCHARGE DIAGNOSIS:  Rectal bleed suspect diverticular Closed minimally displaced periprosthetic rightsupracondylar femur fracture s/p ORIF H/o Chronic alcoholism Paroxysmal afib --on chronic anticoagulation  SECONDARY DIAGNOSIS:   Past Medical History:  Diagnosis Date  . Arthritis   . Diabetes (HCC)   . Hyperlipemia   . Hypertension   . Sleep apnea     HOSPITAL COURSE:   James Sanford is a 64 y.o. male with medical history significant of alcohol abuse, diabetes, hyperlipidemia, paroxysmal atrial fibrillation hypertension, obstructive sleep apnea, who was brought in by wife secondary to rectal bleed.  Wife reported multiple episode of bright red rectal bleed.  There was also some dark tarry stool.  Patient is excessive alcohol of 2 5 drinks a day   1.  Rectal bleed Likely due to diverticular disease. Upper GI endoscopy negative for any acute bleeding. Continue Protonix p.o. Colonoscopy was performed twice.  Prep was poor twice.  No acute bleeding reported.  No acute intervention was performed other than biopsy of the polyp. Continue Eliquis Monitor H&H and transfuse hemoglobin less than 7. -hgb 8.3--no more episodes reported  2.  Alcohol abuse No evidence of withdrawal for now. -cont MVI  3.  Type 2 diabetes mellitus, controlled. Continue sliding scale insulin for now.  4.  Paroxysmal A. fib On Eliquis. Rate currently controlled.  5.  Closed minimally displaced periprosthetic rightsupracondylar femur fracture X-ray actually shows periprosthetic fracture. Status  post ORIF by Ortho on 10/26 Ok from ortho standpoint for D/c by Dr Joice Lofts  6.  Thrombocytopenia, chronic, improving Plt count  173K  7.  Emphysema Emphysematous changes Seen on CT scan. Likely patient has COPD.  Outpatient work-up recommended.  8.  Depression. Concern for suicidal ideation. Psychiatry input appreciated. Patient does not have suicidal ideation right now. Does not meet inpatient psychiatric criteria. Discontinue sitter.  9.  Hypotension. H/o HTN Resolved now--resumed BP meds low dose lisinopril and low dose BB  10.  Leukocytosis. Likely stress reaction. Monitor.  Patient does not have any acute complaints.  11.  Bowel incontinence -chronic and intermittent in nature Reported by family  I recommend outpatient evaluation by neurology as outpt. D/w wife to get referral thru PCP dr hedrikc   He could have diabetic neuropathy and some autonomic disturbances  Diet: Regular diet.  DVT Prophylaxis:   Place TED hose Start: 01/29/20 1724 Foot Pump / plexipulse Start: 01/29/20 1724    Advance goals of care discussion: Full code  Family Communication: Updated patient's wife [name] over phone @ [phone] on 02/06/2020. In agreement with d/c plans  Disposition:  Status is: Inpatient   Dispo: The patient is from: Home  Anticipated d/c is to: SNF  Anticipated d/c date is: today  Patient currently is medically stable to d/c.[facility] received insurance authorization for [facility] and will d/c today  CONSULTS OBTAINED:  Treatment Team:  Poggi, Excell Seltzer, MD  DRUG ALLERGIES:   Allergies  Allergen Reactions  . Penicillins     Unknown childhood reaction    DISCHARGE MEDICATIONS:   Allergies as of 02/06/2020  Reactions   Penicillins    Unknown childhood reaction      Medication List    STOP taking these medications   escitalopram 10 MG tablet Commonly known as: LEXAPRO     TAKE these medications    B-complex with vitamin C tablet Take 1 tablet by mouth daily.   cyanocobalamin 1000 MCG tablet Take 1 tablet (1,000 mcg total) by mouth daily. Start taking on: February 07, 2020   Eliquis 5 MG Tabs tablet Generic drug: apixaban Take 5 mg by mouth 2 (two) times daily.   Fish Oil 1000 MG Caps Take 1,000 mg by mouth daily.   folic acid 1 MG tablet Commonly known as: FOLVITE Take 1 tablet (1 mg total) by mouth daily. Start taking on: February 07, 2020   Garlic 1200 MG Caps Take 1,200 mg by mouth daily.   lisinopril 5 MG tablet Commonly known as: ZESTRIL Take 5 mg by mouth daily.   metoprolol succinate 50 MG 24 hr tablet Commonly known as: TOPROL-XL Take 0.5 tablets (25 mg total) by mouth daily. What changed: how much to take   Multi-Vitamin tablet Take 1 tablet by mouth daily.   oxyCODONE 5 MG immediate release tablet Commonly known as: Oxy IR/ROXICODONE Take 1 tablet (5 mg total) by mouth every 4 (four) hours as needed for moderate pain.   thiamine 100 MG tablet Take 1 tablet (100 mg total) by mouth daily. Start taking on: February 07, 2020            Discharge Care Instructions  (From admission, onward)         Start     Ordered   02/06/20 0000  Discharge wound care:       Comments: Per Ortho keep surgical incision area dry.   02/06/20 1119          If you experience worsening of your admission symptoms, develop shortness of breath, life threatening emergency, suicidal or homicidal thoughts you must seek medical attention immediately by calling 911 or calling your MD immediately  if symptoms less severe.  You Must read complete instructions/literature along with all the possible adverse reactions/side effects for all the Medicines you take and that have been prescribed to you. Take any new Medicines after you have completely understood and accept all the possible adverse reactions/side effects.   Please note  You were cared for by a hospitalist during  your hospital stay. If you have any questions about your discharge medications or the care you received while you were in the hospital after you are discharged, you can call the unit and asked to speak with the hospitalist on call if the hospitalist that took care of you is not available. Once you are discharged, your primary care physician will handle any further medical issues. Please note that NO REFILLS for any discharge medications will be authorized once you are discharged, as it is imperative that you return to your primary care physician (or establish a relationship with a primary care physician if you do not have one) for your aftercare needs so that they can reassess your need for medications and monitor your lab values. Today   SUBJECTIVE   No new complaints  VITAL SIGNS:  Blood pressure 125/80, pulse 63, temperature 99.1 F (37.3 C), temperature source Oral, resp. rate 16, height 6' (1.829 m), weight 85.3 kg, SpO2 100 %.  I/O:    Intake/Output Summary (Last 24 hours) at 02/06/2020 1122 Last data filed at 02/06/2020 1000 Gross  per 24 hour  Intake 480 ml  Output 650 ml  Net -170 ml    PHYSICAL EXAMINATION:  GENERAL:  64 y.o.-year-old patient lying in the bed with no acute distress. Looks older than his age  HEENT: Head atraumatic, normocephalic. Oropharynx and nasopharynx clear.  NECK:  Supple, no jugular venous distention. No thyroid enlargement, no tenderness.  LUNGS: Normal breath sounds bilaterally, no wheezing, rales,rhonchi or crepitation. No use of accessory muscles of respiration.  CARDIOVASCULAR: S1, S2 normal. No murmurs, rubs, or gallops.  ABDOMEN: Soft, non-tender, non-distended. Bowel sounds present. No organomegaly or mass.  EXTREMITIES: No pedal edema, cyanosis, or clubbing.  NEUROLOGIC: Cranial nerves II through XII are intact. Muscle strength 5/5 in all extremities. Sensation intact. Gait not checked.  PSYCHIATRIC:  patient is alert and oriented x 3.  SKIN: No  obvious rash, lesion, or ulcer.   DATA REVIEW:   CBC  Recent Labs  Lab 02/02/20 0756  WBC 5.4  HGB 8.3*  HCT 25.2*  PLT 173    Chemistries  Recent Labs  Lab 02/02/20 0756  NA 141  K 3.5  CL 108  CO2 27  GLUCOSE 91  BUN 13  CREATININE 0.53*  CALCIUM 7.5*    Microbiology Results   No results found for this or any previous visit (from the past 240 hour(s)).  RADIOLOGY:  No results found.   CODE STATUS:     Code Status Orders  (From admission, onward)         Start     Ordered   01/25/20 2119  Full code  Continuous        01/25/20 2118        Code Status History    Date Active Date Inactive Code Status Order ID Comments User Context   10/29/2017 1711 10/31/2017 1920 Full Code [id number]  Enid Baas, MD Inpatient   Advance Care Planning Activity       TOTAL TIME TAKING CARE OF THIS PATIENT: *35* minutes.    Enedina Finner M.D  [facility]    CC: Primary care physician; Jerl Mina, MD

## 2020-02-06 NOTE — Progress Notes (Signed)
Physical Therapy Treatment Patient Details Name: James Sanford MRN: [id number] DOB: [birth year] Today's Date: 02/06/2020    History of Present Illness James Sanford is a 64yoM (PMH: ETOH abuse, DM, HLD, PAF, HTN, OSA) who comes to [facility] 10/22 after a fall at home c subsequent Rt knee pain. PTA pt experiencing BRBPR. Imaging reveals Rt femur frature. Underwent EGD 10/24, colonoscopy 10/25. Underwent ORIF of Right distal femur fracture (periprosthetic) c Dr. Joice Lofts on 10/26. Pt is NWB RLE postoperatively.    PT Comments    Pt needing to use commode upon arrival.  OOB with supervision and able to hop to commode and then continue gait 100' with good ability to maintain NWB during gait.  Brace unlocked for AROM R knee.  Offered and encouraged pt to attempt stair training but he refused stating he would deal with it when he got home.  Did not seem open to discussion and further education.  Insurance Berkley Harvey remains barrier for discharge.  If pt does not receive auth discharge home is the only option he will need a walker, 3-in-1 commode, wheelchair with elevating leg rest and a ramp as he and wife have not proceeded with having their son build planned and encouraged ramp.  Will discuss again with wife during PM session but she was very firm this week-end that home was not an option and she was unable to provide care for him.  Patient suffers from periprosthetic femur fracturewhich impairs his/her ability to perform daily activities like toileting, feeding, dressing, grooming, bathing in the home. A cane, walker, crutch will not resolve the patient's issue with performing activities of daily living. A lightweight wheelchair and cushion is required/recommended and will allow patient to safely perform daily activities.   Patient can safely propel the wheelchair in the home or has a caregiver who can provide assistance.   Of note, pt with large soft light brown almost white/chalky color BM.  RN  notified.   Follow Up Recommendations  SNF;Supervision for mobility/OOB;Other (comment)     Equipment Recommendations  Rolling walker with 5" wheels;3in1 (PT);Wheelchair (measurements PT);Wheelchair cushion (measurements PT)    Recommendations for Other Services       Precautions / Restrictions Precautions Precautions: Fall Required Braces or Orthoses: Knee Immobilizer - Right Knee Immobilizer - Right: On at all times Restrictions Weight Bearing Restrictions: Yes RLE Weight Bearing: Non weight bearing Other Position/Activity Restrictions: locked in extension with ambulation    Mobility  Bed Mobility Overal bed mobility: Modified Independent                Transfers Overall transfer level: Needs assistance Equipment used: Rolling walker (2 wheeled) Transfers: Sit to/from Stand Sit to Stand: Supervision            Ambulation/Gait Ambulation/Gait assistance: Min guard Gait Distance (Feet): 100 Feet Assistive device: Rolling walker (2 wheeled) Gait Pattern/deviations: Step-to pattern Gait velocity: decreased   General Gait Details: hop-to pattern throughout; CGA for safety;   Stairs         General stair comments: refuses offer to attempt   Wheelchair Mobility    Modified Rankin (Stroke Patients Only)       Balance Overall balance assessment: History of Falls;Needs assistance Sitting-balance support: Feet supported;No upper extremity supported Sitting balance-Leahy Scale: Good Sitting balance - Comments: no LOB noted   Standing balance support: Bilateral upper extremity supported;During functional activity Standing balance-Leahy Scale: Fair Standing balance comment: reliant on RW for balance with BUE on RW; CGA for safety  Cognition Arousal/Alertness: Awake/alert Behavior During Therapy: WFL for tasks assessed/performed Overall Cognitive Status: Difficult to assess                                  General Comments: Pt seems to have some cognitive deficits which may be leading to some confusion and miscommunication with wife.      Exercises Other Exercises Other Exercises: to commode for BM Other Exercises: knee barce unlocked for AROM and re-locked after session    General Comments        Pertinent Vitals/Pain Pain Assessment: No/denies pain    Home Living                      Prior Function            PT Goals (current goals can now be found in the care plan section) Progress towards PT goals: Progressing toward goals    Frequency    BID      PT Plan Current plan remains appropriate    Co-evaluation              AM-PAC PT "6 Clicks" Mobility   Outcome Measure  Help needed turning from your back to your side while in a flat bed without using bedrails?: None Help needed moving from lying on your back to sitting on the side of a flat bed without using bedrails?: None Help needed moving to and from a bed to a chair (including a wheelchair)?: A Little Help needed standing up from a chair using your arms (e.g., wheelchair or bedside chair)?: A Little Help needed to walk in hospital room?: A Little Help needed climbing 3-5 steps with a railing? : A Lot 6 Click Score: 19    End of Session Equipment Utilized During Treatment: Right knee immobilizer;Gait belt Activity Tolerance: Patient tolerated treatment well Patient left: in chair;with call bell/phone within reach;with chair alarm set         Time: 1540-0867 PT Time Calculation (min) (ACUTE ONLY): 24 min  Charges:  $Gait Training: 8-22 mins $Therapeutic Activity: 8-22 mins                    Danielle Dess, PTA 02/06/20, 10:15 AM

## 2020-02-06 NOTE — Progress Notes (Signed)
Pt transported to facility via EMS.  Pt discharge paperwork given to EMS.  VSS, No s/s of distress.

## 2020-02-06 NOTE — TOC Progression Note (Addendum)
Transition of Care [facility]) - Progression Note    Patient Details  Name: James Sanford MRN: [id number] Date of Birth: May 31, 1955  [facility] CM/SW Contact  Liliana Cline, LCSW Phone Number: 02/06/2020, 9:58 AM  Clinical Narrative:   CSW reached out to [place] and Thayer Ohm at [employer] again regarding insurance auth status. Thayer Ohm reported he sent additional information to insurance as requested, no authorization yet. Asked for Thayer Ohm to keep CSW updated on authorization.  11:10- Updated by Thayer Ohm with [facility] that they have insurance authorization. Waiting for confirmation that they have a bed for patient today. Updated MD and RN.    Expected Discharge Plan: Home/Self Care Barriers to Discharge: Continued Medical Work up  Expected Discharge Plan and Services Expected Discharge Plan: Home/Self Care In-house Referral: Clinical Social Work     Living arrangements for the past 2 months: Single Family Home                                       Social Determinants of Health (SDOH) Interventions    Readmission Risk Interventions No flowsheet data found.

## 2020-02-06 NOTE — Plan of Care (Signed)
  Problem: Health Behavior/Discharge Planning: Goal: Ability to manage health-related needs will improve Outcome: Progressing   Problem: Clinical Measurements: Goal: Ability to maintain clinical measurements within normal limits will improve Outcome: Progressing Goal: Will remain free from infection Outcome: Progressing   

## 2020-02-06 NOTE — TOC Transition Note (Signed)
Transition of Care [facility]) - CM/SW Discharge Note   Patient Details  Name: James Sanford MRN: [id number] Date of Birth: [birth year]  Transition of [facility]) CM/SW Contact:  Liliana Cline, LCSW Phone Number: 02/06/2020, 12:27 PM   Clinical Narrative:   Patient to discharge to [facility] in [place] today. CSW updated MD, RN, and patient's wife (left a voicemail and encouraged return call with any questions). Patient will be in room 610A. Medical Necessity Form and Face Sheet placed in Discharge Packet by patient's chart. CSW will call EMS when notified by RN that patient is ready.    Final next level of care: Skilled Nursing Facility Barriers to Discharge: Barriers Resolved   Patient Goals and CMS Choice Patient states their goals for this hospitalization and ongoing recovery are:: SNF CMS Medicare.gov Compare Post Acute Care list provided to:: Patient Choice offered to / list presented to : Patient, Spouse  Discharge Placement              Patient chooses bed at: [facility] [place] Patient to be transferred to facility by: EMS Name of family member notified: James Sanford (wife). Patient informed by RN James Sanford Patient and family notified of of transfer: 02/06/20  Discharge Plan and Services In-house Referral: Clinical Social Work                                   Social Determinants of Health (SDOH) Interventions     Readmission Risk Interventions No flowsheet data found.

## 2020-02-11 NOTE — Anesthesia Postprocedure Evaluation (Signed)
Anesthesia Post Note  Patient: [place] Britten Murthy  Procedure(s) Performed: OPEN REDUCTION INTERNAL FIXATION (ORIF) DISTAL FEMUR FRACTURE (Right )  Patient location during evaluation: PACU Anesthesia Type: General Level of consciousness: awake and alert Pain management: pain level controlled Vital Signs Assessment: post-procedure vital signs reviewed and stable Respiratory status: spontaneous breathing, nonlabored ventilation, respiratory function stable and patient connected to nasal cannula oxygen Cardiovascular status: blood pressure returned to baseline and stable Postop Assessment: no apparent nausea or vomiting Anesthetic complications: no   No complications documented.   Last Vitals:  Vitals:   02/06/20 1444 02/06/20 1603  BP: 131/81 112/85  Pulse: 76 67  Resp: 18 16  Temp: 36.9 C 36.9 C  SpO2: 100% 95%    Last Pain:  Vitals:   02/06/20 1603  TempSrc: Oral  PainSc:                  Yevette Edwards

## 2020-11-23 ENCOUNTER — Inpatient Hospital Stay
Admission: EM | Admit: 2020-11-23 | Discharge: 2020-12-27 | DRG: 480 | Disposition: A | Discharge status: Other Acute Inpatient Hospital | Payer: BC Managed Care – PPO | Attending: Family Medicine | Admitting: Family Medicine

## 2020-11-23 ENCOUNTER — Emergency Department: Payer: BC Managed Care – PPO

## 2020-11-23 ENCOUNTER — Other Ambulatory Visit: Payer: Self-pay

## 2020-11-23 ENCOUNTER — Encounter: Payer: Self-pay | Admitting: Emergency Medicine

## 2020-11-23 DIAGNOSIS — F1010 Alcohol abuse, uncomplicated: Secondary | ICD-10-CM

## 2020-11-23 DIAGNOSIS — E876 Hypokalemia: Secondary | ICD-10-CM | POA: Diagnosis not present

## 2020-11-23 DIAGNOSIS — D72829 Elevated white blood cell count, unspecified: Secondary | ICD-10-CM | POA: Diagnosis present

## 2020-11-23 DIAGNOSIS — R159 Full incontinence of feces: Secondary | ICD-10-CM | POA: Diagnosis not present

## 2020-11-23 DIAGNOSIS — F321 Major depressive disorder, single episode, moderate: Secondary | ICD-10-CM | POA: Diagnosis present

## 2020-11-23 DIAGNOSIS — Z6823 Body mass index (BMI) 23.0-23.9, adult: Secondary | ICD-10-CM

## 2020-11-23 DIAGNOSIS — F1020 Alcohol dependence, uncomplicated: Secondary | ICD-10-CM | POA: Diagnosis present

## 2020-11-23 DIAGNOSIS — D509 Iron deficiency anemia, unspecified: Secondary | ICD-10-CM | POA: Diagnosis present

## 2020-11-23 DIAGNOSIS — N390 Urinary tract infection, site not specified: Secondary | ICD-10-CM | POA: Diagnosis present

## 2020-11-23 DIAGNOSIS — M1990 Unspecified osteoarthritis, unspecified site: Secondary | ICD-10-CM | POA: Diagnosis present

## 2020-11-23 DIAGNOSIS — Z87891 Personal history of nicotine dependence: Secondary | ICD-10-CM

## 2020-11-23 DIAGNOSIS — S72002S Fracture of unspecified part of neck of left femur, sequela: Secondary | ICD-10-CM

## 2020-11-23 DIAGNOSIS — Z7901 Long term (current) use of anticoagulants: Secondary | ICD-10-CM

## 2020-11-23 DIAGNOSIS — Z823 Family history of stroke: Secondary | ICD-10-CM

## 2020-11-23 DIAGNOSIS — Z880 Allergy status to penicillin: Secondary | ICD-10-CM

## 2020-11-23 DIAGNOSIS — E119 Type 2 diabetes mellitus without complications: Secondary | ICD-10-CM | POA: Diagnosis present

## 2020-11-23 DIAGNOSIS — R296 Repeated falls: Secondary | ICD-10-CM | POA: Diagnosis present

## 2020-11-23 DIAGNOSIS — Y92009 Unspecified place in unspecified non-institutional (private) residence as the place of occurrence of the external cause: Secondary | ICD-10-CM

## 2020-11-23 DIAGNOSIS — S72145A Nondisplaced intertrochanteric fracture of left femur, initial encounter for closed fracture: Secondary | ICD-10-CM

## 2020-11-23 DIAGNOSIS — E43 Unspecified severe protein-calorie malnutrition: Secondary | ICD-10-CM | POA: Diagnosis present

## 2020-11-23 DIAGNOSIS — S72142A Displaced intertrochanteric fracture of left femur, initial encounter for closed fracture: Secondary | ICD-10-CM | POA: Diagnosis not present

## 2020-11-23 DIAGNOSIS — Z419 Encounter for procedure for purposes other than remedying health state, unspecified: Secondary | ICD-10-CM

## 2020-11-23 DIAGNOSIS — R45851 Suicidal ideations: Secondary | ICD-10-CM | POA: Diagnosis not present

## 2020-11-23 DIAGNOSIS — M25562 Pain in left knee: Secondary | ICD-10-CM

## 2020-11-23 DIAGNOSIS — Z96653 Presence of artificial knee joint, bilateral: Secondary | ICD-10-CM | POA: Diagnosis present

## 2020-11-23 DIAGNOSIS — W19XXXA Unspecified fall, initial encounter: Principal | ICD-10-CM | POA: Diagnosis present

## 2020-11-23 DIAGNOSIS — E440 Moderate protein-calorie malnutrition: Secondary | ICD-10-CM | POA: Insufficient documentation

## 2020-11-23 DIAGNOSIS — S72009A Fracture of unspecified part of neck of unspecified femur, initial encounter for closed fracture: Secondary | ICD-10-CM

## 2020-11-23 DIAGNOSIS — R7301 Impaired fasting glucose: Secondary | ICD-10-CM

## 2020-11-23 DIAGNOSIS — Z9884 Bariatric surgery status: Secondary | ICD-10-CM

## 2020-11-23 DIAGNOSIS — E785 Hyperlipidemia, unspecified: Secondary | ICD-10-CM | POA: Diagnosis present

## 2020-11-23 DIAGNOSIS — I10 Essential (primary) hypertension: Secondary | ICD-10-CM | POA: Diagnosis present

## 2020-11-23 DIAGNOSIS — R197 Diarrhea, unspecified: Secondary | ICD-10-CM

## 2020-11-23 DIAGNOSIS — K529 Noninfective gastroenteritis and colitis, unspecified: Secondary | ICD-10-CM | POA: Diagnosis present

## 2020-11-23 DIAGNOSIS — Z20822 Contact with and (suspected) exposure to covid-19: Secondary | ICD-10-CM | POA: Diagnosis present

## 2020-11-23 DIAGNOSIS — I480 Paroxysmal atrial fibrillation: Secondary | ICD-10-CM | POA: Diagnosis present

## 2020-11-23 DIAGNOSIS — Z79899 Other long term (current) drug therapy: Secondary | ICD-10-CM

## 2020-11-23 LAB — CBC
HCT: 38.2 % — ABNORMAL LOW (ref 39.0–52.0)
Hemoglobin: 13 g/dL (ref 13.0–17.0)
MCH: 33.6 pg (ref 26.0–34.0)
MCHC: 34 g/dL (ref 30.0–36.0)
MCV: 98.7 fL (ref 80.0–100.0)
Platelets: 148 10*3/uL — ABNORMAL LOW (ref 150–400)
RBC: 3.87 MIL/uL — ABNORMAL LOW (ref 4.22–5.81)
RDW: 17.2 % — ABNORMAL HIGH (ref 11.5–15.5)
WBC: 6.6 10*3/uL (ref 4.0–10.5)
nRBC: 0 % (ref 0.0–0.2)

## 2020-11-23 LAB — URINALYSIS, COMPLETE (UACMP) WITH MICROSCOPIC
Glucose, UA: NEGATIVE mg/dL
Ketones, ur: NEGATIVE mg/dL
Nitrite: POSITIVE — AB
Specific Gravity, Urine: >1.03 — ABNORMAL HIGH (ref 1.005–1.030)
WBC, UA: >50 WBC/hpf — ABNORMAL HIGH (ref 0–5)
pH: 5 (ref 5.0–8.0)

## 2020-11-23 LAB — BASIC METABOLIC PANEL
Anion gap: 9 (ref 5–15)
BUN: 14 mg/dL (ref 8–23)
CO2: 26 mmol/L (ref 22–32)
Calcium: 8.1 mg/dL — ABNORMAL LOW (ref 8.9–10.3)
Chloride: 101 mmol/L (ref 98–111)
Creatinine, Ser: 0.74 mg/dL (ref 0.61–1.24)
GFR, Estimated: >60 mL/min (ref >60)
Glucose, Bld: 155 mg/dL — ABNORMAL HIGH (ref 70–99)
Potassium: 4.7 mmol/L (ref 3.5–5.1)
Sodium: 136 mmol/L (ref 135–145)

## 2020-11-23 NOTE — ED Triage Notes (Addendum)
Pt via EMS from home. Per first nurse note, pt is non ambulatory at baseline but when he drinks he thinks he can walk. Pt denies drinking last night. Family reports 6 falls last night. Pt c/o L knee pain. Denies head injury. Denies LOC. Pt is A&Ox4 and NAD.

## 2020-11-23 NOTE — ED Provider Notes (Signed)
[facility] Emergency Department Provider Note   ____________________________________________   Event Date/Time   First MD Initiated Contact with Patient 11/23/20 1849     (approximate)  I have reviewed the triage vital signs and the nursing notes.   HISTORY  Chief Complaint Fall and Knee Pain    HPI James Sanford is a 65 y.o. male who presents via EMS from home complaining of left knee pain after multiple falls last night while intoxicated  LOCATION: Left knee DURATION: 24 hours prior to arrival TIMING: Slightly improved since onset SEVERITY: 6/10 QUALITY: Aching CONTEXT: Patient had at least 6 falls last night resulting in this left knee pain while he was intoxicated and per wife at bedside patient is nonambulatory at baseline MODIFYING FACTORS: Worse with movement and slightly relieved at rest ASSOCIATED SYMPTOMS: Denies   Per medical record review patient has long history of alcohol abuse as well as alcoholic cardiomyopathy and paroxysmal A. fib          Past Medical History:  Diagnosis Date   Arthritis    Diabetes (HCC)    Hyperlipemia    Hypertension    Sleep apnea     Patient Active Problem List   Diagnosis Date Noted   Conceited it was a great history will call admit 01/28/2020   Hyperlipemia    Acute GI bleeding 01/25/2020   Diabetes mellitus type 2, uncomplicated (HCC) 01/25/2020   Hyperlipidemia 01/25/2020   Obesity 01/25/2020   Hypertension 01/25/2020   Sleep apnea 01/25/2020   Alcohol abuse 03/07/2019   Cardiomyopathy, idiopathic (HCC) 03/07/2019   Paroxysmal A-fib (HCC) 11/07/2017   Radius fracture 10/29/2017    Past Surgical History:  Procedure Laterality Date   COLONOSCOPY WITH PROPOFOL N/A 01/28/2020   Procedure: COLONOSCOPY WITH PROPOFOL;  Surgeon: Toney Reil, MD;  Location: [facility] ENDOSCOPY;  Service: Gastroenterology;  Laterality: N/A;   COLONOSCOPY WITH PROPOFOL N/A 01/28/2020    Procedure: COLONOSCOPY WITH PROPOFOL;  Surgeon: Toney Reil, MD;  Location: [facility] ENDOSCOPY;  Service: Gastroenterology;  Laterality: N/A;   ESOPHAGOGASTRODUODENOSCOPY (EGD) WITH PROPOFOL N/A 01/28/2020   Procedure: ESOPHAGOGASTRODUODENOSCOPY (EGD) WITH PROPOFOL;  Surgeon: Toney Reil, MD;  Location: [facility] ENDOSCOPY;  Service: Gastroenterology;  Laterality: N/A;   GASTRIC BYPASS     HERNIA REPAIR     JOINT REPLACEMENT Bilateral    Knee surgery   OPEN REDUCTION INTERNAL FIXATION (ORIF) DISTAL RADIAL FRACTURE Right 10/30/2017   Procedure: OPEN REDUCTION INTERNAL FIXATION (ORIF) DISTAL RADIAL FRACTURE;  Surgeon: Garnette Gunner, MD;  Location: [facility] ORS;  Service: Orthopedics;  Laterality: Right;   ORIF FEMUR FRACTURE Right 01/29/2020   Procedure: OPEN REDUCTION INTERNAL FIXATION (ORIF) DISTAL FEMUR FRACTURE;  Surgeon: Christena Flake, MD;  Location: [facility] ORS;  Service: Orthopedics;  Laterality: Right;    Prior to Admission medications   Medication Sig Start Date End Date Taking? Authorizing Provider  B Complex-C (B-COMPLEX WITH VITAMIN C) tablet Take 1 tablet by mouth daily.    [provider]  ELIQUIS 5 MG TABS tablet Take 5 mg by mouth 2 (two) times daily. 01/05/20   [provider]  folic acid (FOLVITE) 1 MG tablet Take 1 tablet (1 mg total) by mouth daily. 02/07/20   Enedina Finner, MD  Garlic 1200 MG CAPS Take 1,200 mg by mouth daily.    [provider]  lisinopril (ZESTRIL) 5 MG tablet Take 5 mg by mouth daily. 08/29/19   [provider]  metoprolol succinate (TOPROL-XL) 50  MG 24 hr tablet Take 0.5 tablets (25 mg total) by mouth daily. 02/06/20   Enedina Finner, MD  Multiple Vitamin (MULTI-VITAMIN) tablet Take 1 tablet by mouth daily.    [provider]  Omega-3 Fatty Acids (FISH OIL) 1000 MG CAPS Take 1,000 mg by mouth daily.    [provider]  oxyCODONE (OXY IR/ROXICODONE) 5 MG immediate release tablet Take 1 tablet (5 mg total) by  mouth every 4 (four) hours as needed for moderate pain. 01/31/20   Anson Oregon, PA-C  thiamine 100 MG tablet Take 1 tablet (100 mg total) by mouth daily. 02/07/20   Enedina Finner, MD  vitamin B-12 1000 MCG tablet Take 1 tablet (1,000 mcg total) by mouth daily. 02/07/20   Enedina Finner, MD    Allergies Penicillins  Family History  Problem Relation Age of Onset   Stroke Mother    Suicidality Father     Social History Social History   Tobacco Use   Smoking status: Never   Smokeless tobacco: Former    Types: Chew   Tobacco comments:    quir about 4 years ago  Vaping Use   Vaping Use: Never used  Substance Use Topics   Alcohol use: Yes    Comment: beer occasionally   Drug use: Not Currently    Review of Systems Constitutional: No fever/chills Eyes: No visual changes. ENT: No sore throat. Cardiovascular: Denies chest pain. Respiratory: Denies shortness of breath. Gastrointestinal: No abdominal pain.  No nausea, no vomiting.  No diarrhea. Genitourinary: Negative for dysuria. Musculoskeletal: Positive for acute left knee pain Skin: Negative for rash. Neurological: Negative for headaches, weakness/numbness/paresthesias in any extremity Psychiatric: Negative for suicidal ideation/homicidal ideation   ____________________________________________   PHYSICAL EXAM:  VITAL SIGNS: ED Triage Vitals  Enc Vitals Group     BP 11/23/20 1441 (!) 117/92     Pulse Rate 11/23/20 1441 89     Resp 11/23/20 1441 20     Temp 11/23/20 1441 99.4 F (37.4 C)     Temp Source 11/23/20 1441 Oral     SpO2 11/23/20 1441 100 %     Weight 11/23/20 1438 170 lb (77.1 kg)     Height 11/23/20 1438 6' (1.829 m)     Head Circumference --      Peak Flow --      Pain Score 11/23/20 1437 6     Pain Loc --      Pain Edu? --      Excl. in GC? --    Constitutional: Alert and oriented. Well appearing and in no acute distress. Eyes: Conjunctivae are normal. PERRL. Head: Atraumatic. Nose: No  congestion/rhinnorhea. Mouth/Throat: Mucous membranes are moist. Neck: No stridor Cardiovascular: Grossly normal heart sounds.  Good peripheral circulation. Respiratory: Normal respiratory effort.  No retractions. Gastrointestinal: Soft and nontender. No distention. Musculoskeletal: No obvious deformities.  Pain with range of motion of the left knee Neurologic:  Normal speech and language. No gross focal neurologic deficits are appreciated. Skin:  Skin is warm and dry. No rash noted. Psychiatric: Mood and affect are normal. Speech and behavior are normal.  ____________________________________________   LABS (all labs ordered are listed, but only abnormal results are displayed)  Labs Reviewed  BASIC METABOLIC PANEL - Abnormal; Notable for the following components:      Result Value   Glucose, Bld 155 (*)    Calcium 8.1 (*)    All other components within normal limits  CBC - Abnormal; Notable for  the following components:   RBC 3.87 (*)    HCT 38.2 (*)    RDW 17.2 (*)    Platelets 148 (*)    All other components within normal limits  RESP PANEL BY RT-PCR (FLU A&B, COVID) ARPGX2  URINALYSIS, COMPLETE (UACMP) WITH MICROSCOPIC  CBG MONITORING, ED   ____________________________________________  EKG  ED ECG REPORT I, Merwyn Katos, the attending physician, personally viewed and interpreted this ECG.  Date: 11/23/2020 EKG Time: 1441 Rate: 93 Rhythm: Atrial fibrillation QRS Axis: normal Intervals: normal ST/T Wave abnormalities: normal Narrative Interpretation: Atrial fibrillation.  No evidence of acute ischemia  ____________________________________________  RADIOLOGY  ED MD interpretation: CT of the head without contrast shows no evidence of acute abnormalities including no intracerebral hemorrhage, obvious masses, or significant edema  Two-view portable x-ray of the left knee shows a total knee arthroplasty without any acute bony abnormalities  Official radiology  report(s): CT HEAD WO CONTRAST ( )  Result Date: 11/23/2020 CLINICAL DATA:  Altered mental status. EXAM: CT HEAD WITHOUT CONTRAST TECHNIQUE: Contiguous axial images were obtained from the base of the skull through the vertex without intravenous contrast. COMPARISON:  Head CT dated 02/21/2019. FINDINGS: Brain: Mild age-related atrophy and chronic microvascular ischemic changes. There is no acute intracranial hemorrhage. No mass effect or midline shift. No extra-axial fluid collection. Vascular: No hyperdense vessel or unexpected calcification. Skull: Normal. Negative for fracture or focal lesion. Sinuses/Orbits: No acute finding. Other: None IMPRESSION: 1. No acute intracranial pathology. 2. Mild chronic microvascular ischemic changes. Electronically Signed   By: Elgie Collard M.D.   On: 11/23/2020 19:40   DG Knee Left Port  Result Date: 11/23/2020 CLINICAL DATA:  Left knee injury.  Pain.  Six falls last night. EXAM: PORTABLE LEFT KNEE - 1-2 VIEW COMPARISON:  10/18/2007 FINDINGS: Left total knee arthroplasty with patellar femoral component. Components appear well seated. No evidence of acute fracture or dislocation. No focal bone lesion or bone destruction. No significant effusions. Vascular calcifications in the soft tissues. IMPRESSION: Left total knee arthroplasty.  No acute bony abnormalities. Electronically Signed   By: Burman Nieves M.D.   On: 11/23/2020 18:46    ____________________________________________   PROCEDURES  Procedure(s) performed (including Critical Care):  .1-3 Lead EKG Interpretation  Date/Time: 11/23/2020 10:45 PM Performed by: Merwyn Katos, MD Authorized by: Merwyn Katos, MD     Interpretation: normal     ECG rate:  79   ECG rate assessment: normal     Rhythm: sinus rhythm     Ectopy: none     Conduction: normal     ____________________________________________   INITIAL IMPRESSION / ASSESSMENT AND PLAN / ED COURSE  As part of my medical decision  making, I reviewed the following data within the electronic medical record, if available:  Nursing notes reviewed and incorporated, Labs reviewed, EKG interpreted, Old chart reviewed, Radiograph reviewed and Notes from prior ED visits reviewed and incorporated        65 year old male presents for left knee pain after multiple falls the night previously while intoxicated Given history, exam and workup I have low suspicion for fracture, dislocation, significant ligamentous injury, septic arthritis, gout flare, new autoimmune arthropathy, or gonococcal arthropathy.  Interventions: X-ray of the knee as well as CT scan of the head showed no evidence of acute abnormalities  Prior to discharge patient's wife wanted to speak to me in the hallway privately and disclosed that patient's drinking has become much more severe recently including patient voicing suicidal  ideation with a plan to kill himself with a readily accessible handgun.  Patient's wife is also concerned as he has become violent and punched holes in the wall in the past while drinking and believes that this anger/violence may turn to her if his drinking continues.  Thoughts are linear and organized, and patient has no AH, VH, or HI. Prior suicide attempt: Denies Prior Psychiatric Hospitalizations: Never  Clinically patient displays no overt toxidrome; they are well appearing, with low suspicion for toxic ingestion given history and exam. Thoughts unlikely 2/2 anemia, hypothyroidism, infection, or ICH.  Consult: Psychiatry to evaluate patient for potential hold for danger to self. Disposition: Care of this patient will be signed out to the oncoming physician at the end of my shift.  All pertinent patient information conveyed and all questions answered.  All further care and disposition decisions will be made by the oncoming physician.       ____________________________________________   FINAL CLINICAL IMPRESSION(S) / ED  DIAGNOSES  Final diagnoses:  Fall, initial encounter  Acute pain of left knee  Alcohol abuse  Suicidal ideation     ED Discharge Orders     None        Note:  This document was prepared using Dragon voice recognition software and may include unintentional dictation errors.    Merwyn Katos, MD 11/23/20 2245

## 2020-11-23 NOTE — BH Assessment (Signed)
Comprehensive Clinical Assessment (CCA) Note  11/23/2020 James Sanford [id number]  Chief Complaint: Patient is a 65 year old male presenting to [facility] ED under IVC. Per triage note Pt via EMS from home. Per first nurse note, pt is non ambulatory at baseline but when he drinks he thinks he can walk. Pt denies drinking last night. Family reports 6 falls last night. EDP and patient's nurse both report that patient drinks heavily and becomes aggressive when at home such as punching holes in the walls, patient also has been reporting SI. During assessment patient appears alert and oriented x4, calm but guarded. When asked why patient was presenting to the ED patient reports "because I fell." When asked if anything else happened today that would suffice a ED visit, patient denies. Patient also denies that he falls often. Patient denies any current alcohol use, he reports alcohol use in the past. Patient reports that his relationship with his family is good, he is also denies any depression or mental health issues. Patient mainly kept his answers short and without any detail. No current BAL available at this time. Patient denies SI/HI/AH/VH and does not appear to be responding to any internal or external stimuli.  Disposition pending  Chief Complaint  Patient presents with   Fall   Knee Pain   Addiction Problem   Visit Diagnosis: Alcohol Abuse    CCA Screening, Triage and Referral (STR)  Patient Reported Information How did you hear about [place]? Legal System  Referral name: No data recorded Referral phone number: No data recorded  Whom do you see for routine medical problems? No data recorded Practice/Facility Name: No data recorded Practice/Facility Phone Number: No data recorded Name of Contact: No data recorded Contact Number: No data recorded Contact Fax Number: No data recorded Prescriber Name: No data recorded Prescriber Address (if known): No data recorded  What Is the Reason  for Your Visit/Call Today? Patient presents under IVC due to falling at home and alcohol abuse  How Long Has This Been Causing You Problems? > than 6 months  What Do You Feel Would Help You the Most Today? No data recorded  Have You Recently Been in Any Inpatient Treatment (Hospital/Detox/Crisis Center/28-Day Program)? No data recorded Name/Location of Program/Hospital:No data recorded How Long Were You There? No data recorded When Were You Discharged? No data recorded  Have You Ever Received Services From [facility] Before? No data recorded Who Do You See at [facility]? No data recorded  Have You Recently Had Any Thoughts About Hurting Yourself? No  Are You Planning to Commit Suicide/Harm Yourself At This time? No   Have you Recently Had Thoughts About Hurting Someone Karolee Ohs? No  Explanation: No data recorded  Have You Used Any Alcohol or Drugs in the Past 24 Hours? No (Patient denies using any alcohol)  How Long Ago Did You Use Drugs or Alcohol? No data recorded What Did You Use and How Much? No data recorded  Do You Currently Have a Therapist/Psychiatrist? No  Name of Therapist/Psychiatrist: No data recorded  Have You Been Recently Discharged From Any Office Practice or Programs? No  Explanation of Discharge From Practice/Program: No data recorded    CCA Screening Triage Referral Assessment Type of Contact: Face-to-Face  Is this Initial or Reassessment? No data recorded Date Telepsych consult ordered in [facility]:  No data recorded Time Telepsych consult ordered in [facility]:  No data recorded  Patient Reported Information Reviewed? No data recorded Patient Left Without Being Seen? No  data recorded Reason for Not Completing Assessment: No data recorded  Collateral Involvement: No data recorded  Does Patient Have a Court Appointed Legal Guardian? No data recorded Name and Contact of Legal Guardian: No data recorded If Minor and Not Living with Parent(s), Who has Custody? No  data recorded Is CPS involved or ever been involved? Never  Is APS involved or ever been involved? Never   Patient Determined To Be At Risk for Harm To Self or Others Based on Review of Patient Reported Information or Presenting Complaint? No  Method: No data recorded Availability of Means: No data recorded Intent: No data recorded Notification Required: No data recorded Additional Information for Danger to Others Potential: No data recorded Additional Comments for Danger to Others Potential: No data recorded Are There Guns or Other Weapons in Your Home? No data recorded Types of Guns/Weapons: No data recorded Are These Weapons Safely Secured?                            No data recorded Who Could Verify You Are Able To Have These Secured: No data recorded Do You Have any Outstanding Charges, Pending Court Dates, Parole/Probation? No data recorded Contacted To Inform of Risk of Harm To Self or Others: No data recorded  Location of Assessment: [facility] ED   Does Patient Present under Involuntary Commitment? Yes  IVC Papers Initial File Date: 11/23/20   [place] of Residence: [place]   Patient Currently Receiving the Following Services: No data recorded  Determination of Need: Emergent (2 hours)   Options For Referral: No data recorded    CCA Biopsychosocial Intake/Chief Complaint:  No data recorded Current Symptoms/Problems: No data recorded  Patient Reported Schizophrenia/Schizoaffective Diagnosis in Past: No   Strengths: Patient is able to communicate  Preferences: No data recorded Abilities: No data recorded  Type of Services Patient Feels are Needed: No data recorded  Initial Clinical Notes/Concerns: No data recorded  Mental Health Symptoms Depression:   None   Duration of Depressive symptoms: No data recorded  Mania:   None   Anxiety:    None   Psychosis:   None   Duration of Psychotic symptoms: No data recorded  Trauma:   None   Obsessions:    None   Compulsions:   None   Inattention:   None   Hyperactivity/Impulsivity:   None   Oppositional/Defiant Behaviors:   None   Emotional Irregularity:   None   Other Mood/Personality Symptoms:  No data recorded   Mental Status Exam Appearance and self-care  Stature:   Average   Weight:   Average weight   Clothing:   Casual   Grooming:   Normal   Cosmetic use:   None   Posture/gait:   Normal   Motor activity:   Not Remarkable   Sensorium  Attention:   Normal   Concentration:   Normal   Orientation:   X5   Recall/memory:   Normal   Affect and Mood  Affect:   Appropriate   Mood:   Other (Comment)   Relating  Eye contact:   Normal   Facial expression:   Responsive   Attitude toward examiner:   Guarded   Thought and Language  Speech flow:  Clear and Coherent   Thought content:   Appropriate to Mood and Circumstances   Preoccupation:   None   Hallucinations:   None   Organization:  No data recorded  Executive  Functions  Fund of Knowledge:   Fair   Intelligence:   Average   Abstraction:   Normal   Judgement:   Fair   Dance movement psychotherapist:   Realistic   Insight:   Lacking; None/zero insight; Poor   Decision Making:   Impulsive   Social Functioning  Social Maturity:   Impulsive   Social Judgement:   Heedless   Stress  Stressors:   Relationship; Family conflict   Coping Ability:   Exhausted   Skill Deficits:   None   Supports:   Family     Religion: Religion/Spirituality Are You A Religious Person?: No  Leisure/Recreation: Leisure / Recreation Do You Have Hobbies?: No  Exercise/Diet: Exercise/Diet Do You Exercise?: No Have You Gained or Lost A Significant Amount of Weight in the Past Six Months?: No Do You Follow a Special Diet?: No Do You Have Any Trouble Sleeping?: No   CCA Employment/Education Employment/Work Situation: Employment / Work Situation Employment Situation:  Employed Work Stressors: None reported Has Patient ever Been in Equities trader?: No  Education: Education Did Theme park manager?: No Did You Have An Individualized Education Program (IIEP): No Did You Have Any Difficulty At [employer]?: No Patient's Education Has Been Impacted by Current Illness: No   CCA Family/Childhood History Family and Relationship History: Family history Marital status: Married What types of issues is patient dealing with in the relationship?: Patient's alcohol abuse and aggression at home is affecting his marriage Additional relationship information: None reported Does patient have children?: Yes How many children?: 2 How is patient's relationship with their children?: Patient reports relationship with children are good  Childhood History:  Childhood History By whom was/is the patient raised?: Both parents Did patient suffer any verbal/emotional/physical/sexual abuse as a child?: No Did patient suffer from severe childhood neglect?: No Has patient ever been sexually abused/assaulted/raped as an adolescent or adult?: No Was the patient ever a victim of a crime or a disaster?: No Witnessed domestic violence?: No Has patient been affected by domestic violence as an adult?: No  Child/Adolescent Assessment:     CCA Substance Use Alcohol/Drug Use: Alcohol / Drug Use Pain Medications: See MAR Prescriptions: See MAR Over the Counter: See MAR History of alcohol / drug use?: Yes Substance #1 Name of Substance 1: Alcohol 1 - Frequency: Patient is currently denying any current use                       ASAM's:  Six Dimensions of Multidimensional Assessment  Dimension 1:  Acute Intoxication and/or Withdrawal Potential:      Dimension 2:  Biomedical Conditions and Complications:      Dimension 3:  Emotional, Behavioral, or Cognitive Conditions and Complications:     Dimension 4:  Readiness to Change:     Dimension 5:  Relapse, Continued use, or  Continued Problem Potential:     Dimension 6:  Recovery/Living Environment:     ASAM Severity Score:    ASAM Recommended Level of Treatment:     Substance use Disorder (SUD)    Recommendations for Services/Supports/Treatments:  Disposition pending  DSM5 Diagnoses: Patient Active Problem List   Diagnosis Date Noted   Conceited it was a great history will call admit 01/28/2020   Hyperlipemia    Acute GI bleeding 01/25/2020   Diabetes mellitus type 2, uncomplicated (HCC) 01/25/2020   Hyperlipidemia 01/25/2020   Obesity 01/25/2020   Hypertension 01/25/2020   Sleep apnea 01/25/2020   Alcohol abuse 03/07/2019  Cardiomyopathy, idiopathic (HCC) 03/07/2019   Paroxysmal A-fib (HCC) 11/07/2017   Radius fracture 10/29/2017    Patient Centered Plan: Patient is on the following Treatment Plan(s):  Substance Abuse   Referrals to Alternative Service(s): Referred to Alternative Service(s):   Place:   Date:   Time:    Referred to Alternative Service(s):   Place:   Date:   Time:    Referred to Alternative Service(s):   Place:   Date:   Time:    Referred to Alternative Service(s):   Place:   Date:   Time:     Lorma Heater A Terricka Onofrio, LCAS-A

## 2020-11-23 NOTE — ED Notes (Signed)
IVC 

## 2020-11-23 NOTE — ED Notes (Signed)
Pt had BM on self and states "it was because I was in the lobby for so long and everyone seemed busy." Denies attempting to ask anyone for help. Pt placed on bedpan again and given call bell.

## 2020-11-23 NOTE — ED Triage Notes (Signed)
Patient arrived by EMS from home for fall. Family reports 6 falls last night  and intoxicated. They reported he tries to walk when drinking but baseline non ambulatory. EMS reports strong urine smell. HX afib

## 2020-11-24 ENCOUNTER — Emergency Department: Payer: BC Managed Care – PPO

## 2020-11-24 ENCOUNTER — Encounter: Payer: Self-pay | Admitting: Internal Medicine

## 2020-11-24 DIAGNOSIS — R197 Diarrhea, unspecified: Secondary | ICD-10-CM | POA: Diagnosis not present

## 2020-11-24 DIAGNOSIS — E43 Unspecified severe protein-calorie malnutrition: Secondary | ICD-10-CM | POA: Diagnosis present

## 2020-11-24 DIAGNOSIS — F1010 Alcohol abuse, uncomplicated: Secondary | ICD-10-CM

## 2020-11-24 DIAGNOSIS — S72002S Fracture of unspecified part of neck of left femur, sequela: Secondary | ICD-10-CM | POA: Diagnosis not present

## 2020-11-24 DIAGNOSIS — E119 Type 2 diabetes mellitus without complications: Secondary | ICD-10-CM | POA: Diagnosis present

## 2020-11-24 DIAGNOSIS — I10 Essential (primary) hypertension: Secondary | ICD-10-CM | POA: Diagnosis present

## 2020-11-24 DIAGNOSIS — S72142A Displaced intertrochanteric fracture of left femur, initial encounter for closed fracture: Principal | ICD-10-CM

## 2020-11-24 DIAGNOSIS — Y92009 Unspecified place in unspecified non-institutional (private) residence as the place of occurrence of the external cause: Secondary | ICD-10-CM | POA: Diagnosis not present

## 2020-11-24 DIAGNOSIS — S72145S Nondisplaced intertrochanteric fracture of left femur, sequela: Secondary | ICD-10-CM | POA: Diagnosis not present

## 2020-11-24 DIAGNOSIS — R296 Repeated falls: Secondary | ICD-10-CM | POA: Diagnosis present

## 2020-11-24 DIAGNOSIS — D509 Iron deficiency anemia, unspecified: Secondary | ICD-10-CM | POA: Diagnosis present

## 2020-11-24 DIAGNOSIS — N390 Urinary tract infection, site not specified: Secondary | ICD-10-CM | POA: Diagnosis not present

## 2020-11-24 DIAGNOSIS — R45851 Suicidal ideations: Secondary | ICD-10-CM | POA: Diagnosis present

## 2020-11-24 DIAGNOSIS — M1990 Unspecified osteoarthritis, unspecified site: Secondary | ICD-10-CM | POA: Diagnosis present

## 2020-11-24 DIAGNOSIS — Z7901 Long term (current) use of anticoagulants: Secondary | ICD-10-CM | POA: Diagnosis not present

## 2020-11-24 DIAGNOSIS — E876 Hypokalemia: Secondary | ICD-10-CM | POA: Diagnosis not present

## 2020-11-24 DIAGNOSIS — R195 Other fecal abnormalities: Secondary | ICD-10-CM | POA: Diagnosis not present

## 2020-11-24 DIAGNOSIS — F321 Major depressive disorder, single episode, moderate: Secondary | ICD-10-CM | POA: Diagnosis present

## 2020-11-24 DIAGNOSIS — W19XXXA Unspecified fall, initial encounter: Secondary | ICD-10-CM | POA: Diagnosis present

## 2020-11-24 DIAGNOSIS — Z6823 Body mass index (BMI) 23.0-23.9, adult: Secondary | ICD-10-CM | POA: Diagnosis not present

## 2020-11-24 DIAGNOSIS — K591 Functional diarrhea: Secondary | ICD-10-CM | POA: Diagnosis not present

## 2020-11-24 DIAGNOSIS — D72829 Elevated white blood cell count, unspecified: Secondary | ICD-10-CM | POA: Diagnosis present

## 2020-11-24 DIAGNOSIS — Z823 Family history of stroke: Secondary | ICD-10-CM | POA: Diagnosis not present

## 2020-11-24 DIAGNOSIS — F1020 Alcohol dependence, uncomplicated: Secondary | ICD-10-CM | POA: Diagnosis present

## 2020-11-24 DIAGNOSIS — Z87891 Personal history of nicotine dependence: Secondary | ICD-10-CM | POA: Diagnosis not present

## 2020-11-24 DIAGNOSIS — Z96653 Presence of artificial knee joint, bilateral: Secondary | ICD-10-CM | POA: Diagnosis present

## 2020-11-24 DIAGNOSIS — Z980 Intestinal bypass and anastomosis status: Secondary | ICD-10-CM | POA: Diagnosis not present

## 2020-11-24 DIAGNOSIS — R159 Full incontinence of feces: Secondary | ICD-10-CM | POA: Diagnosis not present

## 2020-11-24 DIAGNOSIS — Z79899 Other long term (current) drug therapy: Secondary | ICD-10-CM | POA: Diagnosis not present

## 2020-11-24 DIAGNOSIS — K529 Noninfective gastroenteritis and colitis, unspecified: Secondary | ICD-10-CM | POA: Diagnosis present

## 2020-11-24 DIAGNOSIS — S72145A Nondisplaced intertrochanteric fracture of left femur, initial encounter for closed fracture: Secondary | ICD-10-CM

## 2020-11-24 DIAGNOSIS — I480 Paroxysmal atrial fibrillation: Secondary | ICD-10-CM | POA: Diagnosis present

## 2020-11-24 DIAGNOSIS — Z880 Allergy status to penicillin: Secondary | ICD-10-CM | POA: Diagnosis not present

## 2020-11-24 DIAGNOSIS — E785 Hyperlipidemia, unspecified: Secondary | ICD-10-CM | POA: Diagnosis present

## 2020-11-24 DIAGNOSIS — Z20822 Contact with and (suspected) exposure to covid-19: Secondary | ICD-10-CM | POA: Diagnosis present

## 2020-11-24 LAB — RESP PANEL BY RT-PCR (FLU A&B, COVID) ARPGX2
Influenza A by PCR: NEGATIVE
Influenza B by PCR: NEGATIVE
SARS Coronavirus 2 by RT PCR: NEGATIVE

## 2020-11-24 LAB — HEMOGLOBIN A1C
Hgb A1c MFr Bld: 5.3 % (ref 4.8–5.6)
Mean Plasma Glucose: 105.41 mg/dL

## 2020-11-24 LAB — CBG MONITORING, ED: Glucose-Capillary: 116 mg/dL — ABNORMAL HIGH (ref 70–99)

## 2020-11-24 LAB — GLUCOSE, CAPILLARY: Glucose-Capillary: 202 mg/dL — ABNORMAL HIGH (ref 70–99)

## 2020-11-24 IMAGING — CR DG HIP (WITH OR WITHOUT PELVIS) 2-3V*L*
1 series · 3 of 3 positions shown · non-contrast
Comparison: None.

CLINICAL DATA: Fall, pain

EXAM:
DG HIP (WITH OR WITHOUT PELVIS) 2-3V LEFT

[Series 1: dg hip unilat w or w/o pelvis 2-3 views  · non-contrast · 0.14mm/px · 3 of 3 slices shown]
[im 1/3]
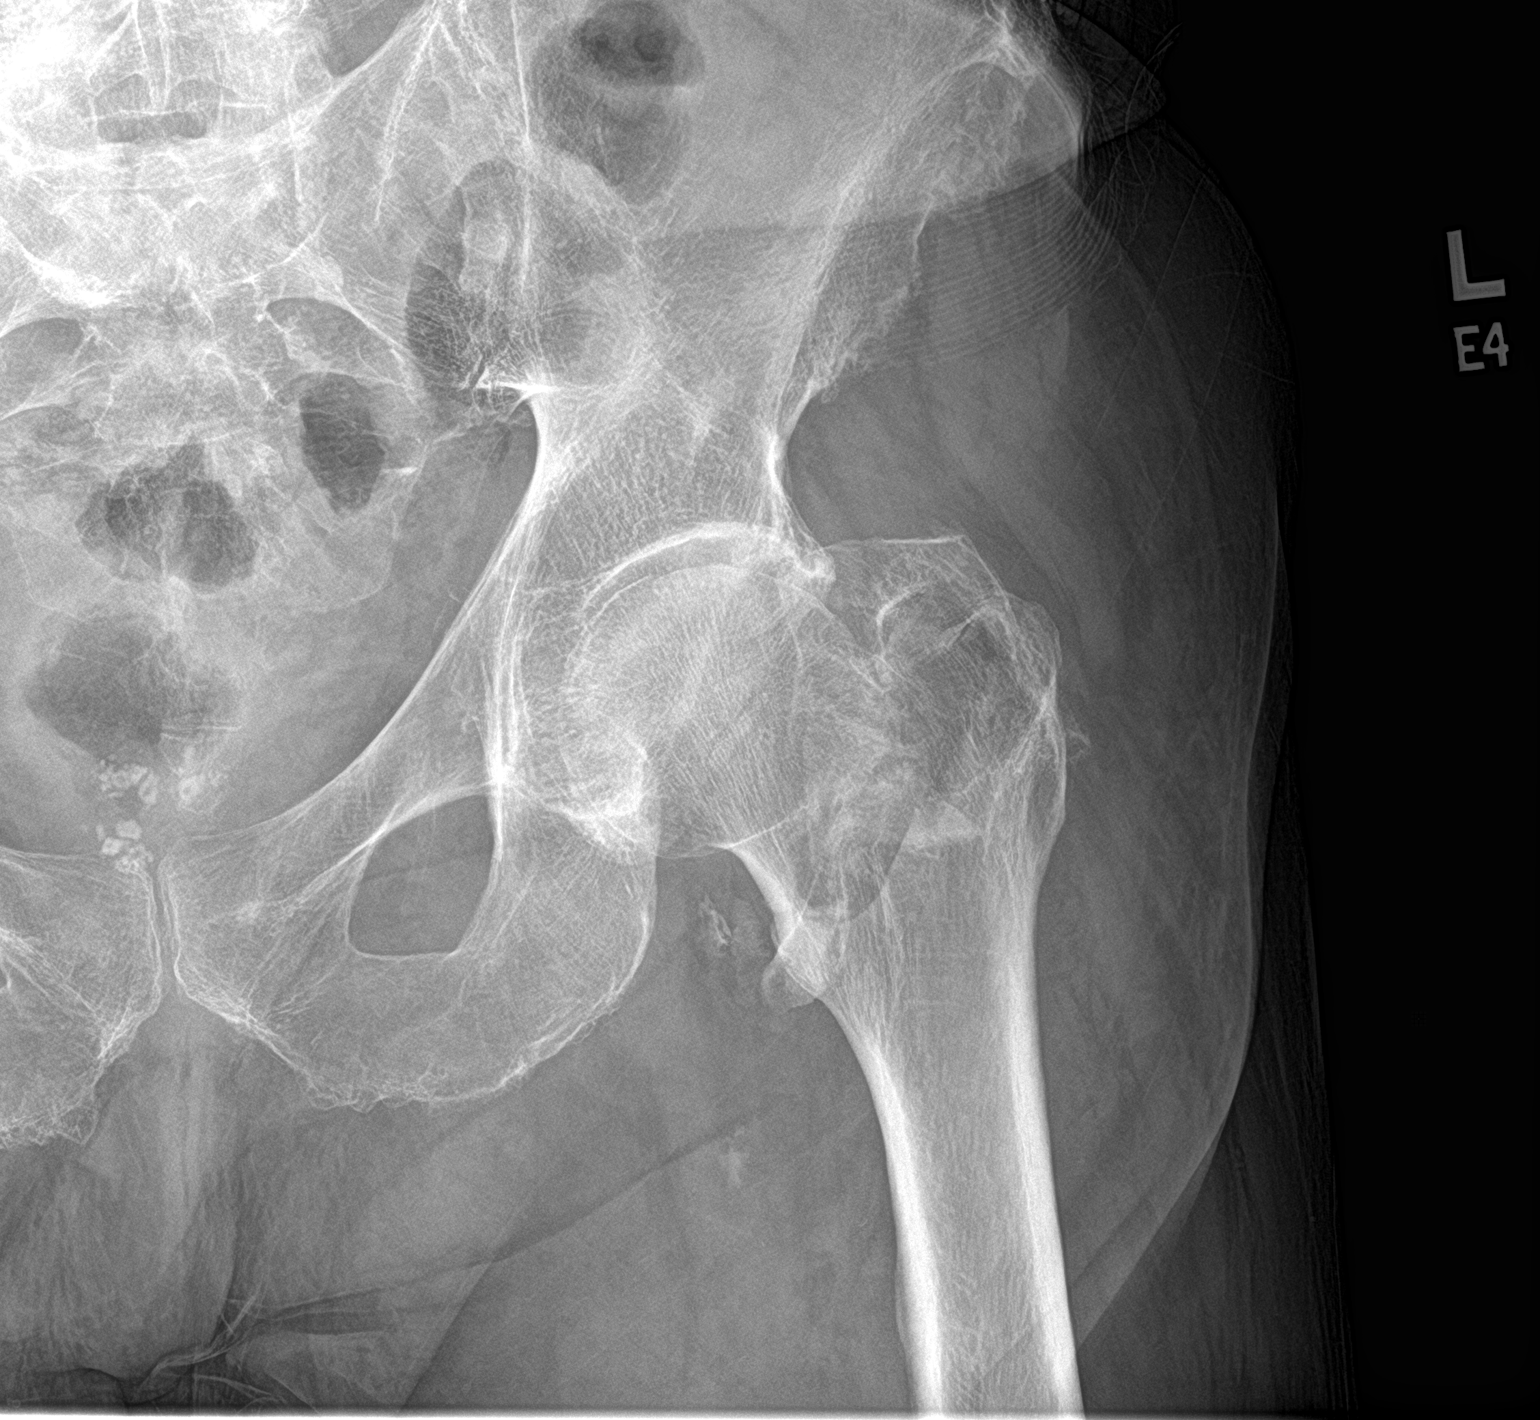
[im 2/3]
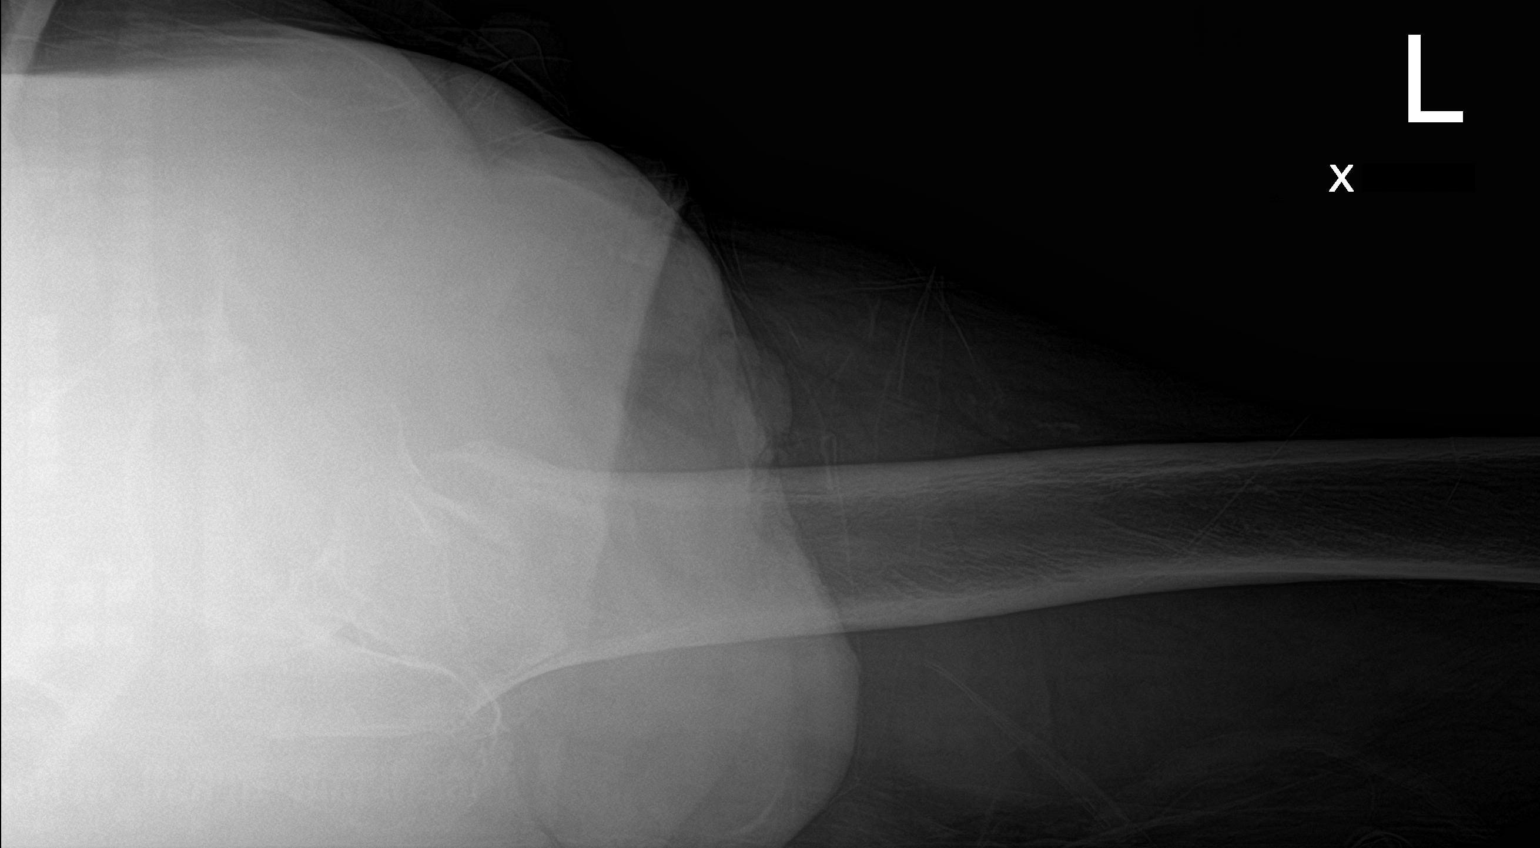
[im 3/3]
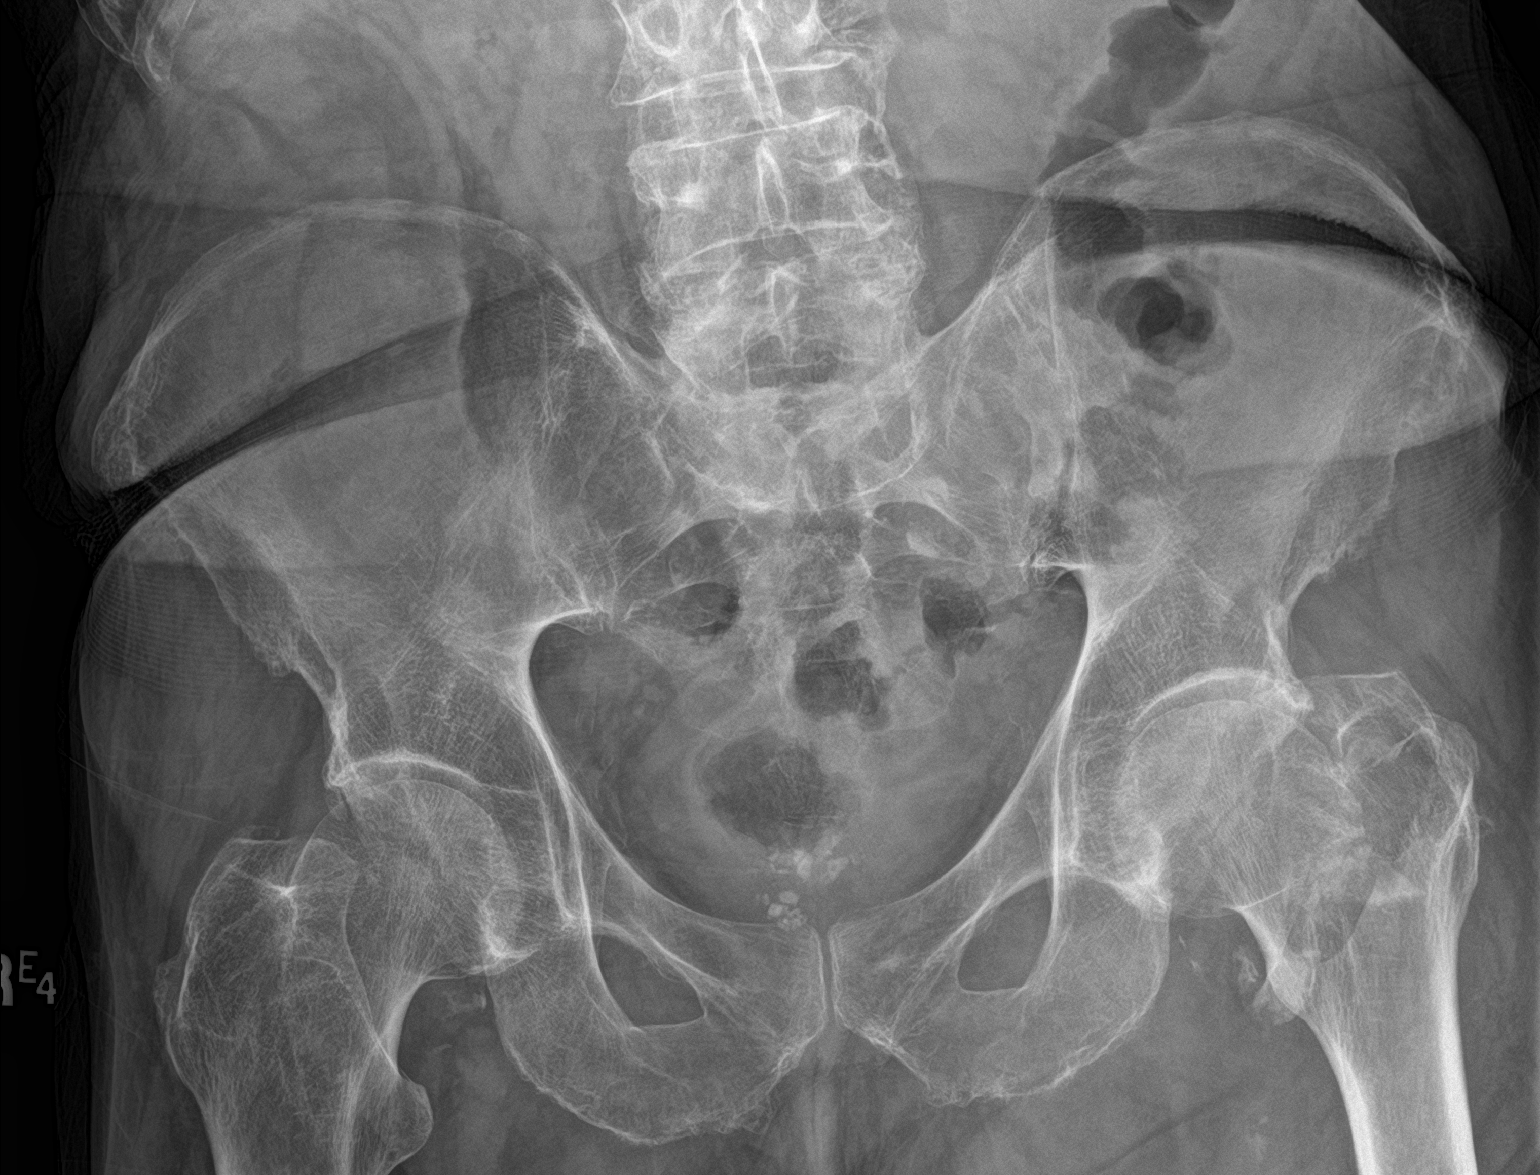

[3 of 3 positions shown; findings below may reference images not displayed]

FINDINGS: Displaced, impacted intertrochanteric fractures of the proximal left
femur. No other displaced fracture or dislocation of the pelvis or
proximal right femur seen in frontal view only.
IMPRESSION: Displaced, impacted intertrochanteric fractures of the proximal left
femur.

## 2020-11-24 MED ORDER — METOPROLOL SUCCINATE ER 25 MG PO TB24
25.0000 mg | ORAL_TABLET | Freq: Every day | ORAL | Status: DC
  Administered 2020-11-24 – 2020-12-18 (×22): 25 mg via ORAL
  Filled 2020-11-24 (×25): qty 1

## 2020-11-24 MED ORDER — ADULT MULTIVITAMIN W/MINERALS CH
1.0000 | ORAL_TABLET | Freq: Every day | ORAL | Status: DC
  Administered 2020-11-24 – 2020-12-27 (×34): 1 via ORAL
  Filled 2020-11-24 (×34): qty 1

## 2020-11-24 MED ORDER — THIAMINE HCL 100 MG PO TABS
100.0000 mg | ORAL_TABLET | Freq: Every day | ORAL | Status: DC
  Administered 2020-11-24 – 2020-12-27 (×34): 100 mg via ORAL
  Filled 2020-11-24 (×34): qty 1

## 2020-11-24 MED ORDER — PROBIOTIC 250 MG PO CAPS: 1.0000 | ORAL_CAPSULE | Freq: Two times a day (BID) | ORAL | 0 refills | Status: DC

## 2020-11-24 MED ORDER — TRANEXAMIC ACID-NACL 1000-0.7 MG/100ML-% IV SOLN
1000.0000 mg | Freq: Two times a day (BID) | INTRAVENOUS | Status: DC
  Administered 2020-11-24 – 2020-11-25 (×3): 1000 mg via INTRAVENOUS
  Filled 2020-11-24 (×6): qty 100

## 2020-11-24 MED ORDER — LISINOPRIL 5 MG PO TABS
5.0000 mg | ORAL_TABLET | Freq: Every day | ORAL | Status: DC
  Administered 2020-11-24: 5 mg via ORAL
  Filled 2020-11-24 (×2): qty 1

## 2020-11-24 MED ORDER — CEPHALEXIN 500 MG PO CAPS: 500.0000 mg | ORAL_CAPSULE | Freq: Three times a day (TID) | ORAL | 0 refills | Status: DC

## 2020-11-24 MED ORDER — LORAZEPAM 2 MG/ML IJ SOLN: 0.0000 mg | Freq: Four times a day (QID) | INTRAMUSCULAR | Status: AC

## 2020-11-24 MED ORDER — SENNA 8.6 MG PO TABS: 1.0000 | ORAL_TABLET | Freq: Two times a day (BID) | ORAL | Status: DC

## 2020-11-24 MED ORDER — LORAZEPAM 2 MG/ML IJ SOLN: 0.0000 mg | Freq: Two times a day (BID) | INTRAMUSCULAR | Status: AC

## 2020-11-24 MED ORDER — INSULIN ASPART 100 UNIT/ML IJ SOLN
0.0000 [IU] | Freq: Three times a day (TID) | INTRAMUSCULAR | Status: DC
  Administered 2020-11-25 – 2020-11-26 (×2): 2 [IU] via SUBCUTANEOUS
  Administered 2020-11-26: 3 [IU] via SUBCUTANEOUS
  Administered 2020-11-28 – 2020-11-29 (×2): 2 [IU] via SUBCUTANEOUS
  Filled 2020-11-24 (×5): qty 1

## 2020-11-24 MED ORDER — LORAZEPAM 2 MG PO TABS
0.0000 mg | ORAL_TABLET | Freq: Four times a day (QID) | ORAL | Status: AC
  Administered 2020-11-24: 1 mg via ORAL
  Filled 2020-11-24: qty 1

## 2020-11-24 MED ORDER — CEPHALEXIN 500 MG PO CAPS
500.0000 mg | ORAL_CAPSULE | Freq: Once | ORAL | Status: AC
  Administered 2020-11-24: 500 mg via ORAL
  Filled 2020-11-24: qty 1

## 2020-11-24 MED ORDER — CEFAZOLIN SODIUM-DEXTROSE 2-4 GM/100ML-% IV SOLN
2.0000 g | INTRAVENOUS | Status: AC
  Administered 2020-11-25: 2 g via INTRAVENOUS
  Filled 2020-11-24 (×2): qty 100

## 2020-11-24 MED ORDER — HYDROCODONE-ACETAMINOPHEN 5-325 MG PO TABS
1.0000 | ORAL_TABLET | Freq: Four times a day (QID) | ORAL | Status: DC | PRN
  Administered 2020-11-24 – 2020-11-27 (×4): 1 via ORAL
  Filled 2020-11-24 (×4): qty 1

## 2020-11-24 MED ORDER — FLUOXETINE HCL 20 MG PO CAPS
20.0000 mg | ORAL_CAPSULE | Freq: Every day | ORAL | Status: DC
  Administered 2020-11-25 – 2020-12-27 (×33): 20 mg via ORAL
  Filled 2020-11-24 (×35): qty 1

## 2020-11-24 MED ORDER — APIXABAN 5 MG PO TABS
5.0000 mg | ORAL_TABLET | Freq: Two times a day (BID) | ORAL | Status: DC
  Administered 2020-11-24: 5 mg via ORAL
  Filled 2020-11-24: qty 1

## 2020-11-24 MED ORDER — SODIUM CHLORIDE 0.9 % IV SOLN
1.0000 g | INTRAVENOUS | Status: DC
  Administered 2020-11-24 – 2020-11-26 (×2): 1 g via INTRAVENOUS
  Filled 2020-11-24 (×3): qty 10

## 2020-11-24 MED ORDER — LORAZEPAM 2 MG PO TABS: 0.0000 mg | ORAL_TABLET | Freq: Two times a day (BID) | ORAL | Status: AC

## 2020-11-24 MED ORDER — METHOCARBAMOL 500 MG PO TABS
500.0000 mg | ORAL_TABLET | Freq: Four times a day (QID) | ORAL | Status: DC | PRN
  Administered 2020-11-26 – 2020-12-05 (×3): 500 mg via ORAL
  Filled 2020-11-24 (×4): qty 1

## 2020-11-24 MED ORDER — DEXTROSE 5 % IV SOLN
500.0000 mg | Freq: Four times a day (QID) | INTRAVENOUS | Status: DC | PRN
  Filled 2020-11-24: qty 5

## 2020-11-24 MED ORDER — FOLIC ACID 1 MG PO TABS
1.0000 mg | ORAL_TABLET | Freq: Every day | ORAL | Status: DC
  Administered 2020-11-24 – 2020-12-27 (×34): 1 mg via ORAL
  Filled 2020-11-24 (×34): qty 1

## 2020-11-24 MED ORDER — MORPHINE SULFATE (PF) 2 MG/ML IV SOLN
0.5000 mg | INTRAVENOUS | Status: DC | PRN
  Filled 2020-11-24: qty 1

## 2020-11-24 NOTE — ED Notes (Signed)
Pt's wife called with concerns of issues regarding pt's femur and hip. Pt c/o left knee/thigh pain. Pt denies pain in either hip. MD made aware. Orders placed for further imaging.

## 2020-11-24 NOTE — ED Notes (Signed)
Pt had large runny bowel movement. This RN found pt with BM on front, back, and clothing.  Pt had BM on hands, cell phone, and bed rails.  Pt had not called out and was found by IV team upon entering room. I asked if he knew he had a bowel movement and he said yes but had not hit call button for help.  Pt needed  total care assistance in cleaning and changing brief, clothing, and linens which this RN performed.

## 2020-11-24 NOTE — ED Notes (Signed)
Changed into behavioral scrubs. Clothing already in paper bag pt brought. Placed cell phone, charger and iwatch into brown bag and labeled with hospital bag. Pt kept dentures at bedside.

## 2020-11-24 NOTE — ED Notes (Signed)
Assumed care of patient.

## 2020-11-24 NOTE — H&P (Signed)
History and Physical    James Sanford FXJ:[id number] DOB: 1955/12/26 DOA: 11/23/2020  PCP: Jerl Mina, MD   Patient coming from: Home  I have personally briefly reviewed patient's old medical records in [facility]  Chief Complaint: Fall Most of the history was obtained from patient's wife who was at the bedside HPI: James Sanford is a 65 y.o. male with medical history significant for alcohol abuse, diabetes mellitus, hypertension, sleep apnea, paroxysmal atrial fibrillation on anticoagulation who presents to the ER via EMS for evaluation of a fall. His wife states he had about 6 falls 2 days prior to his hospitalization while he was intoxicated. He presented to the ER with complaints of left knee pain but denied having any head injury. Patient's wife was at the bedside states that patient has been drinking a lot more recently and is very depressed.  He has suicidal ideations with plans to kill himself with a handgun.  She states that patient's father also committed suicide.  She is concerned that he has become increasingly violent and is worried about her safety. Patient was committed in the ER and was seen by psychiatry, his IVC has been discontinued but patient will need discharge to a Geri psych area for management of severe depression once stabilized. Patient had an x-ray of the left hip which shows a displaced, impacted intertrochanteric fracture of the left femur. Wife states that because of his frequent falls while intoxicated he has had a fracture of his hand and of his leg.  She is requesting that patient get help for his substance dependence. 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 He denies having any chest pain, no shortness of breath, no nausea, no vomiting, no dizziness, no lightheadedness, no palpitations, no diaphoresis, no urinary frequency, no  nocturia, no dysuria. Labs show sodium 136, potassium 4.7, chloride 101, bicarb 26, glucose 155, BUN 14, creatinine 0.74, calcium 8.1, white count 6.6, hemoglobin 13, hematocrit 38.2, MCV 98.7, RDW 17.2, platelet count 148 Respiratory viral panel is negative Left knee x-ray shows left total knee arthroplasty.  No acute bony abnormalities. CT scan of the head without contrast shows no acute intracranial pathology.  Mild chronic microvascular ischemic changes. Left hip x-ray shows displaced, impacted intertrochanteric fracture of the proximal left femur. Twelve-lead EKG reviewed by me shows atrial fibrillation with a left bundle branch block.   ED Course: Patient is a 65 year old male with a history of alcohol abuse who presents to the ER via EMS for evaluation of left knee pain and difficulty with ambulation following multiple falls while intoxicated. The patient's wife states that he had become increasingly depressed with suicidal ideations and has a plan to use a handgun. Patient was involuntary committed in the ER and was seen by psychiatry who discontinued the IVC. He has a displaced, impacted intertrochanteric fracture of the proximal left femur and will be admitted to the hospital for further evaluation   Review of Systems: As per HPI otherwise all other systems reviewed and negative.    Past Medical History:  Diagnosis Date   Arthritis    Diabetes (HCC)    Hyperlipemia    Hypertension    Sleep apnea     Past Surgical History:  Procedure Laterality Date   COLONOSCOPY WITH PROPOFOL N/A 01/28/2020   Procedure: COLONOSCOPY WITH PROPOFOL;  Surgeon: Toney Reil, MD;  Location: [facility] ENDOSCOPY;  Service: Gastroenterology;  Laterality:  N/A;   COLONOSCOPY WITH PROPOFOL N/A 01/28/2020   Procedure: COLONOSCOPY WITH PROPOFOL;  Surgeon: Toney Reil, MD;  Location: [facility] ENDOSCOPY;  Service: Gastroenterology;  Laterality: N/A;   ESOPHAGOGASTRODUODENOSCOPY (EGD) WITH PROPOFOL  N/A 01/28/2020   Procedure: ESOPHAGOGASTRODUODENOSCOPY (EGD) WITH PROPOFOL;  Surgeon: Toney Reil, MD;  Location: [facility] ENDOSCOPY;  Service: Gastroenterology;  Laterality: N/A;   GASTRIC BYPASS     HERNIA REPAIR     JOINT REPLACEMENT Bilateral    Knee surgery   OPEN REDUCTION INTERNAL FIXATION (ORIF) DISTAL RADIAL FRACTURE Right 10/30/2017   Procedure: OPEN REDUCTION INTERNAL FIXATION (ORIF) DISTAL RADIAL FRACTURE;  Surgeon: Garnette Gunner, MD;  Location: [facility] ORS;  Service: Orthopedics;  Laterality: Right;   ORIF FEMUR FRACTURE Right 01/29/2020   Procedure: OPEN REDUCTION INTERNAL FIXATION (ORIF) DISTAL FEMUR FRACTURE;  Surgeon: Christena Flake, MD;  Location: [facility] ORS;  Service: Orthopedics;  Laterality: Right;     reports that he has never smoked. He has quit using smokeless tobacco.  His smokeless tobacco use included chew. He reports current alcohol use. He reports that he does not currently use drugs.  Allergies  Allergen Reactions   Penicillins Diarrhea    Unknown childhood reaction    Family History  Problem Relation Age of Onset   Stroke Mother    Suicidality Father       Prior to Admission medications   Medication Sig Start Date End Date Taking? Authorizing Provider  cephALEXin (KEFLEX) 500 MG capsule Take 1 capsule (500 mg total) by mouth 3 (three) times daily for 7 days. 11/24/20 12/01/20 Yes Willy Eddy, MD  ELIQUIS 5 MG TABS tablet Take 5 mg by mouth 2 (two) times daily. 01/05/20  Yes [provider]  metoprolol succinate (TOPROL-XL) 50 MG 24 hr tablet Take 0.5 tablets (25 mg total) by mouth daily. 02/06/20  Yes Enedina Finner, MD  Saccharomyces boulardii (PROBIOTIC) 250 MG CAPS Take 1 capsule by mouth in the morning and at bedtime. 11/24/20  Yes Willy Eddy, MD    Physical Exam: Vitals:   11/23/20 2120 11/24/20 0427 11/24/20 0926 11/24/20 1441  BP: 129/90 (!) 132/91 (!) 143/91 (!) 125/113  Pulse: 80 75 60 70  Resp: 18 16 18    Temp:       TempSrc:      SpO2: 100% 100% 98%   Weight:      Height:         Vitals:   11/23/20 2120 11/24/20 0427 11/24/20 0926 11/24/20 1441  BP: 129/90 (!) 132/91 (!) 143/91 (!) 125/113  Pulse: 80 75 60 70  Resp: 18 16 18    Temp:      TempSrc:      SpO2: 100% 100% 98%   Weight:      Height:          Constitutional: Alert and oriented x 3 . Not in any apparent distress HEENT:      Head: Normocephalic and atraumatic.         Eyes: PERLA, EOMI, Conjunctivae are normal. Sclera is non-icteric.       Mouth/Throat: Mucous membranes are moist.       Neck: Supple with no signs of meningismus. Cardiovascular: Irregularly irregular. No murmurs, gallops, or rubs. 2+ symmetrical distal pulses are present . No JVD. No LE edema Respiratory: Respiratory effort normal .Lungs sounds clear bilaterally. No wheezes, crackles, or rhonchi.  Gastrointestinal: Soft, non tender, and non distended with positive bowel sounds.  Genitourinary: No CVA tenderness. Musculoskeletal: Decreased range  of motion left hip, shortening of left lower extremity. No cyanosis, or erythema of extremities. Neurologic:  Face is symmetric. Moving all extremities. No gross focal neurologic deficits . Skin: Skin is warm, dry.  No rash or ulcers Psychiatric: Depressed mood and flat affect    Labs on Admission: I have personally reviewed following labs and imaging studies  CBC: Recent Labs  Lab 11/23/20 1440  WBC 6.6  HGB 13.0  HCT 38.2*  MCV 98.7  PLT 148*   Basic Metabolic Panel: Recent Labs  Lab 11/23/20 1440  NA 136  K 4.7  CL 101  CO2 26  GLUCOSE 155*  BUN 14  CREATININE 0.74  CALCIUM 8.1*   GFR: Estimated Creatinine Clearance: 103.1 mL/min (by C-G formula based on SCr of 0.74 mg/dL). Liver Function Tests: No results for input(s): AST, ALT, ALKPHOS, BILITOT, PROT, ALBUMIN in the last 168 hours. No results for input(s): LIPASE, AMYLASE in the last 168 hours. No results for input(s): AMMONIA in the last  168 hours. Coagulation Profile: No results for input(s): INR, PROTIME in the last 168 hours. Cardiac Enzymes: No results for input(s): CKTOTAL, CKMB, CKMBINDEX, TROPONINI in the last 168 hours. BNP (last 3 results) No results for input(s): PROBNP in the last 8760 hours. HbA1C: No results for input(s): HGBA1C in the last 72 hours. CBG: No results for input(s): GLUCAP in the last 168 hours. Lipid Profile: No results for input(s): CHOL, HDL, LDLCALC, TRIG, CHOLHDL, LDLDIRECT in the last 72 hours. Thyroid Function Tests: No results for input(s): TSH, T4TOTAL, FREET4, T3FREE, THYROIDAB in the last 72 hours. Anemia Panel: No results for input(s): VITAMINB12, FOLATE, FERRITIN, TIBC, IRON, RETICCTPCT in the last 72 hours. Urine analysis:    Component Value Date/Time   COLORURINE YELLOW 11/23/2020 2130   APPEARANCEUR CLOUDY (A) 11/23/2020 2130   LABSPEC >1.030 (H) 11/23/2020 2130   PHURINE 5.0 11/23/2020 2130   GLUCOSEU NEGATIVE 11/23/2020 2130   HGBUR TRACE (A) 11/23/2020 2130   BILIRUBINUR SMALL (A) 11/23/2020 2130   KETONESUR NEGATIVE 11/23/2020 2130   PROTEINUR TRACE (A) 11/23/2020 2130   NITRITE POSITIVE (A) 11/23/2020 2130   LEUKOCYTESUR TRACE (A) 11/23/2020 2130    Radiological Exams on Admission: CT HEAD WO CONTRAST ( )  Result Date: 11/23/2020 CLINICAL DATA:  Altered mental status. EXAM: CT HEAD WITHOUT CONTRAST TECHNIQUE: Contiguous axial images were obtained from the base of the skull through the vertex without intravenous contrast. COMPARISON:  Head CT dated 02/21/2019. FINDINGS: Brain: Mild age-related atrophy and chronic microvascular ischemic changes. There is no acute intracranial hemorrhage. No mass effect or midline shift. No extra-axial fluid collection. Vascular: No hyperdense vessel or unexpected calcification. Skull: Normal. Negative for fracture or focal lesion. Sinuses/Orbits: No acute finding. Other: None IMPRESSION: 1. No acute intracranial pathology. 2. Mild  chronic microvascular ischemic changes. Electronically Signed   By: Elgie Collard M.D.   On: 11/23/2020 19:40   DG Knee Left Port  Result Date: 11/23/2020 CLINICAL DATA:  Left knee injury.  Pain.  Six falls last night. EXAM: PORTABLE LEFT KNEE - 1-2 VIEW COMPARISON:  10/18/2007 FINDINGS: Left total knee arthroplasty with patellar femoral component. Components appear well seated. No evidence of acute fracture or dislocation. No focal bone lesion or bone destruction. No significant effusions. Vascular calcifications in the soft tissues. IMPRESSION: Left total knee arthroplasty.  No acute bony abnormalities. Electronically Signed   By: Burman Nieves M.D.   On: 11/23/2020 18:46   DG Hip Unilat W or Wo Pelvis 2-3 Views  Left  Result Date: 11/24/2020 CLINICAL DATA:  Fall, pain EXAM: DG HIP (WITH OR WITHOUT PELVIS) 2-3V LEFT COMPARISON:  None. FINDINGS: Displaced, impacted intertrochanteric fractures of the proximal left femur. No other displaced fracture or dislocation of the pelvis or proximal right femur seen in frontal view only. IMPRESSION: Displaced, impacted intertrochanteric fractures of the proximal left femur. Electronically Signed   By: Lauralyn Primes M.D.   On: 11/24/2020 13:39     Assessment/Plan Principal Problem:   Intertrochanteric fracture of left femur, closed, initial encounter (HCC) Active Problems:   Alcohol abuse   Diabetes mellitus type 2, uncomplicated (HCC)   Hypertension   Moderate major depression, single episode (HCC)   Acute lower UTI      Intertrochanteric fracture of proximal left femur Following a fall Place patient on fall precautions Consult orthopedic surgery Pain control Muscle relaxants Immobilize left lower extremity    Severe depression with suicidal ideation Patient does not have any insight to his condition His wife has recordings of multiple threats that he has made and a plan to use a handgun. She feels unsafe with him at home and wants him  to get help. Patient has been seen by psychiatry who recommends an antidepressant but patient has refused to take the antidepressant. He is supposed to be on Lexapro at home but does not take it either Discussed with psychiatry and he does not need one-to-one precautions at this time but will need to be discharged to facility with [facility] psych for stabilization    UTI Treat patient empirically with Rocephin until urine culture results become available    History of atrial fibrillation Continue metoprolol for rate control Apixaban is on hold for planned procedure    Alcohol abuse Place patient on CIWA protocol and administer lorazepam for CIWA score of 8 or greater Place patient on folic acid, MVI and thiamine    Hypertension Continue lisinopril and metoprolol    Diabetes mellitus Place patient on consistent carbohydrate diet Glycemic control with sliding scale insulin  DVT prophylaxis: SCD Code Status: full code  Family Communication: Greater than 50% of time was spent discussing patient's condition and plan of care with him and his wife at the bedside.  All questions and concerns have been addressed.  They verbalized understanding and agree with the plan. Disposition Plan: Back to previous home environment Consults called: Psychiatry/orthopedic surgery Status: At the time of admission, it appears that the appropriate admission status for this patient is inpatient. This is judged to be reasonable and necessary in order to provide the required intensity of service to ensure the patient's safety given the presenting symptoms, physical exam findings, and initial radiographic and laboratory data in the context of their comorbid conditions. Patient requires inpatient status due to high intensity of service, high risk for further deterioration and high frequency of surveillance required.    Lucile Shutters MD [facility]     11/24/2020, 2:49 PM

## 2020-11-24 NOTE — ED Notes (Signed)
Pt given meal tray.

## 2020-11-24 NOTE — Consult Note (Signed)
[facility] Consult   Reason for Consult: Consult for 65 year old man without past history of known psychiatric treatment brought in under IVC filed by family because of drinking and poor self-care and concern about suicidal ideation Referring Physician: Roxan Hockeyobinson Patient Identification: James PaiRichard Patrick Grabill MRN:  [id number] Principal Diagnosis: Moderate major depression, single episode (HCC) Diagnosis:  Principal Problem:   Moderate major depression, single episode (HCC) Active Problems:   Alcohol abuse   Total Time spent with patient: 1 hour  Subjective:   James Sanford is a 65 y.o. male patient admitted with "there is nothing wrong with me except I fell".  HPI: Patient seen chart reviewed.  Also spoke with his wife on the telephone.  65 year old man brought to the emergency room under IVC in which family reports that he has been drinking excessively and that when he is intoxicated he has made suicidal threats and has not been able to care for himself.  On the phone patient's wife describes multiple symptoms of depression including a change in mood and activity levels, irritability and anger that is uncharacteristic, change in sleeping habits and reports that he has been drinking excessively on a daily basis hiding it from the family.  He reports he has made suicidal threats especially while intoxicated.  Wife has had firearms at home.  Family has tried to convince patient to get help.  Primary care doctor has tried to start him on medicine but the patient has refused.  On interview the patient is very passive in his cooperation.  Denies that he drinks alcohol at all.  Denies that his mood is been any different than usual.  Denies feeling depressed.  Denies having ever made any suicidal statements.  Says his only problem is that he fell down but that he cannot give any description of why or for what reason he might have fallen.  Past Psychiatric History: Family has  identified the alcohol use and the mood changes for at least weeks if not months but the patient has resisted attempts to get him engaged in treatment.  No known past suicide attempts or hospitalization.  No known withdrawal symptoms  Risk to Self:   Risk to Others:   Prior Inpatient Therapy:   Prior Outpatient Therapy:    Past Medical History:  Past Medical History:  Diagnosis Date   Arthritis    Diabetes (HCC)    Hyperlipemia    Hypertension    Sleep apnea     Past Surgical History:  Procedure Laterality Date   COLONOSCOPY WITH PROPOFOL N/A 01/28/2020   Procedure: COLONOSCOPY WITH PROPOFOL;  Surgeon: Toney ReilVanga, Rohini Reddy, MD;  Location: [facility] ENDOSCOPY;  Service: Gastroenterology;  Laterality: N/A;   COLONOSCOPY WITH PROPOFOL N/A 01/28/2020   Procedure: COLONOSCOPY WITH PROPOFOL;  Surgeon: Toney ReilVanga, Rohini Reddy, MD;  [facility];  Service: Gastroenterology;  Laterality: N/A;   ESOPHAGOGASTRODUODENOSCOPY (EGD) WITH PROPOFOL N/A 01/28/2020   Procedure: ESOPHAGOGASTRODUODENOSCOPY (EGD) WITH PROPOFOL;  Surgeon: Toney ReilVanga, Rohini Reddy, MD;  [facility];  Service: Gastroenterology;  Laterality: N/A;   GASTRIC BYPASS     HERNIA REPAIR     JOINT REPLACEMENT Bilateral    Knee surgery   OPEN REDUCTION INTERNAL FIXATION (ORIF) DISTAL RADIAL FRACTURE Right 10/30/2017   Procedure: OPEN REDUCTION INTERNAL FIXATION (ORIF) DISTAL RADIAL FRACTURE;  Surgeon: Garnette Gunnerurrani, Shakeel, MD;  Location: [facility] ORS;  Service: Orthopedics;  Laterality: Right;   ORIF FEMUR FRACTURE Right 01/29/2020   Procedure: OPEN REDUCTION INTERNAL FIXATION (ORIF) DISTAL FEMUR FRACTURE;  Surgeon: Christena Flake, MD;  Location: [facility] ORS;  Service: Orthopedics;  Laterality: Right;   Family History:  Family History  Problem Relation Age of Onset   Stroke Mother    Suicidality Father    Family Psychiatric  History: It is reported that father killed himself Social History:  Social History   Substance and  Sexual Activity  Alcohol Use Yes   Comment: beer occasionally     Social History   Substance and Sexual Activity  Drug Use Not Currently    Social History   Socioeconomic History   Marital status: Married    Spouse name: Not on file   Number of children: Not on file   Years of education: Not on file   Highest education level: Not on file  Occupational History   Not on file  Tobacco Use   Smoking status: Never   Smokeless tobacco: Former    Types: Chew   Tobacco comments:    quir about 4 years ago  Vaping Use   Vaping Use: Never used  Substance and Sexual Activity   Alcohol use: Yes    Comment: beer occasionally   Drug use: Not Currently   Sexual activity: Not on file  Other Topics Concern   Not on file  Social History Narrative   Lives at home with his wife. Independent baseline   Social Determinants of Corporate investment banker Strain: Not on file  Food Insecurity: Not on file  Transportation Needs: Not on file  Physical Activity: Not on file  Stress: Not on file  Social Connections: Not on file   Additional Social History:    Allergies:   Allergies  Allergen Reactions   Penicillins Diarrhea    Unknown childhood reaction    Labs:  Results for orders placed or performed during the hospital encounter of 11/23/20 (from the past 48 hour(s))  Basic metabolic panel     Status: Abnormal   Collection Time: 11/23/20  2:40 PM  Result Value Ref Range   Sodium 136 135 - 145 mmol/L   Potassium 4.7 3.5 - 5.1 mmol/L   Chloride 101 98 - 111 mmol/L   CO2 26 22 - 32 mmol/L   Glucose, Bld 155 (H) 70 - 99 mg/dL    Comment: Glucose reference range applies only to samples taken after fasting for at least 8 hours.   BUN 14 8 - 23 mg/dL   Creatinine, Ser 9.67 0.61 - 1.24 mg/dL   Calcium 8.1 (L) 8.9 - 10.3 mg/dL   GFR, Estimated >89 >38 mL/min    Comment: (NOTE) Calculated using the CKD-EPI Creatinine Equation (2021)    Anion gap 9 5 - 15    Comment: Performed at  [facility], [address] [place], [place] [address]  CBC     Status: Abnormal   Collection Time: 11/23/20  2:40 PM  Result Value Ref Range   WBC 6.6 4.0 - 10.5 K/uL   RBC 3.87 (L) 4.22 - 5.81 MIL/uL   Hemoglobin 13.0 13.0 - 17.0 g/dL   HCT 10.2 (L) 58.5 - 27.7 %   MCV 98.7 80.0 - 100.0 fL   MCH 33.6 26.0 - 34.0 pg   MCHC 34.0 30.0 - 36.0 g/dL   RDW 82.4 (H) 23.5 - 36.1 %   Platelets 148 (L) 150 - 400 K/uL   nRBC 0.0 0.0 - 0.2 %    Comment: Performed at [facility], [address], [place], [place] [address]  Urinalysis, Complete w Microscopic Urine, Clean Catch     Status: Abnormal   Collection Time: 11/23/20  9:30 PM  Result Value Ref Range   Color, Urine YELLOW YELLOW   APPearance CLOUDY (A) CLEAR   Specific Gravity, Urine >1.030 (H) 1.005 - 1.030   pH 5.0 5.0 - 8.0   Glucose, UA NEGATIVE NEGATIVE mg/dL   Hgb urine dipstick TRACE (A) NEGATIVE   Bilirubin Urine SMALL (A) NEGATIVE   Ketones, ur NEGATIVE NEGATIVE mg/dL   Protein, ur TRACE (A) NEGATIVE mg/dL   Nitrite POSITIVE (A) NEGATIVE   Leukocytes,Ua TRACE (A) NEGATIVE   RBC / HPF 6-10 0 - 5 RBC/hpf   WBC, UA >50 (H) 0 - 5 WBC/hpf   Bacteria, UA MANY (A) NONE SEEN   Squamous Epithelial / LPF 0-5 0 - 5   Mucus PRESENT    Hyaline Casts, UA PRESENT     Comment: Performed at [facility], [address] [place], [place] [address]  Resp Panel by RT-PCR (Flu A&B, Covid) Nasopharyngeal Swab     Status: None   Collection Time: 11/24/20  4:21 AM   Specimen: Nasopharyngeal Swab; Nasopharyngeal(NP) swabs in vial transport medium  Result Value Ref Range   SARS Coronavirus 2 by RT PCR NEGATIVE NEGATIVE    Comment: (NOTE) SARS-CoV-2 target nucleic acids are NOT DETECTED.  The SARS-CoV-2 RNA is generally detectable in upper respiratory specimens during the acute phase of infection. The lowest concentration of SARS-CoV-2 viral copies this assay can detect is 138 copies/mL. A negative result does  not preclude SARS-Cov-2 infection and should not be used as the sole basis for treatment or other patient management decisions. A negative result may occur with  improper specimen collection/handling, submission of specimen other than nasopharyngeal swab, presence of viral mutation(s) within the areas targeted by this assay, and inadequate number of viral copies(<138 copies/mL). A negative result must be combined with clinical observations, patient history, and epidemiological information. The expected result is Negative.  Fact Sheet for Patients:  BloggerCourse.com  Fact Sheet for Healthcare Providers:  SeriousBroker.it  This test is no t yet approved or cleared by the [place] FDA and  has been authorized for detection and/or diagnosis of SARS-CoV-2 by FDA under an Emergency Use Authorization (EUA). This EUA will remain  in effect (meaning this test can be used) for the duration of the COVID-19 declaration under Section 564(b)(1) of the Act, 21 U.S.C.section 360bbb-3(b)(1), unless the authorization is terminated  or revoked sooner.       Influenza A by PCR NEGATIVE NEGATIVE   Influenza B by PCR NEGATIVE NEGATIVE    Comment: (NOTE) The Xpert Xpress SARS-CoV-2/FLU/RSV plus assay is intended as an aid in the diagnosis of influenza from Nasopharyngeal swab specimens and should not be used as a sole basis for treatment. Nasal washings and aspirates are unacceptable for Xpert Xpress SARS-CoV-2/FLU/RSV testing.  Fact Sheet for Patients: BloggerCourse.com  Fact Sheet for Healthcare Providers: SeriousBroker.it  This test is not yet approved or cleared by the [place] FDA and has been authorized for detection and/or diagnosis of SARS-CoV-2 by FDA under an Emergency Use Authorization (EUA). This EUA will remain in effect (meaning this test can be used) for the duration of  the COVID-19 declaration under Section 564(b)(1) of the Act, 21 U.S.C. section 360bbb-3(b)(1), unless the authorization is terminated or revoked.  Performed at [facility], [address] [place], [place] [address]     Current Facility-Administered Medications  Medication  Dose Route Frequency Provider Last Rate Last Admin   apixaban (ELIQUIS) tablet 5 mg  5 mg Oral BID Ward, Kristen N, DO   5 mg at 11/24/20 1033   FLUoxetine (PROZAC) capsule 20 mg  20 mg Oral Daily Mcarthur Ivins, Jackquline Denmark, MD       folic acid (FOLVITE) tablet 1 mg  1 mg Oral Daily Ward, Kristen N, DO   1 mg at 11/24/20 7026   lisinopril (ZESTRIL) tablet 5 mg  5 mg Oral Daily Ward, Kristen N, DO   5 mg at 11/24/20 3785   LORazepam (ATIVAN) injection 0-4 mg  0-4 mg Intravenous Q6H Ward, Kristen N, DO       Or   LORazepam (ATIVAN) tablet 0-4 mg  0-4 mg Oral Q6H Ward, Kristen N, DO       [START ON 11/26/2020] LORazepam (ATIVAN) injection 0-4 mg  0-4 mg Intravenous Q12H Ward, Kristen N, DO       Or   [START ON 11/26/2020] LORazepam (ATIVAN) tablet 0-4 mg  0-4 mg Oral Q12H Ward, Kristen N, DO       metoprolol succinate (TOPROL-XL) 24 hr tablet 25 mg  25 mg Oral Daily Ward, Kristen N, DO   25 mg at 11/24/20 8850   multivitamin with minerals tablet 1 tablet  1 tablet Oral Daily Ward, Kristen N, DO   1 tablet at 11/24/20 2774   thiamine tablet 100 mg  100 mg Oral Daily Ward, Kristen N, DO   100 mg at 11/24/20 1287   Current Outpatient Medications  Medication Sig Dispense Refill   ELIQUIS 5 MG TABS tablet Take 5 mg by mouth 2 (two) times daily.     metoprolol succinate (TOPROL-XL) 50 MG 24 hr tablet Take 0.5 tablets (25 mg total) by mouth daily. 30 tablet 0    Musculoskeletal: Strength & Muscle Tone: within normal limits Gait & Station: normal Patient leans: N/A            Psychiatric Specialty Exam:  Presentation  General Appearance:  No data recorded Eye Contact: No data recorded Speech: No data  recorded Speech Volume: No data recorded Handedness: No data recorded  Mood and Affect  Mood: No data recorded Affect: No data recorded  Thought Process  Thought Processes: No data recorded Descriptions of Associations:No data recorded Orientation:No data recorded Thought Content:No data recorded History of Schizophrenia/Schizoaffective disorder:No  Duration of Psychotic Symptoms:No data recorded Hallucinations:No data recorded Ideas of Reference:No data recorded Suicidal Thoughts:No data recorded Homicidal Thoughts:No data recorded  Sensorium  Memory: No data recorded Judgment: No data recorded Insight: No data recorded  Executive Functions  Concentration: No data recorded Attention Span: No data recorded Recall: No data recorded Fund of Knowledge: No data recorded Language: No data recorded  Psychomotor Activity  Psychomotor Activity: No data recorded  Assets  Assets: No data recorded  Sleep  Sleep: No data recorded  Physical Exam: Physical Exam Vitals and nursing note reviewed.  Constitutional:      Appearance: Normal appearance.  HENT:     Head: Normocephalic and atraumatic.     Mouth/Throat:     Pharynx: Oropharynx is clear.  Eyes:     Pupils: Pupils are equal, round, and reactive to light.  Cardiovascular:     Rate and Rhythm: Normal rate and regular rhythm.  Pulmonary:     Effort: Pulmonary effort is normal.     Breath sounds: Normal breath sounds.  Abdominal:     General: Abdomen is flat.  Palpations: Abdomen is soft.  Musculoskeletal:        General: Signs of injury present.  Skin:    General: Skin is warm and dry.  Neurological:     General: No focal deficit present.     Mental Status: He is alert. Mental status is at baseline.  Psychiatric:        Attention and Perception: He is inattentive.        Mood and Affect: Mood normal. Affect is blunt.        Speech: He is noncommunicative.        Behavior: Behavior is  slowed.        Thought Content: Thought content normal. Thought content is not paranoid. Thought content does not include homicidal or suicidal ideation.        Cognition and Memory: Cognition is impaired. Memory is impaired.   Review of Systems  Constitutional: Negative.   HENT: Negative.    Eyes: Negative.   Respiratory: Negative.    Cardiovascular: Negative.   Gastrointestinal: Negative.   Musculoskeletal:  Positive for joint pain.  Skin: Negative.   Neurological: Negative.   Psychiatric/Behavioral: Negative.    Blood pressure (!) 143/91, pulse 60, temperature 99.4 F (37.4 C), temperature source Oral, resp. rate 18, height 6' (1.829 m), weight 77.1 kg, SpO2 98 %. Body mass index is 23.06 kg/m.  Treatment Plan Summary: Medication management and Plan patient is currently denying any suicidal thoughts or intent at all.  Denies drinking.  Refuses to acknowledge any symptoms or problems and refuses to engage in treatment.  No alcohol level drawn on admission.  Patient would not be appropriate for admission to our unit.  At this point does not meet commitment criteria in his current sober condition.  Spent quite a bit of time talking with him about the nature of depression and educating him that this was a potentially fatal condition and that he had several risk factors for suicide.  Strongly suggesting to the patient that he get involved in treatment.  He did state that he would agreed to try medication for depression.  Prescription will be added at discharge for Prozac 20 mg a day and this has been ordered for now.  Wife again has been strongly urged to get all firearms and alcohol out of the house.  Case reviewed with emergency room physician.  Disposition: Patient does not meet criteria for psychiatric inpatient admission. Supportive therapy provided about ongoing stressors. Discussed crisis plan, support from social network, calling 911, coming to the Emergency Department, and calling  Suicide Hotline.  Mordecai Rasmussen, MD 11/24/2020 12:11 PM

## 2020-11-24 NOTE — ED Notes (Signed)
Resting quietly with eyes closed.

## 2020-11-24 NOTE — ED Provider Notes (Signed)
Patient reevaluated complaining of left hip pain.  X-ray shows evidence of acute left intertrochanteric fracture is impacted.  Discussed case in consultation with Dr. Odis Luster of orthopedics.  Will consult hospitalist for admission.   Willy Eddy, MD 11/24/20 [phone]

## 2020-11-24 NOTE — Consult Note (Addendum)
Full consult note to follow. Plan left hip repair tomorrow afternoon. NPO after midnight. Please hold anticoagulation. Will start TXA today.   ORTHOPAEDIC CONSULTATION  REQUESTING PHYSICIAN: Lynn Ito, MD  Chief Complaint: left hip pain  HPI: James Sanford is a 65 y.o. male who complains of left hip pain. The pain is sharp in character. The pain is severe and 10/10. The pain is worse with movement and better with rest. Denies any numbness, tingling or constitutional symptoms.  Past Medical History:  Diagnosis Date   Arthritis    Diabetes (HCC)    Hyperlipemia    Hypertension    Sleep apnea    Past Surgical History:  Procedure Laterality Date   COLONOSCOPY WITH PROPOFOL N/A 01/28/2020   Procedure: COLONOSCOPY WITH PROPOFOL;  Surgeon: Toney Reil, MD;  Location: [facility] ENDOSCOPY;  Service: Gastroenterology;  Laterality: N/A;   COLONOSCOPY WITH PROPOFOL N/A 01/28/2020   Procedure: COLONOSCOPY WITH PROPOFOL;  Surgeon: Toney Reil, MD;  Location: [facility] ENDOSCOPY;  Service: Gastroenterology;  Laterality: N/A;   ESOPHAGOGASTRODUODENOSCOPY (EGD) WITH PROPOFOL N/A 01/28/2020   Procedure: ESOPHAGOGASTRODUODENOSCOPY (EGD) WITH PROPOFOL;  Surgeon: Toney Reil, MD;  Location: [facility] ENDOSCOPY;  Service: Gastroenterology;  Laterality: N/A;   GASTRIC BYPASS     HERNIA REPAIR     JOINT REPLACEMENT Bilateral    Knee surgery   OPEN REDUCTION INTERNAL FIXATION (ORIF) DISTAL RADIAL FRACTURE Right 10/30/2017   Procedure: OPEN REDUCTION INTERNAL FIXATION (ORIF) DISTAL RADIAL FRACTURE;  Surgeon: Garnette Gunner, MD;  Location: [facility] ORS;  Service: Orthopedics;  Laterality: Right;   ORIF FEMUR FRACTURE Right 01/29/2020   Procedure: OPEN REDUCTION INTERNAL FIXATION (ORIF) DISTAL FEMUR FRACTURE;  Surgeon: Christena Flake, MD;  Location: [facility] ORS;  Service: Orthopedics;  Laterality: Right;   Social History   Socioeconomic History   Marital status: Married    Spouse name:  Not on file   Number of children: Not on file   Years of education: Not on file   Highest education level: Not on file  Occupational History   Not on file  Tobacco Use   Smoking status: Never   Smokeless tobacco: Former    Types: Chew   Tobacco comments:    quir about 4 years ago  Vaping Use   Vaping Use: Never used  Substance and Sexual Activity   Alcohol use: Yes    Comment: beer occasionally   Drug use: Not Currently   Sexual activity: Not on file  Other Topics Concern   Not on file  Social History Narrative   Lives at home with his wife. Independent baseline   Social Determinants of Corporate investment banker Strain: Not on file  Food Insecurity: Not on file  Transportation Needs: Not on file  Physical Activity: Not on file  Stress: Not on file  Social Connections: Not on file   Family History  Problem Relation Age of Onset   Stroke Mother    Suicidality Father    Allergies  Allergen Reactions   Penicillins Diarrhea    Unknown childhood reaction   Prior to Admission medications   Medication Sig Start Date End Date Taking? Authorizing Provider  cephALEXin (KEFLEX) 500 MG capsule Take 1 capsule (500 mg total) by mouth 3 (three) times daily for 7 days. 11/24/20 12/01/20 Yes Willy Eddy, MD  ELIQUIS 5 MG TABS tablet Take 5 mg by mouth 2 (two) times daily. 01/05/20  Yes [provider]  metoprolol succinate (TOPROL-XL) 50 MG 24 hr  tablet Take 0.5 tablets (25 mg total) by mouth daily. 02/06/20  Yes Enedina Finner, MD  Saccharomyces boulardii (PROBIOTIC) 250 MG CAPS Take 1 capsule by mouth in the morning and at bedtime. 11/24/20  Yes Willy Eddy, MD   CT HEAD WO CONTRAST ( )  Result Date: 11/23/2020 CLINICAL DATA:  Altered mental status. EXAM: CT HEAD WITHOUT CONTRAST TECHNIQUE: Contiguous axial images were obtained from the base of the skull through the vertex without intravenous contrast. COMPARISON:  Head CT dated 02/21/2019. FINDINGS: Brain: Mild  age-related atrophy and chronic microvascular ischemic changes. There is no acute intracranial hemorrhage. No mass effect or midline shift. No extra-axial fluid collection. Vascular: No hyperdense vessel or unexpected calcification. Skull: Normal. Negative for fracture or focal lesion. Sinuses/Orbits: No acute finding. Other: None IMPRESSION: 1. No acute intracranial pathology. 2. Mild chronic microvascular ischemic changes. Electronically Signed   By: Elgie Collard M.D.   On: 11/23/2020 19:40   DG Knee Left Port  Result Date: 11/23/2020 CLINICAL DATA:  Left knee injury.  Pain.  Six falls last night. EXAM: PORTABLE LEFT KNEE - 1-2 VIEW COMPARISON:  10/18/2007 FINDINGS: Left total knee arthroplasty with patellar femoral component. Components appear well seated. No evidence of acute fracture or dislocation. No focal bone lesion or bone destruction. No significant effusions. Vascular calcifications in the soft tissues. IMPRESSION: Left total knee arthroplasty.  No acute bony abnormalities. Electronically Signed   By: Burman Nieves M.D.   On: 11/23/2020 18:46   DG Hip Unilat W or Wo Pelvis 2-3 Views Left  Result Date: 11/24/2020 CLINICAL DATA:  Fall, pain EXAM: DG HIP (WITH OR WITHOUT PELVIS) 2-3V LEFT COMPARISON:  None. FINDINGS: Displaced, impacted intertrochanteric fractures of the proximal left femur. No other displaced fracture or dislocation of the pelvis or proximal right femur seen in frontal view only. IMPRESSION: Displaced, impacted intertrochanteric fractures of the proximal left femur. Electronically Signed   By: Lauralyn Primes M.D.   On: 11/24/2020 13:39    Positive ROS: All other systems have been reviewed and were otherwise negative with the exception of those mentioned in the HPI and as above.  Physical Exam: General: Alert, no acute distress Cardiovascular: No pedal edema Respiratory: No cyanosis, no use of accessory musculature GI: No organomegaly, abdomen is soft and  non-tender Skin: No lesions in the area of chief complaint Neurologic: Sensation intact distally Psychiatric: Patient is competent for consent with normal mood and affect Lymphatic: No axillary or cervical lymphadenopathy  MUSCULOSKELETAL: left hip short, externally rotated. Compartments soft. Good cap refill. Motor and sensory intact distally.  Assessment: Left hip intertrochanteric fracture, closed, displaced  Plan: Left hip intramedullary nail.  The diagnosis, risks, benefits and alternatives to treatment are all discussed in detail with the patient and family. Risks include but are not limited to bleeding, infection, deep vein thrombosis, pulmonary embolism, nerve or vascular injury, non-union, repeat operation, persistent pain, weakness, stiffness and death. He understands and is eager to proceed.     Lyndle Herrlich, MD    11/25/2020 12:40 PM

## 2020-11-24 NOTE — ED Notes (Signed)
Pt denies SI. Pt resting in bed with eyes closed, belongings in bag and placed in chair. Monitor cords counted.

## 2020-11-24 NOTE — ED Provider Notes (Signed)
Emergency Medicine Observation Re-evaluation Note  Elray Dains is a 65 y.o. male, seen on rounds today.  Pt initially presented to the ED for complaints of Fall, Knee Pain, and Addiction Problem Currently, the patient is resting comfortably without any complaints.  Physical Exam  BP 129/90   Pulse 80   Temp 99.4 F (37.4 C) (Oral)   Resp 18   Ht 6' (1.829 m)   Wt 77.1 kg   SpO2 100%   BMI 23.06 kg/m  Physical Exam Gen: No acute distress  Resp: Normal rise and fall of chest Neuro: Moving all four extremities Psych: Resting currently, calm and cooperative when awake    ED Course / MDM  EKG:   I have reviewed the labs performed to date as well as medications administered while in observation.  Recent changes in the last 24 hours include no acute events overnight.  Plan  Current plan is for awaiting psychiatric evaluation for further disposition for concerns for alcohol abuse and expressing SI to wife with plans to use a handgun to kill himself. Yasser Luisa Hart [place] is under involuntary commitment.      Ahmar Pickrell, Layla Maw, DO 11/24/20 [phone]

## 2020-11-25 ENCOUNTER — Inpatient Hospital Stay: Payer: BC Managed Care – PPO | Admitting: Certified Registered"

## 2020-11-25 ENCOUNTER — Encounter
Admission: EM | Disposition: A | Discharge status: Other Acute Inpatient Hospital | Payer: Self-pay | Source: Home / Self Care | Attending: Obstetrics and Gynecology

## 2020-11-25 ENCOUNTER — Inpatient Hospital Stay: Payer: BC Managed Care – PPO

## 2020-11-25 ENCOUNTER — Encounter: Payer: Self-pay | Admitting: Internal Medicine

## 2020-11-25 DIAGNOSIS — E43 Unspecified severe protein-calorie malnutrition: Secondary | ICD-10-CM | POA: Insufficient documentation

## 2020-11-25 DIAGNOSIS — F321 Major depressive disorder, single episode, moderate: Secondary | ICD-10-CM | POA: Diagnosis not present

## 2020-11-25 DIAGNOSIS — S72142A Displaced intertrochanteric fracture of left femur, initial encounter for closed fracture: Secondary | ICD-10-CM | POA: Diagnosis not present

## 2020-11-25 HISTORY — PX: INTRAMEDULLARY (IM) NAIL INTERTROCHANTERIC: SHX5875

## 2020-11-25 LAB — GLUCOSE, CAPILLARY
Glucose-Capillary: 122 mg/dL — ABNORMAL HIGH (ref 70–99)
Glucose-Capillary: 83 mg/dL (ref 70–99)
Glucose-Capillary: 87 mg/dL (ref 70–99)
Glucose-Capillary: 96 mg/dL (ref 70–99)
Glucose-Capillary: 76 mg/dL (ref 70–99)
Glucose-Capillary: 180 mg/dL — ABNORMAL HIGH (ref 70–99)

## 2020-11-25 IMAGING — RF DG HIP (WITH PELVIS) OPERATIVE*L*
1 series · 4 of 4 positions shown · non-contrast
Comparison: Preoperative radiographs yesterday.

CLINICAL DATA: Left IM nail.

EXAM:
OPERATIVE LEFT HIP (WITH PELVIS IF PERFORMED)
TECHNIQUE: Fluoroscopic spot image(s) were submitted for interpretation
post-operatively.

[Series 1: run · 4 of 4 slices shown]
[im 1/4]
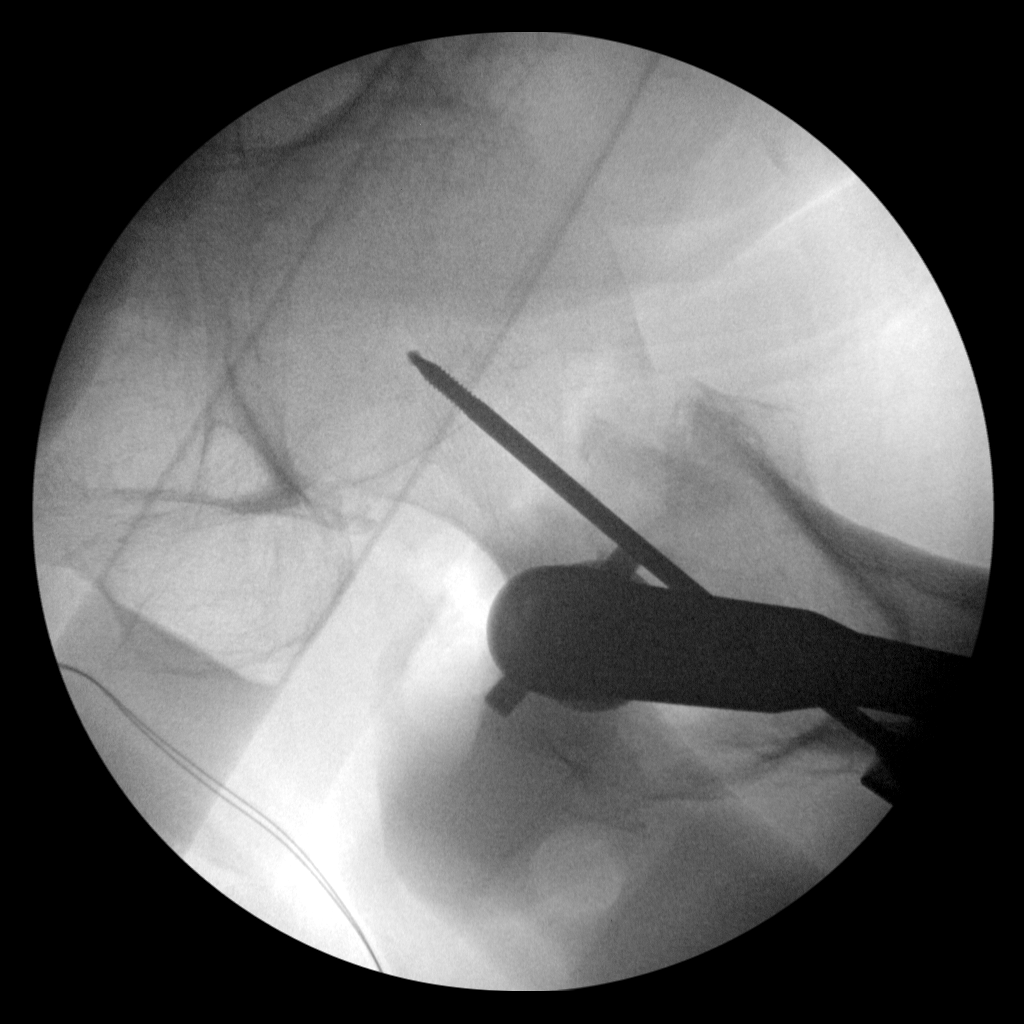
[im 2/4]
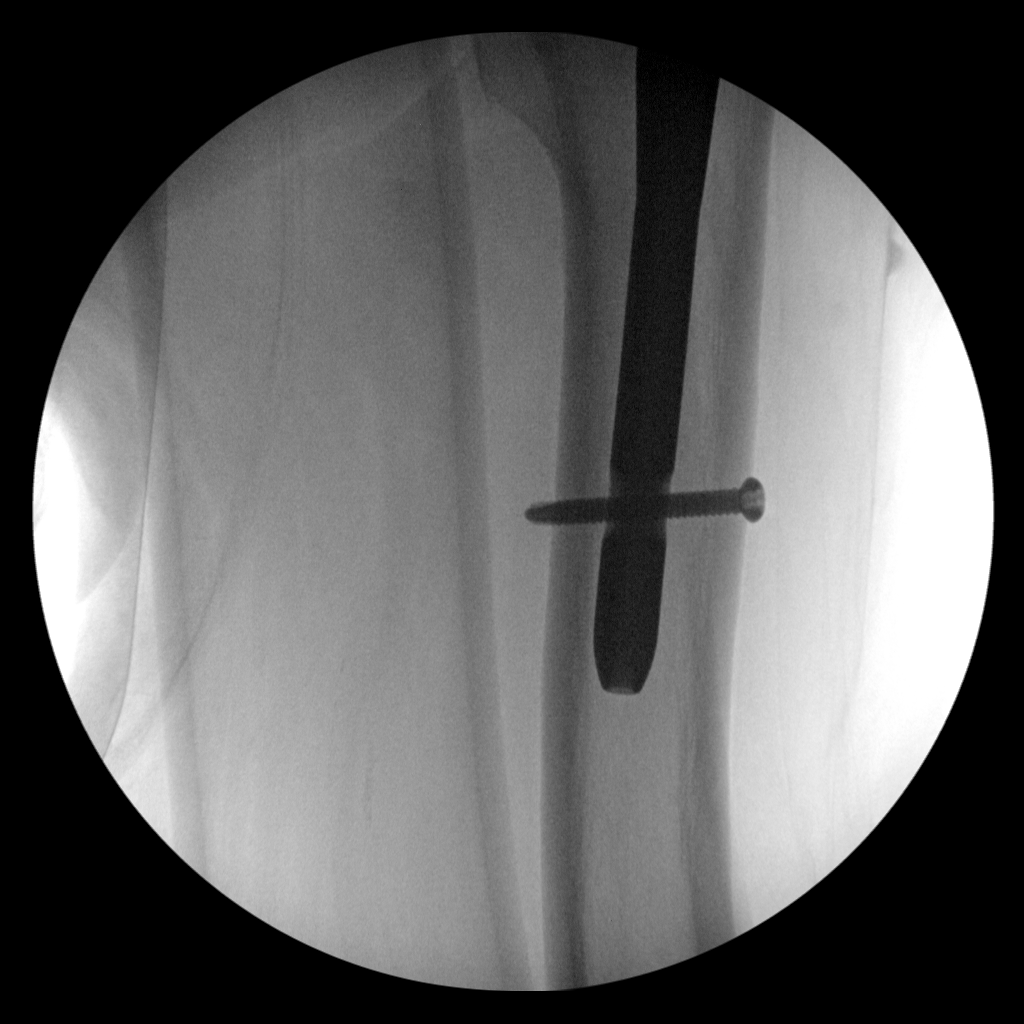
[im 3/4]
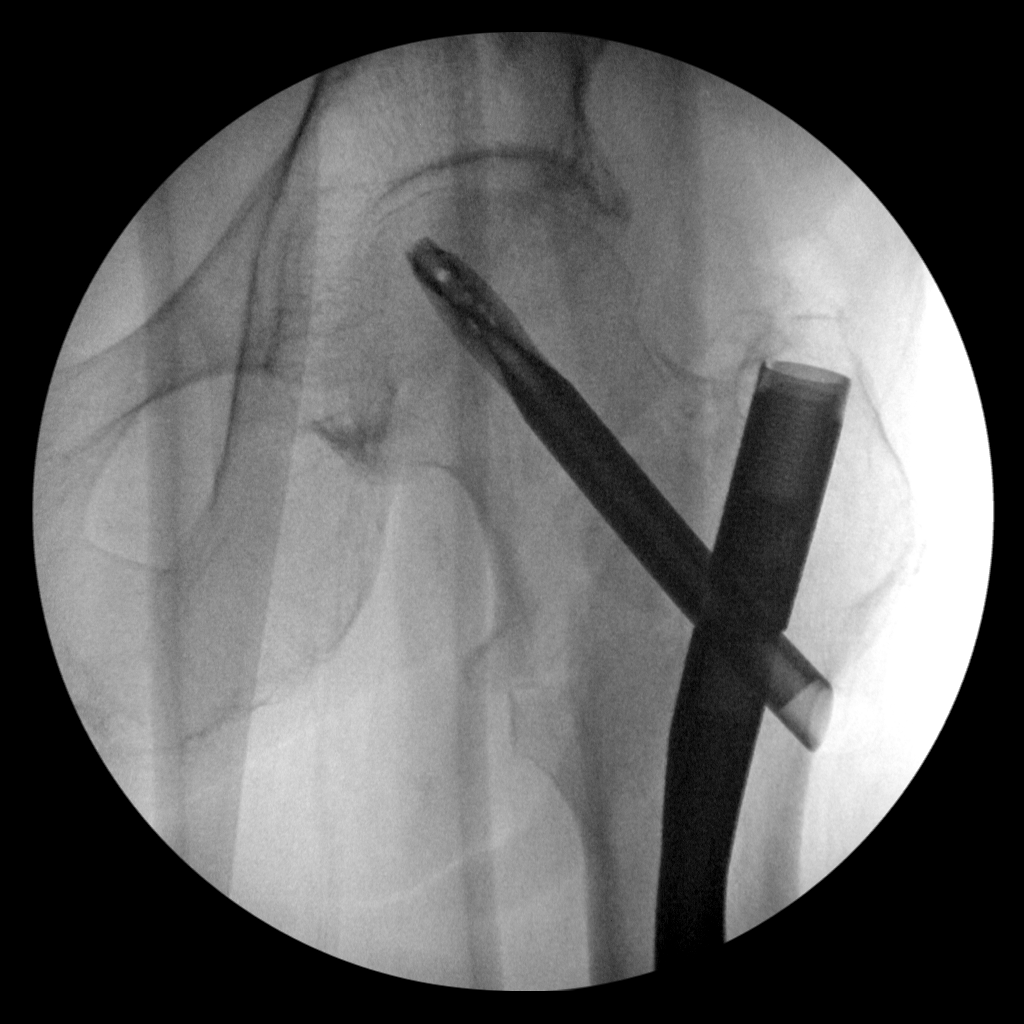
[im 4/4]
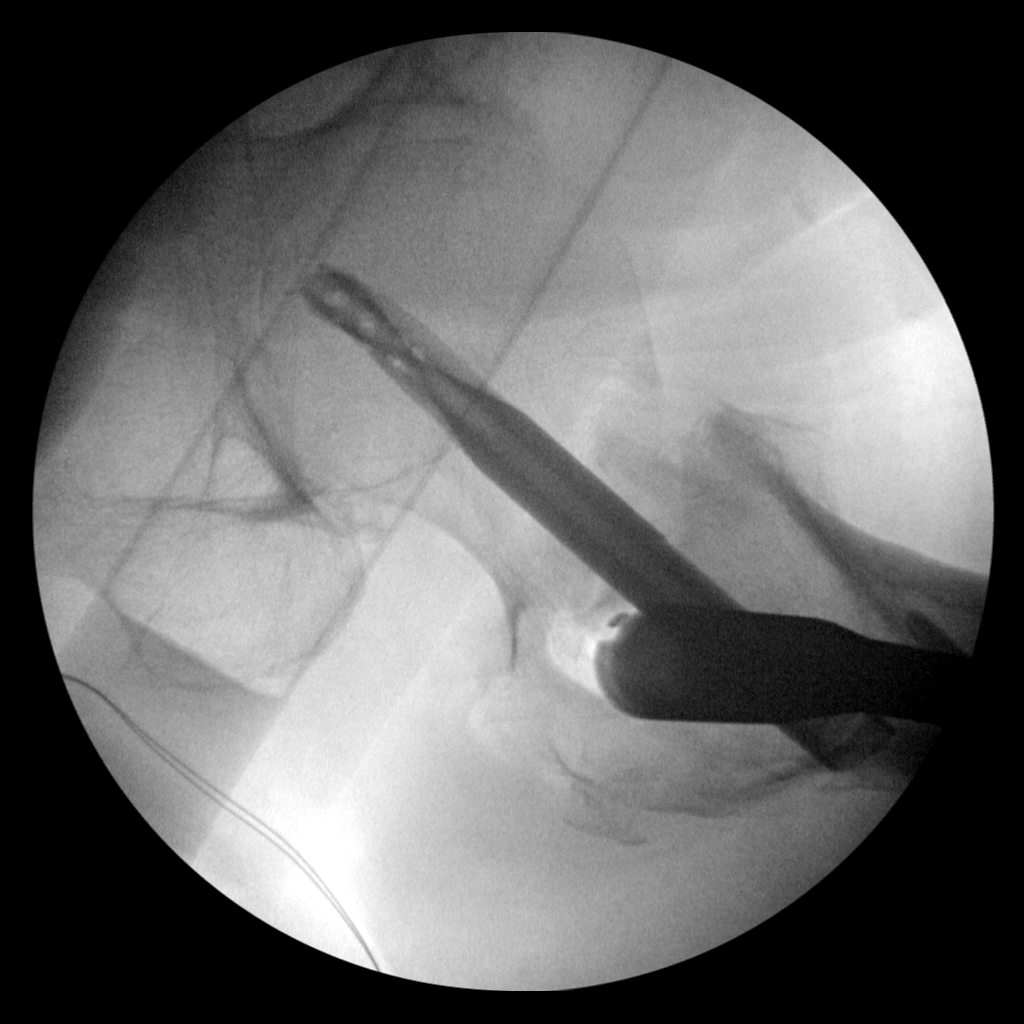

[4 of 4 positions shown; findings below may reference images not displayed]

FINDINGS: Four fluoroscopic spot views of the left hip obtained in the
operating room. Intramedullary nail attention comparison distal
locking screw transverse intertrochanteric femur fracture.
Fluoroscopy time 44 seconds. Dose not provided.
IMPRESSION: Procedural fluoroscopy for ORIF left hip fracture.

## 2020-11-25 SURGERY — FIXATION, FRACTURE, INTERTROCHANTERIC, WITH INTRAMEDULLARY ROD
Anesthesia: General | Laterality: Left

## 2020-11-25 MED ORDER — ONDANSETRON HCL 4 MG/2ML IJ SOLN: 4.0000 mg | Freq: Four times a day (QID) | INTRAMUSCULAR | Status: DC | PRN

## 2020-11-25 MED ORDER — DOCUSATE SODIUM 100 MG PO CAPS
100.0000 mg | ORAL_CAPSULE | Freq: Two times a day (BID) | ORAL | Status: DC
  Filled 2020-11-25 (×6): qty 1

## 2020-11-25 MED ORDER — MIDAZOLAM HCL 5 MG/5ML IJ SOLN
INTRAMUSCULAR | Status: DC | PRN
Start: 2020-11-25 — End: 2020-11-25
  Administered 2020-11-25: 2 mg via INTRAVENOUS

## 2020-11-25 MED ORDER — DEXAMETHASONE SODIUM PHOSPHATE 10 MG/ML IJ SOLN
INTRAMUSCULAR | Status: DC | PRN
  Administered 2020-11-25: 10 mg via INTRAVENOUS

## 2020-11-25 MED ORDER — FENTANYL CITRATE (PF) 100 MCG/2ML IJ SOLN
INTRAMUSCULAR | Status: DC | PRN
  Administered 2020-11-25 (×2): 50 ug via INTRAVENOUS

## 2020-11-25 MED ORDER — TRANEXAMIC ACID 1000 MG/10ML IV SOLN
INTRAVENOUS | Status: DC | PRN
  Administered 2020-11-25: 1000 mg via TOPICAL

## 2020-11-25 MED ORDER — SUGAMMADEX SODIUM 200 MG/2ML IV SOLN
INTRAVENOUS | Status: DC | PRN
  Administered 2020-11-25: 200 mg via INTRAVENOUS

## 2020-11-25 MED ORDER — PHENYLEPHRINE HCL (PRESSORS) 10 MG/ML IV SOLN
INTRAVENOUS | Status: DC | PRN
  Administered 2020-11-25 (×2): 200 ug via INTRAVENOUS
  Administered 2020-11-25: 100 ug via INTRAVENOUS

## 2020-11-25 MED ORDER — TRANEXAMIC ACID 1000 MG/10ML IV SOLN
INTRAVENOUS | Status: AC
  Filled 2020-11-25: qty 10

## 2020-11-25 MED ORDER — MIDAZOLAM HCL 2 MG/2ML IJ SOLN
INTRAMUSCULAR | Status: AC
  Filled 2020-11-25: qty 2

## 2020-11-25 MED ORDER — ACETAMINOPHEN 10 MG/ML IV SOLN
INTRAVENOUS | Status: AC
  Filled 2020-11-25: qty 100

## 2020-11-25 MED ORDER — 0.9 % SODIUM CHLORIDE (POUR BTL) OPTIME
TOPICAL | Status: DC | PRN
  Administered 2020-11-25: 500 mL

## 2020-11-25 MED ORDER — METOCLOPRAMIDE HCL 10 MG PO TABS: 5.0000 mg | ORAL_TABLET | Freq: Three times a day (TID) | ORAL | Status: DC | PRN

## 2020-11-25 MED ORDER — ROCURONIUM BROMIDE 100 MG/10ML IV SOLN
INTRAVENOUS | Status: DC | PRN
  Administered 2020-11-25: 50 mg via INTRAVENOUS

## 2020-11-25 MED ORDER — LIDOCAINE HCL (CARDIAC) PF 100 MG/5ML IV SOSY
PREFILLED_SYRINGE | INTRAVENOUS | Status: DC | PRN
  Administered 2020-11-25: 100 mg via INTRAVENOUS

## 2020-11-25 MED ORDER — PROPOFOL 10 MG/ML IV BOLUS
INTRAVENOUS | Status: DC | PRN
  Administered 2020-11-25: 130 mg via INTRAVENOUS
  Administered 2020-11-25: 40 mg via INTRAVENOUS

## 2020-11-25 MED ORDER — METOCLOPRAMIDE HCL 5 MG/ML IJ SOLN: 5.0000 mg | Freq: Three times a day (TID) | INTRAMUSCULAR | Status: DC | PRN

## 2020-11-25 MED ORDER — FENTANYL CITRATE (PF) 100 MCG/2ML IJ SOLN
INTRAMUSCULAR | Status: AC
  Filled 2020-11-25: qty 2

## 2020-11-25 MED ORDER — LACTATED RINGERS IV SOLN: INTRAVENOUS | Status: DC | PRN

## 2020-11-25 MED ORDER — ONDANSETRON HCL 4 MG PO TABS: 4.0000 mg | ORAL_TABLET | Freq: Four times a day (QID) | ORAL | Status: DC | PRN

## 2020-11-25 MED ORDER — ACETAMINOPHEN 10 MG/ML IV SOLN
INTRAVENOUS | Status: DC | PRN
  Administered 2020-11-25: 1000 mg via INTRAVENOUS

## 2020-11-25 MED ORDER — CEFAZOLIN SODIUM-DEXTROSE 1-4 GM/50ML-% IV SOLN
1.0000 g | Freq: Four times a day (QID) | INTRAVENOUS | Status: AC
  Administered 2020-11-25 – 2020-11-26 (×3): 1 g via INTRAVENOUS
  Filled 2020-11-25 (×3): qty 50

## 2020-11-25 SURGICAL SUPPLY — 42 items
BIT DRILL CALIBRATED 4.2 (BIT) ×1 IMPLANT
BLADE TFNA HELICAL 105 STRL (Anchor) ×2 IMPLANT
BNDG COHESIVE 4X5 TAN ST LF (GAUZE/BANDAGES/DRESSINGS) ×4 IMPLANT
BRUSH SCRUB EZ  4% CHG (MISCELLANEOUS) ×1
BRUSH SCRUB EZ 4% CHG (MISCELLANEOUS) ×1 IMPLANT
CANISTER SUCT 1200ML W/VALVE (MISCELLANEOUS) ×2 IMPLANT
CHLORAPREP W/TINT 26 (MISCELLANEOUS) ×2 IMPLANT
DRAPE 3/4 80X56 (DRAPES) ×2 IMPLANT
DRAPE U-SHAPE 47X51 STRL (DRAPES) ×2 IMPLANT
DRILL BIT CALIBRATED 4.2 (BIT) ×2
DRSG AQUACEL AG ADV 3.5X 4 (GAUZE/BANDAGES/DRESSINGS) ×2 IMPLANT
DRSG AQUACEL AG ADV 3.5X10 (GAUZE/BANDAGES/DRESSINGS) ×2 IMPLANT
ELECT REM PT RETURN 9FT ADLT (ELECTROSURGICAL) ×2
ELECTRODE REM PT RTRN 9FT ADLT (ELECTROSURGICAL) ×1 IMPLANT
GAUZE 4X4 16PLY ~~LOC~~+RFID DBL (SPONGE) IMPLANT
GAUZE XEROFORM 1X8 LF (GAUZE/BANDAGES/DRESSINGS) ×2 IMPLANT
GLOVE SURG ORTHO LTX SZ8 (GLOVE) ×2 IMPLANT
GLOVE SURG UNDER LTX SZ8 (GLOVE) ×2 IMPLANT
GOWN STRL REUS W/ TWL LRG LVL3 (GOWN DISPOSABLE) ×1 IMPLANT
GOWN STRL REUS W/ TWL XL LVL3 (GOWN DISPOSABLE) ×1 IMPLANT
GOWN STRL REUS W/TWL LRG LVL3 (GOWN DISPOSABLE) ×1
GOWN STRL REUS W/TWL XL LVL3 (GOWN DISPOSABLE) ×1
GUIDEWIRE 3.2X400 (WIRE) ×2 IMPLANT
IMPL DEG TI CANN 11MM/130 (Orthopedic Implant) ×1 IMPLANT
IMPLANT DEG TI CANN 11MM/130 (Orthopedic Implant) ×2 IMPLANT
KIT PATIENT CARE HANA TABLE (KITS) ×2 IMPLANT
KIT TURNOVER CYSTO (KITS) ×2 IMPLANT
MANIFOLD NEPTUNE II (INSTRUMENTS) ×2 IMPLANT
MAT ABSORB  FLUID 56X50 GRAY (MISCELLANEOUS) ×1
MAT ABSORB FLUID 56X50 GRAY (MISCELLANEOUS) ×1 IMPLANT
NEEDLE SPNL 20GX3.5 QUINCKE YW (NEEDLE) ×2 IMPLANT
NS IRRIG 1000ML POUR BTL (IV SOLUTION) ×2 IMPLANT
PACK HIP COMPR (MISCELLANEOUS) ×2 IMPLANT
SCREW CANN LOCK TI FT 5X42 (Screw) ×2 IMPLANT
SPONGE T-LAP 18X18 ~~LOC~~+RFID (SPONGE) ×4 IMPLANT
STAPLER SKIN PROX 35W (STAPLE) ×2 IMPLANT
SUT VIC AB 0 CT1 36 (SUTURE) ×2 IMPLANT
SUT VIC AB 2-0 CT1 27 (SUTURE) ×2
SUT VIC AB 2-0 CT1 TAPERPNT 27 (SUTURE) ×2 IMPLANT
SYR 30ML LL (SYRINGE) ×2 IMPLANT
TOWEL OR 17X26 4PK STRL BLUE (TOWEL DISPOSABLE) ×2 IMPLANT
WATER STERILE IRR 500ML POUR (IV SOLUTION) ×2 IMPLANT

## 2020-11-25 NOTE — Anesthesia Procedure Notes (Signed)
Procedure Name: Intubation Date/Time: 11/25/2020 2:00 PM Performed by: Rodney Booze, CRNA Pre-anesthesia Checklist: Patient identified, Emergency Drugs available, Suction available and Patient being monitored Patient Re-evaluated:Patient Re-evaluated prior to induction Oxygen Delivery Method: Circle system utilized Preoxygenation: Pre-oxygenation with 100% oxygen Induction Type: IV induction Ventilation: Mask ventilation without difficulty Laryngoscope Size: McGraph and 3 Grade View: Grade I Tube type: Oral Tube size: 7.0 mm Number of attempts: 1 Airway Equipment and Method: Stylet and Oral airway Placement Confirmation: ETT inserted through vocal cords under direct vision, positive ETCO2 and breath sounds checked- equal and bilateral Secured at: 21 cm Tube secured with: Tape Dental Injury: Teeth and Oropharynx as per pre-operative assessment

## 2020-11-25 NOTE — Anesthesia Preprocedure Evaluation (Signed)
Anesthesia Evaluation  Patient identified by MRN, date of birth, ID band Patient awake    Reviewed: Allergy & Precautions, NPO status , Patient's Chart, lab work & pertinent test results  History of Anesthesia Complications Negative for: history of anesthetic complications  Airway Mallampati: II  TM Distance: >3 FB Neck ROM: Full    Dental  (+) Edentulous Upper, Edentulous Lower   Pulmonary neg pulmonary ROS, neg sleep apnea, neg COPD, Patient abstained from smoking.Not current smoker,  OSA listed but patient denies   Pulmonary exam normal breath sounds clear to auscultation       Cardiovascular Exercise Tolerance: Good METShypertension, +CHF  (-) CAD and (-) Past MI + dysrhythmias Atrial Fibrillation  Rhythm:Regular Rate:Normal - Systolic murmurs - Left ventricle: Wall thickness was increased in a pattern of mild  LVH. Systolic function was moderately reduced. The estimated  ejection fraction was in the range of 35% to 40%. Doppler  parameters are consistent with abnormal left ventricular  relaxation (grade 1 diastolic dysfunction).  - Left atrium: The atrium was mildly dilated.  - Right atrium: The atrium was mildly dilated.    Neuro/Psych PSYCHIATRIC DISORDERS Depression negative neurological ROS     GI/Hepatic neg GERD  ,(+)     substance abuse  alcohol use, Patient denies alcohol use, although notes say he presented with hip fracture after falls while intoxicated per wife.   Endo/Other  diabetes  Renal/GU negative Renal ROS     Musculoskeletal   Abdominal   Peds  Hematology   Anesthesia Other Findings Past Medical History: No date: Arthritis No date: Diabetes (HCC) No date: Hyperlipemia No date: Hypertension No date: Sleep apnea  Reproductive/Obstetrics                             Anesthesia Physical Anesthesia Plan  ASA: 3  Anesthesia Plan: General   Post-op  Pain Management:    Induction: Intravenous  PONV Risk Score and Plan: 3 and Ondansetron, Dexamethasone and Treatment may vary due to age or medical condition  Airway Management Planned: Oral ETT  Additional Equipment: None  Intra-op Plan:   Post-operative Plan: Extubation in OR  Informed Consent: I have reviewed the patients History and Physical, chart, labs and discussed the procedure including the risks, benefits and alternatives for the proposed anesthesia with the patient or authorized representative who has indicated his/her understanding and acceptance.     Dental advisory given  Plan Discussed with: CRNA and Surgeon  Anesthesia Plan Comments: (Discussed risks of anesthesia with patient, including PONV, sore throat, lip/dental damage. Rare risks discussed as well, such as cardiorespiratory and neurological sequelae, and allergic reactions. Patient understands.)        Anesthesia Quick Evaluation

## 2020-11-25 NOTE — Progress Notes (Signed)
PROGRESS NOTE    James Sanford  XAJ:[id number] DOB: 01-05-1956 DOA: 11/23/2020 PCP: Jerl Mina, MD    Brief Narrative:  James Sanford is a 65 y.o. male with medical history significant for alcohol abuse, diabetes mellitus, hypertension, sleep apnea, paroxysmal atrial fibrillation on anticoagulation who presents to the ER via EMS for evaluation of a fall. His wife states he had about 6 falls 2 days prior to his hospitalization while he was intoxicated. He presented to the ER with complaints of left knee pain but denied having any head injury. Patient's wife was at the bedside states that patient has been drinking a lot more recently and is very depressed.  He has suicidal ideations with plans to kill himself with a handgun.  She states that patient's father also committed suicide.  She is concerned that he has become increasingly violent and is worried about her safety. Patient was committed in the ER and was seen by psychiatry, his IVC has been discontinued but patient will need discharge to a Geri psych area for management of severe depression once stabilized. Patient had an x-ray of the left hip which shows a displaced, impacted intertrochanteric fracture of the left femur. Wife states that because of his frequent falls while intoxicated he has had a fracture of his hand and of his leg.  She is requesting that patient get help for his substance dependence. 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 He denies having any chest pain, no shortness of breath, no nausea, no vomiting, no dizziness, no lightheadedness, no palpitations, no diaphoresis, no urinary frequency, no nocturia, no dysuria. Labs show sodium 136, potassium 4.7, chloride 101, bicarb 26, glucose 155, BUN 14, creatinine 0.74, calcium 8.1, white count 6.6, hemoglobin 13, hematocrit 38.2, MCV 98.7, RDW 17.2, platelet  count 148 Respiratory viral panel is negative Left knee x-ray shows left total knee arthroplasty.  No acute bony abnormalities. CT scan of the head without contrast shows no acute intracranial pathology.  Mild chronic microvascular ischemic changes. Left hip x-ray shows displaced, impacted intertrochanteric fracture of the proximal left femur. Twelve-lead EKG reviewed by me shows atrial fibrillation with a left bundle branch block.  8/23 plan.  OR today  Consultants:  Orthopedics  Procedures:   Antimicrobials:      Subjective: Has no complaints of pain, chest pain, shortness of breath or dizziness not scoring on CIWA protocol  Objective: Vitals:   11/24/20 1954 11/24/20 2135 11/25/20 0500 11/25/20 0741  BP: 98/69 97/67 95/69  97/69  Pulse: (!) 108 96 72 75  Resp: 16 17 16 16   Temp: 99.3 F (37.4 C) 99.7 F (37.6 C) 98.1 F (36.7 C) 98.2 F (36.8 C)  TempSrc:  Oral Oral   SpO2: 98% 100% 99% 99%  Weight:      Height:        Intake/Output Summary (Last 24 hours) at 11/25/2020 0831 Last data filed at 11/25/2020 11/27/2020 Gross per 24 hour  Intake 156.67 ml  Output 0 ml  Net 156.67 ml   Filed Weights   11/23/20 1438  Weight: 77.1 kg    Examination:  General exam: Appears calm and comfortable  Respiratory system: Clear to auscultation. Respiratory effort normal. Cardiovascular system: S1 & S2 heard, RRR. No gallops  Gastrointestinal system: Abdomen is nondistended, soft and nontender. Normal bowel sounds heard. Central nervous system: Alert and oriented.  Grossly intact no asterixis Extremities: No edema Psychiatry:  Mood & affect appropriate.     Data Reviewed: I have personally reviewed following labs and imaging studies  CBC: Recent Labs  Lab 11/23/20 1440  WBC 6.6  HGB 13.0  HCT 38.2*  MCV 98.7  PLT 148*   Basic Metabolic Panel: Recent Labs  Lab 11/23/20 1440  NA 136  K 4.7  CL 101  CO2 26  GLUCOSE 155*  BUN 14  CREATININE 0.74  CALCIUM 8.1*    GFR: Estimated Creatinine Clearance: 103.1 mL/min (by C-G formula based on SCr of 0.74 mg/dL). Liver Function Tests: No results for input(s): AST, ALT, ALKPHOS, BILITOT, PROT, ALBUMIN in the last 168 hours. No results for input(s): LIPASE, AMYLASE in the last 168 hours. No results for input(s): AMMONIA in the last 168 hours. Coagulation Profile: No results for input(s): INR, PROTIME in the last 168 hours. Cardiac Enzymes: No results for input(s): CKTOTAL, CKMB, CKMBINDEX, TROPONINI in the last 168 hours. BNP (last 3 results) No results for input(s): PROBNP in the last 8760 hours. HbA1C: Recent Labs    11/23/20 1447  HGBA1C 5.3   CBG: Recent Labs  Lab 11/24/20 1844 11/24/20 2234 11/25/20 0741  GLUCAP 116* 202* 122*   Lipid Profile: No results for input(s): CHOL, HDL, LDLCALC, TRIG, CHOLHDL, LDLDIRECT in the last 72 hours. Thyroid Function Tests: No results for input(s): TSH, T4TOTAL, FREET4, T3FREE, THYROIDAB in the last 72 hours. Anemia Panel: No results for input(s): VITAMINB12, FOLATE, FERRITIN, TIBC, IRON, RETICCTPCT in the last 72 hours. Sepsis Labs: No results for input(s): PROCALCITON, LATICACIDVEN in the last 168 hours.  Recent Results (from the past 240 hour(s))  Resp Panel by RT-PCR (Flu A&B, Covid) Nasopharyngeal Swab     Status: None   Collection Time: 11/24/20  4:21 AM   Specimen: Nasopharyngeal Swab; Nasopharyngeal(NP) swabs in vial transport medium  Result Value Ref Range Status   SARS Coronavirus 2 by RT PCR NEGATIVE NEGATIVE Final    Comment: (NOTE) SARS-CoV-2 target nucleic acids are NOT DETECTED.  The SARS-CoV-2 RNA is generally detectable in upper respiratory specimens during the acute phase of infection. The lowest concentration of SARS-CoV-2 viral copies this assay can detect is 138 copies/mL. A negative result does not preclude SARS-Cov-2 infection and should not be used as the sole basis for treatment or other patient management decisions. A  negative result may occur with  improper specimen collection/handling, submission of specimen other than nasopharyngeal swab, presence of viral mutation(s) within the areas targeted by this assay, and inadequate number of viral copies(<138 copies/mL). A negative result must be combined with clinical observations, patient history, and epidemiological information. The expected result is Negative.  Fact Sheet for Patients:  BloggerCourse.com  Fact Sheet for Healthcare Providers:  SeriousBroker.it  This test is no t yet approved or cleared by the [place] FDA and  has been authorized for detection and/or diagnosis of SARS-CoV-2 by FDA under an Emergency Use Authorization (EUA). This EUA will remain  in effect (meaning this test can be used) for the duration of the COVID-19 declaration under Section 564(b)(1) of the Act, 21 U.S.C.section 360bbb-3(b)(1), unless the authorization is terminated  or revoked sooner.       Influenza A by PCR NEGATIVE NEGATIVE Final   Influenza B by PCR NEGATIVE NEGATIVE Final    Comment: (NOTE) The Xpert Xpress SARS-CoV-2/FLU/RSV plus assay is intended as an aid in the diagnosis of influenza from Nasopharyngeal swab specimens and should not be used as a sole basis for treatment. Nasal washings  and aspirates are unacceptable for Xpert Xpress SARS-CoV-2/FLU/RSV testing.  Fact Sheet for Patients: BloggerCourse.com  Fact Sheet for Healthcare Providers: SeriousBroker.it  This test is not yet approved or cleared by the [place] FDA and has been authorized for detection and/or diagnosis of SARS-CoV-2 by FDA under an Emergency Use Authorization (EUA). This EUA will remain in effect (meaning this test can be used) for the duration of the COVID-19 declaration under Section 564(b)(1) of the Act, 21 U.S.C. section 360bbb-3(b)(1), unless the authorization  is terminated or revoked.  Performed at [facility], [address], [place], [place] [address]          Radiology Studies: CT HEAD WO CONTRAST ( )  Result Date: 11/23/2020 CLINICAL DATA:  Altered mental status. EXAM: CT HEAD WITHOUT CONTRAST TECHNIQUE: Contiguous axial images were obtained from the base of the skull through the vertex without intravenous contrast. COMPARISON:  Head CT dated 02/21/2019. FINDINGS: Brain: Mild age-related atrophy and chronic microvascular ischemic changes. There is no acute intracranial hemorrhage. No mass effect or midline shift. No extra-axial fluid collection. Vascular: No hyperdense vessel or unexpected calcification. Skull: Normal. Negative for fracture or focal lesion. Sinuses/Orbits: No acute finding. Other: None IMPRESSION: 1. No acute intracranial pathology. 2. Mild chronic microvascular ischemic changes. Electronically Signed   By: Elgie Collard M.D.   On: 11/23/2020 19:40   DG Knee Left Port  Result Date: 11/23/2020 CLINICAL DATA:  Left knee injury.  Pain.  Six falls last night. EXAM: PORTABLE LEFT KNEE - 1-2 VIEW COMPARISON:  10/18/2007 FINDINGS: Left total knee arthroplasty with patellar femoral component. Components appear well seated. No evidence of acute fracture or dislocation. No focal bone lesion or bone destruction. No significant effusions. Vascular calcifications in the soft tissues. IMPRESSION: Left total knee arthroplasty.  No acute bony abnormalities. Electronically Signed   By: Burman Nieves M.D.   On: 11/23/2020 18:46   DG Hip Unilat W or Wo Pelvis 2-3 Views Left  Result Date: 11/24/2020 CLINICAL DATA:  Fall, pain EXAM: DG HIP (WITH OR WITHOUT PELVIS) 2-3V LEFT COMPARISON:  None. FINDINGS: Displaced, impacted intertrochanteric fractures of the proximal left femur. No other displaced fracture or dislocation of the pelvis or proximal right femur seen in frontal view only. IMPRESSION: Displaced, impacted  intertrochanteric fractures of the proximal left femur. Electronically Signed   By: Lauralyn Primes M.D.   On: 11/24/2020 13:39        Scheduled Meds:  FLUoxetine  20 mg Oral Daily   folic acid  1 mg Oral Daily   insulin aspart  0-15 Units Subcutaneous TID WC   lisinopril  5 mg Oral Daily   LORazepam  0-4 mg Intravenous Q6H   Or   LORazepam  0-4 mg Oral Q6H   [START ON 11/26/2020] LORazepam  0-4 mg Intravenous Q12H   Or   [START ON 11/26/2020] LORazepam  0-4 mg Oral Q12H   metoprolol succinate  25 mg Oral Daily   multivitamin with minerals  1 tablet Oral Daily   thiamine  100 mg Oral Daily   Continuous Infusions:   ceFAZolin (ANCEF) IV     cefTRIAXone (ROCEPHIN)  IV Stopped (11/24/20 1617)   methocarbamol (ROBAXIN) IV     tranexamic acid 1,000 mg (11/24/20 2131)    Assessment & Plan:   Principal Problem:   Intertrochanteric fracture of left femur, closed, initial encounter (HCC) Active Problems:   Alcohol abuse   Diabetes mellitus type 2, uncomplicated (HCC)   Hypertension  Moderate major depression, single episode (HCC)   Acute lower UTI   Severe depression with suicidal ideation Patient does not have any insight to his condition His wife has recordings of multiple threats that he has made and a plan to use a handgun. She feels unsafe with him at home and wants him to get help. Patient has been seen by psychiatry who recommends an antidepressant but patient has refused to take the antidepressant. He is supposed to be on Lexapro at home but does not take it either Discussed with psychiatry and he does not need one-to-one precautions at this time but will need to be discharged to facility with [facility] psych for stabilization 8/23 Per psychiatry patient would not be appropriate for admission to inpatient psych unit Prescription will be added at discharge for Prozac 20 mg a day and this has been ordered for wife was urged by psychiatry to get all firearms and alcohol out of the  house.       UTI Empirically being treated with Rocephin  Awaiting urine culture       History of atrial fibrillation Continue metoprolol for rate controlled Eliquis held for planned procedure        Alcohol abuse On CIWA protocol  Continue folic acid MVI and thiamine  Currently not scoring        Hypertension BP on lower side. Will discontinue lisinopril Continue beta-blockers       Diabet mellitus BG stable Continue R-ISS  DVT prophylaxis: SCD Code Status: Full Family Communication: None at bedside Disposition Plan:  Status is: Inpatient  Remains inpatient appropriate because:Inpatient level of care appropriate due to severity of illness  Dispo: The patient is from: Home              Anticipated d/c is to:  To be determined              Patient currently is not medically stable to d/c.   Difficult to place patient No            LOS: 1 day   Time spent: 45 minutes with more than 50% on COC    Lynn Ito, MD [facility] Pager [phone]  If 7PM-7AM, please contact night-coverage 11/25/2020, 8:31 AM

## 2020-11-25 NOTE — Transfer of Care (Signed)
Immediate Anesthesia Transfer of Care Note  Patient: Eugune Sine Nitta  Procedure(s) Performed: INTRAMEDULLARY (IM) NAIL INTERTROCHANTRIC (Left)  Patient Location: PACU  Anesthesia Type:General  Level of Consciousness: awake, alert  and oriented  Airway & Oxygen Therapy: Patient Spontanous Breathing and Patient connected to face mask oxygen  Post-op Assessment: Report given to RN and Post -op Vital signs reviewed and stable  Post vital signs: stable  Last Vitals:  Vitals Value Taken Time  BP 108/75 11/25/20 1523  Temp    Pulse 54 11/25/20 1524  Resp 20 11/25/20 1527  SpO2 92 % 11/25/20 1524  Vitals shown include unvalidated device data.  Last Pain:  Vitals:   11/25/20 1302  TempSrc: Oral  PainSc:          Complications: No notable events documented.

## 2020-11-25 NOTE — Consult Note (Signed)
[facility] Face-to-Face Psychiatry Consult   Reason for Consult: Consult follow-up with this 65 year old man with hip fracture and severe depression and alcohol abuse Referring Physician: Marylu Lund Patient Identification: James Sanford MRN:  [id number] Principal Diagnosis: Moderate major depression, single episode (HCC) Diagnosis:  Principal Problem:   Moderate major depression, single episode (HCC) Active Problems:   Alcohol abuse   Diabetes mellitus type 2, uncomplicated (HCC)   Hypertension   Intertrochanteric fracture of left femur, closed, initial encounter (HCC)   Acute lower UTI   Protein-calorie malnutrition, severe   Total Time spent with patient: 30 minutes  Subjective:   James Sanford is a 65 y.o. male patient admitted with "I am fine".  HPI: Patient seen yesterday in the emergency room.  Wife has very clear description including documentation of alcohol abuse and evidence of severe depression with suicidal statements.  Patient denies all of it.  Today tells me he is feeling fine.  As soon as I brought up the possibility of depression he disengaged told me that he was all done talking.  Would not discuss anything further about his mood.  Denies any suicidal ideation.  Past Psychiatric History: History of depression and alcohol abuse but completely resistant to treatment  Risk to Self:   Risk to Others:   Prior Inpatient Therapy:   Prior Outpatient Therapy:    Past Medical History:  Past Medical History:  Diagnosis Date   Arthritis    Diabetes (HCC)    Hyperlipemia    Hypertension    Sleep apnea     Past Surgical History:  Procedure Laterality Date   COLONOSCOPY WITH PROPOFOL N/A 01/28/2020   Procedure: COLONOSCOPY WITH PROPOFOL;  Surgeon: Toney Reil, MD;  Location: [facility] ENDOSCOPY;  Service: Gastroenterology;  Laterality: N/A;   COLONOSCOPY WITH PROPOFOL N/A 01/28/2020   Procedure: COLONOSCOPY WITH PROPOFOL;  Surgeon: Toney Reil, MD;   Location: [facility] ENDOSCOPY;  Service: Gastroenterology;  Laterality: N/A;   ESOPHAGOGASTRODUODENOSCOPY (EGD) WITH PROPOFOL N/A 01/28/2020   Procedure: ESOPHAGOGASTRODUODENOSCOPY (EGD) WITH PROPOFOL;  Surgeon: Toney Reil, MD;  Location: [facility] ENDOSCOPY;  Service: Gastroenterology;  Laterality: N/A;   GASTRIC BYPASS     HERNIA REPAIR     JOINT REPLACEMENT Bilateral    Knee surgery   OPEN REDUCTION INTERNAL FIXATION (ORIF) DISTAL RADIAL FRACTURE Right 10/30/2017   Procedure: OPEN REDUCTION INTERNAL FIXATION (ORIF) DISTAL RADIAL FRACTURE;  Surgeon: Garnette Gunner, MD;  Location: [facility] ORS;  Service: Orthopedics;  Laterality: Right;   ORIF FEMUR FRACTURE Right 01/29/2020   Procedure: OPEN REDUCTION INTERNAL FIXATION (ORIF) DISTAL FEMUR FRACTURE;  Surgeon: Christena Flake, MD;  Location: [facility] ORS;  Service: Orthopedics;  Laterality: Right;   Family History:  Family History  Problem Relation Age of Onset   Stroke Mother    Suicidality Father    Family Psychiatric  History: See previous Social History:  Social History   Substance and Sexual Activity  Alcohol Use Yes   Comment: beer occasionally     Social History   Substance and Sexual Activity  Drug Use Not Currently    Social History   Socioeconomic History   Marital status: Married    Spouse name: Not on file   Number of children: Not on file   Years of education: Not on file   Highest education level: Not on file  Occupational History   Not on file  Tobacco Use   Smoking status: Never   Smokeless tobacco: Former    Types: Sports administrator  Tobacco comments:    Luellen Pucker about 4 years ago  Vaping Use   Vaping Use: Never used  Substance and Sexual Activity   Alcohol use: Yes    Comment: beer occasionally   Drug use: Not Currently   Sexual activity: Not on file  Other Topics Concern   Not on file  Social History Narrative   Lives at home with his wife. Independent baseline   Social Determinants of Corporate investment banker  Strain: Not on file  Food Insecurity: Not on file  Transportation Needs: Not on file  Physical Activity: Not on file  Stress: Not on file  Social Connections: Not on file   Additional Social History:    Allergies:   Allergies  Allergen Reactions   Penicillins Diarrhea    TOLERATED CEFAZOLIN 11/25/20 Unknown childhood reaction    Labs:  Results for orders placed or performed during the hospital encounter of 11/23/20 (from the past 48 hour(s))  Urinalysis, Complete w Microscopic Urine, Clean Catch     Status: Abnormal   Collection Time: 11/23/20  9:30 PM  Result Value Ref Range   Color, Urine YELLOW YELLOW   APPearance CLOUDY (A) CLEAR   Specific Gravity, Urine >1.030 (H) 1.005 - 1.030   pH 5.0 5.0 - 8.0   Glucose, UA NEGATIVE NEGATIVE mg/dL   Hgb urine dipstick TRACE (A) NEGATIVE   Bilirubin Urine SMALL (A) NEGATIVE   Ketones, ur NEGATIVE NEGATIVE mg/dL   Protein, ur TRACE (A) NEGATIVE mg/dL   Nitrite POSITIVE (A) NEGATIVE   Leukocytes,Ua TRACE (A) NEGATIVE   RBC / HPF 6-10 0 - 5 RBC/hpf   WBC, UA >50 (H) 0 - 5 WBC/hpf   Bacteria, UA MANY (A) NONE SEEN   Squamous Epithelial / LPF 0-5 0 - 5   Mucus PRESENT    Hyaline Casts, UA PRESENT     Comment: Performed at [facility], [address] [place], [place] [address]  Resp Panel by RT-PCR (Flu A&B, Covid) Nasopharyngeal Swab     Status: None   Collection Time: 11/24/20  4:21 AM   Specimen: Nasopharyngeal Swab; Nasopharyngeal(NP) swabs in vial transport medium  Result Value Ref Range   SARS Coronavirus 2 by RT PCR NEGATIVE NEGATIVE    Comment: (NOTE) SARS-CoV-2 target nucleic acids are NOT DETECTED.  The SARS-CoV-2 RNA is generally detectable in upper respiratory specimens during the acute phase of infection. The lowest concentration of SARS-CoV-2 viral copies this assay can detect is 138 copies/mL. A negative result does not preclude SARS-Cov-2 infection and should not be used as the sole basis for treatment  or other patient management decisions. A negative result may occur with  improper specimen collection/handling, submission of specimen other than nasopharyngeal swab, presence of viral mutation(s) within the areas targeted by this assay, and inadequate number of viral copies(<138 copies/mL). A negative result must be combined with clinical observations, patient history, and epidemiological information. The expected result is Negative.  Fact Sheet for Patients:  BloggerCourse.com  Fact Sheet for Healthcare Providers:  SeriousBroker.it  This test is no t yet approved or cleared by the [place] FDA and  has been authorized for detection and/or diagnosis of SARS-CoV-2 by FDA under an Emergency Use Authorization (EUA). This EUA will remain  in effect (meaning this test can be used) for the duration of the COVID-19 declaration under Section 564(b)(1) of the Act, 21 U.S.C.section 360bbb-3(b)(1), unless the authorization is terminated  or revoked sooner.  Influenza A by PCR NEGATIVE NEGATIVE   Influenza B by PCR NEGATIVE NEGATIVE    Comment: (NOTE) The Xpert Xpress SARS-CoV-2/FLU/RSV plus assay is intended as an aid in the diagnosis of influenza from Nasopharyngeal swab specimens and should not be used as a sole basis for treatment. Nasal washings and aspirates are unacceptable for Xpert Xpress SARS-CoV-2/FLU/RSV testing.  Fact Sheet for Patients: BloggerCourse.com  Fact Sheet for Healthcare Providers: SeriousBroker.it  This test is not yet approved or cleared by the [place] FDA and has been authorized for detection and/or diagnosis of SARS-CoV-2 by FDA under an Emergency Use Authorization (EUA). This EUA will remain in effect (meaning this test can be used) for the duration of the COVID-19 declaration under Section 564(b)(1) of the Act, 21 U.S.C. section  360bbb-3(b)(1), unless the authorization is terminated or revoked.  Performed at [facility], [address] [place], [place] [address]   CBG monitoring, ED     Status: Abnormal   Collection Time: 11/24/20  6:44 PM  Result Value Ref Range   Glucose-Capillary 116 (H) 70 - 99 mg/dL    Comment: Glucose reference range applies only to samples taken after fasting for at least 8 hours.  Glucose, capillary     Status: Abnormal   Collection Time: 11/24/20 10:34 PM  Result Value Ref Range   Glucose-Capillary 202 (H) 70 - 99 mg/dL    Comment: Glucose reference range applies only to samples taken after fasting for at least 8 hours.   Comment 1 Notify RN    Comment 2 Document in Chart   Glucose, capillary     Status: Abnormal   Collection Time: 11/25/20  7:41 AM  Result Value Ref Range   Glucose-Capillary 122 (H) 70 - 99 mg/dL    Comment: Glucose reference range applies only to samples taken after fasting for at least 8 hours.   Comment 1 Notify RN   Glucose, capillary     Status: None   Collection Time: 11/25/20 11:55 AM  Result Value Ref Range   Glucose-Capillary 83 70 - 99 mg/dL    Comment: Glucose reference range applies only to samples taken after fasting for at least 8 hours.  Glucose, capillary     Status: None   Collection Time: 11/25/20  1:04 PM  Result Value Ref Range   Glucose-Capillary 87 70 - 99 mg/dL    Comment: Glucose reference range applies only to samples taken after fasting for at least 8 hours.  Glucose, capillary     Status: None   Collection Time: 11/25/20  3:26 PM  Result Value Ref Range   Glucose-Capillary 96 70 - 99 mg/dL    Comment: Glucose reference range applies only to samples taken after fasting for at least 8 hours.  Glucose, capillary     Status: None   Collection Time: 11/25/20  4:27 PM  Result Value Ref Range   Glucose-Capillary 76 70 - 99 mg/dL    Comment: Glucose reference range applies only to samples taken after fasting for at least 8  hours.    Current Facility-Administered Medications  Medication Dose Route Frequency Provider Last Rate Last Admin   cefTRIAXone (ROCEPHIN) 1 g in sodium chloride 0.9 % 100 mL IVPB  1 g Intravenous Q24H Agbata, Tochukwu, MD   Stopped at 11/24/20 1617   FLUoxetine (PROZAC) capsule 20 mg  20 mg Oral Daily Orean Giarratano, Jackquline Denmark, MD   20 mg at 11/25/20 8466   folic acid (FOLVITE) tablet 1 mg  1 mg Oral Daily Ward, Kristen N, DO   1 mg at 11/25/20 [id number]   HYDROcodone-acetaminophen (NORCO/VICODIN) 5-325 MG per tablet 1-2 tablet  1-2 tablet Oral Q6H PRN Agbata, Tochukwu, MD   1 tablet at 11/24/20 2028   insulin aspart (novoLOG) injection 0-15 Units  0-15 Units Subcutaneous TID WC Agbata, Tochukwu, MD   2 Units at 11/25/20 0749   LORazepam (ATIVAN) injection 0-4 mg  0-4 mg Intravenous Q6H Ward, Kristen N, DO       Or   LORazepam (ATIVAN) tablet 0-4 mg  0-4 mg Oral Q6H Ward, Kristen N, DO   1 mg at 11/24/20 1447   [START ON 11/26/2020] LORazepam (ATIVAN) injection 0-4 mg  0-4 mg Intravenous Q12H Ward, Kristen N, DO       Or   [START ON 11/26/2020] LORazepam (ATIVAN) tablet 0-4 mg  0-4 mg Oral Q12H Ward, Kristen N, DO       methocarbamol (ROBAXIN) tablet 500 mg  500 mg Oral Q6H PRN Agbata, Tochukwu, MD       Or   methocarbamol (ROBAXIN) 500 mg in dextrose 5 % 50 mL IVPB  500 mg Intravenous Q6H PRN Agbata, Tochukwu, MD       metoprolol succinate (TOPROL-XL) 24 hr tablet 25 mg  25 mg Oral Daily Ward, Kristen N, DO   25 mg at 11/24/20 [id number]   morphine 2 MG/ML injection 0.5 mg  0.5 mg Intravenous Q2H PRN Agbata, Tochukwu, MD       multivitamin with minerals tablet 1 tablet  1 tablet Oral Daily Ward, Kristen N, DO   1 tablet at 11/25/20 [id number]   thiamine tablet 100 mg  100 mg Oral Daily Ward, Kristen N, DO   100 mg at 11/25/20 [id number]   tranexamic acid (CYKLOKAPRON) IVPB 1,000 mg  1,000 mg Intravenous BID Lyndle HerrlichBowers, James R, MD 600 mL/hr at 11/25/20 0934 1,000 mg at 11/25/20 [id number]    Musculoskeletal: Strength & Muscle  Tone: decreased Gait & Station: unsteady Patient leans: N/A            Psychiatric Specialty Exam:  Presentation  General Appearance:  No data recorded Eye Contact: No data recorded Speech: No data recorded Speech Volume: No data recorded Handedness: No data recorded  Mood and Affect  Mood: No data recorded Affect: No data recorded  Thought Process  Thought Processes: No data recorded Descriptions of Associations:No data recorded Orientation:No data recorded Thought Content:No data recorded History of Schizophrenia/Schizoaffective disorder:No  Duration of Psychotic Symptoms:No data recorded Hallucinations:No data recorded Ideas of Reference:No data recorded Suicidal Thoughts:No data recorded Homicidal Thoughts:No data recorded  Sensorium  Memory: No data recorded Judgment: No data recorded Insight: No data recorded  Executive Functions  Concentration: No data recorded Attention Span: No data recorded Recall: No data recorded Fund of Knowledge: No data recorded Language: No data recorded  Psychomotor Activity  Psychomotor Activity: No data recorded  Assets  Assets: No data recorded  Sleep  Sleep: No data recorded  Physical Exam: Physical Exam Vitals and nursing note reviewed.  Constitutional:      Appearance: Normal appearance.  HENT:     Head: Normocephalic and atraumatic.     Mouth/Throat:     Pharynx: Oropharynx is clear.  Eyes:     Pupils: Pupils are equal, round, and reactive to light.  Cardiovascular:     Rate and Rhythm: Normal rate and regular rhythm.  Pulmonary:     Effort: Pulmonary effort is normal.  Breath sounds: Normal breath sounds.  Abdominal:     General: Abdomen is flat.     Palpations: Abdomen is soft.  Musculoskeletal:        General: Normal range of motion.       Legs:  Skin:    General: Skin is warm and dry.  Neurological:     General: No focal deficit present.     Mental Status: He is  alert. Mental status is at baseline.  Psychiatric:        Attention and Perception: Attention normal.        Mood and Affect: Mood normal. Affect is blunt.        Speech: Speech is delayed.        Behavior: Behavior is slowed.        Thought Content: Thought content normal.   Review of Systems  Constitutional: Negative.   HENT: Negative.    Eyes: Negative.   Respiratory: Negative.    Cardiovascular: Negative.   Gastrointestinal: Negative.   Musculoskeletal: Negative.   Skin: Negative.   Neurological: Negative.   Psychiatric/Behavioral: Negative.    Blood pressure 107/76, pulse (!) 58, temperature 98.3 F (36.8 C), resp. rate 15, height 6' (1.829 m), weight 77.1 kg, SpO2 100 %. Body mass index is 23.06 kg/m.  Treatment Plan Summary: Plan discussed with hospitalist yesterday that it is unnecessary to place commitment at this time as the patient is unable to leave the hospital and is not likely to try to injure himself in the hospital.  Tried once again to talk with him about depression and do some psychoeducation but he was resistant to even engaging in listening.  Refuses to take any medication.  Patient at this point still in the hospital recovering from hip surgery.  Unclear what disposition will be.  May need to file for involuntary commitment at some point if he continues to be resistant to treatment depending on what the options are for disposition.  If he winds up going to rehab then it is going to be unlikely that a geriatric psychiatry unit would take him.  We will continue to visit and try and engage in some rapport.  Disposition: Recommend psychiatric Inpatient admission when medically cleared.  Mordecai Rasmussen, MD 11/25/2020 5:05 PM

## 2020-11-25 NOTE — Progress Notes (Signed)
Initial Nutrition Assessment  DOCUMENTATION CODES:  Severe malnutrition in context of chronic illness  INTERVENTION:  Advance diet as medically able.  Add Magic cup TID with meals, each supplement provides 290 kcal and 9 grams of protein.  Add double protein to all meals.  Continue CIWA protocol.  NUTRITION DIAGNOSIS:  Severe Malnutrition related to chronic illness as evidenced by mild fat depletion, moderate fat depletion, mild muscle depletion, moderate muscle depletion, severe muscle depletion.  GOAL:  Patient will meet greater than or equal to 90% of their needs  MONITOR:  Diet advancement, PO intake, Supplement acceptance, Labs, Weight trends, Skin, I & O's  REASON FOR ASSESSMENT:  Consult Hip fracture protocol  ASSESSMENT:  65 yo male with a PMH of alcohol abuse, diabetes mellitus, hypertension, sleep apnea, paroxysmal atrial fibrillation on anticoagulation who presents to the ER via EMS for evaluation of a fall. His wife states he had about 6 falls 2 days prior to his hospitalization while he was intoxicated. Patient had an x-ray of the left hip which shows a displaced, impacted intertrochanteric fracture of the left femur.  Spoke with pt at bedside. Pt reports that he eats well at home with no appetite or PO changes recently. He also reports no recent weight changes. He reports that he does not like Ensure or Boost, but is willing to take [employer].  No recent weight history in Epic. Per Care Everywhere, pt weighed 81.3 kg on 06/16/2020 and on admission, weighed 77.1 kg. This is a 9 lb (5%) loss in 5 months, which is not necessarily significant for the time frame.  Pt with moderate to severe depletions throughout body.  RD to double protein portions and add Magic Cup TID to promote intake once diet is advanced. Also recommend continuing CIWA with history of EtOH abuse.  Medications: reviewed; SSI, folic acid, MVI with minerals, thiamine  Labs: reviewed; CBG 116-202  (H) HbA1c: 5.3% (11/23/2020)  NUTRITION - FOCUSED PHYSICAL EXAM: Flowsheet Row Most Recent Value  Orbital Region Moderate depletion  Upper Arm Region Mild depletion  Thoracic and Lumbar Region No depletion  Buccal Region Mild depletion  Temple Region Moderate depletion  Clavicle Bone Region Severe depletion  Clavicle and Acromion Bone Region Severe depletion  Scapular Bone Region Unable to assess  Dorsal Hand Mild depletion  Patellar Region Mild depletion  Anterior Thigh Region Mild depletion  Posterior Calf Region Mild depletion  Edema (RD Assessment) None  Hair Reviewed  Eyes Reviewed  Mouth Reviewed  Skin Reviewed  Nails Reviewed   Diet Order:   Diet Order             Diet NPO time specified  Diet effective midnight                  EDUCATION NEEDS:  Education needs have been addressed  Skin:  Skin Assessment: Reviewed RN Assessment (Abrasions, ecchymosis)  Last BM:  11/24/20  Height:  Ht Readings from Last 1 Encounters:  11/23/20 6' (1.829 m)   Weight:  Wt Readings from Last 1 Encounters:  11/23/20 77.1 kg   BMI:  Body mass index is 23.06 kg/m.  Estimated Nutritional Needs:  Kcal:  2200-2400 Protein:  90-105 grams Fluid:  >2.2 L  Vertell Limber, RD, LDN (she/her/hers) Registered Dietitian I After-Hours/Weekend Pager # in [place]

## 2020-11-25 NOTE — Op Note (Signed)
DATE OF SURGERY:  11/25/2020  TIME: 3:19 PM  PATIENT NAME:  James Sanford  AGE: 65 y.o.  PRE-OPERATIVE DIAGNOSIS:  Left Hip Fracture  POST-OPERATIVE DIAGNOSIS:  SAME  PROCEDURE:  LEFT INTRAMEDULLARY (IM) NAIL INTERTROCHANTRIC  SURGEON:  Lyndle Herrlich  EBL:  100 cc  COMPLICATIONS:  none apparent  OPERATIVE IMPLANTS: Synthes trochanteric femoral nail  11 mm x 170 mm  with interlocking helical blade  105 mm and 42 mm locking bolt  PREOPERATIVE INDICATIONS:  [name] is a 65 y.o. year old who fell and suffered a hip fracture. He was brought into the ER and then admitted and optimized and then elected for surgical intervention.    The risks benefits and alternatives were discussed with the patient including but not limited to the risks of nonoperative treatment, versus surgical intervention including infection, bleeding, nerve injury, malunion, nonunion, hardware prominence, hardware failure, need for hardware removal, blood clots, cardiopulmonary complications, morbidity, mortality, among others, and they were willing to proceed.    OPERATIVE PROCEDURE:  The patient was brought to the operating room and placed in the supine position.  General anesthesia was administered, with a foley. He was placed on the fracture table.  Closed reduction was performed under C-arm guidance. The length of the femur was also measured using fluoroscopy. Time out was then performed after sterile prep and drape. He received preoperative antibiotics.  Incision was made proximal to the greater trochanter. A guidewire was placed in the appropriate position. Confirmation was made on AP and lateral views. The above-named nail was opened. I opened the proximal femur with a reamer. I then placed the nail by hand easily down. I did not need to ream the femur.  Once the nail was completely seated, I placed a guidepin into the femoral head into the center center position through a second  incision.  I measured the length, and then reamed the lateral cortex and up into the head. I then placed the helical blade. Slight compression was applied. A single distal locking bolt was placed through the guide. Anatomic fixation achieved. Bone quality was poor.  I then secured the proximal interlock.  I then removed the instruments, and took final C-arm pictures AP and lateral the entire length of the leg. Anatomic reconstruction was achieved, and the wounds were irrigated copiously and closed with Vicryl  followed by staples and dry sterile dressing. Sponge and needle count were correct.   The patient was awakened and returned to PACU in stable and satisfactory condition. There no complications and the patient tolerated the procedure well.  He will be weightbearing as tolerated.    Lyndle Herrlich

## 2020-11-25 NOTE — Anesthesia Postprocedure Evaluation (Signed)
Anesthesia Post Note  Patient: James Sanford  Procedure(s) Performed: INTRAMEDULLARY (IM) NAIL INTERTROCHANTRIC (Left)  Patient location during evaluation: PACU Anesthesia Type: General Level of consciousness: awake and alert Pain management: pain level controlled Vital Signs Assessment: post-procedure vital signs reviewed and stable Respiratory status: spontaneous breathing, nonlabored ventilation, respiratory function stable and patient connected to nasal cannula oxygen Cardiovascular status: blood pressure returned to baseline and stable Postop Assessment: no apparent nausea or vomiting Anesthetic complications: no   No notable events documented.   Last Vitals:  Vitals:   11/25/20 1530 11/25/20 1545  BP: 104/81 102/75  Pulse:    Resp: 18 12  Temp:    SpO2: 97% 96%    Last Pain:  Vitals:   11/25/20 1545  TempSrc:   PainSc: 0-No pain                 Corinda Gubler

## 2020-11-26 ENCOUNTER — Encounter: Payer: Self-pay | Admitting: Orthopedic Surgery

## 2020-11-26 DIAGNOSIS — N390 Urinary tract infection, site not specified: Secondary | ICD-10-CM | POA: Diagnosis not present

## 2020-11-26 DIAGNOSIS — F321 Major depressive disorder, single episode, moderate: Secondary | ICD-10-CM | POA: Diagnosis not present

## 2020-11-26 DIAGNOSIS — S72142A Displaced intertrochanteric fracture of left femur, initial encounter for closed fracture: Secondary | ICD-10-CM | POA: Diagnosis not present

## 2020-11-26 LAB — CBC
HCT: 33.3 % — ABNORMAL LOW (ref 39.0–52.0)
Hemoglobin: 10.9 g/dL — ABNORMAL LOW (ref 13.0–17.0)
MCH: 32.7 pg (ref 26.0–34.0)
MCHC: 32.7 g/dL (ref 30.0–36.0)
MCV: 100 fL (ref 80.0–100.0)
Platelets: 134 10*3/uL — ABNORMAL LOW (ref 150–400)
RBC: 3.33 MIL/uL — ABNORMAL LOW (ref 4.22–5.81)
RDW: 16.8 % — ABNORMAL HIGH (ref 11.5–15.5)
WBC: 11 10*3/uL — ABNORMAL HIGH (ref 4.0–10.5)
nRBC: 0 % (ref 0.0–0.2)

## 2020-11-26 LAB — GLUCOSE, CAPILLARY
Glucose-Capillary: 120 mg/dL — ABNORMAL HIGH (ref 70–99)
Glucose-Capillary: 152 mg/dL — ABNORMAL HIGH (ref 70–99)
Glucose-Capillary: 121 mg/dL — ABNORMAL HIGH (ref 70–99)
Glucose-Capillary: 147 mg/dL — ABNORMAL HIGH (ref 70–99)

## 2020-11-26 LAB — BASIC METABOLIC PANEL
Anion gap: 10 (ref 5–15)
BUN: 29 mg/dL — ABNORMAL HIGH (ref 8–23)
CO2: 27 mmol/L (ref 22–32)
Calcium: 8.2 mg/dL — ABNORMAL LOW (ref 8.9–10.3)
Chloride: 100 mmol/L (ref 98–111)
Creatinine, Ser: 0.75 mg/dL (ref 0.61–1.24)
GFR, Estimated: >60 mL/min (ref >60)
Glucose, Bld: 171 mg/dL — ABNORMAL HIGH (ref 70–99)
Potassium: 3.6 mmol/L (ref 3.5–5.1)
Sodium: 137 mmol/L (ref 135–145)

## 2020-11-26 MED ORDER — APIXABAN 5 MG PO TABS
5.0000 mg | ORAL_TABLET | Freq: Two times a day (BID) | ORAL | Status: DC
  Administered 2020-11-26 – 2020-12-27 (×63): 5 mg via ORAL
  Filled 2020-11-26 (×62): qty 1

## 2020-11-26 MED ORDER — HYDROCODONE-ACETAMINOPHEN 5-325 MG PO TABS: 1.0000 | ORAL_TABLET | Freq: Four times a day (QID) | ORAL | 0 refills | Status: DC | PRN

## 2020-11-26 NOTE — Progress Notes (Signed)
Subjective:  Patient reports pain as mild.    Objective:   VITALS:   Vitals:   11/25/20 1614 11/25/20 1958 11/25/20 2315 11/26/20 0548  BP: 107/76 107/81 115/84 115/88  Pulse: (!) 58 72 68 75  Resp: 15 17 17 17   Temp: 98.3 F (36.8 C) 98.2 F (36.8 C) 97.8 F (36.6 C) 97.9 F (36.6 C)  TempSrc:      SpO2: 100%  100% 100%  Weight:      Height:        PHYSICAL EXAM:  Neurologically intact ABD soft Neurovascular intact Sensation intact distally Intact pulses distally Dorsiflexion/Plantar flexion intact Incision: dressing C/D/I No cellulitis present Compartment soft  LABS  Results for orders placed or performed during the hospital encounter of 11/23/20 (from the past 24 hour(s))  Glucose, capillary     Status: Abnormal   Collection Time: 11/25/20  7:41 AM  Result Value Ref Range   Glucose-Capillary 122 (H) 70 - 99 mg/dL   Comment 1 Notify RN   Glucose, capillary     Status: None   Collection Time: 11/25/20 11:55 AM  Result Value Ref Range   Glucose-Capillary 83 70 - 99 mg/dL  Glucose, capillary     Status: None   Collection Time: 11/25/20  1:04 PM  Result Value Ref Range   Glucose-Capillary 87 70 - 99 mg/dL  Glucose, capillary     Status: None   Collection Time: 11/25/20  3:26 PM  Result Value Ref Range   Glucose-Capillary 96 70 - 99 mg/dL  Glucose, capillary     Status: None   Collection Time: 11/25/20  4:27 PM  Result Value Ref Range   Glucose-Capillary 76 70 - 99 mg/dL  Glucose, capillary     Status: Abnormal   Collection Time: 11/25/20  9:00 PM  Result Value Ref Range   Glucose-Capillary 180 (H) 70 - 99 mg/dL   Comment 1 Notify RN     DG HIP OPERATIVE UNILAT W OR W/O PELVIS LEFT  Result Date: 11/25/2020 CLINICAL DATA:  Left IM nail. EXAM: OPERATIVE LEFT HIP (WITH PELVIS IF PERFORMED) TECHNIQUE: Fluoroscopic spot image(s) were submitted for interpretation post-operatively. COMPARISON:  Preoperative radiographs yesterday. FINDINGS: Four fluoroscopic  spot views of the left hip obtained in the operating room. Intramedullary nail attention comparison distal locking screw transverse intertrochanteric femur fracture. Fluoroscopy time 44 seconds. Dose not provided. IMPRESSION: Procedural fluoroscopy for ORIF left hip fracture. Electronically Signed   By: 11/27/2020 M.D.   On: 11/25/2020 16:01   DG Hip Unilat W or Wo Pelvis 2-3 Views Left  Result Date: 11/24/2020 CLINICAL DATA:  Fall, pain EXAM: DG HIP (WITH OR WITHOUT PELVIS) 2-3V LEFT COMPARISON:  None. FINDINGS: Displaced, impacted intertrochanteric fractures of the proximal left femur. No other displaced fracture or dislocation of the pelvis or proximal right femur seen in frontal view only. IMPRESSION: Displaced, impacted intertrochanteric fractures of the proximal left femur. Electronically Signed   By: 11/26/2020 M.D.   On: 11/24/2020 13:39    Assessment/Plan: 1 Day Post-Op   Principal Problem:   Moderate major depression, single episode (HCC) Active Problems:   Alcohol abuse   Diabetes mellitus type 2, uncomplicated (HCC)   Hypertension   Intertrochanteric fracture of left femur, closed, initial encounter (HCC)   Acute lower UTI   Protein-calorie malnutrition, severe   Advance diet Up with therapy Discharge per medicine WBAT LLE Continue Eliqus Hydrocodone for pain, RX in chart  Follow up in office Tuesday 12/09/20  for staple removal Call to confirm appointment [phone]   Altamese Cabal , PA-C 11/26/2020, 6:44 AM

## 2020-11-26 NOTE — Progress Notes (Addendum)
PROGRESS NOTE    James Sanford  WUJ:[id number] DOB: 12-26-1955 DOA: 11/23/2020 PCP: Jerl Mina, MD   Assessment & Plan:   Principal Problem:   Moderate major depression, single episode (HCC) Active Problems:   Alcohol abuse   Diabetes mellitus type 2, uncomplicated (HCC)   Hypertension   Intertrochanteric fracture of left femur, closed, initial encounter (HCC)   Acute lower UTI   Protein-calorie malnutrition, severe   Moderate major depression:  as per psych. W/ suicidal ideation. Does not have any insight to his condition. Pt's wife has recordings of multiple threats that he has made and a plan to use a handgun. Pt has been seen by psychiatry who recommends an antidepressant but patient has refused to take the antidepressant until 2 days ago. Discussed with psychiatry and he does not need one-to-one precautions at this time but will need to be discharged to facility with [facility] psych for stabilization. May need to file involuntary commitment at some point if he continues to be resistant to treatment as per psych   Left hip fracture: s/p left intramedullary nail intertrochanteric as per ortho surg. Norco, morphine prn. PT/OT consulted   UTI: urine cx is pending. Continue on IV rocephin    Likely PAF: continue on metoprolol. Continue to hold eliquis for planned procedure  Alcohol abuse: continue on CIWA protocol. Continue on multivitamin, folic & thiamine    HTN: continue on metoprolol. Lisinopril was d/c    DM2: likely well controlled. Continue on SSI w/ accuchecks  Thrombocytopenia: etiology unclear. Will continue to monitor   Leukocytosis: reactive vs infection. Continue on IV abxs  DVT prophylaxis: eliquis  Code Status: full  Family Communication: discussed pt's care w/ pt's wife, [name], and answered her questions Disposition Plan: unclear, geri psych vs SNF  Level of care: Med-Surg  Status is: Inpatient  Remains inpatient appropriate because:Unsafe d/c plan  and Inpatient level of care appropriate due to severity of illness  Dispo: The patient is from: Home              Anticipated d/c is to: SNF              Patient currently is not medically stable to d/c.   Difficult to place patient: unclear    Consultants:    Procedures:   Antimicrobials:    Subjective: Pt c/o hip pain   Objective: Vitals:   11/25/20 2315 11/26/20 0548 11/26/20 0754 11/26/20 1242  BP: 115/84 115/88 (!) 126/91 110/79  Pulse: 68 75 77 75  Resp: 17 17 16 14   Temp: 97.8 F (36.6 C) 97.9 F (36.6 C) 98 F (36.7 C) 98.3 F (36.8 C)  TempSrc:      SpO2: 100% 100% 100% 100%  Weight:      Height:        Intake/Output Summary (Last 24 hours) at 11/26/2020 1402 Last data filed at 11/26/2020 1021 Gross per 24 hour  Intake 1465.39 ml  Output 825 ml  Net 640.39 ml   Filed Weights   11/23/20 1438  Weight: 77.1 kg    Examination:  General exam: Appears calm and comfortable  Respiratory system: Clear to auscultation. Respiratory effort normal. Cardiovascular system: S1 & S2+. No rubs, gallops or clicks.  Gastrointestinal system: Abdomen is nondistended, soft and nontender. Normal bowel sounds heard. Central nervous system: Alert and oriented. Moves all extremities Psychiatry: Judgement and insight appear normal. Flat mood and affect     Data Reviewed: I have personally reviewed following labs  and imaging studies  CBC: Recent Labs  Lab 11/23/20 1440 11/26/20 0944  WBC 6.6 11.0*  HGB 13.0 10.9*  HCT 38.2* 33.3*  MCV 98.7 100.0  PLT 148* 134*   Basic Metabolic Panel: Recent Labs  Lab 11/23/20 1440 11/26/20 0944  NA 136 137  K 4.7 3.6  CL 101 100  CO2 26 27  GLUCOSE 155* 171*  BUN 14 29*  CREATININE 0.74 0.75  CALCIUM 8.1* 8.2*   GFR: Estimated Creatinine Clearance: 103.1 mL/min (by C-G formula based on SCr of 0.75 mg/dL). Liver Function Tests: No results for input(s): AST, ALT, ALKPHOS, BILITOT, PROT, ALBUMIN in the last 168  hours. No results for input(s): LIPASE, AMYLASE in the last 168 hours. No results for input(s): AMMONIA in the last 168 hours. Coagulation Profile: No results for input(s): INR, PROTIME in the last 168 hours. Cardiac Enzymes: No results for input(s): CKTOTAL, CKMB, CKMBINDEX, TROPONINI in the last 168 hours. BNP (last 3 results) No results for input(s): PROBNP in the last 8760 hours. HbA1C: Recent Labs    11/23/20 1447  HGBA1C 5.3   CBG: Recent Labs  Lab 11/25/20 1526 11/25/20 1627 11/25/20 2100 11/26/20 0753 11/26/20 1234  GLUCAP 96 76 180* 120* 152*   Lipid Profile: No results for input(s): CHOL, HDL, LDLCALC, TRIG, CHOLHDL, LDLDIRECT in the last 72 hours. Thyroid Function Tests: No results for input(s): TSH, T4TOTAL, FREET4, T3FREE, THYROIDAB in the last 72 hours. Anemia Panel: No results for input(s): VITAMINB12, FOLATE, FERRITIN, TIBC, IRON, RETICCTPCT in the last 72 hours. Sepsis Labs: No results for input(s): PROCALCITON, LATICACIDVEN in the last 168 hours.  Recent Results (from the past 240 hour(s))  Resp Panel by RT-PCR (Flu A&B, Covid) Nasopharyngeal Swab     Status: None   Collection Time: 11/24/20  4:21 AM   Specimen: Nasopharyngeal Swab; Nasopharyngeal(NP) swabs in vial transport medium  Result Value Ref Range Status   SARS Coronavirus 2 by RT PCR NEGATIVE NEGATIVE Final    Comment: (NOTE) SARS-CoV-2 target nucleic acids are NOT DETECTED.  The SARS-CoV-2 RNA is generally detectable in upper respiratory specimens during the acute phase of infection. The lowest concentration of SARS-CoV-2 viral copies this assay can detect is 138 copies/mL. A negative result does not preclude SARS-Cov-2 infection and should not be used as the sole basis for treatment or other patient management decisions. A negative result may occur with  improper specimen collection/handling, submission of specimen other than nasopharyngeal swab, presence of viral mutation(s) within  the areas targeted by this assay, and inadequate number of viral copies(<138 copies/mL). A negative result must be combined with clinical observations, patient history, and epidemiological information. The expected result is Negative.  Fact Sheet for Patients:  BloggerCourse.com  Fact Sheet for Healthcare Providers:  SeriousBroker.it  This test is no t yet approved or cleared by the [place] FDA and  has been authorized for detection and/or diagnosis of SARS-CoV-2 by FDA under an Emergency Use Authorization (EUA). This EUA will remain  in effect (meaning this test can be used) for the duration of the COVID-19 declaration under Section 564(b)(1) of the Act, 21 U.S.C.section 360bbb-3(b)(1), unless the authorization is terminated  or revoked sooner.       Influenza A by PCR NEGATIVE NEGATIVE Final   Influenza B by PCR NEGATIVE NEGATIVE Final    Comment: (NOTE) The Xpert Xpress SARS-CoV-2/FLU/RSV plus assay is intended as an aid in the diagnosis of influenza from Nasopharyngeal swab specimens and should not be used as  a sole basis for treatment. Nasal washings and aspirates are unacceptable for Xpert Xpress SARS-CoV-2/FLU/RSV testing.  Fact Sheet for Patients: BloggerCourse.com  Fact Sheet for Healthcare Providers: SeriousBroker.it  This test is not yet approved or cleared by the [place] FDA and has been authorized for detection and/or diagnosis of SARS-CoV-2 by FDA under an Emergency Use Authorization (EUA). This EUA will remain in effect (meaning this test can be used) for the duration of the COVID-19 declaration under Section 564(b)(1) of the Act, 21 U.S.C. section 360bbb-3(b)(1), unless the authorization is terminated or revoked.  Performed at [facility], [address] [place], [place] [address]          Radiology Studies: DG HIP OPERATIVE  Lucienne Capers OR W/O PELVIS LEFT  Result Date: 11/25/2020 CLINICAL DATA:  Left IM nail. EXAM: OPERATIVE LEFT HIP (WITH PELVIS IF PERFORMED) TECHNIQUE: Fluoroscopic spot image(s) were submitted for interpretation post-operatively. COMPARISON:  Preoperative radiographs yesterday. FINDINGS: Four fluoroscopic spot views of the left hip obtained in the operating room. Intramedullary nail attention comparison distal locking screw transverse intertrochanteric femur fracture. Fluoroscopy time 44 seconds. Dose not provided. IMPRESSION: Procedural fluoroscopy for ORIF left hip fracture. Electronically Signed   By: Narda Rutherford M.D.   On: 11/25/2020 16:01        Scheduled Meds:  apixaban  5 mg Oral BID   docusate sodium  100 mg Oral BID   FLUoxetine  20 mg Oral Daily   folic acid  1 mg Oral Daily   insulin aspart  0-15 Units Subcutaneous TID WC   LORazepam  0-4 mg Intravenous Q12H   Or   LORazepam  0-4 mg Oral Q12H   metoprolol succinate  25 mg Oral Daily   multivitamin with minerals  1 tablet Oral Daily   thiamine  100 mg Oral Daily   Continuous Infusions:  cefTRIAXone (ROCEPHIN)  IV Stopped (11/24/20 1617)   methocarbamol (ROBAXIN) IV       LOS: 2 days    Time spent: 33 mins     Charise Killian, MD [facility] Pager [phone]  If 7PM-7AM, please contact night-coverage 11/26/2020, 2:02 PM

## 2020-11-26 NOTE — Progress Notes (Signed)
Physical Therapy Treatment Patient Details Name: James Sanford MRN: [id number] DOB: Jun 07, 1955 Today's Date: 11/26/2020    History of Present Illness Per MD notes, pt is a 65 y.o. male who presented to the ER via EMS for evaluation of a fall with a left intertrochanteric hip fx and is currently s/p IM nail WBAT LLE. PMH includes alcohol abuse, diabetes mellitus, hypertension, sleep apnea, paroxysmal atrial fibrillation on anticoagulation. MD assessment includes severe depression with suicidal ideation, UTI, alcohol abuse, and displaced, impacted intertrochanteric fracture of the left femur.    PT Comments    Pt was pleasant and motivated to participate during the session. Pt gave good effort and vitals were stable throughout session. Pt able to perform seated therex but limited with overall LLE AROM. Pt demonstrated poor eccentric control performing stand>sit to recliner. Pt able to ambulate further distance than last session but still limited activity tolerance secondary to fatigue and generalized weakness. Pt will benefit from PT services in a SNF setting upon discharge to safely address deficits listed in patient problem list for decreased caregiver assistance and eventual return to PLOF.     Follow Up Recommendations  SNF;Supervision for mobility/OOB     Equipment Recommendations  None recommended by PT    Recommendations for Other Services       Precautions / Restrictions Precautions Precautions: Fall Restrictions Weight Bearing Restrictions: Yes LLE Weight Bearing: Weight bearing as tolerated    Mobility  Bed Mobility    General bed mobility comments: NT, pt in recliner upon arrival and left in chair    Transfers Overall transfer level: Needs assistance Equipment used: Rolling walker (2 wheeled) Transfers: Sit to/from Stand Sit to Stand: Min guard;From elevated surface         General transfer comment: Verbal cues for  sequencing.  Ambulation/Gait Ambulation/Gait assistance: Min guard Gait Distance (Feet): 80 Feet Assistive device: Rolling walker (2 wheeled) Gait Pattern/deviations: Step-through pattern;Decreased step length - right;Decreased stance time - left;Decreased stride length Gait velocity: decreased   General Gait Details: Verbal cues for sequencing and walker placement. Pt was overall steady using RW with no LOB.   Stairs             Wheelchair Mobility    Modified Rankin (Stroke Patients Only)       Balance Overall balance assessment: Needs assistance Sitting-balance support: No upper extremity supported;Feet supported Sitting balance-Leahy Scale: Good Sitting balance - Comments: Supervision at EOB   Standing balance support: Bilateral upper extremity supported;During functional activity Standing balance-Leahy Scale: Fair Standing balance comment: Reliance on UE support through RW.                            Cognition Arousal/Alertness: Awake/alert Behavior During Therapy: WFL for tasks assessed/performed Overall Cognitive Status: Within Functional Limits for tasks assessed                                        Exercises Total Joint Exercises Long Arc Quad: AROM;Strengthening;Both;10 reps;Seated Marching in Standing: AROM;Strengthening;Both;10 reps;Standing (x10 standing; x10 seated with very limited ROM for LLE) Other Exercises Other Exercises: HEP reviewed verbally and via handout and he demonstrated understanding. Other Exercises: Static standing 1-71min with min guard for improved activity tolerance.    General Comments        Pertinent Vitals/Pain Pain Assessment: 0-10 Pain Score: 8  Pain Location: Left hip Pain Descriptors / Indicators: Aching;Sore Pain Intervention(s): Limited activity within patient's tolerance;Monitored during session;Premedicated before session;Repositioned    Home Living Family/patient expects to be  discharged to:: Private residence Living Arrangements: Spouse/significant other Available Help at Discharge: Family;Available 24 hours/day Type of Home: House Home Access: Ramped entrance   Home Layout: Two level;1/2 bath on main level Home Equipment: Walker - 2 wheels;Bedside commode;Wheelchair - manual      Prior Function Level of Independence: Independent      Comments: Reports being independent with ambulation and all ADLs. 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 Reports only 2 falls within the last year   PT Goals (current goals can now be found in the care plan section) Acute Rehab PT Goals Patient Stated Goal: Get back to work PT Goal Formulation: With patient Time For Goal Achievement: 12/09/20 Potential to Achieve Goals: Good Progress towards PT goals: Progressing toward goals    Frequency    BID      PT Plan      Co-evaluation              AM-PAC PT "6 Clicks" Mobility   Outcome Measure  Help needed turning from your back to your side while in a flat bed without using bedrails?: None Help needed moving from lying on your back to sitting on the side of a flat bed without using bedrails?: A Little Help needed moving to and from a bed to a chair (including a wheelchair)?: A Little Help needed standing up from a chair using your arms (e.g., wheelchair or bedside chair)?: A Little Help needed to walk in hospital room?: A Little Help needed climbing 3-5 steps with a railing? : A Lot 6 Click Score: 18    End of Session Equipment Utilized During Treatment: Gait belt Activity Tolerance: Patient tolerated treatment well Patient left: in chair;with call bell/phone within reach;with chair alarm set;with SCD's reapplied;with nursing/sitter in room;Other (comment) (heels floating via pillow) Nurse Communication: Mobility status;Weight bearing status PT Visit Diagnosis: Unsteadiness on feet (R26.81);Other abnormalities of gait and mobility (R26.89);Muscle weakness  (generalized) (M62.81);History of falling (Z91.81);Pain Pain - Right/Left: Left Pain - part of body: Hip     Time: 1433-1500 PT Time Calculation (min) (ACUTE ONLY): 27 min  Charges:  $Therapeutic Exercise: 8-22 mins                     Desiree Hane SPT 11/26/20, 4:50 PM

## 2020-11-26 NOTE — Evaluation (Signed)
Physical Therapy Evaluation Patient Details Name: James Sanford MRN: [id number] DOB: Sep 14, 1955 Today's Date: 11/26/2020   History of Present Illness  Per MD notes, pt is a 65 y.o. male who presented to the ER via EMS for evaluation of a fall with a left intertrochanteric hip fx and is currently s/p IM nail WBAT LLE. PMH includes alcohol abuse, diabetes mellitus, hypertension, sleep apnea, paroxysmal atrial fibrillation on anticoagulation. MD assessment includes severe depression with suicidal ideation, UTI, alcohol abuse, and displaced, impacted intertrochanteric fracture of the left femur.  Clinical Impression  Pt was pleasant and motivated to participate during the session. Pt gave good effort and vitals were stable throughout session. Pt performed all functional mobility with supervision to min A. Pt was limited with ambulation secondary to reported fatigue. Pt reported decreased pain after physical activity. Will attempt a longer distance walk next visit as appropriate. Pt will benefit from PT services in a SNF setting upon discharge to safely address deficits listed in patient problem list for decreased caregiver assistance and eventual return to PLOF.      Follow Up Recommendations SNF;Supervision for mobility/OOB    Equipment Recommendations  None recommended by PT    Recommendations for Other Services       Precautions / Restrictions Precautions Precautions: Fall Restrictions Weight Bearing Restrictions: Yes LLE Weight Bearing: Weight bearing as tolerated      Mobility  Bed Mobility Overal bed mobility: Needs Assistance Bed Mobility: Supine to Sit     Supine to sit: Min assist;HOB elevated     General bed mobility comments: Verbal cues for sequencing. Min A for surgical leg.    Transfers Overall transfer level: Needs assistance Equipment used: Rolling walker (2 wheeled) Transfers: Sit to/from Stand Sit to Stand: Min guard;From elevated surface          General transfer comment: Verbal cues for sequencing. Good concentric eccentric control.  Ambulation/Gait Ambulation/Gait assistance: Min guard Gait Distance (Feet): 5 Feet Assistive device: Rolling walker (2 wheeled) Gait Pattern/deviations: Step-through pattern;Decreased step length - right;Decreased stance time - left;Decreased stride length Gait velocity: decreased   General Gait Details: Verbal cues for sequencing and walker placement. Pt was overall steady using RW with no LOB.  Stairs            Wheelchair Mobility    Modified Rankin (Stroke Patients Only)       Balance Overall balance assessment: Needs assistance Sitting-balance support: Feet unsupported;No upper extremity supported Sitting balance-Leahy Scale: Good Sitting balance - Comments: Supervision at EOB   Standing balance support: Bilateral upper extremity supported;During functional activity Standing balance-Leahy Scale: Fair Standing balance comment: Reliance on UE support through RW.                             Pertinent Vitals/Pain Pain Assessment: 0-10 Pain Score: 9  (Initially 9/10 but decreased to 2/10 while walking) Pain Location: Left hip Pain Descriptors / Indicators: Aching;Sore Pain Intervention(s): Limited activity within patient's tolerance;Monitored during session;Premedicated before session;Repositioned    Home Living Family/patient expects to be discharged to:: Private residence Living Arrangements: Spouse/significant other Available Help at Discharge: Family;Available 24 hours/day Type of Home: House Home Access: Ramped entrance     Home Layout: Two level;1/2 bath on main level Home Equipment: Walker - 2 wheels;Bedside commode;Wheelchair - manual      Prior Function Level of Independence: Independent         Comments: Reports being independent with  ambulation and all ADLs. 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 Reports only 2 falls within the last year      Hand Dominance   Dominant Hand: Right    Extremity/Trunk Assessment   Upper Extremity Assessment Upper Extremity Assessment: Overall WFL for tasks assessed    Lower Extremity Assessment Lower Extremity Assessment: Generalized weakness;LLE deficits/detail LLE: Unable to fully assess due to pain    Cervical / Trunk Assessment Cervical / Trunk Assessment: Normal  Communication   Communication: No difficulties  Cognition Arousal/Alertness: Awake/alert Behavior During Therapy: WFL for tasks assessed/performed Overall Cognitive Status: Within Functional Limits for tasks assessed                                        General Comments      Exercises Total Joint Exercises Ankle Circles/Pumps: AROM;Strengthening;Both;10 reps;Supine Quad Sets: AROM;Strengthening;Both;10 reps;Supine Gluteal Sets: AROM;Strengthening;Both;10 reps;Supine Heel Slides: AROM;Strengthening;Left;10 reps;Supine Hip ABduction/ADduction: AROM;Strengthening;Left;10 reps;Supine Straight Leg Raises: Strengthening;Left;5 reps;Supine;AAROM Long [employer]: AROM;Strengthening;Both;10 reps;Seated Marching in Standing: AROM;Strengthening;Both;10 reps;Standing Other Exercises Other Exercises: HEP provided verbally and via handout and he demonstrated understanding. Other Exercises: Static standing 1-61min with min guard for improved activity tolerance.   Assessment/Plan    PT Assessment Patient needs continued PT services  PT Problem List Decreased strength;Decreased activity tolerance;Decreased balance;Decreased mobility;Decreased knowledge of use of DME;Decreased safety awareness;Decreased knowledge of precautions;Pain       PT Treatment Interventions DME instruction;Gait training;Stair training;Functional mobility training;Therapeutic activities;Therapeutic exercise;Balance training;Patient/family education    PT Goals (Current goals can be found in the Care Plan section)  Acute Rehab PT  Goals Patient Stated Goal: Get back to work PT Goal Formulation: With patient Time For Goal Achievement: 12/09/20 Potential to Achieve Goals: Good    Frequency BID   Barriers to discharge        Co-evaluation               AM-PAC PT "6 Clicks" Mobility  Outcome Measure Help needed turning from your back to your side while in a flat bed without using bedrails?: None Help needed moving from lying on your back to sitting on the side of a flat bed without using bedrails?: A Little Help needed moving to and from a bed to a chair (including a wheelchair)?: A Little Help needed standing up from a chair using your arms (e.g., wheelchair or bedside chair)?: A Little Help needed to walk in hospital room?: A Little Help needed climbing 3-5 steps with a railing? : A Lot 6 Click Score: 18    End of Session Equipment Utilized During Treatment: Gait belt Activity Tolerance: Patient tolerated treatment well Patient left: in chair;with call bell/phone within reach;with chair alarm set;with SCD's reapplied Nurse Communication: Mobility status;Weight bearing status PT Visit Diagnosis: Unsteadiness on feet (R26.81);Other abnormalities of gait and mobility (R26.89);Muscle weakness (generalized) (M62.81);History of falling (Z91.81);Pain Pain - Right/Left: Left Pain - part of body: Hip    Time: 4098-1191 PT Time Calculation (min) (ACUTE ONLY): 39 min   Charges:              Desiree Hane SPT 11/26/20, 1:26 PM

## 2020-11-27 DIAGNOSIS — I10 Essential (primary) hypertension: Secondary | ICD-10-CM | POA: Diagnosis not present

## 2020-11-27 DIAGNOSIS — S72142A Displaced intertrochanteric fracture of left femur, initial encounter for closed fracture: Secondary | ICD-10-CM | POA: Diagnosis not present

## 2020-11-27 DIAGNOSIS — F321 Major depressive disorder, single episode, moderate: Secondary | ICD-10-CM | POA: Diagnosis not present

## 2020-11-27 LAB — CBC
HCT: 30.3 % — ABNORMAL LOW (ref 39.0–52.0)
Hemoglobin: 10.1 g/dL — ABNORMAL LOW (ref 13.0–17.0)
MCH: 33.6 pg (ref 26.0–34.0)
MCHC: 33.3 g/dL (ref 30.0–36.0)
MCV: 100.7 fL — ABNORMAL HIGH (ref 80.0–100.0)
Platelets: 135 10*3/uL — ABNORMAL LOW (ref 150–400)
RBC: 3.01 MIL/uL — ABNORMAL LOW (ref 4.22–5.81)
RDW: 16.5 % — ABNORMAL HIGH (ref 11.5–15.5)
WBC: 7.3 10*3/uL (ref 4.0–10.5)
nRBC: 0 % (ref 0.0–0.2)

## 2020-11-27 LAB — BASIC METABOLIC PANEL
Anion gap: 7 (ref 5–15)
BUN: 33 mg/dL — ABNORMAL HIGH (ref 8–23)
CO2: 31 mmol/L (ref 22–32)
Calcium: 7.6 mg/dL — ABNORMAL LOW (ref 8.9–10.3)
Chloride: 102 mmol/L (ref 98–111)
Creatinine, Ser: 0.69 mg/dL (ref 0.61–1.24)
GFR, Estimated: >60 mL/min (ref >60)
Glucose, Bld: 106 mg/dL — ABNORMAL HIGH (ref 70–99)
Potassium: 3.8 mmol/L (ref 3.5–5.1)
Sodium: 140 mmol/L (ref 135–145)

## 2020-11-27 LAB — GLUCOSE, CAPILLARY
Glucose-Capillary: 99 mg/dL (ref 70–99)
Glucose-Capillary: 103 mg/dL — ABNORMAL HIGH (ref 70–99)
Glucose-Capillary: 111 mg/dL — ABNORMAL HIGH (ref 70–99)
Glucose-Capillary: 129 mg/dL — ABNORMAL HIGH (ref 70–99)

## 2020-11-27 LAB — URINE CULTURE: Culture: <10000 — AB

## 2020-11-27 NOTE — Plan of Care (Signed)
Pt reports pain as 0/10 this shift while lying in bed. No acute events overnight. Problem: Education: Goal: Verbalization of understanding the information provided (i.e., activity precautions, restrictions, etc) will improve Outcome: Progressing Goal: Individualized Educational Video(s) Outcome: Progressing   Problem: Activity: Goal: Ability to ambulate and perform ADLs will improve Outcome: Progressing   Problem: Clinical Measurements: Goal: Postoperative complications will be avoided or minimized Outcome: Progressing   Problem: Self-Concept: Goal: Ability to maintain and perform role responsibilities to the fullest extent possible will improve Outcome: Progressing   Problem: Pain Management: Goal: Pain level will decrease Outcome: Progressing   Problem: Education: Goal: Knowledge of General Education information will improve Description: Including pain rating scale, medication(s)/side effects and non-pharmacologic comfort measures Outcome: Progressing   Problem: Health Behavior/Discharge Planning: Goal: Ability to manage health-related needs will improve Outcome: Progressing   Problem: Clinical Measurements: Goal: Ability to maintain clinical measurements within normal limits will improve Outcome: Progressing Goal: Will remain free from infection Outcome: Progressing Goal: Diagnostic test results will improve Outcome: Progressing Goal: Respiratory complications will improve Outcome: Progressing Goal: Cardiovascular complication will be avoided Outcome: Progressing   Problem: Activity: Goal: Risk for activity intolerance will decrease Outcome: Progressing   Problem: Nutrition: Goal: Adequate nutrition will be maintained Outcome: Progressing   Problem: Coping: Goal: Level of anxiety will decrease Outcome: Progressing   Problem: Elimination: Goal: Will not experience complications related to bowel motility Outcome: Progressing Goal: Will not experience  complications related to urinary retention Outcome: Progressing   Problem: Pain Managment: Goal: General experience of comfort will improve Outcome: Progressing   Problem: Safety: Goal: Ability to remain free from injury will improve Outcome: Progressing   Problem: Skin Integrity: Goal: Risk for impaired skin integrity will decrease Outcome: Progressing

## 2020-11-27 NOTE — Progress Notes (Signed)
Physical Therapy Treatment Patient Details Name: James Sanford MRN: [id number] DOB: Aug 20, 1955 Today's Date: 11/27/2020    History of Present Illness Per MD notes, pt is a 65 y.o. male who presented to the ER via EMS for evaluation of a fall with a left intertrochanteric hip fx and is currently s/p IM nail WBAT LLE. PMH includes alcohol abuse, diabetes mellitus, hypertension, sleep apnea, paroxysmal atrial fibrillation on anticoagulation. MD assessment includes severe depression with suicidal ideation, UTI, alcohol abuse, and displaced, impacted intertrochanteric fracture of the left femur.    PT Comments    Pt was pleasant and motivated to participate during the session. Pt's spouse was present throughout session. Pt's spouse reported that ramp to enter home and chair lift for stairs may not be reliable so step training provided this session. Pt performed stairs with min guard with overall steadiness ascending stairs and had some difficulty descending but no LOB. Further practice with stair navigation will be attempted next visit as appropriate. Pt primarily limited with OOB activities secondary to reported fatigue. Pt will benefit from HHPT upon discharge to safely address deficits listed in patient problem list for decreased caregiver assistance and eventual return to PLOF.     Follow Up Recommendations  Home health PT;Supervision for mobility/OOB     Equipment Recommendations  None recommended by PT    Recommendations for Other Services       Precautions / Restrictions Precautions Precautions: Fall Restrictions Weight Bearing Restrictions: No LLE Weight Bearing: Weight bearing as tolerated    Mobility  Bed Mobility Overal bed mobility: Needs Assistance Bed Mobility: Supine to Sit     Supine to sit: Supervision     General bed mobility comments: NT, pt in recliner upon arrival    Transfers Overall transfer level: Needs assistance Equipment used: Rolling walker  (2 wheeled) Transfers: Sit to/from Stand Sit to Stand: Min guard;From elevated surface         General transfer comment: Verbal cues for sequencing.  Ambulation/Gait Ambulation/Gait assistance: Min guard Gait Distance (Feet): 60 Feet x2 Assistive device: Rolling walker (2 wheeled) Gait Pattern/deviations: Step-through pattern;Decreased step length - right;Decreased stance time - left;Decreased stride length Gait velocity: decreased   General Gait Details: Verbal cues for sequencing and walker placement. Pt was overall steady using RW with no LOB.   Stairs Stairs: Yes Stairs assistance: Min guard Stair Management: One rail Right;Step to pattern;Forwards Number of Stairs: 4 (x2) General stair comments: Verbal cues for sequencing. Pt was steady ascending stair but had some difficulty descending. No LOB. Attempted stairs a 3rd time but unable to secondary to fatigue.   Wheelchair Mobility    Modified Rankin (Stroke Patients Only)       Balance Overall balance assessment: Needs assistance Sitting-balance support: No upper extremity supported;Feet supported Sitting balance-Leahy Scale: Good Sitting balance - Comments: Supervision at EOB   Standing balance support: Bilateral upper extremity supported;During functional activity Standing balance-Leahy Scale: Good Standing balance comment: Some reliance UE support through RW                            Cognition Arousal/Alertness: Awake/alert Behavior During Therapy: WFL for tasks assessed/performed Overall Cognitive Status: Within Functional Limits for tasks assessed                                        Exercises  Total Joint Exercises Marching in Standing: AROM;Strengthening;Both;10 reps;Standing Other Exercises Other Exercises: Static standing 1-2 min with min guard for improved activity tolerance. Other Exercises: Stair training provided verbally and via demonstration with wife present and  he demonstrated understanding.    General Comments        Pertinent Vitals/Pain Pain Assessment: 0-10 Pain Score: 2  Pain Location: Left hip Pain Descriptors / Indicators: Aching;Sore Pain Intervention(s): Monitored during session;Premedicated before session    Home Living                      Prior Function            PT Goals (current goals can now be found in the care plan section) Progress towards PT goals: Progressing toward goals    Frequency    BID      PT Plan Current plan remains appropriate    Co-evaluation              AM-PAC PT "6 Clicks" Mobility   Outcome Measure  Help needed turning from your back to your side while in a flat bed without using bedrails?: None Help needed moving from lying on your back to sitting on the side of a flat bed without using bedrails?: A Little Help needed moving to and from a bed to a chair (including a wheelchair)?: A Little Help needed standing up from a chair using your arms (e.g., wheelchair or bedside chair)?: A Little Help needed to walk in hospital room?: A Little Help needed climbing 3-5 steps with a railing? : A Little 6 Click Score: 19    End of Session Equipment Utilized During Treatment: Gait belt Activity Tolerance: Patient tolerated treatment well Patient left: in chair;with call bell/phone within reach;with chair alarm set;with SCD's reapplied;Other (comment);with family/visitor present Nurse Communication: Mobility status;Weight bearing status PT Visit Diagnosis: Unsteadiness on feet (R26.81);Other abnormalities of gait and mobility (R26.89);Muscle weakness (generalized) (M62.81);History of falling (Z91.81);Pain Pain - Right/Left: Left Pain - part of body: Hip     Time: 1430-1500 PT Time Calculation (min) (ACUTE ONLY): 30 min  Charges:  $Gait Training: 8-22 mins $Therapeutic Exercise: 8-22 mins                     Desiree Hane SPT 11/27/20, 5:08 PM

## 2020-11-27 NOTE — TOC Progression Note (Signed)
Transition of Care [facility]) - Progression Note    Patient Details  Name: James Sanford MRN: [id number] Date of Birth: 1955/12/06  [facility] CM/SW Contact  Caryn Section, RN Phone Number: 11/27/2020, 1:53 PM  Clinical Narrative:   RNCM in to see patient, states that he is aware PT recommended Home Health.   He is aware of the other disciplines seeing him at this time and has no further comment other than he feels comfortable returning home  RNCM spoke to wife earlier, spouse endorses all noted in psychiatry notes and states she is very concerned about bringing patient home and wants him to have help and experience holistic healing.  She states she has tried her best to assist patient, but remains very concerned about his wellbeing.  She is aware of the various disciplines consulted during this visit.  Awaiting further notes from psychiatry to determine disposition, as patient is an ortho patient, but psychiatry has recommended inpatient.           Expected Discharge Plan and Services                                                 Social Determinants of Health (SDOH) Interventions    Readmission Risk Interventions No flowsheet data found.

## 2020-11-27 NOTE — Consult Note (Signed)
[facility] Face-to-Face Psychiatry Consult   Reason for Consult: Follow-up for this 65 year old man admitted to the hospital with a broken hip but with concern for depression and alcohol abuse Referring Physician: Mayford Knife Patient Identification: Selso Mannor MRN:  [id number] Principal Diagnosis: Moderate major depression, single episode (HCC) Diagnosis:  Principal Problem:   Moderate major depression, single episode (HCC) Active Problems:   Alcohol abuse   Diabetes mellitus type 2, uncomplicated (HCC)   Hypertension   Intertrochanteric fracture of left femur, closed, initial encounter (HCC)   Acute lower UTI   Protein-calorie malnutrition, severe   Total Time spent with patient: 30 minutes  Subjective:   Alyas Creary is a 65 y.o. male patient admitted with mok".  HPI: Patient continues to be pleasant to most staff and to deny any suicidal ideation but to be very resistant to engaging in any conversation about his depression or behavior before coming into the hospital or to acknowledge substance abuse issues.  Refuses to take medication for depression.  He is cooperating with rehab treatment for his hip.  Wife has continued to tell staff that she feels unsafe with him coming home.  Past Psychiatric History: No past psychiatric treatment  Risk to Self:   Risk to Others:   Prior Inpatient Therapy:   Prior Outpatient Therapy:    Past Medical History:  Past Medical History:  Diagnosis Date   Arthritis    Diabetes (HCC)    Hyperlipemia    Hypertension    Sleep apnea     Past Surgical History:  Procedure Laterality Date   COLONOSCOPY WITH PROPOFOL N/A 01/28/2020   Procedure: COLONOSCOPY WITH PROPOFOL;  Surgeon: Toney Reil, MD;  Location: [facility] ENDOSCOPY;  Service: Gastroenterology;  Laterality: N/A;   COLONOSCOPY WITH PROPOFOL N/A 01/28/2020   Procedure: COLONOSCOPY WITH PROPOFOL;  Surgeon: Toney Reil, MD;  Location: [facility] ENDOSCOPY;  Service:  Gastroenterology;  Laterality: N/A;   ESOPHAGOGASTRODUODENOSCOPY (EGD) WITH PROPOFOL N/A 01/28/2020   Procedure: ESOPHAGOGASTRODUODENOSCOPY (EGD) WITH PROPOFOL;  Surgeon: Toney Reil, MD;  [facility] END[facility]COPY;  Service: Gastroenterology;  Laterality: N/A;   GASTRIC BYPASS     HERNIA REPAIR     INTRAMEDULLARY (IM) NAIL INTERTROCHANTERIC Left 11/25/2020   Procedure: INTRAMEDULLARY (IM) NAIL INTERTROCHANTRIC;  Surgeon: Lyndle Herrlich, MD;  Location: [facility] ORS;  Service: Orthopedics;  Laterality: Left;   JOINT REPLACEMENT Bilateral    Knee surgery   OPEN REDUCTION INTERNAL FIXATION (ORIF) DISTAL RADIAL FRACTURE Right 10/30/2017   Procedure: OPEN REDUCTION INTERNAL FIXATION (ORIF) DISTAL RADIAL FRACTURE;  Surgeon: Garnette Gunner, MD;  Location: [facility] ORS;  Service: Orthopedics;  Laterality: Right;   ORIF FEMUR FRACTURE Right 01/29/2020   Procedure: OPEN REDUCTION INTERNAL FIXATION (ORIF) DISTAL FEMUR FRACTURE;  Surgeon: Christena Flake, MD;  Location: [facility] ORS;  Service: Orthopedics;  Laterality: Right;   Family History:  Family History  Problem Relation Age of Onset   Stroke Mother    Suicidality Father    Family Psychiatric  History: Father killed himself Social History:  Social History   Substance and Sexual Activity  Alcohol Use Yes   Comment: beer occasionally     Social History   Substance and Sexual Activity  Drug Use Not Currently    Social History   Socioeconomic History   Marital status: Married    Spouse name: Not on file   Number of children: Not on file   Years of education: Not on file   Highest education level: Not on file  Occupational History   Not on file  Tobacco Use   Smoking status: Never   Smokeless tobacco: Former    Types: Chew   Tobacco comments:    quir about 4 years ago  Vaping Use   Vaping Use: Never used  Substance and Sexual Activity   Alcohol use: Yes    Comment: beer occasionally   Drug use: Not Currently   Sexual activity:  Not on file  Other Topics Concern   Not on file  Social History Narrative   Lives at home with his wife. Independent baseline   Social Determinants of Corporate investment bankerHealth   Financial Resource Strain: Not on file  Food Insecurity: Not on file  Transportation Needs: Not on file  Physical Activity: Not on file  Stress: Not on file  Social Connections: Not on file   Additional Social History:    Allergies:   Allergies  Allergen Reactions   Penicillins Diarrhea    TOLERATED CEFAZOLIN 11/25/20 Unknown childhood reaction    Labs:  Results for orders placed or performed during the hospital encounter of 11/23/20 (from the past 48 hour(s))  Glucose, capillary     Status: Abnormal   Collection Time: 11/25/20  9:00 PM  Result Value Ref Range   Glucose-Capillary 180 (H) 70 - 99 mg/dL    Comment: Glucose reference range applies only to samples taken after fasting for at least 8 hours.   Comment 1 Notify RN   Glucose, capillary     Status: Abnormal   Collection Time: 11/26/20  7:53 AM  Result Value Ref Range   Glucose-Capillary 120 (H) 70 - 99 mg/dL    Comment: Glucose reference range applies only to samples taken after fasting for at least 8 hours.  Urine Culture     Status: Abnormal   Collection Time: 11/26/20  8:39 AM   Specimen: Urine, Clean Catch  Result Value Ref Range   Specimen Description      URINE, CLEAN CATCH Performed at [facility], [address] [place], [place] [address]    Special Requests      NONE Performed at [facility], [address] [place], [place] [address]    Culture (A)     <10,000 COLONIES/mL INSIGNIFICANT GROWTH Performed at [facility], [address] [place], [place] [address]    Report Status 11/27/2020 FINAL   CBC     Status: Abnormal   Collection Time: 11/26/20  9:44 AM  Result Value Ref Range   WBC 11.0 (H) 4.0 - 10.5 K/uL   RBC 3.33 (L) 4.22 - 5.81 MIL/uL   Hemoglobin 10.9 (L) 13.0 - 17.0 g/dL   HCT 29.533.3 (L) 28.439.0 -  52.0 %   MCV 100.0 80.0 - 100.0 fL   MCH 32.7 26.0 - 34.0 pg   MCHC 32.7 30.0 - 36.0 g/dL   RDW 13.216.8 (H) 44.011.5 - 10.215.5 %   Platelets 134 (L) 150 - 400 K/uL   nRBC 0.0 0.0 - 0.2 %    Comment: Performed at [facility], [address] [place], [place] [address]  Basic metabolic panel     Status: Abnormal   Collection Time: 11/26/20  9:44 AM  Result Value Ref Range   Sodium 137 135 - 145 mmol/L   Potassium 3.6 3.5 - 5.1 mmol/L   Chloride 100 98 - 111 mmol/L   CO2 27 22 - 32 mmol/L   Glucose, Bld 171 (H) 70 - 99 mg/dL  Comment: Glucose reference range applies only to samples taken after fasting for at least 8 hours.   BUN 29 (H) 8 - 23 mg/dL   Creatinine, Ser 8.65 0.61 - 1.24 mg/dL   Calcium 8.2 (L) 8.9 - 10.3 mg/dL   GFR, Estimated >78 >46 mL/min    Comment: (NOTE) Calculated using the CKD-EPI Creatinine Equation (2021)    Anion gap 10 5 - 15    Comment: Performed at [facility], [address] [place], [place] [address]  Glucose, capillary     Status: Abnormal   Collection Time: 11/26/20 12:34 PM  Result Value Ref Range   Glucose-Capillary 152 (H) 70 - 99 mg/dL    Comment: Glucose reference range applies only to samples taken after fasting for at least 8 hours.  Glucose, capillary     Status: Abnormal   Collection Time: 11/26/20  4:25 PM  Result Value Ref Range   Glucose-Capillary 121 (H) 70 - 99 mg/dL    Comment: Glucose reference range applies only to samples taken after fasting for at least 8 hours.  Glucose, capillary     Status: Abnormal   Collection Time: 11/26/20  8:35 PM  Result Value Ref Range   Glucose-Capillary 147 (H) 70 - 99 mg/dL    Comment: Glucose reference range applies only to samples taken after fasting for at least 8 hours.   Comment 1 Notify RN   CBC     Status: Abnormal   Collection Time: 11/27/20  4:27 AM  Result Value Ref Range   WBC 7.3 4.0 - 10.5 K/uL   RBC 3.01 (L) 4.22 - 5.81 MIL/uL   Hemoglobin 10.1 (L) 13.0 - 17.0 g/dL    HCT 28.4 (L) 13.2 - 52.0 %   MCV 100.7 (H) 80.0 - 100.0 fL   MCH 33.6 26.0 - 34.0 pg   MCHC 33.3 30.0 - 36.0 g/dL   RDW 44.0 (H) 10.2 - 72.5 %   Platelets 135 (L) 150 - 400 K/uL   nRBC 0.0 0.0 - 0.2 %    Comment: Performed at [facility], [address], [place], [place] [address]  Basic metabolic panel     Status: Abnormal   Collection Time: 11/27/20  4:27 AM  Result Value Ref Range   Sodium 140 135 - 145 mmol/L   Potassium 3.8 3.5 - 5.1 mmol/L   Chloride 102 98 - 111 mmol/L   CO2 31 22 - 32 mmol/L   Glucose, Bld 106 (H) 70 - 99 mg/dL    Comment: Glucose reference range applies only to samples taken after fasting for at least 8 hours.   BUN 33 (H) 8 - 23 mg/dL   Creatinine, Ser 0.34 0.61 - 1.24 mg/dL   Calcium 7.6 (L) 8.9 - 10.3 mg/dL   GFR, Estimated >74 >25 mL/min    Comment: (NOTE) Calculated using the CKD-EPI Creatinine Equation (2021)    Anion gap 7 5 - 15    Comment: Performed at [facility], [address] [place], [place] [address]  Glucose, capillary     Status: None   Collection Time: 11/27/20  8:35 AM  Result Value Ref Range   Glucose-Capillary 99 70 - 99 mg/dL    Comment: Glucose reference range applies only to samples taken after fasting for at least 8 hours.  Glucose, capillary     Status: Abnormal   Collection Time: 11/27/20 11:52 AM  Result Value Ref Range   Glucose-Capillary 103 (H) 70 - 99  mg/dL    Comment: Glucose reference range applies only to samples taken after fasting for at least 8 hours.  Glucose, capillary     Status: Abnormal   Collection Time: 11/27/20  4:31 PM  Result Value Ref Range   Glucose-Capillary 111 (H) 70 - 99 mg/dL    Comment: Glucose reference range applies only to samples taken after fasting for at least 8 hours.    Current Facility-Administered Medications  Medication Dose Route Frequency Provider Last Rate Last Admin   apixaban (ELIQUIS) tablet 5 mg  5 mg Oral BID Charise Killian, MD   5 mg at  11/27/20 0350   docusate sodium (COLACE) capsule 100 mg  100 mg Oral BID Lyndle Herrlich, MD       FLUoxetine (PROZAC) capsule 20 mg  20 mg Oral Daily Lyndle Herrlich, MD   20 mg at 11/27/20 0944   folic acid (FOLVITE) tablet 1 mg  1 mg Oral Daily Lyndle Herrlich, MD   1 mg at 11/27/20 0943   HYDROcodone-acetaminophen (NORCO/VICODIN) 5-325 MG per tablet 1-2 tablet  1-2 tablet Oral Q6H PRN Lyndle Herrlich, MD   1 tablet at 11/27/20 0943   insulin aspart (novoLOG) injection 0-15 Units  0-15 Units Subcutaneous TID WC Lyndle Herrlich, MD   2 Units at 11/26/20 1637   LORazepam (ATIVAN) injection 0-4 mg  0-4 mg Intravenous Q12H Lyndle Herrlich, MD       Or   LORazepam (ATIVAN) tablet 0-4 mg  0-4 mg Oral Q12H Lyndle Herrlich, MD       methocarbamol (ROBAXIN) tablet 500 mg  500 mg Oral Q6H PRN Lyndle Herrlich, MD   500 mg at 11/26/20 2106   Or   methocarbamol (ROBAXIN) 500 mg in dextrose 5 % 50 mL IVPB  500 mg Intravenous Q6H PRN Lyndle Herrlich, MD       metoCLOPramide (REGLAN) tablet 5-10 mg  5-10 mg Oral Q8H PRN Lyndle Herrlich, MD       Or   metoCLOPramide (REGLAN) injection 5-10 mg  5-10 mg Intravenous Q8H PRN Lyndle Herrlich, MD       metoprolol succinate (TOPROL-XL) 24 hr tablet 25 mg  25 mg Oral Daily Lyndle Herrlich, MD   25 mg at 11/27/20 0938   morphine 2 MG/ML injection 0.5 mg  0.5 mg Intravenous Q2H PRN Lyndle Herrlich, MD       multivitamin with minerals tablet 1 tablet  1 tablet Oral Daily Lyndle Herrlich, MD   1 tablet at 11/27/20 0943   ondansetron (ZOFRAN) tablet 4 mg  4 mg Oral Q6H PRN Lyndle Herrlich, MD       Or   ondansetron [facility]) injection 4 mg  4 mg Intravenous Q6H PRN Lyndle Herrlich, MD       thiamine tablet 100 mg  100 mg Oral Daily Lyndle Herrlich, MD   100 mg at 11/27/20 1829    Musculoskeletal: Strength & Muscle Tone: decreased Gait & Station: unsteady Patient leans: N/A            Psychiatric Specialty Exam:  Presentation  General Appearance:  No  data recorded Eye Contact: No data recorded Speech: No data recorded Speech Volume: No data recorded Handedness: No data recorded  Mood and Affect  Mood: No data recorded Affect: No data recorded  Thought Process  Thought Processes: No data recorded Descriptions of Associations:No data recorded Orientation:No data recorded Thought  Content:No data recorded History of Schizophrenia/Schizoaffective disorder:No  Duration of Psychotic Symptoms:No data recorded Hallucinations:No data recorded Ideas of Reference:No data recorded Suicidal Thoughts:No data recorded Homicidal Thoughts:No data recorded  Sensorium  Memory: No data recorded Judgment: No data recorded Insight: No data recorded  Executive Functions  Concentration: No data recorded Attention Span: No data recorded Recall: No data recorded Fund of Knowledge: No data recorded Language: No data recorded  Psychomotor Activity  Psychomotor Activity: No data recorded  Assets  Assets: No data recorded  Sleep  Sleep: No data recorded  Physical Exam: Physical Exam Vitals and nursing note reviewed.  Constitutional:      Appearance: Normal appearance.  HENT:     Head: Normocephalic and atraumatic.     Mouth/Throat:     Pharynx: Oropharynx is clear.  Eyes:     Pupils: Pupils are equal, round, and reactive to light.  Cardiovascular:     Rate and Rhythm: Normal rate and regular rhythm.  Pulmonary:     Effort: Pulmonary effort is normal.     Breath sounds: Normal breath sounds.  Abdominal:     General: Abdomen is flat.     Palpations: Abdomen is soft.  Musculoskeletal:        General: Normal range of motion.  Skin:    General: Skin is warm and dry.  Neurological:     General: No focal deficit present.     Mental Status: He is alert. Mental status is at baseline.  Psychiatric:        Attention and Perception: He is inattentive.        Mood and Affect: Mood normal. Affect is blunt.         Speech: Speech is delayed.        Behavior: Behavior is slowed.        Thought Content: Thought content normal.        Cognition and Memory: Memory is impaired.   Review of Systems  Constitutional: Negative.   HENT: Negative.    Eyes: Negative.   Respiratory: Negative.    Cardiovascular: Negative.   Gastrointestinal: Negative.   Musculoskeletal: Negative.   Skin: Negative.   Neurological: Negative.   Psychiatric/Behavioral: Negative.    Blood pressure 120/88, pulse 73, temperature 98.2 F (36.8 C), resp. rate 20, height 6' (1.829 m), weight 77.1 kg, SpO2 100 %. Body mass index is 23.06 kg/m.  Treatment Plan Summary: Plan spoke with treatment team today.  If wife continues to feel unsafe with him coming home and the patient is resistant to willingly being admitted to a psych hospital I recommend referral to geriatric psychiatry units.  Patient would not be appropriate for admission to our unit because of medical condition and age.  IVC not yet filed today although if required we can do that.  Disposition: Recommend psychiatric Inpatient admission when medically cleared.  Mordecai Rasmussen, MD 11/27/2020 4:50 PM

## 2020-11-27 NOTE — Progress Notes (Signed)
PROGRESS NOTE    James Sanford  FOY:[id number] DOB: [birth year] DOA: 11/23/2020 PCP: James Mina, MD   Assessment & Plan:   Principal Problem:   Moderate major depression, single episode (HCC) Active Problems:   Alcohol abuse   Diabetes mellitus type 2, uncomplicated (HCC)   Hypertension   Intertrochanteric fracture of left femur, closed, initial encounter (HCC)   Acute lower UTI   Protein-calorie malnutrition, severe   Moderate major depression:  as per psych. W/ suicidal ideation. Does not have any insight to his condition. Pt's wife has recordings of multiple threats that he has made and a plan to use a handgun. Continue on fluoxetine as per psych. Discussed with psychiatry and he does not need one-to-one precautions at this time but will need to be discharged to facility with [facility] psych for stabilization. May need to file involuntary commitment at some point if he continues to be resistant to treatment as per psych   Left hip fracture: s/p left intramedullary nail intertrochanteric as per ortho surg. Norco, morphine prn. PT recs HH   UTI: urine cx is growing insignificant growth. Completed abx course   Likely PAF: continue on metoprolol and eliquis  Alcohol abuse: continue on CIWA protocol. Continue on multivitamin, folic & thiamine    HTN: continue on metoprolol. Lisinopril was d/c    DM2: likely well controlled. Continue on SSI w/ accuchecks  Thrombocytopenia: etiology unclear. Will continue to monitor   Leukocytosis: resolved   DVT prophylaxis: eliquis  Code Status: full  Family Communication: discussed pt's care w/ pt's wife, James Sanford, and answered her questions Disposition Plan: unclear, possible geri psych   Level of care: Med-Surg  Status is: Inpatient  Remains inpatient appropriate because:Unsafe d/c plan and Inpatient level of care appropriate due to severity of illness  Dispo: The patient is from: Home              Anticipated d/c is to: geri  psych              Patient currently is not medically stable to d/c.   Difficult to place patient: unclear    Consultants:    Procedures:   Antimicrobials:    Subjective: Pt c/o fatigue  Objective: Vitals:   11/26/20 1242 11/26/20 1623 11/26/20 2032 11/27/20 0454  BP: 110/79 111/80 114/74 109/78  Pulse: 75 68 61 69  Resp: 14 16 17 17   Temp: 98.3 F (36.8 C) 98.1 F (36.7 C) 98 F (36.7 C) 97.9 F (36.6 C)  TempSrc:      SpO2: 100% 100% 100% 99%  Weight:      Height:        Intake/Output Summary (Last 24 hours) at 11/27/2020 0742 Last data filed at 11/27/2020 0214 Gross per 24 hour  Intake 534.54 ml  Output --  Net 534.54 ml   Filed Weights   11/23/20 1438  Weight: 77.1 kg    Examination:  General exam: Appears comfortable  Respiratory system: Clear breath sounds b/l  Cardiovascular system: S1/S2+. No rubs or clicks Gastrointestinal system: Abd is soft, NT, ND & hypoactive bowel sounds Central nervous system: Alert and oriented. Moves all extremities Psychiatry: Judgement and insight appear normal. Flat mood and affect    Data Reviewed: I have personally reviewed following labs and imaging studies  CBC: Recent Labs  Lab 11/23/20 1440 11/26/20 0944 11/27/20 0427  WBC 6.6 11.0* 7.3  HGB 13.0 10.9* 10.1*  HCT 38.2* 33.3* 30.3*  MCV 98.7 100.0 100.7*  PLT  148* 134* 135*   Basic Metabolic Panel: Recent Labs  Lab 11/23/20 1440 11/26/20 0944 11/27/20 0427  NA 136 137 140  K 4.7 3.6 3.8  CL 101 100 102  CO2 26 27 31   GLUCOSE 155* 171* 106*  BUN 14 29* 33*  CREATININE 0.74 0.75 0.69  CALCIUM 8.1* 8.2* 7.6*   GFR: Estimated Creatinine Clearance: 103.1 mL/min (by C-G formula based on SCr of 0.69 mg/dL). Liver Function Tests: No results for input(s): AST, ALT, ALKPHOS, BILITOT, PROT, ALBUMIN in the last 168 hours. No results for input(s): LIPASE, AMYLASE in the last 168 hours. No results for input(s): AMMONIA in the last 168  hours. Coagulation Profile: No results for input(s): INR, PROTIME in the last 168 hours. Cardiac Enzymes: No results for input(s): CKTOTAL, CKMB, CKMBINDEX, TROPONINI in the last 168 hours. BNP (last 3 results) No results for input(s): PROBNP in the last 8760 hours. HbA1C: No results for input(s): HGBA1C in the last 72 hours.  CBG: Recent Labs  Lab 11/25/20 2100 11/26/20 0753 11/26/20 1234 11/26/20 1625 11/26/20 2035  GLUCAP 180* 120* 152* 121* 147*   Lipid Profile: No results for input(s): CHOL, HDL, LDLCALC, TRIG, CHOLHDL, LDLDIRECT in the last 72 hours. Thyroid Function Tests: No results for input(s): TSH, T4TOTAL, FREET4, T3FREE, THYROIDAB in the last 72 hours. Anemia Panel: No results for input(s): VITAMINB12, FOLATE, FERRITIN, TIBC, IRON, RETICCTPCT in the last 72 hours. Sepsis Labs: No results for input(s): PROCALCITON, LATICACIDVEN in the last 168 hours.  Recent Results (from the past 240 hour(s))  Resp Panel by RT-PCR (Flu A&B, Covid) Nasopharyngeal Swab     Status: None   Collection Time: 11/24/20  4:21 AM   Specimen: Nasopharyngeal Swab; Nasopharyngeal(NP) swabs in vial transport medium  Result Value Ref Range Status   SARS Coronavirus 2 by RT PCR NEGATIVE NEGATIVE Final    Comment: (NOTE) SARS-CoV-2 target nucleic acids are NOT DETECTED.  The SARS-CoV-2 RNA is generally detectable in upper respiratory specimens during the acute phase of infection. The lowest concentration of SARS-CoV-2 viral copies this assay can detect is 138 copies/mL. A negative result does not preclude SARS-Cov-2 infection and should not be used as the sole basis for treatment or other patient management decisions. A negative result may occur with  improper specimen collection/handling, submission of specimen other than nasopharyngeal swab, presence of viral mutation(s) within the areas targeted by this assay, and inadequate number of viral copies(<138 copies/mL). A negative result must  be combined with clinical observations, patient history, and epidemiological information. The expected result is Negative.  Fact Sheet for Patients:  11/26/20  Fact Sheet for Healthcare Providers:  BloggerCourse.com  This test is no t yet approved or cleared by the [place] FDA and  has been authorized for detection and/or diagnosis of SARS-CoV-2 by FDA under an Emergency Use Authorization (EUA). This EUA will remain  in effect (meaning this test can be used) for the duration of the COVID-19 declaration under Section 564(b)(1) of the Act, 21 U.S.C.section 360bbb-3(b)(1), unless the authorization is terminated  or revoked sooner.       Influenza A by PCR NEGATIVE NEGATIVE Final   Influenza B by PCR NEGATIVE NEGATIVE Final    Comment: (NOTE) The Xpert Xpress SARS-CoV-2/FLU/RSV plus assay is intended as an aid in the diagnosis of influenza from Nasopharyngeal swab specimens and should not be used as a sole basis for treatment. Nasal washings and aspirates are unacceptable for Xpert Xpress SARS-CoV-2/FLU/RSV testing.  Fact Sheet for Patients:  BloggerCourse.com  Fact Sheet for Healthcare Providers: [employer]ious[employer]ker.it  This test is not yet approved or cleared by the [place] FDA and has been authorized for detection and/or diagnosis of SARS-CoV-2 by FDA under an Emergency Use Authorization (EUA). This EUA will remain in effect (meaning this test can be used) for the duration of the COVID-19 declaration under Section 564(b)(1) of the Act, 21 U.S.C. section 360bbb-3(b)(1), unless the authorization is terminated or revoked.  Performed at [facility], [address], [place], [place] [address]          Radiology Studies: DG HIP OPERATIVE Lucienne Capers OR W/O PELVIS LEFT  Result Date: 11/25/2020 CLINICAL DATA:  Left IM nail. EXAM: OPERATIVE LEFT HIP (WITH  PELVIS IF PERFORMED) TECHNIQUE: Fluoroscopic spot image(s) were submitted for interpretation post-operatively. COMPARISON:  Preoperative radiographs yesterday. FINDINGS: Four fluoroscopic spot views of the left hip obtained in the operating room. Intramedullary nail attention comparison distal locking screw transverse intertrochanteric femur fracture. Fluoroscopy time 44 seconds. Dose not provided. IMPRESSION: Procedural fluoroscopy for ORIF left hip fracture. Electronically Signed   By: Narda Rutherford M.D.   On: 11/25/2020 16:01        Scheduled Meds:  apixaban  5 mg Oral BID   docusate sodium  100 mg Oral BID   FLUoxetine  20 mg Oral Daily   folic acid  1 mg Oral Daily   insulin aspart  0-15 Units Subcutaneous TID WC   LORazepam  0-4 mg Intravenous Q12H   Or   LORazepam  0-4 mg Oral Q12H   metoprolol succinate  25 mg Oral Daily   multivitamin with minerals  1 tablet Oral Daily   thiamine  100 mg Oral Daily   Continuous Infusions:  cefTRIAXone (ROCEPHIN)  IV Stopped (11/26/20 1535)   methocarbamol (ROBAXIN) IV       LOS: 3 days    Time spent: 31 mins     Charise Killian, MD [facility] Pager [phone]  If 7PM-7AM, please contact night-coverage 11/27/2020, 7:42 AM

## 2020-11-27 NOTE — Progress Notes (Signed)
Physical Therapy Treatment Patient Details Name: James Sanford MRN: [id number] DOB: 07-10-1955 Today's Date: 11/27/2020    History of Present Illness Per MD notes, pt is a 65 y.o. male who presented to the ER via EMS for evaluation of a fall with a left intertrochanteric hip fx and is currently s/p IM nail WBAT LLE. PMH includes alcohol abuse, diabetes mellitus, hypertension, sleep apnea, paroxysmal atrial fibrillation on anticoagulation. MD assessment includes severe depression with suicidal ideation, UTI, alcohol abuse, and displaced, impacted intertrochanteric fracture of the left femur.    PT Comments    Pt was pleasant and motivated to participate during the session. Pt gave good effort and vitals were stable throughout session. Pt was able to ambulate further than last visit and was overall steady with no LOB. Pt performed transfers with good concentric/eccentric control. Pt has made good progress towards goals. PT recommendation has been updated to HHPT with supervision for mobility/OOB. Pt will benefit from HHPT upon discharge to safely address deficits listed in patient problem list for decreased caregiver assistance and eventual return to PLOF.     Follow Up Recommendations  Home health PT;Supervision for mobility/OOB     Equipment Recommendations  None recommended by PT    Recommendations for Other Services       Precautions / Restrictions Precautions Precautions: Fall Restrictions Weight Bearing Restrictions: No LLE Weight Bearing: Weight bearing as tolerated    Mobility  Bed Mobility Overal bed mobility: Needs Assistance Bed Mobility: Supine to Sit     Supine to sit: Supervision     General bed mobility comments: Verbal cues for sequencing.    Transfers Overall transfer level: Needs assistance Equipment used: Rolling walker (2 wheeled) Transfers: Sit to/from Stand Sit to Stand: Min guard; from elevated surface         General transfer comment:  Verbal cues for sequencing.  Ambulation/Gait Ambulation/Gait assistance: Min guard Gait Distance (Feet): 180 Feet Assistive device: Rolling walker (2 wheeled) Gait Pattern/deviations: Step-through pattern;Decreased step length - right;Decreased stance time - left;Decreased stride length Gait velocity: decreased   General Gait Details: Verbal cues for sequencing and walker placement. Pt was overall steady using RW with no LOB.   Stairs             Wheelchair Mobility    Modified Rankin (Stroke Patients Only)       Balance Overall balance assessment: Needs assistance Sitting-balance support: No upper extremity supported;Feet supported Sitting balance-Leahy Scale: Good Sitting balance - Comments: Supervision at EOB   Standing balance support: Bilateral upper extremity supported;During functional activity Standing balance-Leahy Scale: Good Standing balance comment: Able to stand feet together >30sec as well as min squats with no UE support and no LOB                            Cognition Arousal/Alertness: Awake/alert Behavior During Therapy: WFL for tasks assessed/performed Overall Cognitive Status: Within Functional Limits for tasks assessed                                        Exercises Total Joint Exercises Long Arc Quad: AROM;Strengthening;Both;10 reps;Seated (with manual resistance) Marching in Standing: AROM;Strengthening;Both;10 reps;Standing;Other (comment) (x10 standing; x10 seated) Other Exercises Other Exercises: Mini squats x10 with min guard for improved LE strength. Other Exercises: Car transfer education provided verbally and he verbalized understanding.  General Comments        Pertinent Vitals/Pain Pain Assessment: 0-10 Pain Score: 2  Pain Location: Left hip Pain Descriptors / Indicators: Aching;Sore Pain Intervention(s): Monitored during session;Premedicated before session;Repositioned    Home Living                       Prior Function            PT Goals (current goals can now be found in the care plan section) Progress towards PT goals: Progressing toward goals    Frequency    BID      PT Plan Discharge plan needs to be updated    Co-evaluation              AM-PAC PT "6 Clicks" Mobility   Outcome Measure  Help needed turning from your back to your side while in a flat bed without using bedrails?: None Help needed moving from lying on your back to sitting on the side of a flat bed without using bedrails?: A Little Help needed moving to and from a bed to a chair (including a wheelchair)?: A Little Help needed standing up from a chair using your arms (e.g., wheelchair or bedside chair)?: A Little Help needed to walk in hospital room?: A Little Help needed climbing 3-5 steps with a railing? : A Little 6 Click Score: 19    End of Session Equipment Utilized During Treatment: Gait belt Activity Tolerance: Patient tolerated treatment well Patient left: in chair;with call bell/phone within reach;with chair alarm set;with SCD's reapplied;Other (comment) (heels floating via pillow) Nurse Communication: Mobility status;Weight bearing status PT Visit Diagnosis: Unsteadiness on feet (R26.81);Other abnormalities of gait and mobility (R26.89);Muscle weakness (generalized) (M62.81);History of falling (Z91.81);Pain Pain - Right/Left: Left Pain - part of body: Hip     Time: 1014-1040 PT Time Calculation (min) (ACUTE ONLY): 26 min  Charges:                        Desiree Hane SPT 11/27/20, 1:23 PM

## 2020-11-28 DIAGNOSIS — F321 Major depressive disorder, single episode, moderate: Secondary | ICD-10-CM | POA: Diagnosis not present

## 2020-11-28 DIAGNOSIS — I480 Paroxysmal atrial fibrillation: Secondary | ICD-10-CM | POA: Diagnosis not present

## 2020-11-28 DIAGNOSIS — S72142A Displaced intertrochanteric fracture of left femur, initial encounter for closed fracture: Secondary | ICD-10-CM | POA: Diagnosis not present

## 2020-11-28 LAB — BASIC METABOLIC PANEL
Anion gap: 3 — ABNORMAL LOW (ref 5–15)
BUN: 23 mg/dL (ref 8–23)
CO2: 30 mmol/L (ref 22–32)
Calcium: 7.4 mg/dL — ABNORMAL LOW (ref 8.9–10.3)
Chloride: 107 mmol/L (ref 98–111)
Creatinine, Ser: 0.51 mg/dL — ABNORMAL LOW (ref 0.61–1.24)
GFR, Estimated: >60 mL/min (ref >60)
Glucose, Bld: 102 mg/dL — ABNORMAL HIGH (ref 70–99)
Potassium: 3.2 mmol/L — ABNORMAL LOW (ref 3.5–5.1)
Sodium: 140 mmol/L (ref 135–145)

## 2020-11-28 LAB — CBC
HCT: 29.8 % — ABNORMAL LOW (ref 39.0–52.0)
Hemoglobin: 10 g/dL — ABNORMAL LOW (ref 13.0–17.0)
MCH: 33.4 pg (ref 26.0–34.0)
MCHC: 33.6 g/dL (ref 30.0–36.0)
MCV: 99.7 fL (ref 80.0–100.0)
Platelets: 150 10*3/uL (ref 150–400)
RBC: 2.99 MIL/uL — ABNORMAL LOW (ref 4.22–5.81)
RDW: 16.2 % — ABNORMAL HIGH (ref 11.5–15.5)
WBC: 5.4 10*3/uL (ref 4.0–10.5)
nRBC: 0 % (ref 0.0–0.2)

## 2020-11-28 LAB — GLUCOSE, CAPILLARY
Glucose-Capillary: 95 mg/dL (ref 70–99)
Glucose-Capillary: 129 mg/dL — ABNORMAL HIGH (ref 70–99)
Glucose-Capillary: 92 mg/dL (ref 70–99)
Glucose-Capillary: 115 mg/dL — ABNORMAL HIGH (ref 70–99)

## 2020-11-28 MED ORDER — POTASSIUM CHLORIDE CRYS ER 20 MEQ PO TBCR
40.0000 meq | EXTENDED_RELEASE_TABLET | Freq: Once | ORAL | Status: AC
  Administered 2020-11-28: 40 meq via ORAL
  Filled 2020-11-28: qty 2

## 2020-11-28 NOTE — Progress Notes (Signed)
Physical Therapy Treatment Patient Details Name: James Sanford MRN: [id number] DOB: [birth year] Today's Date: 11/28/2020    History of Present Illness Per MD notes, pt is a 65 y.o. male who presented to the ER via EMS for evaluation of a fall with a left intertrochanteric hip fx and is currently s/p IM nail WBAT LLE. PMH includes alcohol abuse, diabetes mellitus, hypertension, sleep apnea, paroxysmal atrial fibrillation on anticoagulation. MD assessment includes severe depression with suicidal ideation, UTI, alcohol abuse, and displaced, impacted intertrochanteric fracture of the left femur.    PT Comments     Pt was pleasant and motivated to participate during the session. Pt gave good effort and vitals were stable throughout session. Pt was able to walk the circumference of the nurses station without any rest breaks and was gradually able to increase WB through LLE. Pt was able to perform sit<>stand transfers with decreased difficulty and good concentric/eccentric control compared to last visit after transfer training. Pt will benefit from HHPT upon discharge to safely address deficits listed in patient problem list for decreased caregiver assistance and eventual return to PLOF.   Follow Up Recommendations  Home health PT;Supervision for mobility/OOB     Equipment Recommendations  None recommended by PT    Recommendations for Other Services       Precautions / Restrictions Precautions Precautions: Fall Restrictions Weight Bearing Restrictions: No LLE Weight Bearing: Weight bearing as tolerated    Mobility  Bed Mobility Overal bed mobility: Needs Assistance Bed Mobility: Supine to Sit     Supine to sit: Modified independent (Device/Increase time)     General bed mobility comments: NT, in recliner upon arrival    Transfers Overall transfer level: Needs assistance Equipment used: Rolling walker (2 wheeled) Transfers: Sit to/from Stand Sit to Stand: Min guard          General transfer comment: Able to stand from non-elevated surafce but requires increased effort and demonstrated poor eccentric control  Ambulation/Gait Ambulation/Gait assistance: Min guard Gait Distance (Feet): 200 Feet x 1; 55ft x2 Assistive device: Rolling walker (2 wheeled) Gait Pattern/deviations: Step-through pattern;Decreased step length - right;Decreased stance time - left;Decreased stride length Gait velocity: decreased   General Gait Details: Verbal cues for sequencing and walker placement. Pt was overall steady using RW with no LOB. Gradually increased WB throug LLE during walk.   Stairs Stairs: Yes Stairs assistance: Min guard Stair Management: One rail Right;Step to pattern;Forwards Number of Stairs: 4 (x2) General stair comments: Verbal cues for sequencing. Pt was steady navigating stairs with less difficulty descending stairs compared to last visit. No LOB.   Wheelchair Mobility    Modified Rankin (Stroke Patients Only)       Balance Overall balance assessment: Needs assistance Sitting-balance support: No upper extremity supported;Feet supported Sitting balance-Leahy Scale: Normal     Standing balance support: Bilateral upper extremity supported;During functional activity Standing balance-Leahy Scale: Good Standing balance comment: Some reliance UE support through RW                            Cognition Arousal/Alertness: Awake/alert Behavior During Therapy: WFL for tasks assessed/performed Overall Cognitive Status: Within Functional Limits for tasks assessed                                        Exercises Total Joint Exercises Towel Squeeze: AROM;Strengthening;Both;10 reps;Seated;Other (  comment) (with pillow) Long Arc Quad: AROM;Strengthening;Both;10 reps;Seated Marching in Standing: AROM;Strengthening;Both;10 reps;Standing;Seated (x10 standing; x10 seated) Other Exercises Other Exercises: Static standing 1-2 min  with min guard for improved activity tolerance. Other Exercises: Sit<>stand transfer training provided verbally and via demonstration from non elevated surfaces for improved LE strength and function and he demonstrated understanding.    General Comments        Pertinent Vitals/Pain Pain Assessment: No/denies pain Pain Score: 3  Pain Location: Left hip Pain Descriptors / Indicators: Aching;Sore Pain Intervention(s): Monitored during session    Home Living                      Prior Function            PT Goals (current goals can now be found in the care plan section) Progress towards PT goals: Progressing toward goals    Frequency    BID      PT Plan Current plan remains appropriate    Co-evaluation              AM-PAC PT "6 Clicks" Mobility   Outcome Measure  Help needed turning from your back to your side while in a flat bed without using bedrails?: None Help needed moving from lying on your back to sitting on the side of a flat bed without using bedrails?: None Help needed moving to and from a bed to a chair (including a wheelchair)?: A Little Help needed standing up from a chair using your arms (e.g., wheelchair or bedside chair)?: A Little Help needed to walk in hospital room?: A Little Help needed climbing 3-5 steps with a railing? : A Little 6 Click Score: 20    End of Session Equipment Utilized During Treatment: Gait belt Activity Tolerance: Patient tolerated treatment well Patient left: in chair;with call bell/phone within reach;with chair alarm set;with SCD's reapplied Nurse Communication: Mobility status;Weight bearing status PT Visit Diagnosis: Unsteadiness on feet (R26.81);Other abnormalities of gait and mobility (R26.89);Muscle weakness (generalized) (M62.81);History of falling (Z91.81);Pain Pain - Right/Left: Left Pain - part of body: Hip     Time: 5093-2671 PT Time Calculation (min) (ACUTE ONLY): 30 min  Charges:  $Gait  Training: 23-37 mins $Therapeutic Activity: 8-22 mins                     Desiree Hane SPT 11/28/20, 3:27 PM

## 2020-11-28 NOTE — Progress Notes (Signed)
PROGRESS NOTE    James Sanford  PPI:[id number] DOB: 06-May-1955 DOA: 11/23/2020 PCP: Jerl Mina, MD   Assessment & Plan:   Principal Problem:   Moderate major depression, single episode (HCC) Active Problems:   Alcohol abuse   Diabetes mellitus type 2, uncomplicated (HCC)   Hypertension   Intertrochanteric fracture of left femur, closed, initial encounter (HCC)   Acute lower UTI   Protein-calorie malnutrition, severe   Moderate major depression:  as per psych. W/ suicidal ideation. Does not have any insight to his condition. Pt's wife has recordings of multiple threats that he has made and a plan to use a handgun. Continue on fluoxetine as per psych. CM is working on getting the pt to [facility] facility still. May need to file involuntary commitment at some point if he continues to be resistant to treatment as per psych   Left hip fracture: s/p left intramedullary nail intertrochanteric as per ortho surg. Norco, morphine prn. PT recs HH  Hypokalemia: KCl repleated. Will continue to monitor   UTI: urine cx is growing insignificant growth. Completed abx course   Likely PAF: continue on eliquis, metoprolol   Alcohol abuse: continue on CIWA protocol. Continue on multivitamin, folic & thiamine    HTN: continue on metoprolol. Lisinopril was d/c   DM2: likely well controlled. Continue on SSI w/ accuchecks  Thrombocytopenia: resolved  Leukocytosis: resolved   DVT prophylaxis: eliquis  Code Status: full  Family Communication: discussed pt's care w/ pt's wife, [name], and answered her questions Disposition Plan: unclear, possible geri psych   Level of care: Med-Surg  Status is: Inpatient  Remains inpatient appropriate because:unsafe d/c plan  Dispo: The patient is from: Home              Anticipated d/c is to: geri psych              Patient currently: is medically stable for d/c    Difficult to place patient: unclear    Consultants:    Procedures:    Antimicrobials:    Subjective: Pt c/o malaise but denies any pain  Objective: Vitals:   11/27/20 1155 11/27/20 1537 11/27/20 2036 11/28/20 0503  BP: 115/83 120/88 112/84 108/85  Pulse: 66 73 60 64  Resp: 16 20 19 19   Temp: 98.7 F (37.1 C) 98.2 F (36.8 C) 98.3 F (36.8 C) 98.4 F (36.9 C)  TempSrc:      SpO2: 100% 100% 100% 99%  Weight:      Height:        Intake/Output Summary (Last 24 hours) at 11/28/2020 0738 Last data filed at 11/27/2020 1844 Gross per 24 hour  Intake 1200 ml  Output --  Net 1200 ml   Filed Weights   11/23/20 1438  Weight: 77.1 kg    Examination:  General exam: Appears calm & comfortable Respiratory system: Clear breath sounds b/l  Cardiovascular system: S1 & S2+. No rubs or clicks Gastrointestinal system: Abd is soft, NT, ND & normal bowel sounds  Central nervous system: Alert and oriented. Moves all extremities  Psychiatry: Judgement and insight appear normal. Flat mood and affect    Data Reviewed: I have personally reviewed following labs and imaging studies  CBC: Recent Labs  Lab 11/23/20 1440 11/26/20 0944 11/27/20 0427 11/28/20 0440  WBC 6.6 11.0* 7.3 5.4  HGB 13.0 10.9* 10.1* 10.0*  HCT 38.2* 33.3* 30.3* 29.8*  MCV 98.7 100.0 100.7* 99.7  PLT 148* 134* 135* 150   Basic Metabolic Panel:  Recent Labs  Lab 11/23/20 1440 11/26/20 0944 11/27/20 0427 11/28/20 0440  NA 136 137 140 140  K 4.7 3.6 3.8 3.2*  CL 101 100 102 107  CO2 26 27 31 30   GLUCOSE 155* 171* 106* 102*  BUN 14 29* 33* 23  CREATININE 0.74 0.75 0.69 0.51*  CALCIUM 8.1* 8.2* 7.6* 7.4*   GFR: Estimated Creatinine Clearance: 103.1 mL/min (A) (by C-G formula based on SCr of 0.51 mg/dL (L)). Liver Function Tests: No results for input(s): AST, ALT, ALKPHOS, BILITOT, PROT, ALBUMIN in the last 168 hours. No results for input(s): LIPASE, AMYLASE in the last 168 hours. No results for input(s): AMMONIA in the last 168 hours. Coagulation Profile: No results  for input(s): INR, PROTIME in the last 168 hours. Cardiac Enzymes: No results for input(s): CKTOTAL, CKMB, CKMBINDEX, TROPONINI in the last 168 hours. BNP (last 3 results) No results for input(s): PROBNP in the last 8760 hours. HbA1C: No results for input(s): HGBA1C in the last 72 hours.  CBG: Recent Labs  Lab 11/26/20 2035 11/27/20 0835 11/27/20 1152 11/27/20 1631 11/27/20 2037  GLUCAP 147* 99 103* 111* 129*   Lipid Profile: No results for input(s): CHOL, HDL, LDLCALC, TRIG, CHOLHDL, LDLDIRECT in the last 72 hours. Thyroid Function Tests: No results for input(s): TSH, T4TOTAL, FREET4, T3FREE, THYROIDAB in the last 72 hours. Anemia Panel: No results for input(s): VITAMINB12, FOLATE, FERRITIN, TIBC, IRON, RETICCTPCT in the last 72 hours. Sepsis Labs: No results for input(s): PROCALCITON, LATICACIDVEN in the last 168 hours.  Recent Results (from the past 240 hour(s))  Resp Panel by RT-PCR (Flu A&B, Covid) Nasopharyngeal Swab     Status: None   Collection Time: 11/24/20  4:21 AM   Specimen: Nasopharyngeal Swab; Nasopharyngeal(NP) swabs in vial transport medium  Result Value Ref Range Status   SARS Coronavirus 2 by RT PCR NEGATIVE NEGATIVE Final    Comment: (NOTE) SARS-CoV-2 target nucleic acids are NOT DETECTED.  The SARS-CoV-2 RNA is generally detectable in upper respiratory specimens during the acute phase of infection. The lowest concentration of SARS-CoV-2 viral copies this assay can detect is 138 copies/mL. A negative result does not preclude SARS-Cov-2 infection and should not be used as the sole basis for treatment or other patient management decisions. A negative result may occur with  improper specimen collection/handling, submission of specimen other than nasopharyngeal swab, presence of viral mutation(s) within the areas targeted by this assay, and inadequate number of viral copies(<138 copies/mL). A negative result must be combined with clinical observations,  patient history, and epidemiological information. The expected result is Negative.  Fact Sheet for Patients:  11/26/20  Fact Sheet for Healthcare Providers:  BloggerCourse.com  This test is no t yet approved or cleared by the [place] FDA and  has been authorized for detection and/or diagnosis of SARS-CoV-2 by FDA under an Emergency Use Authorization (EUA). This EUA will remain  in effect (meaning this test can be used) for the duration of the COVID-19 declaration under Section 564(b)(1) of the Act, 21 U.S.C.section 360bbb-3(b)(1), unless the authorization is terminated  or revoked sooner.       Influenza A by PCR NEGATIVE NEGATIVE Final   Influenza B by PCR NEGATIVE NEGATIVE Final    Comment: (NOTE) The Xpert Xpress SARS-CoV-2/FLU/RSV plus assay is intended as an aid in the diagnosis of influenza from Nasopharyngeal swab specimens and should not be used as a sole basis for treatment. Nasal washings and aspirates are unacceptable for Xpert Xpress SARS-CoV-2/FLU/RSV testing.  Fact Sheet for Patients: BloggerCourse.com  Fact Sheet for Healthcare Providers: [employer]ious[employer]ker.it  This test is not yet approved or cleared by the [place] FDA and has been authorized for detection and/or diagnosis of SARS-CoV-2 by FDA under an Emergency Use Authorization (EUA). This EUA will remain in effect (meaning this test can be used) for the duration of the COVID-19 declaration under Section 564(b)(1) of the Act, 21 U.S.C. section 360bbb-3(b)(1), unless the authorization is terminated or revoked.  Performed at [facility], [address], [place], [place] [address]   Urine Culture     Status: Abnormal   Collection Time: 11/26/20  8:39 AM   Specimen: Urine, Clean Catch  Result Value Ref Range Status   Specimen Description   Final    URINE, CLEAN CATCH Performed at  [facility], [address] [place], [place] [address]    Special Requests   Final    NONE Performed at [facility], [address] [place], [place] [address]    Culture (A)  Final    <10,000 COLONIES/mL INSIGNIFICANT GROWTH Performed at [facility], [address] [place], [place] [address]    Report Status 11/27/2020 FINAL  Final         Radiology Studies: No results found.      Scheduled Meds:  apixaban  5 mg Oral BID   docusate sodium  100 mg Oral BID   FLUoxetine  20 mg Oral Daily   folic acid  1 mg Oral Daily   insulin aspart  0-15 Units Subcutaneous TID WC   LORazepam  0-4 mg Intravenous Q12H   Or   LORazepam  0-4 mg Oral Q12H   metoprolol succinate  25 mg Oral Daily   multivitamin with minerals  1 tablet Oral Daily   thiamine  100 mg Oral Daily   Continuous Infusions:  methocarbamol (ROBAXIN) IV       LOS: 4 days    Time spent: 25 mins     Charise Killian, MD [facility] Pager [phone]  If 7PM-7AM, please contact night-coverage 11/28/2020, 7:38 AM

## 2020-11-28 NOTE — TOC Progression Note (Signed)
Transition of Care [facility]) - Progression Note    Patient Details  Name: James Sanford MRN: [id number] Date of Birth: 1956/01/11  [facility]) CM/SW Contact  Caryn Section, RN Phone Number: 11/28/2020, 8:58 AM  Clinical Narrative: RNCM spoke to patient's wife, who is aware that he is progressing well physically; however she continues to feel unsafe with the idea of patient discharging home.  She states that she has verbalized this with Care Team as well.  She is concerned with patient's disposition, and to ensure patient has the safest discharge plan according to patient's condition.           Expected Discharge Plan and Services                                                 Social Determinants of Health (SDOH) Interventions    Readmission Risk Interventions No flowsheet data found.

## 2020-11-28 NOTE — Plan of Care (Signed)
Patient alert and oriented x 4, denies pain with assess. Pleasant mood during shift denies thought of self harm. Dressing to left hip remains clean, dry and intact. Vitals stable, no respiratory distress on room air. Stable condition at end of shift, will continue to monitor.  Problem: Education: Goal: Verbalization of understanding the information provided (i.e., activity precautions, restrictions, etc) will improve 11/28/2020 0649 by Shelly Bombard, RN Outcome: Progressing 11/28/2020 0648 by Shelly Bombard, RN Outcome: Progressing Goal: Individualized Educational Video(s) 11/28/2020 0649 by Shelly Bombard, RN Outcome: Progressing 11/28/2020 0648 by Shelly Bombard, RN Outcome: Progressing   Problem: Education: Goal: Individualized Educational Video(s) 11/28/2020 0649 by Shelly Bombard, RN Outcome: Progressing 11/28/2020 0648 by Shelly Bombard, RN Outcome: Progressing   Problem: Activity: Goal: Ability to ambulate and perform ADLs will improve 11/28/2020 0649 by Shelly Bombard, RN Outcome: Progressing 11/28/2020 0648 by Shelly Bombard, RN Outcome: Progressing   Problem: Clinical Measurements: Goal: Postoperative complications will be avoided or minimized 11/28/2020 0649 by Shelly Bombard, RN Outcome: Progressing 11/28/2020 0648 by Shelly Bombard, RN Outcome: Progressing   Problem: Education: Goal: Knowledge of General Education information will improve Description: Including pain rating scale, medication(s)/side effects and non-pharmacologic comfort measures 11/28/2020 0649 by Shelly Bombard, RN Outcome: Progressing 11/28/2020 0648 by Shelly Bombard, RN Outcome: Progressing   Problem: Health Behavior/Discharge Planning: Goal: Ability to manage health-related needs will improve 11/28/2020 0649 by Shelly Bombard, RN Outcome: Progressing 11/28/2020 0648 by Shelly Bombard, RN Outcome: Progressing   Problem: Activity: Goal: Risk for activity intolerance will decrease 11/28/2020 0649 by  Shelly Bombard, RN Outcome: Progressing 11/28/2020 0648 by Shelly Bombard, RN Outcome: Progressing   Problem: Nutrition: Goal: Adequate nutrition will be maintained 11/28/2020 0649 by Shelly Bombard, RN Outcome: Progressing 11/28/2020 0648 by Shelly Bombard, RN Outcome: Progressing   Problem: Elimination: Goal: Will not experience complications related to bowel motility 11/28/2020 0649 by Shelly Bombard, RN Outcome: Progressing 11/28/2020 0648 by Shelly Bombard, RN Outcome: Progressing Goal: Will not experience complications related to urinary retention 11/28/2020 0649 by Shelly Bombard, RN Outcome: Progressing 11/28/2020 0648 by Shelly Bombard, RN Outcome: Progressing   Problem: Pain Managment: Goal: General experience of comfort will improve 11/28/2020 0649 by Shelly Bombard, RN Outcome: Progressing 11/28/2020 0648 by Shelly Bombard, RN Outcome: Progressing

## 2020-11-28 NOTE — Progress Notes (Signed)
Physical Therapy Treatment Patient Details Name: James Sanford MRN: [id number] DOB: 11-02-1955 Today's Date: 11/28/2020    History of Present Illness Per MD notes, pt is a 65 y.o. male who presented to the ER via EMS for evaluation of a fall with a left intertrochanteric hip fx and is currently s/p IM nail WBAT LLE. PMH includes alcohol abuse, diabetes mellitus, hypertension, sleep apnea, paroxysmal atrial fibrillation on anticoagulation. MD assessment includes severe depression with suicidal ideation, UTI, alcohol abuse, and displaced, impacted intertrochanteric fracture of the left femur.    PT Comments    Pt was pleasant and motivated to participate during the session. Pt gave good effort and vitals were stable throughout session. Pt demonstrated some difficulty performing sit<>stands from lower surfaces but able to perform with min guard and increased effort. Pt demonstrated increased steadiness navigating stairs compared to last session. Pt was able walk the circumference of the nurses station without any rest breaks. Pt reported less fatigue compared to previous sessions. Pt will benefit from HHPT upon discharge to safely address deficits listed in patient problem list for decreased caregiver assistance and eventual return to PLOF.     Follow Up Recommendations  Home health PT;Supervision for mobility/OOB     Equipment Recommendations  None recommended by PT    Recommendations for Other Services       Precautions / Restrictions Precautions Precautions: Fall Restrictions Weight Bearing Restrictions: No LLE Weight Bearing: Weight bearing as tolerated    Mobility  Bed Mobility Overal bed mobility: Needs Assistance Bed Mobility: Supine to Sit     Supine to sit: Modified independent (Device/Increase time)     General bed mobility comments: Used bed rails    Transfers Overall transfer level: Needs assistance Equipment used: Rolling walker (2 wheeled) Transfers:  Sit to/from Stand Sit to Stand: Min guard         General transfer comment: Able to stand from non-elevated surafce but requires increased effort and demonstrated poor eccentric control  Ambulation/Gait Ambulation/Gait assistance: Min guard Gait Distance (Feet): 230 Feet x1; 71ft x1 Assistive device: Rolling walker (2 wheeled) Gait Pattern/deviations: Step-through pattern;Decreased step length - right;Decreased stance time - left;Decreased stride length Gait velocity: decreased   General Gait Details: Verbal cues for sequencing and walker placement. Pt was overall steady using RW with no LOB.   Stairs Stairs: Yes Stairs assistance: Min guard Stair Management: One rail Right;Step to pattern;Forwards Number of Stairs: 4 (x2) General stair comments: Verbal cues for sequencing. Pt was steady navigating stairs with less difficulty descending stairs compared to last visit. No LOB.   Wheelchair Mobility    Modified Rankin (Stroke Patients Only)       Balance Overall balance assessment: Needs assistance Sitting-balance support: No upper extremity supported;Feet supported Sitting balance-Leahy Scale: Normal     Standing balance support: Bilateral upper extremity supported;During functional activity Standing balance-Leahy Scale: Good Standing balance comment: Some reliance UE support through RW                            Cognition Arousal/Alertness: Awake/alert Behavior During Therapy: WFL for tasks assessed/performed Overall Cognitive Status: Within Functional Limits for tasks assessed                                        Exercises Total Joint Exercises Marching in Standing: AROM;Strengthening;Both;10 reps;Standing Other Exercises  Other Exercises: Static standing 2-3 min with min guard for improved activity tolerance. Other Exercises: Car transfer education provided verbally and via simulation and he verbalized understanding. Other  Exercises: Sit<>stand transfer training from varying height surfaces for improved LE strength.    General Comments        Pertinent Vitals/Pain Pain Assessment: 0-10 Pain Score: 2  Pain Location: Left hip Pain Descriptors / Indicators: Aching;Sore Pain Intervention(s): Monitored during session;Repositioned    Home Living                      Prior Function            PT Goals (current goals can now be found in the care plan section) Progress towards PT goals: Progressing toward goals    Frequency    BID      PT Plan Current plan remains appropriate    Co-evaluation              AM-PAC PT "6 Clicks" Mobility   Outcome Measure  Help needed turning from your back to your side while in a flat bed without using bedrails?: None Help needed moving from lying on your back to sitting on the side of a flat bed without using bedrails?: None Help needed moving to and from a bed to a chair (including a wheelchair)?: A Little Help needed standing up from a chair using your arms (e.g., wheelchair or bedside chair)?: A Little Help needed to walk in hospital room?: A Little Help needed climbing 3-5 steps with a railing? : A Little 6 Click Score: 20    End of Session Equipment Utilized During Treatment: Gait belt Activity Tolerance: Patient tolerated treatment well Patient left: in chair;with call bell/phone within reach;with chair alarm set;with SCD's reapplied;Other (comment);with family/visitor present Nurse Communication: Mobility status;Weight bearing status PT Visit Diagnosis: Unsteadiness on feet (R26.81);Other abnormalities of gait and mobility (R26.89);Muscle weakness (generalized) (M62.81);History of falling (Z91.81);Pain Pain - Right/Left: Left Pain - part of body: Hip     Time: 8502-7741 PT Time Calculation (min) (ACUTE ONLY): 43 min  Charges:                        Desiree Hane SPT 11/28/20, 1:29 PM    Desiree Hane 11/28/2020, 11:21  AM

## 2020-11-29 DIAGNOSIS — I480 Paroxysmal atrial fibrillation: Secondary | ICD-10-CM | POA: Diagnosis not present

## 2020-11-29 DIAGNOSIS — S72142A Displaced intertrochanteric fracture of left femur, initial encounter for closed fracture: Secondary | ICD-10-CM | POA: Diagnosis not present

## 2020-11-29 DIAGNOSIS — F321 Major depressive disorder, single episode, moderate: Secondary | ICD-10-CM | POA: Diagnosis not present

## 2020-11-29 LAB — GLUCOSE, CAPILLARY
Glucose-Capillary: 106 mg/dL — ABNORMAL HIGH (ref 70–99)
Glucose-Capillary: 101 mg/dL — ABNORMAL HIGH (ref 70–99)
Glucose-Capillary: 141 mg/dL — ABNORMAL HIGH (ref 70–99)
Glucose-Capillary: 138 mg/dL — ABNORMAL HIGH (ref 70–99)

## 2020-11-29 LAB — CBC
HCT: 30.5 % — ABNORMAL LOW (ref 39.0–52.0)
Hemoglobin: 10.1 g/dL — ABNORMAL LOW (ref 13.0–17.0)
MCH: 33.1 pg (ref 26.0–34.0)
MCHC: 33.1 g/dL (ref 30.0–36.0)
MCV: 100 fL (ref 80.0–100.0)
Platelets: 179 10*3/uL (ref 150–400)
RBC: 3.05 MIL/uL — ABNORMAL LOW (ref 4.22–5.81)
RDW: 16.1 % — ABNORMAL HIGH (ref 11.5–15.5)
WBC: 5.4 10*3/uL (ref 4.0–10.5)
nRBC: 0 % (ref 0.0–0.2)

## 2020-11-29 LAB — BASIC METABOLIC PANEL
Anion gap: 7 (ref 5–15)
BUN: 21 mg/dL (ref 8–23)
CO2: 26 mmol/L (ref 22–32)
Calcium: 7.8 mg/dL — ABNORMAL LOW (ref 8.9–10.3)
Chloride: 107 mmol/L (ref 98–111)
Creatinine, Ser: 0.5 mg/dL — ABNORMAL LOW (ref 0.61–1.24)
GFR, Estimated: >60 mL/min (ref >60)
Glucose, Bld: 95 mg/dL (ref 70–99)
Potassium: 3.3 mmol/L — ABNORMAL LOW (ref 3.5–5.1)
Sodium: 140 mmol/L (ref 135–145)

## 2020-11-29 MED ORDER — POTASSIUM CHLORIDE CRYS ER 20 MEQ PO TBCR
40.0000 meq | EXTENDED_RELEASE_TABLET | Freq: Once | ORAL | Status: AC
  Administered 2020-11-29: 40 meq via ORAL
  Filled 2020-11-29: qty 2

## 2020-11-29 NOTE — Plan of Care (Signed)
No acute events during the night. VSS. Denies pain to left hip. Pending d/c to geri psych.  Problem: Education: Goal: Verbalization of understanding the information provided (i.e., activity precautions, restrictions, etc) will improve Outcome: Progressing Goal: Individualized Educational Video(s) Outcome: Progressing   Problem: Activity: Goal: Ability to ambulate and perform ADLs will improve Outcome: Progressing   Problem: Clinical Measurements: Goal: Postoperative complications will be avoided or minimized Outcome: Progressing   Problem: Self-Concept: Goal: Ability to maintain and perform role responsibilities to the fullest extent possible will improve Outcome: Progressing   Problem: Pain Management: Goal: Pain level will decrease Outcome: Progressing   Problem: Education: Goal: Knowledge of General Education information will improve Description: Including pain rating scale, medication(s)/side effects and non-pharmacologic comfort measures Outcome: Progressing   Problem: Health Behavior/Discharge Planning: Goal: Ability to manage health-related needs will improve Outcome: Progressing   Problem: Clinical Measurements: Goal: Ability to maintain clinical measurements within normal limits will improve Outcome: Progressing Goal: Will remain free from infection Outcome: Progressing Goal: Diagnostic test results will improve Outcome: Progressing Goal: Respiratory complications will improve Outcome: Progressing Goal: Cardiovascular complication will be avoided Outcome: Progressing   Problem: Activity: Goal: Risk for activity intolerance will decrease Outcome: Progressing   Problem: Nutrition: Goal: Adequate nutrition will be maintained Outcome: Progressing   Problem: Coping: Goal: Level of anxiety will decrease Outcome: Progressing   Problem: Elimination: Goal: Will not experience complications related to bowel motility Outcome: Progressing Goal: Will not  experience complications related to urinary retention Outcome: Progressing   Problem: Pain Managment: Goal: General experience of comfort will improve Outcome: Progressing   Problem: Safety: Goal: Ability to remain free from injury will improve Outcome: Progressing   Problem: Skin Integrity: Goal: Risk for impaired skin integrity will decrease Outcome: Progressing   Problem: Education: Goal: Knowledge of General Education information will improve Description: Including pain rating scale, medication(s)/side effects and non-pharmacologic comfort measures Outcome: Progressing   Problem: Health Behavior/Discharge Planning: Goal: Ability to manage health-related needs will improve Outcome: Progressing   Problem: Clinical Measurements: Goal: Ability to maintain clinical measurements within normal limits will improve Outcome: Progressing Goal: Will remain free from infection Outcome: Progressing Goal: Diagnostic test results will improve Outcome: Progressing Goal: Respiratory complications will improve Outcome: Progressing Goal: Cardiovascular complication will be avoided Outcome: Progressing   Problem: Activity: Goal: Risk for activity intolerance will decrease Outcome: Progressing   Problem: Nutrition: Goal: Adequate nutrition will be maintained Outcome: Progressing   Problem: Coping: Goal: Level of anxiety will decrease Outcome: Progressing   Problem: Elimination: Goal: Will not experience complications related to bowel motility Outcome: Progressing Goal: Will not experience complications related to urinary retention Outcome: Progressing

## 2020-11-29 NOTE — Plan of Care (Signed)
PT goals upgraded from supervision to Mod I with LRAD 2/2 progress with functional mobility.  Aleda Grana, PT, DPT 11/29/20, 9:11 AM  Problem: Acute Rehab PT Goals(only PT should resolve) Goal: Pt Will Transfer Bed To Chair/Chair To Bed Flowsheets (Taken 11/29/2020 0910) Pt will Transfer Bed to Chair/Chair to Bed: with modified independence Note: With LRAD; Upgrade 2/2 progress Goal: Pt Will Ambulate Flowsheets (Taken 11/29/2020 0910) Pt will Ambulate:  with modified independence  > 125 feet  with least restrictive assistive device Note: Upgrade 2/2 progress Goal: Pt Will Go Up/Down Stairs Flowsheets (Taken 11/29/2020 0910) Pt will Go Up / Down Stairs:  1-2 stairs  with modified independence  with rail(s)  with least restrictive assistive device Note: Upgrade 2/2 progress

## 2020-11-29 NOTE — Plan of Care (Signed)
  Problem: Pain Management: Goal: Pain level will decrease Outcome: Completed/Met   Problem: Clinical Measurements: Goal: Ability to maintain clinical measurements within normal limits will improve Outcome: Completed/Met   Problem: Clinical Measurements: Goal: Will remain free from infection Outcome: Completed/Met   Problem: Elimination: Goal: Will not experience complications related to bowel motility Outcome: Completed/Met

## 2020-11-29 NOTE — Progress Notes (Signed)
Physical Therapy Treatment Patient Details Name: James Sanford MRN: [id number] DOB: May 06, 1955 Today's Date: 11/29/2020    History of Present Illness Per MD notes, pt is a 65 y.o. male who presented to the ER via EMS for evaluation of a fall with a left intertrochanteric hip fx and is currently s/p IM nail WBAT LLE. PMH includes alcohol abuse, diabetes mellitus, hypertension, sleep apnea, paroxysmal atrial fibrillation on anticoagulation. MD assessment includes severe depression with suicidal ideation, UTI, alcohol abuse, and displaced, impacted intertrochanteric fracture of the left femur.    PT Comments    Pt seen for PT tx with pt agreeable to session. Pt is able to ambulate 1 lap around nurses station & negotiate stairs with R ascending rail (L descending rail) with BUE support with close supervision overall. Pt does require very brief standing pauses/rest breaks during gait & 1 seated rest break prior to stair negotiation. Pt continues to demonstrate slightly decreased weight shift to LLE during gait but is overall making good progress with functional mobility. PT upgraded goals from supervision to mod I overall 2/2 pt's progress. Updated frequency from BID to QD 2/2 good progress & encouraged pt to ambulate with nursing staff throughout the day. Will continue to follow pt acutely to progress independence with gait & stair negotiation, as well as LLE strengthening.     Follow Up Recommendations  Home health PT;Supervision for mobility/OOB     Equipment Recommendations  None recommended by PT    Recommendations for Other Services       Precautions / Restrictions Precautions Precautions: Fall Restrictions Weight Bearing Restrictions: Yes LLE Weight Bearing: Weight bearing as tolerated    Mobility  Bed Mobility               General bed mobility comments: not observed, pt received & left sitting in recliner    Transfers Overall transfer level: Needs  assistance Equipment used: Rolling walker (2 wheeled) Transfers: Sit to/from Stand Sit to Stand: Supervision         General transfer comment: sit>stand from recliner & mat table in gym  Ambulation/Gait Ambulation/Gait assistance: Supervision Gait Distance (Feet): 180 Feet Assistive device: Rolling walker (2 wheeled) Gait Pattern/deviations: Decreased stance time - left;Decreased stride length;Step-through pattern;Decreased step length - left;Decreased dorsiflexion - left Gait velocity: decreased   General Gait Details: Decreased L hip/knee flexion during LLE swing phase, decreased weight shifting L. Cuing for more fluid gait pattern with use of RW (pt with slight pause) but poor return demo; pt requires ~2-3 very brief standing rest breaks during gait around nurses station, sitting break once in gym prior to stair negotiation.   Stairs Stairs: Yes Stairs assistance: Supervision Stair Management: One rail Right;Step to pattern;Forwards Number of Stairs: 4 General stair comments: Min cuing for compensatory pattern rotates slightly & elects to hold R ascending rail with BUE   Wheelchair Mobility    Modified Rankin (Stroke Patients Only)       Balance Overall balance assessment: Needs assistance Sitting-balance support: No upper extremity supported;Feet supported Sitting balance-Leahy Scale: Normal     Standing balance support: Bilateral upper extremity supported;During functional activity Standing balance-Leahy Scale: Good Standing balance comment: Some reliance UE support through RW                            Cognition Arousal/Alertness: Awake/alert Behavior During Therapy: WFL for tasks assessed/performed Overall Cognitive Status: Within Functional Limits for tasks assessed  General Comments: Pt able to recall compensatory pattern for stair negotiation with min assist      Exercises      General  Comments        Pertinent Vitals/Pain Pain Assessment: No/denies pain    Home Living                      Prior Function            PT Goals (current goals can now be found in the care plan section) Acute Rehab PT Goals Patient Stated Goal: Get back to work PT Goal Formulation: With patient Time For Goal Achievement: 12/09/20 Potential to Achieve Goals: Good Progress towards PT goals: Progressing toward goals    Frequency    7X/week      PT Plan Frequency needs to be updated    Co-evaluation              AM-PAC PT "6 Clicks" Mobility   Outcome Measure    Help needed moving from lying on your back to sitting on the side of a flat bed without using bedrails?: None Help needed moving to and from a bed to a chair (including a wheelchair)?: A Little Help needed standing up from a chair using your arms (e.g., wheelchair or bedside chair)?: A Little Help needed to walk in hospital room?: A Little Help needed climbing 3-5 steps with a railing? : A Little 6 Click Score: 16    End of Session Equipment Utilized During Treatment: Gait belt Activity Tolerance: Patient tolerated treatment well Patient left: in chair;with chair alarm set;with call bell/phone within reach Nurse Communication: Mobility status PT Visit Diagnosis: Unsteadiness on feet (R26.81);Other abnormalities of gait and mobility (R26.89);Muscle weakness (generalized) (M62.81);History of falling (Z91.81)     Time: 5885-0277 PT Time Calculation (min) (ACUTE ONLY): 12 min  Charges:  $Therapeutic Activity: 8-22 mins                     Aleda Grana, PT, DPT 11/29/20, 9:18 AM    Sandi Mariscal 11/29/2020, 9:16 AM

## 2020-11-29 NOTE — Progress Notes (Signed)
PROGRESS NOTE    James Sanford  FYB:[id number] DOB: [birth year] DOA: 11/23/2020 PCP: Jerl Mina, MD   Assessment & Plan:   Principal Problem:   Moderate major depression, single episode (HCC) Active Problems:   Alcohol abuse   Diabetes mellitus type 2, uncomplicated (HCC)   Hypertension   Intertrochanteric fracture of left femur, closed, initial encounter (HCC)   Acute lower UTI   Protein-calorie malnutrition, severe   Moderate major depression:  as per psych. W/ suicidal ideation. Does not have any insight to his condition. Pt's wife has recordings of multiple threats that he has made and a plan to use a handgun. Continue on fluoxetine as per psych. CM is working on getting the pt to [facility] facility still. May need to file involuntary commitment at some point if he continues to be resistant to treatment as per psych   Left hip fracture: s/p left intramedullary nail intertrochanteric as per ortho surg. Norco, morphine prn for pain. PT recs HH   Hypokalemia: potassium given. Will continue to monitor    UTI: urine cx is growing insignificant growth. Completed abx course   Likely PAF: continue on metoprolol, eliquis   Alcohol abuse: continue on folic acid, thiamine & multivitamin     HTN: continue on metoprolol. Lisinopril was d/c   DM2: likely well controlled. Continue on SSI w/ accuchecks   Thrombocytopenia: resolved  Leukocytosis: resolved   DVT prophylaxis: eliquis  Code Status: full  Family Communication: discussed pt's care w/ pt's wife, [name], and answered her questions Disposition Plan: unclear, possible geri psych   Level of care: Med-Surg  Status is: Inpatient  Remains inpatient appropriate because:unsafe d/c plan  Dispo: The patient is from: Home              Anticipated d/c is to: geri psych              Patient currently: is medically stable for d/c    Difficult to place patient: unclear    Consultants:    Procedures:    Antimicrobials:    Subjective: Pt c/o fatigue   Objective: Vitals:   11/28/20 1531 11/28/20 2014 11/28/20 2340 11/29/20 0355  BP: 108/80 112/84 129/90 (!) 128/95  Pulse: 73 68 65 70  Resp: 18 17 18 17   Temp: 98.1 F (36.7 C) 98.5 F (36.9 C) 97.6 F (36.4 C) 98.2 F (36.8 C)  TempSrc:   Oral   SpO2: 100% 98% 98% 98%  Weight:      Height:        Intake/Output Summary (Last 24 hours) at 11/29/2020 0730 Last data filed at 11/29/2020 0725 Gross per 24 hour  Intake 240 ml  Output 400 ml  Net -160 ml   Filed Weights   11/23/20 1438  Weight: 77.1 kg    Examination:  General exam: Appears comfortable  Respiratory system: Clear breath sounds b/l  Cardiovascular system: S1/S2+. No rubs or gallops Gastrointestinal system: Abd is soft, NT, ND & normal bowel sounds  Central nervous system: Alert and oriented. Moves all extremities  Psychiatry: Judgement and insight appear normal. Flat mood and affect    Data Reviewed: I have personally reviewed following labs and imaging studies  CBC: Recent Labs  Lab 11/23/20 1440 11/26/20 0944 11/27/20 0427 11/28/20 0440 11/29/20 0428  WBC 6.6 11.0* 7.3 5.4 5.4  HGB 13.0 10.9* 10.1* 10.0* 10.1*  HCT 38.2* 33.3* 30.3* 29.8* 30.5*  MCV 98.7 100.0 100.7* 99.7 100.0  PLT 148* 134* 135* 150  179   Basic Metabolic Panel: Recent Labs  Lab 11/23/20 1440 11/26/20 0944 11/27/20 0427 11/28/20 0440 11/29/20 0428  NA 136 137 140 140 140  K 4.7 3.6 3.8 3.2* 3.3*  CL 101 100 102 107 107  CO2 26 27 31 30 26   GLUCOSE 155* 171* 106* 102* 95  BUN 14 29* 33* 23 21  CREATININE 0.74 0.75 0.69 0.51* 0.50*  CALCIUM 8.1* 8.2* 7.6* 7.4* 7.8*   GFR: Estimated Creatinine Clearance: 103.1 mL/min (A) (by C-G formula based on SCr of 0.5 mg/dL (L)). Liver Function Tests: No results for input(s): AST, ALT, ALKPHOS, BILITOT, PROT, ALBUMIN in the last 168 hours. No results for input(s): LIPASE, AMYLASE in the last 168 hours. No results for  input(s): AMMONIA in the last 168 hours. Coagulation Profile: No results for input(s): INR, PROTIME in the last 168 hours. Cardiac Enzymes: No results for input(s): CKTOTAL, CKMB, CKMBINDEX, TROPONINI in the last 168 hours. BNP (last 3 results) No results for input(s): PROBNP in the last 8760 hours. HbA1C: No results for input(s): HGBA1C in the last 72 hours.  CBG: Recent Labs  Lab 11/27/20 2037 11/28/20 0745 11/28/20 1224 11/28/20 1631 11/28/20 2121  GLUCAP 129* 95 129* 92 115*   Lipid Profile: No results for input(s): CHOL, HDL, LDLCALC, TRIG, CHOLHDL, LDLDIRECT in the last 72 hours. Thyroid Function Tests: No results for input(s): TSH, T4TOTAL, FREET4, T3FREE, THYROIDAB in the last 72 hours. Anemia Panel: No results for input(s): VITAMINB12, FOLATE, FERRITIN, TIBC, IRON, RETICCTPCT in the last 72 hours. Sepsis Labs: No results for input(s): PROCALCITON, LATICACIDVEN in the last 168 hours.  Recent Results (from the past 240 hour(s))  Resp Panel by RT-PCR (Flu A&B, Covid) Nasopharyngeal Swab     Status: None   Collection Time: 11/24/20  4:21 AM   Specimen: Nasopharyngeal Swab; Nasopharyngeal(NP) swabs in vial transport medium  Result Value Ref Range Status   SARS Coronavirus 2 by RT PCR NEGATIVE NEGATIVE Final    Comment: (NOTE) SARS-CoV-2 target nucleic acids are NOT DETECTED.  The SARS-CoV-2 RNA is generally detectable in upper respiratory specimens during the acute phase of infection. The lowest concentration of SARS-CoV-2 viral copies this assay can detect is 138 copies/mL. A negative result does not preclude SARS-Cov-2 infection and should not be used as the sole basis for treatment or other patient management decisions. A negative result may occur with  improper specimen collection/handling, submission of specimen other than nasopharyngeal swab, presence of viral mutation(s) within the areas targeted by this assay, and inadequate number of viral copies(<138  copies/mL). A negative result must be combined with clinical observations, patient history, and epidemiological information. The expected result is Negative.  Fact Sheet for Patients:  11/26/20  Fact Sheet for Healthcare Providers:  BloggerCourse.com  This test is no t yet approved or cleared by the [place] FDA and  has been authorized for detection and/or diagnosis of SARS-CoV-2 by FDA under an Emergency Use Authorization (EUA). This EUA will remain  in effect (meaning this test can be used) for the duration of the COVID-19 declaration under Section 564(b)(1) of the Act, 21 U.S.C.section 360bbb-3(b)(1), unless the authorization is terminated  or revoked sooner.       Influenza A by PCR NEGATIVE NEGATIVE Final   Influenza B by PCR NEGATIVE NEGATIVE Final    Comment: (NOTE) The Xpert Xpress SARS-CoV-2/FLU/RSV plus assay is intended as an aid in the diagnosis of influenza from Nasopharyngeal swab specimens and should not be used as a  sole basis for treatment. Nasal washings and aspirates are unacceptable for Xpert Xpress SARS-CoV-2/FLU/RSV testing.  Fact Sheet for Patients: BloggerCourse.com  Fact Sheet for Healthcare Providers: SeriousBroker.it  This test is not yet approved or cleared by the [place] FDA and has been authorized for detection and/or diagnosis of SARS-CoV-2 by FDA under an Emergency Use Authorization (EUA). This EUA will remain in effect (meaning this test can be used) for the duration of the COVID-19 declaration under Section 564(b)(1) of the Act, 21 U.S.C. section 360bbb-3(b)(1), unless the authorization is terminated or revoked.  Performed at [facility], [address] [place], [place] [address]   Urine Culture     Status: Abnormal   Collection Time: 11/26/20  8:39 AM   Specimen: Urine, Clean Catch  Result Value Ref Range  Status   Specimen Description   Final    URINE, CLEAN CATCH Performed at [facility], [address], [place], [place] [address]    Special Requests   Final    NONE Performed at [facility], [address] [place], [place] [address]    Culture (A)  Final    <10,000 COLONIES/mL INSIGNIFICANT GROWTH Performed at [facility], [address], [place], [place] [address]    Report Status 11/27/2020 FINAL  Final         Radiology Studies: No results found.      Scheduled Meds:  apixaban  5 mg Oral BID   docusate sodium  100 mg Oral BID   FLUoxetine  20 mg Oral Daily   folic acid  1 mg Oral Daily   insulin aspart  0-15 Units Subcutaneous TID WC   metoprolol succinate  25 mg Oral Daily   multivitamin with minerals  1 tablet Oral Daily   potassium chloride  40 mEq Oral Once   thiamine  100 mg Oral Daily   Continuous Infusions:  methocarbamol (ROBAXIN) IV       LOS: 5 days    Time spent: 20 mins     Charise Killian, MD [facility] Pager [phone]  If 7PM-7AM, please contact night-coverage 11/29/2020, 7:30 AM

## 2020-11-29 NOTE — Consult Note (Signed)
[facility] Face-to-Face Psychiatry Consult   Reason for Consult: Follow-up consult for this 65 year old man admitted to the hospital with hip fracture but also suffering from depression and alcohol abuse Referring Physician: Mayford Knife Patient Identification: James Sanford MRN:  [id number] Principal Diagnosis: Moderate major depression, single episode (HCC) Diagnosis:  Principal Problem:   Moderate major depression, single episode (HCC) Active Problems:   Alcohol abuse   Diabetes mellitus type 2, uncomplicated (HCC)   Hypertension   Intertrochanteric fracture of left femur, closed, initial encounter (HCC)   Acute lower UTI   Protein-calorie malnutrition, severe   Total Time spent with patient: 30 minutes  Subjective:   James Sanford is a 65 y.o. male patient admitted with "I am feeling fine".  HPI: Patient seen chart reviewed.  This 65 year old man has continued to deny any symptoms of depression and deny alcohol abuse throughout his entire hospital stay.  Has never shown any insight or engaged in any treatment.  Wife has presented convincing evidence that the patient is showing signs of severe depression and alcohol abuse at home with multiple suicide threats.  His hip fracture recovery is coming along as would be expected.  He is not currently under IVC.  Past Psychiatric History: No past history of any engagement in treatment for depression or alcohol abuse  Risk to Self:   Risk to Others:   Prior Inpatient Therapy:   Prior Outpatient Therapy:    Past Medical History:  Past Medical History:  Diagnosis Date   Arthritis    Diabetes (HCC)    Hyperlipemia    Hypertension    Sleep apnea     Past Surgical History:  Procedure Laterality Date   COLONOSCOPY WITH PROPOFOL N/A 01/28/2020   Procedure: COLONOSCOPY WITH PROPOFOL;  Surgeon: Toney Reil, MD;  Location: [facility] ENDOSCOPY;  Service: Gastroenterology;  Laterality: N/A;   COLONOSCOPY WITH PROPOFOL N/A  01/28/2020   Procedure: COLONOSCOPY WITH PROPOFOL;  Surgeon: Toney Reil, MD;  Location: [facility] ENDOSCOPY;  Service: Gastroenterology;  Laterality: N/A;   ESOPHAGOGASTRODUODENOSCOPY (EGD) WITH PROPOFOL N/A 01/28/2020   Procedure: ESOPHAGOGASTRODUODENOSCOPY (EGD) WITH PROPOFOL;  Surgeon: Toney Reil, MD;  Location: [facility] ENDOSCOPY;  Service: Gastroenterology;  Laterality: N/A;   GASTRIC BYPASS     HERNIA REPAIR     INTRAMEDULLARY (IM) NAIL INTERTROCHANTERIC Left 11/25/2020   Procedure: INTRAMEDULLARY (IM) NAIL INTERTROCHANTRIC;  Surgeon: Lyndle Herrlich, MD;  Location: [facility] ORS;  Service: Orthopedics;  Laterality: Left;   JOINT REPLACEMENT Bilateral    Knee surgery   OPEN REDUCTION INTERNAL FIXATION (ORIF) DISTAL RADIAL FRACTURE Right 10/30/2017   Procedure: OPEN REDUCTION INTERNAL FIXATION (ORIF) DISTAL RADIAL FRACTURE;  Surgeon: Garnette Gunner, MD;  Location: [facility] ORS;  Service: Orthopedics;  Laterality: Right;   ORIF FEMUR FRACTURE Right 01/29/2020   Procedure: OPEN REDUCTION INTERNAL FIXATION (ORIF) DISTAL FEMUR FRACTURE;  Surgeon: Christena Flake, MD;  Location: [facility] ORS;  Service: Orthopedics;  Laterality: Right;   Family History:  Family History  Problem Relation Age of Onset   Stroke Mother    Suicidality Father    Family Psychiatric  History: Positive for father suicide Social History:  Social History   Substance and Sexual Activity  Alcohol Use Yes   Comment: beer occasionally     Social History   Substance and Sexual Activity  Drug Use Not Currently    Social History   Socioeconomic History   Marital status: Married    Spouse name: Not on file   Number of  children: Not on file   Years of education: Not on file   Highest education level: Not on file  Occupational History   Not on file  Tobacco Use   Smoking status: Never   Smokeless tobacco: Former    Types: Chew   Tobacco comments:    quir about 4 years ago  Vaping Use   Vaping Use: Never used   Substance and Sexual Activity   Alcohol use: Yes    Comment: beer occasionally   Drug use: Not Currently   Sexual activity: Not on file  Other Topics Concern   Not on file  Social History Narrative   Lives at home with his wife. Independent baseline   Social Determinants of Corporate investment banker Strain: Not on file  Food Insecurity: Not on file  Transportation Needs: Not on file  Physical Activity: Not on file  Stress: Not on file  Social Connections: Not on file   Additional Social History:    Allergies:   Allergies  Allergen Reactions   Penicillins Diarrhea    TOLERATED CEFAZOLIN 11/25/20 Unknown childhood reaction    Labs:  Results for orders placed or performed during the hospital encounter of 11/23/20 (from the past 48 hour(s))  Glucose, capillary     Status: Abnormal   Collection Time: 11/27/20  4:31 PM  Result Value Ref Range   Glucose-Capillary 111 (H) 70 - 99 mg/dL    Comment: Glucose reference range applies only to samples taken after fasting for at least 8 hours.  Glucose, capillary     Status: Abnormal   Collection Time: 11/27/20  8:37 PM  Result Value Ref Range   Glucose-Capillary 129 (H) 70 - 99 mg/dL    Comment: Glucose reference range applies only to samples taken after fasting for at least 8 hours.  CBC     Status: Abnormal   Collection Time: 11/28/20  4:40 AM  Result Value Ref Range   WBC 5.4 4.0 - 10.5 K/uL   RBC 2.99 (L) 4.22 - 5.81 MIL/uL   Hemoglobin 10.0 (L) 13.0 - 17.0 g/dL   HCT 51.0 (L) 25.8 - 52.7 %   MCV 99.7 80.0 - 100.0 fL   MCH 33.4 26.0 - 34.0 pg   MCHC 33.6 30.0 - 36.0 g/dL   RDW 78.2 (H) 42.3 - 53.6 %   Platelets 150 150 - 400 K/uL   nRBC 0.0 0.0 - 0.2 %    Comment: Performed at [facility], [address] [place], [place] [address]  Basic metabolic panel     Status: Abnormal   Collection Time: 11/28/20  4:40 AM  Result Value Ref Range   Sodium 140 135 - 145 mmol/L   Potassium 3.2 (L) 3.5 - 5.1 mmol/L    Chloride 107 98 - 111 mmol/L   CO2 30 22 - 32 mmol/L   Glucose, Bld 102 (H) 70 - 99 mg/dL    Comment: Glucose reference range applies only to samples taken after fasting for at least 8 hours.   BUN 23 8 - 23 mg/dL   Creatinine, Ser 5.40 (L) 0.61 - 1.24 mg/dL   Calcium 7.4 (L) 8.9 - 10.3 mg/dL   GFR, Estimated >08 >67 mL/min    Comment: (NOTE) Calculated using the CKD-EPI Creatinine Equation (2021)    Anion gap 3 (L) 5 - 15    Comment: Performed at [facility], [address] [place], [place] [address]  Glucose, capillary     Status:  None   Collection Time: 11/28/20  7:45 AM  Result Value Ref Range   Glucose-Capillary 95 70 - 99 mg/dL    Comment: Glucose reference range applies only to samples taken after fasting for at least 8 hours.  Glucose, capillary     Status: Abnormal   Collection Time: 11/28/20 12:24 PM  Result Value Ref Range   Glucose-Capillary 129 (H) 70 - 99 mg/dL    Comment: Glucose reference range applies only to samples taken after fasting for at least 8 hours.  Glucose, capillary     Status: None   Collection Time: 11/28/20  4:31 PM  Result Value Ref Range   Glucose-Capillary 92 70 - 99 mg/dL    Comment: Glucose reference range applies only to samples taken after fasting for at least 8 hours.  Glucose, capillary     Status: Abnormal   Collection Time: 11/28/20  9:21 PM  Result Value Ref Range   Glucose-Capillary 115 (H) 70 - 99 mg/dL    Comment: Glucose reference range applies only to samples taken after fasting for at least 8 hours.   Comment 1 Notify RN   CBC     Status: Abnormal   Collection Time: 11/29/20  4:28 AM  Result Value Ref Range   WBC 5.4 4.0 - 10.5 K/uL   RBC 3.05 (L) 4.22 - 5.81 MIL/uL   Hemoglobin 10.1 (L) 13.0 - 17.0 g/dL   HCT 16.130.5 (L) 09.639.0 - 04.552.0 %   MCV 100.0 80.0 - 100.0 fL   MCH 33.1 26.0 - 34.0 pg   MCHC 33.1 30.0 - 36.0 g/dL   RDW 40.916.1 (H) 81.111.5 - 91.415.5 %   Platelets 179 150 - 400 K/uL   nRBC 0.0 0.0 - 0.2 %    Comment:  Performed at [facility], [address] [place], [place] [address]  Basic metabolic panel     Status: Abnormal   Collection Time: 11/29/20  4:28 AM  Result Value Ref Range   Sodium 140 135 - 145 mmol/L   Potassium 3.3 (L) 3.5 - 5.1 mmol/L   Chloride 107 98 - 111 mmol/L   CO2 26 22 - 32 mmol/L   Glucose, Bld 95 70 - 99 mg/dL    Comment: Glucose reference range applies only to samples taken after fasting for at least 8 hours.   BUN 21 8 - 23 mg/dL   Creatinine, Ser 6.210.50 (L) 0.61 - 1.24 mg/dL   Calcium 7.8 (L) 8.9 - 10.3 mg/dL   GFR, Estimated >30>60 >86>60 mL/min    Comment: (NOTE) Calculated using the CKD-EPI Creatinine Equation (2021)    Anion gap 7 5 - 15    Comment: Performed at [facility], [address] [place], [place] [address]  Glucose, capillary     Status: Abnormal   Collection Time: 11/29/20  7:44 AM  Result Value Ref Range   Glucose-Capillary 106 (H) 70 - 99 mg/dL    Comment: Glucose reference range applies only to samples taken after fasting for at least 8 hours.   Comment 1 Notify RN    Comment 2 Document in Chart   Glucose, capillary     Status: Abnormal   Collection Time: 11/29/20 11:42 AM  Result Value Ref Range   Glucose-Capillary 101 (H) 70 - 99 mg/dL    Comment: Glucose reference range applies only to samples taken after fasting for at least 8 hours.   Comment 1 Notify RN    Comment 2 Document in  Chart     Current Facility-Administered Medications  Medication Dose Route Frequency Provider Last Rate Last Admin   apixaban (ELIQUIS) tablet 5 mg  5 mg Oral BID Charise Killian, MD   5 mg at 11/29/20 0810   FLUoxetine (PROZAC) capsule 20 mg  20 mg Oral Daily Lyndle Herrlich, MD   20 mg at 11/29/20 2979   folic acid (FOLVITE) tablet 1 mg  1 mg Oral Daily Lyndle Herrlich, MD   1 mg at 11/29/20 0810   HYDROcodone-acetaminophen (NORCO/VICODIN) 5-325 MG per tablet 1-2 tablet  1-2 tablet Oral Q6H PRN Lyndle Herrlich, MD   1 tablet at 11/27/20  8921   insulin aspart (novoLOG) injection 0-15 Units  0-15 Units Subcutaneous TID WC Lyndle Herrlich, MD   2 Units at 11/28/20 1309   methocarbamol (ROBAXIN) tablet 500 mg  500 mg Oral Q6H PRN Lyndle Herrlich, MD   500 mg at 11/26/20 2106   Or   methocarbamol (ROBAXIN) 500 mg in dextrose 5 % 50 mL IVPB  500 mg Intravenous Q6H PRN Lyndle Herrlich, MD       metoCLOPramide (REGLAN) tablet 5-10 mg  5-10 mg Oral Q8H PRN Lyndle Herrlich, MD       Or   metoCLOPramide (REGLAN) injection 5-10 mg  5-10 mg Intravenous Q8H PRN Lyndle Herrlich, MD       metoprolol succinate (TOPROL-XL) 24 hr tablet 25 mg  25 mg Oral Daily Lyndle Herrlich, MD   25 mg at 11/29/20 0810   morphine 2 MG/ML injection 0.5 mg  0.5 mg Intravenous Q2H PRN Lyndle Herrlich, MD       multivitamin with minerals tablet 1 tablet  1 tablet Oral Daily Lyndle Herrlich, MD   1 tablet at 11/29/20 0810   ondansetron (ZOFRAN) tablet 4 mg  4 mg Oral Q6H PRN Lyndle Herrlich, MD       Or   ondansetron [facility]) injection 4 mg  4 mg Intravenous Q6H PRN Lyndle Herrlich, MD       thiamine tablet 100 mg  100 mg Oral Daily Lyndle Herrlich, MD   100 mg at 11/29/20 1941    Musculoskeletal: Strength & Muscle Tone: within normal limits Gait & Station: unsteady Patient leans: N/A            Psychiatric Specialty Exam:  Presentation  General Appearance:  No data recorded Eye Contact: No data recorded Speech: No data recorded Speech Volume: No data recorded Handedness: No data recorded  Mood and Affect  Mood: No data recorded Affect: No data recorded  Thought Process  Thought Processes: No data recorded Descriptions of Associations:No data recorded Orientation:No data recorded Thought Content:No data recorded History of Schizophrenia/Schizoaffective disorder:No  Duration of Psychotic Symptoms:No data recorded Hallucinations:No data recorded Ideas of Reference:No data recorded Suicidal Thoughts:No data recorded Homicidal  Thoughts:No data recorded  Sensorium  Memory: No data recorded Judgment: No data recorded Insight: No data recorded  Executive Functions  Concentration: No data recorded Attention Span: No data recorded Recall: No data recorded Fund of Knowledge: No data recorded Language: No data recorded  Psychomotor Activity  Psychomotor Activity: No data recorded  Assets  Assets: No data recorded  Sleep  Sleep: No data recorded  Physical Exam: Physical Exam Vitals and nursing note reviewed.  Constitutional:      Appearance: Normal appearance.  HENT:     Head: Normocephalic and atraumatic.     Mouth/Throat:  Pharynx: Oropharynx is clear.  Eyes:     Pupils: Pupils are equal, round, and reactive to light.  Cardiovascular:     Rate and Rhythm: Normal rate and regular rhythm.  Pulmonary:     Effort: Pulmonary effort is normal.     Breath sounds: Normal breath sounds.  Abdominal:     General: Abdomen is flat.     Palpations: Abdomen is soft.  Musculoskeletal:        General: Normal range of motion.  Skin:    General: Skin is warm and dry.  Neurological:     General: No focal deficit present.     Mental Status: He is alert. Mental status is at baseline.  Psychiatric:        Mood and Affect: Mood normal.        Thought Content: Thought content normal.   Review of Systems  Constitutional: Negative.   HENT: Negative.    Eyes: Negative.   Respiratory: Negative.    Cardiovascular: Negative.   Gastrointestinal: Negative.   Musculoskeletal: Negative.   Skin: Negative.   Neurological: Negative.   Psychiatric/Behavioral: Negative.    Blood pressure 107/85, pulse 72, temperature 98.3 F (36.8 C), resp. rate 16, height 6' (1.829 m), weight 77.1 kg, SpO2 97 %. Body mass index is 23.06 kg/m.  Treatment Plan Summary: Plan recommendation has been for referral to geriatric psychiatry ward.  No success so far.  Patient is not under IVC but if a bed were available that  could be filed.  Meantime patient continues to deny symptoms and to refuse to engage in treatment or show any insight into his behavior.  Not receiving any active treatment for depression right now and unwilling to admit any need for any treatment.  If no bed appears to be available we may have to rediscuss the options with his wife.  Disposition: Recommend psychiatric Inpatient admission when medically cleared.  Mordecai Rasmussen, MD 11/29/2020 1:29 PM

## 2020-11-30 DIAGNOSIS — F321 Major depressive disorder, single episode, moderate: Secondary | ICD-10-CM | POA: Diagnosis not present

## 2020-11-30 DIAGNOSIS — I480 Paroxysmal atrial fibrillation: Secondary | ICD-10-CM | POA: Diagnosis not present

## 2020-11-30 DIAGNOSIS — S72142A Displaced intertrochanteric fracture of left femur, initial encounter for closed fracture: Secondary | ICD-10-CM | POA: Diagnosis not present

## 2020-11-30 LAB — CBC
HCT: 29.1 % — ABNORMAL LOW (ref 39.0–52.0)
Hemoglobin: 9.5 g/dL — ABNORMAL LOW (ref 13.0–17.0)
MCH: 32.9 pg (ref 26.0–34.0)
MCHC: 32.6 g/dL (ref 30.0–36.0)
MCV: 100.7 fL — ABNORMAL HIGH (ref 80.0–100.0)
Platelets: 217 10*3/uL (ref 150–400)
RBC: 2.89 MIL/uL — ABNORMAL LOW (ref 4.22–5.81)
RDW: 16.2 % — ABNORMAL HIGH (ref 11.5–15.5)
WBC: 5.7 10*3/uL (ref 4.0–10.5)
nRBC: 0 % (ref 0.0–0.2)

## 2020-11-30 LAB — GLUCOSE, CAPILLARY
Glucose-Capillary: 100 mg/dL — ABNORMAL HIGH (ref 70–99)
Glucose-Capillary: 117 mg/dL — ABNORMAL HIGH (ref 70–99)
Glucose-Capillary: 102 mg/dL — ABNORMAL HIGH (ref 70–99)
Glucose-Capillary: 103 mg/dL — ABNORMAL HIGH (ref 70–99)

## 2020-11-30 LAB — BASIC METABOLIC PANEL
Anion gap: 6 (ref 5–15)
BUN: 21 mg/dL (ref 8–23)
CO2: 27 mmol/L (ref 22–32)
Calcium: 8 mg/dL — ABNORMAL LOW (ref 8.9–10.3)
Chloride: 109 mmol/L (ref 98–111)
Creatinine, Ser: 0.57 mg/dL — ABNORMAL LOW (ref 0.61–1.24)
GFR, Estimated: >60 mL/min (ref >60)
Glucose, Bld: 101 mg/dL — ABNORMAL HIGH (ref 70–99)
Potassium: 3.5 mmol/L (ref 3.5–5.1)
Sodium: 142 mmol/L (ref 135–145)

## 2020-11-30 NOTE — Progress Notes (Signed)
Physical Therapy Treatment Patient Details Name: James Sanford MRN: [id number] DOB: 03-11-1956 Today's Date: 11/30/2020    History of Present Illness Per MD notes, pt is a 65 y.o. male who presented to the ER via EMS for evaluation of a fall with a left intertrochanteric hip fx and is currently s/p IM nail WBAT LLE. PMH includes alcohol abuse, diabetes mellitus, hypertension, sleep apnea, paroxysmal atrial fibrillation on anticoagulation. MD assessment includes severe depression with suicidal ideation, UTI, alcohol abuse, and displaced, impacted intertrochanteric fracture of the left femur.    PT Comments    Attempted x 2 this am. Pt initially very agitated and rude on attempts.  On third attempt he is up in chair and apologized for being rude earlier.  He is pleasant this attempt and stands, completes stairs, lap on unit and standing AROM.   Follow Up Recommendations  Home health PT;Supervision for mobility/OOB     Equipment Recommendations  None recommended by PT    Recommendations for Other Services       Precautions / Restrictions Precautions Precautions: Fall Restrictions Weight Bearing Restrictions: Yes LLE Weight Bearing: Weight bearing as tolerated    Mobility  Bed Mobility               General bed mobility comments: not observed, pt received & left sitting in recliner    Transfers Overall transfer level: Needs assistance Equipment used: Rolling walker (2 wheeled) Transfers: Sit to/from Stand Sit to Stand: Supervision            Ambulation/Gait Ambulation/Gait assistance: Supervision Gait Distance (Feet): 200 Feet Assistive device: Rolling walker (2 wheeled) Gait Pattern/deviations: Decreased stance time - left;Decreased stride length;Step-through pattern;Decreased step length - left;Decreased dorsiflexion - left Gait velocity: decreased       Stairs Stairs: Yes Stairs assistance: Supervision Stair Management: One rail Right;Step to  pattern;Forwards Number of Stairs: 4     Wheelchair Mobility    Modified Rankin (Stroke Patients Only)       Balance Overall balance assessment: Needs assistance Sitting-balance support: No upper extremity supported;Feet supported Sitting balance-Leahy Scale: Normal     Standing balance support: Bilateral upper extremity supported;During functional activity Standing balance-Leahy Scale: Good Standing balance comment: Some reliance UE support through RW                            Cognition Arousal/Alertness: Awake/alert Behavior During Therapy: WFL for tasks assessed/performed Overall Cognitive Status: Within Functional Limits for tasks assessed                                 General Comments: Pt in a foul mood on first 2 attemps but on 3rd he was ready for session and apologized for being rude.      Exercises Other Exercises Other Exercises: standing LE arom x 10    General Comments        Pertinent Vitals/Pain Pain Assessment: No/denies pain    Home Living                      Prior Function            PT Goals (current goals can now be found in the care plan section) Progress towards PT goals: Progressing toward goals    Frequency    7X/week      PT Plan Current plan remains appropriate  Co-evaluation              AM-PAC PT "6 Clicks" Mobility   Outcome Measure  Help needed turning from your back to your side while in a flat bed without using bedrails?: None Help needed moving from lying on your back to sitting on the side of a flat bed without using bedrails?: None Help needed moving to and from a bed to a chair (including a wheelchair)?: A Little Help needed standing up from a chair using your arms (e.g., wheelchair or bedside chair)?: A Little Help needed to walk in hospital room?: A Little Help needed climbing 3-5 steps with a railing? : A Little 6 Click Score: 20    End of Session Equipment  Utilized During Treatment: Gait belt Activity Tolerance: Patient tolerated treatment well Patient left: in chair;with chair alarm set;with call bell/phone within reach Nurse Communication: Mobility status PT Visit Diagnosis: Unsteadiness on feet (R26.81);Other abnormalities of gait and mobility (R26.89);Muscle weakness (generalized) (M62.81);History of falling (Z91.81) Pain - Right/Left: Left Pain - part of body: Hip     Time: 8676-1950 PT Time Calculation (min) (ACUTE ONLY): 10 min  Charges:  $Gait Training: 8-22 mins                    Danielle Dess, PTA 11/30/20, 11:34 AM , 11:30 AM

## 2020-11-30 NOTE — Progress Notes (Signed)
PROGRESS NOTE    James Sanford  [id number]:[id number] DOB: Jan 26, 1956 DOA: 11/23/2020 PCP: Jerl Mina, MD   Assessment & Plan:   Principal Problem:   Moderate major depression, single episode (HCC) Active Problems:   Alcohol abuse   Diabetes mellitus type 2, uncomplicated (HCC)   Hypertension   Intertrochanteric fracture of left femur, closed, initial encounter (HCC)   Acute lower UTI   Protein-calorie malnutrition, severe   Moderate major depression:  as per psych. W/ suicidal ideation. Does not have any insight to his condition. Pt's wife has recordings of multiple threats that he has made and a plan to use a handgun. Continue on fluoxetine as per psych. CM is working on getting the pt to [facility] facility still. May need to file involuntary commitment at some point if he continues to be resistant to treatment as per psych   Left hip fracture: s/p left intramedullary nail intertrochanteric as per ortho surg. Norco, morphine prn for pain. PT recs HH   Hypokalemia: WNL today   UTI: urine cx is growing insignificant growth. Completed abx course   Likely PAF: continue on eliquis, metoprolol   Alcohol abuse: continue on folic acid, thiamine & multivitamin     HTN: continue on BB. Lisinopril was d/c   DM2: likely well controlled. Continue on SSI w/ accuchecks   Thrombocytopenia: resolved  Leukocytosis: resolved   DVT prophylaxis: eliquis  Code Status: full  Family Communication: Disposition Plan: unclear, possible geri psych   Level of care: Med-Surg  Status is: Inpatient  Remains inpatient appropriate because:unsafe d/c plan  Dispo: The patient is from: Home              Anticipated d/c is to: geri psych              Patient currently: is medically stable for d/c    Difficult to place patient: unclear    Consultants:    Procedures:   Antimicrobials:    Subjective: Pt denies any complaints   Objective: Vitals:   11/29/20 1141 11/29/20 1550  11/29/20 2057 11/30/20 0444  BP: 107/85 94/68 94/68  114/74  Pulse: 72 73 83 74  Resp: 16 16 18 18   Temp: 98.3 F (36.8 C) 98.2 F (36.8 C) 98.5 F (36.9 C) 98.7 F (37.1 C)  TempSrc:      SpO2: 97% 100% 100% 99%  Weight:      Height:        Intake/Output Summary (Last 24 hours) at 11/30/2020 0728 Last data filed at 11/29/2020 1700 Gross per 24 hour  Intake 840 ml  Output 250 ml  Net 590 ml   Filed Weights   11/23/20 1438  Weight: 77.1 kg    Examination:  General exam: Appears calm and comfortable  Respiratory system: Clear breath sounds b/l  Cardiovascular system: S1 & S2+. No rubs or gallops Gastrointestinal system: Abd is soft, NT, ND & hypoactive bowel sounds  Central nervous system: Alert and oriented. Moves all extremities  Psychiatry: Judgement and insight appears normal. Flat mood and affect    Data Reviewed: I have personally reviewed following labs and imaging studies  CBC: Recent Labs  Lab 11/26/20 0944 11/27/20 0427 11/28/20 0440 11/29/20 0428 11/30/20 0426  WBC 11.0* 7.3 5.4 5.4 5.7  HGB 10.9* 10.1* 10.0* 10.1* 9.5*  HCT 33.3* 30.3* 29.8* 30.5* 29.1*  MCV 100.0 100.7* 99.7 100.0 100.7*  PLT 134* 135* 150 179 217   Basic Metabolic Panel: Recent Labs  Lab 11/26/20 0944  11/27/20 0427 11/28/20 0440 11/29/20 0428 11/30/20 0426  NA 137 140 140 140 142  K 3.6 3.8 3.2* 3.3* 3.5  CL 100 102 107 107 109  CO2 27 31 30 26 27   GLUCOSE 171* 106* 102* 95 101*  BUN 29* 33* 23 21 21   CREATININE 0.75 0.69 0.51* 0.50* 0.57*  CALCIUM 8.2* 7.6* 7.4* 7.8* 8.0*   GFR: Estimated Creatinine Clearance: 103.1 mL/min (A) (by C-G formula based on SCr of 0.57 mg/dL (L)). Liver Function Tests: No results for input(s): AST, ALT, ALKPHOS, BILITOT, PROT, ALBUMIN in the last 168 hours. No results for input(s): LIPASE, AMYLASE in the last 168 hours. No results for input(s): AMMONIA in the last 168 hours. Coagulation Profile: No results for input(s): INR, PROTIME in  the last 168 hours. Cardiac Enzymes: No results for input(s): CKTOTAL, CKMB, CKMBINDEX, TROPONINI in the last 168 hours. BNP (last 3 results) No results for input(s): PROBNP in the last 8760 hours. HbA1C: No results for input(s): HGBA1C in the last 72 hours.  CBG: Recent Labs  Lab 11/28/20 2121 11/29/20 0744 11/29/20 1142 11/29/20 1626 11/29/20 2149  GLUCAP 115* 106* 101* 141* 138*   Lipid Profile: No results for input(s): CHOL, HDL, LDLCALC, TRIG, CHOLHDL, LDLDIRECT in the last 72 hours. Thyroid Function Tests: No results for input(s): TSH, T4TOTAL, FREET4, T3FREE, THYROIDAB in the last 72 hours. Anemia Panel: No results for input(s): VITAMINB12, FOLATE, FERRITIN, TIBC, IRON, RETICCTPCT in the last 72 hours. Sepsis Labs: No results for input(s): PROCALCITON, LATICACIDVEN in the last 168 hours.  Recent Results (from the past 240 hour(s))  Resp Panel by RT-PCR (Flu A&B, Covid) Nasopharyngeal Swab     Status: None   Collection Time: 11/24/20  4:21 AM   Specimen: Nasopharyngeal Swab; Nasopharyngeal(NP) swabs in vial transport medium  Result Value Ref Range Status   SARS Coronavirus 2 by RT PCR NEGATIVE NEGATIVE Final    Comment: (NOTE) SARS-CoV-2 target nucleic acids are NOT DETECTED.  The SARS-CoV-2 RNA is generally detectable in upper respiratory specimens during the acute phase of infection. The lowest concentration of SARS-CoV-2 viral copies this assay can detect is 138 copies/mL. A negative result does not preclude SARS-Cov-2 infection and should not be used as the sole basis for treatment or other patient management decisions. A negative result may occur with  improper specimen collection/handling, submission of specimen other than nasopharyngeal swab, presence of viral mutation(s) within the areas targeted by this assay, and inadequate number of viral copies(<138 copies/mL). A negative result must be combined with clinical observations, patient history, and  epidemiological information. The expected result is Negative.  Fact Sheet for Patients:  2150  Fact Sheet for Healthcare Providers:  11/26/20  This test is no t yet approved or cleared by the [employer] FDA and  has been authorized for detection and/or diagnosis of SARS-CoV-2 by FDA under an Emergency Use Authorization (EUA). This EUA will remain  in effect (meaning this test can be used) for the duration of the COVID-19 declaration under Section 564(b)(1) of the Act, 21 U.S.C.section 360bbb-3(b)(1), unless the authorization is terminated  or revoked sooner.       Influenza A by PCR NEGATIVE NEGATIVE Final   Influenza B by PCR NEGATIVE NEGATIVE Final    Comment: (NOTE) The Xpert Xpress SARS-CoV-2/FLU/RSV plus assay is intended as an aid in the diagnosis of influenza from Nasopharyngeal swab specimens and should not be used as a sole basis for treatment. Nasal washings and aspirates are unacceptable for Xpert  Xpress SARS-CoV-2/FLU/RSV testing.  Fact Sheet for Patients: BloggerCourse.com  Fact Sheet for Healthcare Providers: SeriousBroker.it  This test is not yet approved or cleared by the [place] FDA and has been authorized for detection and/or diagnosis of SARS-CoV-2 by FDA under an Emergency Use Authorization (EUA). This EUA will remain in effect (meaning this test can be used) for the duration of the COVID-19 declaration under Section 564(b)(1) of the Act, 21 U.S.C. section 360bbb-3(b)(1), unless the authorization is terminated or revoked.  Performed at [facility], [address], [place], [place] [address]   Urine Culture     Status: Abnormal   Collection Time: 11/26/20  8:39 AM   Specimen: Urine, Clean Catch  Result Value Ref Range Status   Specimen Description   Final    URINE, CLEAN CATCH Performed at [facility], [address], [place], [place] [address]    Special Requests   Final    NONE Performed at [facility], [address] [place], [place] [address]    Culture (A)  Final    <10,000 COLONIES/mL INSIGNIFICANT GROWTH Performed at [facility], [address] [place], [place] [address]    Report Status 11/27/2020 FINAL  Final         Radiology Studies: No results found.      Scheduled Meds:  apixaban  5 mg Oral BID   FLUoxetine  20 mg Oral Daily   folic acid  1 mg Oral Daily   insulin aspart  0-15 Units Subcutaneous TID WC   metoprolol succinate  25 mg Oral Daily   multivitamin with minerals  1 tablet Oral Daily   thiamine  100 mg Oral Daily   Continuous Infusions:  methocarbamol (ROBAXIN) IV       LOS: 6 days    Time spent: 15 mins     Charise Killian, MD [facility] Pager [phone]  If 7PM-7AM, please contact night-coverage 11/30/2020, 7:28 AM

## 2020-11-30 NOTE — TOC Progression Note (Signed)
Transition of Care [facility]) - Progression Note    Patient Details  Name: James Sanford MRN: [id number] Date of Birth: [birth year]  Transition of Care [facility]) CM/SW Contact  Barrie Dunker, RN Phone Number: 11/30/2020, 9:12 AM  Clinical Narrative:     [name] [phone], they stated that a referral would have to be faxed prior to them giving bed availability information, fax number [phone], faxed the referral [name] at [phone], they are closed and the office hours are Monday thru Friday 8-4  Called [facility] at [phone], they have no beds available       Expected Discharge Plan and Services                                                 Social Determinants of Health (SDOH) Interventions    Readmission Risk Interventions No flowsheet data found.

## 2020-12-01 DIAGNOSIS — I480 Paroxysmal atrial fibrillation: Secondary | ICD-10-CM | POA: Diagnosis not present

## 2020-12-01 DIAGNOSIS — F321 Major depressive disorder, single episode, moderate: Secondary | ICD-10-CM | POA: Diagnosis not present

## 2020-12-01 DIAGNOSIS — S72142A Displaced intertrochanteric fracture of left femur, initial encounter for closed fracture: Secondary | ICD-10-CM | POA: Diagnosis not present

## 2020-12-01 LAB — CBC
HCT: 30.7 % — ABNORMAL LOW (ref 39.0–52.0)
Hemoglobin: 9.7 g/dL — ABNORMAL LOW (ref 13.0–17.0)
MCH: 31.7 pg (ref 26.0–34.0)
MCHC: 31.6 g/dL (ref 30.0–36.0)
MCV: 100.3 fL — ABNORMAL HIGH (ref 80.0–100.0)
Platelets: 255 10*3/uL (ref 150–400)
RBC: 3.06 MIL/uL — ABNORMAL LOW (ref 4.22–5.81)
RDW: 16.1 % — ABNORMAL HIGH (ref 11.5–15.5)
WBC: 6.3 10*3/uL (ref 4.0–10.5)
nRBC: 0 % (ref 0.0–0.2)

## 2020-12-01 LAB — BASIC METABOLIC PANEL
Anion gap: 6 (ref 5–15)
BUN: 19 mg/dL (ref 8–23)
CO2: 26 mmol/L (ref 22–32)
Calcium: 8 mg/dL — ABNORMAL LOW (ref 8.9–10.3)
Chloride: 107 mmol/L (ref 98–111)
Creatinine, Ser: 0.6 mg/dL — ABNORMAL LOW (ref 0.61–1.24)
GFR, Estimated: >60 mL/min (ref >60)
Glucose, Bld: 103 mg/dL — ABNORMAL HIGH (ref 70–99)
Potassium: 3.4 mmol/L — ABNORMAL LOW (ref 3.5–5.1)
Sodium: 139 mmol/L (ref 135–145)

## 2020-12-01 LAB — GLUCOSE, CAPILLARY
Glucose-Capillary: 100 mg/dL — ABNORMAL HIGH (ref 70–99)
Glucose-Capillary: 85 mg/dL (ref 70–99)
Glucose-Capillary: 152 mg/dL — ABNORMAL HIGH (ref 70–99)
Glucose-Capillary: 97 mg/dL (ref 70–99)

## 2020-12-01 MED ORDER — POTASSIUM CHLORIDE CRYS ER 20 MEQ PO TBCR
20.0000 meq | EXTENDED_RELEASE_TABLET | Freq: Once | ORAL | Status: AC
  Administered 2020-12-01: 20 meq via ORAL
  Filled 2020-12-01: qty 1

## 2020-12-01 MED ORDER — LOPERAMIDE HCL 2 MG PO CAPS
4.0000 mg | ORAL_CAPSULE | Freq: Once | ORAL | Status: AC
  Administered 2020-12-01: 4 mg via ORAL
  Filled 2020-12-01: qty 2

## 2020-12-01 NOTE — TOC Progression Note (Signed)
Transition of Care [facility]) - Progression Note    Patient Details  Name: James Sanford MRN: [id number] Date of Birth: 1955/12/27  Transition of [facility]) CM/SW Contact  Caryn Section, RN Phone Number: 12/01/2020, 3:50 PM  Clinical Narrative:  RNCM left message for [facility] in [place].  Reached out to [facility], left message. Awaiting responses for placement at this time.         Expected Discharge Plan and Services                                                 Social Determinants of Health (SDOH) Interventions    Readmission Risk Interventions No flowsheet data found.

## 2020-12-01 NOTE — Progress Notes (Signed)
PROGRESS NOTE    James Sanford  [id number] DOB: March 29, 1956 DOA: 11/23/2020 PCP: Jerl Mina, MD   Assessment & Plan:   Principal Problem:   Moderate major depression, single episode (HCC) Active Problems:   Alcohol abuse   Diabetes mellitus type 2, uncomplicated (HCC)   Hypertension   Intertrochanteric fracture of left femur, closed, initial encounter (HCC)   Acute lower UTI   Protein-calorie malnutrition, severe   Moderate major depression:  as per psych. W/ suicidal ideation. Does not have any insight to his condition. Pt's wife has recordings of multiple threats that he has made and a plan to use a handgun. Continue on fluoxetine as per psych. CM is working on getting the pt to [facility] facility still. May need to file involuntary commitment at some point if he continues to be resistant to treatment as per psych   Left hip fracture: s/p left intramedullary nail intertrochanteric as per ortho surg. Norco, morphine prn for pain. PT recs HH   Hypokalemia: KCl repleated   UTI: urine cx is growing insignificant growth. Completed abx course   Likely PAF: continue on eliquis, metoprolol   Alcohol abuse: continue on multivitamin, thiamine, & folic acid    HTN: continue on metoprolol. Lisinopril was d/c    DM2: likely well controlled. Continue on SSI w/ accuchecks   Thrombocytopenia: resolved  Leukocytosis: resolved   DVT prophylaxis: eliquis  Code Status: full  Family Communication: discussed pt's care w/ pt's wife, [name], and answered her questions  Disposition Plan: unclear, possible geri psych. CM is still working on trying to find the pt a geri psych bed   Level of care: Med-Surg  Status is: Inpatient  Remains inpatient appropriate because:unsafe d/c plan  Dispo: The patient is from: Home              Anticipated d/c is to: geri psych              Patient currently: is medically stable for d/c    Difficult to place patient: unclear     Consultants:    Procedures:   Antimicrobials:    Subjective: Pt c/o fatigue   Objective: Vitals:   11/30/20 0751 11/30/20 1509 11/30/20 1946 12/01/20 0447  BP: 115/77 110/72 116/84 110/83  Pulse: 68 75 64 65  Resp: 18 16 16 15   Temp: 98.2 F (36.8 C) 98.1 F (36.7 C) 98.4 F (36.9 C) 98.5 F (36.9 C)  TempSrc:   Oral Oral  SpO2: 99% 100% 100% 100%  Weight:      Height:        Intake/Output Summary (Last 24 hours) at 12/01/2020 0726 Last data filed at 11/30/2020 1855 Gross per 24 hour  Intake 600 ml  Output 350 ml  Net 250 ml   Filed Weights   11/23/20 1438  Weight: 77.1 kg    Examination:  General exam: Appears comfortable  Respiratory system: Clear breath sounds b/l  Cardiovascular system: S1/S2+. No rubs or clicks  Gastrointestinal system: Abd is soft, NT, ND & normal bowel sounds  Central nervous system: Alert and oriented. Moves all extremities  Psychiatry: Judgement and insight appears normal.Flat mood and affect    Data Reviewed: I have personally reviewed following labs and imaging studies  CBC: Recent Labs  Lab 11/27/20 0427 11/28/20 0440 11/29/20 0428 11/30/20 0426 12/01/20 0359  WBC 7.3 5.4 5.4 5.7 6.3  HGB 10.1* 10.0* 10.1* 9.5* 9.7*  HCT 30.3* 29.8* 30.5* 29.1* 30.7*  MCV 100.7* 99.7  100.0 100.7* 100.3*  PLT 135* 150 179 217 255   Basic Metabolic Panel: Recent Labs  Lab 11/27/20 0427 11/28/20 0440 11/29/20 0428 11/30/20 0426 12/01/20 0359  NA 140 140 140 142 139  K 3.8 3.2* 3.3* 3.5 3.4*  CL 102 107 107 109 107  CO2 31 30 26 27 26   GLUCOSE 106* 102* 95 101* 103*  BUN 33* 23 21 21 19   CREATININE 0.69 0.51* 0.50* 0.57* 0.60*  CALCIUM 7.6* 7.4* 7.8* 8.0* 8.0*   GFR: Estimated Creatinine Clearance: 103.1 mL/min (A) (by C-G formula based on SCr of 0.6 mg/dL (L)). Liver Function Tests: No results for input(s): AST, ALT, ALKPHOS, BILITOT, PROT, ALBUMIN in the last 168 hours. No results for input(s): LIPASE, AMYLASE in  the last 168 hours. No results for input(s): AMMONIA in the last 168 hours. Coagulation Profile: No results for input(s): INR, PROTIME in the last 168 hours. Cardiac Enzymes: No results for input(s): CKTOTAL, CKMB, CKMBINDEX, TROPONINI in the last 168 hours. BNP (last 3 results) No results for input(s): PROBNP in the last 8760 hours. HbA1C: No results for input(s): HGBA1C in the last 72 hours.  CBG: Recent Labs  Lab 11/29/20 2149 11/30/20 0748 11/30/20 1210 11/30/20 1719 11/30/20 2103  GLUCAP 138* 100* 117* 102* 103*   Lipid Profile: No results for input(s): CHOL, HDL, LDLCALC, TRIG, CHOLHDL, LDLDIRECT in the last 72 hours. Thyroid Function Tests: No results for input(s): TSH, T4TOTAL, FREET4, T3FREE, THYROIDAB in the last 72 hours. Anemia Panel: No results for input(s): VITAMINB12, FOLATE, FERRITIN, TIBC, IRON, RETICCTPCT in the last 72 hours. Sepsis Labs: No results for input(s): PROCALCITON, LATICACIDVEN in the last 168 hours.  Recent Results (from the past 240 hour(s))  Resp Panel by RT-PCR (Flu A&B, Covid) Nasopharyngeal Swab     Status: None   Collection Time: 11/24/20  4:21 AM   Specimen: Nasopharyngeal Swab; Nasopharyngeal(NP) swabs in vial transport medium  Result Value Ref Range Status   SARS Coronavirus 2 by RT PCR NEGATIVE NEGATIVE Final    Comment: (NOTE) SARS-CoV-2 target nucleic acids are NOT DETECTED.  The SARS-CoV-2 RNA is generally detectable in upper respiratory specimens during the acute phase of infection. The lowest concentration of SARS-CoV-2 viral copies this assay can detect is 138 copies/mL. A negative result does not preclude SARS-Cov-2 infection and should not be used as the sole basis for treatment or other patient management decisions. A negative result may occur with  improper specimen collection/handling, submission of specimen other than nasopharyngeal swab, presence of viral mutation(s) within the areas targeted by this assay, and  inadequate number of viral copies(<138 copies/mL). A negative result must be combined with clinical observations, patient history, and epidemiological information. The expected result is Negative.  Fact Sheet for Patients:  2104  Fact Sheet for Healthcare Providers:  11/26/20  This test is no t yet approved or cleared by the [employer] FDA and  has been authorized for detection and/or diagnosis of SARS-CoV-2 by FDA under an Emergency Use Authorization (EUA). This EUA will remain  in effect (meaning this test can be used) for the duration of the COVID-19 declaration under Section 564(b)(1) of the Act, 21 U.S.C.section 360bbb-3(b)(1), unless the authorization is terminated  or revoked sooner.       Influenza A by PCR NEGATIVE NEGATIVE Final   Influenza B by PCR NEGATIVE NEGATIVE Final    Comment: (NOTE) The Xpert Xpress SARS-CoV-2/FLU/RSV plus assay is intended as an aid in the diagnosis of influenza from Nasopharyngeal  swab specimens and should not be used as a sole basis for treatment. Nasal washings and aspirates are unacceptable for Xpert Xpress SARS-CoV-2/FLU/RSV testing.  Fact Sheet for Patients: BloggerCourse.com  Fact Sheet for Healthcare Providers: SeriousBroker.it  This test is not yet approved or cleared by the [place] FDA and has been authorized for detection and/or diagnosis of SARS-CoV-2 by FDA under an Emergency Use Authorization (EUA). This EUA will remain in effect (meaning this test can be used) for the duration of the COVID-19 declaration under Section 564(b)(1) of the Act, 21 U.S.C. section 360bbb-3(b)(1), unless the authorization is terminated or revoked.  Performed at [facility], [address], [place], [place] [address]   Urine Culture     Status: Abnormal   Collection Time: 11/26/20  8:39 AM   Specimen:  Urine, Clean Catch  Result Value Ref Range Status   Specimen Description   Final    URINE, CLEAN CATCH Performed at [facility], [address] [place], [place] [address]    Special Requests   Final    NONE Performed at [facility], [address], [place], [place] [address]    Culture (A)  Final    <10,000 COLONIES/mL INSIGNIFICANT GROWTH Performed at [facility], [address] [place], [place] [address]    Report Status 11/27/2020 FINAL  Final         Radiology Studies: No results found.      Scheduled Meds:  apixaban  5 mg Oral BID   FLUoxetine  20 mg Oral Daily   folic acid  1 mg Oral Daily   insulin aspart  0-15 Units Subcutaneous TID WC   metoprolol succinate  25 mg Oral Daily   multivitamin with minerals  1 tablet Oral Daily   thiamine  100 mg Oral Daily   Continuous Infusions:  methocarbamol (ROBAXIN) IV       LOS: 7 days    Time spent: 15 mins     Charise Killian, MD [facility] Pager [phone]  If 7PM-7AM, please contact night-coverage 12/01/2020, 7:26 AM

## 2020-12-01 NOTE — Progress Notes (Signed)
Physical Therapy Treatment Patient Details Name: James Sanford MRN: [id number] DOB: [birth year] Today's Date: 12/01/2020    History of Present Illness Per MD notes, pt is a 65 y.o. male who presented to the ER via EMS for evaluation of a fall with a left intertrochanteric hip fx and is currently s/p IM nail WBAT LLE. PMH includes alcohol abuse, diabetes mellitus, hypertension, sleep apnea, paroxysmal atrial fibrillation on anticoagulation. MD assessment includes severe depression with suicidal ideation, UTI, alcohol abuse, and displaced, impacted intertrochanteric fracture of the left femur.    PT Comments    Pt ready for session. To EOB with supervision.  Stood to [employer] and care was provided as he was inc large soft BM in bed prior to arrival.  He was able to complete 2 laps today around unit and elected to skip stairs to allow for increased ambulation.  Gait steady with no LOB or buckling noted.  Remained in recliner after session. Urine is a bit dark and he is encouraged to increase fluids.  Voiced understanding.  RN aware.   Follow Up Recommendations  Home health PT;Supervision for mobility/OOB     Equipment Recommendations  None recommended by PT    Recommendations for Other Services       Precautions / Restrictions Precautions Precautions: Fall Restrictions Weight Bearing Restrictions: Yes LLE Weight Bearing: Weight bearing as tolerated    Mobility  Bed Mobility Overal bed mobility: Needs Assistance Bed Mobility: Supine to Sit     Supine to sit: Modified independent (Device/Increase time)          Transfers Overall transfer level: Needs assistance Equipment used: Rolling walker (2 wheeled) Transfers: Sit to/from Stand Sit to Stand: Supervision            Ambulation/Gait Ambulation/Gait assistance: Supervision Gait Distance (Feet): 350 Feet Assistive device: Rolling walker (2 wheeled) Gait Pattern/deviations: Decreased stance time - left;Decreased  stride length;Step-through pattern;Decreased step length - left;Decreased dorsiflexion - left Gait velocity: decreased       Stairs             Wheelchair Mobility    Modified Rankin (Stroke Patients Only)       Balance Overall balance assessment: Needs assistance Sitting-balance support: No upper extremity supported;Feet supported Sitting balance-Leahy Scale: Normal     Standing balance support: Bilateral upper extremity supported;During functional activity Standing balance-Leahy Scale: Good                              Cognition Arousal/Alertness: Awake/alert Behavior During Therapy: WFL for tasks assessed/performed Overall Cognitive Status: Within Functional Limits for tasks assessed                                 General Comments: pleasant today in good spirits      Exercises      General Comments        Pertinent Vitals/Pain Pain Assessment: No/denies pain    Home Living                      Prior Function            PT Goals (current goals can now be found in the care plan section) Progress towards PT goals: Progressing toward goals    Frequency    7X/week      PT Plan Current plan remains appropriate  Co-evaluation              AM-PAC PT "6 Clicks" Mobility   Outcome Measure  Help needed turning from your back to your side while in a flat bed without using bedrails?: None Help needed moving from lying on your back to sitting on the side of a flat bed without using bedrails?: None Help needed moving to and from a bed to a chair (including a wheelchair)?: A Little Help needed standing up from a chair using your arms (e.g., wheelchair or bedside chair)?: A Little Help needed to walk in hospital room?: A Little Help needed climbing 3-5 steps with a railing? : A Little 6 Click Score: 20    End of Session Equipment Utilized During Treatment: Gait belt Activity Tolerance: Patient tolerated  treatment well Patient left: in chair;with chair alarm set;with call bell/phone within reach Nurse Communication: Mobility status PT Visit Diagnosis: Unsteadiness on feet (R26.81);Other abnormalities of gait and mobility (R26.89);Muscle weakness (generalized) (M62.81);History of falling (Z91.81) Pain - Right/Left: Left Pain - part of body: Hip     Time: 6812-7517 PT Time Calculation (min) (ACUTE ONLY): 13 min  Charges:  $Gait Training: 8-22 mins                    Danielle Dess, PTA 12/01/20, 9:55 AM , 9:52 AM

## 2020-12-02 DIAGNOSIS — I480 Paroxysmal atrial fibrillation: Secondary | ICD-10-CM | POA: Diagnosis not present

## 2020-12-02 DIAGNOSIS — E440 Moderate protein-calorie malnutrition: Secondary | ICD-10-CM | POA: Insufficient documentation

## 2020-12-02 DIAGNOSIS — F321 Major depressive disorder, single episode, moderate: Secondary | ICD-10-CM | POA: Diagnosis not present

## 2020-12-02 DIAGNOSIS — S72142A Displaced intertrochanteric fracture of left femur, initial encounter for closed fracture: Secondary | ICD-10-CM | POA: Diagnosis not present

## 2020-12-02 LAB — BASIC METABOLIC PANEL
Anion gap: 6 (ref 5–15)
BUN: 21 mg/dL (ref 8–23)
CO2: 25 mmol/L (ref 22–32)
Calcium: 8 mg/dL — ABNORMAL LOW (ref 8.9–10.3)
Chloride: 109 mmol/L (ref 98–111)
Creatinine, Ser: 0.51 mg/dL — ABNORMAL LOW (ref 0.61–1.24)
GFR, Estimated: >60 mL/min (ref >60)
Glucose, Bld: 100 mg/dL — ABNORMAL HIGH (ref 70–99)
Potassium: 3.4 mmol/L — ABNORMAL LOW (ref 3.5–5.1)
Sodium: 140 mmol/L (ref 135–145)

## 2020-12-02 LAB — CBC
HCT: 29.6 % — ABNORMAL LOW (ref 39.0–52.0)
Hemoglobin: 9.7 g/dL — ABNORMAL LOW (ref 13.0–17.0)
MCH: 33.1 pg (ref 26.0–34.0)
MCHC: 32.8 g/dL (ref 30.0–36.0)
MCV: 101 fL — ABNORMAL HIGH (ref 80.0–100.0)
Platelets: 272 10*3/uL (ref 150–400)
RBC: 2.93 MIL/uL — ABNORMAL LOW (ref 4.22–5.81)
RDW: 16 % — ABNORMAL HIGH (ref 11.5–15.5)
WBC: 5 10*3/uL (ref 4.0–10.5)
nRBC: 0 % (ref 0.0–0.2)

## 2020-12-02 LAB — VITAMIN B12: Vitamin B-12: 429 pg/mL (ref 180–914)

## 2020-12-02 LAB — GLUCOSE, CAPILLARY: Glucose-Capillary: 86 mg/dL (ref 70–99)

## 2020-12-02 MED ORDER — LOPERAMIDE HCL 2 MG PO CAPS
2.0000 mg | ORAL_CAPSULE | Freq: Every day | ORAL | Status: AC | PRN
  Administered 2020-12-03 – 2020-12-05 (×2): 2 mg via ORAL
  Filled 2020-12-02 (×3): qty 1

## 2020-12-02 MED ORDER — POTASSIUM CHLORIDE CRYS ER 20 MEQ PO TBCR
20.0000 meq | EXTENDED_RELEASE_TABLET | Freq: Once | ORAL | Status: AC
  Administered 2020-12-02: 20 meq via ORAL
  Filled 2020-12-02: qty 1

## 2020-12-02 NOTE — Progress Notes (Signed)
Nutrition Follow-up  DOCUMENTATION CODES:  Non-severe (moderate) malnutrition in context of social or environmental circumstances  INTERVENTION:  Continue current diet as ordered, encourage PO intake Continue MVI, thiamine, and folic acid daily for hx of EtOH abuse Continue Magic cup TID with meals, each supplement provides 290 kcal and 9 grams of protein Request new measured weight If pt remains inpatient, may benefit from having micronutrient labs assessed for hx of poor intake and gastric bypass surgery  NUTRITION DIAGNOSIS:  Moderate Malnutrition (in the context of social/environmental circumstances) related to  (excessive EtOH intake and hx of gastric bypass surgery) as evidenced by mild fat depletion, moderate fat depletion, mild muscle depletion, moderate muscle depletion, severe muscle depletion.  GOAL:  Patient will meet greater than or equal to 90% of their needs  MONITOR:  PO intake, Labs, Weight trends, Skin, Supplement acceptance  REASON FOR ASSESSMENT:  Consult Hip fracture protocol  ASSESSMENT:  65 yo male with a PMH of alcohol abuse, DM, HTN, HLD, hx of gastric bypass, and atrial fibrillation presented to ED with pain after several falls at home while intoxicated. Non-ambulatory at baseline but family reports he attempts to walk when drinking.  In ED, pt initially placed in IVC status due to wife's report of pt making suicidal ideations while intoxicated and showing symptoms of depression. Reported pt has become more violent at home and has a family hx of depression. However, psychiatry discontinued. 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    Imaging in ED did show a displaced, impacted intertrochanteric fracture of the left femur. Taken for surgical repair 8/23.  Per MD, pt medically ready for dc, but case management working on placement for treatment prior to returning home.  Reviewed  intake trends, appears to be eating well this admission, messaged RN about intake of magic cups, awaiting response. Also noted that SBG is being assessed q4h for hx of DM. Last A1c WNL and SBG have not been higher than 152mg /dL in several days. Last insulin administration was 8/27. Mentioned to MD in case frequency of finger sticks could be adjusted.  No new weights have been obtained since admission, requested new measured to assess for loss.     Average Meal Intake: 8/23-8/29: 79% intake x 15 recorded meals (40-100%)  Nutritionally Relevant Medications: Scheduled Meds:  folic acid  1 mg Oral Daily   insulin aspart  0-15 Units Subcutaneous TID WC   multivitamin with minerals  1 tablet Oral Daily   potassium chloride  20 mEq Oral Once   thiamine  100 mg Oral Daily   PRN Meds: metoCLOPramide, ondansetron   Labs Reviewed: K 3.4 Creatinine .51 SBG ranges from 85-152 mg/dL over the last 24 hours HgbA1c 5.3% (8/21)  NUTRITION - FOCUSED PHYSICAL EXAM: Flowsheet Row Most Recent Value  Orbital Region Moderate depletion  Upper Arm Region Mild depletion  Thoracic and Lumbar Region No depletion  Buccal Region Mild depletion  Temple Region Moderate depletion  Clavicle Bone Region Severe depletion  Clavicle and Acromion Bone Region Severe depletion  Scapular Bone Region Unable to assess  Dorsal Hand Mild depletion  Patellar Region Mild depletion  Anterior Thigh Region Mild depletion  Posterior Calf Region Mild depletion  Edema (RD Assessment) None  Hair Reviewed  Eyes Reviewed  Mouth Reviewed  Skin Reviewed  Nails Reviewed   Diet Order:   Diet Order  Diet regular Room service appropriate? Yes; Fluid consistency: Thin  Diet effective now                   EDUCATION NEEDS:  Education needs have been addressed  Skin:  Skin Assessment: Reviewed RN Assessment (surgical incisions to the left hip/leg, bruising to bilateral arms)  Last BM:  8/29 - type 5  Height:   Ht Readings from Last 1 Encounters:  11/23/20 6' (1.829 m)   Weight:  Wt Readings from Last 1 Encounters:  11/23/20 77.1 kg    Ideal Body Weight:  80.9 kg  BMI:  Body mass index is 23.06 kg/m.  Estimated Nutritional Needs:  Kcal:  2200-2400 Protein:  110-120 g/d Fluid:  >2.3 L/d   Greig Castilla, RD, LDN Clinical Dietitian Pager on Amion

## 2020-12-02 NOTE — Progress Notes (Signed)
PROGRESS NOTE   HPI was taken from Dr. Joylene Igo: James Sanford is a 65 y.o. male with medical history significant for alcohol abuse, diabetes mellitus, hypertension, sleep apnea, paroxysmal atrial fibrillation on anticoagulation who presents to the ER via EMS for evaluation of a fall. His wife states he had about 6 falls 2 days prior to his hospitalization while he was intoxicated. He presented to the ER with complaints of left knee pain but denied having any head injury. Patient's wife was at the bedside states that patient has been drinking a lot more recently and is very depressed.  He has suicidal ideations with plans to kill himself with a handgun.  She states that patient's father also committed suicide.  She is concerned that he has become increasingly violent and is worried about her safety. Patient was committed in the ER and was seen by psychiatry, his IVC has been discontinued but patient will need discharge to a Geri psych area for management of severe depression once stabilized. Patient had an x-ray of the left hip which shows a displaced, impacted intertrochanteric fracture of the left femur. Wife states that because of his frequent falls while intoxicated he has had a fracture of his hand and of his leg.  She is requesting that patient get help for his substance dependence. 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 He denies having any chest pain, no shortness of breath, no nausea, no vomiting, no dizziness, no lightheadedness, no palpitations, no diaphoresis, no urinary frequency, no nocturia, no dysuria. 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  No acute bony abnormalities. CT scan of the head without contrast shows no acute intracranial pathology.  Mild chronic microvascular ischemic changes. Left hip x-ray shows displaced, impacted intertrochanteric fracture of the proximal left femur. Twelve-lead EKG reviewed by me shows atrial fibrillation with a left bundle branch block.   Hospital course from Dr. Mayford Knife 8/24-8/30/22: Pt presented after fall and was found to have left hip fracture. Pt is s/p left intramedullary nail intertrochanteric as per ortho surg. Of note, pt was moderate major depression and was evidently having suicidal threats/ideations at home as per pt's wife. Pt was initially refusing all psych meds and even talking about his depression but since that time been taking fluoxetine as prescribed daily. Psych is following and CM is looking for geri psych bed but has been unsuccessful yet. For more information, pleas see previous progress/consult notes.    James Sanford  GBT:[id number] DOB: Aug 13, 1955 DOA: 11/23/2020 PCP: Jerl Mina, MD   Assessment & Plan:   Principal Problem:   Moderate major depression, single episode (HCC) Active Problems:   Alcohol abuse   Diabetes mellitus type 2, uncomplicated (HCC)   Hypertension   Intertrochanteric fracture of left femur, closed, initial encounter (HCC)   Acute lower UTI   Protein-calorie malnutrition, severe   Moderate major depression:  as per psych. W/ suicidal ideation. Does not have any insight to his condition. Pt's wife has recordings of multiple threats that he has made and a plan to use a handgun. Continue on fluoxetine as per psych. CM is working on getting the pt to [facility] facility still. May need to file  involuntary commitment at some point if he continues to be resistant to treatment as per psych   Left hip fracture: s/p left intramedullary nail intertrochanteric as per ortho surg. Norco, morphine prn for pain. PT recs HH    Hypokalemia: potassium given    UTI: urine cx is growing insignificant growth. Completed abx course   Likely PAF: continue on metoprolol, eliquis   Alcohol abuse: continue on thiamine, folic acid & multivitamin  Chronic intermittent diarrhea: had 2 colonoscopies in the past that were inconclusive. Continue w/ supportive care    HTN: continue on metoprolol. Lisinopril was d/c    DM2: well controlled. D/c SSI w/ accuecks as pt has not required insulin   Thrombocytopenia: resolved  Leukocytosis: resolved   DVT prophylaxis: eliquis  Code Status: full  Family Communication: discussed pt's care w/ pt's wife, [name], and answered her questions  Disposition Plan: unclear, possible geri psych. CM is still working on trying to find the pt a geri psych bed   Level of care: Med-Surg  Status is: Inpatient  Remains inpatient appropriate because:unsafe d/c plan  Dispo: The patient is from: Home              Anticipated d/c is to: geri psych              Patient currently: is medically stable for d/c    Difficult to place patient: unclear    Consultants:    Procedures:   Antimicrobials:    Subjective: Pt c/o malaise  Objective: Vitals:   12/01/20 0804 12/01/20 1652 12/01/20 2321 12/02/20 0509  BP: 107/74 112/80 115/80 106/69  Pulse: 64 67 70 66  Resp: 16 16 16 17   Temp: 98.8 F (37.1 C) 99.1 F (37.3 C) 98.3 F (36.8 C) 98 F (36.7 C)  TempSrc: Oral     SpO2: 100% 100% 99% 100%  Weight:      Height:        Intake/Output Summary (Last 24 hours) at 12/02/2020 0742 Last data filed at 12/01/2020 2200 Gross per 24 hour  Intake 0 ml  Output 110 ml  Net -110 ml   Filed Weights   11/23/20 1438  Weight: 77.1 kg    Examination:  General exam: Appears calm & comfortable  Respiratory system: Clear breath sounds b/l  Cardiovascular system: S1 & S2+. No rubs or clicks Gastrointestinal system: Abd is soft, NT, ND & hyperactive bowel sounds  Central nervous system:  alert and oriented. Moves all extremities   Psychiatry: Judgement and insight appears normal. Flat mood and affect    Data Reviewed: I have personally reviewed following labs and imaging studies  CBC: Recent Labs  Lab 11/28/20 0440 11/29/20 0428 11/30/20 0426 12/01/20 0359 12/02/20 0429  WBC 5.4 5.4 5.7 6.3 5.0  HGB 10.0* 10.1* 9.5* 9.7* 9.7*  HCT 29.8* 30.5* 29.1* 30.7* 29.6*  MCV 99.7 100.0 100.7* 100.3* 101.0*  PLT 150 179 217 255 272   Basic Metabolic Panel: Recent Labs  Lab 11/28/20 0440 11/29/20 0428 11/30/20 0426 12/01/20 0359 12/02/20 0429  NA 140 140 142 139 140  K 3.2* 3.3* 3.5 3.4* 3.4*  CL 107 107 109 107 109  CO2 30 26 27 26 25   GLUCOSE 102* 95 101* 103* 100*  BUN 23 21 21 19 21   CREATININE 0.51* 0.50* 0.57* 0.60* 0.51*  CALCIUM 7.4* 7.8* 8.0* 8.0* 8.0*   GFR: Estimated Creatinine Clearance: 103.1 mL/min (A) (by C-G formula based on SCr of 0.51 mg/dL (L)). Liver Function  Tests: No results for input(s): AST, ALT, ALKPHOS, BILITOT, PROT, ALBUMIN in the last 168 hours. No results for input(s): LIPASE, AMYLASE in the last 168 hours. No results for input(s): AMMONIA in the last 168 hours. Coagulation Profile: No results for input(s): INR, PROTIME in the last 168 hours. Cardiac Enzymes: No results for input(s): CKTOTAL, CKMB, CKMBINDEX, TROPONINI in the last 168 hours. BNP (last 3 results) No results for input(s): PROBNP in the last 8760 hours. HbA1C: No results for input(s): HGBA1C in the last 72 hours.  CBG: Recent Labs  Lab 11/30/20 2103 12/01/20 0731 12/01/20 1220 12/01/20 1823 12/01/20 2202  GLUCAP 103* 100* 85 152* 97   Lipid Profile: No results for input(s): CHOL, HDL, LDLCALC, TRIG, CHOLHDL, LDLDIRECT in the last 72 hours. Thyroid Function Tests: No results for input(s): TSH, T4TOTAL, FREET4, T3FREE, THYROIDAB in the last 72 hours. Anemia Panel: No results for input(s): VITAMINB12, FOLATE, FERRITIN, TIBC, IRON, RETICCTPCT in the last  72 hours. Sepsis Labs: No results for input(s): PROCALCITON, LATICACIDVEN in the last 168 hours.  Recent Results (from the past 240 hour(s))  Resp Panel by RT-PCR (Flu A&B, Covid) Nasopharyngeal Swab     Status: None   Collection Time: 11/24/20  4:21 AM   Specimen: Nasopharyngeal Swab; Nasopharyngeal(NP) swabs in vial transport medium  Result Value Ref Range Status   SARS Coronavirus 2 by RT PCR NEGATIVE NEGATIVE Final    Comment: (NOTE) SARS-CoV-2 target nucleic acids are NOT DETECTED.  The SARS-CoV-2 RNA is generally detectable in upper respiratory specimens during the acute phase of infection. The lowest concentration of SARS-CoV-2 viral copies this assay can detect is 138 copies/mL. A negative result does not preclude SARS-Cov-2 infection and should not be used as the sole basis for treatment or other patient management decisions. A negative result may occur with  improper specimen collection/handling, submission of specimen other than nasopharyngeal swab, presence of viral mutation(s) within the areas targeted by this assay, and inadequate number of viral copies(<138 copies/mL). A negative result must be combined with clinical observations, patient history, and epidemiological information. The expected result is Negative.  Fact Sheet for Patients:  BloggerCourse.com  Fact Sheet for Healthcare Providers:  SeriousBroker.it  This test is no t yet approved or cleared by the [place] FDA and  has been authorized for detection and/or diagnosis of SARS-CoV-2 by FDA under an Emergency Use Authorization (EUA). This EUA will remain  in effect (meaning this test can be used) for the duration of the COVID-19 declaration under Section 564(b)(1) of the Act, 21 U.S.C.section 360bbb-3(b)(1), unless the authorization is terminated  or revoked sooner.       Influenza A by PCR NEGATIVE NEGATIVE Final   Influenza B by PCR NEGATIVE  NEGATIVE Final    Comment: (NOTE) The Xpert Xpress SARS-CoV-2/FLU/RSV plus assay is intended as an aid in the diagnosis of influenza from Nasopharyngeal swab specimens and should not be used as a sole basis for treatment. Nasal washings and aspirates are unacceptable for Xpert Xpress SARS-CoV-2/FLU/RSV testing.  Fact Sheet for Patients: BloggerCourse.com  Fact Sheet for Healthcare Providers: SeriousBroker.it  This test is not yet approved or cleared by the [place] FDA and has been authorized for detection and/or diagnosis of SARS-CoV-2 by FDA under an Emergency Use Authorization (EUA). This EUA will remain in effect (meaning this test can be used) for the duration of the COVID-19 declaration under Section 564(b)(1) of the Act, 21 U.S.C. section 360bbb-3(b)(1), unless the authorization is terminated or revoked.  Performed at [facility], [address], [place], [place] [address]   Urine Culture     Status: Abnormal   Collection Time: 11/26/20  8:39 AM   Specimen: Urine, Clean Catch  Result Value Ref Range Status   Specimen Description   Final    URINE, CLEAN CATCH Performed at [facility], [address] [place], [place] [address]    Special Requests   Final    NONE Performed at [facility], [address] [place], [place] [address]    Culture (A)  Final    <10,000 COLONIES/mL INSIGNIFICANT GROWTH Performed at [facility], [address], [place], [place] [address]    Report Status 11/27/2020 FINAL  Final         Radiology Studies: No results found.      Scheduled Meds:  apixaban  5 mg Oral BID   FLUoxetine  20 mg Oral Daily   folic acid  1 mg Oral Daily   insulin aspart  0-15 Units Subcutaneous TID WC   metoprolol succinate  25 mg Oral Daily   multivitamin with minerals  1 tablet Oral Daily   thiamine  100 mg Oral Daily   Continuous Infusions:   methocarbamol (ROBAXIN) IV       LOS: 8 days    Time spent: 15 mins     Charise Killian, MD [facility] Pager [phone]  If 7PM-7AM, please contact night-coverage 12/02/2020, 7:42 AM

## 2020-12-02 NOTE — TOC Progression Note (Addendum)
Transition of Care [facility]) - Progression Note    Patient Details  Name: James Sanford MRN: [id number] Date of Birth: 01/31/1956  [facility] CM/SW Contact  Caryn Section, RN Phone Number: 12/02/2020, 10:44 AM  Clinical Narrative:   Continue to search for geri-psych, no bed offers to date, no response from [facility] or [address].  RNCM spoke to wife, she states she will contact patient to discuss placement.  She states she would like to speak to Dr. Toni Amend, however he is out on leave this week.      Will continue to search for placement  TOC contact information given, TOC to follow to discharge.   Addendum:  [facility] notified RNCM that they do not take referrals outside of [facility], thus they cannot accept this patient      Expected Discharge Plan and Services                                                 Social Determinants of Health (SDOH) Interventions    Readmission Risk Interventions No flowsheet data found.

## 2020-12-02 NOTE — Progress Notes (Signed)
Physical Therapy Treatment Patient Details Name: James Sanford MRN: [id number] DOB: [birth year] Today's Date: 12/02/2020    History of Present Illness Per MD notes, pt is a 65 y.o. male who presented to the ER via EMS for evaluation of a fall with a left intertrochanteric hip fx and is currently s/p IM nail WBAT LLE. PMH includes alcohol abuse, diabetes mellitus, hypertension, sleep apnea, paroxysmal atrial fibrillation on anticoagulation. MD assessment includes severe depression with suicidal ideation, UTI, alcohol abuse, and displaced, impacted intertrochanteric fracture of the left femur.    PT Comments    OOB, completes 2 laps, and stair training with ease.  Participated in exercises as described below.   Follow Up Recommendations  Home health PT;Supervision for mobility/OOB     Equipment Recommendations  Rolling walker with 5" wheels    Recommendations for Other Services       Precautions / Restrictions Precautions Precautions: Fall Restrictions Weight Bearing Restrictions: Yes LLE Weight Bearing: Weight bearing as tolerated    Mobility  Bed Mobility Overal bed mobility: Modified Independent Bed Mobility: Supine to Sit     Supine to sit: Modified independent (Device/Increase time)          Transfers Overall transfer level: Needs assistance Equipment used: Rolling walker (2 wheeled) Transfers: Sit to/from Stand Sit to Stand: Supervision            Ambulation/Gait Ambulation/Gait assistance: Supervision Gait Distance (Feet): 350 Feet Assistive device: Rolling walker (2 wheeled)   Gait velocity: decreased       Stairs Stairs: Yes Stairs assistance: Min guard Stair Management: One rail Right;Step to pattern;Forwards;Backwards Number of Stairs: 8 General stair comments: prefers to come backwards down stairs.  needs continued cues/education for sequencing   Wheelchair Mobility    Modified Rankin (Stroke Patients Only)       Balance  Overall balance assessment: Needs assistance Sitting-balance support: No upper extremity supported;Feet supported Sitting balance-Leahy Scale: Normal     Standing balance support: Bilateral upper extremity supported;During functional activity Standing balance-Leahy Scale: Good Standing balance comment: Some reliance UE support through RW                            Cognition Arousal/Alertness: Awake/alert Behavior During Therapy: WFL for tasks assessed/performed Overall Cognitive Status: Within Functional Limits for tasks assessed                                 General Comments: pleasant today in good spirits      Exercises Other Exercises Other Exercises: standing ex 2 x 10 LLE    General Comments        Pertinent Vitals/Pain Pain Assessment: No/denies pain    Home Living                      Prior Function            PT Goals (current goals can now be found in the care plan section) Progress towards PT goals: Progressing toward goals    Frequency    7X/week      PT Plan Current plan remains appropriate    Co-evaluation              AM-PAC PT "6 Clicks" Mobility   Outcome Measure  Help needed turning from your back to your side while in a flat bed without using bedrails?:  None Help needed moving from lying on your back to sitting on the side of a flat bed without using bedrails?: None Help needed moving to and from a bed to a chair (including a wheelchair)?: A Little Help needed standing up from a chair using your arms (e.g., wheelchair or bedside chair)?: A Little Help needed to walk in hospital room?: A Little Help needed climbing 3-5 steps with a railing? : A Little 6 Click Score: 20    End of Session Equipment Utilized During Treatment: Gait belt Activity Tolerance: Patient tolerated treatment well Patient left: in chair;with chair alarm set;with call bell/phone within reach Nurse Communication: Mobility  status PT Visit Diagnosis: Unsteadiness on feet (R26.81);Other abnormalities of gait and mobility (R26.89);Muscle weakness (generalized) (M62.81);History of falling (Z91.81) Pain - Right/Left: Left Pain - part of body: Hip     Time: 2094-7096 PT Time Calculation (min) (ACUTE ONLY): 16 min  Charges:  $Gait Training: 8-22 mins                    Danielle Dess, PTA 12/02/20, 10:02 AM , 10:01 AM

## 2020-12-02 NOTE — Plan of Care (Signed)
  Problem: Education: Goal: Verbalization of understanding the information provided (i.e., activity precautions, restrictions, etc) will improve Outcome: Progressing Goal: Individualized Educational Video(s) Outcome: Progressing   Problem: Clinical Measurements: Goal: Postoperative complications will be avoided or minimized Outcome: Progressing   Problem: Self-Concept: Goal: Ability to maintain and perform role responsibilities to the fullest extent possible will improve Outcome: Progressing

## 2020-12-02 NOTE — TOC Progression Note (Addendum)
Transition of Care [facility]) - Progression Note    Patient Details  Name: James Sanford MRN: [id number] Date of Birth: 1955-09-20  Transition of Care [facility]) CM/SW Contact  Caryn Section, RN Phone Number: 12/02/2020, 3:28 PM  Clinical Narrative:   Ephriam Knuckles from [facility] asks for [facility] to fax referral today, and they will communicate a decision regarding their ability to accommodate this patient by tomorrow.  Patient's wife and care team aware.  TOC will follow to discharge. [facility] [address],  [place], [place] [address] [phone]-Fax       Expected Discharge Plan and Services                                                 Social Determinants of Health (SDOH) Interventions    Readmission Risk Interventions No flowsheet data found.

## 2020-12-03 DIAGNOSIS — R197 Diarrhea, unspecified: Secondary | ICD-10-CM

## 2020-12-03 DIAGNOSIS — I480 Paroxysmal atrial fibrillation: Secondary | ICD-10-CM | POA: Diagnosis not present

## 2020-12-03 DIAGNOSIS — S72145A Nondisplaced intertrochanteric fracture of left femur, initial encounter for closed fracture: Secondary | ICD-10-CM

## 2020-12-03 DIAGNOSIS — F321 Major depressive disorder, single episode, moderate: Secondary | ICD-10-CM | POA: Diagnosis not present

## 2020-12-03 DIAGNOSIS — R7301 Impaired fasting glucose: Secondary | ICD-10-CM

## 2020-12-03 DIAGNOSIS — F1010 Alcohol abuse, uncomplicated: Secondary | ICD-10-CM | POA: Diagnosis not present

## 2020-12-03 NOTE — Progress Notes (Signed)
Patient refusing am labs.

## 2020-12-03 NOTE — Progress Notes (Signed)
Refused am labs. States " I'm leaving today. " Explained that MD may need to make sure labs ok first but continued to refuse. A/O X 4.

## 2020-12-03 NOTE — Progress Notes (Signed)
Physical Therapy Treatment Patient Details Name: James Sanford MRN: [id number] DOB: Sep 06, 1955 Today's Date: 12/03/2020    History of Present Illness Per MD notes, pt is a 65 y.o. male who presented to the ER via EMS for evaluation of a fall with a left intertrochanteric hip fx and is currently s/p IM nail WBAT LLE. PMH includes alcohol abuse, diabetes mellitus, hypertension, sleep apnea, paroxysmal atrial fibrillation on anticoagulation. MD assessment includes severe depression with suicidal ideation, UTI, alcohol abuse, and displaced, impacted intertrochanteric fracture of the left femur.    PT Comments    Pt was sitting in recliner upon arriving. He agrees to PT session and is cooperative and pleasant throughout. MD arrived to discuss DC disposition. Pt was easily able to stand and ambulate 400 ft with RW. Also performed ascending/descending 4 stair 2 x with side stepping and BUE support on +1  (L) rail. Overall pt tolerated session well. Awaiting placement to geri-psych. Will benefit from ongoing skilled PT to improve balance, strength, and mobility. At [facility], pt was able to ambulate without AD.     Follow Up Recommendations  Home health PT;Supervision for mobility/OOB     Equipment Recommendations  Rolling walker with 5" wheels       Precautions / Restrictions Precautions Precautions: Fall Restrictions Weight Bearing Restrictions: Yes LLE Weight Bearing: Weight bearing as tolerated    Mobility  Bed Mobility    General bed mobility comments: In recliner pre/post session    Transfers Overall transfer level: Needs assistance Equipment used: Rolling walker (2 wheeled) Transfers: Sit to/from Stand Sit to Stand: Supervision     Ambulation/Gait Ambulation/Gait assistance: Supervision;Min guard Gait Distance (Feet): 400 Feet Assistive device: Rolling walker (2 wheeled) Gait Pattern/deviations: Step-through pattern;Trunk flexed;Decreased stride length Gait velocity:  decreased   General Gait Details: pt has no balance deficits with use of RW however attempted ambulation with +1 UE support. Pt struggles without BUE support.   Stairs Stairs: Yes Stairs assistance: Min guard Stair Management: Sideways;Step to pattern;One rail Left Number of Stairs: 8 General stair comments: pt was able to ascend/descend 4 stair 2 x using side stepping and BUE support on rails.    Balance Overall balance assessment: Needs assistance Sitting-balance support: No upper extremity supported;Feet supported Sitting balance-Leahy Scale: Normal     Standing balance support: Bilateral upper extremity supported;During functional activity Standing balance-Leahy Scale: Good Standing balance comment: poor without BUE support but no LOB with BUE support      Cognition Arousal/Alertness: Awake/alert Behavior During Therapy: WFL for tasks assessed/performed Overall Cognitive Status: Within Functional Limits for tasks assessed      General Comments: pleasant today in good spirits      Exercises Total Joint Exercises Ankle Circles/Pumps: AROM;Strengthening;Both;10 reps;Supine Quad Sets: AROM;Strengthening;Both;10 reps;Supine Gluteal Sets: AROM;Strengthening;Both;10 reps;Supine Towel Squeeze: AROM;Strengthening;Both;10 reps;Seated;Other (comment) Heel Slides: AROM;Strengthening;Left;10 reps;Supine Hip ABduction/ADduction: AROM;Strengthening;Left;10 reps;Supine Straight Leg Raises: AROM;10 reps;Strengthening;Supine        Pertinent Vitals/Pain Pain Assessment: No/denies pain Pain Score: 0-No pain     PT Goals (current goals can now be found in the care plan section) Acute Rehab PT Goals Patient Stated Goal: I want to get better Progress towards PT goals: Progressing toward goals    Frequency    7X/week      PT Plan Current plan remains appropriate       AM-PAC PT "6 Clicks" Mobility   Outcome Measure  Help needed turning from your back to your side  while in a flat bed without  using bedrails?: None Help needed moving from lying on your back to sitting on the side of a flat bed without using bedrails?: None Help needed moving to and from a bed to a chair (including a wheelchair)?: A Little Help needed standing up from a chair using your arms (e.g., wheelchair or bedside chair)?: A Little Help needed to walk in hospital room?: A Little Help needed climbing 3-5 steps with a railing? : A Little 6 Click Score: 20    End of Session Equipment Utilized During Treatment: Gait belt Activity Tolerance: Patient tolerated treatment well Patient left: in chair;with chair alarm set;with call bell/phone within reach Nurse Communication: Mobility status PT Visit Diagnosis: Unsteadiness on feet (R26.81);Other abnormalities of gait and mobility (R26.89);Muscle weakness (generalized) (M62.81);History of falling (Z91.81) Pain - Right/Left: Left Pain - part of body: Hip     Time: 6195-0932 PT Time Calculation (min) (ACUTE ONLY): 20 min  Charges:  $Gait Training: 8-22 mins                    Jetta Lout PTA 12/03/20, 1:16 PM

## 2020-12-03 NOTE — Progress Notes (Signed)
OT Cancellation Note  Patient Details Name: James Sanford MRN: [id number] DOB: [birth year]   Cancelled Treatment:    Reason Eval/Treat Not Completed: OT screened, no needs identified, will sign off;Patient declined, no reason specified. Order received, chart reviewed. Therapist enter room, provided short introduction re: role of OT. Patient adamantly insisted he did not need or want OT services, that he had no problems with mobility issues, that he did not need/want any information about alcohol misuse or depression, that neither of those conditions affected him, and that therapist should leave his room. Given pt's refusal of services, will sign off now. Should pt's needs change, we will be happy to attempt an OT evaluation at later date.  Latina Craver, PhD, MS, OTR/L 12/03/20, 2:42 PM
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
## 2020-12-04 DIAGNOSIS — I10 Essential (primary) hypertension: Secondary | ICD-10-CM | POA: Diagnosis not present

## 2020-12-04 DIAGNOSIS — I480 Paroxysmal atrial fibrillation: Secondary | ICD-10-CM | POA: Diagnosis not present

## 2020-12-04 DIAGNOSIS — S72145A Nondisplaced intertrochanteric fracture of left femur, initial encounter for closed fracture: Secondary | ICD-10-CM | POA: Diagnosis not present

## 2020-12-04 DIAGNOSIS — F321 Major depressive disorder, single episode, moderate: Secondary | ICD-10-CM | POA: Diagnosis not present

## 2020-12-04 LAB — BASIC METABOLIC PANEL
Anion gap: 6 (ref 5–15)
BUN: 23 mg/dL (ref 8–23)
CO2: 25 mmol/L (ref 22–32)
Calcium: 7.9 mg/dL — ABNORMAL LOW (ref 8.9–10.3)
Chloride: 109 mmol/L (ref 98–111)
Creatinine, Ser: 0.63 mg/dL (ref 0.61–1.24)
GFR, Estimated: >60 mL/min (ref >60)
Glucose, Bld: 98 mg/dL (ref 70–99)
Potassium: 3.2 mmol/L — ABNORMAL LOW (ref 3.5–5.1)
Sodium: 140 mmol/L (ref 135–145)

## 2020-12-04 LAB — IRON AND TIBC
Iron: 30 ug/dL — ABNORMAL LOW (ref 45–182)
Saturation Ratios: 12 % — ABNORMAL LOW (ref 17.9–39.5)
TIBC: 259 ug/dL (ref 250–450)
UIBC: 229 ug/dL

## 2020-12-04 LAB — FERRITIN: Ferritin: 54 ng/mL (ref 24–336)

## 2020-12-04 LAB — TSH: TSH: 3.515 u[IU]/mL (ref 0.350–4.500)

## 2020-12-04 LAB — HEMOGLOBIN: Hemoglobin: 9.8 g/dL — ABNORMAL LOW (ref 13.0–17.0)

## 2020-12-04 MED ORDER — FERROUS SULFATE 325 (65 FE) MG PO TABS
325.0000 mg | ORAL_TABLET | Freq: Every day | ORAL | Status: DC
  Administered 2020-12-04 – 2020-12-23 (×20): 325 mg via ORAL
  Filled 2020-12-04 (×20): qty 1

## 2020-12-04 NOTE — Progress Notes (Signed)
Physical Therapy Treatment Patient Details Name: James Sanford MRN: [id number] DOB: [birth year] Today's Date: 12/04/2020    History of Present Illness Per MD notes, pt is a 65 y.o. male who presented to the ER via EMS for evaluation of a fall with a left intertrochanteric hip fx and is currently s/p IM nail WBAT LLE. PMH includes alcohol abuse, diabetes mellitus, hypertension, sleep apnea, paroxysmal atrial fibrillation on anticoagulation. MD assessment includes severe depression with suicidal ideation, UTI, alcohol abuse, and displaced, impacted intertrochanteric fracture of the left femur.    PT Comments    Pt was sitting in recliner upon arriving. Agrees to PT session requesting to go outside.Pt was able to stand, ambulate with RW inside/outside without LOB or unsteadiness. Still unable to safely ambulate without AD due to pain slight knee buckling. Overall pt continues to improve but will also continue to benefit from skilled PT to progress to PLOF.    Follow Up Recommendations  Home health PT;Supervision for mobility/OOB;Other (comment) (pt planning to DC to geri-psych unit)     Equipment Recommendations  Rolling walker with 5" wheels       Precautions / Restrictions Precautions Precautions: Fall Restrictions Weight Bearing Restrictions: Yes LLE Weight Bearing: Weight bearing as tolerated    Mobility  Bed Mobility    General bed mobility comments: in recliner/pre-post session    Transfers Overall transfer level: Needs assistance Equipment used: Rolling walker (2 wheeled) Transfers: Sit to/from Stand Sit to Stand: Supervision            Ambulation/Gait Ambulation/Gait assistance: Supervision Gait Distance (Feet): 300 Feet Assistive device: Rolling walker (2 wheeled) Gait Pattern/deviations: Step-through pattern Gait velocity: decreased   General Gait Details: Pt was easily able to ambulate 300 ft on level and unlevel surfaces. p ambulated outside with RW  without LOB or unsteadiness. continues to be unsafe to ambulate without AD due to pain.     Balance Overall balance assessment: Needs assistance Sitting-balance support: No upper extremity supported;Feet supported Sitting balance-Leahy Scale: Normal     Standing balance support: Bilateral upper extremity supported;During functional activity Standing balance-Leahy Scale: Good Standing balance comment: poor without BUE support but no LOB with BUE support      Cognition Arousal/Alertness: Awake/alert Behavior During Therapy: WFL for tasks assessed/performed Overall Cognitive Status: Within Functional Limits for tasks assessed    General Comments: pleasant today in good spirits             Pertinent Vitals/Pain Pain Assessment: No/denies pain Pain Score: 0-No pain     PT Goals (current goals can now be found in the care plan section) Acute Rehab PT Goals Patient Stated Goal: I want to get better Progress towards PT goals: Progressing toward goals    Frequency    7X/week      PT Plan Current plan remains appropriate       AM-PAC PT "6 Clicks" Mobility   Outcome Measure  Help needed turning from your back to your side while in a flat bed without using bedrails?: None Help needed moving from lying on your back to sitting on the side of a flat bed without using bedrails?: None Help needed moving to and from a bed to a chair (including a wheelchair)?: A Little Help needed standing up from a chair using your arms (e.g., wheelchair or bedside chair)?: A Little Help needed to walk in hospital room?: A Little Help needed climbing 3-5 steps with a railing? : A Little 6 Click Score: 20  End of Session Equipment Utilized During Treatment: Gait belt Activity Tolerance: Patient tolerated treatment well Patient left: in chair;with chair alarm set;with call bell/phone within reach Nurse Communication: Mobility status PT Visit Diagnosis: Unsteadiness on feet (R26.81);Other  abnormalities of gait and mobility (R26.89);Muscle weakness (generalized) (M62.81);History of falling (Z91.81) Pain - Right/Left: Left Pain - part of body: Hip     Time: 1325-1350 PT Time Calculation (min) (ACUTE ONLY): 25 min  Charges:  $Gait Training: 8-22 mins                     Jetta Lout PTA 12/04/20, 2:16 PM

## 2020-12-04 NOTE — Progress Notes (Signed)
Patient ID: James Sanford, male   DOB: 1956-01-09, 65 y.o.   MRN: [id number] [facility] PROGRESS NOTE  Macari Zalesky Curto YIA:[id number] DOB: Aug 21, 1955 DOA: 11/23/2020 PCP: Jerl Mina, MD  HPI/Subjective: Patient feels okay.  Offers no complaints.  Awaiting Geri psych placement.  Admitted 10 days ago with fall and found to have a hip fracture that was repaired.  Patient walked 300 feet today with physical therapy.  Objective: Vitals:   12/04/20 0753 12/04/20 1500  BP: 123/84 108/78  Pulse: 60 71  Resp: 15 15  Temp: 97.9 F (36.6 C) 98.3 F (36.8 C)  SpO2: 100% 100%    Intake/Output Summary (Last 24 hours) at 12/04/2020 1643 Last data filed at 12/04/2020 1355 Gross per 24 hour  Intake 720 ml  Output --  Net 720 ml   Filed Weights   11/23/20 1438  Weight: 77.1 kg    ROS: Review of Systems  Respiratory:  Negative for shortness of breath.   Cardiovascular:  Negative for chest pain.  Gastrointestinal:  Negative for abdominal pain, nausea and vomiting.  Exam: Physical Exam HENT:     Head: Normocephalic.     Mouth/Throat:     Pharynx: No oropharyngeal exudate.  Eyes:     General: Lids are normal.     Conjunctiva/sclera: Conjunctivae normal.  Cardiovascular:     Rate and Rhythm: Normal rate and regular rhythm.     Heart sounds: Normal heart sounds, S1 normal and S2 normal.  Pulmonary:     Breath sounds: No decreased breath sounds, wheezing, rhonchi or rales.  Abdominal:     Palpations: Abdomen is soft.     Tenderness: There is no abdominal tenderness.  Musculoskeletal:     Right lower leg: No swelling.     Left lower leg: No swelling.  Skin:    General: Skin is warm.     Findings: No rash.  Neurological:     Mental Status: He is alert.     Comments: Answers questions appropriately.  Does not elaborate much.      Scheduled Meds:  apixaban  5 mg Oral BID   ferrous sulfate  325 mg Oral Q breakfast   FLUoxetine  20 mg Oral Daily   folic  acid  1 mg Oral Daily   metoprolol succinate  25 mg Oral Daily   multivitamin with minerals  1 tablet Oral Daily   thiamine  100 mg Oral Daily    Assessment/Plan:  Major depression with initial suicidal ideation.  Patient poor insight with condition.  The patient's wife is scared to take him home at this point.  Patient on fluoxetine.  Awaiting Geri psych facility.  TSH normal range. Left hip intertrochanteric fracture status post left intramedullary nail fixation.  Operating room on 8/23 by Dr. Odis Luster.  Walked 300 feet with physical therapy today. Paroxysmal atrial fibrillation on metoprolol for rate control and Eliquis for anticoagulation History of alcohol abuse on thiamine, folic acid and multivitamin UTI ruled out Hypokalemia replaced during the hospital course Chronic intermittent diarrhea Essential hypertension on metoprolol Impaired fasting glucose     Code Status:     Code Status Orders  (From admission, onward)           Start     Ordered   11/24/20 1406  Full code  Continuous        11/24/20 1407           Code Status History     Date Active Date  Inactive Code Status Order ID Comments User Context   11/23/2020 2136 11/24/2020 1407 Full Code [id number]  Merwyn Katos, MD ED   01/25/2020 2118 02/06/2020 2208 Full Code [id number]  Rometta Emery, MD ED   10/29/2017 1711 10/31/2017 1920 Full Code [id number]  Enid Baas, MD Inpatient      Family Communication: Spoke with patient's wife yesterday Disposition Plan: Status is: Inpatient  Dispo: The patient is from: Home              Anticipated d/c is to: [facility] psych unit              Patient currently medically stable to go out to a Geri psych unit once a bed found   Difficult to place patient.  Yes.  Consultants: Psychiatry Orthopedic surgery  Time spent: 26 minutes  [employer]

## 2020-12-04 NOTE — TOC Progression Note (Signed)
Transition of Care [facility]) - Progression Note    Patient Details  Name: James Sanford MRN: [id number] Date of Birth: October 09, 1955  [facility] CM/SW Contact  Caryn Section, RN Phone Number: 12/04/2020, 3:41 PM  Clinical Narrative:   Clinical information refaxed to [facility] for review as per [facility], awaiting response.         Expected Discharge Plan and Services                                                 Social Determinants of Health (SDOH) Interventions    Readmission Risk Interventions No flowsheet data found.

## 2020-12-05 DIAGNOSIS — F1010 Alcohol abuse, uncomplicated: Secondary | ICD-10-CM | POA: Diagnosis not present

## 2020-12-05 DIAGNOSIS — F321 Major depressive disorder, single episode, moderate: Secondary | ICD-10-CM | POA: Diagnosis not present

## 2020-12-05 DIAGNOSIS — S72145A Nondisplaced intertrochanteric fracture of left femur, initial encounter for closed fracture: Secondary | ICD-10-CM | POA: Diagnosis not present

## 2020-12-05 DIAGNOSIS — I480 Paroxysmal atrial fibrillation: Secondary | ICD-10-CM | POA: Diagnosis not present

## 2020-12-05 MED ORDER — POTASSIUM CHLORIDE CRYS ER 20 MEQ PO TBCR
40.0000 meq | EXTENDED_RELEASE_TABLET | Freq: Once | ORAL | Status: AC
  Administered 2020-12-05: 40 meq via ORAL
  Filled 2020-12-05: qty 2

## 2020-12-05 MED ORDER — CHOLESTYRAMINE 4 G PO PACK
4.0000 g | PACK | Freq: Two times a day (BID) | ORAL | Status: DC
  Administered 2020-12-05 – 2020-12-07 (×5): 4 g via ORAL
  Filled 2020-12-05 (×7): qty 1

## 2020-12-05 NOTE — Progress Notes (Signed)
Physical Therapy Treatment Patient Details Name: James Sanford MRN: [id number] DOB: 1955-11-19 Today's Date: 12/05/2020    History of Present Illness Per MD notes, pt is a 65 y.o. male who presented to the ER via EMS for evaluation of a fall with a left intertrochanteric hip fx and is currently s/p IM nail WBAT LLE. PMH includes alcohol abuse, diabetes mellitus, hypertension, sleep apnea, paroxysmal atrial fibrillation on anticoagulation. MD assessment includes severe depression with suicidal ideation, UTI, alcohol abuse, and displaced, impacted intertrochanteric fracture of the left femur.    PT Comments    Pt was long sitting in bed upon arriving. He agrees to session and is cooperative and pleasant throughout. Easily able to stand, ambulate, and perform stairs with supervision. Pt is still unable to tolerate activity without BUE support. Pt is planning to DC to inpatient geri-psych facility at [place]. Recommend continues skilled PT to advance pt to PLOF.    Follow Up Recommendations  Home health PT;Supervision for mobility/OOB     Equipment Recommendations  Rolling walker with 5" wheels       Precautions / Restrictions Precautions Precautions: Fall Restrictions Weight Bearing Restrictions: Yes LLE Weight Bearing: Weight bearing as tolerated    Mobility  Bed Mobility Overal bed mobility: Modified Independent    Transfers Overall transfer level: Needs assistance Equipment used: Rolling walker (2 wheeled) Transfers: Sit to/from Stand Sit to Stand: Supervision   Ambulation/Gait Ambulation/Gait assistance: Supervision Gait Distance (Feet): 400 Feet Assistive device: Rolling walker (2 wheeled) Gait Pattern/deviations: Step-through pattern Gait velocity: decreased   General Gait Details: pt was able to ambulate 400 ft with RW without assistance   Stairs Stairs: Yes Stairs assistance: Supervision Stair Management: One rail Right;Step to pattern Number of Stairs:  4 General stair comments: Pt was able to ascend/descend stairs without physical assistance      Balance Overall balance assessment: Needs assistance Sitting-balance support: No upper extremity supported;Feet supported Sitting balance-Leahy Scale: Normal     Standing balance support: Bilateral upper extremity supported;During functional activity Standing balance-Leahy Scale: Good    Cognition Arousal/Alertness: Awake/alert Behavior During Therapy: WFL for tasks assessed/performed Overall Cognitive Status: Within Functional Limits for tasks assessed      General Comments: Pt was A and O x 4             Pertinent Vitals/Pain Pain Assessment: No/denies pain Pain Score: 0-No pain Pain Location: Left hip Pain Descriptors / Indicators: Aching;Sore Pain Intervention(s): Limited activity within patient's tolerance;Monitored during session;Premedicated before session;Repositioned     PT Goals (current goals can now be found in the care plan section) Acute Rehab PT Goals Patient Stated Goal: I want to get better Progress towards PT goals: Progressing toward goals    Frequency    7X/week      PT Plan Current plan remains appropriate       AM-PAC PT "6 Clicks" Mobility   Outcome Measure  Help needed turning from your back to your side while in a flat bed without using bedrails?: None Help needed moving from lying on your back to sitting on the side of a flat bed without using bedrails?: None Help needed moving to and from a bed to a chair (including a wheelchair)?: A Little Help needed standing up from a chair using your arms (e.g., wheelchair or bedside chair)?: A Little Help needed to walk in hospital room?: A Little Help needed climbing 3-5 steps with a railing? : A Little 6 Click Score: 20  End of Session Equipment Utilized During Treatment: Gait belt Activity Tolerance: Patient tolerated treatment well Patient left: in chair;with chair alarm set;with call  bell/phone within reach Nurse Communication: Mobility status PT Visit Diagnosis: Unsteadiness on feet (R26.81);Other abnormalities of gait and mobility (R26.89);Muscle weakness (generalized) (M62.81);History of falling (Z91.81) Pain - Right/Left: Left Pain - part of body: Hip     Time: 4585-9292 PT Time Calculation (min) (ACUTE ONLY): 15 min  Charges:  $Gait Training: 8-22 mins                    Jetta Lout PTA 12/05/20, 1:39 PM

## 2020-12-05 NOTE — Progress Notes (Signed)
Patient ID: Dray Dente, male   DOB: 12/21/1955, 65 y.o.   MRN: [id number] [facility] PROGRESS NOTE  Hawke Villalpando Stormes YIR:[id number] DOB: [birth year] DOA: 11/23/2020 PCP: Jerl Mina, MD  HPI/Subjective: Patient complains of some diarrhea.  This has been going on and off for a while.  Admitted initially with hip fracture.  Awaiting Geri psych bed.  Objective: Vitals:   12/05/20 0740 12/05/20 1549  BP: 106/75 97/61  Pulse: 63 71  Resp: 15 17  Temp: 98.9 F (37.2 C) 98.5 F (36.9 C)  SpO2: 99% 99%    Intake/Output Summary (Last 24 hours) at 12/05/2020 1718 Last data filed at 12/05/2020 1300 Gross per 24 hour  Intake 600 ml  Output 200 ml  Net 400 ml   Filed Weights   11/23/20 1438  Weight: 77.1 kg    ROS: Review of Systems  Respiratory:  Negative for shortness of breath.   Cardiovascular:  Negative for chest pain.  Gastrointestinal:  Positive for diarrhea. Negative for abdominal pain, nausea and vomiting.  Exam: Physical Exam HENT:     Head: Normocephalic.     Mouth/Throat:     Pharynx: No oropharyngeal exudate.  Eyes:     General: Lids are normal.     Conjunctiva/sclera: Conjunctivae normal.     Pupils: Pupils are equal, round, and reactive to light.  Cardiovascular:     Rate and Rhythm: Normal rate and regular rhythm.     Heart sounds: Normal heart sounds, S1 normal and S2 normal.  Pulmonary:     Breath sounds: Normal breath sounds. No decreased breath sounds, wheezing, rhonchi or rales.  Abdominal:     Palpations: Abdomen is soft.     Tenderness: There is no abdominal tenderness.  Musculoskeletal:     Right lower leg: No swelling.     Left lower leg: No swelling.  Skin:    General: Skin is warm.     Findings: No rash.  Neurological:     Mental Status: He is alert.     Comments: Answers questions appropriately      Scheduled Meds:  apixaban  5 mg Oral BID   cholestyramine  4 g Oral BID   ferrous sulfate  325 mg Oral Q breakfast    FLUoxetine  20 mg Oral Daily   folic acid  1 mg Oral Daily   metoprolol succinate  25 mg Oral Daily   multivitamin with minerals  1 tablet Oral Daily   thiamine  100 mg Oral Daily   Assessment/Plan:  Major depression with initial suicidal ideation.  Awaiting Geri psych bed.  Patient on fluoxetine. Left hip intertrochanteric fracture status post left intramedullary nail fixation.  This was done on 11/25/2020 by Dr. Odis Luster.  Walking well with physical therapy. Paroxysmal atrial fibrillation on metoprolol for rate control and Eliquis for anticoagulation History of alcohol abuse.  Continue thiamine multivitamin and folic acid Hypokalemia will replace orally Chronic intermittent diarrhea we will start cholestyramine and see if this helps. Essential hypertension on metoprolol Impaired fasting glucose        Code Status:     Code Status Orders  (From admission, onward)           Start     Ordered   11/24/20 1406  Full code  Continuous        11/24/20 1407           Code Status History     Date Active Date Inactive Code Status Order ID  Comments User Context   11/23/2020 2136 11/24/2020 1407 Full Code [id number]  Merwyn Katos, MD ED   01/25/2020 2118 02/06/2020 2208 Full Code [id number]  Rometta Emery, MD ED   10/29/2017 1711 10/31/2017 1920 Full Code [id number]  Enid Baas, MD Inpatient      Family Communication: Spoke with wife on phone Disposition Plan: Status is: Inpatient  Dispo: The patient is from: Home              Anticipated d/c is to: [facility] psych bed              Patient currently medically stable once [facility] psych bed obtained.  Having difficulty obtaining a bed at this point.   Difficult to place patient.  No.  Time spent: 26 minutes  [employer]

## 2020-12-05 NOTE — TOC Progression Note (Signed)
Transition of Care [facility]) - Progression Note    Patient Details  Name: James Sanford MRN: [id number] Date of Birth: 1955-10-18  [facility] CM/SW Contact  Caryn Section, RN Phone Number: 12/05/2020, 4:28 PM  Clinical Narrative:   Still awaiting psychiatric bed, no acceptances yet.  TOC to continue to follow.         Expected Discharge Plan and Services                                                 Social Determinants of Health (SDOH) Interventions    Readmission Risk Interventions No flowsheet data found.

## 2020-12-06 DIAGNOSIS — R197 Diarrhea, unspecified: Secondary | ICD-10-CM | POA: Diagnosis not present

## 2020-12-06 DIAGNOSIS — S72002S Fracture of unspecified part of neck of left femur, sequela: Secondary | ICD-10-CM

## 2020-12-06 DIAGNOSIS — I480 Paroxysmal atrial fibrillation: Secondary | ICD-10-CM | POA: Diagnosis not present

## 2020-12-06 DIAGNOSIS — E876 Hypokalemia: Secondary | ICD-10-CM

## 2020-12-06 DIAGNOSIS — F321 Major depressive disorder, single episode, moderate: Secondary | ICD-10-CM | POA: Diagnosis not present

## 2020-12-06 LAB — BASIC METABOLIC PANEL
Anion gap: 4 — ABNORMAL LOW (ref 5–15)
BUN: 19 mg/dL (ref 8–23)
CO2: 24 mmol/L (ref 22–32)
Calcium: 8 mg/dL — ABNORMAL LOW (ref 8.9–10.3)
Chloride: 113 mmol/L — ABNORMAL HIGH (ref 98–111)
Creatinine, Ser: 0.54 mg/dL — ABNORMAL LOW (ref 0.61–1.24)
GFR, Estimated: >60 mL/min (ref >60)
Glucose, Bld: 93 mg/dL (ref 70–99)
Potassium: 3.2 mmol/L — ABNORMAL LOW (ref 3.5–5.1)
Sodium: 141 mmol/L (ref 135–145)

## 2020-12-06 LAB — GASTROINTESTINAL PANEL BY PCR, STOOL (REPLACES STOOL CULTURE)

## 2020-12-06 LAB — MAGNESIUM: Magnesium: 1.8 mg/dL (ref 1.7–2.4)

## 2020-12-06 MED ORDER — POTASSIUM CHLORIDE CRYS ER 20 MEQ PO TBCR
40.0000 meq | EXTENDED_RELEASE_TABLET | Freq: Every day | ORAL | Status: DC
  Administered 2020-12-06 – 2020-12-27 (×22): 40 meq via ORAL
  Filled 2020-12-06 (×23): qty 2

## 2020-12-06 NOTE — Progress Notes (Signed)
Physical Therapy Treatment Patient Details Name: James Sanford MRN: [id number] DOB: 08-29-1955 Today's Date: 12/06/2020    History of Present Illness Per MD notes, pt is a 65 y.o. male who presented to the ER via EMS for evaluation of a fall with a left intertrochanteric hip fx and is currently s/p IM nail WBAT LLE. PMH includes alcohol abuse, diabetes mellitus, hypertension, sleep apnea, paroxysmal atrial fibrillation on anticoagulation. MD assessment includes severe depression with suicidal ideation, UTI, alcohol abuse, and displaced, impacted intertrochanteric fracture of the left femur.    PT Comments    Pt was supine in bed upon arriving. He is A and O x 4 and agrees to session without no encouragement required. Pt has new isolation precautions since last seen. Author followed enteric policies by applying new gown, mask, socks and under garment. He was easily able to  exit bed, stand and ambulate with AD. Still does not demonstrate safe enough abilities to wean from AD. Pt is progressing well. Recommend continued skilled PT to address deficits while assisting pt to full independence with ADLs.    Follow Up Recommendations  Home health PT;Other (comment) (planning to go geri[place]psch unit [place])     Equipment Recommendations  Rolling walker with 5" wheels       Precautions / Restrictions Precautions Precautions: Fall Restrictions Weight Bearing Restrictions: Yes LLE Weight Bearing: Weight bearing as tolerated    Mobility  Bed Mobility Overal bed mobility: Modified Independent   Transfers Overall transfer level: Needs assistance Equipment used: Rolling walker (2 wheeled) Transfers: Sit to/from Stand Sit to Stand: Supervision   Ambulation/Gait Ambulation/Gait assistance: Supervision Gait Distance (Feet): 400 Feet Assistive device: Rolling walker (2 wheeled) Gait Pattern/deviations: Step-through pattern     General Gait Details: pt was able to ambulate 400 ft with RW  without assistance    Balance Overall balance assessment: Needs assistance Sitting-balance support: No upper extremity supported;Feet supported Sitting balance-Leahy Scale: Normal     Standing balance support: Bilateral upper extremity supported;During functional activity Standing balance-Leahy Scale: Good Standing balance comment: pt has good balance with UE support on RW however extremely poor without UE support      Cognition Arousal/Alertness: Awake/alert Behavior During Therapy: WFL for tasks assessed/performed Overall Cognitive Status: Within Functional Limits for tasks assessed      General Comments: Pt is A and O x 4 and extremely pleasant throughout      Exercises Total Joint Exercises Ankle Circles/Pumps: AROM;Strengthening;Both;10 reps;Supine Quad Sets: AROM;Strengthening;Both;10 reps;Supine Gluteal Sets: AROM;Strengthening;Both;10 reps;Supine Heel Slides: AROM;Strengthening;Left;10 reps;Supine Hip ABduction/ADduction: AROM;Strengthening;Left;10 reps;Supine Straight Leg Raises: AROM;10 reps;Strengthening;Supine;AAROM        Pertinent Vitals/Pain Pain Assessment: No/denies pain Pain Score: 0-No pain Pain Location: Left hip Pain Descriptors / Indicators: Aching;Sore Pain Intervention(s): Limited activity within patient's tolerance;Monitored during session;Premedicated before session;Repositioned     PT Goals (current goals can now be found in the care plan section) Acute Rehab PT Goals Patient Stated Goal: I want to get help at geri-psych facility ( hx of alcoholism) Progress towards PT goals: Progressing toward goals    Frequency    7X/week      PT Plan Current plan remains appropriate       AM-PAC PT "6 Clicks" Mobility   Outcome Measure  Help needed turning from your back to your side while in a flat bed without using bedrails?: None Help needed moving from lying on your back to sitting on the side of a flat bed without using bedrails?:  None  Help needed moving to and from a bed to a chair (including a wheelchair)?: A Little Help needed standing up from a chair using your arms (e.g., wheelchair or bedside chair)?: A Little Help needed to walk in hospital room?: A Little Help needed climbing 3-5 steps with a railing? : A Little 6 Click Score: 20    End of Session Equipment Utilized During Treatment: Gait belt Activity Tolerance: Patient tolerated treatment well Patient left: in chair;with chair alarm set;with call bell/phone within reach Nurse Communication: Mobility status PT Visit Diagnosis: Unsteadiness on feet (R26.81);Other abnormalities of gait and mobility (R26.89);Muscle weakness (generalized) (M62.81);History of falling (Z91.81) Pain - Right/Left: Left Pain - part of body: Hip     Time: 1125-1140 PT Time Calculation (min) (ACUTE ONLY): 15 min  Charges:  $Gait Training: 8-22 mins                    Jetta Lout PTA 12/06/20, 1:19 PM

## 2020-12-06 NOTE — Progress Notes (Addendum)
Patient ID: Adeoluwa Silvers, male   DOB: 1955/06/12, 65 y.o.   MRN: [id number] [facility] PROGRESS NOTE  Safal Halderman Dougher YQM:[id number] DOB: 03-01-1956 DOA: 11/23/2020 PCP: Jerl Mina, MD  HPI/Subjective: Patient does have some diarrhea.  No other complaints.  Feels okay.  Initially admitted with fall and hip fracture.  Objective: Vitals:   12/06/20 0435 12/06/20 0738  BP: 109/78 103/78  Pulse: (!) 58 60  Resp: 18 17  Temp: 98.1 F (36.7 C) 98.7 F (37.1 C)  SpO2: 100% 99%     Filed Weights   11/23/20 1438  Weight: 77.1 kg    ROS: Review of Systems  Respiratory:  Negative for shortness of breath.   Cardiovascular:  Negative for chest pain.  Gastrointestinal:  Positive for diarrhea. Negative for abdominal pain, nausea and vomiting.  Exam: Physical Exam HENT:     Head: Normocephalic.     Mouth/Throat:     Pharynx: No oropharyngeal exudate.  Eyes:     General: Lids are normal.     Conjunctiva/sclera: Conjunctivae normal.  Cardiovascular:     Rate and Rhythm: Normal rate and regular rhythm.     Heart sounds: Normal heart sounds, S1 normal and S2 normal.  Pulmonary:     Breath sounds: No decreased breath sounds, wheezing, rhonchi or rales.  Abdominal:     Palpations: Abdomen is soft.     Tenderness: no abdominal tenderness  Musculoskeletal:     Right lower leg: No swelling.     Left lower leg: No swelling.  Skin:    General: Skin is warm.     Findings: No rash.  Neurological:     Mental Status: He is alert.     Comments: Answers questions appropriately      Scheduled Meds:  apixaban  5 mg Oral BID   cholestyramine  4 g Oral BID   ferrous sulfate  325 mg Oral Q breakfast   FLUoxetine  20 mg Oral Daily   folic acid  1 mg Oral Daily   metoprolol succinate  25 mg Oral Daily   multivitamin with minerals  1 tablet Oral Daily   potassium chloride  40 mEq Oral Daily   thiamine  100 mg Oral Daily   Assessment/Plan:  Major depression  with initial suicide ideation.  Continue fluoxetine.  Awaiting Geri psych bed. Diarrhea.  I did order comprehensive stool panel and started cholestyramine. Left intertrochanteric fracture status post left intramedullary nail fixation done on 11/25/2020 by Dr. Odis Luster.  Continue walking with physical therapy Paroxysmal atrial fibrillation on metoprolol for rate control and Eliquis for anticoagulation History of alcohol abuse on thiamine, multivitamin and folic acid Hypokalemia replace daily.  Potassium 3.2 today.  Check phosphorus and magnesium tomorrow    Code Status:     Code Status Orders  (From admission, onward)           Start     Ordered   11/24/20 1406  Full code  Continuous        11/24/20 1407           Code Status History     Date Active Date Inactive Code Status Order ID Comments User Context   11/23/2020 2136 11/24/2020 1407 Full Code [id number]  Merwyn Katos, MD ED   01/25/2020 2118 02/06/2020 2208 Full Code [id number]  Rometta Emery, MD ED   10/29/2017 1711 10/31/2017 1920 Full Code [id number]  Enid Baas, MD Inpatient      Family Communication: Spoke  with wife yesterday Disposition Plan: Status is: Inpatient  Dispo: The patient is from: Home              Anticipated d/c is to: [facility] bed              Patient currently medically stable to go to [facility] psych bed whenever a bed available   Difficult to place patient.  No.  Time spent: 24 minutes  [employer]

## 2020-12-07 DIAGNOSIS — R197 Diarrhea, unspecified: Secondary | ICD-10-CM | POA: Diagnosis not present

## 2020-12-07 DIAGNOSIS — S72145A Nondisplaced intertrochanteric fracture of left femur, initial encounter for closed fracture: Secondary | ICD-10-CM | POA: Diagnosis not present

## 2020-12-07 DIAGNOSIS — F321 Major depressive disorder, single episode, moderate: Secondary | ICD-10-CM | POA: Diagnosis not present

## 2020-12-07 DIAGNOSIS — I480 Paroxysmal atrial fibrillation: Secondary | ICD-10-CM | POA: Diagnosis not present

## 2020-12-07 LAB — POTASSIUM: Potassium: 3.6 mmol/L (ref 3.5–5.1)

## 2020-12-07 LAB — MAGNESIUM: Magnesium: 1.9 mg/dL (ref 1.7–2.4)

## 2020-12-07 LAB — PHOSPHORUS: Phosphorus: 3.7 mg/dL (ref 2.5–4.6)

## 2020-12-07 MED ORDER — CHOLESTYRAMINE 4 G PO PACK
4.0000 g | PACK | Freq: Three times a day (TID) | ORAL | Status: DC
  Administered 2020-12-07 – 2020-12-11 (×12): 4 g via ORAL
  Filled 2020-12-07 (×14): qty 1

## 2020-12-07 NOTE — Progress Notes (Signed)
Physical Therapy Treatment Patient Details Name: James Sanford MRN: [id number] DOB: March 20, 1956 Today's Date: 12/07/2020    History of Present Illness Per MD notes, pt is a 65 y.o. male who presented to the ER via EMS for evaluation of a fall with a left intertrochanteric hip fx and is currently s/p IM nail WBAT LLE. PMH includes alcohol abuse, diabetes mellitus, hypertension, sleep apnea, paroxysmal atrial fibrillation on anticoagulation. MD assessment includes severe depression with suicidal ideation, UTI, alcohol abuse, and displaced, impacted intertrochanteric fracture of the left femur.    PT Comments    Pt received seated in recliner agreeable to PT services. Pt remains supervision for all STS transfers and amb with use of RW. Continuing to display ability to amb 400' with RW at adequate speed throughout entirety of gait without demo'ing fatigue via slowing of gait, rest stops, or SOB. Pt still requiring AAROM d/t weakness for L hip SLR and hip abduction in seated/reclined position. All needs within reach with RN in room. D/c recs continue to remain appropriate.     Follow Up Recommendations  Home health PT;Other (comment)     Equipment Recommendations  Rolling walker with 5" wheels    Recommendations for Other Services       Precautions / Restrictions Precautions Precautions: Fall Restrictions Weight Bearing Restrictions: Yes LLE Weight Bearing: Weight bearing as tolerated    Mobility  Bed Mobility Overal bed mobility: Modified Independent             General bed mobility comments: in recliner/pre-post session    Transfers Overall transfer level: Needs assistance Equipment used: Rolling walker (2 wheeled) Transfers: Sit to/from Stand Sit to Stand: Supervision            Ambulation/Gait Ambulation/Gait assistance: Supervision Gait Distance (Feet): 400 Feet Assistive device: Rolling walker (2 wheeled) Gait Pattern/deviations: Step-through pattern          Stairs             Wheelchair Mobility    Modified Rankin (Stroke Patients Only)       Balance Overall balance assessment: Needs assistance Sitting-balance support: No upper extremity supported;Feet supported Sitting balance-Leahy Scale: Normal     Standing balance support: Bilateral upper extremity supported;During functional activity Standing balance-Leahy Scale: Good                              Cognition Arousal/Alertness: Awake/alert Behavior During Therapy: WFL for tasks assessed/performed Overall Cognitive Status: Within Functional Limits for tasks assessed                                 General Comments: Pleasant throughout session.      Exercises Total Joint Exercises Ankle Circles/Pumps: AROM;Both;10 reps;Seated Hip ABduction/ADduction: AROM;Strengthening;Left;10 reps;Supine Straight Leg Raises: AROM;10 reps;Strengthening;Supine;AAROM;Left;Right Long Arc Quad: AROM;Strengthening;Both;10 reps;Seated General Exercises - Lower Extremity Heel Raises: AROM;20 reps;Standing;Both;Strengthening    General Comments        Pertinent Vitals/Pain Pain Assessment: No/denies pain Pain Score: 0-No pain Pain Location: Left hip Pain Descriptors / Indicators: Aching;Sore    Home Living                      Prior Function            PT Goals (current goals can now be found in the care plan section) Acute Rehab PT Goals Patient Stated  Goal: I want to get help at geri-psych facility ( hx of alcoholism) PT Goal Formulation: With patient Time For Goal Achievement: 12/09/20 Potential to Achieve Goals: Good Progress towards PT goals: Progressing toward goals    Frequency    7X/week      PT Plan Current plan remains appropriate    Co-evaluation              AM-PAC PT "6 Clicks" Mobility   Outcome Measure  Help needed turning from your back to your side while in a flat bed without using bedrails?:  None Help needed moving from lying on your back to sitting on the side of a flat bed without using bedrails?: None Help needed moving to and from a bed to a chair (including a wheelchair)?: A Little Help needed standing up from a chair using your arms (e.g., wheelchair or bedside chair)?: A Little Help needed to walk in hospital room?: A Little Help needed climbing 3-5 steps with a railing? : A Little 6 Click Score: 20    End of Session Equipment Utilized During Treatment: Gait belt Activity Tolerance: Patient tolerated treatment well Patient left: in chair;with chair alarm set;with call bell/phone within reach Nurse Communication: Mobility status PT Visit Diagnosis: Unsteadiness on feet (R26.81);Other abnormalities of gait and mobility (R26.89);Muscle weakness (generalized) (M62.81);History of falling (Z91.81) Pain - Right/Left: Left Pain - part of body: Hip     Time: 8250-5397 PT Time Calculation (min) (ACUTE ONLY): 12 min  Charges:  $Therapeutic Exercise: 8-22 mins           Delphia Grates. Fairly IV, PT, DPT Physical Therapist- [place]  [facility]  12/07/2020, 11:52 AM

## 2020-12-07 NOTE — Plan of Care (Signed)
  Problem: Education: Goal: Verbalization of understanding the information provided (i.e., activity precautions, restrictions, etc) will improve Outcome: Progressing   Problem: Activity: Goal: Ability to ambulate and perform ADLs will improve Outcome: Progressing   Problem: Clinical Measurements: Goal: Postoperative complications will be avoided or minimized Outcome: Progressing   Problem: Self-Concept: Goal: Ability to maintain and perform role responsibilities to the fullest extent possible will improve Outcome: Progressing   Problem: Education: Goal: Knowledge of General Education information will improve Description: Including pain rating scale, medication(s)/side effects and non-pharmacologic comfort measures Outcome: Progressing   Problem: Health Behavior/Discharge Planning: Goal: Ability to manage health-related needs will improve Outcome: Progressing   Problem: Clinical Measurements: Goal: Diagnostic test results will improve Outcome: Progressing Goal: Respiratory complications will improve Outcome: Progressing Goal: Cardiovascular complication will be avoided Outcome: Progressing   Problem: Activity: Goal: Risk for activity intolerance will decrease Outcome: Progressing   Problem: Nutrition: Goal: Adequate nutrition will be maintained Outcome: Progressing   Problem: Coping: Goal: Level of anxiety will decrease Outcome: Progressing   Problem: Elimination: Goal: Will not experience complications related to urinary retention Outcome: Progressing   Problem: Pain Managment: Goal: General experience of comfort will improve Outcome: Progressing   Problem: Safety: Goal: Ability to remain free from injury will improve Outcome: Progressing   Problem: Skin Integrity: Goal: Risk for impaired skin integrity will decrease Outcome: Progressing   Problem: Education: Goal: Knowledge of General Education information will improve Description: Including pain rating  scale, medication(s)/side effects and non-pharmacologic comfort measures Outcome: Progressing   Problem: Health Behavior/Discharge Planning: Goal: Ability to manage health-related needs will improve Outcome: Progressing   Problem: Clinical Measurements: Goal: Ability to maintain clinical measurements within normal limits will improve Outcome: Progressing Goal: Will remain free from infection Outcome: Progressing Goal: Diagnostic test results will improve Outcome: Progressing Goal: Respiratory complications will improve Outcome: Progressing Goal: Cardiovascular complication will be avoided Outcome: Progressing   Problem: Activity: Goal: Risk for activity intolerance will decrease Outcome: Progressing   Problem: Nutrition: Goal: Adequate nutrition will be maintained Outcome: Progressing   Problem: Coping: Goal: Level of anxiety will decrease Outcome: Progressing   Problem: Elimination: Goal: Will not experience complications related to bowel motility Outcome: Progressing Goal: Will not experience complications related to urinary retention Outcome: Progressing   Problem: Pain Managment: Goal: General experience of comfort will improve Outcome: Progressing   Problem: Safety: Goal: Ability to remain free from injury will improve Outcome: Progressing   Problem: Skin Integrity: Goal: Risk for impaired skin integrity will decrease Outcome: Progressing

## 2020-12-07 NOTE — Progress Notes (Signed)
Patient ID: James Sanford, male   DOB: 1956-04-02, 65 y.o.   MRN: [id number] [facility] PROGRESS NOTE  James Sanford [id number]:[id number] DOB: Jan 13, 1956 DOA: 11/23/2020 PCP: Jerl Mina, MD  HPI/Subjective: Patient feels okay.  Still has some diarrhea.  No pain in the leg.  Initially admitted with fall and found to have a hip fracture.  Objective: Vitals:   12/07/20 1133 12/07/20 1134  BP: 111/87   Pulse: (!) 114 71  Resp: 16   Temp: 98.4 F (36.9 C)   SpO2: 100% 99%    Intake/Output Summary (Last 24 hours) at 12/07/2020 1326 Last data filed at 12/07/2020 0857 Gross per 24 hour  Intake --  Output 850 ml  Net -850 ml   Filed Weights   11/23/20 1438  Weight: 77.1 kg    ROS: Review of Systems  Respiratory:  Negative for shortness of breath.   Cardiovascular:  Negative for chest pain.  Gastrointestinal:  Positive for diarrhea. Negative for abdominal pain, nausea and vomiting.  Exam: Physical Exam HENT:     Head: Normocephalic.     Mouth/Throat:     Pharynx: No oropharyngeal exudate.  Eyes:     General: Lids are normal.     Conjunctiva/sclera: Conjunctivae normal.  Cardiovascular:     Rate and Rhythm: Normal rate and regular rhythm.     Heart sounds: Normal heart sounds, S1 normal and S2 normal.  Pulmonary:     Breath sounds: Normal breath sounds. No decreased breath sounds, wheezing, rhonchi or rales.  Abdominal:     Palpations: Abdomen is soft.     Tenderness: There is no abdominal tenderness.  Musculoskeletal:     Right lower leg: No swelling.     Left lower leg: No swelling.  Skin:    General: Skin is warm.     Findings: No rash.  Neurological:     Mental Status: He is alert.     Comments: Answers questions appropriately      Scheduled Meds:  apixaban  5 mg Oral BID   cholestyramine  4 g Oral BID   ferrous sulfate  325 mg Oral Q breakfast   FLUoxetine  20 mg Oral Daily   folic acid  1 mg Oral Daily   metoprolol succinate  25 mg  Oral Daily   multivitamin with minerals  1 tablet Oral Daily   potassium chloride  40 mEq Oral Daily   thiamine  100 mg Oral Daily     Assessment/Plan:  Major depression with initial suicidal ideation.  On fluoxetine.  [facility] psych bed when available. Diarrhea.  Stool comprehensive panel negative.  Increase cholestyramine to 3 times a day dosing.  Patient on iron. Left intertrochanteric fracture status post left intramedullary nail fixation done on 11/25/2020 by Dr. Odis Luster.  Walking well with physical therapy Paroxysmal atrial fibrillation on metoprolol for rate control and Eliquis for anticoagulation Alcohol abuse on thiamine multivitamin and folic acid Hypokalemia daily potassium supplementation        Code Status:     Code Status Orders  (From admission, onward)           Start     Ordered   11/24/20 1406  Full code  Continuous        11/24/20 1407           Code Status History     Date Active Date Inactive Code Status Order ID Comments User Context   11/23/2020 2136 11/24/2020 1407 Full Code [id number]  Merwyn Katos, MD ED   01/25/2020 2118 02/06/2020 2208 Full Code [id number]  Rometta Emery, MD ED   10/29/2017 1711 10/31/2017 1920 Full Code [id number]  Enid Baas, MD Inpatient      Family Communication: Spoke with wife on the phone Disposition Plan: Status is: Inpatient  Dispo: The patient is from: Home              Anticipated d/c is to: [facility] psych when bed available              Patient currently medically stable   Difficult to place patient. Yes.  Time spent: 25 minutes  James Sanford

## 2020-12-07 NOTE — Progress Notes (Signed)
PT Cancellation Note  Patient Details Name: James Sanford MRN: [id number] DOB: 23-Nov-1955   Cancelled Treatment:    Reason Eval/Treat Not Completed: Patient declined, no reason specified. Pt pleasantly denying PT at this time. Requesting PT to attempt later after he is "cleaned up". PT will re-attempt as time allows.   Delphia Grates. Fairly IV, PT, DPT Physical Therapist-   [facility]  12/07/2020, 10:49 AM

## 2020-12-07 NOTE — Progress Notes (Signed)
Subjective:  POD #12 s/p intramedullary fixation for left intertrochanteric hip fracture..   Patient reports left hip pain as mild.  Patient is up out of bed to a chair today.  Patient states he is getting around well with physical therapy.  Objective:   VITALS:   Vitals:   12/07/20 0835 12/07/20 0841 12/07/20 1133 12/07/20 1134  BP: 121/90 107/75 111/87   Pulse: 68 93 (!) 114 71  Resp: 14 14 16    Temp: 98.8 F (37.1 C) 98.6 F (37 C) 98.4 F (36.9 C)   TempSrc:      SpO2: 99% 100% 100% 99%  Weight:      Height:        PHYSICAL EXAM: Left lower extremity Neurovascular intact Sensation intact distally Intact pulses distally Dorsiflexion/Plantar flexion intact Mild amount of sanguinous drainage on the Aquacel dressing.  No evidence for active bleeding. No cellulitis present Left thigh compartment soft  LABS  Results for orders placed or performed during the hospital encounter of 11/23/20 (from the past 24 hour(s))  Gastrointestinal Panel by PCR , Stool     Status: None   Collection Time: 12/06/20  5:50 PM   Specimen: Stool  Result Value Ref Range   Campylobacter species NOT DETECTED NOT DETECTED   Plesimonas shigelloides NOT DETECTED NOT DETECTED   Salmonella species NOT DETECTED NOT DETECTED   Yersinia enterocolitica NOT DETECTED NOT DETECTED   Vibrio species NOT DETECTED NOT DETECTED   Vibrio cholerae NOT DETECTED NOT DETECTED   Enteroaggregative E coli (EAEC) NOT DETECTED NOT DETECTED   Enteropathogenic E coli (EPEC) NOT DETECTED NOT DETECTED   Enterotoxigenic E coli (ETEC) NOT DETECTED NOT DETECTED   Shiga like toxin producing E coli (STEC) NOT DETECTED NOT DETECTED   Shigella/Enteroinvasive E coli (EIEC) NOT DETECTED NOT DETECTED   Cryptosporidium NOT DETECTED NOT DETECTED   Cyclospora cayetanensis NOT DETECTED NOT DETECTED   Entamoeba histolytica NOT DETECTED NOT DETECTED   Giardia lamblia NOT DETECTED NOT DETECTED   Adenovirus F40/41 NOT DETECTED NOT  DETECTED   Astrovirus NOT DETECTED NOT DETECTED   Norovirus GI/GII NOT DETECTED NOT DETECTED   Rotavirus A NOT DETECTED NOT DETECTED   Sapovirus (I, II, IV, and V) NOT DETECTED NOT DETECTED  Potassium     Status: None   Collection Time: 12/07/20  4:36 AM  Result Value Ref Range   Potassium 3.6 3.5 - 5.1 mmol/L  Phosphorus     Status: None   Collection Time: 12/07/20  4:36 AM  Result Value Ref Range   Phosphorus 3.7 2.5 - 4.6 mg/dL  Magnesium     Status: None   Collection Time: 12/07/20  4:36 AM  Result Value Ref Range   Magnesium 1.9 1.7 - 2.4 mg/dL    No results found.  Assessment/Plan: 12 Days Post-Op   Principal Problem:   Moderate major depression, single episode (HCC) Active Problems:   Alcohol abuse   Diabetes mellitus type 2, uncomplicated (HCC)   AF (paroxysmal atrial fibrillation) (HCC)   Essential hypertension   Closed nondisplaced intertrochanteric fracture of left femur (HCC)   Acute lower UTI   Protein-calorie malnutrition, severe   Malnutrition of moderate degree   Diarrhea   Impaired fasting glucose   Hypokalemia  Patient doing well postop from an orthopedic standpoint.  Continue physical therapy.  Patient is waiting for a geriatric-psych bed approval.  Patient can follow-up with Dr. 02/06/21 in the office 1 to 2 weeks following discharge.  Patient's staples may  be removed after 2 weeks postop.    Juanell Fairly , MD 12/07/2020, 2:21 PM

## 2020-12-08 DIAGNOSIS — S72145S Nondisplaced intertrochanteric fracture of left femur, sequela: Secondary | ICD-10-CM | POA: Diagnosis not present

## 2020-12-08 DIAGNOSIS — R197 Diarrhea, unspecified: Secondary | ICD-10-CM | POA: Diagnosis not present

## 2020-12-08 DIAGNOSIS — F1010 Alcohol abuse, uncomplicated: Secondary | ICD-10-CM | POA: Diagnosis not present

## 2020-12-08 DIAGNOSIS — F321 Major depressive disorder, single episode, moderate: Secondary | ICD-10-CM | POA: Diagnosis not present

## 2020-12-08 NOTE — TOC Progression Note (Signed)
Transition of Care [facility]) - Progression Note    Patient Details  Name: James Sanford MRN: [id number] Date of Birth: 1955-08-16  Transition of Care [facility] CM/SW Contact  Caryn Section, RN Phone Number: 12/08/2020, 12:05 PM  Clinical Narrative:   Patient still awaiting Geri Psych bed.  Will contact [name] tomorrow, as they admitting department is closed today.  TOC to continue to follow.         Expected Discharge Plan and Services                                                 Social Determinants of Health (SDOH) Interventions    Readmission Risk Interventions No flowsheet data found.

## 2020-12-08 NOTE — Progress Notes (Signed)
PT Cancellation Note  Patient Details Name: Kendel Bessey MRN: [id number] DOB: [birth year]   Cancelled Treatment:    Reason Eval/Treat Not Completed: Other (comment). Received in recliner. Pt politely declining session this date. He reports he is moving around the room well and was happy that staff showed him how to adjust recliner. Will re-attempt tomorrow.    Jimmylee Ratterree 12/08/2020, 1:45 PM Elizabeth Palau, PT, DPT [phone]

## 2020-12-08 NOTE — Progress Notes (Signed)
Patient ID: Cara Aguino, male   DOB: [birth year], 65 y.o.   MRN: [id number] [facility] PROGRESS NOTE  Beauden Tremont Kleiner YHC:[id number] DOB: 1955-12-21 DOA: 11/23/2020 PCP: Jerl Mina, MD  HPI/Subjective: Patient had a bowel movement yesterday but has not had one yet today.  Been having diarrhea.  Initially admitted with a fall and found to have a left hip fracture.  Objective: Vitals:   12/08/20 0811 12/08/20 1113  BP: 120/81 110/79  Pulse: 67 (!) 58  Resp: 16 18  Temp: 98.1 F (36.7 C) 97.9 F (36.6 C)  SpO2: 100% 100%    Intake/Output Summary (Last 24 hours) at 12/08/2020 1138 Last data filed at 12/07/2020 1621 Gross per 24 hour  Intake --  Output 330 ml  Net -330 ml    Filed Weights   11/23/20 1438  Weight: 77.1 kg    ROS: Review of Systems  Respiratory:  Negative for shortness of breath.   Cardiovascular:  Negative for chest pain.  Gastrointestinal:  Positive for diarrhea. Negative for abdominal pain, nausea and vomiting.  Exam: Physical Exam HENT:     Head: Normocephalic.     Mouth/Throat:     Pharynx: No oropharyngeal exudate.  Eyes:     General: Lids are normal.     Conjunctiva/sclera: Conjunctivae normal.  Cardiovascular:     Rate and Rhythm: Normal rate and regular rhythm.     Heart sounds: Normal heart sounds, S1 normal and S2 normal.  Pulmonary:     Breath sounds: Normal breath sounds. No decreased breath sounds, wheezing, rhonchi or rales.  Abdominal:     Palpations: Abdomen is soft.     Tenderness: There is no abdominal tenderness.  Musculoskeletal:     Right lower leg: No swelling.     Left lower leg: No swelling.  Skin:    General: Skin is warm.     Findings: No rash.  Neurological:     Mental Status: He is alert.     Comments: Answers questions appropriately.  Able to straight leg raise.      Scheduled Meds:  apixaban  5 mg Oral BID   cholestyramine  4 g Oral TID   ferrous sulfate  325 mg Oral Q breakfast    FLUoxetine  20 mg Oral Daily   folic acid  1 mg Oral Daily   metoprolol succinate  25 mg Oral Daily   multivitamin with minerals  1 tablet Oral Daily   potassium chloride  40 mEq Oral Daily   thiamine  100 mg Oral Daily     Assessment/Plan:  Major depression and initial suicidal ideation.  Continue fluoxetine.  Awaiting Geri psych bed.   Diarrhea.  Continue cholestyramine 3 times a day.  Patient also on iron. Left intertrochanteric hip fracture status post left intramedullary nail fixation done on 8/23 by Dr. Odis Luster.  Patient walking well with physical therapy.  Staples to come out on 12/10/2020 Paroxysmal atrial fibrillation.  Continue metoprolol for rate control.  Eliquis to prevent stroke. Alcohol abuse.  Continue thiamine multivitamin and folic acid Hypokalemia.  Replace potassium daily    Code Status:     Code Status Orders  (From admission, onward)           Start     Ordered   11/24/20 1406  Full code  Continuous        11/24/20 1407           Code Status History     Date Active  Date Inactive Code Status Order ID Comments User Context   11/23/2020 2136 11/24/2020 1407 Full Code [id number]  Merwyn Katos, MD ED   01/25/2020 2118 02/06/2020 2208 Full Code [id number]  Rometta Emery, MD ED   10/29/2017 1711 10/31/2017 1920 Full Code [id number]  Enid Baas, MD Inpatient      Family Communication: Spoke with wife on the phone Disposition Plan: Status is: Inpatient  Dispo: The patient is from: Home              Anticipated d/c is to: [facility] psych when bed available              Patient currently medically stable   Difficult to place patient. Yes.  Time spent: 25 minutes  [employer]

## 2020-12-09 DIAGNOSIS — S72145S Nondisplaced intertrochanteric fracture of left femur, sequela: Secondary | ICD-10-CM | POA: Diagnosis not present

## 2020-12-09 DIAGNOSIS — F321 Major depressive disorder, single episode, moderate: Secondary | ICD-10-CM | POA: Diagnosis not present

## 2020-12-09 DIAGNOSIS — R197 Diarrhea, unspecified: Secondary | ICD-10-CM | POA: Diagnosis not present

## 2020-12-09 DIAGNOSIS — I480 Paroxysmal atrial fibrillation: Secondary | ICD-10-CM | POA: Diagnosis not present

## 2020-12-09 DIAGNOSIS — D509 Iron deficiency anemia, unspecified: Secondary | ICD-10-CM

## 2020-12-09 LAB — CBC
HCT: 31.3 % — ABNORMAL LOW (ref 39.0–52.0)
Hemoglobin: 10.4 g/dL — ABNORMAL LOW (ref 13.0–17.0)
MCH: 33.1 pg (ref 26.0–34.0)
MCHC: 33.2 g/dL (ref 30.0–36.0)
MCV: 99.7 fL (ref 80.0–100.0)
Platelets: 307 10*3/uL (ref 150–400)
RBC: 3.14 MIL/uL — ABNORMAL LOW (ref 4.22–5.81)
RDW: 14.9 % (ref 11.5–15.5)
WBC: 5.2 10*3/uL (ref 4.0–10.5)
nRBC: 0 % (ref 0.0–0.2)

## 2020-12-09 MED ORDER — DIPHENOXYLATE-ATROPINE 2.5-0.025 MG PO TABS: 1.0000 | ORAL_TABLET | Freq: Four times a day (QID) | ORAL | Status: DC | PRN

## 2020-12-09 NOTE — Progress Notes (Signed)
Nutrition Follow-up  DOCUMENTATION CODES:  Non-severe (moderate) malnutrition in context of social or environmental circumstances  INTERVENTION:  Continue current diet as ordered, encourage PO intake Continue MVI, thiamine, and folic acid daily for hx of EtOH abuse Continue Magic cup TID with meals, each supplement provides 290 kcal and 9 grams of protein Request new measured weight If pt remains inpatient, may benefit from having micronutrient labs assessed for hx of poor intake and gastric bypass surgery  NUTRITION DIAGNOSIS:  Moderate Malnutrition (in the context of social/environmental circumstances) related to  (excessive EtOH intake and hx of gastric bypass surgery) as evidenced by mild fat depletion, moderate fat depletion, mild muscle depletion, moderate muscle depletion, severe muscle depletion.  GOAL:  Patient will meet greater than or equal to 90% of their needs  MONITOR:  PO intake, Labs, Weight trends, Skin, Supplement acceptance  REASON FOR ASSESSMENT:  Consult Hip fracture protocol  ASSESSMENT:  65 yo male with a PMH of alcohol abuse, DM, HTN, HLD, hx of gastric bypass, and atrial fibrillation presented to ED with pain after several falls at home while intoxicated. Non-ambulatory at baseline but family reports he attempts to walk when drinking.  In ED, pt initially placed in IVC status due to wife's report of pt making suicidal ideations while intoxicated and showing symptoms of depression. Reported pt has become more violent at home and has a family hx of depression. However, psychiatry discontinued. 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    Imaging in ED did show a displaced, impacted intertrochanteric fracture of the left femur. Taken for surgical repair 8/23.  Per MD, pt medically ready for dc, but case management working on placement for treatment prior to returning home.  Pt resting  in bedside chair at the time of visit. Reports a good appetite over the last week and that he is eating more than he would be at home. Denies any GI distress or difficulty chewing/swallowing at this time. Will continue to monitor.  No new weights taken in >2 weeks, will request no measured weight to assess for loss.     Average Meal Intake: 8/23-8/29: 79% intake x 15 recorded meals (40-100%) 8/30-9/6: 91% intake x 8 recorded meals (50-100%)  Nutritionally Relevant Medications: Scheduled Meds:  cholestyramine  4 g Oral TID   ferrous sulfate  325 mg Oral Q breakfast   folic acid  1 mg Oral Daily   multivitamin with minerals  1 tablet Oral Daily   potassium chloride  40 mEq Oral Daily   thiamine  100 mg Oral Daily   PRN Meds: ondansetron  Labs Reviewed: HgbA1c 5.3% (8/21)  NUTRITION - FOCUSED PHYSICAL EXAM: Flowsheet Row Most Recent Value  Orbital Region Moderate depletion  Upper Arm Region Mild depletion  Thoracic and Lumbar Region No depletion  Buccal Region Mild depletion  Temple Region Moderate depletion  Clavicle Bone Region Severe depletion  Clavicle and Acromion Bone Region Severe depletion  Scapular Bone Region Unable to assess  Dorsal Hand Mild depletion  Patellar Region Mild depletion  Anterior Thigh Region Mild depletion  Posterior Calf Region Mild depletion  Edema (RD Assessment) None  Hair Reviewed  Eyes Reviewed  Mouth Reviewed  Skin Reviewed  Nails Reviewed   Diet Order:   Diet Order             Diet regular Room service appropriate? Yes; Fluid consistency: Thin  Diet effective now  EDUCATION NEEDS:  Education needs have been addressed  Skin:  Skin Assessment: Reviewed RN Assessment (surgical incisions to the left hip/leg, bruising to bilateral arms)  Last BM:  8/29 - type 5  Height:  Ht Readings from Last 1 Encounters:  11/23/20 6' (1.829 m)   Weight:  Wt Readings from Last 1 Encounters:  11/23/20 77.1 kg    Ideal  Body Weight:  80.9 kg  BMI:  Body mass index is 23.06 kg/m.  Estimated Nutritional Needs:  Kcal:  2200-2400 Protein:  110-120 g/d Fluid:  >2.3 L/d   Greig Castilla, RD, LDN Clinical Dietitian Pager on Amion

## 2020-12-09 NOTE — Progress Notes (Deleted)
PT Cancellation Note  Patient Details Name: James Sanford MRN: [id number] DOB: November 02, 1955   Cancelled Treatment:    Reason Eval/Treat Not Completed: Other (comment) Pt received laying in bed and declining PT evaluation or out of bed mobility at this time. Pt continues to be interested in pulmonary rehabilitation. Pt requesting no more attempts for PT evaluation. Pt notified that if he changes his mind, to notify his doctor or nurse.    Verl Blalock 12/09/2020, 9:13 AM

## 2020-12-09 NOTE — Progress Notes (Signed)
Patient ID: James Sanford, male   DOB: April 22, 1955, 65 y.o.   MRN: [id number] [facility] PROGRESS NOTE  James Sanford LOV:[id number] DOB: 09/16/1955 DOA: 11/23/2020 PCP: Jerl Mina, MD  HPI/Subjective: Patient feels okay.  Still having some diarrhea but graded stage V on the last bowel movement.  Cholestyramine seems to be helping a little bit.  Initially came in with fall and found to have a hip fracture.  Patient awaiting a Geri psych bed.  Objective: Vitals:   12/09/20 0411 12/09/20 0824  BP: 118/83 122/87  Pulse: 69 (!) 53  Resp: 17 18  Temp: 98.1 F (36.7 C) 98 F (36.7 C)  SpO2: 100% 98%    Intake/Output Summary (Last 24 hours) at 12/09/2020 1505 Last data filed at 12/09/2020 1413 Gross per 24 hour  Intake 0 ml  Output 450 ml  Net -450 ml   Filed Weights   11/23/20 1438  Weight: 77.1 kg    ROS: Review of Systems  Respiratory:  Negative for shortness of breath.   Cardiovascular:  Negative for chest pain.  Gastrointestinal:  Positive for diarrhea. Negative for abdominal pain, nausea and vomiting.  Exam: Physical Exam HENT:     Head: Normocephalic.     Mouth/Throat:     Pharynx: No oropharyngeal exudate.  Eyes:     General: Lids are normal.     Conjunctiva/sclera: Conjunctivae normal.  Cardiovascular:     Rate and Rhythm: Normal rate and regular rhythm.     Heart sounds: Normal heart sounds, S1 normal and S2 normal.  Pulmonary:     Breath sounds: Normal breath sounds. No decreased breath sounds, wheezing, rhonchi or rales.  Abdominal:     Palpations: Abdomen is soft.     Tenderness: There is no abdominal tenderness.  Musculoskeletal:     Right lower leg: No swelling.     Left lower leg: No swelling.  Skin:    General: Skin is warm.     Findings: No rash.  Neurological:     Mental Status: He is alert.     Comments: Answers questions appropriately.      Scheduled Meds:  apixaban  5 mg Oral BID   cholestyramine  4 g Oral TID    ferrous sulfate  325 mg Oral Q breakfast   FLUoxetine  20 mg Oral Daily   folic acid  1 mg Oral Daily   metoprolol succinate  25 mg Oral Daily   multivitamin with minerals  1 tablet Oral Daily   potassium chloride  40 mEq Oral Daily   thiamine  100 mg Oral Daily   Brief history.  65 year old man with past medical history of hypertension hyperlipidemia atrial fibrillation and alcohol abuse presents to the hospital after a fall and found to have a left hip fracture.  On 11/25/2020 Dr. Odis Luster did a left intramedullary nail intertrochanteric procedure for left hip fracture.  The patient was seen by psychiatry and they recommended a Geri psych bed for moderate major depression.  The patient is also having diarrhea which I started cholestyramine.  Stool comprehensive panel was negative.  Patient been here too long for C. difficile testing.  White blood cell count normal and afebrile.  Assessment/Plan:  Major depression and initial suicidal ideation.  Patient on fluoxetine.  Awaiting a Geri psych bed.  Unfortunately no bed offers currently. Diarrhea.  Continue cholestyramine 3 times a day.  Patient also on iron.  Lomotil ordered as needed.  Previous colonoscopies were poor prep. Left  intertrochanteric hip fracture status post left intramedullary nail fixation on 11/25/2020 by Dr. Odis Luster.  Patient walking well with physical therapy.  [facility] should come out tomorrow. Paroxysmal atrial fibrillation on metoprolol for rate control and Eliquis for anticoagulation to prevent stroke. Alcohol abuse.  Continue thiamine, folic acid and multivitamin Hypokalemia.  Replace potassium on a daily basis. Iron deficiency anemia.  Last hemoglobin 10.4.  B12 normal range.  Ferritin 54.  Continue oral iron.        Code Status:     Code Status Orders  (From admission, onward)           Start     Ordered   11/24/20 1406  Full code  Continuous        11/24/20 1407           Code Status History     Date  Active Date Inactive Code Status Order ID Comments User Context   11/23/2020 2136 11/24/2020 1407 Full Code [id number]  Merwyn Katos, MD ED   01/25/2020 2118 02/06/2020 2208 Full Code [id number]  Rometta Emery, MD ED   10/29/2017 1711 10/31/2017 1920 Full Code [id number]  Enid Baas, MD Inpatient      Family Communication: Spoke with wife on phone Disposition Plan: Status is: Inpatient  Dispo: The patient is from: Home              Anticipated d/c is to: Unknown at this point still looking for [facility] psych bed              Patient currently medically stable to go out to [facility] psych bed when available   Difficult to place patient.  No.  Time spent: 27 minutes  [employer]

## 2020-12-09 NOTE — Progress Notes (Signed)
Physical Therapy Treatment Patient Details Name: James Sanford MRN: [id number] DOB: 1955/11/25 Today's Date: 12/09/2020    History of Present Illness Per MD notes, pt is a 65 y.o. male who presented to the ER via EMS for evaluation of a fall with a left intertrochanteric hip fx and is currently s/p IM nail WBAT LLE. PMH includes alcohol abuse, diabetes mellitus, hypertension, sleep apnea, paroxysmal atrial fibrillation on anticoagulation. MD assessment includes severe depression with suicidal ideation, UTI, alcohol abuse, and displaced, impacted intertrochanteric fracture of the left femur.    PT Comments    Pt making good progress towards his therapy goals. Pt currently ambulating 400 ft in hallway with SPV/SBA with RW. Pt continues to demonstrate some decreased balance and decreased gait speed during gait training. Pt assessed and goals updated to demonstrate progress. Frequency decreased and pt educated on seated exercise program to continue to progress LE strengthening. Pt will continue to benefit from skilled PT services during stay.    Follow Up Recommendations  Home health PT;Other (comment) (Pt awaiting geri-psych placement)     Equipment Recommendations  Rolling walker with 5" wheels    Recommendations for Other Services       Precautions / Restrictions Precautions Precautions: Fall Restrictions Weight Bearing Restrictions: Yes LLE Weight Bearing: Weight bearing as tolerated    Mobility  Bed Mobility Overal bed mobility: Modified Independent Bed Mobility: Rolling;Sidelying to Sit Rolling: Modified independent (Device/Increase time) Sidelying to sit: Modified independent (Device/Increase time)       General bed mobility comments: Usage of bedrails to assist with rolling for personal hygine    Transfers Overall transfer level: Needs assistance Equipment used: Rolling walker (2 wheeled) Transfers: Sit to/from Stand Sit to Stand: Supervision             Ambulation/Gait Ambulation/Gait assistance: Supervision Gait Distance (Feet): 400 Feet Assistive device: Rolling walker (2 wheeled) Gait Pattern/deviations: Step-through pattern;Decreased step length - right;Narrow base of support Gait velocity: decreased   General Gait Details: Pt ambulated wth RW + SPV/SBA. Pt cued for improved posture throughout for improved trunk and hip extension.   Stairs       Wheelchair Mobility    Modified Rankin (Stroke Patients Only)       Balance Overall balance assessment: Needs assistance Sitting-balance support: No upper extremity supported;Feet supported Sitting balance-Leahy Scale: Normal     Standing balance support: Bilateral upper extremity supported;During functional activity Standing balance-Leahy Scale: Good Standing balance comment: Demonstrating good static and dynamic balance during gait training with usage of RW for UE support         Cognition Arousal/Alertness: Awake/alert Behavior During Therapy: WFL for tasks assessed/performed Overall Cognitive Status: Within Functional Limits for tasks assessed        General Comments: Pleasant throughout session.      Exercises General Exercises - Lower Extremity Long Arc Quad: AROM;Both;20 reps;Seated Hip Flexion/Marching: Both;20 reps;Seated;AROM Toe Raises: AROM;Both;20 reps;Seated Heel Raises: AROM;Both;20 reps;Seated Other Exercises Other Exercises: Scapular retractions 2 x 10, Active B shoulder flexion 2 x 10 for improved posture Other Exercises: Pt assisted with personal hygine after bowel movement in bed. Pt able to roll with usage of bed rails and no physical assistance required. Pt unable to complete pericare without total assistance. Donned brief once standing at EOB with RW support prior to ambulation.    General Comments        Pertinent Vitals/Pain Pain Assessment: No/denies pain    Home Living      Prior  Function            PT Goals (current goals  can now be found in the care plan section) Acute Rehab PT Goals Patient Stated Goal: I want to get help at geri-psych facility ( hx of alcoholism) PT Goal Formulation: With patient Time For Goal Achievement: 12/23/20 Potential to Achieve Goals: Good Progress towards PT goals: Progressing toward goals    Frequency    Min 2X/week      PT Plan Frequency needs to be updated    Co-evaluation              AM-PAC PT "6 Clicks" Mobility   Outcome Measure  Help needed turning from your back to your side while in a flat bed without using bedrails?: None Help needed moving from lying on your back to sitting on the side of a flat bed without using bedrails?: None Help needed moving to and from a bed to a chair (including a wheelchair)?: None Help needed standing up from a chair using your arms (e.g., wheelchair or bedside chair)?: A Little Help needed to walk in hospital room?: A Little Help needed climbing 3-5 steps with a railing? : A Little 6 Click Score: 21    End of Session Equipment Utilized During Treatment: Gait belt Activity Tolerance: Patient tolerated treatment well Patient left: in chair;with call bell/phone within reach;with chair alarm set Nurse Communication: Mobility status PT Visit Diagnosis: Unsteadiness on feet (R26.81);Other abnormalities of gait and mobility (R26.89);Muscle weakness (generalized) (M62.81);History of falling (Z91.81)     Time: 6789-3810 PT Time Calculation (min) (ACUTE ONLY): 27 min  Charges:  $Gait Training: 8-22 mins $Therapeutic Exercise: 8-22 mins                     Verl Blalock, SPT   Verl Blalock 12/09/2020, 2:57 PM

## 2020-12-10 DIAGNOSIS — F321 Major depressive disorder, single episode, moderate: Secondary | ICD-10-CM | POA: Diagnosis not present

## 2020-12-10 LAB — POTASSIUM: Potassium: 3.9 mmol/L (ref 3.5–5.1)

## 2020-12-10 LAB — MAGNESIUM: Magnesium: 1.7 mg/dL (ref 1.7–2.4)

## 2020-12-10 MED ORDER — MAGNESIUM SULFATE 2 GM/50ML IV SOLN
2.0000 g | Freq: Once | INTRAVENOUS | Status: AC
  Administered 2020-12-10: 2 g via INTRAVENOUS
  Filled 2020-12-10: qty 50

## 2020-12-10 MED ORDER — PSYLLIUM 95 % PO PACK
1.0000 | PACK | Freq: Every day | ORAL | Status: DC
  Administered 2020-12-10 – 2020-12-16 (×5): 1 via ORAL
  Filled 2020-12-10 (×7): qty 1

## 2020-12-10 MED ORDER — SIMETHICONE 80 MG PO CHEW
80.0000 mg | CHEWABLE_TABLET | Freq: Four times a day (QID) | ORAL | Status: DC
  Administered 2020-12-10 – 2020-12-21 (×45): 80 mg via ORAL
  Filled 2020-12-10 (×51): qty 1

## 2020-12-10 NOTE — Progress Notes (Signed)
  Subjective:  Patient reports pain as mild.    Objective:   VITALS:   Vitals:   12/09/20 1615 12/09/20 1957 12/10/20 0039 12/10/20 0517  BP: 103/80 93/73 (!) 114/91 113/72  Pulse: (!) 52 65 62 (!) 52  Resp: 18  15 16   Temp: 98 F (36.7 C) 98.2 F (36.8 C) 98.1 F (36.7 C) 98.3 F (36.8 C)  TempSrc: Oral     SpO2: 92% 100% 99% 100%  Weight:      Height:        PHYSICAL EXAM:  Neurologically intact ABD soft Neurovascular intact Sensation intact distally Intact pulses distally Dorsiflexion/Plantar flexion intact Incision: no drainage and staples removed No cellulitis present Compartment soft  LABS  Results for orders placed or performed during the hospital encounter of 11/23/20 (from the past 24 hour(s))  Potassium     Status: None   Collection Time: 12/10/20  4:53 AM  Result Value Ref Range   Potassium 3.9 3.5 - 5.1 mmol/L  Magnesium     Status: None   Collection Time: 12/10/20  4:53 AM  Result Value Ref Range   Magnesium 1.7 1.7 - 2.4 mg/dL    No results found.  Assessment/Plan: 15 Days Post-Op   Principal Problem:   Moderate major depression, single episode (HCC) Active Problems:   Alcohol abuse   Diabetes mellitus type 2, uncomplicated (HCC)   AF (paroxysmal atrial fibrillation) (HCC)   Essential hypertension   Closed nondisplaced intertrochanteric fracture of left femur (HCC)   Acute lower UTI   Protein-calorie malnutrition, severe   Malnutrition of moderate degree   Diarrhea   Impaired fasting glucose   Hypokalemia   Iron deficiency anemia   Up with therapy Okay to discharge from ortho standpoint WBAT on LLE Already on home Eliquis Follow up in office for imaging in 3-4 weeks Call office to confirm appointment [phone]   [phone] , PA-C 12/10/2020, 10:55 AM

## 2020-12-10 NOTE — Progress Notes (Signed)
PROGRESS NOTE    James Sanford  IDP:[id number] DOB: January 08, 1956 DOA: 11/23/2020 PCP: Jerl Mina, MD    Brief Narrative:  65 year old man with past medical history of hypertension hyperlipidemia atrial fibrillation and alcohol abuse presents to the hospital after a fall and found to have a left hip fracture.  On 11/25/2020 Dr. Odis Luster did a left intramedullary nail intertrochanteric procedure for left hip fracture.  The patient was seen by psychiatry and they recommended a Geri psych bed for moderate major depression.  The patient is also having diarrhea which I started cholestyramine.  Stool comprehensive panel was negative.  Patient been here too long for C. difficile testing.  White blood cell count normal and afebrile.   Assessment & Plan:   Principal Problem:   Moderate major depression, single episode (HCC) Active Problems:   Alcohol abuse   Diabetes mellitus type 2, uncomplicated (HCC)   AF (paroxysmal atrial fibrillation) (HCC)   Essential hypertension   Closed nondisplaced intertrochanteric fracture of left femur (HCC)   Acute lower UTI   Protein-calorie malnutrition, severe   Malnutrition of moderate degree   Diarrhea   Impaired fasting glucose   Hypokalemia   Iron deficiency anemia  Major depression and initial suicidal ideation.  Patient on fluoxetine.  Awaiting a Geri psych bed.  Unfortunately no bed offers currently. Diarrhea.  Continue cholestyramine 3 times a day.  Add daily psyllium and 4 times daily simethicone. Left intertrochanteric hip fracture status post left intramedullary nail fixation on 11/25/2020 by Dr. Odis Luster.  Patient walking well with physical therapy.  Cleared for discharge from orthopedic standpoint  paroxysmal atrial fibrillation on metoprolol for rate control and Eliquis for anticoagulation to prevent stroke. Alcohol abuse.  Continue thiamine, folic acid and multivitamin Hypokalemia.  Replace potassium on a daily basis. Iron deficiency  anemia.  Stable.  B12 normal range.  Continue oral iron supplementation   DVT prophylaxis: Eliquis Code Status: Full Family Communication: None today Disposition Plan: Status is: Inpatient  Remains inpatient appropriate because:Inpatient level of care appropriate due to severity of illness.  Unsafe discharge plan  Dispo: The patient is from: Home              Anticipated d/c is to:  [facility] psych inpatient facility              Patient currently is medically stable to d/c.   Difficult to place patient Yes  Needs Geri psych facility.  Currently no bed offers.     Level of care: Med-Surg  Consultants:  Orthopedics  Procedures:  IM nail for hip fracture 8/23  Antimicrobials: None   Subjective: Seen and examined.  Sitting comfortably in bed.  No visible distress.  No complaints  Objective: Vitals:   12/09/20 1615 12/09/20 1957 12/10/20 0039 12/10/20 0517  BP: 103/80 93/73 (!) 114/91 113/72  Pulse: (!) 52 65 62 (!) 52  Resp: 18  15 16   Temp: 98 F (36.7 C) 98.2 F (36.8 C) 98.1 F (36.7 C) 98.3 F (36.8 C)  TempSrc: Oral     SpO2: 92% 100% 99% 100%  Weight:      Height:        Intake/Output Summary (Last 24 hours) at 12/10/2020 1127 Last data filed at 12/10/2020 1016 Gross per 24 hour  Intake 240 ml  Output 800 ml  Net -560 ml   Filed Weights   11/23/20 1438  Weight: 77.1 kg    Examination:  General exam: No acute distress Respiratory system: Lungs  clear.  Normal work of breathing.  Room air Cardiovascular system: S1-S2, RRR, no murmurs, no pedal edema Gastrointestinal system: Abdomen is nondistended, soft and nontender. No organomegaly or masses felt. Normal bowel sounds heard. Central nervous system: Alert, oriented x2, no focal deficits Extremities: Symmetric 5 x 5 power. Skin: No rashes, lesions or ulcers Psychiatry: Judgement and insight appear normal. Mood & affect appropriate.     Data Reviewed: I have personally reviewed following labs and  imaging studies  CBC: Recent Labs  Lab 12/04/20 0332 12/09/20 0510  WBC  --  5.2  HGB 9.8* 10.4*  HCT  --  31.3*  MCV  --  99.7  PLT  --  307   Basic Metabolic Panel: Recent Labs  Lab 12/04/20 0332 12/06/20 0410 12/07/20 0436 12/10/20 0453  NA 140 141  --   --   K 3.2* 3.2* 3.6 3.9  CL 109 113*  --   --   CO2 25 24  --   --   GLUCOSE 98 93  --   --   BUN 23 19  --   --   CREATININE 0.63 0.54*  --   --   CALCIUM 7.9* 8.0*  --   --   MG  --  1.8 1.9 1.7  PHOS  --   --  3.7  --    GFR: Estimated Creatinine Clearance: 103.1 mL/min (A) (by C-G formula based on SCr of 0.54 mg/dL (L)). Liver Function Tests: No results for input(s): AST, ALT, ALKPHOS, BILITOT, PROT, ALBUMIN in the last 168 hours. No results for input(s): LIPASE, AMYLASE in the last 168 hours. No results for input(s): AMMONIA in the last 168 hours. Coagulation Profile: No results for input(s): INR, PROTIME in the last 168 hours. Cardiac Enzymes: No results for input(s): CKTOTAL, CKMB, CKMBINDEX, TROPONINI in the last 168 hours. BNP (last 3 results) No results for input(s): PROBNP in the last 8760 hours. HbA1C: No results for input(s): HGBA1C in the last 72 hours. CBG: No results for input(s): GLUCAP in the last 168 hours. Lipid Profile: No results for input(s): CHOL, HDL, LDLCALC, TRIG, CHOLHDL, LDLDIRECT in the last 72 hours. Thyroid Function Tests: No results for input(s): TSH, T4TOTAL, FREET4, T3FREE, THYROIDAB in the last 72 hours. Anemia Panel: No results for input(s): VITAMINB12, FOLATE, FERRITIN, TIBC, IRON, RETICCTPCT in the last 72 hours. Sepsis Labs: No results for input(s): PROCALCITON, LATICACIDVEN in the last 168 hours.  Recent Results (from the past 240 hour(s))  Gastrointestinal Panel by PCR , Stool     Status: None   Collection Time: 12/06/20  5:50 PM   Specimen: Stool  Result Value Ref Range Status   Campylobacter species NOT DETECTED NOT DETECTED Final   Plesimonas shigelloides  NOT DETECTED NOT DETECTED Final   Salmonella species NOT DETECTED NOT DETECTED Final   Yersinia enterocolitica NOT DETECTED NOT DETECTED Final   Vibrio species NOT DETECTED NOT DETECTED Final   Vibrio cholerae NOT DETECTED NOT DETECTED Final   Enteroaggregative E coli (EAEC) NOT DETECTED NOT DETECTED Final   Enteropathogenic E coli (EPEC) NOT DETECTED NOT DETECTED Final   Enterotoxigenic E coli (ETEC) NOT DETECTED NOT DETECTED Final   Shiga like toxin producing E coli (STEC) NOT DETECTED NOT DETECTED Final   Shigella/Enteroinvasive E coli (EIEC) NOT DETECTED NOT DETECTED Final   Cryptosporidium NOT DETECTED NOT DETECTED Final   Cyclospora cayetanensis NOT DETECTED NOT DETECTED Final   Entamoeba histolytica NOT DETECTED NOT DETECTED Final   Giardia  lamblia NOT DETECTED NOT DETECTED Final   Adenovirus F40/41 NOT DETECTED NOT DETECTED Final   Astrovirus NOT DETECTED NOT DETECTED Final   Norovirus GI/GII NOT DETECTED NOT DETECTED Final   Rotavirus A NOT DETECTED NOT DETECTED Final   Sapovirus (I, II, IV, and V) NOT DETECTED NOT DETECTED Final    Comment: Performed at [facility], [address], [place], [place] [address]         Radiology Studies: No results found.      Scheduled Meds:  apixaban  5 mg Oral BID   cholestyramine  4 g Oral TID   ferrous sulfate  325 mg Oral Q breakfast   FLUoxetine  20 mg Oral Daily   folic acid  1 mg Oral Daily   metoprolol succinate  25 mg Oral Daily   multivitamin with minerals  1 tablet Oral Daily   potassium chloride  40 mEq Oral Daily   psyllium  1 packet Oral Daily   simethicone  80 mg Oral QID   thiamine  100 mg Oral Daily   Continuous Infusions:   LOS: 16 days    Time spent: 25 minutes    Tresa Moore, MD [facility] Pager [phone]  If 7PM-7AM, please contact night-coverage  12/10/2020, 11:27 AM

## 2020-12-11 DIAGNOSIS — F321 Major depressive disorder, single episode, moderate: Secondary | ICD-10-CM | POA: Diagnosis not present

## 2020-12-11 MED ORDER — CHOLESTYRAMINE 4 G PO PACK
4.0000 g | PACK | Freq: Two times a day (BID) | ORAL | Status: DC
  Administered 2020-12-11 – 2020-12-12 (×3): 4 g via ORAL
  Filled 2020-12-11 (×4): qty 1

## 2020-12-11 NOTE — Progress Notes (Signed)
Physical Therapy Treatment Patient Details Name: James Sanford MRN: [id number] DOB: 1956/01/08 Today's Date: 12/11/2020    History of Present Illness Per MD notes, pt is a 65 y.o. male who presented to the ER via EMS for evaluation of a fall with a left intertrochanteric hip fx and is currently s/p IM nail WBAT LLE. PMH includes alcohol abuse, diabetes mellitus, hypertension, sleep apnea, paroxysmal atrial fibrillation on anticoagulation. MD assessment includes severe depression with suicidal ideation, UTI, alcohol abuse, and displaced, impacted intertrochanteric fracture of the left femur.    PT Comments    Pt continues to make progress towards therapy goals. Pt reports completing his seated exercises every day to maintain ROM and strength. Pt ambulated with CGA/SBA in hallway + RW for >500 ft and demonstrates antalgic gait but reports only soreness in L hip. Pt will continue to benefit from skilled PT services to continue to progress gait training, maintain strength, and increase functional mobility.    Follow Up Recommendations  Home health PT;Other (comment) Awaiting geri-psych placement     Equipment Recommendations  Rolling walker with 5" wheels    Recommendations for Other Services       Precautions / Restrictions Precautions Precautions: Fall Restrictions Weight Bearing Restrictions: Yes LLE Weight Bearing: Weight bearing as tolerated    Mobility  Bed Mobility Overal bed mobility: Modified Independent Bed Mobility: Rolling;Sidelying to Sit Rolling: Modified independent (Device/Increase time)   Supine to sit: Modified independent (Device/Increase time)     General bed mobility comments: Mod I for bed mobility    Transfers Overall transfer level: Needs assistance Equipment used: Rolling walker (2 wheeled) Transfers: Sit to/from Stand Sit to Stand: Supervision         General transfer comment: SPV provided with initial standing for safety with B UE  assistance  Ambulation/Gait Ambulation/Gait assistance: Min guard Gait Distance (Feet): 540 Feet Assistive device: Rolling walker (2 wheeled) Gait Pattern/deviations: Step-through pattern;Decreased step length - left;Decreased stance time - left;Antalgic;Trunk flexed Gait velocity: decreased   General Gait Details: Pt cued for improving step through gait pattern for more normalized gait pattern. Pt continues to demonstrate significant antalgic gait secondary to reports of L hip stiffness and soreness. Pt attempted to ambulate 10 ft without usage of RW requiring minA for balance and ambulated with high guard position of hands to improve balance.   Stairs             Wheelchair Mobility    Modified Rankin (Stroke Patients Only)       Balance Overall balance assessment: Needs assistance Sitting-balance support: No upper extremity supported;Feet supported Sitting balance-Leahy Scale: Normal     Standing balance support: No upper extremity supported Standing balance-Leahy Scale: Good Standing balance comment: Able to remove hands from RW and maintain static standing balance. Dynamic balance without UE support requires minA to correct and maintain safety.       Cognition Arousal/Alertness: Awake/alert Behavior During Therapy: WFL for tasks assessed/performed Overall Cognitive Status: Within Functional Limits for tasks assessed        General Comments: Pleasant throughout session.      Exercises General Exercises - Lower Extremity Long Arc Quad: AROM;Strengthening;20 reps;Seated Hip Flexion/Marching: 20 reps;Seated;AROM Heel Raises: AROM;20 reps;Standing (No UE support. MinA required for balance) Other Exercises Other Exercises: Pt assisted with pericare after bowel movement in bed. Pt able to roll with usage of rails.    General Comments        Pertinent Vitals/Pain Pain Assessment: No/denies pain  Home Living                      Prior Function             PT Goals (current goals can now be found in the care plan section) Acute Rehab PT Goals Patient Stated Goal: I want to get help at geri-psych facility ( hx of alcoholism) PT Goal Formulation: With patient Time For Goal Achievement: 12/23/20 Potential to Achieve Goals: Good Progress towards PT goals: Progressing toward goals    Frequency    Min 2X/week      PT Plan Current plan remains appropriate    Co-evaluation              AM-PAC PT "6 Clicks" Mobility   Outcome Measure  Help needed turning from your back to your side while in a flat bed without using bedrails?: None Help needed moving from lying on your back to sitting on the side of a flat bed without using bedrails?: None Help needed moving to and from a bed to a chair (including a wheelchair)?: None Help needed standing up from a chair using your arms (e.g., wheelchair or bedside chair)?: None Help needed to walk in hospital room?: A Little Help needed climbing 3-5 steps with a railing? : A Little 6 Click Score: 22    End of Session Equipment Utilized During Treatment: Gait belt Activity Tolerance: Patient tolerated treatment well Patient left: in chair;with chair alarm set;with call bell/phone within reach Nurse Communication: Mobility status PT Visit Diagnosis: Unsteadiness on feet (R26.81);Other abnormalities of gait and mobility (R26.89);Muscle weakness (generalized) (M62.81);History of falling (Z91.81)     Time: 9562-1308 PT Time Calculation (min) (ACUTE ONLY): 21 min  Charges:  $Gait Training: 8-22 mins                     Verl Blalock, SPT   Verl Blalock 12/11/2020, 12:57 PM

## 2020-12-11 NOTE — Progress Notes (Signed)
PROGRESS NOTE    James Sanford  NUU:[id number] DOB: 21-Mar-1956 DOA: 11/23/2020 PCP: Jerl Mina, MD    Brief Narrative:  65 year old man with past medical history of hypertension hyperlipidemia atrial fibrillation and alcohol abuse presents to the hospital after a fall and found to have a left hip fracture.  On 11/25/2020 Dr. Odis Luster did a left intramedullary nail intertrochanteric procedure for left hip fracture.  The patient was seen by psychiatry and they recommended a Geri psych bed for moderate major depression.  The patient is also having diarrhea which I started cholestyramine.  Stool comprehensive panel was negative.  Patient been here too long for C. difficile testing.  White blood cell count normal and afebrile.   Assessment & Plan:   Principal Problem:   Moderate major depression, single episode (HCC) Active Problems:   Alcohol abuse   Diabetes mellitus type 2, uncomplicated (HCC)   AF (paroxysmal atrial fibrillation) (HCC)   Essential hypertension   Closed nondisplaced intertrochanteric fracture of left femur (HCC)   Acute lower UTI   Protein-calorie malnutrition, severe   Malnutrition of moderate degree   Diarrhea   Impaired fasting glucose   Hypokalemia   Iron deficiency anemia  Major depression and initial suicidal ideation.  Patient on fluoxetine.  Awaiting a Geri psych bed.  Unfortunately no bed offers currently. Diarrhea.  Continue Questran, dose decreased to twice daily.  Continue psyllium and simethicone add daily psyllium and 4 times daily simethicone. Left intertrochanteric hip fracture status post left intramedullary nail fixation on 11/25/2020 by Dr. Odis Luster.  Patient walking well with physical therapy.  Cleared for discharge from orthopedic standpoint  paroxysmal atrial fibrillation on metoprolol for rate control and Eliquis for anticoagulation to prevent stroke. Alcohol abuse.  Continue thiamine, folic acid and multivitamin Hypokalemia.  Replace  potassium on a daily basis. Iron deficiency anemia.  Stable.  B12 normal range.  Continue oral iron supplementation   DVT prophylaxis: Eliquis Code Status: Full Family Communication: None today Disposition Plan: Status is: Inpatient  Remains inpatient appropriate because:Inpatient level of care appropriate due to severity of illness.  Unsafe discharge plan  Dispo: The patient is from: Home              Anticipated d/c is to:  [facility] psych inpatient facility              Patient currently is medically stable to d/c.   Difficult to place patient Yes  Needs Geri psych facility.  Currently no bed offers.     Level of care: Med-Surg  Consultants:  Orthopedics  Procedures:  IM nail for hip fracture 8/23  Antimicrobials: None   Subjective: Seen and examined.  Sitting comfortably in bed.  No visible distress.  No complaints  Objective: Vitals:   12/10/20 2006 12/10/20 2328 12/11/20 0339 12/11/20 0801  BP: 96/75 117/85 113/84 113/77  Pulse: 63 67 65 (!) 57  Resp: 17 15 16 16   Temp: 98.2 F (36.8 C) 98.5 F (36.9 C) 98.1 F (36.7 C) 98.6 F (37 C)  TempSrc: Oral Oral Oral   SpO2: 99% 100% 99% 99%  Weight:      Height:        Intake/Output Summary (Last 24 hours) at 12/11/2020 1026 Last data filed at 12/11/2020 0800 Gross per 24 hour  Intake 380 ml  Output 1600 ml  Net -1220 ml   Filed Weights   11/23/20 1438  Weight: 77.1 kg    Examination:  General exam: No acute distress  Respiratory system: Lungs clear.  Normal work of breathing.  Room air Cardiovascular system: S1-S2, RRR, no murmurs, no pedal edema Gastrointestinal system: Abdomen is nondistended, soft and nontender. No organomegaly or masses felt. Normal bowel sounds heard. Central nervous system: Alert, oriented x2, no focal deficits Extremities: Symmetric 5 x 5 power. Skin: No rashes, lesions or ulcers Psychiatry: Judgement and insight appear normal. Mood & affect appropriate.     Data Reviewed: I  have personally reviewed following labs and imaging studies  CBC: Recent Labs  Lab 12/09/20 0510  WBC 5.2  HGB 10.4*  HCT 31.3*  MCV 99.7  PLT 307   Basic Metabolic Panel: Recent Labs  Lab 12/06/20 0410 12/07/20 0436 12/10/20 0453  NA 141  --   --   K 3.2* 3.6 3.9  CL 113*  --   --   CO2 24  --   --   GLUCOSE 93  --   --   BUN 19  --   --   CREATININE 0.54*  --   --   CALCIUM 8.0*  --   --   MG 1.8 1.9 1.7  PHOS  --  3.7  --    GFR: Estimated Creatinine Clearance: 103.1 mL/min (A) (by C-G formula based on SCr of 0.54 mg/dL (L)). Liver Function Tests: No results for input(s): AST, ALT, ALKPHOS, BILITOT, PROT, ALBUMIN in the last 168 hours. No results for input(s): LIPASE, AMYLASE in the last 168 hours. No results for input(s): AMMONIA in the last 168 hours. Coagulation Profile: No results for input(s): INR, PROTIME in the last 168 hours. Cardiac Enzymes: No results for input(s): CKTOTAL, CKMB, CKMBINDEX, TROPONINI in the last 168 hours. BNP (last 3 results) No results for input(s): PROBNP in the last 8760 hours. HbA1C: No results for input(s): HGBA1C in the last 72 hours. CBG: No results for input(s): GLUCAP in the last 168 hours. Lipid Profile: No results for input(s): CHOL, HDL, LDLCALC, TRIG, CHOLHDL, LDLDIRECT in the last 72 hours. Thyroid Function Tests: No results for input(s): TSH, T4TOTAL, FREET4, T3FREE, THYROIDAB in the last 72 hours. Anemia Panel: No results for input(s): VITAMINB12, FOLATE, FERRITIN, TIBC, IRON, RETICCTPCT in the last 72 hours. Sepsis Labs: No results for input(s): PROCALCITON, LATICACIDVEN in the last 168 hours.  Recent Results (from the past 240 hour(s))  Gastrointestinal Panel by PCR , Stool     Status: None   Collection Time: 12/06/20  5:50 PM   Specimen: Stool  Result Value Ref Range Status   Campylobacter species NOT DETECTED NOT DETECTED Final   Plesimonas shigelloides NOT DETECTED NOT DETECTED Final   Salmonella species  NOT DETECTED NOT DETECTED Final   Yersinia enterocolitica NOT DETECTED NOT DETECTED Final   Vibrio species NOT DETECTED NOT DETECTED Final   Vibrio cholerae NOT DETECTED NOT DETECTED Final   Enteroaggregative E coli (EAEC) NOT DETECTED NOT DETECTED Final   Enteropathogenic E coli (EPEC) NOT DETECTED NOT DETECTED Final   Enterotoxigenic E coli (ETEC) NOT DETECTED NOT DETECTED Final   Shiga like toxin producing E coli (STEC) NOT DETECTED NOT DETECTED Final   Shigella/Enteroinvasive E coli (EIEC) NOT DETECTED NOT DETECTED Final   Cryptosporidium NOT DETECTED NOT DETECTED Final   Cyclospora cayetanensis NOT DETECTED NOT DETECTED Final   Entamoeba histolytica NOT DETECTED NOT DETECTED Final   Giardia lamblia NOT DETECTED NOT DETECTED Final   Adenovirus F40/41 NOT DETECTED NOT DETECTED Final   Astrovirus NOT DETECTED NOT DETECTED Final   Norovirus GI/GII NOT  DETECTED NOT DETECTED Final   Rotavirus A NOT DETECTED NOT DETECTED Final   Sapovirus (I, II, IV, and V) NOT DETECTED NOT DETECTED Final    Comment: Performed at [facility], [address] [place], [place] [address]         Radiology Studies: No results found.      Scheduled Meds:  apixaban  5 mg Oral BID   cholestyramine  4 g Oral TID   ferrous sulfate  325 mg Oral Q breakfast   FLUoxetine  20 mg Oral Daily   folic acid  1 mg Oral Daily   metoprolol succinate  25 mg Oral Daily   multivitamin with minerals  1 tablet Oral Daily   potassium chloride  40 mEq Oral Daily   psyllium  1 packet Oral Daily   simethicone  80 mg Oral QID   thiamine  100 mg Oral Daily   Continuous Infusions:   LOS: 17 days    Time spent: 15 minutes    Tresa Moore, MD [facility] Pager [phone]  If 7PM-7AM, please contact night-coverage  12/11/2020, 10:26 AM

## 2020-12-12 DIAGNOSIS — F321 Major depressive disorder, single episode, moderate: Secondary | ICD-10-CM | POA: Diagnosis not present

## 2020-12-12 LAB — GLUCOSE, CAPILLARY: Glucose-Capillary: 89 mg/dL (ref 70–99)

## 2020-12-12 NOTE — Plan of Care (Signed)
  Problem: Activity: Goal: Risk for activity intolerance will decrease Outcome: Progressing   Problem: Nutrition: Goal: Adequate nutrition will be maintained Outcome: Progressing   

## 2020-12-12 NOTE — TOC Progression Note (Signed)
Transition of Care [facility]) - Progression Note    Patient Details  Name: James Sanford MRN: [id number] Date of Birth: 10-27-1955  [facility] CM/SW Contact  Caryn Section, RN Phone Number: 12/12/2020, 3:42 PM  Clinical Narrative:  Continue to search for beds.  No inpatient psychiatric beds at this time.  Patient's wife aware.  TOC to follow to discharge.          Expected Discharge Plan and Services                                                 Social Determinants of Health (SDOH) Interventions    Readmission Risk Interventions No flowsheet data found.

## 2020-12-12 NOTE — Progress Notes (Signed)
Physical Therapy Treatment Patient Details Name: James Sanford MRN: [id number] DOB: 02-25-1956 Today's Date: 12/12/2020    History of Present Illness Per MD notes, pt is a 65 y.o. male who presented to the ER via EMS for evaluation of a fall with a left intertrochanteric hip fx and is currently s/p IM nail WBAT LLE. PMH includes alcohol abuse, diabetes mellitus, hypertension, sleep apnea, paroxysmal atrial fibrillation on anticoagulation. MD assessment includes severe depression with suicidal ideation, UTI, alcohol abuse, and displaced, impacted intertrochanteric fracture of the left femur.    PT Comments    Pt was pleasant and motivated to participate during the session and overall continued to make good progress towards goals.  Pt was steady with ambulation with no reported adverse symptoms including no L hip pain.  Pt did tend to lean on his UEs on the walker with trunk flexed but improved with cuing.  Pt's SpO2 and HR were both WNL during the session.  Pt will benefit from HHPT upon discharge to safely address deficits listed in patient problem list for decreased caregiver assistance and eventual return to PLOF.      Follow Up Recommendations  Home health PT     Equipment Recommendations  Rolling walker with 5" wheels    Recommendations for Other Services       Precautions / Restrictions Precautions Precautions: Fall Restrictions LLE Weight Bearing: Weight bearing as tolerated    Mobility  Bed Mobility               General bed mobility comments: NT, pt in recliner    Transfers Overall transfer level: Needs assistance Equipment used: Rolling walker (2 wheeled) Transfers: Sit to/from Stand Sit to Stand: Supervision         General transfer comment: Good eccentric and concentric control and stability with use of BUE assistance  Ambulation/Gait Ambulation/Gait assistance: Min guard;Supervision Gait Distance (Feet): 400 Feet x 1, 300 Feet x 1 Assistive  device: Rolling walker (2 wheeled) Gait Pattern/deviations: Step-through pattern;Decreased step length - left;Trunk flexed;Decreased step length - right Gait velocity: decreased   General Gait Details: Mod verbal and visual cues for upright posture and decreased WB through the UE's on the walker with fair carryover and pt abel to self-correct at times   Stairs             Wheelchair Mobility    Modified Rankin (Stroke Patients Only)       Balance Overall balance assessment: Needs assistance Sitting-balance support: No upper extremity supported;Feet supported Sitting balance-Leahy Scale: Normal     Standing balance support: No upper extremity supported Standing balance-Leahy Scale: Good Standing balance comment: Good static and dynamic standing balance without UE support                            Cognition Arousal/Alertness: Awake/alert Behavior During Therapy: WFL for tasks assessed/performed Overall Cognitive Status: Within Functional Limits for tasks assessed                                        Exercises Other Exercises Other Exercises: Static and dynamic standing balance training without UE support with feet apart    General Comments        Pertinent Vitals/Pain Pain Assessment: No/denies pain    Home Living  Prior Function            PT Goals (current goals can now be found in the care plan section) Progress towards PT goals: Progressing toward goals    Frequency    Min 2X/week      PT Plan Current plan remains appropriate    Co-evaluation              AM-PAC PT "6 Clicks" Mobility   Outcome Measure  Help needed turning from your back to your side while in a flat bed without using bedrails?: None Help needed moving from lying on your back to sitting on the side of a flat bed without using bedrails?: None Help needed moving to and from a bed to a chair (including a  wheelchair)?: A Little Help needed standing up from a chair using your arms (e.g., wheelchair or bedside chair)?: A Little Help needed to walk in hospital room?: A Little Help needed climbing 3-5 steps with a railing? : A Little 6 Click Score: 20    End of Session Equipment Utilized During Treatment: Gait belt Activity Tolerance: Patient tolerated treatment well Patient left: in chair;with chair alarm set;with call bell/phone within reach Nurse Communication: Mobility status PT Visit Diagnosis: Unsteadiness on feet (R26.81);Other abnormalities of gait and mobility (R26.89);Muscle weakness (generalized) (M62.81);History of falling (Z91.81) Pain - Right/Left: Left Pain - part of body: Hip     Time: 7169-6789 PT Time Calculation (min) (ACUTE ONLY): 33 min  Charges:  $Gait Training: 8-22 mins $Therapeutic Exercise: 8-22 mins                     D. Scott Avantae Bither PT, DPT 12/12/20, 5:34 PM

## 2020-12-12 NOTE — Progress Notes (Signed)
PROGRESS NOTE    James Sanford  OZD:[id number] DOB: 23-Nov-1955 DOA: 11/23/2020 PCP: Jerl Mina, MD    Brief Narrative:  65 year old man with past medical history of hypertension hyperlipidemia atrial fibrillation and alcohol abuse presents to the hospital after a fall and found to have a left hip fracture.  On 11/25/2020 Dr. Odis Luster did a left intramedullary nail intertrochanteric procedure for left hip fracture.  The patient was seen by psychiatry and they recommended a Geri psych bed for moderate major depression.  The patient is also having diarrhea which I started cholestyramine.  Stool comprehensive panel was negative.  Patient been here too long for C. difficile testing.  White blood cell count normal and afebrile.   Assessment & Plan:   Principal Problem:   Moderate major depression, single episode (HCC) Active Problems:   Alcohol abuse   Diabetes mellitus type 2, uncomplicated (HCC)   AF (paroxysmal atrial fibrillation) (HCC)   Essential hypertension   Closed nondisplaced intertrochanteric fracture of left femur (HCC)   Acute lower UTI   Protein-calorie malnutrition, severe   Malnutrition of moderate degree   Diarrhea   Impaired fasting glucose   Hypokalemia   Iron deficiency anemia  Major depression and initial suicidal ideation.  Patient on fluoxetine.  Awaiting a Geri psych bed.  Unfortunately no bed offers currently. Diarrhea.  Continue Questran, 4 g twice daily.  Continue psyllium and simethicone add daily psyllium and 4 times daily simethicone. Left intertrochanteric hip fracture status post left intramedullary nail fixation on 11/25/2020 by Dr. Odis Luster.  Patient walking well with physical therapy.  Cleared for discharge from orthopedic standpoint  paroxysmal atrial fibrillation on metoprolol for rate control and Eliquis for anticoagulation to prevent stroke. Alcohol abuse.  Continue thiamine, folic acid and multivitamin Hypokalemia.  Replace potassium on a  daily basis. Iron deficiency anemia.  Stable.  B12 normal range.  Continue oral iron supplementation   DVT prophylaxis: Eliquis Code Status: Full Family Communication: None today Disposition Plan: Status is: Inpatient  Remains inpatient appropriate because:Inpatient level of care appropriate due to severity of illness.  Unsafe discharge plan  Dispo: The patient is from: Home              Anticipated d/c is to:  [facility] psych inpatient facility              Patient currently is medically stable to d/c.   Difficult to place patient Yes  Needs Geri psych facility.  Currently no bed offers.     Level of care: Med-Surg  Consultants:  Orthopedics  Procedures:  IM nail for hip fracture 8/23  Antimicrobials: None   Subjective: Seen and examined.  Sitting comfortably in bed.  No visible distress.  No complaints  Objective: Vitals:   12/11/20 1958 12/11/20 2349 12/12/20 0359 12/12/20 0746  BP: 112/81 106/80 121/76 105/75  Pulse: 72 64 62 65  Resp: 19 19 18 18   Temp: 98.3 F (36.8 C) 98.4 F (36.9 C) 98.5 F (36.9 C) 98.3 F (36.8 C)  TempSrc: Oral     SpO2: 100% 100% 98% 100%  Weight:      Height:        Intake/Output Summary (Last 24 hours) at 12/12/2020 1051 Last data filed at 12/12/2020 1021 Gross per 24 hour  Intake 480 ml  Output 400 ml  Net 80 ml   Filed Weights   11/23/20 1438  Weight: 77.1 kg    Examination:  General exam: No acute distress Respiratory system:  Lungs clear.  Normal work of breathing.  Room air Cardiovascular system: S1-S2, RRR, no murmurs, no pedal edema Gastrointestinal system: Abdomen is nondistended, soft and nontender. No organomegaly or masses felt. Normal bowel sounds heard. Central nervous system: Alert, oriented x2, no focal deficits Extremities: Symmetric 5 x 5 power. Skin: No rashes, lesions or ulcers Psychiatry: Judgement and insight appear normal. Mood & affect appropriate.     Data Reviewed: I have personally reviewed  following labs and imaging studies  CBC: Recent Labs  Lab 12/09/20 0510  WBC 5.2  HGB 10.4*  HCT 31.3*  MCV 99.7  PLT 307   Basic Metabolic Panel: Recent Labs  Lab 12/06/20 0410 12/07/20 0436 12/10/20 0453  NA 141  --   --   K 3.2* 3.6 3.9  CL 113*  --   --   CO2 24  --   --   GLUCOSE 93  --   --   BUN 19  --   --   CREATININE 0.54*  --   --   CALCIUM 8.0*  --   --   MG 1.8 1.9 1.7  PHOS  --  3.7  --    GFR: Estimated Creatinine Clearance: 103.1 mL/min (A) (by C-G formula based on SCr of 0.54 mg/dL (L)). Liver Function Tests: No results for input(s): AST, ALT, ALKPHOS, BILITOT, PROT, ALBUMIN in the last 168 hours. No results for input(s): LIPASE, AMYLASE in the last 168 hours. No results for input(s): AMMONIA in the last 168 hours. Coagulation Profile: No results for input(s): INR, PROTIME in the last 168 hours. Cardiac Enzymes: No results for input(s): CKTOTAL, CKMB, CKMBINDEX, TROPONINI in the last 168 hours. BNP (last 3 results) No results for input(s): PROBNP in the last 8760 hours. HbA1C: No results for input(s): HGBA1C in the last 72 hours. CBG: No results for input(s): GLUCAP in the last 168 hours. Lipid Profile: No results for input(s): CHOL, HDL, LDLCALC, TRIG, CHOLHDL, LDLDIRECT in the last 72 hours. Thyroid Function Tests: No results for input(s): TSH, T4TOTAL, FREET4, T3FREE, THYROIDAB in the last 72 hours. Anemia Panel: No results for input(s): VITAMINB12, FOLATE, FERRITIN, TIBC, IRON, RETICCTPCT in the last 72 hours. Sepsis Labs: No results for input(s): PROCALCITON, LATICACIDVEN in the last 168 hours.  Recent Results (from the past 240 hour(s))  Gastrointestinal Panel by PCR , Stool     Status: None   Collection Time: 12/06/20  5:50 PM   Specimen: Stool  Result Value Ref Range Status   Campylobacter species NOT DETECTED NOT DETECTED Final   Plesimonas shigelloides NOT DETECTED NOT DETECTED Final   Salmonella species NOT DETECTED NOT DETECTED  Final   Yersinia enterocolitica NOT DETECTED NOT DETECTED Final   Vibrio species NOT DETECTED NOT DETECTED Final   Vibrio cholerae NOT DETECTED NOT DETECTED Final   Enteroaggregative E coli (EAEC) NOT DETECTED NOT DETECTED Final   Enteropathogenic E coli (EPEC) NOT DETECTED NOT DETECTED Final   Enterotoxigenic E coli (ETEC) NOT DETECTED NOT DETECTED Final   Shiga like toxin producing E coli (STEC) NOT DETECTED NOT DETECTED Final   Shigella/Enteroinvasive E coli (EIEC) NOT DETECTED NOT DETECTED Final   Cryptosporidium NOT DETECTED NOT DETECTED Final   Cyclospora cayetanensis NOT DETECTED NOT DETECTED Final   Entamoeba histolytica NOT DETECTED NOT DETECTED Final   Giardia lamblia NOT DETECTED NOT DETECTED Final   Adenovirus F40/41 NOT DETECTED NOT DETECTED Final   Astrovirus NOT DETECTED NOT DETECTED Final   Norovirus GI/GII NOT DETECTED NOT  DETECTED Final   Rotavirus A NOT DETECTED NOT DETECTED Final   Sapovirus (I, II, IV, and V) NOT DETECTED NOT DETECTED Final    Comment: Performed at [facility], [address], [place], [place] [address]         Radiology Studies: No results found.      Scheduled Meds:  apixaban  5 mg Oral BID   cholestyramine  4 g Oral BID   ferrous sulfate  325 mg Oral Q breakfast   FLUoxetine  20 mg Oral Daily   folic acid  1 mg Oral Daily   metoprolol succinate  25 mg Oral Daily   multivitamin with minerals  1 tablet Oral Daily   potassium chloride  40 mEq Oral Daily   psyllium  1 packet Oral Daily   simethicone  80 mg Oral QID   thiamine  100 mg Oral Daily   Continuous Infusions:   LOS: 18 days    Time spent: 15 minutes    Tresa Moore, MD [facility] Pager [phone]  If 7PM-7AM, please contact night-coverage  12/12/2020, 10:51 AM

## 2020-12-13 DIAGNOSIS — F321 Major depressive disorder, single episode, moderate: Secondary | ICD-10-CM | POA: Diagnosis not present

## 2020-12-13 MED ORDER — DICYCLOMINE HCL 20 MG PO TABS
20.0000 mg | ORAL_TABLET | Freq: Three times a day (TID) | ORAL | Status: DC
  Administered 2020-12-13 – 2020-12-21 (×32): 20 mg via ORAL
  Filled 2020-12-13 (×38): qty 1

## 2020-12-13 NOTE — Plan of Care (Signed)
  Problem: Education: Goal: Verbalization of understanding the information provided (i.e., activity precautions, restrictions, etc) will improve Outcome: Progressing Goal: Individualized Educational Video(s) Outcome: Progressing   Problem: Activity: Goal: Ability to ambulate and perform ADLs will improve Outcome: Progressing   Problem: Clinical Measurements: Goal: Postoperative complications will be avoided or minimized Outcome: Progressing   Problem: Self-Concept: Goal: Ability to maintain and perform role responsibilities to the fullest extent possible will improve Outcome: Progressing   Problem: Education: Goal: Knowledge of General Education information will improve Description: Including pain rating scale, medication(s)/side effects and non-pharmacologic comfort measures Outcome: Progressing   Problem: Health Behavior/Discharge Planning: Goal: Ability to manage health-related needs will improve Outcome: Progressing   Problem: Clinical Measurements: Goal: Diagnostic test results will improve Outcome: Progressing Goal: Respiratory complications will improve Outcome: Progressing Goal: Cardiovascular complication will be avoided Outcome: Progressing   Problem: Activity: Goal: Risk for activity intolerance will decrease Outcome: Progressing   Problem: Nutrition: Goal: Adequate nutrition will be maintained Outcome: Progressing   Problem: Coping: Goal: Level of anxiety will decrease Outcome: Progressing   Problem: Elimination: Goal: Will not experience complications related to urinary retention Outcome: Progressing   Problem: Pain Managment: Goal: General experience of comfort will improve Outcome: Progressing   Problem: Safety: Goal: Ability to remain free from injury will improve Outcome: Progressing   Problem: Skin Integrity: Goal: Risk for impaired skin integrity will decrease Outcome: Progressing   Problem: Education: Goal: Knowledge of General  Education information will improve Description: Including pain rating scale, medication(s)/side effects and non-pharmacologic comfort measures Outcome: Progressing   Problem: Health Behavior/Discharge Planning: Goal: Ability to manage health-related needs will improve Outcome: Progressing   Problem: Clinical Measurements: Goal: Ability to maintain clinical measurements within normal limits will improve Outcome: Progressing Goal: Will remain free from infection Outcome: Progressing Goal: Diagnostic test results will improve Outcome: Progressing Goal: Respiratory complications will improve Outcome: Progressing Goal: Cardiovascular complication will be avoided Outcome: Progressing   Problem: Activity: Goal: Risk for activity intolerance will decrease Outcome: Progressing   Problem: Nutrition: Goal: Adequate nutrition will be maintained Outcome: Progressing   Problem: Coping: Goal: Level of anxiety will decrease Outcome: Progressing   Problem: Elimination: Goal: Will not experience complications related to bowel motility Outcome: Progressing Goal: Will not experience complications related to urinary retention Outcome: Progressing   Problem: Pain Managment: Goal: General experience of comfort will improve Outcome: Progressing   Problem: Safety: Goal: Ability to remain free from injury will improve Outcome: Progressing   Problem: Skin Integrity: Goal: Risk for impaired skin integrity will decrease Outcome: Progressing   

## 2020-12-13 NOTE — Progress Notes (Addendum)
PROGRESS NOTE    James Sanford  UTM:[id number] DOB: 10/08/1955 DOA: 11/23/2020 PCP: Jerl Mina, MD    Brief Narrative:  65 year old man with past medical history of hypertension hyperlipidemia atrial fibrillation and alcohol abuse presents to the hospital after a fall and found to have a left hip fracture.  On 11/25/2020 Dr. Odis Luster did a left intramedullary nail intertrochanteric procedure for left hip fracture.  The patient was seen by psychiatry and they recommended a Geri psych bed for moderate major depression.  The patient is also having diarrhea which I started cholestyramine.  Stool comprehensive panel was negative.  Patient been here too long for C. difficile testing.  White blood cell count normal and afebrile.   Assessment & Plan:   Principal Problem:   Moderate major depression, single episode (HCC) Active Problems:   Alcohol abuse   Diabetes mellitus type 2, uncomplicated (HCC)   AF (paroxysmal atrial fibrillation) (HCC)   Essential hypertension   Closed nondisplaced intertrochanteric fracture of left femur (HCC)   Acute lower UTI   Protein-calorie malnutrition, severe   Malnutrition of moderate degree   Diarrhea   Impaired fasting glucose   Hypokalemia   Iron deficiency anemia  Major depression and initial suicidal ideation.  Patient on fluoxetine.  Awaiting a Geri psych bed.  Unfortunately no bed offers currently. Diarrhea.  Resolved.  Discontinue Questran.  Continue simethicone and psyllium. Left intertrochanteric hip fracture status post left intramedullary nail fixation on 11/25/2020 by Dr. Odis Luster.  Patient walking well with physical therapy.  Cleared for discharge from orthopedic standpoint  paroxysmal atrial fibrillation on metoprolol for rate control and Eliquis for anticoagulation to prevent stroke. Alcohol abuse.  Continue thiamine, folic acid and multivitamin Hypokalemia.  Replace potassium on a daily basis. Iron deficiency anemia.  Stable.  B12  normal range.  Continue oral iron supplementation   DVT prophylaxis: Eliquis Code Status: Full Family Communication: Wife [name] [phone] on 9/10 Disposition Plan: Status is: Inpatient  Remains inpatient appropriate because:Inpatient level of care appropriate due to severity of illness.  Unsafe discharge plan  Dispo: The patient is from: Home              Anticipated d/c is to:  [facility] psych inpatient facility              Patient currently is medically stable to d/c.   Difficult to place patient Yes  Needs Geri psych facility.  Currently no bed offers.     Level of care: Med-Surg  Consultants:  Orthopedics  Procedures:  IM nail for hip fracture 8/23  Antimicrobials: None   Subjective: Seen and examined.  Resting comfortably in bed.  No visible distress.  Objective: Vitals:   12/12/20 1506 12/12/20 2102 12/13/20 0228 12/13/20 0822  BP: 104/78 107/78 114/88 122/80  Pulse: 73 70 61 66  Resp: 18 16 17 16   Temp: 98.6 F (37 C) 98.4 F (36.9 C) 97.7 F (36.5 C) 97.7 F (36.5 C)  TempSrc: Oral Oral Oral   SpO2: 99% 100% 100% 100%  Weight:      Height:        Intake/Output Summary (Last 24 hours) at 12/13/2020 1028 Last data filed at 12/13/2020 0220 Gross per 24 hour  Intake --  Output 325 ml  Net -325 ml   Filed Weights   11/23/20 1438  Weight: 77.1 kg    Examination:  General exam: No acute distress Respiratory system: Lungs clear.  Normal work of breathing.  Room air Cardiovascular  system: S1-S2, RRR, no murmurs, no pedal edema Gastrointestinal system: Abdomen is nondistended, soft and nontender. No organomegaly or masses felt. Normal bowel sounds heard. Central nervous system: Alert, oriented x2, no focal deficits Extremities: Symmetric 5 x 5 power. Skin: No rashes, lesions or ulcers Psychiatry: Judgement and insight appear normal. Mood & affect appropriate.     Data Reviewed: I have personally reviewed following labs and imaging  studies  CBC: Recent Labs  Lab 12/09/20 0510  WBC 5.2  HGB 10.4*  HCT 31.3*  MCV 99.7  PLT 307   Basic Metabolic Panel: Recent Labs  Lab 12/07/20 0436 12/10/20 0453  K 3.6 3.9  MG 1.9 1.7  PHOS 3.7  --    GFR: Estimated Creatinine Clearance: 103.1 mL/min (A) (by C-G formula based on SCr of 0.54 mg/dL (L)). Liver Function Tests: No results for input(s): AST, ALT, ALKPHOS, BILITOT, PROT, ALBUMIN in the last 168 hours. No results for input(s): LIPASE, AMYLASE in the last 168 hours. No results for input(s): AMMONIA in the last 168 hours. Coagulation Profile: No results for input(s): INR, PROTIME in the last 168 hours. Cardiac Enzymes: No results for input(s): CKTOTAL, CKMB, CKMBINDEX, TROPONINI in the last 168 hours. BNP (last 3 results) No results for input(s): PROBNP in the last 8760 hours. HbA1C: No results for input(s): HGBA1C in the last 72 hours. CBG: Recent Labs  Lab 12/12/20 2140  GLUCAP 89   Lipid Profile: No results for input(s): CHOL, HDL, LDLCALC, TRIG, CHOLHDL, LDLDIRECT in the last 72 hours. Thyroid Function Tests: No results for input(s): TSH, T4TOTAL, FREET4, T3FREE, THYROIDAB in the last 72 hours. Anemia Panel: No results for input(s): VITAMINB12, FOLATE, FERRITIN, TIBC, IRON, RETICCTPCT in the last 72 hours. Sepsis Labs: No results for input(s): PROCALCITON, LATICACIDVEN in the last 168 hours.  Recent Results (from the past 240 hour(s))  Gastrointestinal Panel by PCR , Stool     Status: None   Collection Time: 12/06/20  5:50 PM   Specimen: Stool  Result Value Ref Range Status   Campylobacter species NOT DETECTED NOT DETECTED Final   Plesimonas shigelloides NOT DETECTED NOT DETECTED Final   Salmonella species NOT DETECTED NOT DETECTED Final   Yersinia enterocolitica NOT DETECTED NOT DETECTED Final   Vibrio species NOT DETECTED NOT DETECTED Final   Vibrio cholerae NOT DETECTED NOT DETECTED Final   Enteroaggregative E coli (EAEC) NOT DETECTED  NOT DETECTED Final   Enteropathogenic E coli (EPEC) NOT DETECTED NOT DETECTED Final   Enterotoxigenic E coli (ETEC) NOT DETECTED NOT DETECTED Final   Shiga like toxin producing E coli (STEC) NOT DETECTED NOT DETECTED Final   Shigella/Enteroinvasive E coli (EIEC) NOT DETECTED NOT DETECTED Final   Cryptosporidium NOT DETECTED NOT DETECTED Final   Cyclospora cayetanensis NOT DETECTED NOT DETECTED Final   Entamoeba histolytica NOT DETECTED NOT DETECTED Final   Giardia lamblia NOT DETECTED NOT DETECTED Final   Adenovirus F40/41 NOT DETECTED NOT DETECTED Final   Astrovirus NOT DETECTED NOT DETECTED Final   Norovirus GI/GII NOT DETECTED NOT DETECTED Final   Rotavirus A NOT DETECTED NOT DETECTED Final   Sapovirus (I, II, IV, and V) NOT DETECTED NOT DETECTED Final    Comment: Performed at [facility], [address] [place], [place] [address]         Radiology Studies: No results found.      Scheduled Meds:  apixaban  5 mg Oral BID   ferrous sulfate  325 mg Oral Q breakfast   FLUoxetine  20 mg Oral Daily   folic acid  1 mg Oral Daily   metoprolol succinate  25 mg Oral Daily   multivitamin with minerals  1 tablet Oral Daily   potassium chloride  40 mEq Oral Daily   psyllium  1 packet Oral Daily   simethicone  80 mg Oral QID   thiamine  100 mg Oral Daily   Continuous Infusions:   LOS: 19 days    Time spent: 15 minutes    Tresa Moore, MD [facility] Pager [phone]  If 7PM-7AM, please contact night-coverage  12/13/2020, 10:28 AM

## 2020-12-14 DIAGNOSIS — F321 Major depressive disorder, single episode, moderate: Secondary | ICD-10-CM | POA: Diagnosis not present

## 2020-12-14 NOTE — Plan of Care (Signed)
  Problem: Education: Goal: Verbalization of understanding the information provided (i.e., activity precautions, restrictions, etc) will improve Outcome: Progressing Goal: Individualized Educational Video(s) Outcome: Progressing   Problem: Activity: Goal: Ability to ambulate and perform ADLs will improve Outcome: Progressing   Problem: Clinical Measurements: Goal: Postoperative complications will be avoided or minimized Outcome: Progressing   Problem: Self-Concept: Goal: Ability to maintain and perform role responsibilities to the fullest extent possible will improve Outcome: Progressing   Problem: Education: Goal: Knowledge of General Education information will improve Description: Including pain rating scale, medication(s)/side effects and non-pharmacologic comfort measures Outcome: Progressing   Problem: Health Behavior/Discharge Planning: Goal: Ability to manage health-related needs will improve Outcome: Progressing   Problem: Clinical Measurements: Goal: Diagnostic test results will improve Outcome: Progressing Goal: Respiratory complications will improve Outcome: Progressing Goal: Cardiovascular complication will be avoided Outcome: Progressing   Problem: Activity: Goal: Risk for activity intolerance will decrease Outcome: Progressing   Problem: Nutrition: Goal: Adequate nutrition will be maintained Outcome: Progressing   Problem: Coping: Goal: Level of anxiety will decrease Outcome: Progressing   Problem: Elimination: Goal: Will not experience complications related to urinary retention Outcome: Progressing   Problem: Pain Managment: Goal: General experience of comfort will improve Outcome: Progressing   Problem: Safety: Goal: Ability to remain free from injury will improve Outcome: Progressing   Problem: Skin Integrity: Goal: Risk for impaired skin integrity will decrease Outcome: Progressing   Problem: Education: Goal: Knowledge of General  Education information will improve Description: Including pain rating scale, medication(s)/side effects and non-pharmacologic comfort measures Outcome: Progressing   Problem: Health Behavior/Discharge Planning: Goal: Ability to manage health-related needs will improve Outcome: Progressing   Problem: Clinical Measurements: Goal: Ability to maintain clinical measurements within normal limits will improve Outcome: Progressing Goal: Will remain free from infection Outcome: Progressing Goal: Diagnostic test results will improve Outcome: Progressing Goal: Respiratory complications will improve Outcome: Progressing Goal: Cardiovascular complication will be avoided Outcome: Progressing   Problem: Activity: Goal: Risk for activity intolerance will decrease Outcome: Progressing   Problem: Nutrition: Goal: Adequate nutrition will be maintained Outcome: Progressing   Problem: Coping: Goal: Level of anxiety will decrease Outcome: Progressing   Problem: Elimination: Goal: Will not experience complications related to bowel motility Outcome: Progressing Goal: Will not experience complications related to urinary retention Outcome: Progressing   Problem: Pain Managment: Goal: General experience of comfort will improve Outcome: Progressing   Problem: Safety: Goal: Ability to remain free from injury will improve Outcome: Progressing   Problem: Skin Integrity: Goal: Risk for impaired skin integrity will decrease Outcome: Progressing

## 2020-12-14 NOTE — Progress Notes (Signed)
PROGRESS NOTE    James Sanford  FKC:[id number] DOB: 1956-02-16 DOA: 11/23/2020 PCP: Jerl Mina, MD    Brief Narrative:  65 year old man with past medical history of hypertension hyperlipidemia atrial fibrillation and alcohol abuse presents to the hospital after a fall and found to have a left hip fracture.  On 11/25/2020 Dr. Odis Luster did a left intramedullary nail intertrochanteric procedure for left hip fracture.  The patient was seen by psychiatry and they recommended a Geri psych bed for moderate major depression.  The patient is also having diarrhea which I started cholestyramine.  Stool comprehensive panel was negative.  Patient been here too long for C. difficile testing.  White blood cell count normal and afebrile.   Assessment & Plan:   Principal Problem:   Moderate major depression, single episode (HCC) Active Problems:   Alcohol abuse   Diabetes mellitus type 2, uncomplicated (HCC)   AF (paroxysmal atrial fibrillation) (HCC)   Essential hypertension   Closed nondisplaced intertrochanteric fracture of left femur (HCC)   Acute lower UTI   Protein-calorie malnutrition, severe   Malnutrition of moderate degree   Diarrhea   Impaired fasting glucose   Hypokalemia   Iron deficiency anemia  Major depression and initial suicidal ideation.  Patient on fluoxetine.  Awaiting a Geri psych bed.  Unfortunately no bed offers currently. Diarrhea.  Resolved.  Questran stopped.  Continue simethicone and psyllium Fecal incontinence.  Unclear etiology.  Per patient and wife this has been a chronic issue.  He has had multiple colonoscopies without etiology identified.  We will attempt 4 times daily Bentyl for next 24 to 48 hours.  If ineffective we will pursue CT abdomen pelvis for further investigation. Left intertrochanteric hip fracture status post left intramedullary nail fixation on 11/25/2020 by Dr. Odis Luster.  Patient walking well with physical therapy.  Cleared for discharge from  orthopedic standpoint  paroxysmal atrial fibrillation on metoprolol for rate control and Eliquis for anticoagulation to prevent stroke. Alcohol abuse.  Continue thiamine, folic acid and multivitamin Hypokalemia.  Replace potassium on a daily basis. Iron deficiency anemia.  Stable.  B12 normal range.  Continue oral iron supplementation   DVT prophylaxis: Eliquis Code Status: Full Family Communication: Wife [name] [phone] on 9/10 Disposition Plan: Status is: Inpatient  Remains inpatient appropriate because:Inpatient level of care appropriate due to severity of illness.  Unsafe discharge plan  Dispo: The patient is from: Home              Anticipated d/c is to:  [facility] psych inpatient facility              Patient currently is medically stable to d/c.   Difficult to place patient Yes  Needs Geri psych facility.  Currently no bed offers.     Level of care: Med-Surg  Consultants:  Orthopedics  Procedures:  IM nail for hip fracture 8/23  Antimicrobials: None   Subjective: Seen and examined.  Resting comfortably in bed.  No visible distress.  Objective: Vitals:   12/13/20 2024 12/14/20 0005 12/14/20 0446 12/14/20 0733  BP: 97/73 103/74 110/76 100/68  Pulse: 64 67 67 62  Resp: 20 20 18 16   Temp: 98.1 F (36.7 C) 98.2 F (36.8 C) 97.8 F (36.6 C) 98.2 F (36.8 C)  TempSrc: Oral Oral    SpO2: 100% 100% 99% 99%  Weight:      Height:        Intake/Output Summary (Last 24 hours) at 12/14/2020 1325 Last data filed at 12/14/2020  1014 Gross per 24 hour  Intake 360 ml  Output 851 ml  Net -491 ml   Filed Weights   11/23/20 1438  Weight: 77.1 kg    Examination:  General exam: No acute distress Respiratory system: Lungs clear.  Normal work of breathing.  Room air Cardiovascular system: S1-S2, RRR, no murmurs, no pedal edema Gastrointestinal system: Abdomen is nondistended, soft and nontender. No organomegaly or masses felt. Normal bowel sounds heard. Central  nervous system: Alert, oriented x2, no focal deficits Extremities: Symmetric 5 x 5 power. Skin: No rashes, lesions or ulcers Psychiatry: Judgement and insight appear normal. Mood & affect appropriate.     Data Reviewed: I have personally reviewed following labs and imaging studies  CBC: Recent Labs  Lab 12/09/20 0510  WBC 5.2  HGB 10.4*  HCT 31.3*  MCV 99.7  PLT 307   Basic Metabolic Panel: Recent Labs  Lab 12/10/20 0453  K 3.9  MG 1.7   GFR: Estimated Creatinine Clearance: 103.1 mL/min (A) (by C-G formula based on SCr of 0.54 mg/dL (L)). Liver Function Tests: No results for input(s): AST, ALT, ALKPHOS, BILITOT, PROT, ALBUMIN in the last 168 hours. No results for input(s): LIPASE, AMYLASE in the last 168 hours. No results for input(s): AMMONIA in the last 168 hours. Coagulation Profile: No results for input(s): INR, PROTIME in the last 168 hours. Cardiac Enzymes: No results for input(s): CKTOTAL, CKMB, CKMBINDEX, TROPONINI in the last 168 hours. BNP (last 3 results) No results for input(s): PROBNP in the last 8760 hours. HbA1C: No results for input(s): HGBA1C in the last 72 hours. CBG: Recent Labs  Lab 12/12/20 2140  GLUCAP 89   Lipid Profile: No results for input(s): CHOL, HDL, LDLCALC, TRIG, CHOLHDL, LDLDIRECT in the last 72 hours. Thyroid Function Tests: No results for input(s): TSH, T4TOTAL, FREET4, T3FREE, THYROIDAB in the last 72 hours. Anemia Panel: No results for input(s): VITAMINB12, FOLATE, FERRITIN, TIBC, IRON, RETICCTPCT in the last 72 hours. Sepsis Labs: No results for input(s): PROCALCITON, LATICACIDVEN in the last 168 hours.  Recent Results (from the past 240 hour(s))  Gastrointestinal Panel by PCR , Stool     Status: None   Collection Time: 12/06/20  5:50 PM   Specimen: Stool  Result Value Ref Range Status   Campylobacter species NOT DETECTED NOT DETECTED Final   Plesimonas shigelloides NOT DETECTED NOT DETECTED Final   Salmonella species  NOT DETECTED NOT DETECTED Final   Yersinia enterocolitica NOT DETECTED NOT DETECTED Final   Vibrio species NOT DETECTED NOT DETECTED Final   Vibrio cholerae NOT DETECTED NOT DETECTED Final   Enteroaggregative E coli (EAEC) NOT DETECTED NOT DETECTED Final   Enteropathogenic E coli (EPEC) NOT DETECTED NOT DETECTED Final   Enterotoxigenic E coli (ETEC) NOT DETECTED NOT DETECTED Final   Shiga like toxin producing E coli (STEC) NOT DETECTED NOT DETECTED Final   Shigella/Enteroinvasive E coli (EIEC) NOT DETECTED NOT DETECTED Final   Cryptosporidium NOT DETECTED NOT DETECTED Final   Cyclospora cayetanensis NOT DETECTED NOT DETECTED Final   Entamoeba histolytica NOT DETECTED NOT DETECTED Final   Giardia lamblia NOT DETECTED NOT DETECTED Final   Adenovirus F40/41 NOT DETECTED NOT DETECTED Final   Astrovirus NOT DETECTED NOT DETECTED Final   Norovirus GI/GII NOT DETECTED NOT DETECTED Final   Rotavirus A NOT DETECTED NOT DETECTED Final   Sapovirus (I, II, IV, and V) NOT DETECTED NOT DETECTED Final    Comment: Performed at [facility], [address] [place],  [place] [address]         Radiology Studies: No results found.      Scheduled Meds:  apixaban  5 mg Oral BID   dicyclomine  20 mg Oral TID AC & HS   ferrous sulfate  325 mg Oral Q breakfast   FLUoxetine  20 mg Oral Daily   folic acid  1 mg Oral Daily   metoprolol succinate  25 mg Oral Daily   multivitamin with minerals  1 tablet Oral Daily   potassium chloride  40 mEq Oral Daily   psyllium  1 packet Oral Daily   simethicone  80 mg Oral QID   thiamine  100 mg Oral Daily   Continuous Infusions:   LOS: 20 days    Time spent: 15 minutes    Tresa Moore, MD [facility] Pager [phone]  If 7PM-7AM, please contact night-coverage  12/14/2020, 1:25 PM

## 2020-12-15 DIAGNOSIS — F321 Major depressive disorder, single episode, moderate: Secondary | ICD-10-CM | POA: Diagnosis not present

## 2020-12-15 LAB — CREATININE, SERUM
Creatinine, Ser: 0.55 mg/dL — ABNORMAL LOW (ref 0.61–1.24)
GFR, Estimated: >60 mL/min (ref >60)

## 2020-12-15 LAB — CBC
HCT: 34.6 % — ABNORMAL LOW (ref 39.0–52.0)
Hemoglobin: 11.4 g/dL — ABNORMAL LOW (ref 13.0–17.0)
MCH: 32.3 pg (ref 26.0–34.0)
MCHC: 32.9 g/dL (ref 30.0–36.0)
MCV: 98 fL (ref 80.0–100.0)
Platelets: 238 10*3/uL (ref 150–400)
RBC: 3.53 MIL/uL — ABNORMAL LOW (ref 4.22–5.81)
RDW: 14.5 % (ref 11.5–15.5)
WBC: 4.9 10*3/uL (ref 4.0–10.5)
nRBC: 0 % (ref 0.0–0.2)

## 2020-12-15 NOTE — Progress Notes (Signed)
PROGRESS NOTE    James Sanford  SFK:[id number] DOB: July 29, 1955 DOA: 11/23/2020 PCP: Jerl Mina, MD    Brief Narrative:  65 year old man with past medical history of hypertension hyperlipidemia atrial fibrillation and alcohol abuse presents to the hospital after a fall and found to have a left hip fracture.  On 11/25/2020 Dr. Odis Luster did a left intramedullary nail intertrochanteric procedure for left hip fracture.  The patient was seen by psychiatry and they recommended a Geri psych bed for moderate major depression.  The patient is also having diarrhea which I started cholestyramine.  Stool comprehensive panel was negative.  Patient been here too long for C. difficile testing.  White blood cell count normal and afebrile.   Assessment & Plan:   Principal Problem:   Moderate major depression, single episode (HCC) Active Problems:   Alcohol abuse   Diabetes mellitus type 2, uncomplicated (HCC)   AF (paroxysmal atrial fibrillation) (HCC)   Essential hypertension   Closed nondisplaced intertrochanteric fracture of left femur (HCC)   Acute lower UTI   Protein-calorie malnutrition, severe   Malnutrition of moderate degree   Diarrhea   Impaired fasting glucose   Hypokalemia   Iron deficiency anemia  Major depression and initial suicidal ideation.  Patient on fluoxetine.  Awaiting a Geri psych bed.  Unfortunately no bed offers currently. Diarrhea.  Resolved.  Questran stopped.  Continue simethicone and psyllium Fecal incontinence.  Unclear etiology.  Per patient and wife this has been a chronic issue.  He has had multiple colonoscopies without etiology identified.  Started 4 times daily Bentyl on 9/11.  Per patient having good effect.  We will continue for now.  If fecal incontinence persists will pursue CT abdomen pelvis. Left intertrochanteric hip fracture status post left intramedullary nail fixation on 11/25/2020 by Dr. Odis Luster.  Patient walking well with physical therapy.   Cleared for discharge from orthopedic standpoint  paroxysmal atrial fibrillation on metoprolol for rate control and Eliquis for anticoagulation to prevent stroke. Alcohol abuse.  Continue thiamine, folic acid and multivitamin Hypokalemia.  Replace potassium on a daily basis. Iron deficiency anemia.  Stable.  B12 normal range.  Continue oral iron supplementation   DVT prophylaxis: Eliquis Code Status: Full Family Communication: Wife [name] [phone] on 9/10 Disposition Plan: Status is: Inpatient  Remains inpatient appropriate because:Inpatient level of care appropriate due to severity of illness.  Unsafe discharge plan  Dispo: The patient is from: Home              Anticipated d/c is to:  [facility] psych inpatient facility              Patient currently is medically stable to d/c.   Difficult to place patient Yes  Needs Geri psych facility.  Currently no bed offers.     Level of care: Med-Surg  Consultants:  Orthopedics  Procedures:  IM nail for hip fracture 8/23  Antimicrobials: None   Subjective: Seen and examined.  Resting comfortably in bed.  No visible distress.  Endorses improvement in fecal incontinence  Objective: Vitals:   12/14/20 1516 12/14/20 1518 12/14/20 2022 12/15/20 0431  BP: 97/77 100/71 96/68 115/80  Pulse: 66 64 (!) 59 (!) 56  Resp: 16  16 16   Temp: 98.5 F (36.9 C)  98.2 F (36.8 C) 97.7 F (36.5 C)  TempSrc: Oral  Oral Oral  SpO2:  100% 100% 100%  Weight:      Height:        Intake/Output Summary (Last 24  hours) at 12/15/2020 1021 Last data filed at 12/15/2020 1017 Gross per 24 hour  Intake 720 ml  Output 501 ml  Net 219 ml   Filed Weights   11/23/20 1438  Weight: 77.1 kg    Examination:  General exam: No acute distress Respiratory system: Lungs clear.  Normal work of breathing.  Room air Cardiovascular system: S1-S2, RRR, no murmurs, no pedal edema Gastrointestinal system: Abdomen is nondistended, soft and nontender. No  organomegaly or masses felt. Normal bowel sounds heard. Central nervous system: Alert, oriented x2, no focal deficits Extremities: Symmetric 5 x 5 power. Skin: No rashes, lesions or ulcers Psychiatry: Judgement and insight appear normal. Mood & affect appropriate.     Data Reviewed: I have personally reviewed following labs and imaging studies  CBC: Recent Labs  Lab 12/09/20 0510 12/15/20 0623  WBC 5.2 4.9  HGB 10.4* 11.4*  HCT 31.3* 34.6*  MCV 99.7 98.0  PLT 307 238   Basic Metabolic Panel: Recent Labs  Lab 12/10/20 0453  K 3.9  MG 1.7   GFR: Estimated Creatinine Clearance: 103.1 mL/min (A) (by C-G formula based on SCr of 0.54 mg/dL (L)). Liver Function Tests: No results for input(s): AST, ALT, ALKPHOS, BILITOT, PROT, ALBUMIN in the last 168 hours. No results for input(s): LIPASE, AMYLASE in the last 168 hours. No results for input(s): AMMONIA in the last 168 hours. Coagulation Profile: No results for input(s): INR, PROTIME in the last 168 hours. Cardiac Enzymes: No results for input(s): CKTOTAL, CKMB, CKMBINDEX, TROPONINI in the last 168 hours. BNP (last 3 results) No results for input(s): PROBNP in the last 8760 hours. HbA1C: No results for input(s): HGBA1C in the last 72 hours. CBG: Recent Labs  Lab 12/12/20 2140  GLUCAP 89   Lipid Profile: No results for input(s): CHOL, HDL, LDLCALC, TRIG, CHOLHDL, LDLDIRECT in the last 72 hours. Thyroid Function Tests: No results for input(s): TSH, T4TOTAL, FREET4, T3FREE, THYROIDAB in the last 72 hours. Anemia Panel: No results for input(s): VITAMINB12, FOLATE, FERRITIN, TIBC, IRON, RETICCTPCT in the last 72 hours. Sepsis Labs: No results for input(s): PROCALCITON, LATICACIDVEN in the last 168 hours.  Recent Results (from the past 240 hour(s))  Gastrointestinal Panel by PCR , Stool     Status: None   Collection Time: 12/06/20  5:50 PM   Specimen: Stool  Result Value Ref Range Status   Campylobacter species NOT  DETECTED NOT DETECTED Final   Plesimonas shigelloides NOT DETECTED NOT DETECTED Final   Salmonella species NOT DETECTED NOT DETECTED Final   Yersinia enterocolitica NOT DETECTED NOT DETECTED Final   Vibrio species NOT DETECTED NOT DETECTED Final   Vibrio cholerae NOT DETECTED NOT DETECTED Final   Enteroaggregative E coli (EAEC) NOT DETECTED NOT DETECTED Final   Enteropathogenic E coli (EPEC) NOT DETECTED NOT DETECTED Final   Enterotoxigenic E coli (ETEC) NOT DETECTED NOT DETECTED Final   Shiga like toxin producing E coli (STEC) NOT DETECTED NOT DETECTED Final   Shigella/Enteroinvasive E coli (EIEC) NOT DETECTED NOT DETECTED Final   Cryptosporidium NOT DETECTED NOT DETECTED Final   Cyclospora cayetanensis NOT DETECTED NOT DETECTED Final   Entamoeba histolytica NOT DETECTED NOT DETECTED Final   Giardia lamblia NOT DETECTED NOT DETECTED Final   Adenovirus F40/41 NOT DETECTED NOT DETECTED Final   Astrovirus NOT DETECTED NOT DETECTED Final   Norovirus GI/GII NOT DETECTED NOT DETECTED Final   Rotavirus A NOT DETECTED NOT DETECTED Final   Sapovirus (I, II, IV, and V) NOT DETECTED NOT  DETECTED Final    Comment: Performed at [facility], [address], [place], [place] [address]         Radiology Studies: No results found.      Scheduled Meds:  apixaban  5 mg Oral BID   dicyclomine  20 mg Oral TID AC & HS   ferrous sulfate  325 mg Oral Q breakfast   FLUoxetine  20 mg Oral Daily   folic acid  1 mg Oral Daily   metoprolol succinate  25 mg Oral Daily   multivitamin with minerals  1 tablet Oral Daily   potassium chloride  40 mEq Oral Daily   psyllium  1 packet Oral Daily   simethicone  80 mg Oral QID   thiamine  100 mg Oral Daily   Continuous Infusions:   LOS: 21 days    Time spent: 15 minutes    Tresa Moore, MD [facility] Pager [phone]  If 7PM-7AM, please contact night-coverage  12/15/2020, 10:21 AM

## 2020-12-16 DIAGNOSIS — F321 Major depressive disorder, single episode, moderate: Secondary | ICD-10-CM | POA: Diagnosis not present

## 2020-12-16 NOTE — Progress Notes (Signed)
Physical Therapy Treatment Patient Details Name: James Sanford MRN: [id number] DOB: November 30, 1955 Today's Date: 12/16/2020   History of Present Illness Per MD notes, pt is a 65 y.o. male who presented to the ER via EMS for evaluation of a fall with a left intertrochanteric hip fx and is currently s/p IM nail WBAT LLE. PMH includes alcohol abuse, diabetes mellitus, hypertension, sleep apnea, paroxysmal atrial fibrillation on anticoagulation. MD assessment includes severe depression with suicidal ideation, UTI, alcohol abuse, and displaced, impacted intertrochanteric fracture of the left femur.    PT Comments    Pt received sitting in recliner, agreeable to therapy. He reported needing to be cleaned up after having a BM. PT assisted with pericare as pt stood SUP with RW. SPC was trialed in today's session. He required CGA for occasional steadying assist. Continuing to rec RW upon d/c as pt will need further practice with cane prior to d/c. Pt reported fatigue after toileting. Would benefit from skilled PT to address above deficits and promote optimal return to PLOF.    Recommendations for follow up therapy are one component of a multi-disciplinary discharge planning process, led by the attending physician.  Recommendations may be updated based on patient status, additional functional criteria and insurance authorization.  Follow Up Recommendations  Home health PT     Equipment Recommendations  Rolling walker with 5" wheels    Recommendations for Other Services       Precautions / Restrictions Precautions Precautions: Fall Restrictions Weight Bearing Restrictions: No LLE Weight Bearing: Weight bearing as tolerated     Mobility  Bed Mobility               General bed mobility comments: NT, pt in recliner    Transfers Overall transfer level: Needs assistance Equipment used: Rolling walker (2 wheeled);Straight cane Transfers: Sit to/from Stand Sit to Stand:  Supervision         General transfer comment: 1 rep with RW, 3 reps with SPC; SUP for safety good concentric and eccentric control  Ambulation/Gait Ambulation/Gait assistance: Min guard Gait Distance (Feet): 200 Feet Assistive device: Straight cane Gait Pattern/deviations: Decreased step length - left;Trunk flexed;Decreased step length - right;Step-through pattern Gait velocity: decreased   General Gait Details: First trial using SPC today. CGA for occasional steadying.   Stairs             Wheelchair Mobility    Modified Rankin (Stroke Patients Only)       Balance Overall balance assessment: Needs assistance Sitting-balance support: No upper extremity supported;Feet supported Sitting balance-Leahy Scale: Normal     Standing balance support: Single extremity supported Standing balance-Leahy Scale: Good Standing balance comment: Good static and dynamic standing balance                            Cognition Arousal/Alertness: Awake/alert Behavior During Therapy: WFL for tasks assessed/performed Overall Cognitive Status: Within Functional Limits for tasks assessed                                        Exercises Other Exercises Other Exercises: Standing balance with RW as PT provided pericare. Pt toileted at end of session with SUP and performed all pericare and hygiene.    General Comments        Pertinent Vitals/Pain Pain Assessment: No/denies pain    Home Living  Prior Function            PT Goals (current goals can now be found in the care plan section) Acute Rehab PT Goals Patient Stated Goal: I want to get better    Frequency    Min 2X/week      PT Plan      Co-evaluation              AM-PAC PT "6 Clicks" Mobility   Outcome Measure  Help needed turning from your back to your side while in a flat bed without using bedrails?: None Help needed moving from lying on your  back to sitting on the side of a flat bed without using bedrails?: None Help needed moving to and from a bed to a chair (including a wheelchair)?: A Little Help needed standing up from a chair using your arms (e.g., wheelchair or bedside chair)?: A Little Help needed to walk in hospital room?: A Little Help needed climbing 3-5 steps with a railing? : A Little 6 Click Score: 20    End of Session Equipment Utilized During Treatment: Gait belt Activity Tolerance: Patient tolerated treatment well Patient left: in chair;with chair alarm set;with call bell/phone within reach Nurse Communication: Mobility status PT Visit Diagnosis: Unsteadiness on feet (R26.81);Other abnormalities of gait and mobility (R26.89);Muscle weakness (generalized) (M62.81);History of falling (Z91.81)     Time: 4268-3419 PT Time Calculation (min) (ACUTE ONLY): 16 min  Charges:  $Therapeutic Activity: 8-22 mins                     Basilia Jumbo PT, DPT 12/16/20 4:51 PM [phone]    Lavenia Atlas 12/16/2020, 4:48 PM

## 2020-12-16 NOTE — Consult Note (Signed)
[facility] Face-to-Face Psychiatry Consult   Reason for Consult: Consult follow-up for this 65 year old man in the hospital having recovery from his hip fracture and with depression and alcohol abuse Referring Physician:  Georgeann Oppenheim Patient Identification: James Sanford MRN:  [id number] Principal Diagnosis: Moderate major depression, single episode (HCC) Diagnosis:  Principal Problem:   Moderate major depression, single episode (HCC) Active Problems:   Alcohol abuse   Diabetes mellitus type 2, uncomplicated (HCC)   AF (paroxysmal atrial fibrillation) (HCC)   Essential hypertension   Closed nondisplaced intertrochanteric fracture of left femur (HCC)   Acute lower UTI   Protein-calorie malnutrition, severe   Malnutrition of moderate degree   Diarrhea   Impaired fasting glucose   Hypokalemia   Iron deficiency anemia   Total Time spent with patient: 45 minutes  Subjective:   James Sanford is a 65 y.o. male patient admitted with "I am fine".  HPI: Patient seen.  Chart reviewed.  Patient had been seen by me several times earlier in his hospitalization.  64 year old man who presented to the hospital with lethargy dysphoria reports of recent heavy drinking and a fall.  Was admitted to the medical service with a hip fracture which has now been repaired.  Patient has been cooperating with PT and has regained much ability to ambulate.  Wife remains concerned about high risk of suicide at home given that he had made suicidal statements previously and that his insight continues to be poor.  Patient has difficulty acknowledging her concerns or the behaviors that were indicative of depression.  Past Psychiatric History: No history of past participation with any treatment  Risk to Self:   Risk to Others:   Prior Inpatient Therapy:   Prior Outpatient Therapy:    Past Medical History:  Past Medical History:  Diagnosis Date   Arthritis    Diabetes (HCC)    Hyperlipemia     Hypertension    Sleep apnea     Past Surgical History:  Procedure Laterality Date   COLONOSCOPY WITH PROPOFOL N/A 01/28/2020   Procedure: COLONOSCOPY WITH PROPOFOL;  Surgeon: Toney Reil, MD;  Location: [facility] ENDOSCOPY;  Service: Gastroenterology;  Laterality: N/A;   COLONOSCOPY WITH PROPOFOL N/A 01/28/2020   Procedure: COLONOSCOPY WITH PROPOFOL;  Surgeon: Toney Reil, MD;  Location: [facility] ENDOSCOPY;  Service: Gastroenterology;  Laterality: N/A;   ESOPHAGOGASTRODUODENOSCOPY (EGD) WITH PROPOFOL N/A 01/28/2020   Procedure: ESOPHAGOGASTRODUODENOSCOPY (EGD) WITH PROPOFOL;  Surgeon: Toney Reil, MD;  Location: [facility] ENDOSCOPY;  Service: Gastroenterology;  Laterality: N/A;   GASTRIC BYPASS     HERNIA REPAIR     INTRAMEDULLARY (IM) NAIL INTERTROCHANTERIC Left 11/25/2020   Procedure: INTRAMEDULLARY (IM) NAIL INTERTROCHANTRIC;  Surgeon: Lyndle Herrlich, MD;  Location: [facility] ORS;  Service: Orthopedics;  Laterality: Left;   JOINT REPLACEMENT Bilateral    Knee surgery   OPEN REDUCTION INTERNAL FIXATION (ORIF) DISTAL RADIAL FRACTURE Right 10/30/2017   Procedure: OPEN REDUCTION INTERNAL FIXATION (ORIF) DISTAL RADIAL FRACTURE;  Surgeon: Garnette Gunner, MD;  Location: [facility] ORS;  Service: Orthopedics;  Laterality: Right;   ORIF FEMUR FRACTURE Right 01/29/2020   Procedure: OPEN REDUCTION INTERNAL FIXATION (ORIF) DISTAL FEMUR FRACTURE;  Surgeon: Christena Flake, MD;  Location: [facility] ORS;  Service: Orthopedics;  Laterality: Right;   Family History:  Family History  Problem Relation Age of Onset   Stroke Mother    Suicidality Father    Family Psychiatric  History: Positive for suicidality in the father Social History:  Social History  Substance and Sexual Activity  Alcohol Use Yes   Comment: beer occasionally     Social History   Substance and Sexual Activity  Drug Use Not Currently    Social History   Socioeconomic History   Marital status: Married    Spouse name: Not on  file   Number of children: Not on file   Years of education: Not on file   Highest education level: Not on file  Occupational History   Not on file  Tobacco Use   Smoking status: Never   Smokeless tobacco: Former    Types: Chew   Tobacco comments:    quir about 4 years ago  Vaping Use   Vaping Use: Never used  Substance and Sexual Activity   Alcohol use: Yes    Comment: beer occasionally   Drug use: Not Currently   Sexual activity: Not on file  Other Topics Concern   Not on file  Social History Narrative   Lives at home with his wife. Independent baseline   Social Determinants of Corporate investment banker Strain: Not on file  Food Insecurity: Not on file  Transportation Needs: Not on file  Physical Activity: Not on file  Stress: Not on file  Social Connections: Not on file   Additional Social History:    Allergies:   Allergies  Allergen Reactions   Penicillins Diarrhea    TOLERATED CEFAZOLIN 11/25/20 Unknown childhood reaction    Labs:  Results for orders placed or performed during the hospital encounter of 11/23/20 (from the past 48 hour(s))  CBC     Status: Abnormal   Collection Time: 12/15/20  6:23 AM  Result Value Ref Range   WBC 4.9 4.0 - 10.5 K/uL   RBC 3.53 (L) 4.22 - 5.81 MIL/uL   Hemoglobin 11.4 (L) 13.0 - 17.0 g/dL   HCT 36.1 (L) 44.3 - 15.4 %   MCV 98.0 80.0 - 100.0 fL   MCH 32.3 26.0 - 34.0 pg   MCHC 32.9 30.0 - 36.0 g/dL   RDW 00.8 67.6 - 19.5 %   Platelets 238 150 - 400 K/uL   nRBC 0.0 0.0 - 0.2 %    Comment: Performed at [facility], [address] [place], [place] [address]  Creatinine, serum     Status: Abnormal   Collection Time: 12/15/20  6:23 AM  Result Value Ref Range   Creatinine, Ser 0.55 (L) 0.61 - 1.24 mg/dL   GFR, Estimated >71 >24 mL/min    Comment: (NOTE) Calculated using the CKD-EPI Creatinine Equation (2021) Performed at [facility], [address] [place], [place] [address]     Current  Facility-Administered Medications  Medication Dose Route Frequency Provider Last Rate Last Admin   apixaban (ELIQUIS) tablet 5 mg  5 mg Oral BID Charise Killian, MD   5 mg at 12/16/20 1000   dicyclomine (BENTYL) tablet 20 mg  20 mg Oral TID AC & HS Sreenath, Sudheer B, MD   20 mg at 12/16/20 1109   diphenoxylate-atropine (LOMOTIL) 2.5-0.025 MG per tablet 1 tablet  1 tablet Oral QID PRN Alford Highland, MD       ferrous sulfate tablet 325 mg  325 mg Oral Q breakfast Alford Highland, MD   325 mg at 12/16/20 0823   FLUoxetine (PROZAC) capsule 20 mg  20 mg Oral Daily Lyndle Herrlich, MD   20 mg at 12/16/20 0959   folic acid (FOLVITE) tablet 1 mg  1 mg Oral  Daily Lyndle Herrlich, MD   1 mg at 12/16/20 1000   methocarbamol (ROBAXIN) tablet 500 mg  500 mg Oral Q6H PRN Lyndle Herrlich, MD   500 mg at 12/05/20 2114   metoprolol succinate (TOPROL-XL) 24 hr tablet 25 mg  25 mg Oral Daily Lyndle Herrlich, MD   25 mg at 12/16/20 9381   multivitamin with minerals tablet 1 tablet  1 tablet Oral Daily Lyndle Herrlich, MD   1 tablet at 12/16/20 1000   ondansetron (ZOFRAN) tablet 4 mg  4 mg Oral Q6H PRN Lyndle Herrlich, MD       Or   ondansetron [facility]) injection 4 mg  4 mg Intravenous Q6H PRN Lyndle Herrlich, MD       potassium chloride SA (KLOR-CON) CR tablet 40 mEq  40 mEq Oral Daily Alford Highland, MD   40 mEq at 12/16/20 0959   simethicone (MYLICON) chewable tablet 80 mg  80 mg Oral QID Lolita Patella B, MD   80 mg at 12/16/20 1335   thiamine tablet 100 mg  100 mg Oral Daily Lyndle Herrlich, MD   100 mg at 12/16/20 1000    Musculoskeletal: Strength & Muscle Tone: within normal limits Gait & Station: normal Patient leans: N/A            Psychiatric Specialty Exam:  Presentation  General Appearance:  No data recorded Eye Contact: No data recorded Speech: No data recorded Speech Volume: No data recorded Handedness: No data recorded  Mood and Affect  Mood: No data  recorded Affect: No data recorded  Thought Process  Thought Processes: No data recorded Descriptions of Associations:No data recorded Orientation:No data recorded Thought Content:No data recorded History of Schizophrenia/Schizoaffective disorder:No  Duration of Psychotic Symptoms:No data recorded Hallucinations:No data recorded Ideas of Reference:No data recorded Suicidal Thoughts:No data recorded Homicidal Thoughts:No data recorded  Sensorium  Memory: No data recorded Judgment: No data recorded Insight: No data recorded  Executive Functions  Concentration: No data recorded Attention Span: No data recorded Recall: No data recorded Fund of Knowledge: No data recorded Language: No data recorded  Psychomotor Activity  Psychomotor Activity: No data recorded  Assets  Assets: No data recorded  Sleep  Sleep: No data recorded  Physical Exam: Physical Exam Vitals and nursing note reviewed.  Constitutional:      Appearance: Normal appearance.  HENT:     Head: Normocephalic and atraumatic.     Mouth/Throat:     Pharynx: Oropharynx is clear.  Eyes:     Pupils: Pupils are equal, round, and reactive to light.  Cardiovascular:     Rate and Rhythm: Normal rate and regular rhythm.  Pulmonary:     Effort: Pulmonary effort is normal.     Breath sounds: Normal breath sounds.  Abdominal:     General: Abdomen is flat.     Palpations: Abdomen is soft.  Musculoskeletal:        General: Normal range of motion.  Skin:    General: Skin is warm and dry.  Neurological:     General: No focal deficit present.     Mental Status: He is alert. Mental status is at baseline.  Psychiatric:        Attention and Perception: He is inattentive.        Mood and Affect: Mood normal. Affect is blunt.        Speech: Speech is delayed.        Behavior: Behavior is slowed.  Thought Content: Thought content normal.        Cognition and Memory: Cognition normal.   Review of  Systems  Constitutional: Negative.   HENT: Negative.    Eyes: Negative.   Respiratory: Negative.    Cardiovascular: Negative.   Gastrointestinal: Negative.   Musculoskeletal: Negative.   Skin: Negative.   Neurological: Negative.   Psychiatric/Behavioral:  Negative for depression, hallucinations, substance abuse and suicidal ideas. The patient is not nervous/anxious and does not have insomnia.   Blood pressure 111/78, pulse (!) 53, temperature 97.9 F (36.6 C), resp. rate 20, height 6' (1.829 m), weight 77.1 kg, SpO2 100 %. Body mass index is 23.06 kg/m.  Treatment Plan Summary: Plan patient has been referred to geriatric psychiatry wards but had been difficult to place due to medical problems.  Medical problems much improved.  Now able to walk without difficulty.  Wife continues to be extraordinarily concerned and not feel safe with him coming back home.  Recommend continued search for geriatric psychiatry beds.  Discussed options with treatment team.  Continue current fluoxetine for depression.  Disposition: Recommend psychiatric Inpatient admission when medically cleared.  Mordecai Rasmussen, MD 12/16/2020 3:59 PM

## 2020-12-16 NOTE — Progress Notes (Signed)
Nutrition Follow-up  DOCUMENTATION CODES:  Non-severe (moderate) malnutrition in context of social or environmental circumstances  INTERVENTION:  Continue current diet as ordered, encourage PO intake Continue MVI, thiamine, and folic acid daily for hx of EtOH abuse Continue Magic cup TID with meals, each supplement provides 290 kcal and 9 grams of protein Request new measured weight Screen for folate, vitamin B6, vitamin D and zinc deficiencies  NUTRITION DIAGNOSIS:  Moderate Malnutrition (in the context of social/environmental circumstances) related to  (excessive EtOH intake and hx of bariatric surgery) as evidenced by mild fat depletion, moderate fat depletion, mild muscle depletion, moderate muscle depletion, severe muscle depletion.  GOAL:  Patient will meet greater than or equal to 90% of their needs  MONITOR:  PO intake, Labs, Weight trends, Skin, Supplement acceptance  REASON FOR ASSESSMENT:  Consult Hip fracture protocol  ASSESSMENT:  65 yo male with a PMH of alcohol abuse, DM, HTN, HLD, hx of gastric sleeve, and atrial fibrillation presented to ED with pain after several falls at home while intoxicated. Non-ambulatory at baseline but family reports he attempts to walk when drinking.  In ED, pt initially placed in IVC status due to wife's report of pt making suicidal ideations while intoxicated and showing symptoms of depression. Reported pt has become more violent at home and has a family hx of depression. However, psychiatry discontinued. 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    Imaging in ED did show a displaced, impacted intertrochanteric fracture of the left femur. Taken for surgical repair 8/23.  Per MD, pt medically ready for dc, but case management working on placement for treatment prior to returning home.  Pt resting in bedside chair at the time of assessment states that appetite  remains stable and he has good intake. Discussed noted hx of bariatric surgery. 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 Pt clarified that he did undergo a gastric sleeve and that he was taking several vitamins at home over the counter. Pt reports taking a b-complex, vitamin c, and vitamin d. No labs have been drawn to assess these labs in several years. Discussed with MD, will check some nutrient levels to determine if they fall within satisfactory range or if supplementation is needed.   Pt still does not have a new weight recorded since 8/21, will request again to assess for loss.  Average Meal Intake: 8/23-8/29: 79% intake x 15 recorded meals (40-100%) 8/30-9/6: 91% intake x 8 recorded meals (50-100%) 9/7-9/13: 73% intake x 13 recorded meals (50-100%)  Nutritionally Relevant Medications: Scheduled Meds:  dicyclomine  20 mg Oral TID AC & HS   ferrous sulfate  325 mg Oral Q breakfast   folic acid  1 mg Oral Daily   multivitamin with minerals  1 tablet Oral Daily   potassium chloride  40 mEq Oral Daily   psyllium  1 packet Oral Daily   simethicone  80 mg Oral QID   thiamine  100 mg Oral Daily   PRN Meds: ondansetron   Labs Reviewed: HgbA1c 5.3% (8/21)  NUTRITION - FOCUSED PHYSICAL EXAM: Flowsheet Row Most Recent Value  Orbital Region Moderate depletion  Upper Arm Region Mild depletion  Thoracic and Lumbar Region No depletion  Buccal Region Mild depletion  Temple Region Moderate depletion  Clavicle Bone Region Severe depletion  Clavicle and Acromion Bone Region Severe depletion  Scapular Bone Region Unable to assess  Dorsal  Hand Mild depletion  Patellar Region Mild depletion  Anterior Thigh Region Mild depletion  Posterior Calf Region Mild depletion  Edema (RD Assessment) None  Hair Reviewed  Eyes Reviewed  Mouth Reviewed  Skin Reviewed  Nails Reviewed   Diet Order:   Diet Order             Diet regular Room service  appropriate? Yes; Fluid consistency: Thin  Diet effective now                   EDUCATION NEEDS:  Education needs have been addressed  Skin:  Skin Assessment: Reviewed RN Assessment (surgical incisions to the left hip/leg, bruising to bilateral arms)  Last BM:  9/12 - type 6  Height:  Ht Readings from Last 1 Encounters:  11/23/20 6' (1.829 m)   Weight:  Wt Readings from Last 1 Encounters:  11/23/20 77.1 kg    Ideal Body Weight:  80.9 kg  BMI:  Body mass index is 23.06 kg/m.  Estimated Nutritional Needs:  Kcal:  2200-2400 Protein:  110-120 g/d Fluid:  >2.3 L/d   Greig Castilla, RD, LDN Clinical Dietitian Pager on Amion

## 2020-12-16 NOTE — TOC Progression Note (Addendum)
Transition of Care [facility]) - Progression Note    Patient Details  Name: James Sanford MRN: [id number] Date of Birth: 1955-05-16  [facility] CM/SW Contact  Caryn Section, RN Phone Number: 12/16/2020, 1:39 PM  Clinical Narrative:   No psychiatric beds at this time. Will re-send information.  TOC to follow  Addendum:  Facilities require an updated psychiatiric assessment prior to consideration, care team aware.         Expected Discharge Plan and Services                                                 Social Determinants of Health (SDOH) Interventions    Readmission Risk Interventions No flowsheet data found.

## 2020-12-16 NOTE — Progress Notes (Signed)
PROGRESS NOTE    James Sanford  EXH:[id number] DOB: [birth year] DOA: 11/23/2020 PCP: Jerl Mina, MD    Brief Narrative:  65 year old man with past medical history of hypertension hyperlipidemia atrial fibrillation and alcohol abuse presents to the hospital after a fall and found to have a left hip fracture.  On 11/25/2020 Dr. Odis Luster did a left intramedullary nail intertrochanteric procedure for left hip fracture.  The patient was seen by psychiatry and they recommended a Geri psych bed for moderate major depression.    Patient was having diarrhea.  This is resolved after administration of cholestyramine.  Stool GI PCR panel negative.  No/low suspicion for C. difficile.  Main ongoing issue has been fecal incontinence.  This is a chronic issue for patient.  Multiple colonoscopies and diagnostic work-up in the past have been inconclusive.  Started Bentyl on 9/11.  Seemingly good result.   Assessment & Plan:   Principal Problem:   Moderate major depression, single episode (HCC) Active Problems:   Alcohol abuse   Diabetes mellitus type 2, uncomplicated (HCC)   AF (paroxysmal atrial fibrillation) (HCC)   Essential hypertension   Closed nondisplaced intertrochanteric fracture of left femur (HCC)   Acute lower UTI   Protein-calorie malnutrition, severe   Malnutrition of moderate degree   Diarrhea   Impaired fasting glucose   Hypokalemia   Iron deficiency anemia  Major depression and initial suicidal ideation.  Patient on fluoxetine.  Awaiting a Geri psych bed.  Unfortunately no bed offers currently. Diarrhea.  Resolved.  Questran stopped.  Discontinue psyllium due to excess gas.  Continue simethicone Fecal incontinence.  Unclear etiology.  Per patient and wife this has been a chronic issue.  He has had multiple colonoscopies without etiology identified.  Started 4 times daily Bentyl on 9/11.  Per patient having good effect.  We will continue for now.  Defer CT abdomen pelvis for  now but can consider if fecal incontinence persists Left intertrochanteric hip fracture status post left intramedullary nail fixation on 11/25/2020 by Dr. Odis Luster.  Patient walking well with physical therapy.  Cleared for discharge from orthopedic standpoint  paroxysmal atrial fibrillation on metoprolol for rate control and Eliquis for anticoagulation to prevent stroke. Alcohol abuse.  Continue thiamine, folic acid and multivitamin Hypokalemia.  Replace potassium on a daily basis. Iron deficiency anemia.  Stable.  B12 normal range.  Continue oral iron supplementation   DVT prophylaxis: Eliquis Code Status: Full Family Communication: Wife [name] [phone] on 9/10; left VM on 9/13 Disposition Plan: Status is: Inpatient  Remains inpatient appropriate because:Inpatient level of care appropriate due to severity of illness.  Unsafe discharge plan  Dispo: The patient is from: Home              Anticipated d/c is to:  [facility] psych inpatient facility              Patient currently is medically stable to d/c.   Difficult to place patient Yes  Needs Geri psych facility.  Currently no bed offers.     Level of care: Med-Surg  Consultants:  Orthopedics  Procedures:  IM nail for hip fracture 8/23  Antimicrobials: None   Subjective: And examined.  Resting comfortably in bed.  No visible distress.  Does endorse gas but states improvement in fecal incontinence  Objective: Vitals:   12/15/20 0431 12/15/20 1935 12/16/20 0221 12/16/20 0857  BP: 115/80 99/74 104/76 111/78  Pulse: (!) 56 (!) 53 (!) 54 (!) 53  Resp: 16 18 20  20  Temp: 97.7 F (36.5 C) 97.9 F (36.6 C) 98.1 F (36.7 C) 97.9 F (36.6 C)  TempSrc: Oral     SpO2: 100% 100% 100% 100%  Weight:      Height:        Intake/Output Summary (Last 24 hours) at 12/16/2020 1255 Last data filed at 12/16/2020 1007 Gross per 24 hour  Intake 0 ml  Output --  Net 0 ml   Filed Weights   11/23/20 1438  Weight: 77.1 kg     Examination:  General exam: No acute distress Respiratory system: Lungs clear.  Normal work of breathing.  Room air Cardiovascular system: S1-S2, RRR, no murmurs, no pedal edema Gastrointestinal system: Abdomen is nondistended, soft and nontender. No organomegaly or masses felt. Normal bowel sounds heard. Central nervous system: Alert, oriented x2, no focal deficits Extremities: Symmetric 5 x 5 power. Skin: No rashes, lesions or ulcers Psychiatry: Judgement and insight appear normal. Mood & affect appropriate.     Data Reviewed: I have personally reviewed following labs and imaging studies  CBC: Recent Labs  Lab 12/15/20 0623  WBC 4.9  HGB 11.4*  HCT 34.6*  MCV 98.0  PLT 238   Basic Metabolic Panel: Recent Labs  Lab 12/10/20 0453 12/15/20 0623  K 3.9  --   CREATININE  --  0.55*  MG 1.7  --    GFR: Estimated Creatinine Clearance: 103.1 mL/min (A) (by C-G formula based on SCr of 0.55 mg/dL (L)). Liver Function Tests: No results for input(s): AST, ALT, ALKPHOS, BILITOT, PROT, ALBUMIN in the last 168 hours. No results for input(s): LIPASE, AMYLASE in the last 168 hours. No results for input(s): AMMONIA in the last 168 hours. Coagulation Profile: No results for input(s): INR, PROTIME in the last 168 hours. Cardiac Enzymes: No results for input(s): CKTOTAL, CKMB, CKMBINDEX, TROPONINI in the last 168 hours. BNP (last 3 results) No results for input(s): PROBNP in the last 8760 hours. HbA1C: No results for input(s): HGBA1C in the last 72 hours. CBG: Recent Labs  Lab 12/12/20 2140  GLUCAP 89   Lipid Profile: No results for input(s): CHOL, HDL, LDLCALC, TRIG, CHOLHDL, LDLDIRECT in the last 72 hours. Thyroid Function Tests: No results for input(s): TSH, T4TOTAL, FREET4, T3FREE, THYROIDAB in the last 72 hours. Anemia Panel: No results for input(s): VITAMINB12, FOLATE, FERRITIN, TIBC, IRON, RETICCTPCT in the last 72 hours. Sepsis Labs: No results for input(s):  PROCALCITON, LATICACIDVEN in the last 168 hours.  Recent Results (from the past 240 hour(s))  Gastrointestinal Panel by PCR , Stool     Status: None   Collection Time: 12/06/20  5:50 PM   Specimen: Stool  Result Value Ref Range Status   Campylobacter species NOT DETECTED NOT DETECTED Final   Plesimonas shigelloides NOT DETECTED NOT DETECTED Final   Salmonella species NOT DETECTED NOT DETECTED Final   Yersinia enterocolitica NOT DETECTED NOT DETECTED Final   Vibrio species NOT DETECTED NOT DETECTED Final   Vibrio cholerae NOT DETECTED NOT DETECTED Final   Enteroaggregative E coli (EAEC) NOT DETECTED NOT DETECTED Final   Enteropathogenic E coli (EPEC) NOT DETECTED NOT DETECTED Final   Enterotoxigenic E coli (ETEC) NOT DETECTED NOT DETECTED Final   Shiga like toxin producing E coli (STEC) NOT DETECTED NOT DETECTED Final   Shigella/Enteroinvasive E coli (EIEC) NOT DETECTED NOT DETECTED Final   Cryptosporidium NOT DETECTED NOT DETECTED Final   Cyclospora cayetanensis NOT DETECTED NOT DETECTED Final   Entamoeba histolytica NOT DETECTED NOT DETECTED  Final   Giardia lamblia NOT DETECTED NOT DETECTED Final   Adenovirus F40/41 NOT DETECTED NOT DETECTED Final   Astrovirus NOT DETECTED NOT DETECTED Final   Norovirus GI/GII NOT DETECTED NOT DETECTED Final   Rotavirus A NOT DETECTED NOT DETECTED Final   Sapovirus (I, II, IV, and V) NOT DETECTED NOT DETECTED Final    Comment: Performed at [facility], [address], [place], [place] [address]         Radiology Studies: No results found.      Scheduled Meds:  apixaban  5 mg Oral BID   dicyclomine  20 mg Oral TID AC & HS   ferrous sulfate  325 mg Oral Q breakfast   FLUoxetine  20 mg Oral Daily   folic acid  1 mg Oral Daily   metoprolol succinate  25 mg Oral Daily   multivitamin with minerals  1 tablet Oral Daily   potassium chloride  40 mEq Oral Daily   simethicone  80 mg Oral QID   thiamine  100 mg Oral Daily    Continuous Infusions:   LOS: 22 days    Time spent: 15 minutes    Tresa Moore, MD [facility] Pager [phone]  If 7PM-7AM, please contact night-coverage  12/16/2020, 12:55 PM

## 2020-12-16 NOTE — Plan of Care (Signed)
  Problem: Education: Goal: Verbalization of understanding the information provided (i.e., activity precautions, restrictions, etc) will improve Outcome: Progressing Goal: Individualized Educational Video(s) Outcome: Progressing   Problem: Activity: Goal: Ability to ambulate and perform ADLs will improve Outcome: Progressing   Problem: Clinical Measurements: Goal: Postoperative complications will be avoided or minimized Outcome: Progressing   Problem: Self-Concept: Goal: Ability to maintain and perform role responsibilities to the fullest extent possible will improve Outcome: Progressing   Problem: Education: Goal: Knowledge of General Education information will improve Description: Including pain rating scale, medication(s)/side effects and non-pharmacologic comfort measures Outcome: Progressing   Problem: Health Behavior/Discharge Planning: Goal: Ability to manage health-related needs will improve Outcome: Progressing   Problem: Clinical Measurements: Goal: Diagnostic test results will improve Outcome: Progressing Goal: Respiratory complications will improve Outcome: Progressing Goal: Cardiovascular complication will be avoided Outcome: Progressing   Problem: Activity: Goal: Risk for activity intolerance will decrease Outcome: Progressing   Problem: Nutrition: Goal: Adequate nutrition will be maintained Outcome: Progressing   Problem: Coping: Goal: Level of anxiety will decrease Outcome: Progressing   Problem: Elimination: Goal: Will not experience complications related to urinary retention Outcome: Progressing   Problem: Pain Managment: Goal: General experience of comfort will improve Outcome: Progressing   Problem: Safety: Goal: Ability to remain free from injury will improve Outcome: Progressing   Problem: Skin Integrity: Goal: Risk for impaired skin integrity will decrease Outcome: Progressing   Problem: Education: Goal: Knowledge of General  Education information will improve Description: Including pain rating scale, medication(s)/side effects and non-pharmacologic comfort measures Outcome: Progressing   Problem: Health Behavior/Discharge Planning: Goal: Ability to manage health-related needs will improve Outcome: Progressing   Problem: Clinical Measurements: Goal: Ability to maintain clinical measurements within normal limits will improve Outcome: Progressing Goal: Will remain free from infection Outcome: Progressing Goal: Diagnostic test results will improve Outcome: Progressing Goal: Respiratory complications will improve Outcome: Progressing Goal: Cardiovascular complication will be avoided Outcome: Progressing   Problem: Activity: Goal: Risk for activity intolerance will decrease Outcome: Progressing   Problem: Nutrition: Goal: Adequate nutrition will be maintained Outcome: Progressing   Problem: Coping: Goal: Level of anxiety will decrease Outcome: Progressing   Problem: Elimination: Goal: Will not experience complications related to bowel motility Outcome: Progressing Goal: Will not experience complications related to urinary retention Outcome: Progressing   Problem: Pain Managment: Goal: General experience of comfort will improve Outcome: Progressing   Problem: Safety: Goal: Ability to remain free from injury will improve Outcome: Progressing   Problem: Skin Integrity: Goal: Risk for impaired skin integrity will decrease Outcome: Progressing   

## 2020-12-17 ENCOUNTER — Inpatient Hospital Stay: Payer: BC Managed Care – PPO

## 2020-12-17 DIAGNOSIS — F321 Major depressive disorder, single episode, moderate: Secondary | ICD-10-CM | POA: Diagnosis not present

## 2020-12-17 LAB — FOLATE: Folate: 6.3 ng/mL (ref >5.9)

## 2020-12-17 LAB — VITAMIN D 25 HYDROXY (VIT D DEFICIENCY, FRACTURES): Vit D, 25-Hydroxy: 13.21 ng/mL — ABNORMAL LOW (ref 30–100)

## 2020-12-17 IMAGING — CR DG HIP (WITH OR WITHOUT PELVIS) 2-3V*L*
3 series · 3 of 3 positions shown · non-contrast
Comparison: [DATE] [DATE], [DATE].  [DATE] [DATE], [DATE].

CLINICAL DATA: Left hip fracture.

EXAM:
DG HIP (WITH OR WITHOUT PELVIS) 2-3V LEFT

[pelvis ap]
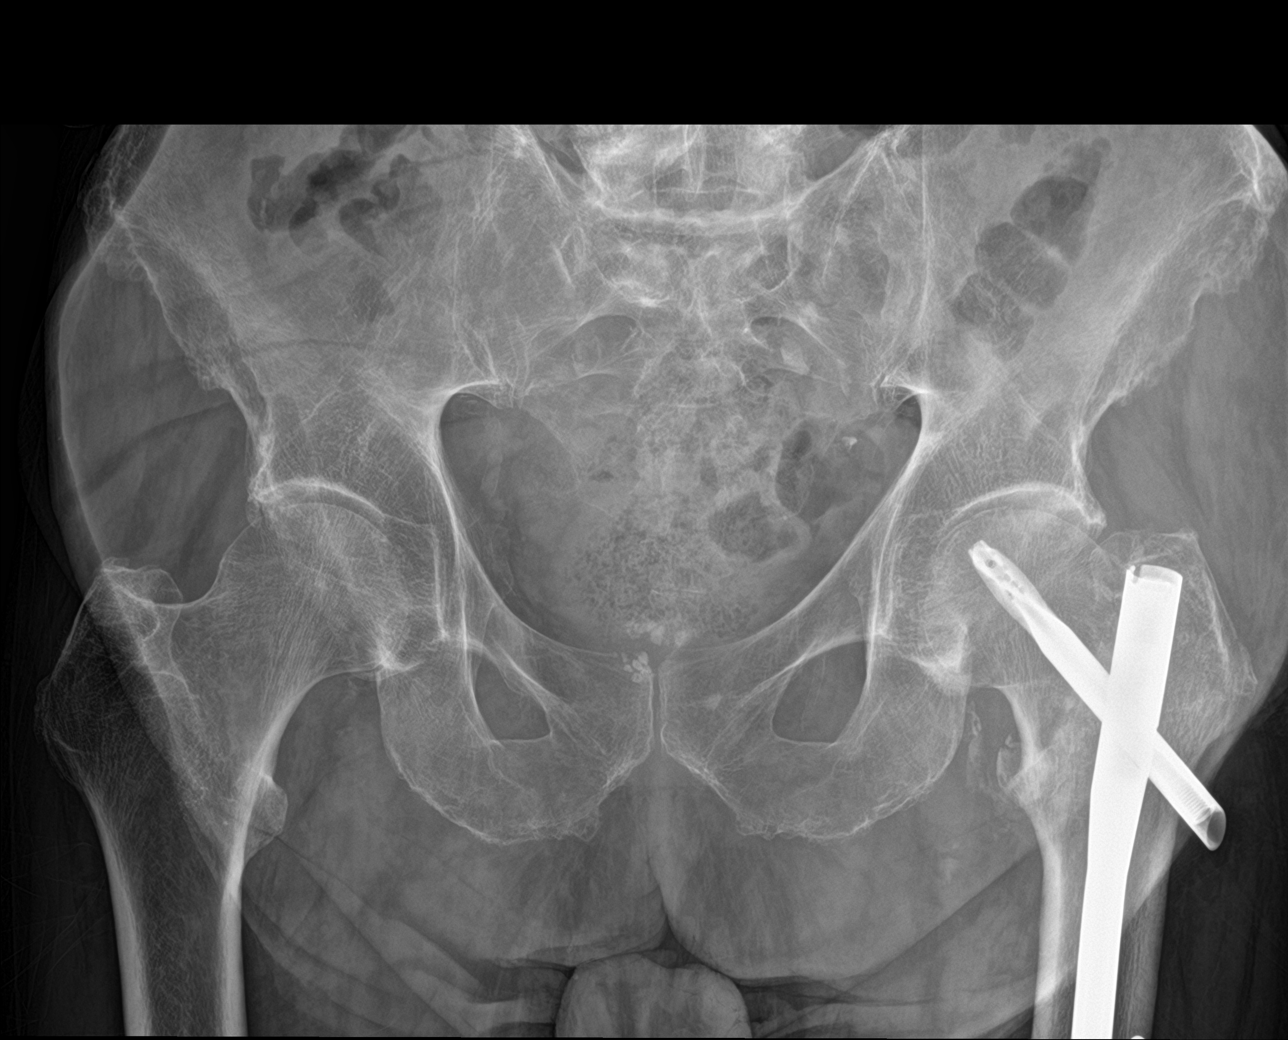

[hip ap]
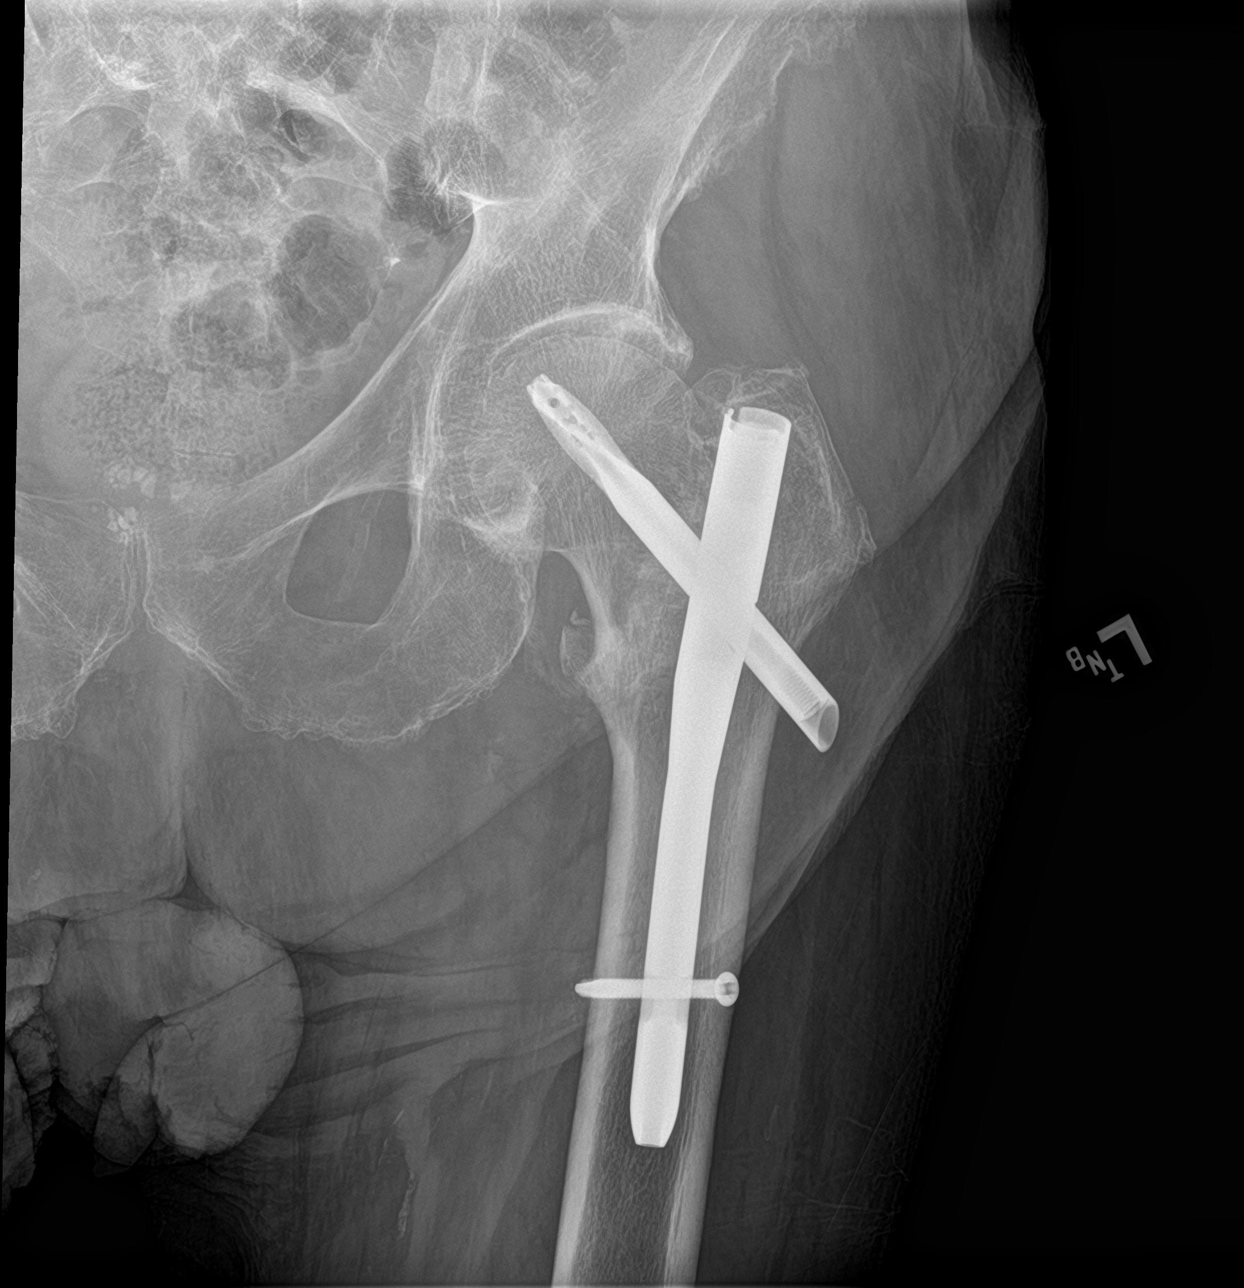

[hip lat]
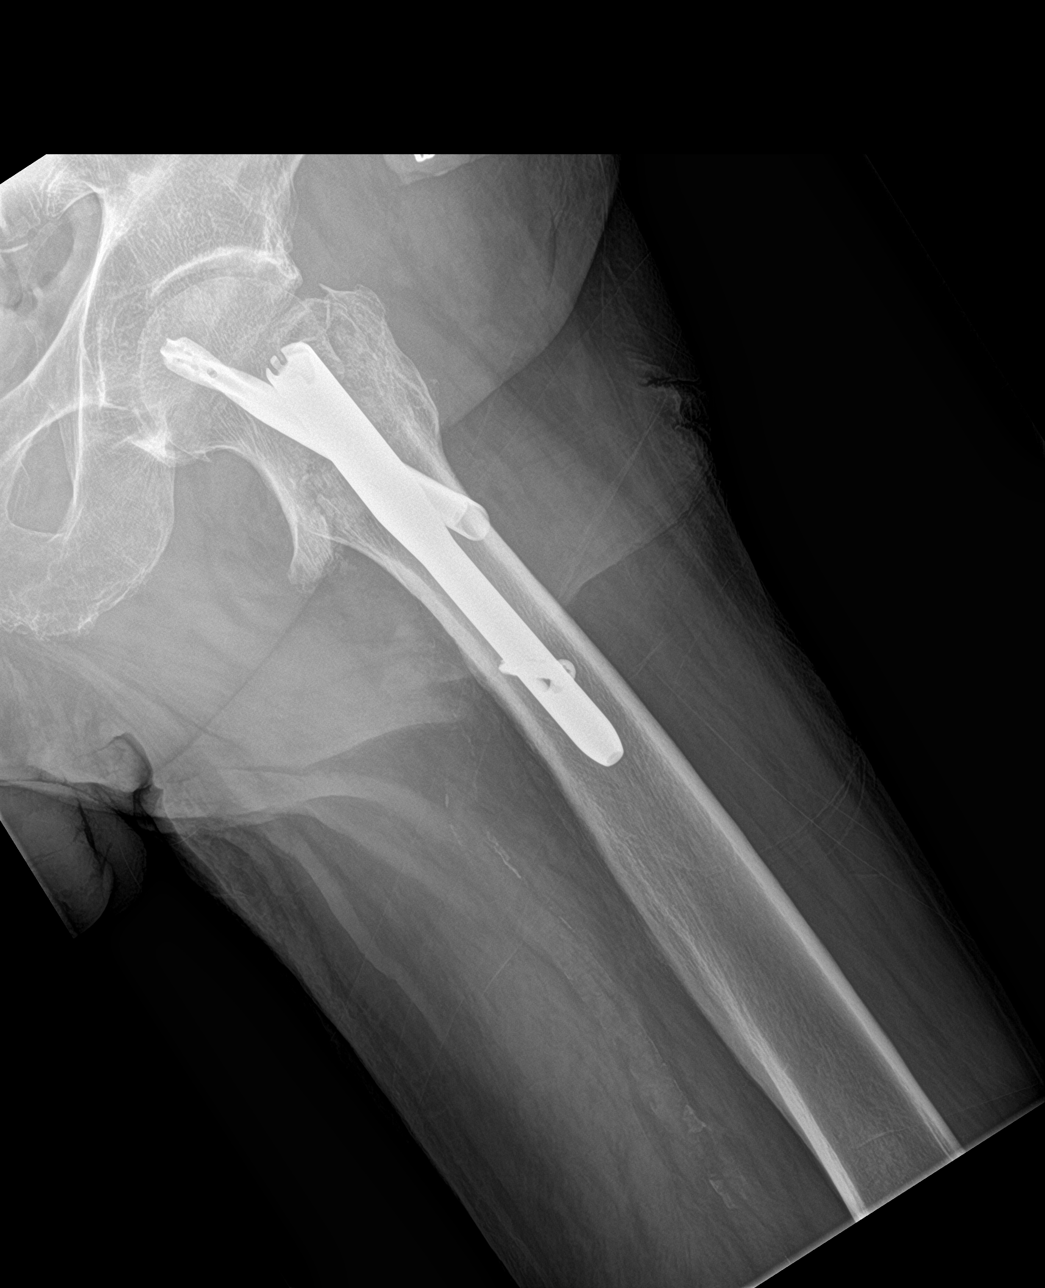

[3 of 3 positions shown; findings below may reference images not displayed]

FINDINGS: Status post surgical internal fixation of proximal left femoral
intertrochanteric fracture. No new fracture or dislocation is noted.
IMPRESSION: Status post surgical internal fixation of proximal left femoral
intertrochanteric fracture.

## 2020-12-17 NOTE — Progress Notes (Signed)
PROGRESS NOTE    James Sanford  XKG:[id number] DOB: [birth year] DOA: 11/23/2020 PCP: Jerl Mina, MD    Brief Narrative:  65 year old man with past medical history of hypertension hyperlipidemia atrial fibrillation and alcohol abuse presents to the hospital after a fall and found to have a left hip fracture.  On 11/25/2020 Dr. Odis Luster did a left intramedullary nail intertrochanteric procedure for left hip fracture.  The patient was seen by psychiatry and they recommended a Geri psych bed for moderate major depression.    Patient was having diarrhea.  This is resolved after administration of cholestyramine.  Stool GI PCR panel negative.  No/low suspicion for C. difficile.  Main ongoing issue has been fecal incontinence.  This is a chronic issue for patient.  Multiple colonoscopies and diagnostic work-up in the past have been inconclusive.  Started Bentyl on 9/11.  Seemingly good result.   Assessment & Plan:   Principal Problem:   Moderate major depression, single episode (HCC) Active Problems:   Alcohol abuse   Diabetes mellitus type 2, uncomplicated (HCC)   AF (paroxysmal atrial fibrillation) (HCC)   Essential hypertension   Closed nondisplaced intertrochanteric fracture of left femur (HCC)   Acute lower UTI   Protein-calorie malnutrition, severe   Malnutrition of moderate degree   Diarrhea   Impaired fasting glucose   Hypokalemia   Iron deficiency anemia  Major depression and initial suicidal ideation.  Patient on fluoxetine.  Awaiting a Geri psych bed.  Unfortunately no bed offers currently. Psych following Diarrhea.  continues to have loose stools. Had colonoscopy last year w/ biopsy that was not revealing. Has not followed up with GI. Gi pathogen panel negative earlier this hospitalization. B12 normal. Will check celiac panel, fecal calprotectin, and crp. Will likely need outpt GI f/u Fecal incontinence.  Unclear etiology. Chronic alcohol may contribute. Per patient  and wife this has been a chronic issue.  He has had multiple colonoscopies without etiology identified.  Started 4 times daily Bentyl on 9/11.  Checking above studies. Will need outpt GI f/u Left intertrochanteric hip fracture status post left intramedullary nail fixation on 11/25/2020 by Dr. Odis Luster.  Patient walking well with physical therapy.  Cleared for discharge from orthopedic standpoint. Due for f/u imaging today, discussed w/ dr. Hyacinth Meeker, will order left hip x-ray, ortho will f/u paroxysmal atrial fibrillation on metoprolol for rate control and Eliquis for anticoagulation to prevent stroke. Alcohol abuse.  Continue thiamine, folic acid and multivitamin Hypokalemia.  Replace potassium on a daily basis. Iron deficiency anemia.  Stable.  B12 normal range.  Continue oral iron supplementation   DVT prophylaxis: Eliquis Code Status: Full Family Communication: wife updated @ bedside 9/14 Disposition Plan: Status is: Inpatient  Remains inpatient appropriate because:Inpatient level of care appropriate due to severity of illness.  Unsafe discharge plan  Dispo: The patient is from: Home              Anticipated d/c is to:  [facility] psych inpatient facility              Patient currently is medically stable to d/c.   Difficult to place patient Yes  Needs Geri psych facility.  Currently no bed offers.     Level of care: Med-Surg  Consultants:  Orthopedics  Procedures:  IM nail for hip fracture 8/23  Antimicrobials: None   Subjective: And examined.  Resting comfortably in chair.  No visible distress.  Does endorse gas but states improvement in fecal incontinence  Objective: Vitals:  12/16/20 0857 12/16/20 2116 12/17/20 0508 12/17/20 0801  BP: 111/78 (!) 108/91 105/75 114/68  Pulse: (!) 53 (!) 52 (!) 55 65  Resp: 20 16 15 16   Temp: 97.9 F (36.6 C) 98 F (36.7 C) 98 F (36.7 C) 97.6 F (36.4 C)  TempSrc:      SpO2: 100% 100% 100% 100%  Weight:      Height:         Intake/Output Summary (Last 24 hours) at 12/17/2020 1350 Last data filed at 12/17/2020 1022 Gross per 24 hour  Intake 240 ml  Output 500 ml  Net -260 ml   Filed Weights   11/23/20 1438  Weight: 77.1 kg    Examination:  General exam: No acute distress Respiratory system: Lungs clear.  Normal work of breathing.  Room air Cardiovascular system: S1-S2, RRR, no murmurs, no pedal edema Gastrointestinal system: Abdomen is nondistended, soft and nontender. No organomegaly or masses felt. Normal bowel sounds heard. Central nervous system: Alert, oriented x2, no focal deficits Extremities: Symmetric 5 x 5 power. Skin: No rashes, lesions or ulcers Psychiatry: Judgement and insight appear normal. Mood & affect appropriate.     Data Reviewed: I have personally reviewed following labs and imaging studies  CBC: Recent Labs  Lab 12/15/20 0623  WBC 4.9  HGB 11.4*  HCT 34.6*  MCV 98.0  PLT 238   Basic Metabolic Panel: Recent Labs  Lab 12/15/20 0623  CREATININE 0.55*   GFR: Estimated Creatinine Clearance: 103.1 mL/min (A) (by C-G formula based on SCr of 0.55 mg/dL (L)). Liver Function Tests: No results for input(s): AST, ALT, ALKPHOS, BILITOT, PROT, ALBUMIN in the last 168 hours. No results for input(s): LIPASE, AMYLASE in the last 168 hours. No results for input(s): AMMONIA in the last 168 hours. Coagulation Profile: No results for input(s): INR, PROTIME in the last 168 hours. Cardiac Enzymes: No results for input(s): CKTOTAL, CKMB, CKMBINDEX, TROPONINI in the last 168 hours. BNP (last 3 results) No results for input(s): PROBNP in the last 8760 hours. HbA1C: No results for input(s): HGBA1C in the last 72 hours. CBG: Recent Labs  Lab 12/12/20 2140  GLUCAP 89   Lipid Profile: No results for input(s): CHOL, HDL, LDLCALC, TRIG, CHOLHDL, LDLDIRECT in the last 72 hours. Thyroid Function Tests: No results for input(s): TSH, T4TOTAL, FREET4, T3FREE, THYROIDAB in the last  72 hours. Anemia Panel: Recent Labs    12/17/20 0455  FOLATE 6.3   Sepsis Labs: No results for input(s): PROCALCITON, LATICACIDVEN in the last 168 hours.  No results found for this or any previous visit (from the past 240 hour(s)).        Radiology Studies: No results found.      Scheduled Meds:  apixaban  5 mg Oral BID   dicyclomine  20 mg Oral TID AC & HS   ferrous sulfate  325 mg Oral Q breakfast   FLUoxetine  20 mg Oral Daily   folic acid  1 mg Oral Daily   metoprolol succinate  25 mg Oral Daily   multivitamin with minerals  1 tablet Oral Daily   potassium chloride  40 mEq Oral Daily   simethicone  80 mg Oral QID   thiamine  100 mg Oral Daily   Continuous Infusions:   LOS: 23 days    Time spent: 30 minutes    12/19/20, MD [facility]   If 7PM-7AM, please contact night-coverage  12/17/2020, 1:50 PM

## 2020-12-18 DIAGNOSIS — F321 Major depressive disorder, single episode, moderate: Secondary | ICD-10-CM | POA: Diagnosis not present

## 2020-12-18 LAB — BASIC METABOLIC PANEL
Anion gap: 9 (ref 5–15)
BUN: 22 mg/dL (ref 8–23)
CO2: 24 mmol/L (ref 22–32)
Calcium: 8.6 mg/dL — ABNORMAL LOW (ref 8.9–10.3)
Chloride: 105 mmol/L (ref 98–111)
Creatinine, Ser: 0.6 mg/dL — ABNORMAL LOW (ref 0.61–1.24)
GFR, Estimated: >60 mL/min (ref >60)
Glucose, Bld: 94 mg/dL (ref 70–99)
Potassium: 4.1 mmol/L (ref 3.5–5.1)
Sodium: 138 mmol/L (ref 135–145)

## 2020-12-18 LAB — CBC
HCT: 35.1 % — ABNORMAL LOW (ref 39.0–52.0)
Hemoglobin: 11.4 g/dL — ABNORMAL LOW (ref 13.0–17.0)
MCH: 31.4 pg (ref 26.0–34.0)
MCHC: 32.5 g/dL (ref 30.0–36.0)
MCV: 96.7 fL (ref 80.0–100.0)
Platelets: 198 10*3/uL (ref 150–400)
RBC: 3.63 MIL/uL — ABNORMAL LOW (ref 4.22–5.81)
RDW: 14.3 % (ref 11.5–15.5)
WBC: 6.2 10*3/uL (ref 4.0–10.5)
nRBC: 0 % (ref 0.0–0.2)

## 2020-12-18 LAB — C-REACTIVE PROTEIN: CRP: 0.5 mg/dL (ref <1.0)

## 2020-12-18 MED ORDER — VITAMIN D 25 MCG (1000 UNIT) PO TABS
1000.0000 [IU] | ORAL_TABLET | Freq: Every day | ORAL | Status: DC
  Administered 2020-12-18 – 2020-12-27 (×10): 1000 [IU] via ORAL
  Filled 2020-12-18 (×10): qty 1

## 2020-12-18 MED ORDER — METOPROLOL SUCCINATE ER 25 MG PO TB24
12.5000 mg | ORAL_TABLET | Freq: Every day | ORAL | Status: DC
  Administered 2020-12-19 – 2020-12-27 (×8): 12.5 mg via ORAL
  Filled 2020-12-18 (×9): qty 1

## 2020-12-18 NOTE — Progress Notes (Signed)
Patient chart and x-rays reviewed. May continue to weight bear as tolerated. Hardware is in good position. May follow-up in orthopedic clinic in 6 weeks from today or earlier as needed. No new recommendations. Please call with questions.

## 2020-12-18 NOTE — Progress Notes (Signed)
Physical Therapy Treatment Patient Details Name: James Sanford MRN: [id number] DOB: 01/31/1956 Today's Date: 12/18/2020   History of Present Illness Per MD notes, pt is a 65 y.o. male who presented to the ER via EMS for evaluation of a fall with a left intertrochanteric hip fx and is currently s/p IM nail WBAT LLE. PMH includes alcohol abuse, diabetes mellitus, hypertension, sleep apnea, paroxysmal atrial fibrillation on anticoagulation. MD assessment includes severe depression with suicidal ideation, UTI, alcohol abuse, and displaced, impacted intertrochanteric fracture of the left femur.    PT Comments    Pt received in recliner agreeable to PT services to attempt [facility] training. Pt educated with PT demo on [facility] sequencing prior to attempting. Supervision with STS with no AD. Good static standing balance noted. Pt given SPC to use in RUE. Good understanding of sequencing with quick gait speed for 180' with minguard for safety. X2 bouts of sway relying on pt's stepping strategy to correct. Did not rely on PT to  correct. Pt returned to room and given RW to compare safest AD for pt. Amb 180' with RW with supervision with no sway or LOB and good gait speed. In sitting pt re-educated on [facility] and attempts to reduce gait speed but maintaining step through pattern in attempts to improve safety with SPC. Pt did display good carryover after PT education with no LoB and only requiring supervision for additional 180'. Returned to room with pt in recliner next trialing x5 STS with SPC with good understanding and safe performance with supervision. Pt denying need to attempt stairs with SPC. Per case manager, pt unable to receive geri-pscyh bed offers due to being on PT case load. Believe pt would benefit from additional PT visit to ensure safe use of SPC prior to completing PT orders.    Recommendations for follow up therapy are one component of a multi-disciplinary discharge planning process, led by the  attending physician.  Recommendations may be updated based on patient status, additional functional criteria and insurance authorization.  Follow Up Recommendations  Home health PT     Equipment Recommendations  Cane    Recommendations for Other Services       Precautions / Restrictions Precautions Precautions: Fall Restrictions Weight Bearing Restrictions: No LLE Weight Bearing: Weight bearing as tolerated     Mobility  Bed Mobility Overal bed mobility: Modified Independent             General bed mobility comments: Pt received in recliner Patient Response: Cooperative  Transfers   Equipment used: Rolling walker (2 wheeled);Straight cane Transfers: Sit to/from Stand Sit to Stand: Supervision         General transfer comment: Performed x5 STS with SPC in RUE with safe performance with no LOB.  Ambulation/Gait Ambulation/Gait assistance: Supervision;Min guard Gait Distance (Feet): 540 Feet Assistive device: Rolling walker (2 wheeled);Straight cane Gait Pattern/deviations: Decreased step length - left;Trunk flexed;Decreased step length - right;Step-through pattern Gait velocity: decreased   General Gait Details: 180' with SPC with minguard, 180' with RW with supervision, 180 with SPC with supervision (after gait education in room)   Stairs         General stair comments: Denied need to perform stairs with SPC. D/cing to geri psych if bed becomes available.   Wheelchair Mobility    Modified Rankin (Stroke Patients Only)       Balance Overall balance assessment: Needs assistance Sitting-balance support: No upper extremity supported;Feet supported Sitting balance-Leahy Scale: Normal     Standing  balance support: Single extremity supported Standing balance-Leahy Scale: Good Standing balance comment: with SPC                            Cognition Arousal/Alertness: Awake/alert Behavior During Therapy: WFL for tasks  assessed/performed Overall Cognitive Status: Within Functional Limits for tasks assessed                                        Exercises Other Exercises Other Exercises: Gait training trialing RW versus SPC. Gait education on reducing speed with SPC while maintaining step through pattern. Prgoressed with SPC from minguard with x2 step strategy to correct balance to supervision and no LOB after education.    General Comments        Pertinent Vitals/Pain Pain Assessment: No/denies pain Pain Score: 0-No pain    Home Living                      Prior Function            PT Goals (current goals can now be found in the care plan section) Acute Rehab PT Goals Patient Stated Goal: I want to get better PT Goal Formulation: With patient Time For Goal Achievement: 12/23/20 Potential to Achieve Goals: Good Progress towards PT goals: Progressing toward goals    Frequency    Min 2X/week      PT Plan Current plan remains appropriate    Co-evaluation              AM-PAC PT "6 Clicks" Mobility   Outcome Measure  Help needed turning from your back to your side while in a flat bed without using bedrails?: None Help needed moving from lying on your back to sitting on the side of a flat bed without using bedrails?: None Help needed moving to and from a bed to a chair (including a wheelchair)?: A Little Help needed standing up from a chair using your arms (e.g., wheelchair or bedside chair)?: A Little Help needed to walk in hospital room?: A Little Help needed climbing 3-5 steps with a railing? : A Little 6 Click Score: 20    End of Session Equipment Utilized During Treatment: Gait belt Activity Tolerance: Patient tolerated treatment well Patient left: in chair;with chair alarm set Nurse Communication: Mobility status PT Visit Diagnosis: Unsteadiness on feet (R26.81);Other abnormalities of gait and mobility (R26.89);Muscle weakness (generalized)  (M62.81);History of falling (Z91.81) Pain - Right/Left: Left     Time: 1040-1056 PT Time Calculation (min) (ACUTE ONLY): 16 min  Charges:  $Gait Training: 8-22 mins                     Delphia Grates. Fairly IV, PT, DPT Physical Therapist- [facility]  12/18/2020, 12:20 PM

## 2020-12-18 NOTE — Progress Notes (Signed)
PROGRESS NOTE    James Sanford  JKD:[id number] DOB: 06-Aug-1955 DOA: 11/23/2020 PCP: Jerl Mina, MD    Brief Narrative:  65 year old man with past medical history of hypertension hyperlipidemia atrial fibrillation and alcohol abuse presents to the hospital after a fall and found to have a left hip fracture.  On 11/25/2020 Dr. Odis Luster did a left intramedullary nail intertrochanteric procedure for left hip fracture.  The patient was seen by psychiatry and they recommended a Geri psych bed for moderate major depression.    Patient was having diarrhea.  This is resolved after administration of cholestyramine.  Stool GI PCR panel negative.  No/low suspicion for C. difficile.  Main ongoing issue has been fecal incontinence.  This is a chronic issue for patient.  Multiple colonoscopies and diagnostic work-up in the past have been inconclusive.  Started Bentyl on 9/11.  Seemingly good result.   Assessment & Plan:   Principal Problem:   Moderate major depression, single episode (HCC) Active Problems:   Alcohol abuse   Diabetes mellitus type 2, uncomplicated (HCC)   AF (paroxysmal atrial fibrillation) (HCC)   Essential hypertension   Closed nondisplaced intertrochanteric fracture of left femur (HCC)   Acute lower UTI   Protein-calorie malnutrition, severe   Malnutrition of moderate degree   Diarrhea   Impaired fasting glucose   Hypokalemia   Iron deficiency anemia  Major depression and initial suicidal ideation.  Patient on fluoxetine.  Awaiting a Geri psych bed.  Unfortunately no bed offers currently. Psych following Diarrhea.  continues to have loose stools. Had colonoscopy last year w/ biopsy that was not revealing. Has not followed up with GI. Gi pathogen panel negative earlier this hospitalization. B12 normal. Will check celiac panel, fecal calprotectin, and crp (normal). Will likely need outpt GI f/u Fecal incontinence.  Unclear etiology. Chronic alcohol may contribute. Per  patient and wife this has been a chronic issue.  He has had multiple colonoscopies without etiology identified.  Started 4 times daily Bentyl on 9/11.  Checking above studies. Will need outpt GI f/u Left intertrochanteric hip fracture status post left intramedullary nail fixation on 11/25/2020 by Dr. Odis Luster.  Patient walking well with physical therapy.  Cleared for discharge from orthopedic standpoint. Repeat imaging obtained 9/14 stable and reviewed by ortho, they advise 6 wk f/u in clinic I.e. on or around 11/1 paroxysmal atrial fibrillation on metoprolol for rate control and Eliquis for anticoagulation to prevent stroke. Hr 50s, bp low, asymptomatic, will reduce dose of metoprolol to 12.5 Alcohol abuse.  Continue thiamine, folic acid and multivitamin. No s/s withdrawal Hypokalemia.  Resolved Iron deficiency anemia.  Stable.  B12 normal range.  Continue oral iron supplementation   DVT prophylaxis: Eliquis Code Status: Full Family Communication: wife updated @ bedside 9/14 Disposition Plan: Status is: Inpatient  Remains inpatient appropriate because:Inpatient level of care appropriate due to severity of illness.  Unsafe discharge plan  Dispo: The patient is from: Home              Anticipated d/c is to:  [facility] psych inpatient facility              Patient currently is medically stable to d/c.   Difficult to place patient Yes  Needs Geri psych facility.  Currently no bed offers.     Level of care: Med-Surg  Consultants:  Orthopedics  Procedures:  IM nail for hip fracture 8/23  Antimicrobials: None   Subjective: And examined.  Resting comfortably in chair.  No complaints.  No hip pain. Working with PT.  Objective: Vitals:   12/17/20 2046 12/18/20 0530 12/18/20 0648 12/18/20 0805  BP: 98/72 (!) 87/74 93/66 99/75   Pulse: 60 (!) 54 (!) 57 (!) 58  Resp: 16 18  15   Temp: 98 F (36.7 C) 97.6 F (36.4 C)  97.8 F (36.6 C)  TempSrc:  Oral    SpO2: 99% 100%  100%  Weight:       Height:        Intake/Output Summary (Last 24 hours) at 12/18/2020 1224 Last data filed at 12/18/2020 1024 Gross per 24 hour  Intake 360 ml  Output 700 ml  Net -340 ml   Filed Weights   11/23/20 1438  Weight: 77.1 kg    Examination:  General exam: No acute distress Respiratory system: Lungs clear.  Normal work of breathing.  Room air Cardiovascular system: S1-S2, RRR, no murmurs, no pedal edema Gastrointestinal system: Abdomen is nondistended, soft and nontender. No organomegaly or masses felt. Normal bowel sounds heard. Central nervous system: Alert, oriented x2, no focal deficits Extremities: Symmetric 5 x 5 power. Skin: No rashes, lesions or ulcers Psychiatry: Judgement and insight appear normal. Mood & affect appropriate.     Data Reviewed: I have personally reviewed following labs and imaging studies  CBC: Recent Labs  Lab 12/15/20 0623 12/18/20 0255  WBC 4.9 6.2  HGB 11.4* 11.4*  HCT 34.6* 35.1*  MCV 98.0 96.7  PLT 238 198   Basic Metabolic Panel: Recent Labs  Lab 12/15/20 0623 12/18/20 0255  NA  --  138  K  --  4.1  CL  --  105  CO2  --  24  GLUCOSE  --  94  BUN  --  22  CREATININE 0.55* 0.60*  CALCIUM  --  8.6*   GFR: Estimated Creatinine Clearance: 103.1 mL/min (A) (by C-G formula based on SCr of 0.6 mg/dL (L)). Liver Function Tests: No results for input(s): AST, ALT, ALKPHOS, BILITOT, PROT, ALBUMIN in the last 168 hours. No results for input(s): LIPASE, AMYLASE in the last 168 hours. No results for input(s): AMMONIA in the last 168 hours. Coagulation Profile: No results for input(s): INR, PROTIME in the last 168 hours. Cardiac Enzymes: No results for input(s): CKTOTAL, CKMB, CKMBINDEX, TROPONINI in the last 168 hours. BNP (last 3 results) No results for input(s): PROBNP in the last 8760 hours. HbA1C: No results for input(s): HGBA1C in the last 72 hours. CBG: Recent Labs  Lab 12/12/20 2140  GLUCAP 89   Lipid Profile: No results for  input(s): CHOL, HDL, LDLCALC, TRIG, CHOLHDL, LDLDIRECT in the last 72 hours. Thyroid Function Tests: No results for input(s): TSH, T4TOTAL, FREET4, T3FREE, THYROIDAB in the last 72 hours. Anemia Panel: Recent Labs    12/17/20 0455  FOLATE 6.3   Sepsis Labs: No results for input(s): PROCALCITON, LATICACIDVEN in the last 168 hours.  No results found for this or any previous visit (from the past 240 hour(s)).        Radiology Studies: DG HIP UNILAT WITH PELVIS 2-3 VIEWS LEFT  Result Date: 12/17/2020 CLINICAL DATA:  Left hip fracture. EXAM: DG HIP (WITH OR WITHOUT PELVIS) 2-3V LEFT COMPARISON:  November 25, 2020.  November 24, 2020. FINDINGS: Status post surgical internal fixation of proximal left femoral intertrochanteric fracture. No new fracture or dislocation is noted. IMPRESSION: Status post surgical internal fixation of proximal left femoral intertrochanteric fracture. Electronically Signed   By: November 27, 2020 M.D.   On: 12/17/2020 16:18  Scheduled Meds:  apixaban  5 mg Oral BID   cholecalciferol  1,000 Units Oral Daily   dicyclomine  20 mg Oral TID AC & HS   ferrous sulfate  325 mg Oral Q breakfast   FLUoxetine  20 mg Oral Daily   folic acid  1 mg Oral Daily   metoprolol succinate  25 mg Oral Daily   multivitamin with minerals  1 tablet Oral Daily   potassium chloride  40 mEq Oral Daily   simethicone  80 mg Oral QID   thiamine  100 mg Oral Daily   Continuous Infusions:   LOS: 24 days    Time spent: 20 minutes    Silvano Bilis, MD [facility]   If 7PM-7AM, please contact night-coverage  12/18/2020, 12:24 PM

## 2020-12-19 DIAGNOSIS — F321 Major depressive disorder, single episode, moderate: Secondary | ICD-10-CM | POA: Diagnosis not present

## 2020-12-19 LAB — ZINC: Zinc: 62 ug/dL (ref 44–115)

## 2020-12-19 NOTE — Progress Notes (Signed)
PROGRESS NOTE    James Sanford  WUJ:[id number] DOB: [birth year] DOA: 11/23/2020 PCP: Jerl Mina, MD    Brief Narrative:  65 year old man with past medical history of hypertension hyperlipidemia atrial fibrillation and alcohol abuse presents to the hospital after a fall and found to have a left hip fracture.  On 11/25/2020 Dr. Odis Luster did a left intramedullary nail intertrochanteric procedure for left hip fracture.  The patient was seen by psychiatry and they recommended a Geri psych bed for moderate major depression.    Patient was having diarrhea.  This is resolved after administration of cholestyramine.  Stool GI PCR panel negative.  No/low suspicion for C. difficile.  Main ongoing issue has been fecal incontinence.  This is a chronic issue for patient.  Multiple colonoscopies and diagnostic work-up in the past have been inconclusive.  Started Bentyl on 9/11.  Seemingly good result.   Assessment & Plan:   Principal Problem:   Moderate major depression, single episode (HCC) Active Problems:   Alcohol abuse   Diabetes mellitus type 2, uncomplicated (HCC)   AF (paroxysmal atrial fibrillation) (HCC)   Essential hypertension   Closed nondisplaced intertrochanteric fracture of left femur (HCC)   Acute lower UTI   Protein-calorie malnutrition, severe   Malnutrition of moderate degree   Diarrhea   Impaired fasting glucose   Hypokalemia   Iron deficiency anemia  Major depression and initial suicidal ideation.  Patient on fluoxetine.  Awaiting a Geri psych bed.  Unfortunately no bed offers currently. Psych following Diarrhea.  continues to have loose stools couple of times daily. Had colonoscopy last year w/ biopsy that was not revealing. Has not followed up with GI. Gi pathogen panel negative earlier this hospitalization. B12 normal. Will check celiac panel, fecal calprotectin, and crp (normal). Will likely need outpt GI f/u Fecal incontinence.  Unclear etiology. Chronic alcohol  may contribute. Per patient and wife this has been a chronic issue.  He has had multiple colonoscopies without etiology identified.  Started 4 times daily Bentyl on 9/11.  Checking above studies. Will need outpt GI f/u Left intertrochanteric hip fracture status post left intramedullary nail fixation on 11/25/2020 by Dr. Odis Luster.  Patient walking well with physical therapy.  Cleared for discharge from orthopedic standpoint. Repeat imaging obtained 9/14 stable and reviewed by ortho, they advise 6 wk f/u in clinic I.e. on or around 11/1 paroxysmal atrial fibrillation on metoprolol for rate control and Eliquis for anticoagulation to prevent stroke. Have reduced dose of metop to 12.5. Alcohol abuse.  Continue thiamine, folic acid and multivitamin. No s/s withdrawal Hypokalemia.  Resolved Iron deficiency anemia.  Stable.  B12 normal range.  Continue oral iron supplementation   DVT prophylaxis: Eliquis Code Status: Full Family Communication: wife updated @ bedside 9/14. No answer when called today Disposition Plan: Status is: Inpatient  Remains inpatient appropriate because:Inpatient level of care appropriate due to severity of illness.  Unsafe discharge plan  Dispo: The patient is from: Home              Anticipated d/c is to:  [facility] psych inpatient facility              Patient currently is medically stable to d/c.   Difficult to place patient Yes  Needs Geri psych facility.  Currently no bed offers.     Level of care: Med-Surg  Consultants:  Orthopedics  Procedures:  IM nail for hip fracture 8/23  Antimicrobials: None   Subjective: And examined.  Resting comfortably in  chair.  No complaints. No hip pain. Working with PT.  Objective: Vitals:   12/18/20 2037 12/19/20 0611 12/19/20 0935 12/19/20 1154  BP: 92/67 119/77 111/72 108/76  Pulse: 64 76 82 (!) 57  Resp: 16 16 18 18   Temp: 98.8 F (37.1 C) 97.8 F (36.6 C) 98 F (36.7 C) 97.8 F (36.6 C)  TempSrc:   Oral   SpO2: 100%  98% 99% 100%  Weight:      Height:       No intake or output data in the 24 hours ending 12/19/20 1333  Filed Weights   11/23/20 1438  Weight: 77.1 kg    Examination:  General exam: No acute distress Respiratory system: Lungs clear.  Normal work of breathing.  Room air Cardiovascular system: S1-S2, RRR, no murmurs, no pedal edema Gastrointestinal system: Abdomen is nondistended, soft and nontender. No organomegaly or masses felt. Normal bowel sounds heard. Central nervous system: Alert, oriented x2, no focal deficits Extremities: Symmetric 5 x 5 power. Skin: No rashes, lesions or ulcers Psychiatry: Judgement and insight appear normal. Mood & affect appropriate.     Data Reviewed: I have personally reviewed following labs and imaging studies  CBC: Recent Labs  Lab 12/15/20 0623 12/18/20 0255  WBC 4.9 6.2  HGB 11.4* 11.4*  HCT 34.6* 35.1*  MCV 98.0 96.7  PLT 238 198   Basic Metabolic Panel: Recent Labs  Lab 12/15/20 0623 12/18/20 0255  NA  --  138  K  --  4.1  CL  --  105  CO2  --  24  GLUCOSE  --  94  BUN  --  22  CREATININE 0.55* 0.60*  CALCIUM  --  8.6*   GFR: Estimated Creatinine Clearance: 103.1 mL/min (A) (by C-G formula based on SCr of 0.6 mg/dL (L)). Liver Function Tests: No results for input(s): AST, ALT, ALKPHOS, BILITOT, PROT, ALBUMIN in the last 168 hours. No results for input(s): LIPASE, AMYLASE in the last 168 hours. No results for input(s): AMMONIA in the last 168 hours. Coagulation Profile: No results for input(s): INR, PROTIME in the last 168 hours. Cardiac Enzymes: No results for input(s): CKTOTAL, CKMB, CKMBINDEX, TROPONINI in the last 168 hours. BNP (last 3 results) No results for input(s): PROBNP in the last 8760 hours. HbA1C: No results for input(s): HGBA1C in the last 72 hours. CBG: Recent Labs  Lab 12/12/20 2140  GLUCAP 89   Lipid Profile: No results for input(s): CHOL, HDL, LDLCALC, TRIG, CHOLHDL, LDLDIRECT in the last 72  hours. Thyroid Function Tests: No results for input(s): TSH, T4TOTAL, FREET4, T3FREE, THYROIDAB in the last 72 hours. Anemia Panel: Recent Labs    12/17/20 0455  FOLATE 6.3   Sepsis Labs: No results for input(s): PROCALCITON, LATICACIDVEN in the last 168 hours.  No results found for this or any previous visit (from the past 240 hour(s)).        Radiology Studies: DG HIP UNILAT WITH PELVIS 2-3 VIEWS LEFT  Result Date: 12/17/2020 CLINICAL DATA:  Left hip fracture. EXAM: DG HIP (WITH OR WITHOUT PELVIS) 2-3V LEFT COMPARISON:  November 25, 2020.  November 24, 2020. FINDINGS: Status post surgical internal fixation of proximal left femoral intertrochanteric fracture. No new fracture or dislocation is noted. IMPRESSION: Status post surgical internal fixation of proximal left femoral intertrochanteric fracture. Electronically Signed   By: November 26, 2020 M.D.   On: 12/17/2020 16:18        Scheduled Meds:  apixaban  5 mg Oral BID  cholecalciferol  1,000 Units Oral Daily   dicyclomine  20 mg Oral TID AC & HS   ferrous sulfate  325 mg Oral Q breakfast   FLUoxetine  20 mg Oral Daily   folic acid  1 mg Oral Daily   metoprolol succinate  12.5 mg Oral Daily   multivitamin with minerals  1 tablet Oral Daily   potassium chloride  40 mEq Oral Daily   simethicone  80 mg Oral QID   thiamine  100 mg Oral Daily   Continuous Infusions:   LOS: 25 days    Time spent: 20 minutes    Silvano Bilis, MD [facility]   If 7PM-7AM, please contact night-coverage  12/19/2020, 1:33 PM

## 2020-12-20 DIAGNOSIS — R159 Full incontinence of feces: Secondary | ICD-10-CM

## 2020-12-20 DIAGNOSIS — F321 Major depressive disorder, single episode, moderate: Secondary | ICD-10-CM | POA: Diagnosis not present

## 2020-12-20 DIAGNOSIS — Z980 Intestinal bypass and anastomosis status: Secondary | ICD-10-CM | POA: Diagnosis not present

## 2020-12-20 DIAGNOSIS — R197 Diarrhea, unspecified: Secondary | ICD-10-CM | POA: Diagnosis not present

## 2020-12-20 LAB — GLIA (IGA/G) + TTG IGA
Antigliadin Abs, IgA: 3 units (ref 0–19)
Gliadin IgG: 1 units (ref 0–19)
Tissue Transglutaminase Ab, IgA: <2 U/mL (ref 0–3)

## 2020-12-20 LAB — VITAMIN B6: Vitamin B6: 4.1 ug/L (ref 3.4–65.2)

## 2020-12-20 LAB — CALPROTECTIN, FECAL: Calprotectin, Fecal: 120 ug/g (ref 0–120)

## 2020-12-20 NOTE — Progress Notes (Addendum)
PROGRESS NOTE    James Sanford  [id number]:[id number] DOB: 1955/06/11 DOA: 11/23/2020 PCP: Jerl Mina, MD    Brief Narrative:  65 year old man with past medical history of hypertension hyperlipidemia atrial fibrillation and alcohol abuse presents to the hospital after a fall and found to have a left hip fracture.  On 11/25/2020 Dr. Odis Luster did a left intramedullary nail intertrochanteric procedure for left hip fracture.  The patient was seen by psychiatry and they recommended a Geri psych bed for moderate major depression.    Patient was having diarrhea.  This is resolved after administration of cholestyramine.  Stool GI PCR panel negative.  No/low suspicion for C. difficile.  Main ongoing issue has been fecal incontinence.  This is a chronic issue for patient.  Multiple colonoscopies and diagnostic work-up in the past have been inconclusive.  Started Bentyl on 9/11.     Assessment & Plan:   Principal Problem:   Moderate major depression, single episode (HCC) Active Problems:   Alcohol abuse   Diabetes mellitus type 2, uncomplicated (HCC)   AF (paroxysmal atrial fibrillation) (HCC)   Essential hypertension   Closed nondisplaced intertrochanteric fracture of left femur (HCC)   Acute lower UTI   Protein-calorie malnutrition, severe   Malnutrition of moderate degree   Diarrhea   Impaired fasting glucose   Hypokalemia   Iron deficiency anemia  Major depression and initial suicidal ideation.  Patient on fluoxetine.  Awaiting a Geri psych bed.  Unfortunately no bed offers currently. Psych following. Diarrhea.  continues to have loose stools couple of times daily. Had colonoscopy last year w/ biopsy that was not revealing. Has not followed up with GI. Gi pathogen panel negative earlier this hospitalization. B12 normal. Will check celiac panel, fecal calprotectin, and crp (normal). Will likely need outpt GI f/u Fecal incontinence.  Unclear etiology. Chronic alcohol may contribute. Per  patient and wife this has been a chronic issue.  He has had multiple colonoscopies without etiology identified.  Started 4 times daily Bentyl on 9/11.  Checking above studies. Rectal exam today with what appears to be normal tone. Will Will need outpt GI f/u. Discussed w/ gi today, advised fecal elastase to eval for panc insufficiency, will order. Left intertrochanteric hip fracture status post left intramedullary nail fixation on 11/25/2020 by Dr. Odis Luster.  Patient walking well with physical therapy.  Cleared for discharge from orthopedic standpoint. Repeat imaging obtained 9/14 stable and reviewed by ortho, they advise 6 wk f/u in clinic I.e. on or around 11/1 paroxysmal atrial fibrillation on metoprolol for rate control and Eliquis for anticoagulation to prevent stroke. Have reduced dose of metop to 12.5. Alcohol abuse.  Continue thiamine, folic acid and multivitamin. No s/s withdrawal Hypokalemia.  Resolved Iron deficiency anemia.  Stable.  B12 normal range.  Continue oral iron supplementation   DVT prophylaxis: Eliquis Code Status: Full Family Communication: wife updated telephonically 9/17 Disposition Plan: Status is: Inpatient  Remains inpatient appropriate because:Inpatient level of care appropriate due to severity of illness.  Unsafe discharge plan  Dispo: The patient is from: Home              Anticipated d/c is to:  [facility] psych inpatient facility              Patient currently is medically stable to d/c.   Difficult to place patient Yes  Needs Geri psych facility.  Currently no bed offers.     Level of care: Med-Surg  Consultants:  Orthopedics  Procedures:  IM  nail for hip fracture 8/23  Antimicrobials: None   Subjective: And examined.  Resting comfortably in chair.  No complaints. No hip pain. Working with PT.  Objective: Vitals:   12/19/20 2206 12/20/20 0625 12/20/20 0823 12/20/20 1144  BP: 104/73 93/64 104/71 104/82  Pulse: 69 63 (!) 59 (!) 57  Resp: 17  16 16    Temp: 98.4 F (36.9 C) 97.8 F (36.6 C) 98.1 F (36.7 C) 97.8 F (36.6 C)  TempSrc: Oral Oral    SpO2: 100% 97% 99% 100%  Weight:      Height:        Intake/Output Summary (Last 24 hours) at 12/20/2020 1345 Last data filed at 12/20/2020 0719 Gross per 24 hour  Intake 480 ml  Output 500 ml  Net -20 ml    Filed Weights   11/23/20 1438  Weight: 77.1 kg    Examination:  General exam: No acute distress Respiratory system: Lungs clear.  Normal work of breathing.  Room air Cardiovascular system: S1-S2, RRR, no murmurs, no pedal edema Gastrointestinal system: Abdomen is nondistended, soft and nontender. No organomegaly or masses felt. Normal bowel sounds heard. Central nervous system: Alert, oriented x2, no focal deficits GU; normal rectal tone on dre Extremities: Symmetric 5 x 5 power. Skin: No rashes, lesions or ulcers Psychiatry: Judgement and insight appear normal. Mood & affect appropriate.     Data Reviewed: I have personally reviewed following labs and imaging studies  CBC: Recent Labs  Lab 12/15/20 0623 12/18/20 0255  WBC 4.9 6.2  HGB 11.4* 11.4*  HCT 34.6* 35.1*  MCV 98.0 96.7  PLT 238 198   Basic Metabolic Panel: Recent Labs  Lab 12/15/20 0623 12/18/20 0255  NA  --  138  K  --  4.1  CL  --  105  CO2  --  24  GLUCOSE  --  94  BUN  --  22  CREATININE 0.55* 0.60*  CALCIUM  --  8.6*   GFR: Estimated Creatinine Clearance: 103.1 mL/min (A) (by C-G formula based on SCr of 0.6 mg/dL (L)). Liver Function Tests: No results for input(s): AST, ALT, ALKPHOS, BILITOT, PROT, ALBUMIN in the last 168 hours. No results for input(s): LIPASE, AMYLASE in the last 168 hours. No results for input(s): AMMONIA in the last 168 hours. Coagulation Profile: No results for input(s): INR, PROTIME in the last 168 hours. Cardiac Enzymes: No results for input(s): CKTOTAL, CKMB, CKMBINDEX, TROPONINI in the last 168 hours. BNP (last 3 results) No results for input(s): PROBNP  in the last 8760 hours. HbA1C: No results for input(s): HGBA1C in the last 72 hours. CBG: No results for input(s): GLUCAP in the last 168 hours.  Lipid Profile: No results for input(s): CHOL, HDL, LDLCALC, TRIG, CHOLHDL, LDLDIRECT in the last 72 hours. Thyroid Function Tests: No results for input(s): TSH, T4TOTAL, FREET4, T3FREE, THYROIDAB in the last 72 hours. Anemia Panel: No results for input(s): VITAMINB12, FOLATE, FERRITIN, TIBC, IRON, RETICCTPCT in the last 72 hours.  Sepsis Labs: No results for input(s): PROCALCITON, LATICACIDVEN in the last 168 hours.  No results found for this or any previous visit (from the past 240 hour(s)).        Radiology Studies: No results found.      Scheduled Meds:  apixaban  5 mg Oral BID   cholecalciferol  1,000 Units Oral Daily   dicyclomine  20 mg Oral TID AC & HS   ferrous sulfate  325 mg Oral Q breakfast   FLUoxetine  20 mg Oral Daily   folic acid  1 mg Oral Daily   metoprolol succinate  12.5 mg Oral Daily   multivitamin with minerals  1 tablet Oral Daily   potassium chloride  40 mEq Oral Daily   simethicone  80 mg Oral QID   thiamine  100 mg Oral Daily   Continuous Infusions:   LOS: 26 days    Time spent: 20 minutes    Silvano Bilis, MD [facility]   If 7PM-7AM, please contact night-coverage  12/20/2020, 1:45 PM

## 2020-12-20 NOTE — Consult Note (Signed)
Melodie Bouillon, MD [address], [address], [place], [place], [address] [address], [address], [place], [place], [address] Phone: [phone]  Fax: [phone]  Consultation  Referring Provider:     Dr. Ashok Pall Primary Care Physician:  Jerl Mina, MD Reason for Consultation:     Fecal incontinence  Date of Admission:  11/23/2020 Date of Consultation:  12/20/2020         HPI:   James Sanford is a 65 y.o. male admitted post fall and found to have a left hip fracture, status post left intramedullary nail intertrochanteric procedure for left hip fracture on 11/25/2020.  GI being consulted for fecal incontinence with chronic history of diarrhea.  Patient previously seen by Dr. Allegra Lai in an inpatient setting for hematochezia, melena and was noted to have chronic diarrhea at that time as well.  Patient underwent EGD and colonoscopy in October 2021.  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  Small hiatal hernia.  Otherwise normal.  Initial colonoscopy showed a poor prep and diverticulosis and blood in the entire colon, with colonoscopy repeated on the next day.  Repeat colonoscopy also showed a poor prep, with normal mucosa in the entire colon and diverticulosis.  Nonbleeding external hemorrhoids were reported.  Biopsies were negative for microscopic colitis.  Fecal calprotectin was 120 on this admission.  Celiac panel including TTG IgA, gliadin IgG, antigliadin IgA were negative.  Infectious GI panel was negative  Patient reports 2 loose bowel movements today  Past Medical History:  Diagnosis Date  . Arthritis   . Diabetes (HCC)   . Hyperlipemia   . Hypertension   . Sleep apnea     Past Surgical History:  Procedure Laterality Date  . COLONOSCOPY WITH PROPOFOL N/A 01/28/2020   Procedure: COLONOSCOPY WITH PROPOFOL;  Surgeon: Toney Reil, MD;  Location: [facility] ENDOSCOPY;  Service: Gastroenterology;  Laterality: N/A;  . COLONOSCOPY WITH PROPOFOL  N/A 01/28/2020   Procedure: COLONOSCOPY WITH PROPOFOL;  Surgeon: Toney Reil, MD;  Location: [facility] ENDOSCOPY;  Service: Gastroenterology;  Laterality: N/A;  . ESOPHAGOGASTRODUODENOSCOPY (EGD) WITH PROPOFOL N/A 01/28/2020   Procedure: ESOPHAGOGASTRODUODENOSCOPY (EGD) WITH PROPOFOL;  Surgeon: Toney Reil, MD;  Location: [facility] ENDOSCOPY;  Service: Gastroenterology;  Laterality: N/A;  . GASTRIC BYPASS    . HERNIA REPAIR    . INTRAMEDULLARY (IM) NAIL INTERTROCHANTERIC Left 11/25/2020   Procedure: INTRAMEDULLARY (IM) NAIL INTERTROCHANTRIC;  Surgeon: Lyndle Herrlich, MD;  Location: [facility] ORS;  Service: Orthopedics;  Laterality: Left;  . JOINT REPLACEMENT Bilateral    Knee surgery  . OPEN REDUCTION INTERNAL FIXATION (ORIF) DISTAL RADIAL FRACTURE Right 10/30/2017   Procedure: OPEN REDUCTION INTERNAL FIXATION (ORIF) DISTAL RADIAL FRACTURE;  Surgeon: Garnette Gunner, MD;  Location: [facility] ORS;  Service: Orthopedics;  Laterality: Right;  . ORIF FEMUR FRACTURE Right 01/29/2020   Procedure: OPEN REDUCTION INTERNAL FIXATION (ORIF) DISTAL FEMUR FRACTURE;  Surgeon: Christena Flake, MD;  Location: [facility] ORS;  Service: Orthopedics;  Laterality: Right;    Prior to Admission medications   Medication Sig Start Date End Date Taking? Authorizing Provider  ELIQUIS 5 MG TABS tablet Take 5 mg by mouth 2 (two) times daily. 01/05/20  Yes [provider]  metoprolol succinate (TOPROL-XL) 50 MG 24 hr tablet Take 0.5 tablets (25 mg total) by mouth daily. 02/06/20  Yes Enedina Finner, MD  Saccharomyces boulardii (PROBIOTIC) 250 MG CAPS Take 1 capsule by mouth in the morning and at bedtime. 11/24/20  Yes Willy Eddy, MD  HYDROcodone-acetaminophen (  NORCO/VICODIN) 5-325 MG tablet Take 1 tablet by mouth every 6 (six) hours as needed for moderate pain. 11/26/20   Altamese Cabal, PA-C    Family History  Problem Relation Age of Onset  . Stroke Mother   . Suicidality Father      Social History   Tobacco Use   . Smoking status: Never  . Smokeless tobacco: Former    Types: Chew  . Tobacco comments:    Luellen Pucker about 4 years ago  Vaping Use  . Vaping Use: Never used  Substance Use Topics  . Alcohol use: Yes    Comment: beer occasionally  . Drug use: Not Currently    Allergies as of 11/23/2020 - Review Complete 11/23/2020  Allergen Reaction Noted  . Penicillins  01/25/2020    Review of Systems:    All systems reviewed and negative except where noted in HPI.   Physical Exam:  Constitutional: General:   Alert,  Well-developed, well-nourished, pleasant and cooperative in NAD BP 104/80 (BP Location: Right Arm)   Pulse 64   Temp 98 F (36.7 C)   Resp 17   Ht 6' (1.829 m)   Wt 77.1 kg   SpO2 100%   BMI 23.06 kg/m   Eyes:  Sclera clear, no icterus.   Conjunctiva pink. PERRLA  Ears:  No scars, lesions or masses, Normal auditory acuity. Nose:  No deformity, discharge, or lesions. Mouth:  No deformity or lesions, oropharynx pink & moist.  Neck:  Supple; no masses or thyromegaly.  Respiratory: Normal respiratory effort, Normal percussion  Gastrointestinal:  Normal bowel sounds.  No bruits.  Soft, non-tender and non-distended without masses, hepatosplenomegaly or hernias noted.  No guarding or rebound tenderness.     Cardiac: No clubbing or edema.  No cyanosis. Normal posterior tibial pedal pulses noted.  Lymphatic:  No significant cervical or axillary adenopathy.  Psych:  Alert and cooperative. Normal mood and affect.  Musculoskeletal:  Normal gait. Head normocephalic, atraumatic. Symmetrical without gross deformities. 5/5 Upper and Lower extremity strength bilaterally.  Skin: Warm. Intact without significant lesions or rashes. No jaundice.  Neurologic:  Face symmetrical, tongue midline, Normal sensation to touch;  grossly normal neurologically.  Psych:  Alert and oriented x3, Alert and cooperative. Normal mood and affect.   LAB RESULTS: Recent Labs    12/18/20 0255  WBC  6.2  HGB 11.4*  HCT 35.1*  PLT 198   BMET Recent Labs    12/18/20 0255  NA 138  K 4.1  CL 105  CO2 24  GLUCOSE 94  BUN 22  CREATININE 0.60*  CALCIUM 8.6*   GI panel - 12/06/2020 Celiac panel negative as per HPI Fecal calprotectin 120  STUDIES: No results found.    Impression / Plan:   James Sanford is a 65 y.o. y/o male with admission post fall and for left hip fracture repair, with GI being consulted for fecal incontinence, with history of chronic diarrhea with work-up last year being negative for microscopic colitis, with recent infectious panel negative  Patient has never had fecal pancreatic elastase testing and given his Billroth II gastric bypass anatomy, may have pancreatic insufficiency causing his symptoms  Fecal pancreatic elastase testing has been ordered and is pending, follow-up results  Avoid beverages with sugars or sugar substitutes as this can contribute to loose stools.  Avoid gum or candy as well  Patient is on antidiarrheals at this time as needed.  However, MAR reviewed and patient does not seem to be  getting this medication.  Consider around-the-clock dosing.  Can also try psyllium husk/Metamucil as bulking agent  Patient should follow-up in GI clinic at discharge as well given his chronic diarrhea and this will likely need monitoring outpatient and changes in management as necessary as well  If fecal elastase testing does not become available in the next 1 to 2 days, can consider empiric treatment with pancreatic replacement enzymes to see if it improves his symptoms  Thank you for involving me in the care of this patient.      LOS: 26 days   Pasty Spillers, MD  12/20/2020, 11:28 PM

## 2020-12-21 DIAGNOSIS — R195 Other fecal abnormalities: Secondary | ICD-10-CM | POA: Diagnosis not present

## 2020-12-21 DIAGNOSIS — F321 Major depressive disorder, single episode, moderate: Secondary | ICD-10-CM | POA: Diagnosis not present

## 2020-12-21 DIAGNOSIS — R159 Full incontinence of feces: Secondary | ICD-10-CM | POA: Diagnosis not present

## 2020-12-21 LAB — ALT: ALT: 14 U/L (ref 0–44)

## 2020-12-21 LAB — BASIC METABOLIC PANEL
Anion gap: 4 — ABNORMAL LOW (ref 5–15)
BUN: 21 mg/dL (ref 8–23)
CO2: 26 mmol/L (ref 22–32)
Calcium: 8.4 mg/dL — ABNORMAL LOW (ref 8.9–10.3)
Chloride: 109 mmol/L (ref 98–111)
Creatinine, Ser: 0.44 mg/dL — ABNORMAL LOW (ref 0.61–1.24)
GFR, Estimated: >60 mL/min (ref >60)
Glucose, Bld: 91 mg/dL (ref 70–99)
Potassium: 3.5 mmol/L (ref 3.5–5.1)
Sodium: 139 mmol/L (ref 135–145)

## 2020-12-21 LAB — CBC
HCT: 34.1 % — ABNORMAL LOW (ref 39.0–52.0)
Hemoglobin: 11.2 g/dL — ABNORMAL LOW (ref 13.0–17.0)
MCH: 32.4 pg (ref 26.0–34.0)
MCHC: 32.8 g/dL (ref 30.0–36.0)
MCV: 98.6 fL (ref 80.0–100.0)
Platelets: 171 10*3/uL (ref 150–400)
RBC: 3.46 MIL/uL — ABNORMAL LOW (ref 4.22–5.81)
RDW: 14.2 % (ref 11.5–15.5)
WBC: 5.4 10*3/uL (ref 4.0–10.5)
nRBC: 0 % (ref 0.0–0.2)

## 2020-12-21 LAB — ALKALINE PHOSPHATASE: Alkaline Phosphatase: 130 U/L — ABNORMAL HIGH (ref 38–126)

## 2020-12-21 LAB — AST: AST: 17 U/L (ref 15–41)

## 2020-12-21 LAB — BILIRUBIN, TOTAL: Total Bilirubin: 0.8 mg/dL (ref 0.3–1.2)

## 2020-12-21 MED ORDER — LOPERAMIDE HCL 2 MG PO CAPS
2.0000 mg | ORAL_CAPSULE | Freq: Three times a day (TID) | ORAL | Status: DC
  Administered 2020-12-21 – 2020-12-27 (×15): 2 mg via ORAL
  Filled 2020-12-21 (×15): qty 1

## 2020-12-21 NOTE — Progress Notes (Signed)
PROGRESS NOTE    James Sanford  ZOX:[id number] DOB: 1955/06/25 DOA: 11/23/2020 PCP: Jerl Mina, MD    Brief Narrative:  65 year old man with past medical history of hypertension hyperlipidemia atrial fibrillation and alcohol abuse presents to the hospital after a fall and found to have a left hip fracture.  On 11/25/2020 Dr. Odis Luster did a left intramedullary nail intertrochanteric procedure for left hip fracture.  The patient was seen by psychiatry and they recommended a Geri psych bed for moderate major depression.    Patient was having diarrhea.  This is resolved after administration of cholestyramine.  Stool GI PCR panel negative.  No/low suspicion for C. difficile.  Main ongoing issue has been fecal incontinence.  This is a chronic issue for patient.  Multiple colonoscopies and diagnostic work-up in the past have been inconclusive.  Started Bentyl on 9/11.     Assessment & Plan:   Principal Problem:   Moderate major depression, single episode (HCC) Active Problems:   Alcohol abuse   Diabetes mellitus type 2, uncomplicated (HCC)   AF (paroxysmal atrial fibrillation) (HCC)   Essential hypertension   Closed nondisplaced intertrochanteric fracture of left femur (HCC)   Acute lower UTI   Protein-calorie malnutrition, severe   Malnutrition of moderate degree   Diarrhea   Impaired fasting glucose   Hypokalemia   Iron deficiency anemia  Major depression and initial suicidal ideation.  Patient on fluoxetine.  Awaiting a Geri psych bed.  Unfortunately no bed offers currently. Psych following. Diarrhea with fecal incontinence, chronic.  continues to have loose stools couple of times daily. Had colonoscopy last year w/ biopsy that was not revealing. Has not followed up with GI. Gi pathogen panel negative earlier this hospitalization. B12 normal. Will check celiac panel, fecal calprotectin, and crp (normal). Gi consulted yesterday. 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 Will also make loperamide standing. Consider adding bulking agent as needed. Will need outpt gi f/u Left intertrochanteric hip fracture status post left intramedullary nail fixation on 11/25/2020 by Dr. Odis Luster.  Patient walking well with physical therapy.  Cleared for discharge from orthopedic standpoint. Repeat imaging obtained 9/14 stable and reviewed by ortho, they advise 6 wk f/u in clinic I.e. on or around 11/1 paroxysmal atrial fibrillation on metoprolol for rate control and Eliquis for anticoagulation to prevent stroke. Have reduced dose of metop to 12.5. Alcohol abuse.  Continue thiamine, folic acid and multivitamin. No s/s withdrawal Hypokalemia.  Resolved Iron deficiency anemia.  Stable.  B12 normal range.  Continue oral iron supplementation   DVT prophylaxis: Eliquis Code Status: Full Family Communication: wife updated telephonically 9/18 Disposition Plan: Status is: Inpatient  Remains inpatient appropriate because:Inpatient level of care appropriate due to severity of illness.  Unsafe discharge plan  Dispo: The patient is from: Home              Anticipated d/c is to:  [facility] psych inpatient facility              Patient currently is medically stable to d/c.   Difficult to place patient Yes  Needs Geri psych facility.  Currently no bed offers.     Level of care: Med-Surg  Consultants:  Orthopedics  Procedures:  IM nail for hip fracture 8/23  Antimicrobials: None   Subjective: And examined.  Resting comfortably in chair.  No complaints. No hip pain. Working with PT.  Objective: Vitals:   12/20/20 2005 12/21/20 0422 12/21/20 0745 12/21/20 1247  BP:  104/80 94/63 107/68 105/78  Pulse: 64 60 (!) 56 62  Resp: 17 16 17 16   Temp: 98 F (36.7 C) 98.1 F (36.7 C) 98.4 F (36.9 C) 98.6 F (37 C)  TempSrc:  Oral    SpO2: 100% 100% 100% 99%  Weight:      Height:        Intake/Output Summary (Last 24 hours) at 12/21/2020  1333 Last data filed at 12/21/2020 1034 Gross per 24 hour  Intake 480 ml  Output 825 ml  Net -345 ml    Filed Weights   11/23/20 1438  Weight: 77.1 kg    Examination:  General exam: No acute distress Respiratory system: Lungs clear.  Normal work of breathing.  Room air Cardiovascular system: S1-S2, RRR, no murmurs, no pedal edema Gastrointestinal system: Abdomen is nondistended, soft and nontender. No organomegaly or masses felt. Normal bowel sounds heard. Central nervous system: Alert, oriented x2, no focal deficits GU; normal rectal tone on dre Extremities: Symmetric 5 x 5 power. Skin: No rashes, lesions or ulcers Psychiatry: Judgement and insight appear normal. Mood & affect appropriate.     Data Reviewed: I have personally reviewed following labs and imaging studies  CBC: Recent Labs  Lab 12/15/20 0623 12/18/20 0255 12/21/20 0522  WBC 4.9 6.2 5.4  HGB 11.4* 11.4* 11.2*  HCT 34.6* 35.1* 34.1*  MCV 98.0 96.7 98.6  PLT 238 198 171   Basic Metabolic Panel: Recent Labs  Lab 12/15/20 0623 12/18/20 0255 12/21/20 0522  NA  --  138 139  K  --  4.1 3.5  CL  --  105 109  CO2  --  24 26  GLUCOSE  --  94 91  BUN  --  22 21  CREATININE 0.55* 0.60* 0.44*  CALCIUM  --  8.6* 8.4*   GFR: Estimated Creatinine Clearance: 103.1 mL/min (A) (by C-G formula based on SCr of 0.44 mg/dL (L)). Liver Function Tests: No results for input(s): AST, ALT, ALKPHOS, BILITOT, PROT, ALBUMIN in the last 168 hours. No results for input(s): LIPASE, AMYLASE in the last 168 hours. No results for input(s): AMMONIA in the last 168 hours. Coagulation Profile: No results for input(s): INR, PROTIME in the last 168 hours. Cardiac Enzymes: No results for input(s): CKTOTAL, CKMB, CKMBINDEX, TROPONINI in the last 168 hours. BNP (last 3 results) No results for input(s): PROBNP in the last 8760 hours. HbA1C: No results for input(s): HGBA1C in the last 72 hours. CBG: No results for input(s):  GLUCAP in the last 168 hours.  Lipid Profile: No results for input(s): CHOL, HDL, LDLCALC, TRIG, CHOLHDL, LDLDIRECT in the last 72 hours. Thyroid Function Tests: No results for input(s): TSH, T4TOTAL, FREET4, T3FREE, THYROIDAB in the last 72 hours. Anemia Panel: No results for input(s): VITAMINB12, FOLATE, FERRITIN, TIBC, IRON, RETICCTPCT in the last 72 hours.  Sepsis Labs: No results for input(s): PROCALCITON, LATICACIDVEN in the last 168 hours.  Recent Results (from the past 240 hour(s))  Calprotectin, Fecal     Status: None   Collection Time: 12/17/20  4:15 PM   Specimen: Stool  Result Value Ref Range Status   Calprotectin, Fecal 120 0 - 120 ug/g Final    Comment: (NOTE) Concentration     Interpretation   Follow-Up <16 - 50 ug/g     Normal           None >50 -120 ug/g     Borderline       Re-evaluate in 4-6 weeks    >  120 ug/g     Abnormal         Repeat as clinically                                   indicated Performed At: [facility] [address] [place], [place] [id number] Jolene Schimke MD ZD:[id number]           Radiology Studies: No results found.      Scheduled Meds:  apixaban  5 mg Oral BID   cholecalciferol  1,000 Units Oral Daily   ferrous sulfate  325 mg Oral Q breakfast   FLUoxetine  20 mg Oral Daily   folic acid  1 mg Oral Daily   loperamide  2 mg Oral TID AC   metoprolol succinate  12.5 mg Oral Daily   multivitamin with minerals  1 tablet Oral Daily   potassium chloride  40 mEq Oral Daily   thiamine  100 mg Oral Daily   Continuous Infusions:   LOS: 27 days    Time spent: 20 minutes    Silvano Bilis, MD [facility]   If 7PM-7AM, please contact night-coverage  12/21/2020, 1:33 PM

## 2020-12-21 NOTE — Progress Notes (Addendum)
Melodie Bouillon, MD [address], [address], [place], [place], [address] [address], [address], [place], [place], [address] Phone: [phone]  Fax: [phone]   Subjective: Patient resting in bed comfortably.  Nurse reports that patient had metallic silver looking stool and it has been sent for fecal elastase testing.  She states she also verified with previous staff and patient's stool has been appearing metallic/silver-colored.  Patient's nurse states she researched this and this can be associated with ampulla of vater cancer.   Objective: Exam: Vital signs in last 24 hours: Vitals:   12/20/20 2005 12/21/20 0422 12/21/20 0745 12/21/20 1247  BP: 104/80 94/63 107/68 105/78  Pulse: 64 60 (!) 56 62  Resp: 17 16 17 16   Temp: 98 F (36.7 C) 98.1 F (36.7 C) 98.4 F (36.9 C) 98.6 F (37 C)  TempSrc:  Oral    SpO2: 100% 100% 100% 99%  Weight:      Height:       Weight change:   Intake/Output Summary (Last 24 hours) at 12/21/2020 1609 Last data filed at 12/21/2020 1034 Gross per 24 hour  Intake 480 ml  Output 825 ml  Net -345 ml    Constitutional: General:   Alert,  Well-developed, well-nourished, pleasant and cooperative in NAD BP 105/78 (BP Location: Left Arm)   Pulse 62   Temp 98.6 F (37 C)   Resp 16   Ht 6' (1.829 m)   Wt 77.1 kg   SpO2 99%   BMI 23.06 kg/m   Eyes:  Sclera clear, no icterus.   Conjunctiva pink.   Ears:  No scars, lesions or masses, Normal auditory acuity. Nose:  No deformity, discharge, or lesions. Mouth:  No deformity or lesions, oropharynx pink & moist.  Neck:  Supple; no masses, trachea midline  Respiratory: Normal respiratory effort  Gastrointestinal:  Soft, non-tender and non-distended without masses, hepatosplenomegaly or hernias noted.  No guarding or rebound tenderness.     Cardiac: No clubbing or edema.  No cyanosis. Normal posterior tibial pedal pulses noted.  Lymphatic:  No significant cervical adenopathy.  Psych:   Alert and cooperative. Normal mood and affect.  Musculoskeletal:  Head normocephalic, atraumatic. 5/5 Lower extremity strength bilaterally.  Skin: Warm. Intact without significant lesions or rashes. No jaundice.  Neurologic:  Face symmetrical, tongue midline, Normal sensation to touch  Psych:  Alert and oriented x3, Alert and cooperative. Normal mood and affect.   Lab Results: Lab Results  Component Value Date   WBC 5.4 12/21/2020   HGB 11.2 (L) 12/21/2020   HCT 34.1 (L) 12/21/2020   MCV 98.6 12/21/2020   PLT 171 12/21/2020   Micro Results: Recent Results (from the past 240 hour(s))  Calprotectin, Fecal     Status: None   Collection Time: 12/17/20  4:15 PM   Specimen: Stool  Result Value Ref Range Status   Calprotectin, Fecal 120 0 - 120 ug/g Final    Comment: (NOTE) Concentration     Interpretation   Follow-Up <16 - 50 ug/g     Normal           None >50 -120 ug/g     Borderline       Re-evaluate in 4-6 weeks    >120 ug/g     Abnormal         Repeat as clinically  indicated Performed At: [facility] [address] [place], [place] [id number] Jolene Schimke MD XF:[id number]    Studies/Results: No results found. Medications:  Scheduled Meds:  apixaban  5 mg Oral BID   cholecalciferol  1,000 Units Oral Daily   ferrous sulfate  325 mg Oral Q breakfast   FLUoxetine  20 mg Oral Daily   folic acid  1 mg Oral Daily   loperamide  2 mg Oral TID AC   metoprolol succinate  12.5 mg Oral Daily   multivitamin with minerals  1 tablet Oral Daily   potassium chloride  40 mEq Oral Daily   thiamine  100 mg Oral Daily   Continuous Infusions: PRN Meds:.methocarbamol **OR** [DISCONTINUED] methocarbamol (ROBAXIN) IV, ondansetron **OR** ondansetron (ZOFRAN) IV   Assessment: Principal Problem:   Moderate major depression, single episode (HCC) Active Problems:   Alcohol abuse   Diabetes mellitus type 2, uncomplicated (HCC)   AF (paroxysmal  atrial fibrillation) (HCC)   Essential hypertension   Closed nondisplaced intertrochanteric fracture of left femur (HCC)   Acute lower UTI   Protein-calorie malnutrition, severe   Malnutrition of moderate degree   Diarrhea   Impaired fasting glucose   Hypokalemia   Iron deficiency anemia    Plan: Patient's main complaint is fecal incontinence Await fecal elastase testing which is pending at this time and if abnormal, start Creon  Maisie Fus' sign, has been reported in the past to be associated with ampulla of vater cancer, but he does not have any other clinical features to suggest biliary obstruction or malignancy.  He does not have any jaundice.  I will check liver enzymes and if elevated, can obtain imaging.  If fecal elastase is elevated this could cause fatty appearing stools  Wife at bedside today and also had questions and both pt and wife state even when pt has formed stool he may have gas and bloating and sudden expulsion of stool. However, stool is usually lose. They have tried metamucil but it caused diarrhea  Primary team has imodium around the clock  I discussed with them that he may have some component of pelvic floor dysfunction and pelvic floor PT may help. 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     LOS: 27 days   Melodie Bouillon, MD 12/21/2020, 4:09 PM

## 2020-12-22 DIAGNOSIS — F321 Major depressive disorder, single episode, moderate: Secondary | ICD-10-CM | POA: Diagnosis not present

## 2020-12-22 DIAGNOSIS — R197 Diarrhea, unspecified: Secondary | ICD-10-CM | POA: Diagnosis not present

## 2020-12-22 LAB — CBC
HCT: 34.8 % — ABNORMAL LOW (ref 39.0–52.0)
Hemoglobin: 11.5 g/dL — ABNORMAL LOW (ref 13.0–17.0)
MCH: 32.3 pg (ref 26.0–34.0)
MCHC: 33 g/dL (ref 30.0–36.0)
MCV: 97.8 fL (ref 80.0–100.0)
Platelets: 175 10*3/uL (ref 150–400)
RBC: 3.56 MIL/uL — ABNORMAL LOW (ref 4.22–5.81)
RDW: 14.1 % (ref 11.5–15.5)
WBC: 5.8 10*3/uL (ref 4.0–10.5)
nRBC: 0 % (ref 0.0–0.2)

## 2020-12-22 LAB — BASIC METABOLIC PANEL
Anion gap: 5 (ref 5–15)
BUN: 21 mg/dL (ref 8–23)
CO2: 25 mmol/L (ref 22–32)
Calcium: 8.5 mg/dL — ABNORMAL LOW (ref 8.9–10.3)
Chloride: 109 mmol/L (ref 98–111)
Creatinine, Ser: 0.55 mg/dL — ABNORMAL LOW (ref 0.61–1.24)
GFR, Estimated: >60 mL/min (ref >60)
Glucose, Bld: 95 mg/dL (ref 70–99)
Potassium: 3.8 mmol/L (ref 3.5–5.1)
Sodium: 139 mmol/L (ref 135–145)

## 2020-12-22 NOTE — Progress Notes (Addendum)
Physical Therapy Treatment Patient Details Name: James Sanford MRN: [id number] DOB: 02-Jan-1956 Today's Date: 12/22/2020   History of Present Illness Per MD notes, pt is a 65 y.o. male who presented to the ER via EMS for evaluation of a fall with a left intertrochanteric hip fx and is currently s/p IM nail WBAT LLE. PMH includes alcohol abuse, diabetes mellitus, hypertension, sleep apnea, paroxysmal atrial fibrillation on anticoagulation. MD assessment includes severe depression with suicidal ideation, UTI, alcohol abuse, and displaced, impacted intertrochanteric fracture of the left femur.    PT Comments    Pt asleep, easily awakes to verbal cues. Pt is pleasant, cooperative, and calm throughout therapy. Progressed treatment to incorporate the Dynamic Gait Index to assess higher level balance activities. Pt scored 19/24 indicating fall risk (due to pt utilizing AD which lowers overall DGI score). PT to assess without AD usage in future treatment session. Pt did lose balance x1 with initial steps out of bed, corrected with ankle strategy, but required no physical assist. No other balance deficits noted with gait training, SPC w/  MIN GUARD. Pt did require cues for correct usage and hand placement, but recited correct sequencing without cues for stair navigation. Pt requires SUP w/ BUE on R hand rail, & MOD I w/ SPC for transfers. Discharge recommendations are HHPT. Skilled PT intervention is indicated to address deficits in function, mobility, and to return to PLOF as able.     Recommendations for follow up therapy are one component of a multi-disciplinary discharge planning process, led by the attending physician.  Recommendations may be updated based on patient status, additional functional criteria and insurance authorization.  Follow Up Recommendations  Home health PT     Equipment Recommendations  Cane    Recommendations for Other Services       Precautions / Restrictions  Precautions Precautions: Fall Restrictions Weight Bearing Restrictions: Yes LLE Weight Bearing: Weight bearing as tolerated     Mobility  Bed Mobility Overal bed mobility: Modified Independent Bed Mobility: Rolling;Sidelying to Sit Rolling: Modified independent (Device/Increase time) Sidelying to sit: Modified independent (Device/Increase time)            Transfers Overall transfer level: Modified independent Equipment used: Straight cane Transfers: Sit to/from Stand Sit to Stand: Modified independent (Device/Increase time)         General transfer comment: Able to stand w/o SPC or LOB, SPC provided for stability  Ambulation/Gait Ambulation/Gait assistance: Min guard;Supervision Gait Distance (Feet): 220 Feet Assistive device: Rolling walker (2 wheeled);Straight cane Gait Pattern/deviations: Decreased step length - left;Trunk flexed;Decreased step length - right;Step-through pattern     General Gait Details: amb 180 ft w SPC min-gaurd due to initial LOB corrected with ankle strategy, 40 ft RW, supervision with improvement in gait speed   Stairs   Stairs assistance: Supervision Stair Management: One rail Right;Step to pattern Number of Stairs: 4 General stair comments: no LOB, pt able to verbalize and demonstrate technique without cues   Wheelchair Mobility    Modified Rankin (Stroke Patients Only)       Balance Overall balance assessment: Needs assistance Sitting-balance support: No upper extremity supported;Feet supported Sitting balance-Leahy Scale: Normal     Standing balance support: Single extremity supported;During functional activity Standing balance-Leahy Scale: Fair Standing balance comment: Requires single UE support for dynamic tasks                 Standardized Balance Assessment Standardized Balance Assessment : Dynamic Gait Index   Dynamic Gait Index  Level Surface: Mild Impairment Change in Gait Speed: Mild Impairment Gait  with Horizontal Head Turns: Mild Impairment Gait with Vertical Head Turns: Mild Impairment Gait and Pivot Turn: Mild Impairment Step Over Obstacle: Mild Impairment Step Around Obstacles: Mild Impairment Steps: Moderate Impairment Total Score: 15      Cognition Arousal/Alertness: Awake/alert Behavior During Therapy: WFL for tasks assessed/performed Overall Cognitive Status: Within Functional Limits for tasks assessed                                 General Comments: pleasant, calm, cooperative      Exercises Other Exercises Other Exercises: Gait training w/ reminders for using SPC correctly (side, gait pattern, ect)    General Comments        Pertinent Vitals/Pain Pain Assessment: No/denies pain    Home Living                      Prior Function            PT Goals (current goals can now be found in the care plan section) Progress towards PT goals: Progressing toward goals    Frequency    Min 2X/week      PT Plan Current plan remains appropriate    Co-evaluation              AM-PAC PT "6 Clicks" Mobility   Outcome Measure  Help needed turning from your back to your side while in a flat bed without using bedrails?: None Help needed moving from lying on your back to sitting on the side of a flat bed without using bedrails?: None Help needed moving to and from a bed to a chair (including a wheelchair)?: A Little Help needed standing up from a chair using your arms (e.g., wheelchair or bedside chair)?: A Little Help needed to walk in hospital room?: A Little Help needed climbing 3-5 steps with a railing? : A Little 6 Click Score: 20    End of Session Equipment Utilized During Treatment: Gait belt Activity Tolerance: Patient tolerated treatment well Patient left: in chair;with chair alarm set;with nursing/sitter in room;with call bell/phone within reach Nurse Communication: Mobility status PT Visit Diagnosis: Unsteadiness on  feet (R26.81);Other abnormalities of gait and mobility (R26.89);Muscle weakness (generalized) (M62.81);History of falling (Z91.81) Pain - Right/Left: Left Pain - part of body: Hip     Time: 1610-9604 PT Time Calculation (min) (ACUTE ONLY): 25 min  Charges:                       [employer], SPT

## 2020-12-22 NOTE — Progress Notes (Signed)
Midge Minium, MD [facility]   [address] [place], [place] [address] Phone: [phone] Fax : [phone]   Subjective: This patient has been followed for diarrhea and states that he is now having solid stools.  The patient's workup has been negative including a colonoscopy and upper endoscopy.  The patient was reported to have the diarrhea resolve after being given cholestyramine.  The patient is awaiting a fecal elastase study to look for any sign of steatorrhea.   Objective: Vital signs in last 24 hours: Vitals:   12/21/20 2125 12/22/20 0630 12/22/20 0759 12/22/20 1556  BP: 95/70 110/72 102/74 105/70  Pulse: (!) 56 (!) 55 (!) 56 61  Resp: 16 16 18 18   Temp: 98.1 F (36.7 C) 97.7 F (36.5 C) 97.8 F (36.6 C) 98.1 F (36.7 C)  TempSrc: Oral Oral  Oral  SpO2: 100% 100% 100% 99%  Weight:      Height:       Weight change:   Intake/Output Summary (Last 24 hours) at 12/22/2020 1919 Last data filed at 12/22/2020 1916 Gross per 24 hour  Intake 720 ml  Output 1025 ml  Net -305 ml     Exam: Heart:: Regular rate and rhythm, S1S2 present, or without murmur or extra heart sounds Lungs: normal and clear to auscultation and percussion Abdomen: soft, nontender, normal bowel sounds   Lab Results: @LABTEST2 @ Micro Results: Recent Results (from the past 240 hour(s))  Calprotectin, Fecal     Status: None   Collection Time: 12/17/20  4:15 PM   Specimen: Stool  Result Value Ref Range Status   Calprotectin, Fecal 120 0 - 120 ug/g Final    Comment: (NOTE) Concentration     Interpretation   Follow-Up <16 - 50 ug/g     Normal           None >50 -120 ug/g     Borderline       Re-evaluate in 4-6 weeks    >120 ug/g     Abnormal         Repeat as clinically                                   indicated Performed At: [facility] [address] [place], [address] [place] [id number]    Studies/Results: No results found. Medications: I have  reviewed the patient's current medications. Scheduled Meds:  apixaban  5 mg Oral BID   cholecalciferol  1,000 Units Oral Daily   ferrous sulfate  325 mg Oral Q breakfast   FLUoxetine  20 mg Oral Daily   folic acid  1 mg Oral Daily   loperamide  2 mg Oral TID AC   metoprolol succinate  12.5 mg Oral Daily   multivitamin with minerals  1 tablet Oral Daily   potassium chloride  40 mEq Oral Daily   thiamine  100 mg Oral Daily   Continuous Infusions: PRN Meds:.methocarbamol **OR** [DISCONTINUED] methocarbamol (ROBAXIN) IV, ondansetron **OR** ondansetron (ZOFRAN) IV   Assessment: Principal Problem:   Moderate major depression, single episode (HCC) Active Problems:   Alcohol abuse   Diabetes mellitus type 2, uncomplicated (HCC)   AF (paroxysmal atrial fibrillation) (HCC)   Essential hypertension   Closed nondisplaced intertrochanteric fracture of left femur (HCC)   Acute lower UTI   Protein-calorie malnutrition, severe   Malnutrition of moderate degree   Diarrhea   Impaired fasting glucose  Hypokalemia   Iron deficiency anemia    Plan: This patient had a workup for diarrhea with everything being negative to date with a colonoscopy and upper endoscopy without biopsy showing any cause for diarrhea.  The patient has been doing better on cholestyramine and reports that his diarrhea has resolved.  If the diarrhea returns then a workup for possible bacterial overgrowth should be done since the patient has had always described as a Billroth II procedure on his upper endoscopy. If the fecal elastase is elevated then pancreatic enzymes may be trialed.  The patient should follow up with Dr. Allegra Lai as an outpatient.   LOS: 28 days   Sherlyn Hay 12/22/2020, 7:19 PM Pager [phone] 7am-5pm  Check AMION for 5pm -7am coverage and on weekends

## 2020-12-22 NOTE — Progress Notes (Signed)
PROGRESS NOTE    James Sanford  QMG:[id number] DOB: Dec 18, 1955 DOA: 11/23/2020 PCP: Jerl Mina, MD    Brief Narrative:  65 year old man with past medical history of hypertension hyperlipidemia atrial fibrillation and alcohol abuse presents to the hospital after a fall and found to have a left hip fracture.  On 11/25/2020 Dr. Odis Luster did a left intramedullary nail intertrochanteric procedure for left hip fracture.  The patient was seen by psychiatry and they recommended a Geri psych bed for moderate major depression.    Patient was having diarrhea.  This is resolved after administration of cholestyramine.  Stool GI PCR panel negative.  No/low suspicion for C. difficile.  Main ongoing issue has been fecal incontinence.  This is a chronic issue for patient.  Multiple colonoscopies and diagnostic work-up in the past have been inconclusive.  Started Bentyl on 9/11.     Assessment & Plan:   Principal Problem:   Moderate major depression, single episode (HCC) Active Problems:   Alcohol abuse   Diabetes mellitus type 2, uncomplicated (HCC)   AF (paroxysmal atrial fibrillation) (HCC)   Essential hypertension   Closed nondisplaced intertrochanteric fracture of left femur (HCC)   Acute lower UTI   Protein-calorie malnutrition, severe   Malnutrition of moderate degree   Diarrhea   Impaired fasting glucose   Hypokalemia   Iron deficiency anemia  Major depression and initial suicidal ideation.  Patient on fluoxetine.  Awaiting a Geri psych bed.  Unfortunately no bed offers currently. Psych following. Diarrhea with fecal incontinence, chronic.  continues to have loose stools couple of times daily. Had colonoscopy last year w/ biopsy that was not revealing. Has not followed up with GI. Gi pathogen panel negative earlier this hospitalization. B12 normal. Will check celiac panel, fecal calprotectin, and crp (normal). Gi consulted yesterday. Fecal elastase added, could have pancreatic  insufficiency. Have made loperamide standing. Will need outpt gi f/u, consider anal manometry. Left intertrochanteric hip fracture status post left intramedullary nail fixation on 11/25/2020 by Dr. Odis Luster.  Patient walking well with physical therapy.  Cleared for discharge from orthopedic standpoint. Repeat imaging obtained 9/14 stable and reviewed by ortho, they advise 6 wk f/u in clinic I.e. on or around 11/1 paroxysmal atrial fibrillation on metoprolol for rate control and Eliquis for anticoagulation to prevent stroke. Have reduced dose of metop to 12.5. Alcohol abuse.  Continue thiamine, folic acid and multivitamin. No s/s withdrawal Hypokalemia.  Resolved Iron deficiency anemia.  Stable.  B12 normal range.  Continue oral iron supplementation   DVT prophylaxis: Eliquis Code Status: Full Family Communication: wife updated telephonically 9/18 Disposition Plan: Status is: Inpatient  Remains inpatient appropriate because:Inpatient level of care appropriate due to severity of illness.  Unsafe discharge plan  Dispo: The patient is from: Home              Anticipated d/c is to:  [facility] psych inpatient facility              Patient currently is medically stable to d/c.   Difficult to place patient Yes  Needs Geri psych facility.  Currently no bed offers.     Level of care: Med-Surg  Consultants:  Orthopedics, GI  Procedures:  IM nail for hip fracture 8/23  Antimicrobials: None   Subjective: And examined.  Resting comfortably in chair.  No complaints. Working with PT.  Objective: Vitals:   12/21/20 1736 12/21/20 2125 12/22/20 0630 12/22/20 0759  BP: 99/74 95/70 110/72 102/74  Pulse: 62 (!) 56 (!)  55 (!) 56  Resp: 15 16 16 18   Temp: 98 F (36.7 C) 98.1 F (36.7 C) 97.7 F (36.5 C) 97.8 F (36.6 C)  TempSrc:  Oral Oral   SpO2: 100% 100% 100% 100%  Weight:      Height:        Intake/Output Summary (Last 24 hours) at 12/22/2020 1316 Last data filed at 12/22/2020  0800 Gross per 24 hour  Intake 240 ml  Output 225 ml  Net 15 ml    Filed Weights   11/23/20 1438  Weight: 77.1 kg    Examination:  General exam: No acute distress Respiratory system: Lungs clear.  Normal work of breathing.  Room air Cardiovascular system: S1-S2, RRR, no murmurs, no pedal edema Gastrointestinal system: Abdomen is nondistended, soft and nontender. No organomegaly or masses felt. Normal bowel sounds heard. Central nervous system: Alert, oriented x2, no focal deficits Extremities: Symmetric 5 x 5 power. Skin: No rashes, lesions or ulcers Psychiatry: Judgement and insight appear normal. Mood & affect appropriate.     Data Reviewed: I have personally reviewed following labs and imaging studies  CBC: Recent Labs  Lab 12/18/20 0255 12/21/20 0522 12/22/20 0418  WBC 6.2 5.4 5.8  HGB 11.4* 11.2* 11.5*  HCT 35.1* 34.1* 34.8*  MCV 96.7 98.6 97.8  PLT 198 171 175   Basic Metabolic Panel: Recent Labs  Lab 12/18/20 0255 12/21/20 0522 12/22/20 0418  NA 138 139 139  K 4.1 3.5 3.8  CL 105 109 109  CO2 24 26 25   GLUCOSE 94 91 95  BUN 22 21 21   CREATININE 0.60* 0.44* 0.55*  CALCIUM 8.6* 8.4* 8.5*   GFR: Estimated Creatinine Clearance: 103.1 mL/min (A) (by C-G formula based on SCr of 0.55 mg/dL (L)). Liver Function Tests: Recent Labs  Lab 12/21/20 0522  AST 17  ALT 14  ALKPHOS 130*  BILITOT 0.8   No results for input(s): LIPASE, AMYLASE in the last 168 hours. No results for input(s): AMMONIA in the last 168 hours. Coagulation Profile: No results for input(s): INR, PROTIME in the last 168 hours. Cardiac Enzymes: No results for input(s): CKTOTAL, CKMB, CKMBINDEX, TROPONINI in the last 168 hours. BNP (last 3 results) No results for input(s): PROBNP in the last 8760 hours. HbA1C: No results for input(s): HGBA1C in the last 72 hours. CBG: No results for input(s): GLUCAP in the last 168 hours.  Lipid Profile: No results for input(s): CHOL, HDL,  LDLCALC, TRIG, CHOLHDL, LDLDIRECT in the last 72 hours. Thyroid Function Tests: No results for input(s): TSH, T4TOTAL, FREET4, T3FREE, THYROIDAB in the last 72 hours. Anemia Panel: No results for input(s): VITAMINB12, FOLATE, FERRITIN, TIBC, IRON, RETICCTPCT in the last 72 hours.  Sepsis Labs: No results for input(s): PROCALCITON, LATICACIDVEN in the last 168 hours.  Recent Results (from the past 240 hour(s))  Calprotectin, Fecal     Status: None   Collection Time: 12/17/20  4:15 PM   Specimen: Stool  Result Value Ref Range Status   Calprotectin, Fecal 120 0 - 120 ug/g Final    Comment: (NOTE) Concentration     Interpretation   Follow-Up <16 - 50 ug/g     Normal           None >50 -120 ug/g     Borderline       Re-evaluate in 4-6 weeks    >120 ug/g     Abnormal         Repeat as clinically  indicated Performed At: [facility] [address] [place], [place] [id number] Jolene Schimke MD [id number]           Radiology Studies: No results found.      Scheduled Meds:  apixaban  5 mg Oral BID   cholecalciferol  1,000 Units Oral Daily   ferrous sulfate  325 mg Oral Q breakfast   FLUoxetine  20 mg Oral Daily   folic acid  1 mg Oral Daily   loperamide  2 mg Oral TID AC   metoprolol succinate  12.5 mg Oral Daily   multivitamin with minerals  1 tablet Oral Daily   potassium chloride  40 mEq Oral Daily   thiamine  100 mg Oral Daily   Continuous Infusions:   LOS: 28 days    Time spent: 20 minutes    Silvano Bilis, MD [facility]   If 7PM-7AM, please contact night-coverage  12/22/2020, 1:16 PM

## 2020-12-22 NOTE — Plan of Care (Signed)
No acute events during the night. VSS. Safety maintained.  Problem: Education: Goal: Verbalization of understanding the information provided (i.e., activity precautions, restrictions, etc) will improve Outcome: Progressing Goal: Individualized Educational Video(s) Outcome: Progressing   Problem: Activity: Goal: Ability to ambulate and perform ADLs will improve Outcome: Progressing   Problem: Clinical Measurements: Goal: Postoperative complications will be avoided or minimized Outcome: Progressing   Problem: Self-Concept: Goal: Ability to maintain and perform role responsibilities to the fullest extent possible will improve Outcome: Progressing   Problem: Education: Goal: Knowledge of General Education information will improve Description: Including pain rating scale, medication(s)/side effects and non-pharmacologic comfort measures Outcome: Progressing   Problem: Health Behavior/Discharge Planning: Goal: Ability to manage health-related needs will improve Outcome: Progressing   Problem: Clinical Measurements: Goal: Diagnostic test results will improve Outcome: Progressing Goal: Respiratory complications will improve Outcome: Progressing Goal: Cardiovascular complication will be avoided Outcome: Progressing   Problem: Activity: Goal: Risk for activity intolerance will decrease Outcome: Progressing   Problem: Nutrition: Goal: Adequate nutrition will be maintained Outcome: Progressing   Problem: Coping: Goal: Level of anxiety will decrease Outcome: Progressing   Problem: Elimination: Goal: Will not experience complications related to urinary retention Outcome: Progressing   Problem: Pain Managment: Goal: General experience of comfort will improve Outcome: Progressing   Problem: Safety: Goal: Ability to remain free from injury will improve Outcome: Progressing   Problem: Skin Integrity: Goal: Risk for impaired skin integrity will decrease Outcome:  Progressing   Problem: Education: Goal: Knowledge of General Education information will improve Description: Including pain rating scale, medication(s)/side effects and non-pharmacologic comfort measures Outcome: Progressing   Problem: Health Behavior/Discharge Planning: Goal: Ability to manage health-related needs will improve Outcome: Progressing   Problem: Clinical Measurements: Goal: Ability to maintain clinical measurements within normal limits will improve Outcome: Progressing Goal: Will remain free from infection Outcome: Progressing Goal: Diagnostic test results will improve Outcome: Progressing Goal: Respiratory complications will improve Outcome: Progressing Goal: Cardiovascular complication will be avoided Outcome: Progressing   Problem: Activity: Goal: Risk for activity intolerance will decrease Outcome: Progressing   Problem: Nutrition: Goal: Adequate nutrition will be maintained Outcome: Progressing   Problem: Coping: Goal: Level of anxiety will decrease Outcome: Progressing   Problem: Elimination: Goal: Will not experience complications related to bowel motility Outcome: Progressing Goal: Will not experience complications related to urinary retention Outcome: Progressing   Problem: Pain Managment: Goal: General experience of comfort will improve Outcome: Progressing   Problem: Safety: Goal: Ability to remain free from injury will improve Outcome: Progressing   Problem: Skin Integrity: Goal: Risk for impaired skin integrity will decrease Outcome: Progressing

## 2020-12-23 DIAGNOSIS — F321 Major depressive disorder, single episode, moderate: Secondary | ICD-10-CM | POA: Diagnosis not present

## 2020-12-23 DIAGNOSIS — R197 Diarrhea, unspecified: Secondary | ICD-10-CM | POA: Diagnosis not present

## 2020-12-23 MED ORDER — FERROUS SULFATE 325 (65 FE) MG PO TABS
325.0000 mg | ORAL_TABLET | ORAL | Status: DC
  Administered 2020-12-25 – 2020-12-27 (×2): 325 mg via ORAL
  Filled 2020-12-23 (×3): qty 1

## 2020-12-23 NOTE — Accreditation Note (Signed)
James Minium, MD [facility]   [address], [address] [place], [place] [address] Phone: [phone] Fax : [phone]   Subjective: The patient reports that his stools are firming up and is not having diarrhea.  I saw his stools today and they were brown in color without any sign of bleeding.  The patient has had extensive workup for his diarrhea and is doing better since being put on cholestyramine.   Objective: Vital signs in last 24 hours: Vitals:   12/22/20 2100 12/23/20 0842 12/23/20 1554 12/23/20 2101  BP: 101/77 99/75 101/77 104/70  Pulse: 60 66 61 63  Resp: 17 15 16 18   Temp: 98 F (36.7 C) 98.4 F (36.9 C) 97.9 F (36.6 C) 98 F (36.7 C)  TempSrc: Oral Oral Oral Oral  SpO2: 98% 99% 100% 99%  Weight:      Height:       Weight change:   Intake/Output Summary (Last 24 hours) at 12/23/2020 2128 Last data filed at 12/23/2020 1820 Gross per 24 hour  Intake 120 ml  Output 1225 ml  Net -1105 ml     Exam: Heart:: Regular rate and rhythm, S1S2 present, or without murmur or extra heart sounds Lungs: normal and clear to auscultation and percussion Abdomen: soft, nontender, normal bowel sounds   Lab Results: @LABTEST2 @ Micro Results: Recent Results (from the past 240 hour(s))  Calprotectin, Fecal     Status: None   Collection Time: 12/17/20  4:15 PM   Specimen: Stool  Result Value Ref Range Status   Calprotectin, Fecal 120 0 - 120 ug/g Final    Comment: (NOTE) Concentration     Interpretation   Follow-Up <16 - 50 ug/g     Normal           None >50 -120 ug/g     Borderline       Re-evaluate in 4-6 weeks    >120 ug/g     Abnormal         Repeat as clinically                                   indicated Performed At: [facility] [address] [place], [address] [place] [id number]    Studies/Results: No results found. Medications: I have reviewed the patient's current medications. Scheduled Meds:  apixaban  5 mg Oral BID    cholecalciferol  1,000 Units Oral Daily   [START ON 12/25/2020] ferrous sulfate  325 mg Oral QODAY   FLUoxetine  20 mg Oral Daily   folic acid  1 mg Oral Daily   loperamide  2 mg Oral TID AC   metoprolol succinate  12.5 mg Oral Daily   multivitamin with minerals  1 tablet Oral Daily   potassium chloride  40 mEq Oral Daily   thiamine  100 mg Oral Daily   Continuous Infusions: PRN Meds:.methocarbamol **OR** [DISCONTINUED] methocarbamol (ROBAXIN) IV, ondansetron **OR** ondansetron (ZOFRAN) IV   Assessment: Principal Problem:   Moderate major depression, single episode (HCC) Active Problems:   Alcohol abuse   Diabetes mellitus type 2, uncomplicated (HCC)   AF (paroxysmal atrial fibrillation) (HCC)   Essential hypertension   Closed nondisplaced intertrochanteric fracture of left femur (HCC)   Acute lower UTI   Protein-calorie malnutrition, severe   Malnutrition of moderate degree   Diarrhea   Impaired fasting glucose   Hypokalemia   Iron deficiency anemia  Plan: The patient's GI workup has been negative and he has had more firm stools.  No further GI workup is going to be undertaken at this time.  There is no sign of any GI bleeding. His hemoglobin is stable.  The patient should follow-up with his gastrologist upon discharge.  I will sign off.  Please call if any further GI concerns or questions.  We would like to thank you for the opportunity to participate in the care of James Sanford.    LOS: 29 days   Sherlyn Hay 12/23/2020, 9:28 PM Pager [phone] 7am-5pm  Check AMION for 5pm -7am coverage and on weekends

## 2020-12-23 NOTE — Progress Notes (Signed)
Nutrition Follow-up  DOCUMENTATION CODES:  Non-severe (moderate) malnutrition in context of social or environmental circumstances  INTERVENTION:  Encourage PO intake.  Continue MVI, thiamine, and folic acid daily.  Continue Magic cup TID.  Obtain updated weight.  NUTRITION DIAGNOSIS:  Moderate Malnutrition (in the context of social/environmental circumstances) related to  (excessive EtOH intake and hx of bariatric surgery) as evidenced by mild fat depletion, moderate fat depletion, mild muscle depletion, moderate muscle depletion, severe muscle depletion. - ongoing  GOAL:  Patient will meet greater than or equal to 90% of their needs - progressing  MONITOR:  PO intake, Labs, Weight trends, Skin, Supplement acceptance  REASON FOR ASSESSMENT:  Consult Hip fracture protocol  ASSESSMENT:  65 yo male with a PMH of alcohol abuse, DM, HTN, HLD, hx of gastric sleeve, and atrial fibrillation presented to ED with pain after several falls at home while intoxicated. Non-ambulatory at baseline but family reports he attempts to walk when drinking.  Pt still having diarrhea - no/low suspicion for C. Difficile. Main ongoing issue.  Awaiting discharge to geriatric psych - no beds available at this time.  Pt's intake remains stable and consistent.  Pt due for new weight - has not been weighed in about a month. RD to order.  Average Meal Intake: 8/23-8/29: 79% intake x 15 recorded meals (40-100%) 8/30-9/6: 91% intake x 8 recorded meals (50-100%) 9/7-9/13: 73% intake x 13 recorded meals (50-100%)  9/13-9/20: 76% intake x 9 recorded meals (50-100%)  Medications: reviewed; Vitamin D3, ferrous sulfate every other day, folic acid, imodium TID, MVI with minerals, Klor-Con 40 mEq, thiamine  Labs: reviewed 12/17/20: Vitamin B6: 4.1 (WNL) Zinc: 62 (WNL) Folate: 6.3 (WNL) Vitamin D, 25-Hydroxy: 13.21 (L)  Diet Order:   Diet Order             Diet regular Room service appropriate? Yes;  Fluid consistency: Thin  Diet effective now                  EDUCATION NEEDS:  Education needs have been addressed  Skin:  Skin Assessment: Reviewed RN Assessment (surgical incisions to the left hip/leg, bruising to bilateral arms)  Last BM:  12/22/20 - Type 6, medium  Height:  Ht Readings from Last 1 Encounters:  11/23/20 6' (1.829 m)   Weight:  Wt Readings from Last 1 Encounters:  11/23/20 77.1 kg   Ideal Body Weight:  80.9 kg  BMI:  Body mass index is 23.06 kg/m.  Estimated Nutritional Needs:  Kcal:  2200-2400 Protein:  110-120 g/d Fluid:  >2.3 L/d  Vertell Limber, RD, LDN (she/her/hers) Registered Dietitian I After-Hours/Weekend Pager # in [place]

## 2020-12-23 NOTE — Progress Notes (Signed)
PROGRESS NOTE    James Sanford  MWU:[id number] DOB: [birth year] DOA: 11/23/2020 PCP: Jerl Mina, MD    Brief Narrative:  65 year old man with past medical history of hypertension hyperlipidemia atrial fibrillation and alcohol abuse presents to the hospital after a fall and found to have a left hip fracture.  On 11/25/2020 Dr. Odis Luster did a left intramedullary nail intertrochanteric procedure for left hip fracture.  The patient was seen by psychiatry and they recommended a Geri psych bed for moderate major depression.    Patient was having diarrhea.  This is resolved after administration of cholestyramine.  Stool GI PCR panel negative.  No/low suspicion for C. difficile.  Main ongoing issue has been fecal incontinence.  This is a chronic issue for patient.  Multiple colonoscopies and diagnostic work-up in the past have been inconclusive.  Started Bentyl on 9/11.     Assessment & Plan:   Principal Problem:   Moderate major depression, single episode (HCC) Active Problems:   Alcohol abuse   Diabetes mellitus type 2, uncomplicated (HCC)   AF (paroxysmal atrial fibrillation) (HCC)   Essential hypertension   Closed nondisplaced intertrochanteric fracture of left femur (HCC)   Acute lower UTI   Protein-calorie malnutrition, severe   Malnutrition of moderate degree   Diarrhea   Impaired fasting glucose   Hypokalemia   Iron deficiency anemia  Major depression and initial suicidal ideation.  Patient on fluoxetine.  Awaiting a Geri psych bed.  Unfortunately no bed offers currently. Psych following. Diarrhea with fecal incontinence, chronic. Pt reports this is improving. Had colonoscopy last year w/ biopsy that was not revealing. Has not followed up with GI. Gi pathogen panel negative earlier this hospitalization. B12 normal. Celiac panel neg. Gi consulted . Fecal elastase added, could have pancreatic insufficiency. Have made loperamide standing and this seems to be helping. Will need  outpt gi f/u, consider anal manometry. Add pancreatic enzymes if elastase elevated Left intertrochanteric hip fracture status post left intramedullary nail fixation on 11/25/2020 by Dr. Odis Luster.  Patient walking well with physical therapy.  Cleared for discharge from orthopedic standpoint. Repeat imaging obtained 9/14 stable and reviewed by ortho, they advise 6 wk f/u in clinic I.e. on or around 11/1 paroxysmal atrial fibrillation on metoprolol for rate control and Eliquis for anticoagulation to prevent stroke. Have reduced dose of metop to 12.5. Alcohol abuse.  Continue thiamine, folic acid and multivitamin. No s/s withdrawal Hypokalemia.  Resolved Iron deficiency anemia.  Stable.  B12 normal range.  Continue oral iron supplementation   DVT prophylaxis: Eliquis Code Status: Full Family Communication: wife updated telephonically 9/18. No answer when called today (she is undergoing hand surgery today) Disposition Plan: Status is: Inpatient  Remains inpatient appropriate because:Inpatient level of care appropriate due to severity of illness.  Unsafe discharge plan  Dispo: The patient is from: Home              Anticipated d/c is to:  [facility] psych inpatient facility              Patient currently is medically stable to d/c.   Difficult to place patient Yes  Needs Geri psych facility.  Currently no bed offers.     Level of care: Med-Surg  Consultants:  Orthopedics, GI  Procedures:  IM nail for hip fracture 8/23  Antimicrobials: None   Subjective: Resting comfortably in chair.  No complaints. Working with PT.  Objective: Vitals:   12/22/20 1150 12/22/20 1556 12/22/20 2100 12/23/20 0842  BP: 100/77  105/70 101/77 99/75  Pulse: 61 61 60 66  Resp: 18 18 17 15   Temp: 98.1 F (36.7 C) 98.1 F (36.7 C) 98 F (36.7 C) 98.4 F (36.9 C)  TempSrc:  Oral Oral Oral  SpO2: 100% 99% 98% 99%  Weight:      Height:        Intake/Output Summary (Last 24 hours) at 12/23/2020 1221 Last  data filed at 12/23/2020 1204 Gross per 24 hour  Intake 600 ml  Output 1425 ml  Net -825 ml    Filed Weights   11/23/20 1438  Weight: 77.1 kg    Examination:  General exam: No acute distress Respiratory system: Lungs clear.  Normal work of breathing.  Room air Cardiovascular system: S1-S2, RRR, no murmurs, no pedal edema Gastrointestinal system: Abdomen is nondistended, soft and nontender. No organomegaly or masses felt. Normal bowel sounds heard. Central nervous system: Alert, oriented x2, no focal deficits Extremities: Symmetric 5 x 5 power. Skin: No rashes, lesions or ulcers Psychiatry: Judgement and insight appear normal. Mood & affect appropriate.     Data Reviewed: I have personally reviewed following labs and imaging studies  CBC: Recent Labs  Lab 12/18/20 0255 12/21/20 0522 12/22/20 0418  WBC 6.2 5.4 5.8  HGB 11.4* 11.2* 11.5*  HCT 35.1* 34.1* 34.8*  MCV 96.7 98.6 97.8  PLT 198 171 175   Basic Metabolic Panel: Recent Labs  Lab 12/18/20 0255 12/21/20 0522 12/22/20 0418  NA 138 139 139  K 4.1 3.5 3.8  CL 105 109 109  CO2 24 26 25   GLUCOSE 94 91 95  BUN 22 21 21   CREATININE 0.60* 0.44* 0.55*  CALCIUM 8.6* 8.4* 8.5*   GFR: Estimated Creatinine Clearance: 103.1 mL/min (A) (by C-G formula based on SCr of 0.55 mg/dL (L)). Liver Function Tests: Recent Labs  Lab 12/21/20 0522  AST 17  ALT 14  ALKPHOS 130*  BILITOT 0.8   No results for input(s): LIPASE, AMYLASE in the last 168 hours. No results for input(s): AMMONIA in the last 168 hours. Coagulation Profile: No results for input(s): INR, PROTIME in the last 168 hours. Cardiac Enzymes: No results for input(s): CKTOTAL, CKMB, CKMBINDEX, TROPONINI in the last 168 hours. BNP (last 3 results) No results for input(s): PROBNP in the last 8760 hours. HbA1C: No results for input(s): HGBA1C in the last 72 hours. CBG: No results for input(s): GLUCAP in the last 168 hours.  Lipid Profile: No results  for input(s): CHOL, HDL, LDLCALC, TRIG, CHOLHDL, LDLDIRECT in the last 72 hours. Thyroid Function Tests: No results for input(s): TSH, T4TOTAL, FREET4, T3FREE, THYROIDAB in the last 72 hours. Anemia Panel: No results for input(s): VITAMINB12, FOLATE, FERRITIN, TIBC, IRON, RETICCTPCT in the last 72 hours.  Sepsis Labs: No results for input(s): PROCALCITON, LATICACIDVEN in the last 168 hours.  Recent Results (from the past 240 hour(s))  Calprotectin, Fecal     Status: None   Collection Time: 12/17/20  4:15 PM   Specimen: Stool  Result Value Ref Range Status   Calprotectin, Fecal 120 0 - 120 ug/g Final    Comment: (NOTE) Concentration     Interpretation   Follow-Up <16 - 50 ug/g     Normal           None >50 -120 ug/g     Borderline       Re-evaluate in 4-6 weeks    >120 ug/g     Abnormal         Repeat  as clinically                                   indicated Performed At: [facility] [address] [place], [place] [id number] Jolene Schimke MD KA:[id number]           Radiology Studies: No results found.      Scheduled Meds:  apixaban  5 mg Oral BID   cholecalciferol  1,000 Units Oral Daily   ferrous sulfate  325 mg Oral Q breakfast   FLUoxetine  20 mg Oral Daily   folic acid  1 mg Oral Daily   loperamide  2 mg Oral TID AC   metoprolol succinate  12.5 mg Oral Daily   multivitamin with minerals  1 tablet Oral Daily   potassium chloride  40 mEq Oral Daily   thiamine  100 mg Oral Daily   Continuous Infusions:   LOS: 29 days    Time spent: 20 minutes    Silvano Bilis, MD [facility]   If 7PM-7AM, please contact night-coverage  12/23/2020, 12:21 PM

## 2020-12-24 LAB — BASIC METABOLIC PANEL
Anion gap: 7 (ref 5–15)
BUN: 22 mg/dL (ref 8–23)
CO2: 25 mmol/L (ref 22–32)
Calcium: 8.7 mg/dL — ABNORMAL LOW (ref 8.9–10.3)
Chloride: 106 mmol/L (ref 98–111)
Creatinine, Ser: 0.54 mg/dL — ABNORMAL LOW (ref 0.61–1.24)
GFR, Estimated: >60 mL/min (ref >60)
Glucose, Bld: 90 mg/dL (ref 70–99)
Potassium: 4.1 mmol/L (ref 3.5–5.1)
Sodium: 138 mmol/L (ref 135–145)

## 2020-12-24 LAB — PANCREATIC ELASTASE, FECAL: Pancreatic Elastase-1, Stool: 107 ug Elast./g — ABNORMAL LOW (ref >200)

## 2020-12-24 MED ORDER — ACETAMINOPHEN 500 MG PO TABS: 500.0000 mg | ORAL_TABLET | Freq: Four times a day (QID) | ORAL | Status: DC | PRN

## 2020-12-24 NOTE — Progress Notes (Signed)
Vitals entered manually ° °

## 2020-12-24 NOTE — Progress Notes (Signed)
PROGRESS NOTE  James Sanford    DOB: 1956/01/01, 65 y.o.  BSW:[id number]  PCP: Jerl Mina, MD   Code Status: Full Code   DOA: 11/23/2020   LOS: 30  Brief Narrative of Current Hospitalization  James Sanford is a 65 y.o. male with a PMH significant for HTN, HLD, A. fib, alcohol dependence. They presented from home to the ED on 11/23/2020 with fall. In the ED, it was found that they had left hip fracture. They were treated with intramedullary nail intertrochanteric procedure.  He was also seen by psych for moderate major depression and recommended for geriatric psych inpatient treatment. Patient was admitted to medicine service for further workup and management of chronic medical management and subacute recovery from hip fracture as outlined in detail below.  12/24/20 -doing well  Assessment & Plan  Principal Problem:   Moderate major depression, single episode (HCC) Active Problems:   Alcohol abuse   Diabetes mellitus type 2, uncomplicated (HCC)   AF (paroxysmal atrial fibrillation) (HCC)   Essential hypertension   Closed nondisplaced intertrochanteric fracture of left femur (HCC)   Acute lower UTI   Protein-calorie malnutrition, severe   Malnutrition of moderate degree   Diarrhea   Impaired fasting glucose   Hypokalemia   Iron deficiency anemia  Chronic major depression- stable.  - follow up with psychiatry to reevaluate for discharge planning - continue fluoxetine -Stable for discharge to geriatric psych unit when a bed is available  Chronic diarrhea- GI signed off yesterday as stool have stabilized.  - continue loperamide and decreased use as tolerated - follow up GI outpatient  L intertrochanteric hip fracture- cleared for discharge by ortho who has signed off - continue PT - follow up ortho OP around 11/1 -Tylenol as needed for pain  pAF- stable - continue metoprolol - continue eliquis  Alcohol dependence- stable. No withdrawal symptoms  currently - continue nutritional support  Hypokalemia- resolved. K+ 4.1 today  Iron deficiency anemia- stable hemoglobin 11.5 yesterday -CBC weekly -Continue oral iron supplementation and bowel regiment  DVT prophylaxis: Place and maintain sequential compression device Start: 11/26/20 1356 SCDs Start: 11/25/20 2141 SCDs Start: 11/24/20 1407 apixaban (ELIQUIS) tablet 5 mg   Diet:  Diet Orders (From admission, onward)     Start     Ordered   11/25/20 1621  Diet regular Room service appropriate? Yes; Fluid consistency: Thin  Diet effective now       Question Answer Comment  Room service appropriate? Yes   Fluid consistency: Thin      11/25/20 1620            Subjective 12/24/20    Pt reports no concerns today.  Disposition Plan & Communication  Status is: Inpatient  Remains inpatient appropriate because:Unsafe d/c plan  Dispo: The patient is from: Home              Anticipated d/c is to:  Geriatric psych unit              Patient currently is medically stable to d/c.   Difficult to place patient Yes  Family Communication: Wife  Consults, Procedures, Significant Events  Consultants:  Psychiatry GI Ortho  Procedures/significant events:  L Hip fixation  Antimicrobials:  Anti-infectives (From admission, onward)    Start     Dose/Rate Route Frequency Ordered Stop   11/25/20 2230  ceFAZolin (ANCEF) IVPB 1 g/50 mL premix        1 g 100 mL/hr over 30 Minutes Intravenous  Every 6 hours 11/25/20 2140 11/26/20 1204   11/25/20 0600  ceFAZolin (ANCEF) IVPB 2g/100 mL premix        2 g 200 mL/hr over 30 Minutes Intravenous 30 min pre-op 11/24/20 1523 11/25/20 1440   11/24/20 1500  cefTRIAXone (ROCEPHIN) 1 g in sodium chloride 0.9 % 100 mL IVPB  Status:  Discontinued        1 g 200 mL/hr over 30 Minutes Intravenous Every 24 hours 11/24/20 1448 11/27/20 1438   11/24/20 1300  cephALEXin (KEFLEX) capsule 500 mg        500 mg Oral  Once 11/24/20 1256 11/24/20 1313    11/24/20 0000  cephALEXin (KEFLEX) 500 MG capsule        500 mg Oral 3 times daily 11/24/20 1257 12/01/20 2359        Objective   Vitals:   12/22/20 2100 12/23/20 0842 12/23/20 1554 12/23/20 2101  BP: 101/77 99/75 101/77 104/70  Pulse: 60 66 61 63  Resp: 17 15 16 18   Temp: 98 F (36.7 C) 98.4 F (36.9 C) 97.9 F (36.6 C) 98 F (36.7 C)  TempSrc: Oral Oral Oral Oral  SpO2: 98% 99% 100% 99%  Weight:      Height:        Intake/Output Summary (Last 24 hours) at 12/24/2020 0738 Last data filed at 12/24/2020 0345 Gross per 24 hour  Intake 120 ml  Output 1725 ml  Net -1605 ml   Filed Weights   11/23/20 1438  Weight: 77.1 kg    Patient BMI: Body mass index is 23.06 kg/m.   Physical Exam: General: awake, alert, NAD HEENT: atraumatic, clear conjunctiva, anicteric sclera, moist mucus membranes, hearing grossly normal Respiratory: normal respiratory effort. Cardiovascular: normal S1/S2,  RRR, no JVD, murmurs, rubs, gallops, quick capillary refill  Gastrointestinal: soft, NT, ND, no HSM felt, +bowel sounds. Nervous: A&O x3. no gross focal neurologic deficits, normal speech Extremities: moves all equally, no edema, normal tone Skin: dry, intact, normal temperature, normal color, No rashes, lesions or ulcers Psychiatry: normal mood, congruent affect, judgement and insight appear normal  Labs   I have personally reviewed following labs and imaging studies No results displayed because visit has over 200 results.       Recent Results (from the past 240 hour(s))  Calprotectin, Fecal     Status: None   Collection Time: 12/17/20  4:15 PM   Specimen: Stool  Result Value Ref Range Status   Calprotectin, Fecal 120 0 - 120 ug/g Final    Comment: (NOTE) Concentration     Interpretation   Follow-Up <16 - 50 ug/g     Normal           None >50 -120 ug/g     Borderline       Re-evaluate in 4-6 weeks    >120 ug/g     Abnormal         Repeat as clinically                                    indicated Performed At: [facility] [address] [place], [place] [id number] MD Jolene Schimke      Imaging Studies  No results found. Medications   Scheduled Meds:  apixaban  5 mg Oral BID   cholecalciferol  1,000 Units Oral Daily   [START ON 12/25/2020] ferrous sulfate  325 mg Oral QODAY  FLUoxetine  20 mg Oral Daily   folic acid  1 mg Oral Daily   loperamide  2 mg Oral TID AC   metoprolol succinate  12.5 mg Oral Daily   multivitamin with minerals  1 tablet Oral Daily   potassium chloride  40 mEq Oral Daily   thiamine  100 mg Oral Daily   Continuous Infusions:   LOS: 30 days   Time spent: >35 min   Leeroy Bock, DO [facility] 12/24/2020, 7:38 AM   To contact the [facility] Attending or Consulting provider for this patient: Check the care team in [facility] for a) attending/consulting TRH provider listed and b) the [facility] team listed Log into [email or web address] and use [facility]'s universal password to access. If you do not have the password, please contact the hospital operator. Locate the [facility] provider you are looking for under [facility] and page to a number that you can be directly reached. If you still have difficulty reaching the provider, please page the [facility] (Director on Call) for the Hospitalists listed on amion for assistance.

## 2020-12-24 NOTE — Progress Notes (Addendum)
Physical Therapy Treatment Patient Details Name: James Sanford MRN: [id number] DOB: Jul 28, 1955 Today's Date: 12/24/2020   History of Present Illness Per MD notes, pt is a 65 y.o. male who presented to the ER via EMS for evaluation of a fall with a left intertrochanteric hip fx and is currently s/p IM nail WBAT LLE. PMH includes alcohol abuse, diabetes mellitus, hypertension, sleep apnea, paroxysmal atrial fibrillation on anticoagulation. MD assessment includes severe depression with suicidal ideation, UTI, alcohol abuse, and displaced, impacted intertrochanteric fracture of the left femur.    PT Comments    Pt alert, seated in recliner, pleasant and cooperative throughout therapy session. Pt transfers w/ MOD I using BUE to push from chair and then utilizing [facility] for stability. Ambulated w/ MIN G w/ SPC due to LOB x 1 recovered w/ stepping strategy. No other LOB noted during session. Utilized SPC during DGI w/ pt scoring 18/24. Pt also ambulated w/ RW requiring only MOD I. Care team notified to order RW prior to d/c to improve stability with amb particularly for community distances. No further PT needs at this time, d/c recommendations update with care team, and pt placed on mobility tech caseload. Family also contacted about updated AD needs (RW at DC, case management aware)   Recommendations for follow up therapy are one component of a multi-disciplinary discharge planning process, led by the attending physician.  Recommendations may be updated based on patient status, additional functional criteria and insurance authorization.  Follow Up Recommendations  No PT follow up     Equipment Recommendations  Rolling walker with 5" wheels    Recommendations for Other Services       Precautions / Restrictions Precautions Precautions: Fall Restrictions Weight Bearing Restrictions: Yes LLE Weight Bearing: Weight bearing as tolerated     Mobility  Bed Mobility                General bed mobility comments: Pt received in recliner    Transfers Overall transfer level: Modified independent Equipment used: Straight cane Transfers: Sit to/from Stand Sit to Stand: Modified independent (Device/Increase time)         General transfer comment: Utilized BUE to push off chair without LOB then utilized [facility] for stability  Ambulation/Gait Ambulation/Gait assistance: Min guard;Modified independent (Device/Increase time) Gait Distance (Feet): 300 Feet Assistive device: Rolling walker (2 wheeled);Straight cane Gait Pattern/deviations: Decreased step length - left;Trunk flexed;Decreased step length - right;Step-through pattern Gait velocity: decreased   General Gait Details: MIN GAURD w/ SPC due to LOB x1, recovered with stepping strategy without PT assist,  SUPV w/ RW   Stairs   Stairs assistance: Supervision Stair Management: One rail Right Number of Stairs: 6 General stair comments: SUP 1 step w/ RW to simulate a curb (no handrail); 1 step w/ SPC to assess overall stability, MIN-G (no hand rail); 4 steps BUE to R handrail UPV   Wheelchair Mobility    Modified Rankin (Stroke Patients Only)       Balance Overall balance assessment: Needs assistance Sitting-balance support: No upper extremity supported;Feet supported Sitting balance-Leahy Scale: Normal     Standing balance support: Single extremity supported;During functional activity Standing balance-Leahy Scale: Fair Standing balance comment: Requires single UE support for dynamic tasks                 Standardized Balance Assessment Standardized Balance Assessment : Dynamic Gait Index   Dynamic Gait Index Level Surface: Mild Impairment Change in Gait Speed: Mild Impairment Gait with Horizontal Head  Turns: Mild Impairment Gait with Vertical Head Turns: Mild Impairment Gait and Pivot Turn: Normal Step Over Obstacle: Normal Step Around Obstacles: Normal Steps: Moderate Impairment Total  Score: 18      Cognition Arousal/Alertness: Awake/alert Behavior During Therapy: WFL for tasks assessed/performed Overall Cognitive Status: Within Functional Limits for tasks assessed                                 General Comments: Oriented x 4, pleasant, cooperative      Exercises      General Comments        Pertinent Vitals/Pain Pain Assessment: No/denies pain    Home Living                      Prior Function            PT Goals (current goals can now be found in the care plan section) Progress towards PT goals: Progressing toward goals    Frequency    Min 2X/week      PT Plan Discharge plan needs to be updated    Co-evaluation              AM-PAC PT "6 Clicks" Mobility   Outcome Measure  Help needed turning from your back to your side while in a flat bed without using bedrails?: None Help needed moving from lying on your back to sitting on the side of a flat bed without using bedrails?: None Help needed moving to and from a bed to a chair (including a wheelchair)?: None Help needed standing up from a chair using your arms (e.g., wheelchair or bedside chair)?: None Help needed to walk in hospital room?: A Little Help needed climbing 3-5 steps with a railing? : A Little 6 Click Score: 22    End of Session Equipment Utilized During Treatment: Gait belt Activity Tolerance: Patient tolerated treatment well Patient left: in chair;with chair alarm set;with call bell/phone within reach Nurse Communication: Mobility status PT Visit Diagnosis: Unsteadiness on feet (R26.81);Other abnormalities of gait and mobility (R26.89);Muscle weakness (generalized) (M62.81);History of falling (Z91.81) Pain - Right/Left: Right Pain - part of body: Hip     Time: 7939-0300 PT Time Calculation (min) (ACUTE ONLY): 41 min  Charges:                        [employer], SPT

## 2020-12-25 DIAGNOSIS — K591 Functional diarrhea: Secondary | ICD-10-CM

## 2020-12-25 DIAGNOSIS — E440 Moderate protein-calorie malnutrition: Secondary | ICD-10-CM

## 2020-12-25 NOTE — TOC Progression Note (Addendum)
Transition of Care [facility]) - Progression Note    Patient Details  Name: James Sanford MRN: [id number] Date of Birth: [birth year]  Transition of [facility] CM/SW Contact  Barrie Dunker, RN Phone Number: 12/25/2020, 3:44 PM  Clinical Narrative:   Coralyn Pear out to [facility] and requested that they review the patient for a potential Psych bed, Spoke to May in admission, I explained the need to her, she will call me back,, they will allow a patient to use a rolling walker, he will have to be able to do his own ADLs and will have to be IVC if they accept the patient, she stated that she will contact the physician there and see if they can accept him, she called me back and stated that they will accept him as long as he is able to do own ADLs and is Ivcd, Faxed the referral to [phone], called the sheriff's department to schedule transport to [facility] for tomorrow, they are anticipating being here at 10 AM         Expected Discharge Plan and Services                                                 Social Determinants of Health (SDOH) Interventions    Readmission Risk Interventions No flowsheet data found.

## 2020-12-25 NOTE — Progress Notes (Signed)
Mobility Specialist - Progress Note   12/25/20 1700  Mobility  Activity Ambulated in hall  Level of Assistance Modified independent, requires aide device or extra time  Assistive Device Front wheel walker  Distance Ambulated (ft) 500 ft  Mobility Ambulated independently in hallway  Mobility Response Tolerated well  Mobility performed by Mobility specialist  $Mobility charge 1 Mobility    Pt ambulated in hallway modI. No complaints.    Filiberto Pinks Mobility Specialist 12/25/20, 5:28 PM

## 2020-12-25 NOTE — Consult Note (Signed)
[facility] Face-to-Face Psychiatry Consult   Reason for Consult: Follow-up for 65 year old man with history of depression and alcohol abuse Referring Physician: Dareen Piano Patient Identification: James Sanford MRN:  [id number] Principal Diagnosis: Moderate major depression, single episode (HCC) Diagnosis:  Principal Problem:   Moderate major depression, single episode (HCC) Active Problems:   Alcohol abuse   Diabetes mellitus type 2, uncomplicated (HCC)   AF (paroxysmal atrial fibrillation) (HCC)   Essential hypertension   Closed nondisplaced intertrochanteric fracture of left femur (HCC)   Acute lower UTI   Protein-calorie malnutrition, severe   Malnutrition of moderate degree   Diarrhea   Impaired fasting glucose   Hypokalemia   Iron deficiency anemia   Total Time spent with patient: 15 minutes  Subjective:   James Sanford is a 65 y.o. male patient admitted with "I feel fine".  HPI: Patient continues to deny being depressed.  Wife continues to have valid concerns about his long history of depression and frequent suicide attempts at home and alcohol abuse all of which he continues to deny or minimize.  Patient has not had significant behavior problems on the medical service.  Denies any suicidal ideation  Past Psychiatric History: Has never cooperated with treatment although he has been taking medicine this time in the hospital  Risk to Self:   Risk to Others:   Prior Inpatient Therapy:   Prior Outpatient Therapy:    Past Medical History:  Past Medical History:  Diagnosis Date   Arthritis    Diabetes (HCC)    Hyperlipemia    Hypertension    Sleep apnea     Past Surgical History:  Procedure Laterality Date   COLONOSCOPY WITH PROPOFOL N/A 01/28/2020   Procedure: COLONOSCOPY WITH PROPOFOL;  Surgeon: Toney Reil, MD;  Location: [facility] ENDOSCOPY;  Service: Gastroenterology;  Laterality: N/A;   COLONOSCOPY WITH PROPOFOL N/A 01/28/2020   Procedure:  COLONOSCOPY WITH PROPOFOL;  Surgeon: Toney Reil, MD;  Location: [facility] ENDOSCOPY;  Service: Gastroenterology;  Laterality: N/A;   ESOPHAGOGASTRODUODENOSCOPY (EGD) WITH PROPOFOL N/A 01/28/2020   Procedure: ESOPHAGOGASTRODUODENOSCOPY (EGD) WITH PROPOFOL;  Surgeon: Toney Reil, MD;  Location: [facility] ENDOSCOPY;  Service: Gastroenterology;  Laterality: N/A;   GASTRIC BYPASS     HERNIA REPAIR     INTRAMEDULLARY (IM) NAIL INTERTROCHANTERIC Left 11/25/2020   Procedure: INTRAMEDULLARY (IM) NAIL INTERTROCHANTRIC;  Surgeon: Lyndle Herrlich, MD;  Location: [facility] ORS;  Service: Orthopedics;  Laterality: Left;   JOINT REPLACEMENT Bilateral    Knee surgery   OPEN REDUCTION INTERNAL FIXATION (ORIF) DISTAL RADIAL FRACTURE Right 10/30/2017   Procedure: OPEN REDUCTION INTERNAL FIXATION (ORIF) DISTAL RADIAL FRACTURE;  Surgeon: Garnette Gunner, MD;  Location: [facility] ORS;  Service: Orthopedics;  Laterality: Right;   ORIF FEMUR FRACTURE Right 01/29/2020   Procedure: OPEN REDUCTION INTERNAL FIXATION (ORIF) DISTAL FEMUR FRACTURE;  Surgeon: Christena Flake, MD;  Location: [facility] ORS;  Service: Orthopedics;  Laterality: Right;   Family History:  Family History  Problem Relation Age of Onset   Stroke Mother    Suicidality Father    Family Psychiatric  History: Father committed suicide Social History:  Social History   Substance and Sexual Activity  Alcohol Use Yes   Comment: beer occasionally     Social History   Substance and Sexual Activity  Drug Use Not Currently    Social History   Socioeconomic History   Marital status: Married    Spouse name: Not on file   Number of children: Not on file  Years of education: Not on file   Highest education level: Not on file  Occupational History   Not on file  Tobacco Use   Smoking status: Never   Smokeless tobacco: Former    Types: Chew   Tobacco comments:    quir about 4 years ago  Vaping Use   Vaping Use: Never used  Substance and Sexual  Activity   Alcohol use: Yes    Comment: beer occasionally   Drug use: Not Currently   Sexual activity: Not on file  Other Topics Concern   Not on file  Social History Narrative   Lives at home with his wife. Independent baseline   Social Determinants of Corporate investment banker Strain: Not on file  Food Insecurity: Not on file  Transportation Needs: Not on file  Physical Activity: Not on file  Stress: Not on file  Social Connections: Not on file   Additional Social History:    Allergies:   Allergies  Allergen Reactions   Penicillins Diarrhea    TOLERATED CEFAZOLIN 11/25/20 Unknown childhood reaction    Labs:  Results for orders placed or performed during the hospital encounter of 11/23/20 (from the past 48 hour(s))  Basic metabolic panel     Status: Abnormal   Collection Time: 12/24/20  4:37 AM  Result Value Ref Range   Sodium 138 135 - 145 mmol/L   Potassium 4.1 3.5 - 5.1 mmol/L   Chloride 106 98 - 111 mmol/L   CO2 25 22 - 32 mmol/L   Glucose, Bld 90 70 - 99 mg/dL    Comment: Glucose reference range applies only to samples taken after fasting for at least 8 hours.   BUN 22 8 - 23 mg/dL   Creatinine, Ser 6.72 (L) 0.61 - 1.24 mg/dL   Calcium 8.7 (L) 8.9 - 10.3 mg/dL   GFR, Estimated >09 >47 mL/min    Comment: (NOTE) Calculated using the CKD-EPI Creatinine Equation (2021)    Anion gap 7 5 - 15    Comment: Performed at [facility], [address] [place], [place] [address]    Current Facility-Administered Medications  Medication Dose Route Frequency Provider Last Rate Last Admin   acetaminophen (TYLENOL) tablet 500 mg  500 mg Oral Q6H PRN Leeroy Bock, MD       apixaban Everlene Balls) tablet 5 mg  5 mg Oral BID Charise Killian, MD   5 mg at 12/25/20 0848   cholecalciferol (VITAMIN D3) tablet 1,000 Units  1,000 Units Oral Daily Kathrynn Running, MD   1,000 Units at 12/25/20 0848   ferrous sulfate tablet 325 mg  325 mg Oral QODAY Kathrynn Running, MD   325 mg at 12/25/20 0848   FLUoxetine (PROZAC) capsule 20 mg  20 mg Oral Daily Lyndle Herrlich, MD   20 mg at 12/25/20 0849   folic acid (FOLVITE) tablet 1 mg  1 mg Oral Daily Lyndle Herrlich, MD   1 mg at 12/25/20 0848   loperamide (IMODIUM) capsule 2 mg  2 mg Oral TID AC Wouk, Wilfred Curtis, MD   2 mg at 12/25/20 1246   methocarbamol (ROBAXIN) tablet 500 mg  500 mg Oral Q6H PRN Lyndle Herrlich, MD   500 mg at 12/05/20 2114   metoprolol succinate (TOPROL-XL) 24 hr tablet 12.5 mg  12.5 mg Oral Daily Kathrynn Running, MD   12.5 mg at 12/25/20 0848   multivitamin with minerals tablet 1 tablet  1  tablet Oral Daily Lyndle Herrlich, MD   1 tablet at 12/25/20 0849   ondansetron [facility]) tablet 4 mg  4 mg Oral Q6H PRN Lyndle Herrlich, MD       Or   ondansetron [facility]) injection 4 mg  4 mg Intravenous Q6H PRN Lyndle Herrlich, MD       potassium chloride SA (KLOR-CON) CR tablet 40 mEq  40 mEq Oral Daily Alford Highland, MD   40 mEq at 12/25/20 0848   thiamine tablet 100 mg  100 mg Oral Daily Lyndle Herrlich, MD   100 mg at 12/25/20 0848    Musculoskeletal: Strength & Muscle Tone: within normal limits Gait & Station: normal Patient leans: N/A            Psychiatric Specialty Exam:  Presentation  General Appearance:  No data recorded Eye Contact: No data recorded Speech: No data recorded Speech Volume: No data recorded Handedness: No data recorded  Mood and Affect  Mood: No data recorded Affect: No data recorded  Thought Process  Thought Processes: No data recorded Descriptions of Associations:No data recorded Orientation:No data recorded Thought Content:No data recorded History of Schizophrenia/Schizoaffective disorder:No  Duration of Psychotic Symptoms:No data recorded Hallucinations:No data recorded Ideas of Reference:No data recorded Suicidal Thoughts:No data recorded Homicidal Thoughts:No data recorded  Sensorium  Memory: No data  recorded Judgment: No data recorded Insight: No data recorded  Executive Functions  Concentration: No data recorded Attention Span: No data recorded Recall: No data recorded Fund of Knowledge: No data recorded Language: No data recorded  Psychomotor Activity  Psychomotor Activity: No data recorded  Assets  Assets: No data recorded  Sleep  Sleep: No data recorded  Physical Exam: Physical Exam Vitals and nursing note reviewed.  Constitutional:      Appearance: Normal appearance.  HENT:     Head: Normocephalic and atraumatic.     Mouth/Throat:     Pharynx: Oropharynx is clear.  Eyes:     Pupils: Pupils are equal, round, and reactive to light.  Cardiovascular:     Rate and Rhythm: Normal rate and regular rhythm.  Pulmonary:     Effort: Pulmonary effort is normal.     Breath sounds: Normal breath sounds.  Abdominal:     General: Abdomen is flat.     Palpations: Abdomen is soft.  Musculoskeletal:        General: Normal range of motion.  Skin:    General: Skin is warm and dry.  Neurological:     General: No focal deficit present.     Mental Status: He is alert. Mental status is at baseline.  Psychiatric:        Attention and Perception: He is inattentive.        Mood and Affect: Mood normal. Affect is blunt.        Speech: Speech is delayed.        Thought Content: Thought content normal.   Review of Systems  Constitutional: Negative.   HENT: Negative.    Eyes: Negative.   Respiratory: Negative.    Cardiovascular: Negative.   Gastrointestinal: Negative.   Musculoskeletal: Negative.   Skin: Negative.   Neurological: Negative.   Psychiatric/Behavioral: Negative.    Blood pressure 111/81, pulse 71, temperature 98 F (36.7 C), resp. rate 18, height 6' (1.829 m), weight 77.1 kg, SpO2 99 %. Body mass index is 23.06 kg/m.  Treatment Plan Summary: Plan patient requires transport to [facility] tomorrow.  IVC filed.  Continue current  medication treatment pending transfer to geriatric psychiatry unit  Disposition:  See note above  Mordecai Rasmussen, MD 12/25/2020 4:56 PM

## 2020-12-25 NOTE — Progress Notes (Signed)
PROGRESS NOTE  James Sanford    DOB: Feb 13, 1956, 65 y.o.  WSF:[id number]  PCP: Jerl Mina, MD   Code Status: Full Code   DOA: 11/23/2020   LOS: 31  Brief Narrative of Current Hospitalization  James Sanford is a 65 y.o. male with a PMH significant for HTN, HLD, A. fib, alcohol dependence. They presented from home to the ED on 11/23/2020 with fall. In the ED, it was found that they had left hip fracture. They were treated with intramedullary nail intertrochanteric procedure.  He was also seen by psych for moderate major depression and recommended for geriatric psych inpatient treatment. Patient was admitted to medicine service for further workup and management of chronic medical management and subacute recovery from hip fracture as outlined in detail below.  12/25/20 - stable  Assessment & Plan  Principal Problem:   Moderate major depression, single episode (HCC) Active Problems:   Alcohol abuse   Diabetes mellitus type 2, uncomplicated (HCC)   AF (paroxysmal atrial fibrillation) (HCC)   Essential hypertension   Closed nondisplaced intertrochanteric fracture of left femur (HCC)   Acute lower UTI   Protein-calorie malnutrition, severe   Malnutrition of moderate degree   Diarrhea   Impaired fasting glucose   Hypokalemia   Iron deficiency anemia  Chronic major depression- stable.  - follow up with psychiatry to reevaluate for discharge planning - continue fluoxetine -Stable for discharge to geriatric psych unit when a bed is available. Now has no PT requirements for follow up.  Chronic diarrhea- GI signed off. stools have stabilized.  - continue loperamide and decrease use as tolerated - follow up GI outpatient  L intertrochanteric hip fracture- cleared for discharge by ortho who has signed off - continue PT - follow up ortho OP around 11/1 -Tylenol as needed for pain  pAF- stable - continue metoprolol - continue eliquis  Alcohol dependence- stable.  No withdrawal symptoms currently - continue nutritional support  Hypokalemia- resolved. K+ 4.1 yesterday - check intermittent labs  Iron deficiency anemia- stable hemoglobin 11.5 9/19 -CBC weekly -Continue oral iron supplementation and bowel regiment  DVT prophylaxis: Place and maintain sequential compression device Start: 11/26/20 1356 SCDs Start: 11/25/20 2141 SCDs Start: 11/24/20 1407 apixaban (ELIQUIS) tablet 5 mg   Diet:  Diet Orders (From admission, onward)     Start     Ordered   11/25/20 1621  Diet regular Room service appropriate? Yes; Fluid consistency: Thin  Diet effective now       Question Answer Comment  Room service appropriate? Yes   Fluid consistency: Thin      11/25/20 1620            Subjective 12/25/20    Patient is doing well today. He has no complaints.   Disposition Plan & Communication  Status is: Inpatient  Remains inpatient appropriate because:Unsafe d/c plan  Dispo: The patient is from: Home              Anticipated d/c is to:  Geriatric psych unit              Patient currently is medically stable to d/c.   Difficult to place patient Yes  Family Communication: Wife  Consults, Procedures, Significant Events  Consultants:  Psychiatry GI Ortho  Procedures/significant events:  L Hip fixation  Antimicrobials:  Anti-infectives (From admission, onward)    Start     Dose/Rate Route Frequency Ordered Stop   11/25/20 2230  ceFAZolin (ANCEF) IVPB 1 g/50 mL premix  1 g 100 mL/hr over 30 Minutes Intravenous Every 6 hours 11/25/20 2140 11/26/20 1204   11/25/20 0600  ceFAZolin (ANCEF) IVPB 2g/100 mL premix        2 g 200 mL/hr over 30 Minutes Intravenous 30 min pre-op 11/24/20 1523 11/25/20 1440   11/24/20 1500  cefTRIAXone (ROCEPHIN) 1 g in sodium chloride 0.9 % 100 mL IVPB  Status:  Discontinued        1 g 200 mL/hr over 30 Minutes Intravenous Every 24 hours 11/24/20 1448 11/27/20 1438   11/24/20 1300  cephALEXin (KEFLEX)  capsule 500 mg        500 mg Oral  Once 11/24/20 1256 11/24/20 1313   11/24/20 0000  cephALEXin (KEFLEX) 500 MG capsule        500 mg Oral 3 times daily 11/24/20 1257 12/01/20 2359        Objective   Vitals:   12/24/20 1619 12/24/20 2005 12/24/20 2337 12/25/20 0512  BP: 110/75 93/72 104/76 116/82  Pulse: 67 (!) 57 (!) 58 (!) 59  Resp: 18 16 17 17   Temp: 98.1 F (36.7 C) 98 F (36.7 C) 98 F (36.7 C) (!) 97.5 F (36.4 C)  TempSrc:  Oral    SpO2: 100% 100% 100% 100%  Weight:      Height:        Intake/Output Summary (Last 24 hours) at 12/25/2020 0742 Last data filed at 12/25/2020 0500 Gross per 24 hour  Intake 120 ml  Output 950 ml  Net -830 ml    Filed Weights   11/23/20 1438  Weight: 77.1 kg    Patient BMI: Body mass index is 23.06 kg/m.   Physical Exam: General: awake, alert, NAD HEENT: atraumatic, clear conjunctiva, anicteric sclera, moist mucus membranes, hearing grossly normal Respiratory: normal respiratory effort. Cardiovascular: normal S1/S2,  RRR, no JVD, murmurs, rubs, gallops, quick capillary refill  Gastrointestinal: soft, NT, ND Nervous: A&O x3. no gross focal neurologic deficits, normal speech Extremities: moves all equally, no edema, normal tone Skin: dry, intact, normal temperature, normal color, No rashes, lesions or ulcers Psychiatry: normal mood, congruent affect, judgement and insight appear normal  Labs   I have personally reviewed following labs and imaging studies No results displayed because visit has over 200 results.       Recent Results (from the past 240 hour(s))  Calprotectin, Fecal     Status: None   Collection Time: 12/17/20  4:15 PM   Specimen: Stool  Result Value Ref Range Status   Calprotectin, Fecal 120 0 - 120 ug/g Final    Comment: (NOTE) Concentration     Interpretation   Follow-Up <16 - 50 ug/g     Normal           None >50 -120 ug/g     Borderline       Re-evaluate in 4-6 weeks    >120 ug/g     Abnormal          Repeat as clinically                                   indicated Performed At: [facility] [address] [place], [place] [id number] MD Jolene Schimke      Imaging Studies  No results found. Medications   Scheduled Meds:  apixaban  5 mg Oral BID   cholecalciferol  1,000 Units Oral Daily   ferrous sulfate  325 mg Oral QODAY   FLUoxetine  20 mg Oral Daily   folic acid  1 mg Oral Daily   loperamide  2 mg Oral TID AC   metoprolol succinate  12.5 mg Oral Daily   multivitamin with minerals  1 tablet Oral Daily   potassium chloride  40 mEq Oral Daily   thiamine  100 mg Oral Daily     LOS: 31 days   Time spent: >35 min   Leeroy Bock, DO [facility] 12/25/2020, 7:42 AM   To contact the [facility] Attending or Consulting provider for this patient: Check the care team in [facility] for a) attending/consulting [facility] provider listed and b) the [facility] team listed Log into [email or web address] and use [place]'s universal password to access. If you do not have the password, please contact the hospital operator. Locate the [facility] provider you are looking for under [facility] and page to a number that you can be directly reached. If you still have difficulty reaching the provider, please page the [facility] (Director on Call) for the Hospitalists listed on amion for assistance.

## 2020-12-26 MED ORDER — FERROUS SULFATE 325 (65 FE) MG PO TABS: 325.0000 mg | ORAL_TABLET | ORAL | 3 refills | Status: DC

## 2020-12-26 MED ORDER — FLUOXETINE HCL 20 MG PO CAPS: 20.0000 mg | ORAL_CAPSULE | Freq: Every day | ORAL | 3 refills | Status: DC

## 2020-12-26 NOTE — Progress Notes (Signed)
Sheriffs arrived to facility. Sheriffs noticed incorrect spelling on patient's last name upon reviewing the IVC forms. Sheriffs are unable to transport patient at this time due to error of last name. Notified Dr. Toni Amend regarding this matter. Dr. Toni Amend reviewed the forms and clarified with the Sheriffs, the forms must be re-written /resend to [facility] by the [place] from the [employer]. The sheriffs will be back to facility to transport patient on 12/27/20 at 0800.

## 2020-12-26 NOTE — Progress Notes (Signed)
Contacted [facility]. Reported to facility nurse, Chidinma, RN. Reviewed discharge instructions. Per facility nurse, Verbalized understanding.

## 2020-12-26 NOTE — Progress Notes (Addendum)
Notified [facility] regarding transportation delayed. Reported issues to facility nurse, Chrissie Noa, RN.

## 2020-12-26 NOTE — Discharge Summary (Signed)
Physician Discharge Summary  James Sanford ION:[id number] DOB: 06/08/1955 DOA: 11/23/2020  PCP: Jerl Mina, MD  Admit date: 11/23/2020.  Discharge date: 12/26/2020  Admitted From: Home.  Disposition:  Psych facility( Inpatient Psych)  Recommendations for Outpatient Follow-up:  Follow up with PCP in 1-2 weeks. Please obtain BMP/CBC in one week. Follow-up with orthopedics on February 03, 2021 Patient is being discharged to psych facility for severe depression  Home Health: None Equipment/Devices:None  Discharge Condition: Stable CODE STATUS:Full code Diet recommendation: Heart Healthy   Brief Summary / Hospital course: This 65 y.o. male with a PMH significant for HTN, HLD, A. fib, alcohol dependence presented to the ED on 11/23/2020 status post fall. In the ED, it was found that he had left hip fracture.  Orthopedics was consulted,  Patient was admitted  in the hospital , patient underwent ORIF (intramedullary nail intertrochanteric procedure) next day.  Hospital course was complicated by suicidal ideations and severe depression.  He was also seen by psych for moderate major depression and recommended for geriatric psych inpatient treatment.  Patient was medically stable, cleared from Ortho to be discharged and follow-up on February 03, 2021. Patient is being discharged to [facility] (inpatient psych facility).   He was managed for below problems.  Discharge Diagnoses:  Principal Problem:   Moderate major depression, single episode (HCC) Active Problems:   Alcohol abuse   Diabetes mellitus type 2, uncomplicated (HCC)   AF (paroxysmal atrial fibrillation) (HCC)   Essential hypertension   Closed nondisplaced intertrochanteric fracture of left femur (HCC)   Acute lower UTI   Protein-calorie malnutrition, severe   Malnutrition of moderate degree   Diarrhea   Impaired fasting glucose   Hypokalemia   Iron deficiency anemia  Chronic major depression- stable.   Patient was evaluated by psychiatry,  recommended inpatient [facility] psych transfer. Continue fluoxetine Stable for discharge to geriatric psych unit today.  Now has no PT requirements for follow up.   Chronic diarrhea-resolved GI signed off. stools have stabilized.  Continue loperamide as needed Follow up GI outpatient   L intertrochanteric hip fracture-  Cleared for discharge by ortho who has signed off Continue PT Follow up ortho OP around 11/1 Tylenol as needed for pain   pAF- stable Continue metoprolol Continue eliquis   Alcohol dependence- stable.  No withdrawal symptoms currently Continue nutritional support   Hypokalemia- resolved.  K+ 4.1 yesterday check intermittent labs   Iron deficiency anemia- stable. hemoglobin 11.5 9/19 CBC weekly Continue oral iron supplementation and bowel regiment  Discharge Instructions  Discharge Instructions     Call MD for:  difficulty breathing, headache or visual disturbances   Complete by: As directed    Call MD for:  persistant dizziness or light-headedness   Complete by: As directed    Call MD for:  persistant nausea and vomiting   Complete by: As directed    Call MD for:  severe uncontrolled pain   Complete by: As directed    Diet - low sodium heart healthy   Complete by: As directed    Diet - low sodium heart healthy   Complete by: As directed    Diet Carb Modified   Complete by: As directed    Diet general   Complete by: As directed    Discharge instructions   Complete by: As directed    Patient is being discharged to psych facility for severe depression. Advised to follow-up with orthopedics in 2 weeks.   Discharge wound care:  Complete by: As directed    Follow-up orthopedics in 2 weeks.   Discharge wound care:   Complete by: As directed    Follow-up orthopedics.   Increase activity slowly   Complete by: As directed    Increase activity slowly   Complete by: As directed       Allergies as of 12/26/2020        Reactions   Penicillins Diarrhea   TOLERATED CEFAZOLIN 11/25/20 Unknown childhood reaction        Medication List     TAKE these medications    Eliquis 5 MG Tabs tablet Generic drug: apixaban Take 5 mg by mouth 2 (two) times daily.   ferrous sulfate 325 (65 FE) MG tablet Take 1 tablet (325 mg total) by mouth every other day. Start taking on: December 27, 2020   FLUoxetine 20 MG capsule Commonly known as: PROZAC Take 1 capsule (20 mg total) by mouth daily. Start taking on: December 27, 2020   HYDROcodone-acetaminophen 5-325 MG tablet Commonly known as: NORCO/VICODIN Take 1 tablet by mouth every 6 (six) hours as needed for moderate pain.   metoprolol succinate 50 MG 24 hr tablet Commonly known as: TOPROL-XL Take 0.5 tablets (25 mg total) by mouth daily.   Probiotic 250 MG Caps Take 1 capsule by mouth in the morning and at bedtime.               Durable Medical Equipment  (From admission, onward)           Start     Ordered   12/25/20 1639  For home use only DME Walker rolling  Once       Question Answer Comment  Walker: With 5 Inch Wheels   Patient needs a walker to treat with the following condition Weakness      12/25/20 1639              Discharge Care Instructions  (From admission, onward)           Start     Ordered   12/26/20 0000  Discharge wound care:       Comments: Follow-up orthopedics in 2 weeks.   12/26/20 1023   12/26/20 0000  Discharge wound care:       Comments: Follow-up orthopedics.   12/26/20 1027            Follow-up Information     Jerl Mina, MD Follow up in 1 week(s).   Specialty: Family Medicine Contact information: [address] [facility] [place] [address] [phone]         Lyndle Herrlich, MD Follow up in 1 month(s).   Specialty: Orthopedic Surgery Contact information: [address] [place] [address] [phone]                Allergies   Allergen Reactions   Penicillins Diarrhea    TOLERATED CEFAZOLIN 11/25/20 Unknown childhood reaction    Consultations: Orthopedics, GI, psychiatry  Procedures/Studies: DG HIP UNILAT WITH PELVIS 2-3 VIEWS LEFT  Result Date: 12/17/2020 CLINICAL DATA:  Left hip fracture. EXAM: DG HIP (WITH OR WITHOUT PELVIS) 2-3V LEFT COMPARISON:  November 25, 2020.  November 24, 2020. FINDINGS: Status post surgical internal fixation of proximal left femoral intertrochanteric fracture. No new fracture or dislocation is noted. IMPRESSION: Status post surgical internal fixation of proximal left femoral intertrochanteric fracture. Electronically Signed   By: Lupita Raider M.D.   On: 12/17/2020 16:18  Status post open reduction internal fixation left hip fracture.   Subjective: Patient was seen and examined at bedside.  He reports feeling much improved.   He was sitting on the bed on the side,  reports having hip pain but manageable with pain medication.   Patient is being discharged to psych facility for severe depression.  Discharge Exam: Vitals:   12/26/20 0524 12/26/20 0921  BP: 108/75 107/83  Pulse: 60 78  Resp: 16 16  Temp: 97.7 F (36.5 C) (!) 97.4 F (36.3 C)  SpO2: 99% 100%   Vitals:   12/25/20 1931 12/26/20 0001 12/26/20 0524 12/26/20 0921  BP: 104/76 112/76 108/75 107/83  Pulse: (!) 58 (!) 58 60 78  Resp: 16 17 16 16   Temp: 98 F (36.7 C) 97.8 F (36.6 C) 97.7 F (36.5 C) (!) 97.4 F (36.3 C)  TempSrc: Oral Oral Oral Oral  SpO2: 100% 100% 99% 100%  Weight:      Height:        General: Pt is alert, awake, not in acute distress Cardiovascular: RRR, S1/S2 +, no rubs, no gallops Respiratory: CTA bilaterally, no wheezing, no rhonchi Abdominal: Soft, NT, ND, bowel sounds + Extremities: no edema, no cyanosis    The results of significant diagnostics from this hospitalization (including imaging, microbiology, ancillary and laboratory) are listed below for reference.      Microbiology: Recent Results (from the past 240 hour(s))  Calprotectin, Fecal     Status: None   Collection Time: 12/17/20  4:15 PM   Specimen: Stool  Result Value Ref Range Status   Calprotectin, Fecal 120 0 - 120 ug/g Final    Comment: (NOTE) Concentration     Interpretation   Follow-Up <16 - 50 ug/g     Normal           None >50 -120 ug/g     Borderline       Re-evaluate in 4-6 weeks    >120 ug/g     Abnormal         Repeat as clinically                                   indicated Performed At: [facility] [address] [employer], [place] [id number] MD Jolene Schimke      Labs: BNP (last 3 results) No results for input(s): BNP in the last 8760 hours. Basic Metabolic Panel: Recent Labs  Lab 12/21/20 0522 12/22/20 0418 12/24/20 0437  NA 139 139 138  K 3.5 3.8 4.1  CL 109 109 106  CO2 26 25 25   GLUCOSE 91 95 90  BUN 21 21 22   CREATININE 0.44* 0.55* 0.54*  CALCIUM 8.4* 8.5* 8.7*   Liver Function Tests: Recent Labs  Lab 12/21/20 0522  AST 17  ALT 14  ALKPHOS 130*  BILITOT 0.8   No results for input(s): LIPASE, AMYLASE in the last 168 hours. No results for input(s): AMMONIA in the last 168 hours. CBC: Recent Labs  Lab 12/21/20 0522 12/22/20 0418  WBC 5.4 5.8  HGB 11.2* 11.5*  HCT 34.1* 34.8*  MCV 98.6 97.8  PLT 171 175   Cardiac Enzymes: No results for input(s): CKTOTAL, CKMB, CKMBINDEX, TROPONINI in the last 168 hours. BNP: Invalid input(s): POCBNP CBG: No results for input(s): GLUCAP in the last 168 hours. D-Dimer No results for input(s): DDIMER in the last 72 hours. Hgb A1c No results  for input(s): HGBA1C in the last 72 hours. Lipid Profile No results for input(s): CHOL, HDL, LDLCALC, TRIG, CHOLHDL, LDLDIRECT in the last 72 hours. Thyroid function studies No results for input(s): TSH, T4TOTAL, T3FREE, THYROIDAB in the last 72 hours.  Invalid input(s): FREET3 Anemia work up No results for input(s): VITAMINB12,  FOLATE, FERRITIN, TIBC, IRON, RETICCTPCT in the last 72 hours. Urinalysis    Component Value Date/Time   COLORURINE YELLOW 11/23/2020 2130   APPEARANCEUR CLOUDY (A) 11/23/2020 2130   LABSPEC >1.030 (H) 11/23/2020 2130   PHURINE 5.0 11/23/2020 2130   GLUCOSEU NEGATIVE 11/23/2020 2130   HGBUR TRACE (A) 11/23/2020 2130   BILIRUBINUR SMALL (A) 11/23/2020 2130   KETONESUR NEGATIVE 11/23/2020 2130   PROTEINUR TRACE (A) 11/23/2020 2130   NITRITE POSITIVE (A) 11/23/2020 2130   LEUKOCYTESUR TRACE (A) 11/23/2020 2130   Sepsis Labs Invalid input(s): PROCALCITONIN,  WBC,  LACTICIDVEN Microbiology Recent Results (from the past 240 hour(s))  Calprotectin, Fecal     Status: None   Collection Time: 12/17/20  4:15 PM   Specimen: Stool  Result Value Ref Range Status   Calprotectin, Fecal 120 0 - 120 ug/g Final    Comment: (NOTE) Concentration     Interpretation   Follow-Up <16 - 50 ug/g     Normal           None >50 -120 ug/g     Borderline       Re-evaluate in 4-6 weeks    >120 ug/g     Abnormal         Repeat as clinically                                   indicated Performed At: [facility] [address] [place], [place] [id number] Jolene Schimke MD WU:[id number]      Time coordinating discharge: Over 30 minutes  SIGNED:   Cipriano Bunker, MD  [facility] 12/26/2020, 11:06 AM Pager   If 7PM-7AM, please contact night-coverage

## 2020-12-26 NOTE — TOC Progression Note (Signed)
Transition of Care [facility]) - Progression Note    Patient Details  Name: James Sanford MRN: [id number] Date of Birth: Nov 12, 1955  Transition of Care [facility] CM/SW Contact  Barrie Dunker, RN Phone Number: 12/26/2020, 11:02 AM  Clinical Narrative:   Spoke with the patient's wife and notified her that the patient will transport with the sherriff's office to go to [facility] today, I explained that the patient is IVC, she stated that she called [facility] yesterday and got some information, the patient will be able to take his Rolling walker brought into the room by [employer]         Expected Discharge Plan and Services           Expected Discharge Date: 12/26/20                                     Social Determinants of Health (SDOH) Interventions    Readmission Risk Interventions No flowsheet data found.

## 2020-12-26 NOTE — Progress Notes (Signed)
Mobility Specialist - Progress Note   12/26/20 1100  Mobility  Activity Refused mobility  Mobility performed by Mobility specialist    Pt politely declined mobility this date, no reason specified.    Filiberto Pinks Mobility Specialist 12/26/20, 11:16 AM

## 2020-12-27 NOTE — Plan of Care (Signed)
No acute events this shift. Problem: Education: Goal: Verbalization of understanding the information provided (i.e., activity precautions, restrictions, etc) will improve Outcome: Completed/Met Goal: Individualized Educational Video(s) Outcome: Completed/Met   Problem: Activity: Goal: Ability to ambulate and perform ADLs will improve Outcome: Completed/Met   Problem: Clinical Measurements: Goal: Postoperative complications will be avoided or minimized Outcome: Completed/Met   Problem: Self-Concept: Goal: Ability to maintain and perform role responsibilities to the fullest extent possible will improve Outcome: Completed/Met   Problem: Education: Goal: Knowledge of General Education information will improve Description: Including pain rating scale, medication(s)/side effects and non-pharmacologic comfort measures Outcome: Completed/Met   Problem: Health Behavior/Discharge Planning: Goal: Ability to manage health-related needs will improve Outcome: Completed/Met   Problem: Clinical Measurements: Goal: Diagnostic test results will improve Outcome: Completed/Met Goal: Respiratory complications will improve Outcome: Completed/Met Goal: Cardiovascular complication will be avoided Outcome: Completed/Met   Problem: Activity: Goal: Risk for activity intolerance will decrease Outcome: Completed/Met   Problem: Nutrition: Goal: Adequate nutrition will be maintained Outcome: Completed/Met   Problem: Coping: Goal: Level of anxiety will decrease Outcome: Completed/Met   Problem: Elimination: Goal: Will not experience complications related to urinary retention Outcome: Completed/Met   Problem: Pain Managment: Goal: General experience of comfort will improve Outcome: Completed/Met   Problem: Safety: Goal: Ability to remain free from injury will improve Outcome: Completed/Met   Problem: Skin Integrity: Goal: Risk for impaired skin integrity will decrease Outcome:  Completed/Met   Problem: Education: Goal: Knowledge of General Education information will improve Description: Including pain rating scale, medication(s)/side effects and non-pharmacologic comfort measures Outcome: Completed/Met   Problem: Health Behavior/Discharge Planning: Goal: Ability to manage health-related needs will improve Outcome: Completed/Met   Problem: Clinical Measurements: Goal: Ability to maintain clinical measurements within normal limits will improve Outcome: Completed/Met Goal: Will remain free from infection Outcome: Completed/Met Goal: Diagnostic test results will improve Outcome: Completed/Met Goal: Respiratory complications will improve Outcome: Completed/Met Goal: Cardiovascular complication will be avoided Outcome: Completed/Met   Problem: Activity: Goal: Risk for activity intolerance will decrease Outcome: Completed/Met   Problem: Nutrition: Goal: Adequate nutrition will be maintained Outcome: Completed/Met   Problem: Coping: Goal: Level of anxiety will decrease Outcome: Completed/Met   Problem: Elimination: Goal: Will not experience complications related to bowel motility Outcome: Completed/Met Goal: Will not experience complications related to urinary retention Outcome: Completed/Met   Problem: Pain Managment: Goal: General experience of comfort will improve Outcome: Completed/Met   Problem: Safety: Goal: Ability to remain free from injury will improve Outcome: Completed/Met   Problem: Skin Integrity: Goal: Risk for impaired skin integrity will decrease Outcome: Completed/Met

## 2021-01-12 ENCOUNTER — Telehealth: Payer: Self-pay

## 2021-01-12 NOTE — Telephone Encounter (Signed)
-----   Message from Pasty Spillers, MD sent at 01/12/2021  9:13 AM EDT ----- Please schedule hospital follow-up with Dr. Allegra Lai for diarrhea

## 2021-01-12 NOTE — Telephone Encounter (Signed)
Called and left a message for call back. Left a detail message to call and make a hospital follow up appointment with Dr. Allegra Lai

## 2021-01-23 ENCOUNTER — Encounter: Payer: Self-pay | Admitting: Orthopedic Surgery

## 2021-03-03 ENCOUNTER — Emergency Department: Payer: BC Managed Care – PPO

## 2021-03-03 ENCOUNTER — Inpatient Hospital Stay
Admission: EM | Admit: 2021-03-03 | Discharge: 2021-03-06 | DRG: 125 | Disposition: A | Discharge status: Home / Self Care | Payer: BC Managed Care – PPO | Attending: Internal Medicine | Admitting: Internal Medicine

## 2021-03-03 ENCOUNTER — Other Ambulatory Visit: Payer: Self-pay

## 2021-03-03 DIAGNOSIS — Z20822 Contact with and (suspected) exposure to covid-19: Secondary | ICD-10-CM | POA: Diagnosis present

## 2021-03-03 DIAGNOSIS — R21 Rash and other nonspecific skin eruption: Secondary | ICD-10-CM | POA: Diagnosis present

## 2021-03-03 DIAGNOSIS — L03213 Periorbital cellulitis: Secondary | ICD-10-CM | POA: Diagnosis present

## 2021-03-03 DIAGNOSIS — Z823 Family history of stroke: Secondary | ICD-10-CM

## 2021-03-03 DIAGNOSIS — F32A Depression, unspecified: Secondary | ICD-10-CM | POA: Diagnosis present

## 2021-03-03 DIAGNOSIS — E872 Acidosis, unspecified: Secondary | ICD-10-CM | POA: Diagnosis present

## 2021-03-03 DIAGNOSIS — Z79899 Other long term (current) drug therapy: Secondary | ICD-10-CM

## 2021-03-03 DIAGNOSIS — F1020 Alcohol dependence, uncomplicated: Secondary | ICD-10-CM | POA: Diagnosis present

## 2021-03-03 DIAGNOSIS — Z7901 Long term (current) use of anticoagulants: Secondary | ICD-10-CM

## 2021-03-03 DIAGNOSIS — E785 Hyperlipidemia, unspecified: Secondary | ICD-10-CM | POA: Diagnosis present

## 2021-03-03 DIAGNOSIS — N179 Acute kidney failure, unspecified: Secondary | ICD-10-CM | POA: Diagnosis present

## 2021-03-03 DIAGNOSIS — R7989 Other specified abnormal findings of blood chemistry: Secondary | ICD-10-CM | POA: Diagnosis present

## 2021-03-03 DIAGNOSIS — Z9181 History of falling: Secondary | ICD-10-CM | POA: Diagnosis not present

## 2021-03-03 DIAGNOSIS — N390 Urinary tract infection, site not specified: Secondary | ICD-10-CM | POA: Diagnosis present

## 2021-03-03 DIAGNOSIS — G4730 Sleep apnea, unspecified: Secondary | ICD-10-CM | POA: Diagnosis present

## 2021-03-03 DIAGNOSIS — E860 Dehydration: Secondary | ICD-10-CM | POA: Diagnosis present

## 2021-03-03 DIAGNOSIS — I480 Paroxysmal atrial fibrillation: Secondary | ICD-10-CM | POA: Diagnosis present

## 2021-03-03 DIAGNOSIS — Z880 Allergy status to penicillin: Secondary | ICD-10-CM | POA: Diagnosis not present

## 2021-03-03 DIAGNOSIS — E119 Type 2 diabetes mellitus without complications: Secondary | ICD-10-CM | POA: Diagnosis present

## 2021-03-03 DIAGNOSIS — B0230 Zoster ocular disease, unspecified: Principal | ICD-10-CM | POA: Diagnosis present

## 2021-03-03 DIAGNOSIS — I10 Essential (primary) hypertension: Secondary | ICD-10-CM | POA: Diagnosis present

## 2021-03-03 DIAGNOSIS — R29898 Other symptoms and signs involving the musculoskeletal system: Secondary | ICD-10-CM

## 2021-03-03 DIAGNOSIS — G4733 Obstructive sleep apnea (adult) (pediatric): Secondary | ICD-10-CM | POA: Diagnosis present

## 2021-03-03 DIAGNOSIS — B9620 Unspecified Escherichia coli [E. coli] as the cause of diseases classified elsewhere: Secondary | ICD-10-CM | POA: Diagnosis present

## 2021-03-03 LAB — COMPREHENSIVE METABOLIC PANEL
ALT: 19 U/L (ref 0–44)
AST: 45 U/L — ABNORMAL HIGH (ref 15–41)
Albumin: 3.6 g/dL (ref 3.5–5.0)
Alkaline Phosphatase: 98 U/L (ref 38–126)
Anion gap: 12 (ref 5–15)
BUN: 28 mg/dL — ABNORMAL HIGH (ref 8–23)
CO2: 25 mmol/L (ref 22–32)
Calcium: 8.3 mg/dL — ABNORMAL LOW (ref 8.9–10.3)
Chloride: 100 mmol/L (ref 98–111)
Creatinine, Ser: 1.88 mg/dL — ABNORMAL HIGH (ref 0.61–1.24)
GFR, Estimated: 40 mL/min — ABNORMAL LOW (ref >60)
Glucose, Bld: 172 mg/dL — ABNORMAL HIGH (ref 70–99)
Potassium: 4 mmol/L (ref 3.5–5.1)
Sodium: 137 mmol/L (ref 135–145)
Total Bilirubin: 1.6 mg/dL — ABNORMAL HIGH (ref 0.3–1.2)
Total Protein: 6.9 g/dL (ref 6.5–8.1)

## 2021-03-03 LAB — CBC WITH DIFFERENTIAL/PLATELET
Abs Immature Granulocytes: 0.02 10*3/uL (ref 0.00–0.07)
Basophils Absolute: 0 10*3/uL (ref 0.0–0.1)
Basophils Relative: 0 %
Eosinophils Absolute: 0 10*3/uL (ref 0.0–0.5)
Eosinophils Relative: 0 %
HCT: 37.1 % — ABNORMAL LOW (ref 39.0–52.0)
Hemoglobin: 11.8 g/dL — ABNORMAL LOW (ref 13.0–17.0)
Immature Granulocytes: 0 %
Lymphocytes Relative: 8 %
Lymphs Abs: 0.5 10*3/uL — ABNORMAL LOW (ref 0.7–4.0)
MCH: 29.7 pg (ref 26.0–34.0)
MCHC: 31.8 g/dL (ref 30.0–36.0)
MCV: 93.5 fL (ref 80.0–100.0)
Monocytes Absolute: 0.9 10*3/uL (ref 0.1–1.0)
Monocytes Relative: 13 %
Neutro Abs: 5.4 10*3/uL (ref 1.7–7.7)
Neutrophils Relative %: 79 %
Platelets: 123 10*3/uL — ABNORMAL LOW (ref 150–400)
RBC: 3.97 MIL/uL — ABNORMAL LOW (ref 4.22–5.81)
RDW: 15.2 % (ref 11.5–15.5)
WBC: 6.9 10*3/uL (ref 4.0–10.5)
nRBC: 0 % (ref 0.0–0.2)

## 2021-03-03 LAB — RESP PANEL BY RT-PCR (FLU A&B, COVID) ARPGX2
Influenza A by PCR: NEGATIVE
Influenza B by PCR: NEGATIVE
SARS Coronavirus 2 by RT PCR: NEGATIVE

## 2021-03-03 LAB — LACTIC ACID, PLASMA
Lactic Acid, Venous: 2.4 mmol/L (ref 0.5–1.9)
Lactic Acid, Venous: 2.5 mmol/L (ref 0.5–1.9)

## 2021-03-03 IMAGING — CT CT HEAD W/O CM
4 series · 16 of 47 positions shown, 18 images · non-contrast
Comparison: Orbital CT earlier same day.

CLINICAL DATA: Neurological deficit, acute, stroke suspected.

EXAM:
CT HEAD WITHOUT CONTRAST
TECHNIQUE: Contiguous axial images were obtained from the base of the skull
through the vertex without intravenous contrast.

[Series 2: head wo · axial · 0.44mm/px · z∈[+425,+545]mm · 7 of 33 slices shown, 9 images]
[im 5/33  brain]
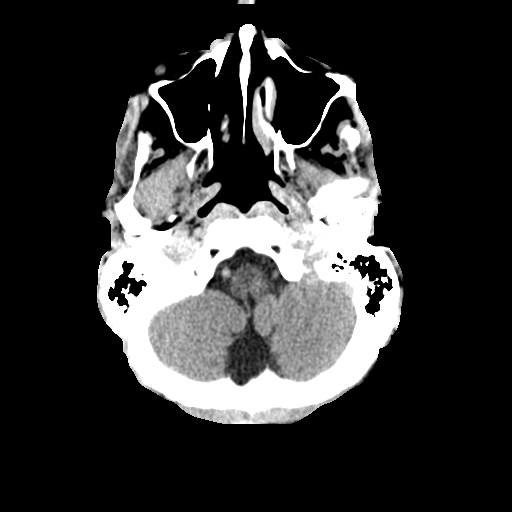
[im 5/33  bone]
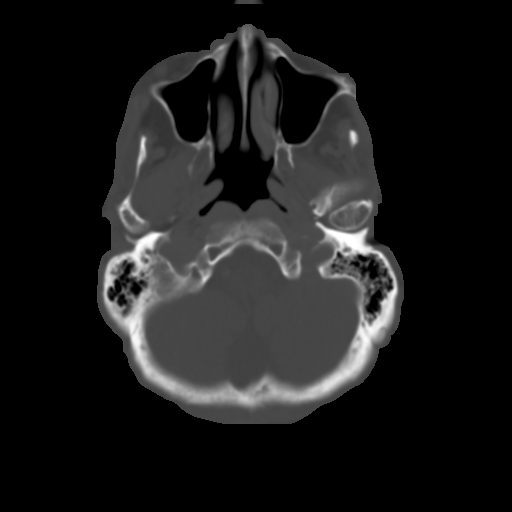
[im 9/33  brain]
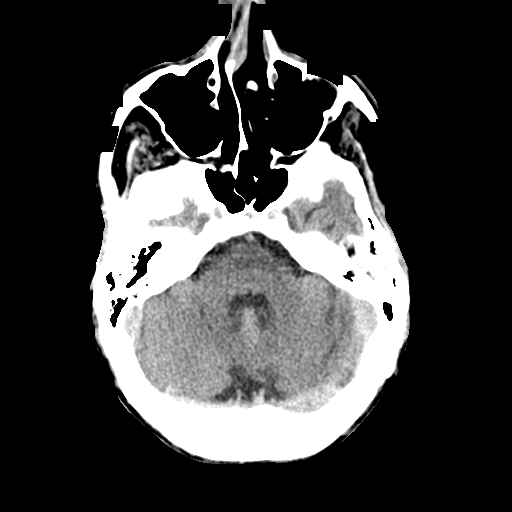
[im 13/33  brain]
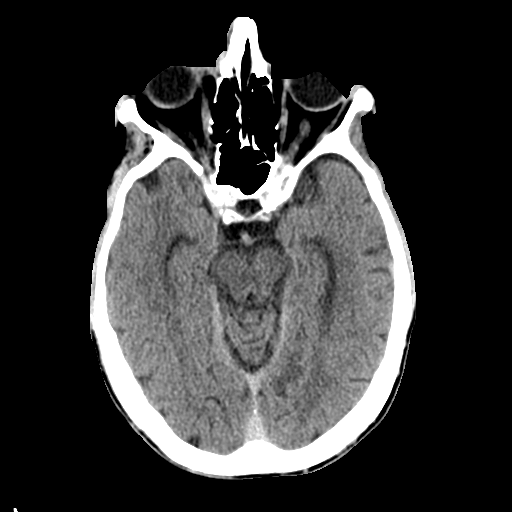
[im 17/33  brain]
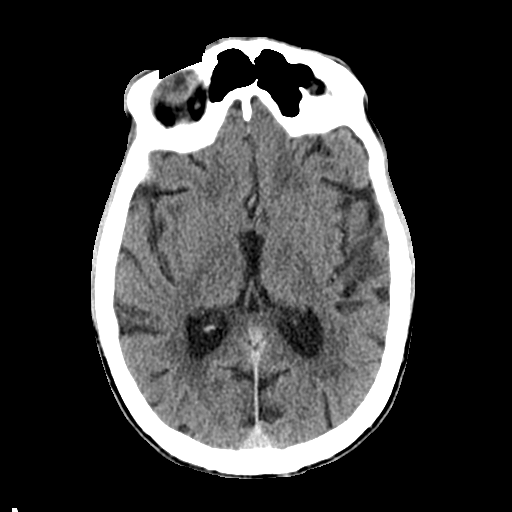
[im 21/33  brain]
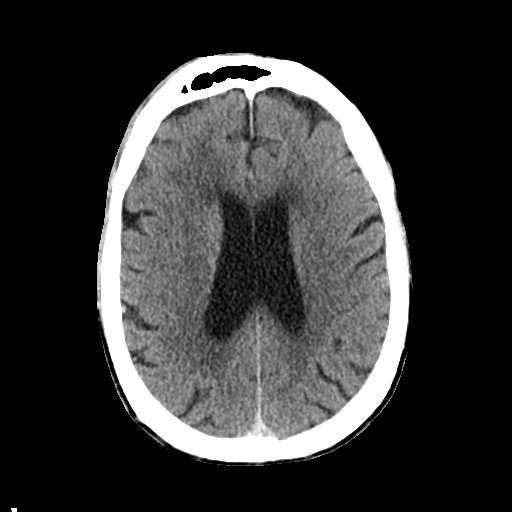
[im 21/33  bone]
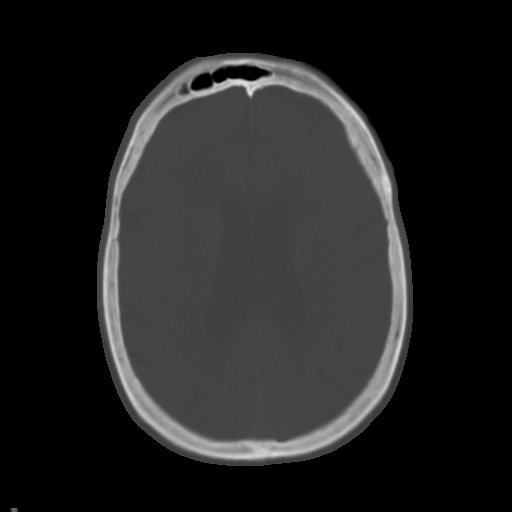
[im 25/33  brain]
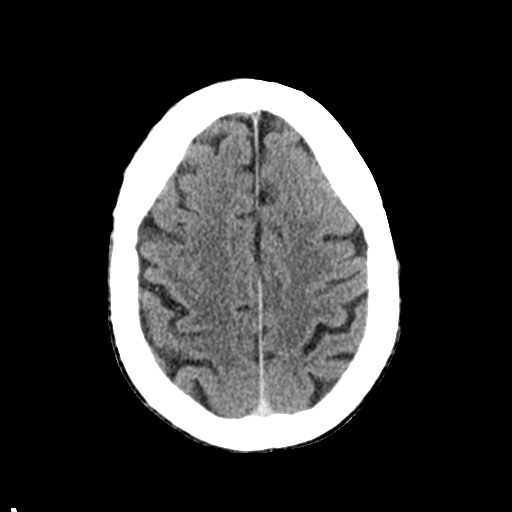
[im 29/33  brain]
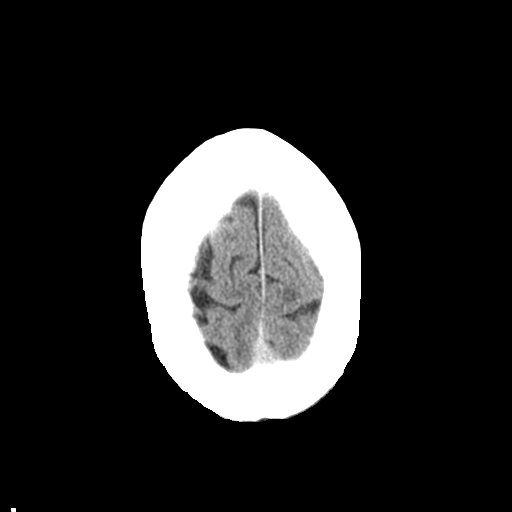

[Series 3: head bone · axial · 0.44mm/px · z∈[+421,+453]mm · 3 of 82 slices shown]
[im 9/82  bone]
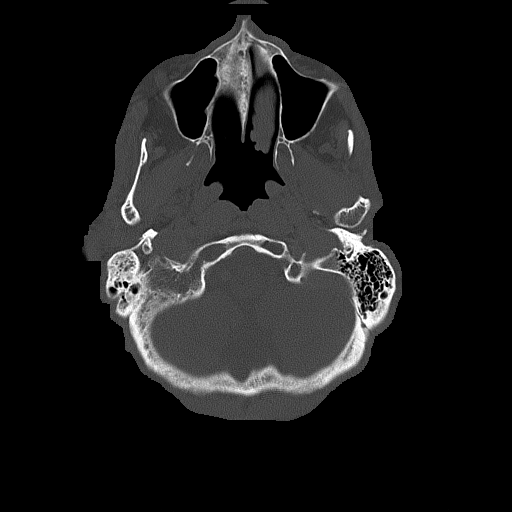
[im 17/82  bone]
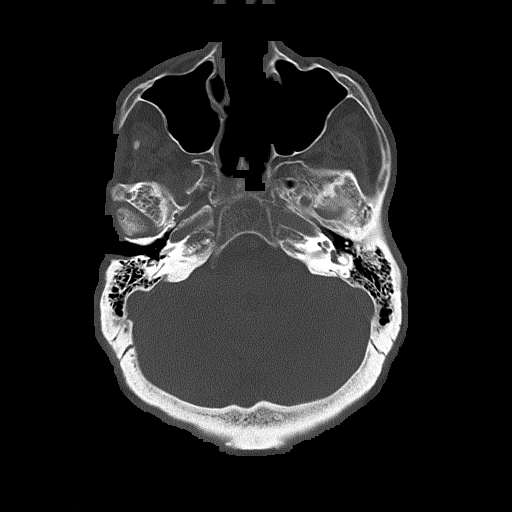
[im 25/82  bone]
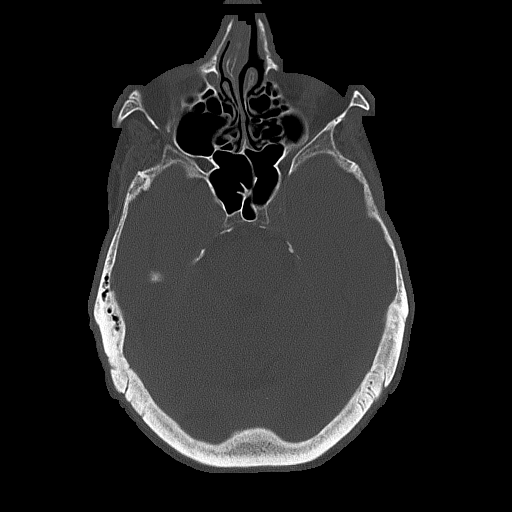

[Series 4: coronal soft tissue · coronal · 0.33mm/px · 3 of 68 slices shown]
[im 23/68  brain]
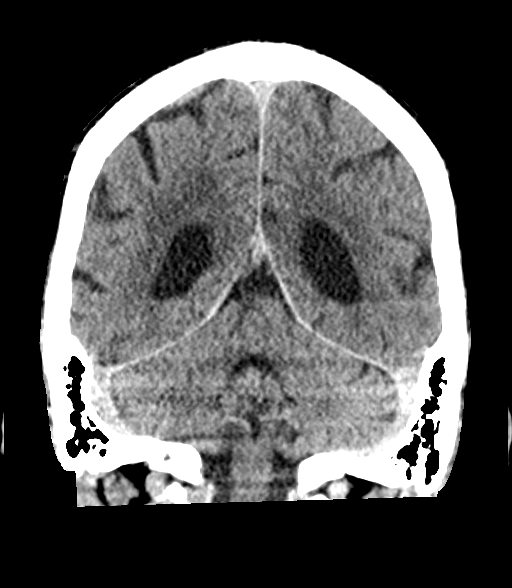
[im 30/68  brain]
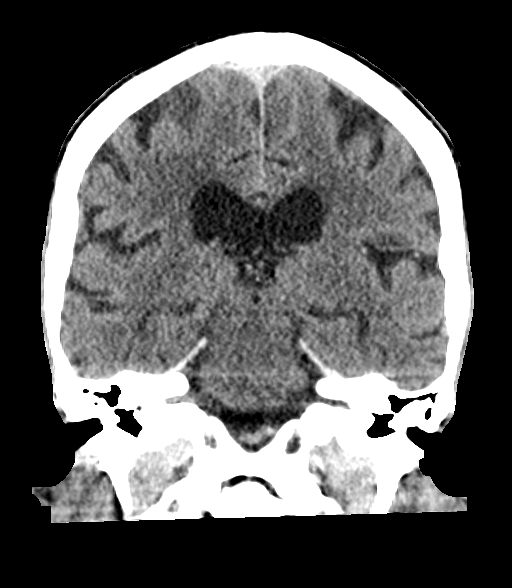
[im 38/68  brain]
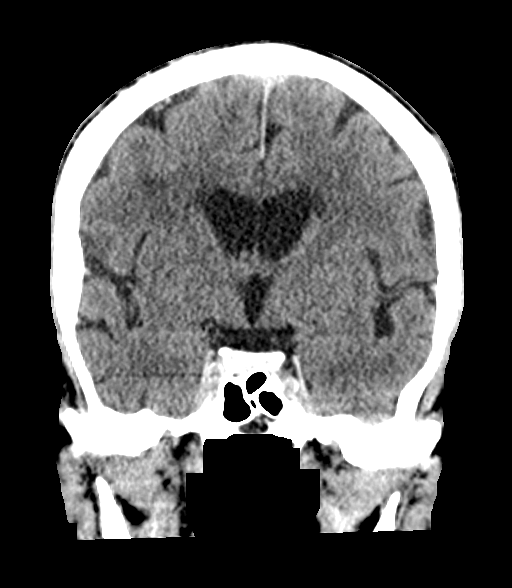

[Series 5: sagittal soft tissue · sagittal · 0.37mm/px · 3 of 50 slices shown]
[im 17/50  brain]
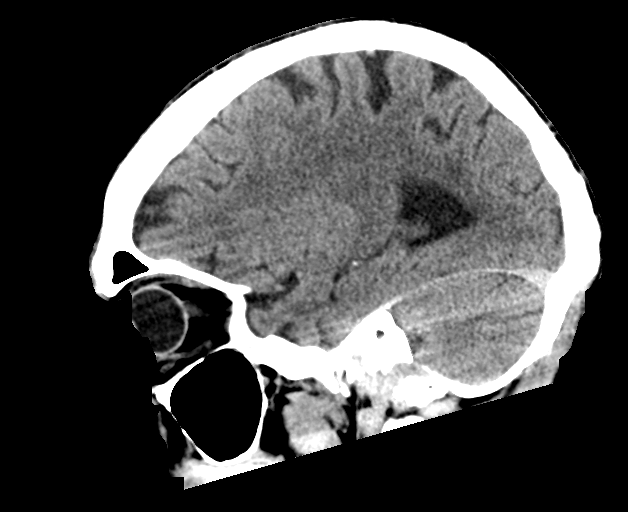
[im 25/50  brain]
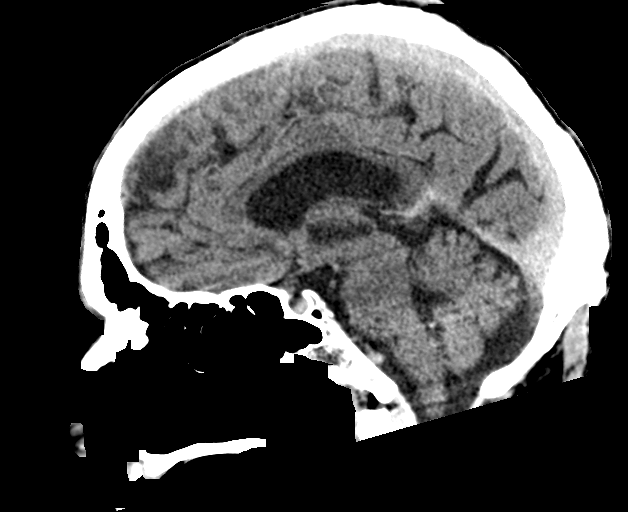
[im 33/50  brain]
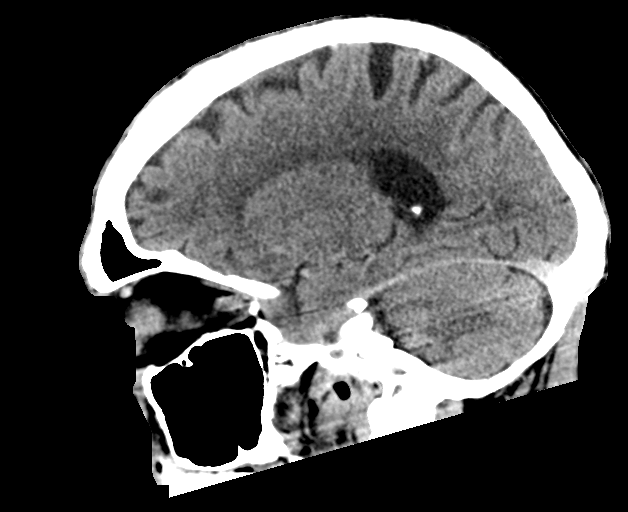

[16 of 47 positions shown; findings below may reference images not displayed]

FINDINGS: Brain: Generalized brain atrophy. Chronic small-vessel ischemic
changes of the cerebral hemispheric white matter. No sign of acute
infarction, mass lesion, hemorrhage, hydrocephalus or extra-axial
collection.

Vascular: There is atherosclerotic calcification of the major
vessels at the base of the brain.

Skull: Negative

Sinuses/Orbits: Sinuses are clear. No postseptal orbital
abnormality. Preseptal edema on the right.

Other: None
IMPRESSION: No acute intracranial finding. Brain atrophy. Chronic small-vessel
ischemic changes of the white matter.

Preseptal soft tissue swelling on the right.

## 2021-03-03 IMAGING — CT CT ORBITS W/ CM
4 of 7 series · 16 of 47 positions shown, 18 images · IV contrast (omnipaque)
Comparison: Head CT [DATE]

CLINICAL DATA: Orbital cellulitis. Right-sided facial and
periorbital redness.

EXAM:
CT ORBITS WITH CONTRAST
TECHNIQUE: Multidetector CT images was performed according to the standard
protocol following intravenous contrast administration.
CONTRAST:  60mL OMNIPAQUE IOHEXOL 300 MG/ML  SOLN

[Series 2: orbits 2.0 hr60 bone · axial · 0.32mm/px · z∈[-214,-116]mm · 8 of 64 slices shown, 10 images]
[im 8/64  brain]
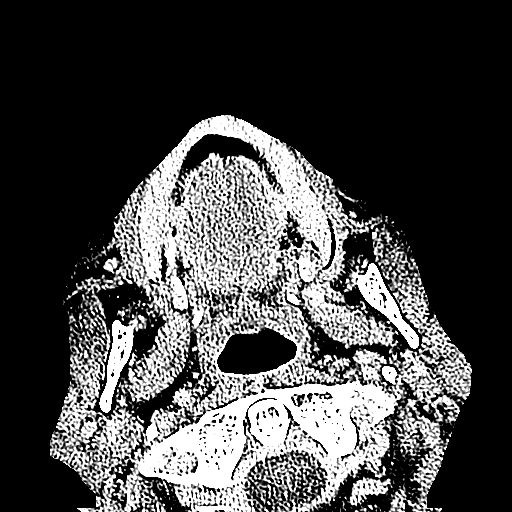
[im 8/64  bone]
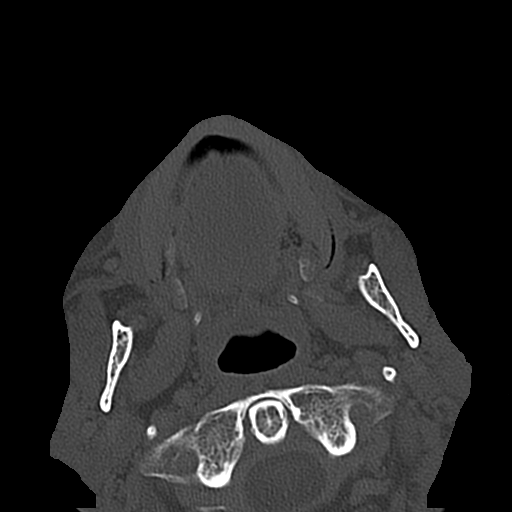
[im 15/64  bone]
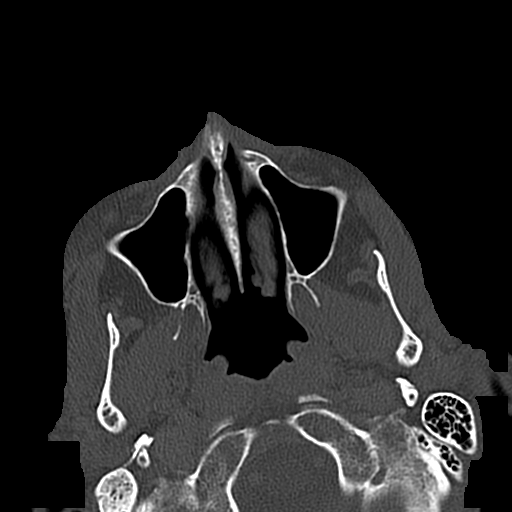
[im 22/64  bone]
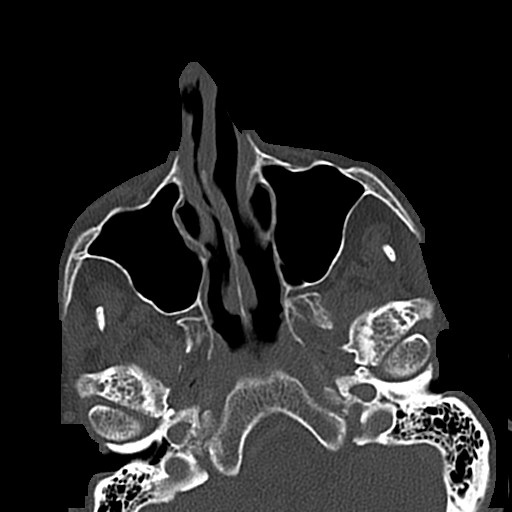
[im 29/64  bone]
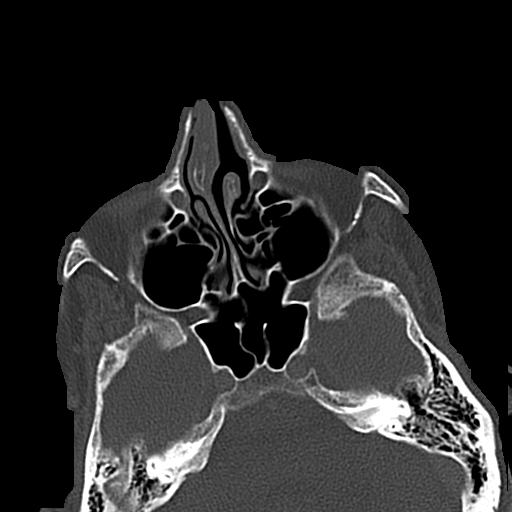
[im 36/64  brain]
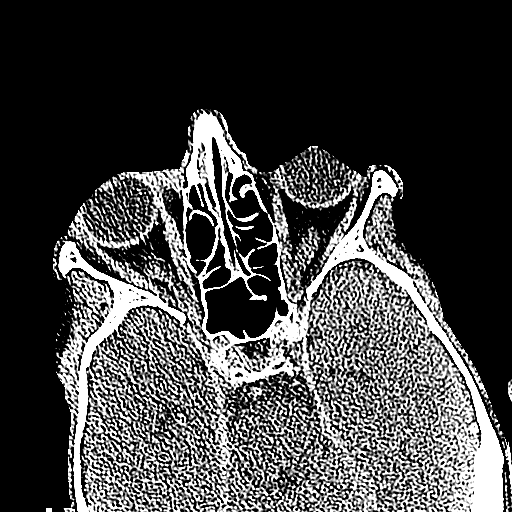
[im 36/64  bone]
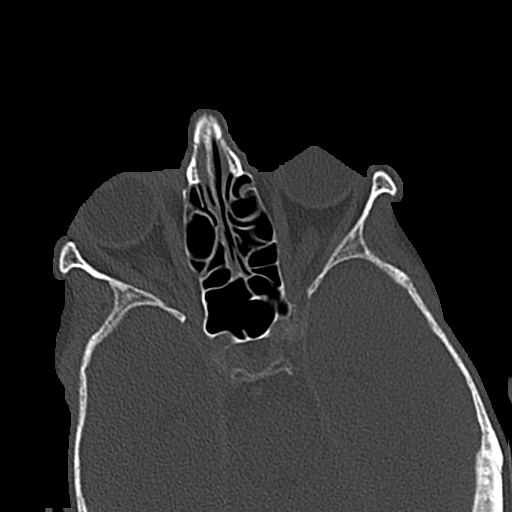
[im 43/64  bone]
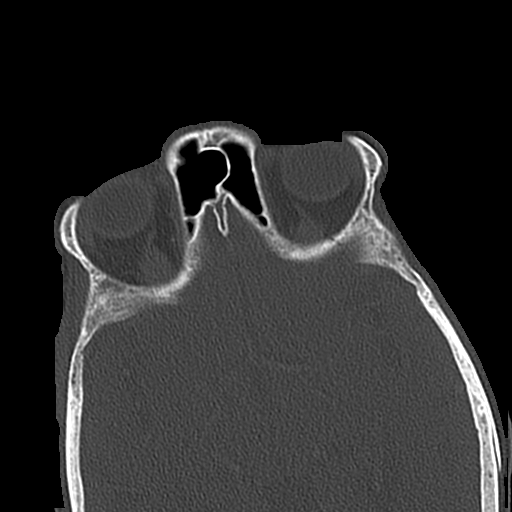
[im 50/64  bone]
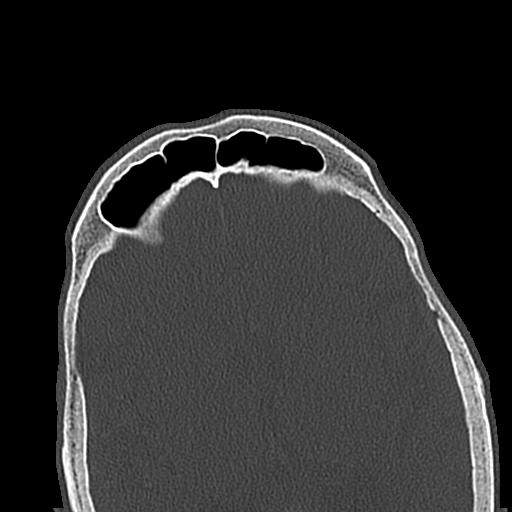
[im 57/64  bone]
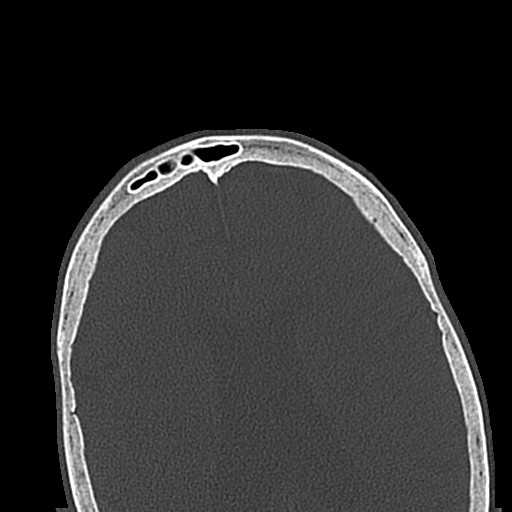

[Series 5: orbits 2.0 mpr ax bone · axial · 0.31mm/px · z∈[-220,-192]mm · 3 of 66 slices shown]
[im 8/66  bone]
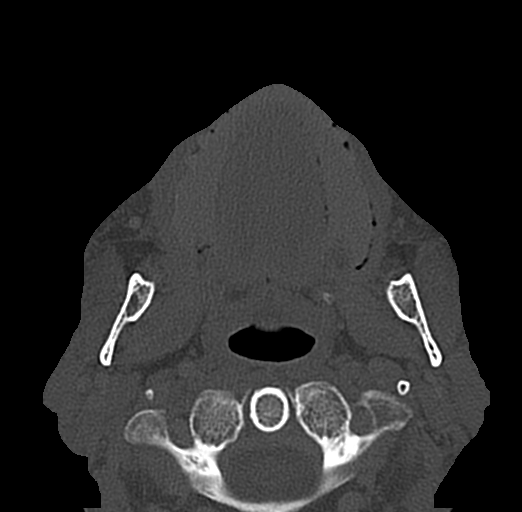
[im 15/66  bone]
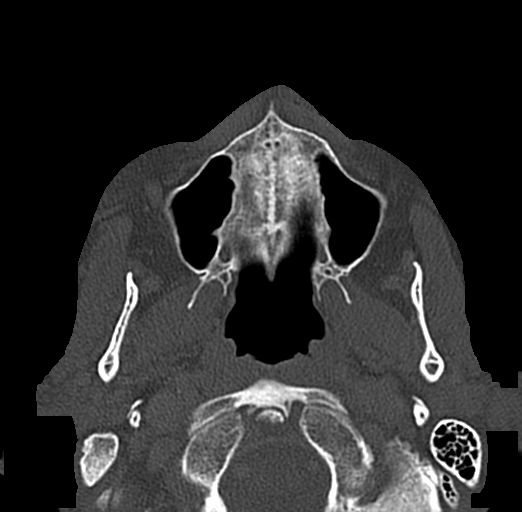
[im 22/66  bone]
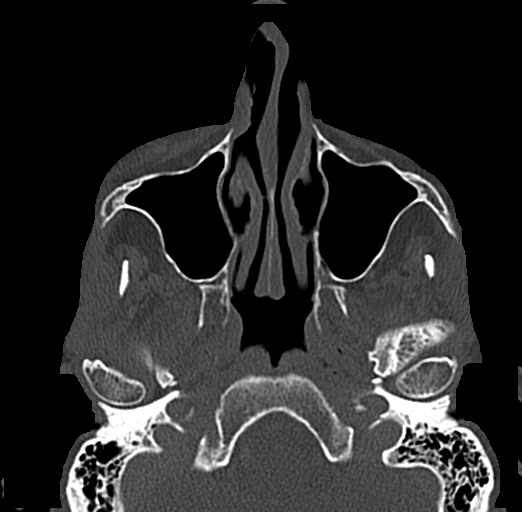

[Series 6: orbits 2.0 coronal · coronal · 0.26mm/px · 3 of 83 slices shown]
[im 21/83  bone]
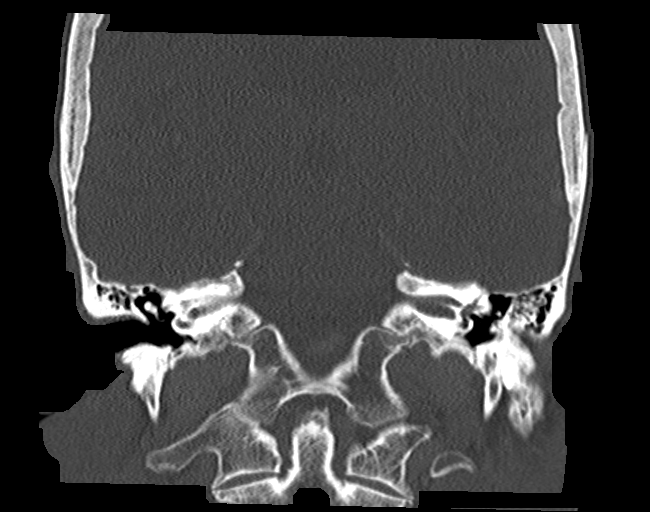
[im 42/83  bone]
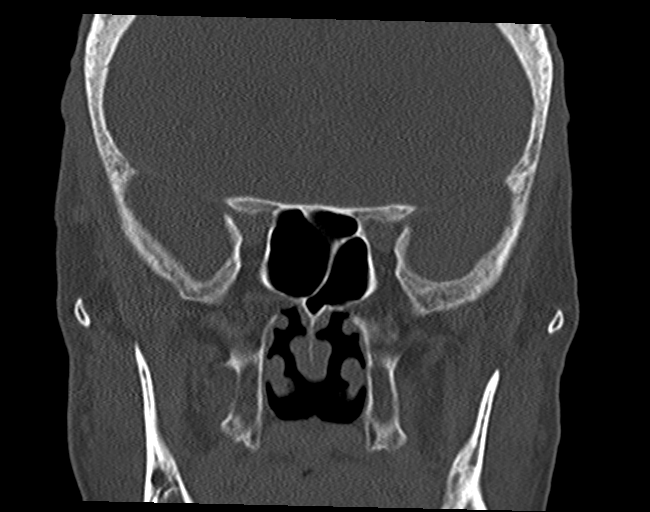
[im 62/83  bone]
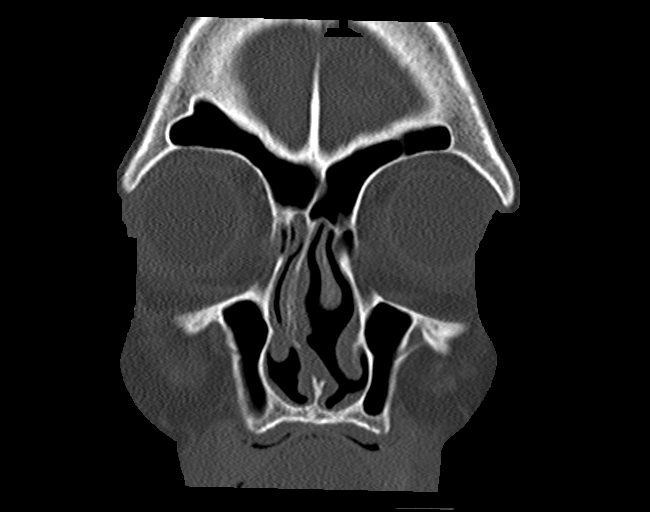

[Series 10: orbits 2.0 sagittal · sagittal · 0.28mm/px · 2 of 75 slices shown]
[im 25/75  bone]
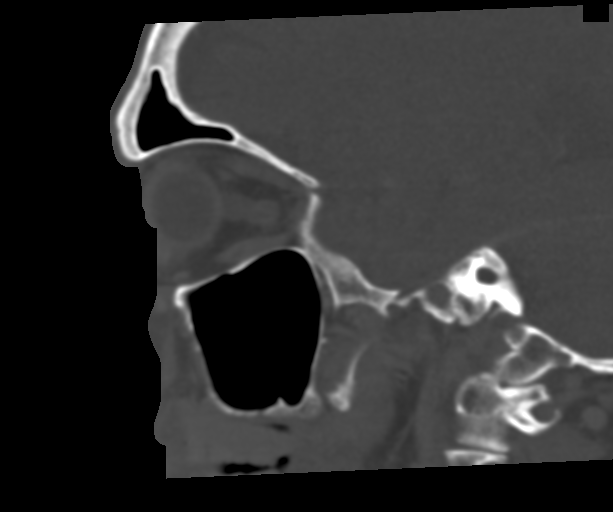
[im 50/75  bone]
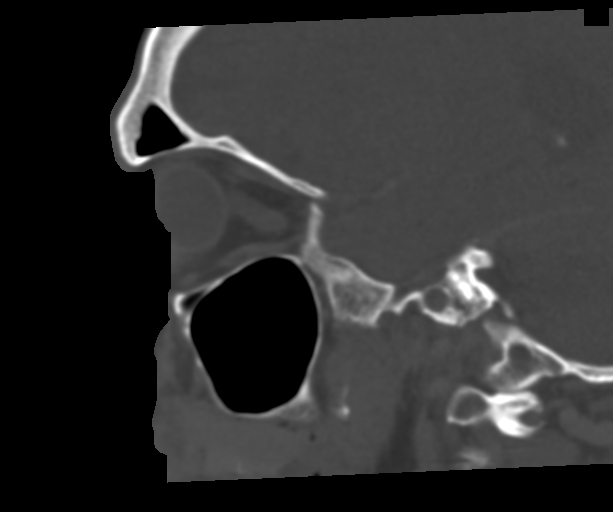

[16 of 47 positions shown; findings below may reference images not displayed]

FINDINGS: Orbits: Mild right periorbital/preseptal soft tissue thickening and
enhancement. No postseptal inflammation, fluid collection, or mass.
Intact globes.

Visible paranasal sinuses: Minimal bilateral ethmoid air cell
mucosal thickening. Clear mastoid air cells.

Soft tissues: Right periorbital inflammation extends superiorly into
the right forehead and inferiorly into the right cheek, incompletely
imaged.

Osseous: No fracture or destructive osseous process.

Limited intracranial: No acute finding.
IMPRESSION: Right-sided facial and periorbital soft tissue inflammation
compatible with cellulitis. No evidence of postseptal cellulitis or
abscess.

## 2021-03-03 MED ORDER — THIAMINE HCL 100 MG/ML IJ SOLN
100.0000 mg | Freq: Every day | INTRAMUSCULAR | Status: DC
  Filled 2021-03-03: qty 2

## 2021-03-03 MED ORDER — THIAMINE HCL 100 MG PO TABS
100.0000 mg | ORAL_TABLET | Freq: Every day | ORAL | Status: DC
  Administered 2021-03-04 – 2021-03-06 (×3): 100 mg via ORAL
  Filled 2021-03-03 (×3): qty 1

## 2021-03-03 MED ORDER — SODIUM CHLORIDE 0.9 % IV BOLUS
1000.0000 mL | Freq: Once | INTRAVENOUS | Status: AC
  Administered 2021-03-03: 1000 mL via INTRAVENOUS

## 2021-03-03 MED ORDER — IOHEXOL 300 MG/ML  SOLN
60.0000 mL | Freq: Once | INTRAMUSCULAR | Status: AC | PRN
  Administered 2021-03-03: 60 mL via INTRAVENOUS

## 2021-03-03 MED ORDER — APIXABAN 5 MG PO TABS
ORAL_TABLET | ORAL | Status: AC
  Administered 2021-03-03: 5 mg via ORAL
  Filled 2021-03-03: qty 1

## 2021-03-03 MED ORDER — PANCRELIPASE (LIP-PROT-AMYL) 12000-38000 UNITS PO CPEP
24000.0000 [IU] | ORAL_CAPSULE | Freq: Three times a day (TID) | ORAL | Status: DC
  Administered 2021-03-04 – 2021-03-06 (×6): 24000 [IU] via ORAL
  Filled 2021-03-03 (×12): qty 2

## 2021-03-03 MED ORDER — ACETAMINOPHEN 650 MG RE SUPP: 650.0000 mg | Freq: Four times a day (QID) | RECTAL | Status: DC | PRN

## 2021-03-03 MED ORDER — TETRACAINE HCL 0.5 % OP SOLN
1.0000 [drp] | Freq: Once | OPHTHALMIC | Status: AC
  Administered 2021-03-03: 1 [drp] via OPHTHALMIC

## 2021-03-03 MED ORDER — ADULT MULTIVITAMIN W/MINERALS CH
ORAL_TABLET | ORAL | Status: AC
  Administered 2021-03-03: 1 via ORAL
  Filled 2021-03-03: qty 1

## 2021-03-03 MED ORDER — THIAMINE HCL 100 MG PO TABS
ORAL_TABLET | ORAL | Status: AC
  Administered 2021-03-03: 100 mg via ORAL
  Filled 2021-03-03: qty 1

## 2021-03-03 MED ORDER — METOPROLOL SUCCINATE ER 25 MG PO TB24
25.0000 mg | ORAL_TABLET | Freq: Every day | ORAL | Status: DC
  Administered 2021-03-05 – 2021-03-06 (×2): 25 mg via ORAL
  Filled 2021-03-03 (×3): qty 1

## 2021-03-03 MED ORDER — LORAZEPAM 1 MG PO TABS
1.0000 mg | ORAL_TABLET | ORAL | Status: DC | PRN
  Filled 2021-03-03: qty 2

## 2021-03-03 MED ORDER — SODIUM CHLORIDE 0.9 % IV SOLN: INTRAVENOUS | Status: DC

## 2021-03-03 MED ORDER — ADULT MULTIVITAMIN W/MINERALS CH
1.0000 | ORAL_TABLET | Freq: Every day | ORAL | Status: DC
  Administered 2021-03-04 – 2021-03-06 (×3): 1 via ORAL
  Filled 2021-03-03 (×3): qty 1

## 2021-03-03 MED ORDER — FLUOXETINE HCL 20 MG PO CAPS
20.0000 mg | ORAL_CAPSULE | Freq: Every day | ORAL | Status: DC
  Administered 2021-03-04 – 2021-03-06 (×3): 20 mg via ORAL
  Filled 2021-03-03 (×3): qty 1

## 2021-03-03 MED ORDER — FOLIC ACID 1 MG PO TABS
ORAL_TABLET | ORAL | Status: AC
  Administered 2021-03-03: 1 mg via ORAL
  Filled 2021-03-03: qty 1

## 2021-03-03 MED ORDER — DEXTROSE 5 % IV SOLN
10.0000 mg/kg | Freq: Two times a day (BID) | INTRAVENOUS | Status: DC
  Administered 2021-03-03 – 2021-03-04 (×3): 810 mg via INTRAVENOUS
  Filled 2021-03-03 (×5): qty 16.2

## 2021-03-03 MED ORDER — FLUORESCEIN SODIUM 1 MG OP STRP
1.0000 | ORAL_STRIP | Freq: Once | OPHTHALMIC | Status: AC
  Administered 2021-03-03: 1 via OPHTHALMIC

## 2021-03-03 MED ORDER — LORAZEPAM 2 MG/ML IJ SOLN: 1.0000 mg | INTRAMUSCULAR | Status: DC | PRN

## 2021-03-03 MED ORDER — ONDANSETRON HCL 4 MG PO TABS: 4.0000 mg | ORAL_TABLET | Freq: Four times a day (QID) | ORAL | Status: DC | PRN

## 2021-03-03 MED ORDER — APIXABAN 5 MG PO TABS
5.0000 mg | ORAL_TABLET | Freq: Two times a day (BID) | ORAL | Status: DC
  Administered 2021-03-04 – 2021-03-06 (×5): 5 mg via ORAL
  Filled 2021-03-03 (×5): qty 1

## 2021-03-03 MED ORDER — FOLIC ACID 1 MG PO TABS
1.0000 mg | ORAL_TABLET | Freq: Every day | ORAL | Status: DC
  Administered 2021-03-04 – 2021-03-06 (×3): 1 mg via ORAL
  Filled 2021-03-03 (×3): qty 1

## 2021-03-03 MED ORDER — LORAZEPAM 2 MG/ML IJ SOLN
0.0000 mg | Freq: Four times a day (QID) | INTRAMUSCULAR | Status: AC
  Administered 2021-03-05: 1 mg via INTRAVENOUS
  Filled 2021-03-03: qty 1

## 2021-03-03 MED ORDER — ACETAMINOPHEN 325 MG PO TABS: 650.0000 mg | ORAL_TABLET | Freq: Four times a day (QID) | ORAL | Status: DC | PRN

## 2021-03-03 MED ORDER — ONDANSETRON HCL 4 MG/2ML IJ SOLN: 4.0000 mg | Freq: Four times a day (QID) | INTRAMUSCULAR | Status: DC | PRN

## 2021-03-03 MED ORDER — LORAZEPAM 2 MG/ML IJ SOLN: 0.0000 mg | Freq: Two times a day (BID) | INTRAMUSCULAR | Status: DC

## 2021-03-03 NOTE — Progress Notes (Signed)
Patient sleeping. NAD, breathing regular and unlabored. Arousable to voice.

## 2021-03-03 NOTE — H&P (Signed)
History and Physical    James Sanford ZOX:[id number] DOB: Aug 24, 1955 DOA: 03/03/2021  PCP: Jerl Mina, MD   Patient coming from: home  I have personally briefly reviewed patient's relevant medical records in [facility]  Chief Complaint: right facial swelling  HPI: James Sanford is a 65 y.o. male with medical history significant for Severe alcohol use disorder, DM, HTN, sleep apnea, paroxysmal A. fib on anticoagulation, depression, history of falls with injury while intoxicated, and history of GI bleed, who presents to the ED with 1 day history of generalized weakness to where he had difficulty getting off the commode, with EMS found him at home.  For the past 1 day he has noted a right-sided facial swelling mostly around the eye associated with rash on the right forehead eyelid and right side of the nose.  He also has difficulty keeping the right eyelid open and some difficulty moving the right eye but denies painful eye movements.  He denies fever or chills, cough or shortness of breath.  ED course: BP elevated to 161/114, temp 99.6.  Vitals otherwise WNL Blood work with WBC 6.9, lactic acid 2.4. Creatinine 1.88, blood glucose 172 AST/ALT 45/19 with total bili 1.6  EKG, personally viewed and interpreted: NSR at 88 with no acute ST-T wave changes  Imaging: CT head with no acute intracranial findings CT orbits with contrast: Right-sided facial and periorbital soft tissue inflammation compatible with cellulitis.  No evidence of post septal cellulitis or abscess  The ED provider spoke with ophthalmologist Dr. Brooke Dare who recommended IV antiretroviral therapy, avoiding ophthalmic steroids in the early part of disease and outpatient ophthalmology follow-up Hospitalist consulted for admission.   Review of Systems: As per HPI otherwise all other systems on review of systems negative.   Assessment/Plan    Herpes zoster ophthalmicus, right eye   Preseptal cellulitis  right eye -Acyclovir IV - Outpatient ophthalmology follow-up per recommendation of ophthalmologist, Dr. Brooke Dare  Lactic acidosis - Likely secondary to alcohol use. - Sepsis not suspected at this time but will continue to monitor  AKI - Uncertain etiology, suspecting prerenal from poor oral intake. - Creatinine 1.88, up from baseline of 0.55 - IV hydration and monitor.  Mild LFT elevation - Likely from alcohol use    Diabetes mellitus type 2, uncomplicated  -Sliding scale coverage    AF (paroxysmal atrial fibrillation) (HCC) - Continue Eliquis and metoprolol    Essential hypertension - Continue metoprolol    Alcohol use disorder, moderate, dependence (HCC) - CIWA withdrawal protocol  History of rectal bleeding - Hemoglobin at baseline  Depression - Continue fluoxetine  Obstructive sleep apnea - CPAP if desired   DVT prophylaxis: Eliquis Code Status: full code  Family Communication:  none  Disposition Plan: Back to previous home environment Consults called: none  Status:At the time of admission, it appears that the appropriate admission status for this patient is INPATIENT. This is judged to be reasonable and necessary in order to provide the required intensity of service to ensure the patient's safety given the presenting symptoms, physical exam findings, and initial radiographic and laboratory data in the context of their  Comorbid conditions.   Patient requires inpatient status due to high intensity of service, high risk for further deterioration and high frequency of surveillance required.   I certify that at the point of admission it is my clinical judgment that the patient will require inpatient hospital care spanning beyond 2 midnights     Physical Exam: Vitals:  03/03/21 1937 03/03/21 1945 03/03/21 2000 03/03/21 2015  BP: (!) 163/102 (!) 160/103 (!) 164/114 (!) 161/104  Pulse: 83 91 87 84  Resp: 18     Temp:      TempSrc:      SpO2: 98% 100% 94% 100%   Weight:      Height:       Constitutional: Alert , oriented x 3 . Not in any apparent distress HEENT:      Head: Normocephalic and atraumatic.         Eyes: PERLA, EOMI, Conjunctival injection right eye. Sclera is non-icteric.         Mouth/Throat: Mucous membranes are moist.       Neck: Supple with no signs of meningismus. Cardiovascular: Regular rate and rhythm. No murmurs, gallops, or rubs. 2+ symmetrical distal pulses are present . No JVD. No  LE edema Respiratory: Respiratory effort normal .Lungs sounds clear bilaterally. No wheezes, crackles, or rhonchi.  Gastrointestinal: Soft, non tender, non distended. Positive bowel sounds.  Genitourinary: No CVA tenderness. Musculoskeletal: Nontender with normal range of motion in all extremities. No cyanosis, or erythema of extremities. Neurologic:  Face is symmetric. Moving all extremities. No gross focal neurologic deficits . Skin: Skin is warm, dry.  Vesicular and crusting lesions of right face, see pic above psychiatric: Mood and affect are appropriate     Past Medical History:  Diagnosis Date   Arthritis    Diabetes ([place])    Hyperlipemia    Hypertension    Sleep apnea     Past Surgical History:  Procedure Laterality Date   COLONOSCOPY WITH PROPOFOL N/A 01/28/2020   Procedure: COLONOSCOPY WITH PROPOFOL;  Surgeon: Lin Landsman, MD;  Location: [facility] ENDOSCOPY;  Service: Gastroenterology;  Laterality: N/A;   COLONOSCOPY WITH PROPOFOL N/A 01/28/2020   Procedure: COLONOSCOPY WITH PROPOFOL;  Surgeon: Lin Landsman, MD;  Location: [facility] ENDOSCOPY;  Service: Gastroenterology;  Laterality: N/A;   ESOPHAGOGASTRODUODENOSCOPY (EGD) WITH PROPOFOL N/A 01/28/2020   Procedure: ESOPHAGOGASTRODUODENOSCOPY (EGD) WITH PROPOFOL;  Surgeon: Lin Landsman, MD;  Location: [facility] ENDOSCOPY;  Service: Gastroenterology;  Laterality: N/A;   GASTRIC BYPASS     HERNIA REPAIR     INTRAMEDULLARY (IM) NAIL INTERTROCHANTERIC Left 11/25/2020    Procedure: INTRAMEDULLARY (IM) NAIL INTERTROCHANTRIC;  Surgeon: Lovell Sheehan, MD;  Location: [facility] ORS;  Service: Orthopedics;  Laterality: Left;   JOINT REPLACEMENT Bilateral    Knee surgery   OPEN REDUCTION INTERNAL FIXATION (ORIF) DISTAL RADIAL FRACTURE Right 10/30/2017   Procedure: OPEN REDUCTION INTERNAL FIXATION (ORIF) DISTAL RADIAL FRACTURE;  Surgeon: Creig Hines, MD;  Location: [facility] ORS;  Service: Orthopedics;  Laterality: Right;   ORIF FEMUR FRACTURE Right 01/29/2020   Procedure: OPEN REDUCTION INTERNAL FIXATION (ORIF) DISTAL FEMUR FRACTURE;  Surgeon: Corky Mull, MD;  Location: [facility] ORS;  Service: Orthopedics;  Laterality: Right;     reports that he has never smoked. He has quit using smokeless tobacco.  His smokeless tobacco use included chew. He reports current alcohol use. He reports that he does not currently use drugs.  Allergies  Allergen Reactions   Penicillins Diarrhea    TOLERATED CEFAZOLIN 11/25/20 Unknown childhood reaction    Family History  Problem Relation Age of Onset   Stroke Mother    Suicidality Father       Prior to Admission medications   Medication Sig Start Date End Date Taking? Authorizing Provider  CREON 24000-76000 units CPEP Take 2 capsules by mouth 3 (three) times daily. 02/10/21  Yes [provider]  ELIQUIS 5 MG TABS tablet Take 5 mg by mouth 2 (two) times daily. 01/05/20  Yes [provider]  escitalopram (LEXAPRO) 10 MG tablet Take 1 tablet by mouth daily.   Yes [provider]  ferrous sulfate 325 (65 FE) MG tablet Take 1 tablet (325 mg total) by mouth every other day. 12/27/20  Yes Shawna Clamp, MD  FLUoxetine (PROZAC) 20 MG capsule Take 1 capsule (20 mg total) by mouth daily. 12/27/20  Yes Shawna Clamp, MD  metoprolol succinate (TOPROL-XL) 50 MG 24 hr tablet Take 0.5 tablets (25 mg total) by mouth daily. 02/06/20  Yes Fritzi Mandes, MD  Saccharomyces boulardii (PROBIOTIC) 250 MG CAPS Take 1 capsule by mouth in  the morning and at bedtime. 11/24/20  Yes Merlyn Lot, MD  HYDROcodone-acetaminophen (NORCO/VICODIN) 5-325 MG tablet Take 1 tablet by mouth every 6 (six) hours as needed for moderate pain. Patient not taking: Reported on 03/03/2021 11/26/20   Carlynn Spry, PA-C      Labs on Admission: I have personally reviewed following labs and imaging studies  CBC: Recent Labs  Lab 03/03/21 1616  WBC 6.9  NEUTROABS 5.4  HGB 11.8*  HCT 37.1*  MCV 93.5  PLT [id number]*   Basic Metabolic Panel: Recent Labs  Lab 03/03/21 1616  NA 137  K 4.0  CL 100  CO2 25  GLUCOSE 172*  BUN 28*  CREATININE 1.88*  CALCIUM 8.3*   GFR: Estimated Creatinine Clearance: 44.1 mL/min (A) (by C-G formula based on SCr of 1.88 mg/dL (H)). Liver Function Tests: Recent Labs  Lab 03/03/21 1616  AST 45*  ALT 19  ALKPHOS 98  BILITOT 1.6*  PROT 6.9  ALBUMIN 3.6   No results for input(s): LIPASE, AMYLASE in the last 168 hours. No results for input(s): AMMONIA in the last 168 hours. Coagulation Profile: No results for input(s): INR, PROTIME in the last 168 hours. Cardiac Enzymes: No results for input(s): CKTOTAL, CKMB, CKMBINDEX, TROPONINI in the last 168 hours. BNP (last 3 results) No results for input(s): PROBNP in the last 8760 hours. HbA1C: No results for input(s): HGBA1C in the last 72 hours. CBG: No results for input(s): GLUCAP in the last 168 hours. Lipid Profile: No results for input(s): CHOL, HDL, LDLCALC, TRIG, CHOLHDL, LDLDIRECT in the last 72 hours. Thyroid Function Tests: No results for input(s): TSH, T4TOTAL, FREET4, T3FREE, THYROIDAB in the last 72 hours. Anemia Panel: No results for input(s): VITAMINB12, FOLATE, FERRITIN, TIBC, IRON, RETICCTPCT in the last 72 hours. Urine analysis:    Component Value Date/Time   COLORURINE YELLOW 11/23/2020 2130   APPEARANCEUR CLOUDY (A) 11/23/2020 2130   LABSPEC >1.030 (H) 11/23/2020 2130   PHURINE 5.0 11/23/2020 2130   GLUCOSEU NEGATIVE 11/23/2020  2130   HGBUR TRACE (A) 11/23/2020 2130   BILIRUBINUR SMALL (A) 11/23/2020 2130   KETONESUR NEGATIVE 11/23/2020 2130   PROTEINUR TRACE (A) 11/23/2020 2130   NITRITE POSITIVE (A) 11/23/2020 2130   LEUKOCYTESUR TRACE (A) 11/23/2020 2130    Radiological Exams on Admission: CT Head Wo Contrast  Result Date: 03/03/2021 CLINICAL DATA:  Neurological deficit, acute, stroke suspected. EXAM: CT HEAD WITHOUT CONTRAST TECHNIQUE: Contiguous axial images were obtained from the base of the skull through the vertex without intravenous contrast. COMPARISON:  Orbital CT earlier same day. FINDINGS: Brain: Generalized brain atrophy. Chronic small-vessel ischemic changes of the cerebral hemispheric white matter. No sign of acute infarction, mass lesion, hemorrhage, hydrocephalus or extra-axial collection. Vascular: There is atherosclerotic calcification of  the major vessels at the base of the brain. Skull: Negative Sinuses/Orbits: Sinuses are clear. No postseptal orbital abnormality. Preseptal edema on the right. Other: None IMPRESSION: No acute intracranial finding. Brain atrophy. Chronic small-vessel ischemic changes of the white matter. Preseptal soft tissue swelling on the right. Electronically Signed   By: Nelson Chimes M.D.   On: 03/03/2021 19:21   CT Orbits W Contrast  Result Date: 03/03/2021 CLINICAL DATA:  Orbital cellulitis. Right-sided facial and periorbital redness. EXAM: CT ORBITS WITH CONTRAST TECHNIQUE: Multidetector CT images was performed according to the standard protocol following intravenous contrast administration. CONTRAST:  78mL OMNIPAQUE IOHEXOL 300 MG/ML  SOLN COMPARISON:  Head CT 11/23/2020 FINDINGS: Orbits: Mild right periorbital/preseptal soft tissue thickening and enhancement. No postseptal inflammation, fluid collection, or mass. Intact globes. Visible paranasal sinuses: Minimal bilateral ethmoid air cell mucosal thickening. Clear mastoid air cells. Soft tissues: Right periorbital  inflammation extends superiorly into the right forehead and inferiorly into the right cheek, incompletely imaged. Osseous: No fracture or destructive osseous process. Limited intracranial: No acute finding. IMPRESSION: Right-sided facial and periorbital soft tissue inflammation compatible with cellulitis. No evidence of postseptal cellulitis or abscess. Electronically Signed   By: Logan Bores M.D.   On: 03/03/2021 18:44       Athena Masse MD [facility]   03/03/2021, 8:45 PM

## 2021-03-03 NOTE — Consult Note (Signed)
Pharmacy Antibiotic Note  James Sanford is a 65 y.o. male admitted on 03/03/2021 with rt-sided periorbital erythema and swelling extending to forearm w/ scatter vessicles along nose concerning for  herpes zoster (shingles) .  Pharmacy has been consulted for Acyclovir dosing.  Plan: Initiate Acyclovir 10mg /kg (ActBW of 81.2kg) --> 810mg  q12h per current indication and renal function. Conc <7mg /mL and infused over 1hr Infuse with MIVF NS @125ml /h to prevent renal toxicity.  CTM for: Signs of neurotoxicity (confusion, agitation, or hallucination) typically within the first 3-5 days of therapy Signs of nephrotoxicity within 48 hours of administration (given with MIVF to reduce risk) and eliminate other renal toxic agents. Signs of phlebitis. (Infusion rate and concentration appropriate)  Height: 6' (182.9 cm) Weight: 81.2 kg (179 lb) IBW/kg (Calculated) : 77.6  Temp (24hrs), Avg:99.6 F (37.6 C), Min:99.6 F (37.6 C), Max:99.6 F (37.6 C)  Recent Labs  Lab 03/03/21 1616  WBC 6.9  CREATININE 1.88*  LATICACIDVEN 2.4*    Estimated Creatinine Clearance: 44.1 mL/min (A) (by C-G formula based on SCr of 1.88 mg/dL (H)).    Allergies  Allergen Reactions   Penicillins Diarrhea    TOLERATED CEFAZOLIN 11/25/20 Unknown childhood reaction    Antimicrobials this admission: Acyclovir (11/29 >>   Dose adjustments this admission: CTM and adjust PRN  Microbiology results: 11/29 Flu/Cov: pending  Thank you for allowing pharmacy to be a part of this patient's care.  11/27/20 03/03/2021 7:44 PM

## 2021-03-03 NOTE — ED Triage Notes (Addendum)
Pt arrives via ems from home, pt was found on the toilet, pt has a large amount of redness noted to the right side of his face, periorbital and onto the forehead. Pt's left eye appears dry with crusts along with eyelid, pt denies injury, pt has a line of demarcation to the center of his forehead from redness to white coloration on the left side of his forehead. Ems reports poor living conditions and that the wife had been at work today but was there for their arrival Pt reports all over body pain, ems reports that the pt goes to the bathroom to do most everything including drink alcohol and endorsed SI to ems Pt has pustules noted on the right side of his nose, and raised areas noted to the right side of his forehead

## 2021-03-03 NOTE — ED Provider Notes (Signed)
Emergency Medicine Provider Triage Evaluation Note  James Sanford , a 65 y.o. male  was evaluated in triage.  Pt complains of right-sided periorbital erythema that extends to the forehead.  Patient does have a few scattered vesicles along the nose.  Patient denies any prodrome of burning.  Patient states that it is difficult for him to move his right eye and he has been having a difficulty keeping his right eye open.  Review of Systems  Positive: Patient has right eye pain and periorbital erythema.  Negative: No chest pain or chest tightness.   Physical Exam  There were no vitals taken for this visit. Gen:   Awake, no distress   Resp:  Normal effort  MSK:   Moves extremities without difficulty  Other:    Medical Decision Making  Medically screening exam initiated at 4:03 PM.  Appropriate orders placed.  Nolawi Luisa Hart Scheffel was informed that the remainder of the evaluation will be completed by another provider, this initial triage assessment does not replace that evaluation, and the importance of remaining in the ED until their evaluation is complete.     Pia Mau [place], PA-C 03/03/21 1604    Delton [place], MD 03/03/21 [phone]

## 2021-03-03 NOTE — ED Provider Notes (Signed)
[facility] Emergency Department Provider Note   ____________________________________________   Event Date/Time   First MD Initiated Contact with Patient 03/03/21 1820     (approximate)  I have reviewed the triage vital signs and the nursing notes.  HISTORY  Chief Complaint Generalized Body Aches and Eye Drainage    HPI James Sanford is a 65 y.o. male who presents for multiple symptoms including generalized weakness, left-sided facial erythema and eye swelling, as well as bilateral lower extremity weakness.  Patient and wife at bedside states that he has been having right-sided facial erythema that began approximately 4 days prior to arrival and extends to the forehead but does not cross midline and is associated with scattered vesicles along the nose and on the eyelid.  Patient also says that he has difficulty moving his right eye as well as difficulty keeping the right eyelid open.  Patient states he has had associated fatigue since this time as well including bilateral lower extremity edema.  Patient denies any exacerbating or relieving factors.  Patient has not tried any medications at home for the symptoms          Past Medical History:  Diagnosis Date   Arthritis    Diabetes (HCC)    Hyperlipemia    Hypertension    Sleep apnea     Patient Active Problem List   Diagnosis Date Noted   Iron deficiency anemia    Hypokalemia    Diarrhea    Impaired fasting glucose    Malnutrition of moderate degree 12/02/2020   Protein-calorie malnutrition, severe 11/25/2020   Moderate major depression, single episode (HCC) 11/24/2020   Closed nondisplaced intertrochanteric fracture of left femur (HCC) 11/24/2020   Acute lower UTI 11/24/2020   Hyperlipemia    Acute GI bleeding 01/25/2020   Diabetes mellitus type 2, uncomplicated (HCC) 01/25/2020   Hyperlipidemia 01/25/2020   Obesity 01/25/2020   Essential hypertension 01/25/2020   Sleep apnea  01/25/2020   Alcohol abuse 03/07/2019   Cardiomyopathy, idiopathic (HCC) 03/07/2019   AF (paroxysmal atrial fibrillation) (HCC) 11/07/2017   Radius fracture 10/29/2017    Past Surgical History:  Procedure Laterality Date   COLONOSCOPY WITH PROPOFOL N/A 01/28/2020   Procedure: COLONOSCOPY WITH PROPOFOL;  Surgeon: Toney Reil, MD;  Location: [facility] ENDOSCOPY;  Service: Gastroenterology;  Laterality: N/A;   COLONOSCOPY WITH PROPOFOL N/A 01/28/2020   Procedure: COLONOSCOPY WITH PROPOFOL;  Surgeon: Toney Reil, MD;  Location: [facility] ENDOSCOPY;  Service: Gastroenterology;  Laterality: N/A;   ESOPHAGOGASTRODUODENOSCOPY (EGD) WITH PROPOFOL N/A 01/28/2020   Procedure: ESOPHAGOGASTRODUODENOSCOPY (EGD) WITH PROPOFOL;  Surgeon: Toney Reil, MD;  Location: [facility] ENDOSCOPY;  Service: Gastroenterology;  Laterality: N/A;   GASTRIC BYPASS     HERNIA REPAIR     INTRAMEDULLARY (IM) NAIL INTERTROCHANTERIC Left 11/25/2020   Procedure: INTRAMEDULLARY (IM) NAIL INTERTROCHANTRIC;  Surgeon: Lyndle Herrlich, MD;  Location: [facility] ORS;  Service: Orthopedics;  Laterality: Left;   JOINT REPLACEMENT Bilateral    Knee surgery   OPEN REDUCTION INTERNAL FIXATION (ORIF) DISTAL RADIAL FRACTURE Right 10/30/2017   Procedure: OPEN REDUCTION INTERNAL FIXATION (ORIF) DISTAL RADIAL FRACTURE;  Surgeon: Garnette Gunner, MD;  Location: [facility] ORS;  Service: Orthopedics;  Laterality: Right;   ORIF FEMUR FRACTURE Right 01/29/2020   Procedure: OPEN REDUCTION INTERNAL FIXATION (ORIF) DISTAL FEMUR FRACTURE;  Surgeon: Christena Flake, MD;  Location: [facility] ORS;  Service: Orthopedics;  Laterality: Right;    Prior to Admission medications   Medication Sig Start Date End  Date Taking? Authorizing Provider  ELIQUIS 5 MG TABS tablet Take 5 mg by mouth 2 (two) times daily. 01/05/20   [provider]  ferrous sulfate 325 (65 FE) MG tablet Take 1 tablet (325 mg total) by mouth every other day. 12/27/20   Shawna Clamp, MD   FLUoxetine (PROZAC) 20 MG capsule Take 1 capsule (20 mg total) by mouth daily. 12/27/20   Shawna Clamp, MD  HYDROcodone-acetaminophen (NORCO/VICODIN) 5-325 MG tablet Take 1 tablet by mouth every 6 (six) hours as needed for moderate pain. 11/26/20   Carlynn Spry, PA-C  metoprolol succinate (TOPROL-XL) 50 MG 24 hr tablet Take 0.5 tablets (25 mg total) by mouth daily. 02/06/20   Fritzi Mandes, MD  Saccharomyces boulardii (PROBIOTIC) 250 MG CAPS Take 1 capsule by mouth in the morning and at bedtime. 11/24/20   Merlyn Lot, MD    Allergies Penicillins  Family History  Problem Relation Age of Onset   Stroke Mother    Suicidality Father     Social History Social History   Tobacco Use   Smoking status: Never   Smokeless tobacco: Former    Types: Chew   Tobacco comments:    quir about 4 years ago  Vaping Use   Vaping Use: Never used  Substance Use Topics   Alcohol use: Yes    Comment: beer occasionally   Drug use: Not Currently    Review of Systems Constitutional: No fever/chills Eyes: Endorses blurred vision in the right eye ENT: No sore throat. Cardiovascular: Denies chest pain. Respiratory: Denies shortness of breath. Gastrointestinal: No abdominal pain.  No nausea, no vomiting.  No diarrhea. Genitourinary: Negative for dysuria. Musculoskeletal: Negative for acute arthralgias Skin: Endorses rash to the right forehead and over right eye Neurological: Negative for headaches, weakness/numbness/paresthesias in any extremity Psychiatric: Negative for suicidal ideation/homicidal ideation   ____________________________________________   PHYSICAL EXAM:  VITAL SIGNS: ED Triage Vitals  Enc Vitals Group     BP 03/03/21 1609 (!) 136/91     Pulse Rate 03/03/21 1609 93     Resp 03/03/21 1609 16     Temp 03/03/21 1609 99.6 F (37.6 C)     Temp Source 03/03/21 1609 Oral     SpO2 03/03/21 1627 100 %     Weight 03/03/21 1613 179 lb (81.2 kg)     Height 03/03/21 1613 6'  (1.829 m)     Head Circumference --      Peak Flow --      Pain Score 03/03/21 1613 0     Pain Loc --      Pain Edu? --      Excl. in [place]? --    Constitutional: Alert and oriented. Well appearing and in no acute distress. Eyes: Conjunctivae is injected on the right with erythema and edema of the right eyelid as well as decreased visual acuity.  Fluorescein exam shows no dendritic lesions.  Left eye conjunctive a normal and visual acuity intact.  PERRL. Head: Atraumatic. Nose: No congestion/rhinnorhea. Mouth/Throat: Mucous membranes are moist. Neck: No stridor Cardiovascular: Grossly normal heart sounds.  Good peripheral circulation. Respiratory: Normal respiratory effort.  No retractions. Gastrointestinal: Soft and nontender. No distention. Musculoskeletal: No obvious deformities Neurologic:  Normal speech and language. No gross focal neurologic deficits are appreciated. Skin:  Skin is warm and dry.  Erythema over the right forehead and right aspect of the face above the zygomatic arch including the eyelid and nose that does not cross midline and has associated vesicles  over the nose Psychiatric: Mood and affect are normal. Speech and behavior are normal.  ____________________________________________   LABS (all labs ordered are listed, but only abnormal results are displayed)  Labs Reviewed  CBC WITH DIFFERENTIAL/PLATELET - Abnormal; Notable for the following components:      Result Value   RBC 3.97 (*)    Hemoglobin 11.8 (*)    HCT 37.1 (*)    Platelets 123 (*)    Lymphs Abs 0.5 (*)    All other components within normal limits  COMPREHENSIVE METABOLIC PANEL - Abnormal; Notable for the following components:   Glucose, Bld 172 (*)    BUN 28 (*)    Creatinine, Ser 1.88 (*)    Calcium 8.3 (*)    AST 45 (*)    Total Bilirubin 1.6 (*)    GFR, Estimated 40 (*)    All other components within normal limits  LACTIC ACID, PLASMA - Abnormal; Notable for the following components:    Lactic Acid, Venous 2.4 (*)    All other components within normal limits  RESP PANEL BY RT-PCR (FLU A&B, COVID) [id number]  LACTIC ACID, PLASMA  URINALYSIS, COMPLETE (UACMP) WITH MICROSCOPIC   ____________________________________________  EKG  ED ECG REPORT I, Naaman Plummer, the attending physician, personally viewed and interpreted this ECG.  Date: 03/03/2021 EKG Time: 1613 Rate: 88 Rhythm: normal sinus rhythm QRS Axis: normal Intervals: normal ST/T Wave abnormalities: normal Narrative Interpretation: no evidence of acute ischemia  ____________________________________________  RADIOLOGY  ED MD interpretation: CT of the head without contrast shows no evidence of acute abnormalities including no intracerebral hemorrhage, obvious masses, or significant edema  CT of the orbits with contrast shows right facial and periorbital soft tissue inflammation  Official radiology report(s): CT Head Wo Contrast  Result Date: 03/03/2021 CLINICAL DATA:  Neurological deficit, acute, stroke suspected. EXAM: CT HEAD WITHOUT CONTRAST TECHNIQUE: Contiguous axial images were obtained from the base of the skull through the vertex without intravenous contrast. COMPARISON:  Orbital CT earlier same day. FINDINGS: Brain: Generalized brain atrophy. Chronic small-vessel ischemic changes of the cerebral hemispheric white matter. No sign of acute infarction, mass lesion, hemorrhage, hydrocephalus or extra-axial collection. Vascular: There is atherosclerotic calcification of the major vessels at the base of the brain. Skull: Negative Sinuses/Orbits: Sinuses are clear. No postseptal orbital abnormality. Preseptal edema on the right. Other: None IMPRESSION: No acute intracranial finding. Brain atrophy. Chronic small-vessel ischemic changes of the white matter. Preseptal soft tissue swelling on the right. Electronically Signed   By: Nelson Chimes M.D.   On: 03/03/2021 19:21   CT Orbits W Contrast  Result Date:  03/03/2021 CLINICAL DATA:  Orbital cellulitis. Right-sided facial and periorbital redness. EXAM: CT ORBITS WITH CONTRAST TECHNIQUE: Multidetector CT images was performed according to the standard protocol following intravenous contrast administration. CONTRAST:  88mL OMNIPAQUE IOHEXOL 300 MG/ML  SOLN COMPARISON:  Head CT 11/23/2020 FINDINGS: Orbits: Mild right periorbital/preseptal soft tissue thickening and enhancement. No postseptal inflammation, fluid collection, or mass. Intact globes. Visible paranasal sinuses: Minimal bilateral ethmoid air cell mucosal thickening. Clear mastoid air cells. Soft tissues: Right periorbital inflammation extends superiorly into the right forehead and inferiorly into the right cheek, incompletely imaged. Osseous: No fracture or destructive osseous process. Limited intracranial: No acute finding. IMPRESSION: Right-sided facial and periorbital soft tissue inflammation compatible with cellulitis. No evidence of postseptal cellulitis or abscess. Electronically Signed   By: Logan Bores M.D.   On: 03/03/2021 18:44    ____________________________________________   PROCEDURES  Procedure(s) performed (including Critical Care):  .1-3 Lead EKG Interpretation Performed by: Naaman Plummer, MD Authorized by: Naaman Plummer, MD     Interpretation: normal     ECG rate:  82   ECG rate assessment: normal     Rhythm: sinus rhythm     Ectopy: none     Conduction: normal     ____________________________________________   INITIAL IMPRESSION / ASSESSMENT AND PLAN / ED COURSE  As part of my medical decision making, I reviewed the following data within the electronic medical record, if available:  Nursing notes reviewed and incorporated, Labs reviewed, EKG interpreted, Old chart reviewed, Radiograph reviewed and Notes from prior ED visits reviewed and incorporated      This patient presents with generalized weakness and fatigue likely secondary to dehydration and herpes  zoster ophthalmicus. Suspect acute kidney injury of prerenal origin. Doubt intrinsic renal dysfunction or obstructive nephropathy. Considered alternate etiologies of the patients symptoms including infectious processes, severe metabolic derangements or electrolyte abnormalities, ischemia/ACS, heart failure, and intracranial/central processes but think these are unlikely given the history and physical exam.  Plan: labs, 1 L NS fluid resuscitation, pain/nausea control, reassessment Consult: Ophthalmology-I spoke to Dr. Edison Pace regarding appropriate treatment of patient's suspected herpes zoster ophthalmicus.  Recommends only IV antiviral therapy and to avoid ophthalmic steroids especially in the early portion of disease Dispo: Admit to medicine      ____________________________________________   FINAL CLINICAL IMPRESSION(S) / ED DIAGNOSES  Final diagnoses:  AKI (acute kidney injury) ([place])  Dehydration  Herpes zoster ophthalmicus of right eye  Weakness of both lower extremities     ED Discharge Orders     None        Note:  This document was prepared using Dragon voice recognition software and may include unintentional dictation errors.    Naaman Plummer, MD 03/03/21 2011

## 2021-03-04 DIAGNOSIS — N390 Urinary tract infection, site not specified: Secondary | ICD-10-CM

## 2021-03-04 DIAGNOSIS — N179 Acute kidney failure, unspecified: Secondary | ICD-10-CM

## 2021-03-04 DIAGNOSIS — I480 Paroxysmal atrial fibrillation: Secondary | ICD-10-CM

## 2021-03-04 LAB — URINALYSIS, COMPLETE (UACMP) WITH MICROSCOPIC
Glucose, UA: NEGATIVE mg/dL
Nitrite: POSITIVE — AB
Protein, ur: 30 mg/dL — AB
Specific Gravity, Urine: 1.02 (ref 1.005–1.030)
WBC, UA: >50 WBC/hpf (ref 0–5)
pH: 5.5 (ref 5.0–8.0)

## 2021-03-04 LAB — HIV ANTIBODY (ROUTINE TESTING W REFLEX): HIV Screen 4th Generation wRfx: NONREACTIVE

## 2021-03-04 LAB — BASIC METABOLIC PANEL
Anion gap: 3 — ABNORMAL LOW (ref 5–15)
BUN: 19 mg/dL (ref 8–23)
CO2: 28 mmol/L (ref 22–32)
Calcium: 7.6 mg/dL — ABNORMAL LOW (ref 8.9–10.3)
Chloride: 103 mmol/L (ref 98–111)
Creatinine, Ser: 0.78 mg/dL (ref 0.61–1.24)
GFR, Estimated: >60 mL/min (ref >60)
Glucose, Bld: 124 mg/dL — ABNORMAL HIGH (ref 70–99)
Potassium: 3.3 mmol/L — ABNORMAL LOW (ref 3.5–5.1)
Sodium: 134 mmol/L — ABNORMAL LOW (ref 135–145)

## 2021-03-04 LAB — LACTIC ACID, PLASMA
Lactic Acid, Venous: 1.4 mmol/L (ref 0.5–1.9)
Lactic Acid, Venous: 0.8 mmol/L (ref 0.5–1.9)

## 2021-03-04 MED ORDER — LEVOFLOXACIN IN D5W 250 MG/50ML IV SOLN
250.0000 mg | INTRAVENOUS | Status: DC
  Administered 2021-03-04: 250 mg via INTRAVENOUS
  Filled 2021-03-04: qty 50

## 2021-03-04 NOTE — Assessment & Plan Note (Signed)
Creatinine 1.88 on admission up from baseline of 0.55.  Continue IV hydration and monitor.  Likely prerenal from poor oral intake

## 2021-03-04 NOTE — Assessment & Plan Note (Signed)
Likely secondary to alcohol abuse.  Monitor lactic acid

## 2021-03-04 NOTE — Assessment & Plan Note (Signed)
Monitor for alcohol withdrawal symptoms - on CIWA. 

## 2021-03-04 NOTE — Assessment & Plan Note (Signed)
Continue metoprolol for rate control and Eliquis for anticoagulation

## 2021-03-04 NOTE — ED Notes (Signed)
Angela RN aware of assigned bed 

## 2021-03-04 NOTE — Progress Notes (Signed)
  Progress Note    James Sanford   QQI:[id number]  DOB: 1956-03-02  DOA: 03/03/2021     1 Date of Service: 03/04/2021   Clinical Course  65 y.o. male with medical history significant for Severe alcohol use disorder, DM, HTN, sleep apnea, paroxysmal A. fib on anticoagulation, depression, history of falls with injury while intoxicated, and history of GI bleed, who presents to the ED with 1 day history of generalized weakness to where he had difficulty getting off the commode, with EMS found him at home.  He is admitted for herpes zoster ophthalmicus of the right eye  11/30: Improving on IV acyclovir   Assessment and Plan * Herpes zoster ophthalmicus, right eye Continue IV acyclovir.  Outpatient ophthalmology follow-up with Dr. Brooke Dare in 1 to 2 days after discharge per him  AKI (acute kidney injury) (HCC) Creatinine 1.88 on admission up from baseline of 0.55.  Continue IV hydration and monitor.  Likely prerenal from poor oral intake  Lactic acidosis Likely secondary to alcohol abuse.  Monitor lactic acid  Alcohol use disorder, moderate, dependence (HCC) Monitor for alcohol withdrawal symptoms - on CIWA  Acute lower UTI Based on UA, obtain urine culture.  Start IV Levaquin for now he is penicillin allergic  Essential hypertension On metoprolol  AF (paroxysmal atrial fibrillation) (HCC) Continue metoprolol for rate control and Eliquis for anticoagulation  Diabetes mellitus type 2, uncomplicated (HCC) Sliding scale for now.  Hemoglobin A1c of 5.3    Subjective:  Feeling little better but still has significant erythema or right side of face  Objective Vitals:   03/04/21 0822 03/04/21 1000 03/04/21 1100 03/04/21 1442  BP:  (!) 133/92  (!) 137/93  Pulse:    82  Resp:   16 17  Temp: 98.7 F (37.1 C)   98.4 F (36.9 C)  TempSrc: Oral     SpO2:    97%  Weight:      Height:       81.2 kg  Vital signs were reviewed and unremarkable.   Exam Physical Exam    Mouth/Throat:Mucous membranes are moist.  Neck:Supple with no signs of meningismus. Cardiovascular:Regular rate and rhythm. No murmurs, gallops, or rubs. 2+ symmetrical distal pulses are present . No JVD. No  LE edema Respiratory:Respiratory effort normal .Lungs sounds clear bilaterally. No wheezes, crackles, or rhonchi.  Gastrointestinal:Soft, non tender, non distended. Positive bowel sounds.  Genitourinary:No CVA tenderness. Musculoskeletal:Nontender with normal range of motion in all extremities. No cyanosis, or erythema of extremities. Neurologic:  Face is symmetric. Moving all extremities. No gross focal neurologic deficits . Skin:Skin is warm, dry.  Vesicular and crusting lesions of right face, see pic      psychiatric: Mood and affect are appropriate   Labs / Other Information My review of labs, imaging, notes and other tests is significant for Elevated creatinine     Disposition Plan: Status is: Inpatient  Remains inpatient appropriate because: Getting treated for herpes and AKI along with UTI    Possible discharge in next 1 to 2 days depending on clinical condition with outpatient ophthalmology follow-up    Time spent: 35 minutes [facility] 03/04/2021, 2:55 PM

## 2021-03-04 NOTE — Assessment & Plan Note (Signed)
On metoprolol

## 2021-03-04 NOTE — Assessment & Plan Note (Signed)
Continue IV acyclovir.  Outpatient ophthalmology follow-up with Dr. Brooke Dare in 1 to 2 days after discharge per him

## 2021-03-04 NOTE — Assessment & Plan Note (Signed)
Based on UA, obtain urine culture.  Start IV Levaquin for now he is penicillin allergic

## 2021-03-04 NOTE — Hospital Course (Addendum)
65 y.o. male with medical history significant for Severe alcohol use disorder, DM, HTN, sleep apnea, paroxysmal A. fib on anticoagulation, depression, history of falls with injury while intoxicated, and history of GI bleed, who presents to the ED with 1 day history of generalized weakness to where he had difficulty getting off the commode, with EMS found him at home.  He is admitted for herpes zoster ophthalmicus of the right eye  11/30: Improving on IV acyclovir 12/1: UTI being treated with Levaquin

## 2021-03-04 NOTE — Assessment & Plan Note (Signed)
Sliding scale for now.  Hemoglobin A1c of 5.3

## 2021-03-04 NOTE — Progress Notes (Signed)
Patient awake using the urinal. Reports being tired but feeling slightly better. Breathing regular and unlabored. No needs at this time. Urine sent.

## 2021-03-05 LAB — CREATININE, SERUM
Creatinine, Ser: 0.65 mg/dL (ref 0.61–1.24)
GFR, Estimated: >60 mL/min (ref >60)

## 2021-03-05 LAB — CBC
HCT: 31 % — ABNORMAL LOW (ref 39.0–52.0)
Hemoglobin: 9.7 g/dL — ABNORMAL LOW (ref 13.0–17.0)
MCH: 28.9 pg (ref 26.0–34.0)
MCHC: 31.3 g/dL (ref 30.0–36.0)
MCV: 92.3 fL (ref 80.0–100.0)
Platelets: 103 10*3/uL — ABNORMAL LOW (ref 150–400)
RBC: 3.36 MIL/uL — ABNORMAL LOW (ref 4.22–5.81)
RDW: 15.1 % (ref 11.5–15.5)
WBC: 6.2 10*3/uL (ref 4.0–10.5)
nRBC: 0 % (ref 0.0–0.2)

## 2021-03-05 MED ORDER — DEXTROSE 5 % IV SOLN
800.0000 mg | Freq: Three times a day (TID) | INTRAVENOUS | Status: DC
  Administered 2021-03-05 – 2021-03-06 (×4): 800 mg via INTRAVENOUS
  Filled 2021-03-05 (×2): qty 16
  Filled 2021-03-05: qty 10
  Filled 2021-03-05 (×3): qty 16
  Filled 2021-03-05: qty 4

## 2021-03-05 MED ORDER — LEVOFLOXACIN IN D5W 500 MG/100ML IV SOLN
500.0000 mg | INTRAVENOUS | Status: DC
  Administered 2021-03-05: 500 mg via INTRAVENOUS
  Filled 2021-03-05 (×2): qty 100

## 2021-03-05 NOTE — Assessment & Plan Note (Signed)
On metoprolol

## 2021-03-05 NOTE — Assessment & Plan Note (Addendum)
Likely secondary to alcohol abuse. lactic acid improving

## 2021-03-05 NOTE — Assessment & Plan Note (Addendum)
Sliding scale for now.  Hemoglobin A1c of 5.3 

## 2021-03-05 NOTE — Consult Note (Signed)
Pharmacy Antibiotic Note  James Sanford is a 65 y.o. male admitted on 03/03/2021 with rt-sided periorbital erythema and swelling extending to forearm w/ scatter vessicles along nose concerning for  herpes zoster (shingles) .  Pharmacy has been consulted for Acyclovir dosing.  Scr has improved: 1.88>>0.78>>0.65   Plan: Will adjust dose to  Acyclovir 10mg /kg (ActBW of 81.2kg) --> 800mg  q8 h per current indication and improved renal function. Conc <7mg /mL and infused over 1hr Continue to infuse with MIVF NS @125ml /h to prevent renal toxicity.  CTM for: Signs of neurotoxicity (confusion, agitation, or hallucination) typically within the first 3-5 days of therapy Signs of nephrotoxicity within 48 hours of administration (given with MIVF to reduce risk) and eliminate other renal toxic agents. Signs of phlebitis. (Infusion rate and concentration appropriate)  Height: 6' (182.9 cm) Weight: 81.2 kg (179 lb) IBW/kg (Calculated) : 77.6  Temp (24hrs), Avg:98.6 F (37 C), Min:98.4 F (36.9 C), Max:98.8 F (37.1 C)  Recent Labs  Lab 03/03/21 1616 03/03/21 2111 03/04/21 1423 03/04/21 1510 03/04/21 1749 03/05/21 0343  WBC 6.9  --   --   --   --  6.2  CREATININE 1.88*  --  0.78  --   --  0.65  LATICACIDVEN 2.4* 2.5*  --  1.4 0.8  --      Estimated Creatinine Clearance: 103.7 mL/min (by C-G formula based on SCr of 0.65 mg/dL).    Allergies  Allergen Reactions   Penicillins Diarrhea    TOLERATED CEFAZOLIN 11/25/20 Unknown childhood reaction    Antimicrobials this admission: Acyclovir (11/29 >>    Microbiology results: 11/29 Flu/Cov: pending 12/1 Ucx: pending   Thank you for allowing pharmacy to be a part of this patient's care.  06-25-1974, PharmD, BCPS Clinical Pharmacist 03/05/2021 9:00 AM

## 2021-03-05 NOTE — Progress Notes (Signed)
  Progress Note    James Sanford   QMG:[id number]  DOB: February 18, 1956  DOA: 03/03/2021     2 Date of Service: 03/05/2021   Clinical Course  65 y.o. male with medical history significant for Severe alcohol use disorder, DM, HTN, sleep apnea, paroxysmal A. fib on anticoagulation, depression, history of falls with injury while intoxicated, and history of GI bleed, who presents to the ED with 1 day history of generalized weakness to where he had difficulty getting off the commode, with EMS found him at home.  He is admitted for herpes zoster ophthalmicus of the right eye  11/30: Improving on IV acyclovir 12/1: UTI being treated with Levaquin   Assessment and Plan * Herpes zoster ophthalmicus, right eye Improving on IV acyclovir.  Outpatient ophthalmology follow-up with Dr. Brooke Dare in 1 to 2 days after discharge per him  AKI (acute kidney injury) (HCC) Creatinine 1.88 on admission up from baseline of 0.55.  Resolved with IV hydration.  Likely prerenal from poor oral intake  Lactic acidosis Likely secondary to alcohol abuse. lactic acid improving  Alcohol use disorder, moderate, dependence (HCC) Monitor for alcohol withdrawal symptoms - on CIWA.  Acute lower UTI Based on UA, obtain urine culture.  Continue IV Levaquin for now he is penicillin allergic  Essential hypertension On metoprolol.  AF (paroxysmal atrial fibrillation) (HCC) Continue metoprolol for rate control and Eliquis for anticoagulation.  Diabetes mellitus type 2, uncomplicated (HCC) Sliding scale for now.  Hemoglobin A1c of 5.3.     Subjective:  Feeling better.  No new complaints  Objective Vitals:   03/04/21 1921 03/05/21 0013 03/05/21 0534 03/05/21 0814  BP: (!) 146/103 (!) 153/110 (!) 140/93 (!) 143/96  Pulse: 88 100 87 84  Resp: 16 18 17 20   Temp: 98.8 F (37.1 C)   98.6 F (37 C)  TempSrc:      SpO2: 97% 96% 98% 99%  Weight:      Height:       81.2 kg  Vital signs were reviewed and  unremarkable.   Exam Physical Exam   Mouth/Throat:Mucous membranes are moist.  Neck:Supple with no signs of meningismus. Cardiovascular:Regular rate and rhythm. No murmurs, gallops, or rubs. 2+ symmetrical distal pulses are present . No JVD. No LE edema Respiratory:Respiratory effort normal .Lungs sounds clear bilaterally. No wheezes, crackles, or rhonchi.  Gastrointestinal:Soft, non tender, non distended. Positive bowel sounds.  Genitourinary:No CVA tenderness. Musculoskeletal:Nontender with normal range of motion in all extremities. No cyanosis, or erythema of extremities. Neurologic: Face is symmetric. Moving all extremities. No gross focal neurologic deficits . Skin:Skin is warm, dry.Vesicular and crusting lesions of right face, seepic      Labs / Other Information My review of labs, imaging, notes and other tests shows no new significant findings.    Disposition Plan: Status is: Inpatient  Remains inpatient appropriate because: 1 more day of IV acyclovir and antibiotic with plan for discharge tomorrow and outpatient ophthalmology follow-up early next week        Time spent: 35 minutes [facility] 03/05/2021, 11:53 AM

## 2021-03-05 NOTE — Assessment & Plan Note (Signed)
Based on UA, obtain urine culture.  Continue IV Levaquin for now he is penicillin allergic

## 2021-03-05 NOTE — Assessment & Plan Note (Signed)
Improving on IV acyclovir.  Outpatient ophthalmology follow-up with Dr. Brooke Dare in 1 to 2 days after discharge per him

## 2021-03-05 NOTE — Assessment & Plan Note (Signed)
Continue metoprolol for rate control and Eliquis for anticoagulation 

## 2021-03-05 NOTE — Assessment & Plan Note (Signed)
Monitor for alcohol withdrawal symptoms - on CIWA.

## 2021-03-05 NOTE — Assessment & Plan Note (Signed)
Creatinine 1.88 on admission up from baseline of 0.55.  Resolved with IV hydration.  Likely prerenal from poor oral intake

## 2021-03-05 NOTE — Progress Notes (Signed)
PHARMACY NOTE:  ANTIMICROBIAL RENAL DOSAGE ADJUSTMENT  Current antimicrobial regimen includes a mismatch between antimicrobial dosage and estimated renal function.  As per policy approved by the Pharmacy & Therapeutics and Medical Executive Committees, the antimicrobial dosage will be adjusted accordingly.  Current antimicrobial dosage: Levofloxacin 250mg  IV every 24 hours   Indication: UTI  Renal Function: Estimated Creatinine Clearance: 103.7 mL/min (by C-G formula based on SCr of 0.65 mg/dL).  Antimicrobial dosage has been changed to:  Levofloxacin 500mg  evry 24 hours   Additional comments: Renal function has improved.    Thank you for allowing pharmacy to be a part of this patient's care.  , PharmD, BCPS Clinical Pharmacist 03/05/2021 8:50 AM

## 2021-03-06 DIAGNOSIS — E860 Dehydration: Secondary | ICD-10-CM

## 2021-03-06 MED ORDER — LEVOFLOXACIN 500 MG PO TABS: 500.0000 mg | ORAL_TABLET | Freq: Every day | ORAL | 0 refills | Status: AC

## 2021-03-06 MED ORDER — ACYCLOVIR 800 MG PO TABS: 400.0000 mg | ORAL_TABLET | Freq: Two times a day (BID) | ORAL | 0 refills | Status: DC

## 2021-03-06 MED ORDER — VALACYCLOVIR HCL 1 G PO TABS: 1000.0000 mg | ORAL_TABLET | Freq: Three times a day (TID) | ORAL | 0 refills | Status: AC

## 2021-03-06 NOTE — Progress Notes (Signed)
OT Cancellation Note  Patient Details Name: James Sanford MRN: [id number] DOB: 09-Apr-1955   Cancelled Treatment:    Reason Eval/Treat Not Completed: Patient declined, no reason specified. Pt declined OT evaluation this admission. He reports having OT on last admission and does not feel he needs further assessment/education at this time.   Jackquline Denmark, MS, OTR/L , CBIS ascom [phone]  03/06/21, 12:42 PM

## 2021-03-06 NOTE — Plan of Care (Signed)
  Problem: Clinical Measurements: Goal: Ability to avoid or minimize complications of infection will improve Outcome: Progressing   Problem: Skin Integrity: Goal: Skin integrity will improve Outcome: Progressing   Problem: Health Behavior/Discharge Planning: Goal: Ability to manage health-related needs will improve Outcome: Progressing   Problem: Clinical Measurements: Goal: Ability to maintain clinical measurements within normal limits will improve Outcome: Progressing Goal: Will remain free from infection Outcome: Progressing Goal: Respiratory complications will improve Outcome: Progressing   Problem: Activity: Goal: Risk for activity intolerance will decrease Outcome: Progressing   Problem: Nutrition: Goal: Adequate nutrition will be maintained Outcome: Progressing   Problem: Coping: Goal: Level of anxiety will decrease Outcome: Progressing   Problem: Elimination: Goal: Will not experience complications related to bowel motility Outcome: Progressing Goal: Will not experience complications related to urinary retention Outcome: Progressing   Problem: Pain Managment: Goal: General experience of comfort will improve Outcome: Progressing   Problem: Safety: Goal: Ability to remain free from injury will improve Outcome: Progressing   Problem: Skin Integrity: Goal: Risk for impaired skin integrity will decrease Outcome: Progressing   

## 2021-03-06 NOTE — TOC Initial Note (Signed)
Transition of Care [facility]) - Initial/Assessment Note    Patient Details  Name: James Sanford MRN: [id number] Date of Birth: [birth year]  [facility] CM/SW Contact:    Pete Pelt, RN Phone Number: 03/06/2021, 1:06 PM  Clinical Narrative:     Patient lives at home with wife, has been attending outpatient PT at [facility].  Patient has DME at home from last admission.  Spoke with patient's wife about discharge, she states he is not able to walk.  Explained that he has been evaluated by PT and is cleared to return home with outpatient Physical Therapy.  Patient's wife is amenable to this plan.  Wife states she is in need of personal care agencies to assist her during the day to care for patient.  She would also like substance abuse resources provided.  She stated that patient is experiencing ETOH use and abuse and refuses to go to rehabilitation.  Explained to spouse that patient is not obligated to attend as he is alert and oriented and is deemed to make his own decisions.  Resources for substance use/abuse and personal care agencies provided with discharge to assist patient and spouse with care.   Patient has an eye appointment today to treat condition, wife states she is able to take patient to that appointment.            Expected Discharge Plan: Home/Self Care (Outpatient PT)     Patient Goals and CMS Choice        Expected Discharge Plan and Services Expected Discharge Plan: Home/Self Care (Outpatient PT)   Discharge Planning Services: CM Consult     Expected Discharge Date: 03/06/21                                    Prior Living Arrangements/Services   Lives with:: Self, Spouse Patient language and need for interpreter reviewed:: Yes Do you feel safe going back to the place where you live?: Yes               Activities of Daily Living Home Assistive Devices/Equipment: Cane (specify quad or straight), Walker (specify type) ADL  Screening (condition at time of admission) Patient's cognitive ability adequate to safely complete daily activities?: Yes Is the patient deaf or have difficulty hearing?: No Does the patient have difficulty seeing, even when wearing glasses/contacts?: No Does the patient have difficulty concentrating, remembering, or making decisions?: No Patient able to express need for assistance with ADLs?: Yes Does the patient have difficulty dressing or bathing?: No Independently performs ADLs?: Yes (appropriate for developmental age) Does the patient have difficulty walking or climbing stairs?: Yes Weakness of Legs: Both Weakness of Arms/Hands: None  Permission Sought/Granted Permission sought to share information with : Case Manager Permission granted to share information with : Yes, Verbal Permission Granted     Permission granted to share info w AGENCY: RNCM,        Emotional Assessment         Alcohol / Substance Use: Alcohol Use Psych Involvement: No (comment)  Admission diagnosis:  Dehydration [E86.0] AKI (acute kidney injury) ([place]) [N17.9] Weakness of both lower extremities [R29.898] Herpes zoster ophthalmicus of right eye [B02.30] Herpes zoster ophthalmicus, right eye [B02.30] Patient Active Problem List   Diagnosis Date Noted   Dehydration    AKI (acute kidney injury) ([place]) 03/04/2021   Herpes zoster ophthalmicus of right eye 03/03/2021  Alcohol use disorder, moderate, dependence (HCC) 03/03/2021   Lactic acidosis 03/03/2021   Iron deficiency anemia    Hypokalemia    Diarrhea    Impaired fasting glucose    Malnutrition of moderate degree 12/02/2020   Protein-calorie malnutrition, severe 11/25/2020   Moderate major depression, single episode (HCC) 11/24/2020   Closed nondisplaced intertrochanteric fracture of left femur (HCC) 11/24/2020   Acute lower UTI 11/24/2020   Hyperlipemia    Acute GI bleeding 01/25/2020   Diabetes mellitus type 2, uncomplicated (HCC)  01/25/2020   Hyperlipidemia 01/25/2020   Obesity 01/25/2020   Essential hypertension 01/25/2020   Sleep apnea 01/25/2020   Alcohol abuse 03/07/2019   Cardiomyopathy, idiopathic (HCC) 03/07/2019   AF (paroxysmal atrial fibrillation) (HCC) 11/07/2017   Radius fracture 10/29/2017   PCP:  Jerl Mina, MD Pharmacy:   [id number] [place], [place] - [address] AT [facility] OF [address] [place] [address] Phone: [phone] Fax: [phone]     Social Determinants of Health (SDOH) Interventions    Readmission Risk Interventions No flowsheet data found.

## 2021-03-06 NOTE — Evaluation (Signed)
Physical Therapy Evaluation Patient Details Name: James Sanford MRN: [id number] DOB: [birth year] Today's Date: 03/06/2021  History of Present Illness  presented to ER secondary to generalized weakness, progressive pain in R eye/face; admitted for management of herpes zoster opthamalicus, R eye.  Clinical Impression  Patient resting in bed upon arrival to room; alert and oriented, but generally flat affect, requiring min encouragement for participation with session.  Endorses continued discomfort in R face/eye, but does note improvement since admission.  Bilat UE/LE strength and ROM grossly WFL for basic transfers and mobility; no significant focal weakness appreciated.  Able to complete bed mobility with mod indep; sit/stand, basic transfers and gait (50') with RW, cga/close sup.  Demonstrates mild forward trunk flexion; reciprocal stepping pattern with fair step height/length, limited hip/knee flexion L LE with gait, but no overt buckling or LOB. Negotiates turns, stationary obstacles without safety concern; voices comfort/confidence in performance and feels it near recent baseline. Would benefit from skilled PT to address above deficits and promote optimal return to PLOF.; recommend transition to home with resumption of outpatient PT services upon discharge.       Recommendations for follow up therapy are one component of a multi-disciplinary discharge planning process, led by the attending physician.  Recommendations may be updated based on patient status, additional functional criteria and insurance authorization.  Follow Up Recommendations Outpatient PT (patient prefers resumption of outpatient PT)    Assistance Recommended at Discharge PRN  Functional Status Assessment Patient has had a recent decline in their functional status and demonstrates the ability to make significant improvements in function in a reasonable and predictable amount of time.  Equipment Recommendations   (has  equipment needed)    Recommendations for Other Services       Precautions / Restrictions Precautions Precautions: Fall Restrictions Weight Bearing Restrictions: No      Mobility  Bed Mobility Overal bed mobility: Modified Independent                  Transfers Overall transfer level: Needs assistance Equipment used: Rolling walker (2 wheels) Transfers: Sit to/from Stand Sit to Stand: Min guard;Supervision           General transfer comment: increased time, and does require UE support to safely complete; but completes transition without physical assist from therapist    Ambulation/Gait Ambulation/Gait assistance: Min guard;Supervision Gait Distance (Feet): 50 Feet Assistive device: Rolling walker (2 wheels)         General Gait Details: mild forward trunk flexion; reciprocal stepping pattern with fair step height/length, limited hip/knee flexion L LE with gait, but no overt buckling or LOB. Negotiates turns, stationary obstacles without safety concern; voices comfort/confidence in performance and feels it near recent baseline.  Stairs            Wheelchair Mobility    Modified Rankin (Stroke Patients Only)       Balance Overall balance assessment: Needs assistance Sitting-balance support: No upper extremity supported;Feet supported Sitting balance-Leahy Scale: Good     Standing balance support: Bilateral upper extremity supported Standing balance-Leahy Scale: Fair                               Pertinent Vitals/Pain Pain Assessment: Faces Faces Pain Scale: Hurts little more Pain Location: R eye/face Pain Descriptors / Indicators: Aching;Grimacing;Guarding Pain Intervention(s): Limited activity within patient's tolerance;Monitored during session;Repositioned    Home Living Family/patient expects to be discharged to:: Private  residence Living Arrangements: Spouse/significant other Available Help at Discharge: Family Type of  Home: House Home Access: Ramped entrance     Alternate Level Stairs-Number of Steps: full flight, has a stair lift (not required to physically negotiate steps) Home Layout: Two level;1/2 bath on main level Home Equipment: Rolling Walker (2 wheels);BSC/3in1;Wheelchair - manual      Prior Function Prior Level of Function : Independent/Modified Independent             Mobility Comments: Recent use of RW since L hip fracture (Aug, 2022); actively participating with outpatient PT for continued rehab       Hand Dominance        Extremity/Trunk Assessment   Upper Extremity Assessment Upper Extremity Assessment: Overall WFL for tasks assessed    Lower Extremity Assessment Lower Extremity Assessment: Overall WFL for tasks assessed (grossly at least 4- to 4/5 throughout)       Communication   Communication: No difficulties  Cognition Arousal/Alertness: Awake/alert Behavior During Therapy: WFL for tasks assessed/performed;Flat affect Overall Cognitive Status: Within Functional Limits for tasks assessed                                 General Comments: limited interest, engagement with session; focused on desire to go home        General Comments      Exercises     Assessment/Plan    PT Assessment Patient needs continued PT services  PT Problem List Decreased strength;Decreased activity tolerance;Decreased balance;Decreased range of motion;Decreased mobility       PT Treatment Interventions DME instruction;Gait training;Stair training;Functional mobility training;Therapeutic activities;Therapeutic exercise;Balance training;Patient/family education    PT Goals (Current goals can be found in the Care Plan section)  Acute Rehab PT Goals Patient Stated Goal: to go home PT Goal Formulation: With patient Time For Goal Achievement: 03/20/21 Potential to Achieve Goals: Good    Frequency Min 2X/week   Barriers to discharge        Co-evaluation                AM-PAC PT "6 Clicks" Mobility  Outcome Measure Help needed turning from your back to your side while in a flat bed without using bedrails?: None Help needed moving from lying on your back to sitting on the side of a flat bed without using bedrails?: None Help needed moving to and from a bed to a chair (including a wheelchair)?: None Help needed standing up from a chair using your arms (e.g., wheelchair or bedside chair)?: A Little Help needed to walk in hospital room?: A Little Help needed climbing 3-5 steps with a railing? : A Little 6 Click Score: 21    End of Session Equipment Utilized During Treatment: Gait belt Activity Tolerance: Patient tolerated treatment well Patient left: with call bell/phone within reach;in bed;with bed alarm set Nurse Communication: Mobility status PT Visit Diagnosis: Muscle weakness (generalized) (M62.81);Difficulty in walking, not elsewhere classified (R26.2)    Time: 9798-9211 PT Time Calculation (min) (ACUTE ONLY): 17 min   Charges:   PT Evaluation $PT Eval Moderate Complexity: 1 Mod          Natividad Halls H. Manson Passey, PT, DPT, NCS 03/06/21, 1:52 PM [phone]

## 2021-03-06 NOTE — Plan of Care (Signed)
Patient discharged home per MD orders at this time.All discharge instructions,education and medications reviewed with the patient and spouse at the bedside.patient expressed understanding and will comply with dc instructions.follow up appointments was also communicated to the patient.no verbal c/o or any ssx of distress.patient was discharged home with outpatient PT services per order.patient was transported home by spouse in a privately owned vehicle.

## 2021-03-07 LAB — URINE CULTURE: Culture: >=100000 — AB

## 2021-03-07 NOTE — Discharge Summary (Signed)
Physician Discharge Summary   Patient name: James Sanford  Admit date:     03/03/2021  Discharge date: 03/06/2021  Discharge Physician: Delfino Lovett   PCP: Jerl Mina, MD   Recommendations at discharge: f/up with outpt providers as requested  Discharge Diagnoses Principal Problem:   Herpes zoster ophthalmicus of right eye Active Problems:   Diabetes mellitus type 2, uncomplicated (HCC)   AF (paroxysmal atrial fibrillation) (HCC)   Essential hypertension   Acute lower UTI   Alcohol use disorder, moderate, dependence (HCC)   Lactic acidosis   AKI (acute kidney injury) (HCC)   Dehydration  Hospital Course   65 y.o. male with medical history significant for Severe alcohol use disorder, DM, HTN, sleep apnea, paroxysmal A. fib on anticoagulation, depression, history of falls with injury while intoxicated, and history of GI bleed, who presents to the ED with 1 day history of generalized weakness to where he had difficulty getting off the commode, with EMS found him at home.  He is admitted for herpes zoster ophthalmicus of the right eye  11/30: Improving on IV acyclovir 12/1: UTI being treated with Levaquin  Assessment and Plan * Herpes zoster ophthalmicus, right eye Improving on IV acyclovir.  Outpatient ophthalmology follow-up with Dr. Brooke Dare at D/C on 12/2 afternoon as scheduled. Being D/C on oral valtrex.   AKI (acute kidney injury) (HCC) Creatinine 1.88 on admission up from baseline of 0.55.  Resolved with IV hydration. prerenal from poor oral intake   Lactic acidosis Likely secondary to alcohol abuse. lactic acid improved.   Alcohol use disorder, moderate, dependence (HCC) No withdrawal while in the hospital   Acute lower UTI  Urine c/s growing e.coli, treated with Levaquin.   Essential hypertension On metoprolol.   AF (paroxysmal atrial fibrillation) (HCC) Continue metoprolol for rate control and Eliquis for anticoagulation.   Diabetes mellitus type 2,  uncomplicated (HCC) Sliding scale while in the Hospital  Hemoglobin A1c of 5.3.   Condition at discharge: good  Exam Physical Exam   Mouth/Throat: Mucous membranes are moist.       Neck: Supple with no signs of meningismus. Cardiovascular: Regular rate and rhythm. No murmurs, gallops, or rubs. 2+ symmetrical distal pulses are present . No JVD. No  LE edema Respiratory: Respiratory effort normal .Lungs sounds clear bilaterally. No wheezes, crackles, or rhonchi.  Gastrointestinal: Soft, non tender, non distended. Positive bowel sounds.  Genitourinary: No CVA tenderness. Musculoskeletal: Nontender with normal range of motion in all extremities. No cyanosis, or erythema of extremities. Neurologic:  Face is symmetric. Moving all extremities. No gross focal neurologic deficits . Skin: Skin is warm, dry.  Vesicular and crusting lesions of right face, see pic (take on admission). This has improved - all redness gone at D/C     Disposition: Home with Home health  Discharge time: greater than 30 minutes.  Follow-up Information     Nevada Crane, MD. Go on 03/06/2021.   Specialty: Ophthalmology Why: [facility] Discharge F/UP for Shingles of R Face and Eye; NOTE: Appt w/ Dr.Porsilio  Appt @ 3:20 pm Contact information: [address] [place] [address] [phone]         Jerl Mina, MD. Schedule an appointment as soon as possible for a visit in 1 week(s).   Specialty: Family Medicine Why: [facility] Discharge F/UP;  Office will call Back w/ Appt Contact information: [address] [facility] [place] [address] [phone]  Allergies as of 03/06/2021       Reactions   Penicillins Diarrhea   TOLERATED CEFAZOLIN 11/25/20 Unknown childhood reaction        Medication List     STOP taking these medications    HYDROcodone-acetaminophen 5-325 MG tablet Commonly known as: NORCO/VICODIN       TAKE these medications     Creon 24000-76000 units Cpep Generic drug: Pancrelipase (Lip-Prot-Amyl) Take 2 capsules by mouth 3 (three) times daily.   Eliquis 5 MG Tabs tablet Generic drug: apixaban Take 5 mg by mouth 2 (two) times daily.   escitalopram 10 MG tablet Commonly known as: LEXAPRO Take 1 tablet by mouth daily.   ferrous sulfate 325 (65 FE) MG tablet Take 1 tablet (325 mg total) by mouth every other day.   FLUoxetine 20 MG capsule Commonly known as: PROZAC Take 1 capsule (20 mg total) by mouth daily.   levofloxacin 500 MG tablet Commonly known as: LEVAQUIN Take 1 tablet (500 mg total) by mouth daily for 2 days.   metoprolol succinate 50 MG 24 hr tablet Commonly known as: TOPROL-XL Take 0.5 tablets (25 mg total) by mouth daily.   Probiotic 250 MG Caps Take 1 capsule by mouth in the morning and at bedtime.   valACYclovir 1000 MG tablet Commonly known as: Valtrex Take 1 tablet (1,000 mg total) by mouth 3 (three) times daily for 7 days.        CT Head Wo Contrast  Result Date: 03/03/2021 CLINICAL DATA:  Neurological deficit, acute, stroke suspected. EXAM: CT HEAD WITHOUT CONTRAST TECHNIQUE: Contiguous axial images were obtained from the base of the skull through the vertex without intravenous contrast. COMPARISON:  Orbital CT earlier same day. FINDINGS: Brain: Generalized brain atrophy. Chronic small-vessel ischemic changes of the cerebral hemispheric white matter. No sign of acute infarction, mass lesion, hemorrhage, hydrocephalus or extra-axial collection. Vascular: There is atherosclerotic calcification of the major vessels at the base of the brain. Skull: Negative Sinuses/Orbits: Sinuses are clear. No postseptal orbital abnormality. Preseptal edema on the right. Other: None IMPRESSION: No acute intracranial finding. Brain atrophy. Chronic small-vessel ischemic changes of the white matter. Preseptal soft tissue swelling on the right. Electronically Signed   By: Paulina Fusi M.D.   On:  03/03/2021 19:21   CT Orbits W Contrast  Result Date: 03/03/2021 CLINICAL DATA:  Orbital cellulitis. Right-sided facial and periorbital redness. EXAM: CT ORBITS WITH CONTRAST TECHNIQUE: Multidetector CT images was performed according to the standard protocol following intravenous contrast administration. CONTRAST:  77mL OMNIPAQUE IOHEXOL 300 MG/ML  SOLN COMPARISON:  Head CT 11/23/2020 FINDINGS: Orbits: Mild right periorbital/preseptal soft tissue thickening and enhancement. No postseptal inflammation, fluid collection, or mass. Intact globes. Visible paranasal sinuses: Minimal bilateral ethmoid air cell mucosal thickening. Clear mastoid air cells. Soft tissues: Right periorbital inflammation extends superiorly into the right forehead and inferiorly into the right cheek, incompletely imaged. Osseous: No fracture or destructive osseous process. Limited intracranial: No acute finding. IMPRESSION: Right-sided facial and periorbital soft tissue inflammation compatible with cellulitis. No evidence of postseptal cellulitis or abscess. Electronically Signed   By: Sebastian Ache M.D.   On: 03/03/2021 18:44   Results for orders placed or performed during the hospital encounter of 03/03/21  Resp Panel by RT-PCR (Flu A&B, Covid) Nasopharyngeal Swab     Status: None   Collection Time: 03/03/21  9:11 PM   Specimen: Nasopharyngeal Swab; Nasopharyngeal(NP) swabs in vial transport medium  Result Value Ref Range Status   SARS Coronavirus 2  by RT PCR NEGATIVE NEGATIVE Final    Comment: (NOTE) SARS-CoV-2 target nucleic acids are NOT DETECTED.  The SARS-CoV-2 RNA is generally detectable in upper respiratory specimens during the acute phase of infection. The lowest concentration of SARS-CoV-2 viral copies this assay can detect is 138 copies/mL. A negative result does not preclude SARS-Cov-2 infection and should not be used as the sole basis for treatment or other patient management decisions. A negative result may occur  with  improper specimen collection/handling, submission of specimen other than nasopharyngeal swab, presence of viral mutation(s) within the areas targeted by this assay, and inadequate number of viral copies(<138 copies/mL). A negative result must be combined with clinical observations, patient history, and epidemiological information. The expected result is Negative.  Fact Sheet for Patients:  BloggerCourse.com  Fact Sheet for Healthcare Providers:  SeriousBroker.it  This test is no t yet approved or cleared by the [place] FDA and  has been authorized for detection and/or diagnosis of SARS-CoV-2 by FDA under an Emergency Use Authorization (EUA). This EUA will remain  in effect (meaning this test can be used) for the duration of the COVID-19 declaration under Section 564(b)(1) of the Act, 21 U.S.C.section 360bbb-3(b)(1), unless the authorization is terminated  or revoked sooner.       Influenza A by PCR NEGATIVE NEGATIVE Final   Influenza B by PCR NEGATIVE NEGATIVE Final    Comment: (NOTE) The Xpert Xpress SARS-CoV-2/FLU/RSV plus assay is intended as an aid in the diagnosis of influenza from Nasopharyngeal swab specimens and should not be used as a sole basis for treatment. Nasal washings and aspirates are unacceptable for Xpert Xpress SARS-CoV-2/FLU/RSV testing.  Fact Sheet for Patients: BloggerCourse.com  Fact Sheet for Healthcare Providers: SeriousBroker.it  This test is not yet approved or cleared by the [place] FDA and has been authorized for detection and/or diagnosis of SARS-CoV-2 by FDA under an Emergency Use Authorization (EUA). This EUA will remain in effect (meaning this test can be used) for the duration of the COVID-19 declaration under Section 564(b)(1) of the Act, 21 U.S.C. section 360bbb-3(b)(1), unless the authorization is terminated  or revoked.  Performed at [facility], [address] [place], [place] [address]   Urine Culture     Status: Abnormal   Collection Time: 03/04/21  4:00 AM   Specimen: Urine, Clean Catch  Result Value Ref Range Status   Specimen Description   Final    URINE, CLEAN CATCH Performed at [facility], [address], [place], [place] [address]    Special Requests   Final    NONE Performed at [facility], [address] [place], [place] [address]    Culture >=100,000 COLONIES/mL ESCHERICHIA COLI (A)  Final   Report Status 03/07/2021 FINAL  Final   Organism ID, Bacteria ESCHERICHIA COLI (A)  Final      Susceptibility   Escherichia coli - MIC*    AMPICILLIN >=32 RESISTANT Resistant     CEFAZOLIN 8 SENSITIVE Sensitive     CEFEPIME <=0.12 SENSITIVE Sensitive     CEFTRIAXONE <=0.25 SENSITIVE Sensitive     CIPROFLOXACIN <=0.25 SENSITIVE Sensitive     GENTAMICIN <=1 SENSITIVE Sensitive     IMIPENEM <=0.25 SENSITIVE Sensitive     NITROFURANTOIN 32 SENSITIVE Sensitive     TRIMETH/SULFA <=20 SENSITIVE Sensitive     AMPICILLIN/SULBACTAM >=32 RESISTANT Resistant     PIP/TAZO 8 SENSITIVE Sensitive     * >=100,000 COLONIES/mL ESCHERICHIA COLI    Signed:  Delfino Lovett MD.  [facility] 03/07/2021, 3:21 PM

## 2021-04-17 ENCOUNTER — Other Ambulatory Visit: Payer: Self-pay | Admitting: Neurology

## 2021-04-17 DIAGNOSIS — R413 Other amnesia: Secondary | ICD-10-CM

## 2021-04-17 DIAGNOSIS — R159 Full incontinence of feces: Secondary | ICD-10-CM

## 2021-04-29 ENCOUNTER — Other Ambulatory Visit: Payer: Self-pay

## 2021-04-29 ENCOUNTER — Ambulatory Visit
Admission: RE | Admit: 2021-04-29 | Discharge: 2021-04-29 | Disposition: A | Discharge status: Home / Self Care | Payer: BC Managed Care – PPO | Source: Ambulatory Visit | Attending: Neurology | Admitting: Neurology

## 2021-04-29 DIAGNOSIS — R159 Full incontinence of feces: Secondary | ICD-10-CM | POA: Insufficient documentation

## 2021-04-29 DIAGNOSIS — R413 Other amnesia: Secondary | ICD-10-CM | POA: Diagnosis present

## 2021-04-29 IMAGING — MR MR LUMBAR SPINE W/O CM
5 series · 31 of 48 positions shown · non-contrast
Comparison: None.

CLINICAL DATA: Fecal incontinence

EXAM:
MRI LUMBAR SPINE WITHOUT CONTRAST
TECHNIQUE: Multiplanar, multisequence MR imaging of the lumbar spine was
performed. No intravenous contrast was administered.

[Series 5: T2 · sagittal · 4.0mm · 0.88mm/px · 7 of 17 slices shown (1 of 2)]
[im 1/17]
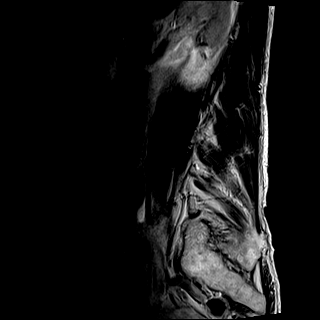
[im 3/17]
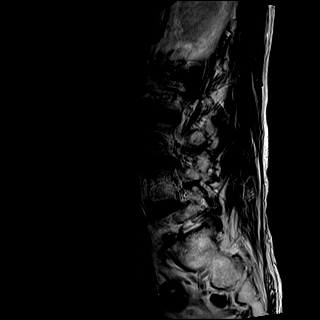
[im 6/17]
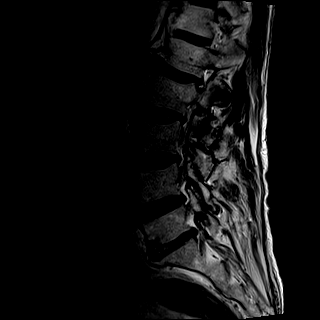
[im 9/17]
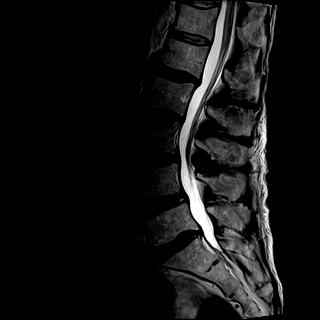
[im 11/17]
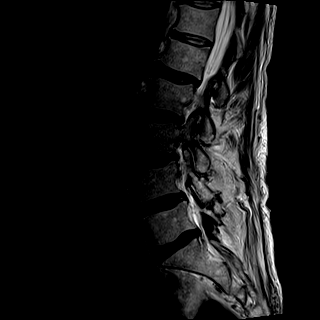
[im 14/17]
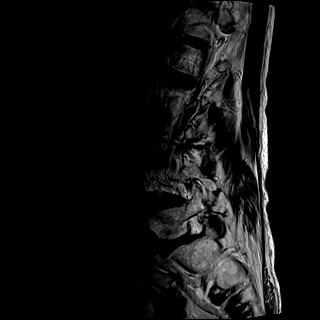
[im 17/17]
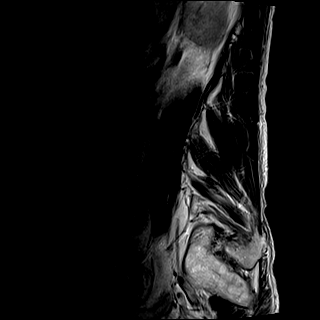

[Series 6: T1 · sagittal · 4.0mm · 0.88mm/px · 7 of 17 slices shown (1 of 2)]
[im 1/17]
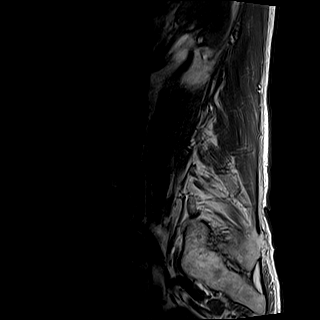
[im 3/17]
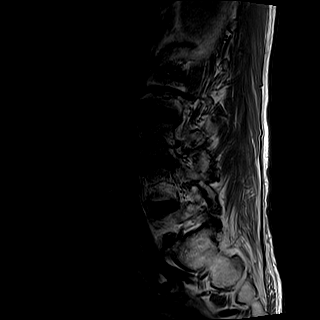
[im 6/17]
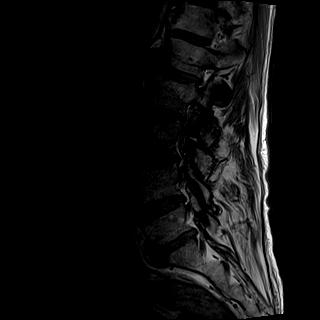
[im 9/17]
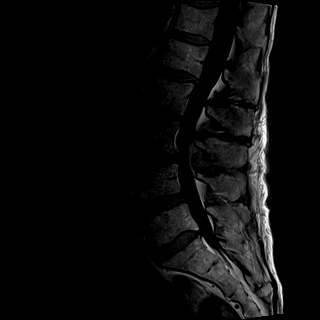
[im 11/17]
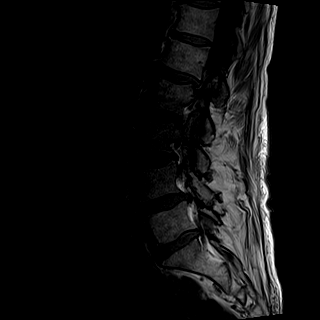
[im 14/17]
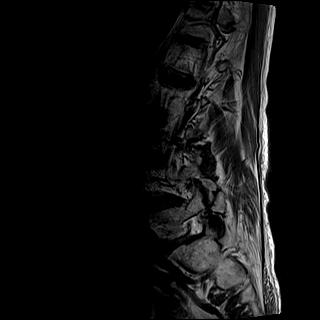
[im 17/17]
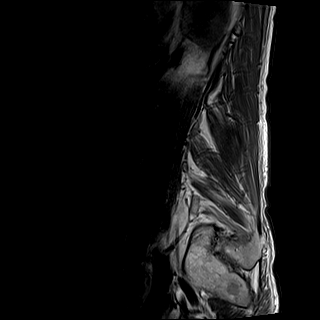

[Series 7: STIR · sagittal · 4.0mm · 0.44mm/px · 1 of 17 slices shown]
[im 1/17]
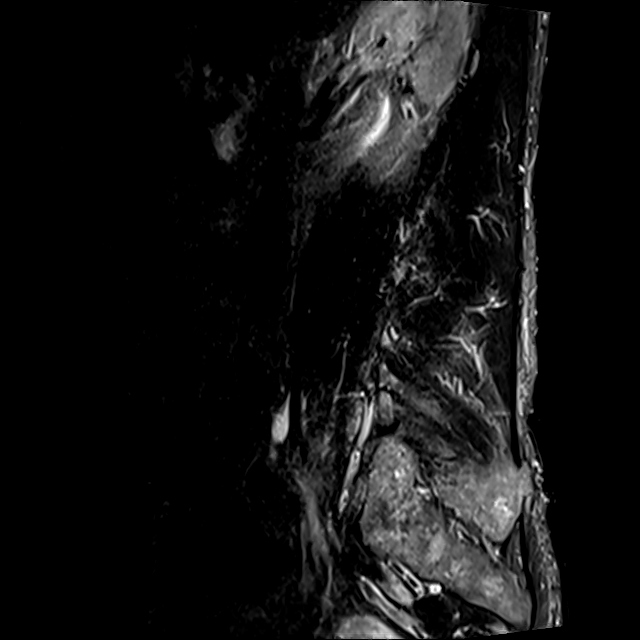

[Series 8: T2 · axial · 4.0mm · 0.78mm/px · z∈[-696,-467]mm · 8 of 38 slices shown (2 of 2)]
[im 1/38]
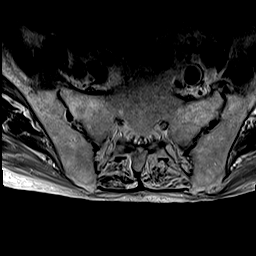
[im 6/38]
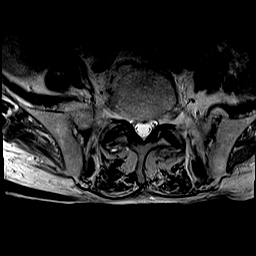
[im 12/38]
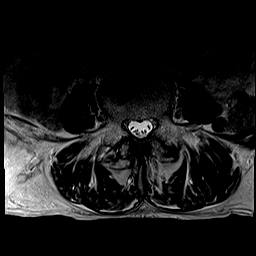
[im 18/38]
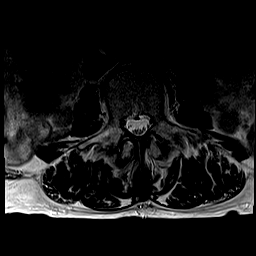
[im 20/38]
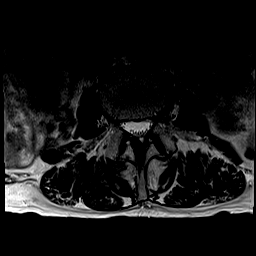
[im 26/38]
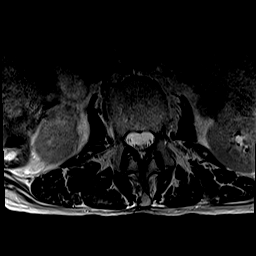
[im 32/38]
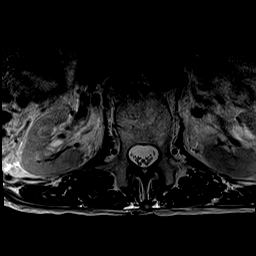
[im 38/38]
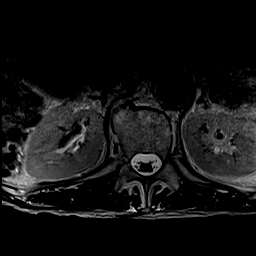

[Series 9: T1 · axial · 4.0mm · 0.39mm/px · z∈[-696,-467]mm · 8 of 38 slices shown (2 of 2)]
[im 1/38]
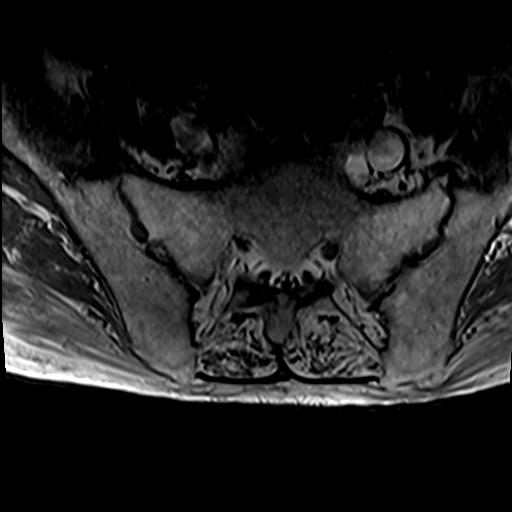
[im 6/38]
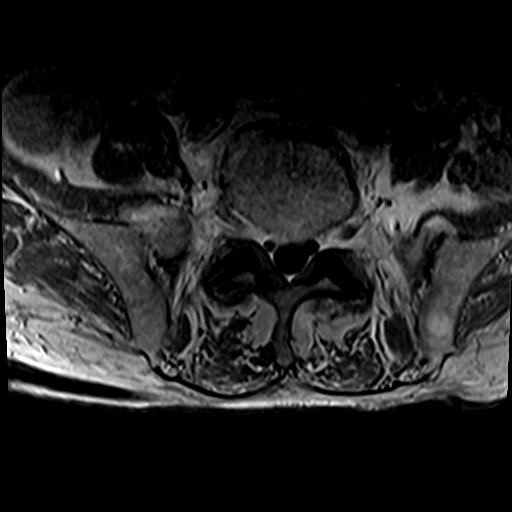
[im 12/38]
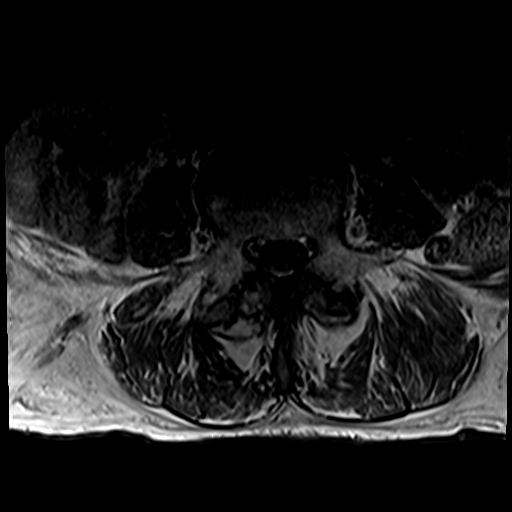
[im 18/38]
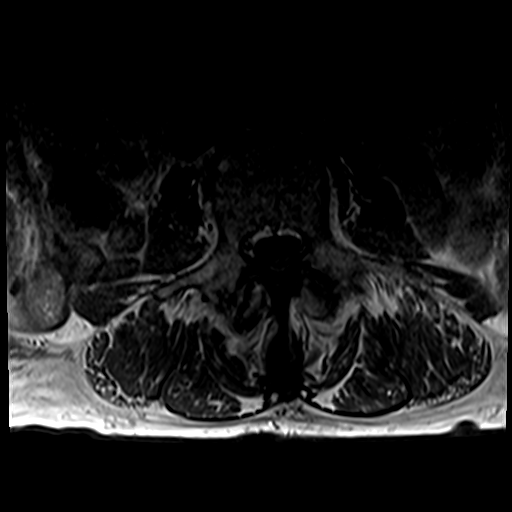
[im 20/38]
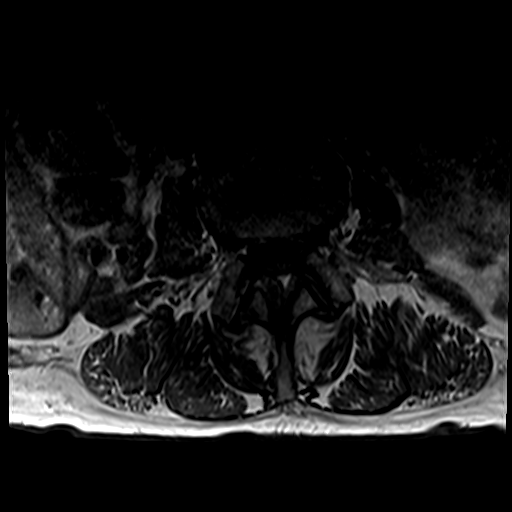
[im 26/38]
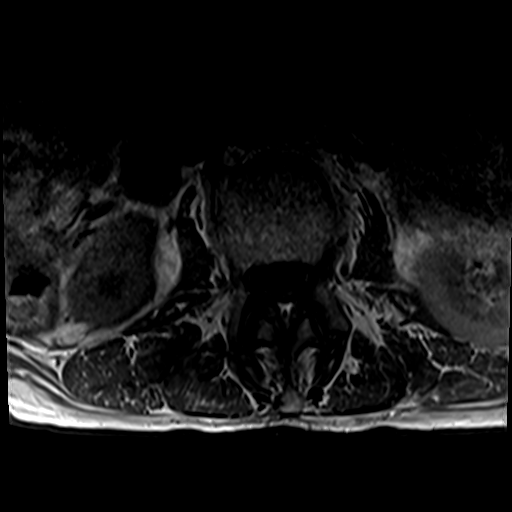
[im 32/38]
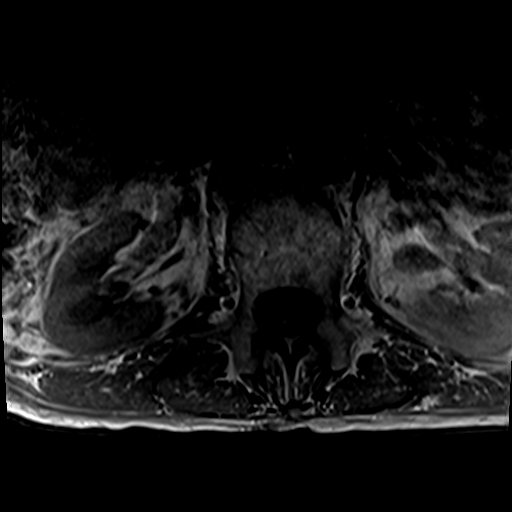
[im 38/38]
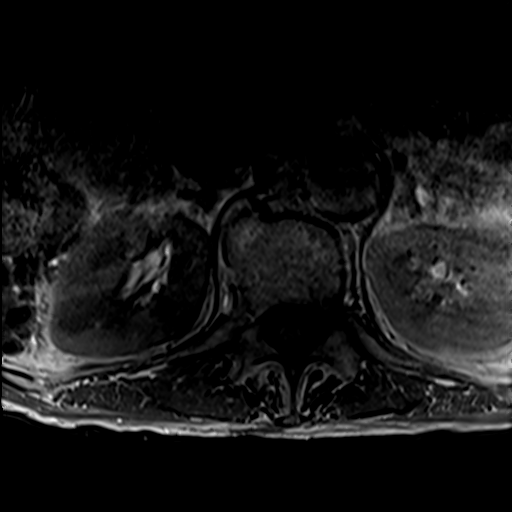

[31 of 48 positions shown; findings below may reference images not displayed]

FINDINGS: Segmentation:  Standard.

Alignment: Mild dextrocurvature of the lumbar spine. Trace
retrolisthesis L1 on L2 and L5 on S1.

Vertebrae: No acute fracture or suspicious osseous lesion.
Congenitally short pedicles, which narrow the AP diameter of the
spinal canal.

Conus medullaris and cauda equina: Conus extends to the L1 level.
Conus and cauda equina appear normal.

Paraspinal and other soft tissues: Negative.

Disc levels:

T12-L1: No significant disc bulge. No spinal canal stenosis or
neural foraminal narrowing.

L1-L2: Trace retrolisthesis with left eccentric mild disc bulge.
Mild facet arthropathy. No spinal canal stenosis or neural foraminal
narrowing.

L2-L3: Minimal disc bulge. Mild facet arthropathy. No spinal canal
stenosis or neural foraminal narrowing.

L3-L4: Mild disc bulge. Moderate facet arthropathy. Ligamentum
flavum hypertrophy. No spinal canal stenosis. Narrowing of the
lateral recesses. Mild bilateral neural foraminal narrowing.

L4-L5: Mild disc bulge. Moderate facet arthropathy. No spinal canal
stenosis. Narrowing of the lateral recesses. Mild-to-moderate right
neural foraminal narrowing.

L5-S1: Trace retrolisthesis with mild disc bulge. Mild facet
arthropathy. No spinal canal stenosis or neural foraminal narrowing.
IMPRESSION: 1. Mild dextrocurvature and multilevel degenerative changes, without
spinal canal stenosis.
2. L3-L4 mild bilateral neural foraminal narrowing with narrowing of
the lateral recesses, which could affect the descending L4 nerve
roots
3. L4-L5 mild-to-moderate right neural foraminal narrowing, with
narrowing of the lateral recesses, which could affect the descending
L5 nerve roots.

## 2021-04-29 IMAGING — MR MR HEAD W/O CM
12 series · 48 of 48 positions shown · non-contrast
Comparison: No prior MRI, correlation is made with CT head
[DATE]

CLINICAL DATA: Fecal incontinence and unsteady gait

EXAM:
MRI HEAD WITHOUT CONTRAST
TECHNIQUE: Multiplanar, multiecho pulse sequences of the brain and surrounding
structures were obtained without intravenous contrast.

[Series 5: ax dwi_tracew · axial · 3.0mm · 0.65mm/px · z∈[-61,+92]mm · 4 of 48 slices shown]
[im 1/48]
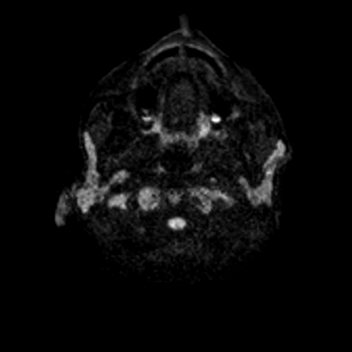
[im 16/48]
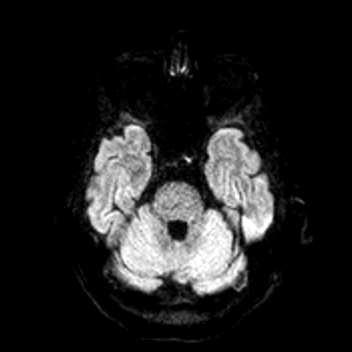
[im 32/48]
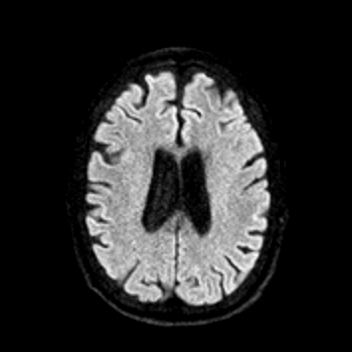
[im 48/48]
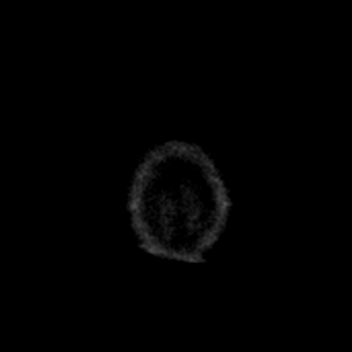

[Series 6: ax dwi_adc · axial · 3.0mm · 0.65mm/px · z∈[-61,+92]mm · 3 of 48 slices shown]
[im 1/48]
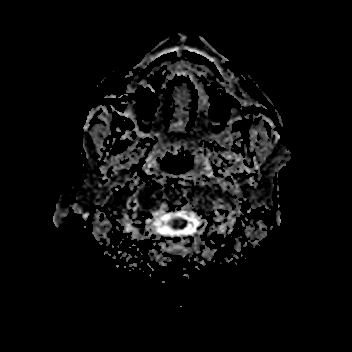
[im 24/48]
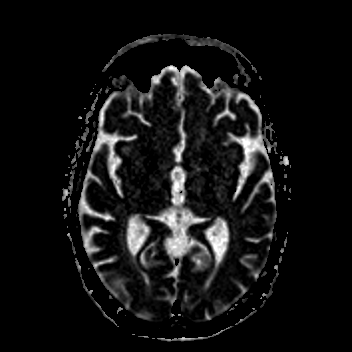
[im 48/48]
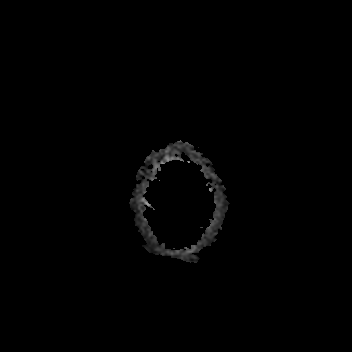

[Series 7: T1 · sagittal · 5.0mm · 0.62mm/px · 2 of 24 slices shown (1 of 2)]
[im 1/24]
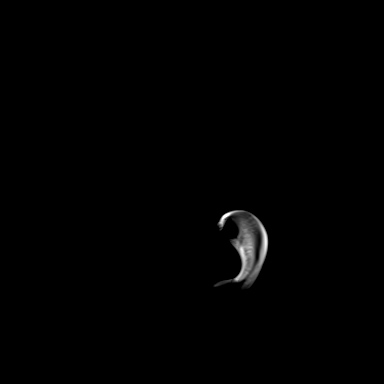
[im 24/24]
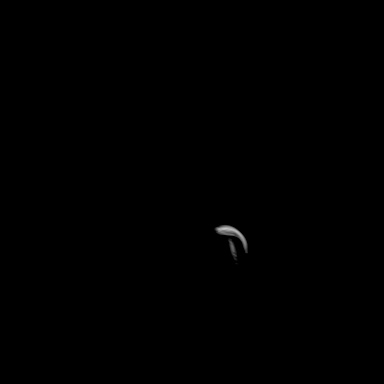

[Series 8: cor dwi_tracew · coronal · 5.0mm · 0.68mm/px · 3 of 40 slices shown]
[im 1/40]
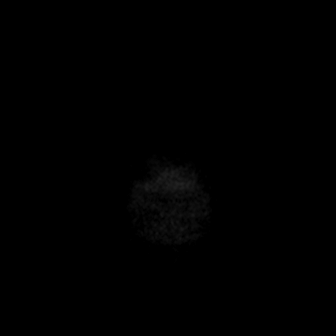
[im 20/40]
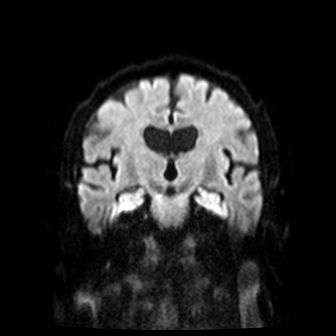
[im 40/40]
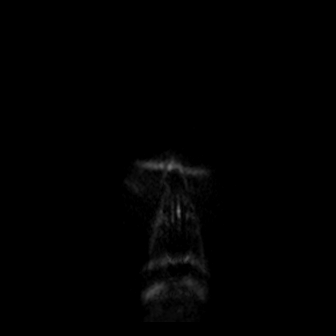

[Series 9: cor dwi_adc · coronal · 5.0mm · 0.68mm/px · 3 of 40 slices shown]
[im 1/40]
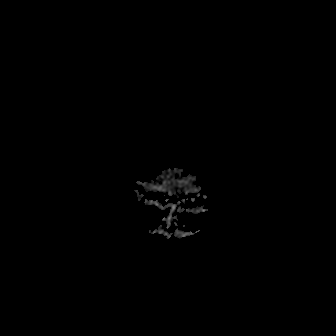
[im 20/40]
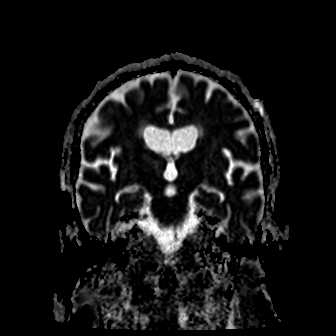
[im 40/40]
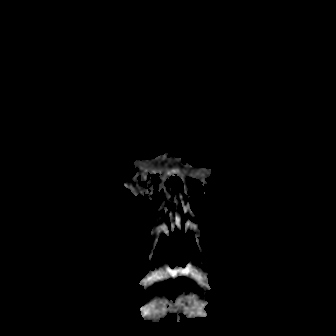

[Series 10: T2 · axial · 5.0mm · 0.53mm/px · z∈[-56,+86]mm · 2 of 25 slices shown (1 of 2)]
[im 1/25]
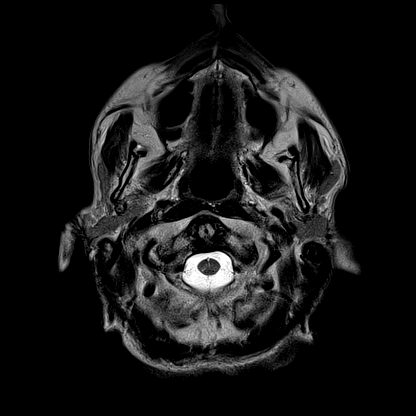
[im 25/25]
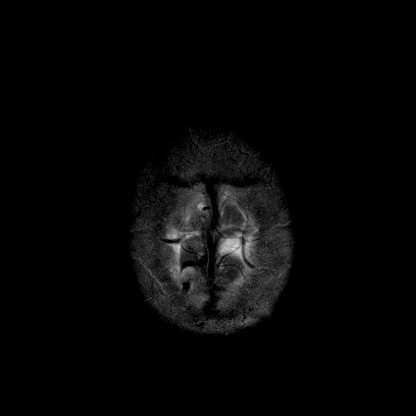

[Series 11: mag_images · axial · 3.0mm · 0.90mm/px · z∈[-72,+102]mm · 4 of 60 slices shown]
[im 1/60]
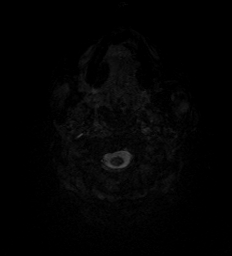
[im 20/60]
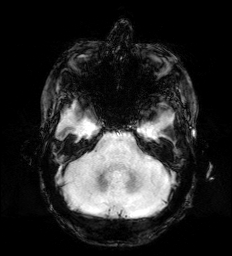
[im 40/60]
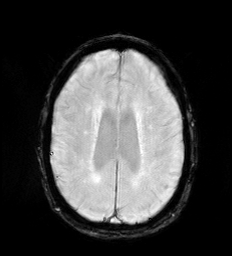
[im 60/60]
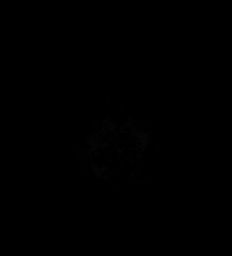

[Series 12: pha_images · axial · 3.0mm · 0.90mm/px · z∈[-72,+102]mm · 4 of 60 slices shown]
[im 1/60]
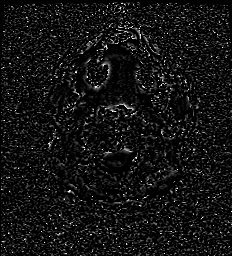
[im 20/60]
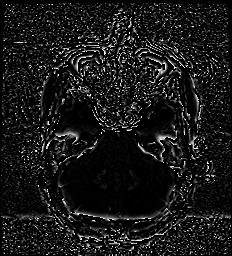
[im 40/60]
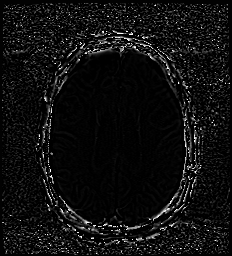
[im 60/60]
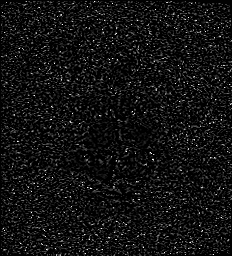

[Series 13: swi_images · axial · 3.0mm · 0.90mm/px · z∈[-72,+102]mm · 4 of 60 slices shown]
[im 1/60]
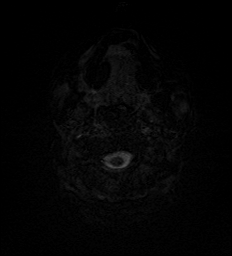
[im 20/60]
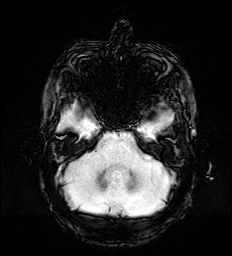
[im 40/60]
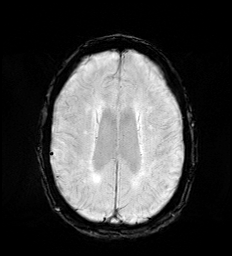
[im 60/60]
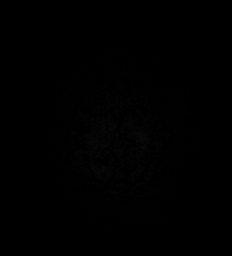

[Series 15: FLAIR · axial · 3.0mm · 0.53mm/px · z∈[-65,+95]mm · 4 of 55 slices shown]
[im 1/55]
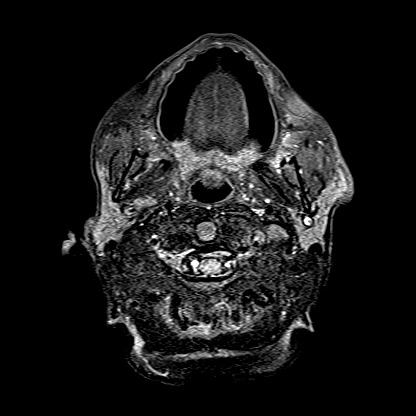
[im 19/55]
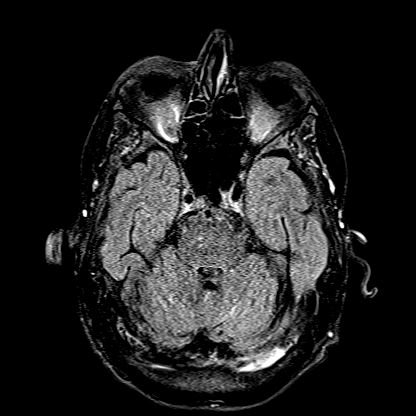
[im 37/55]
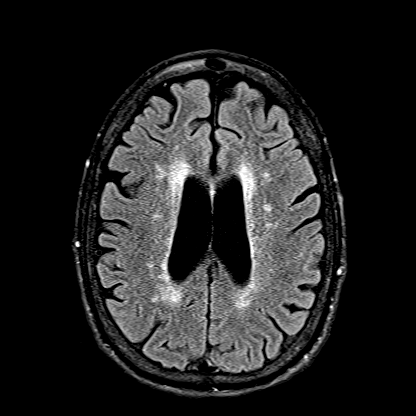
[im 55/55]
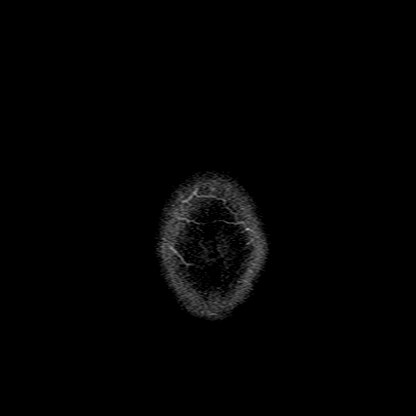

[Series 16: T1 · axial · 1.0mm · 0.98mm/px · z∈[-69,+103]mm · 13 of 176 slices shown (2 of 2)]
[im 1/176]
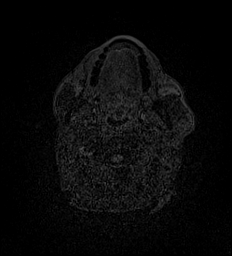
[im 15/176]
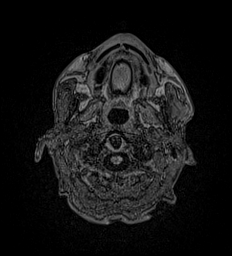
[im 30/176]
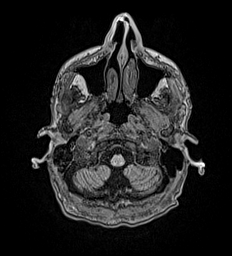
[im 44/176]
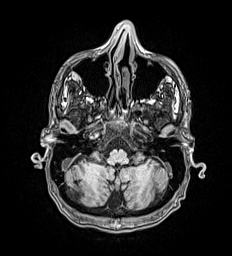
[im 59/176]
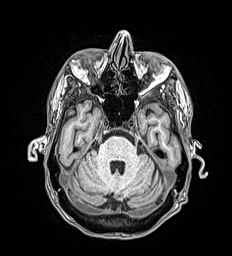
[im 73/176]
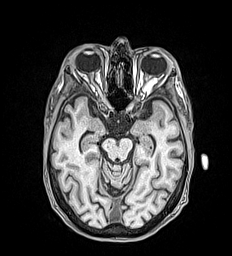
[im 88/176]
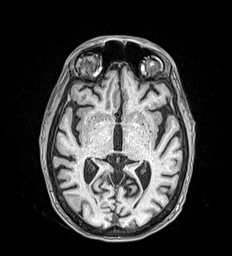
[im 103/176]
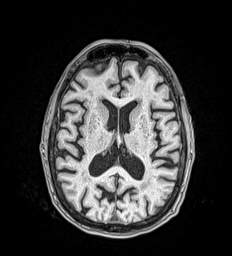
[im 117/176]
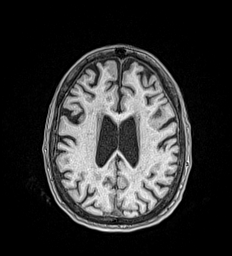
[im 132/176]
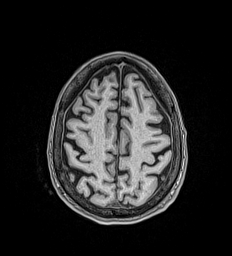
[im 146/176]
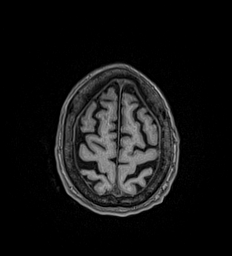
[im 161/176]
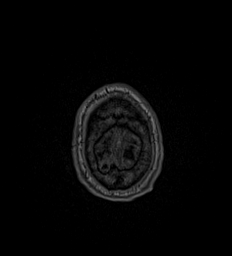
[im 176/176]
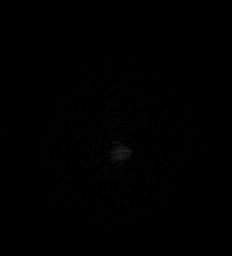

[Series 17: T2 · coronal · 5.0mm · 0.57mm/px · 2 of 29 slices shown (2 of 2)]
[im 1/29]
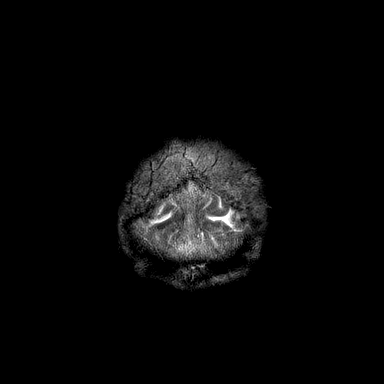
[im 29/29]
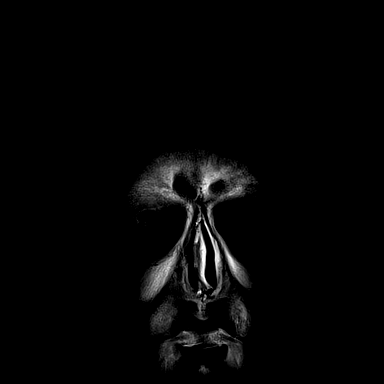

[48 of 48 positions shown; findings below may reference images not displayed]

FINDINGS: Brain: No restricted diffusion to suggest acute or subacute infarct.
No acute hemorrhage, mass, mass effect, or midline shift. Degree of
cerebral atrophy is mildly advanced for age. T2 hyperintense signal
in the periventricular white matter, likely the sequela of moderate
chronic small vessel ischemic disease. Lacunar infarct in the right
cerebellar hemisphere. No evidence of remote cortical infarct or
hemorrhage.

Vascular: Normal flow voids.

Skull and upper cervical spine: Normal marrow signal.

Sinuses/Orbits: Negative.

Other: Fluid in the right mastoid air cells.
IMPRESSION: No acute intracranial process. Mildly advanced cerebral atrophy for
age, with sequela of moderate chronic small vessel ischemic disease.

## 2021-09-30 ENCOUNTER — Other Ambulatory Visit: Payer: Self-pay | Admitting: Gastroenterology

## 2021-09-30 DIAGNOSIS — R634 Abnormal weight loss: Secondary | ICD-10-CM

## 2021-11-06 ENCOUNTER — Ambulatory Visit: Payer: BC Managed Care – PPO

## 2021-12-25 ENCOUNTER — Ambulatory Visit: Admission: RE | Admit: 2021-12-25 | Payer: BC Managed Care – PPO | Source: Home / Self Care

## 2021-12-25 ENCOUNTER — Encounter: Admission: RE | Payer: Self-pay | Source: Home / Self Care

## 2021-12-25 SURGERY — COLONOSCOPY WITH PROPOFOL
Anesthesia: General

## 2021-12-28 ENCOUNTER — Encounter: Payer: Self-pay | Admitting: *Deleted

## 2021-12-29 ENCOUNTER — Encounter: Payer: Self-pay | Admitting: *Deleted

## 2021-12-29 ENCOUNTER — Ambulatory Visit: Payer: BC Managed Care – PPO | Admitting: Anesthesiology

## 2021-12-29 ENCOUNTER — Encounter
Admission: RE | Disposition: A | Discharge status: Home / Self Care | Payer: Self-pay | Source: Home / Self Care | Attending: Gastroenterology

## 2021-12-29 ENCOUNTER — Ambulatory Visit
Admission: RE | Admit: 2021-12-29 | Discharge: 2021-12-29 | Disposition: A | Discharge status: Home / Self Care | Payer: BC Managed Care – PPO | Attending: Gastroenterology | Admitting: Gastroenterology

## 2021-12-29 DIAGNOSIS — I4891 Unspecified atrial fibrillation: Secondary | ICD-10-CM | POA: Diagnosis not present

## 2021-12-29 DIAGNOSIS — K319 Disease of stomach and duodenum, unspecified: Secondary | ICD-10-CM | POA: Insufficient documentation

## 2021-12-29 DIAGNOSIS — K449 Diaphragmatic hernia without obstruction or gangrene: Secondary | ICD-10-CM | POA: Diagnosis not present

## 2021-12-29 DIAGNOSIS — Z9884 Bariatric surgery status: Secondary | ICD-10-CM | POA: Diagnosis not present

## 2021-12-29 DIAGNOSIS — I509 Heart failure, unspecified: Secondary | ICD-10-CM | POA: Diagnosis not present

## 2021-12-29 DIAGNOSIS — I110 Hypertensive heart disease with heart failure: Secondary | ICD-10-CM | POA: Insufficient documentation

## 2021-12-29 DIAGNOSIS — K640 First degree hemorrhoids: Secondary | ICD-10-CM | POA: Insufficient documentation

## 2021-12-29 DIAGNOSIS — K5730 Diverticulosis of large intestine without perforation or abscess without bleeding: Secondary | ICD-10-CM | POA: Insufficient documentation

## 2021-12-29 DIAGNOSIS — Z7901 Long term (current) use of anticoagulants: Secondary | ICD-10-CM | POA: Insufficient documentation

## 2021-12-29 DIAGNOSIS — D509 Iron deficiency anemia, unspecified: Secondary | ICD-10-CM | POA: Diagnosis present

## 2021-12-29 DIAGNOSIS — K2970 Gastritis, unspecified, without bleeding: Secondary | ICD-10-CM | POA: Insufficient documentation

## 2021-12-29 DIAGNOSIS — G4730 Sleep apnea, unspecified: Secondary | ICD-10-CM | POA: Diagnosis not present

## 2021-12-29 DIAGNOSIS — E119 Type 2 diabetes mellitus without complications: Secondary | ICD-10-CM | POA: Diagnosis not present

## 2021-12-29 HISTORY — PX: ESOPHAGOGASTRODUODENOSCOPY: SHX5428

## 2021-12-29 HISTORY — PX: COLONOSCOPY: SHX5424

## 2021-12-29 HISTORY — DX: Prediabetes: R73.03

## 2021-12-29 SURGERY — COLONOSCOPY
Anesthesia: General

## 2021-12-29 MED ORDER — PROPOFOL 500 MG/50ML IV EMUL
INTRAVENOUS | Status: DC | PRN
  Administered 2021-12-29: 100 ug/kg/min via INTRAVENOUS

## 2021-12-29 MED ORDER — LIDOCAINE HCL (CARDIAC) PF 100 MG/5ML IV SOSY
PREFILLED_SYRINGE | INTRAVENOUS | Status: DC | PRN
  Administered 2021-12-29: 50 mg via INTRAVENOUS

## 2021-12-29 MED ORDER — SODIUM CHLORIDE 0.9 % IV SOLN: INTRAVENOUS | Status: DC

## 2021-12-29 MED ORDER — PROPOFOL 1000 MG/100ML IV EMUL
INTRAVENOUS | Status: AC
  Filled 2021-12-29: qty 100

## 2021-12-29 MED ORDER — LIDOCAINE HCL (PF) 2 % IJ SOLN
INTRAMUSCULAR | Status: AC
  Filled 2021-12-29: qty 5

## 2021-12-29 NOTE — Op Note (Signed)
[facility] Patient Name: James Sanford Procedure Date: 12/29/2021 11:59 AM MRN: [id number] Account #: [id number] Date of Birth: [birth year] Admit Type: Outpatient Age: 66 Room: [facility] ENDO ROOM 1 Gender: Male Note Status: Finalized Instrument Name: Upper Endoscope [id number] Procedure:             Upper GI endoscopy Indications:           Iron deficiency anemia Providers:             Andrey Farmer MD, MD Medicines:             Monitored Anesthesia Care Complications:         No immediate complications. Estimated blood loss:                         Minimal. Procedure:             Pre-Anesthesia Assessment:                        - Prior to the procedure, a History and Physical was                         performed, and patient medications and allergies were                         reviewed. The patient is competent. The risks and                         benefits of the procedure and the sedation options and                         risks were discussed with the patient. All questions                         were answered and informed consent was obtained.                         Patient identification and proposed procedure were                         verified by the physician, the nurse, the                         anesthesiologist, the anesthetist and the technician                         in the endoscopy suite. Mental Status Examination:                         alert and oriented. Airway Examination: normal                         oropharyngeal airway and neck mobility. Respiratory                         Examination: clear to auscultation. CV Examination:                         normal. Prophylactic Antibiotics: The patient does not  require prophylactic antibiotics. Prior                         Anticoagulants: The patient has taken Eliquis                         (apixaban), last dose was 6 days prior to procedure.                          ASA Grade Assessment: III - A patient with severe                         systemic disease. After reviewing the risks and                         benefits, the patient was deemed in satisfactory                         condition to undergo the procedure. The anesthesia                         plan was to use monitored anesthesia care (MAC).                         Immediately prior to administration of medications,                         the patient was re-assessed for adequacy to receive                         sedatives. The heart rate, respiratory rate, oxygen                         saturations, blood pressure, adequacy of pulmonary                         ventilation, and response to care were monitored                         throughout the procedure. The physical status of the                         patient was re-assessed after the procedure.                        After obtaining informed consent, the endoscope was                         passed under direct vision. Throughout the procedure,                         the patient's blood pressure, pulse, and oxygen                         saturations were monitored continuously. The                         Endosonoscope was introduced through the mouth, and  advanced to the second part of duodenum. The upper GI                         endoscopy was accomplished without difficulty. The                         patient tolerated the procedure well. Findings:      A 5 cm hiatal hernia was present.      The exam of the esophagus was otherwise normal.      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      Diffuse moderate inflammation characterized by adherent blood, erythema       and granularity was found in the entire examined stomach. Biopsies were       taken with a cold forceps for Helicobacter pylori testing. Estimated       blood loss was minimal.      The examined  duodenum was normal. Impression:            - 5 cm hiatal hernia.                        - A sleeve gastrectomy was found.                        - Gastritis. Biopsied.                        - Normal examined duodenum. Recommendation:        - Await pathology results.                        - Perform a colonoscopy today. Procedure Code(s):     --- Professional ---                        [phone], Esophagogastroduodenoscopy, flexible,                         transoral; with biopsy, single or multiple Diagnosis Code(s):     --- Professional ---                        K44.9, Diaphragmatic hernia without obstruction or                         gangrene                        Z98.84, Bariatric surgery status                        K29.70, Gastritis, unspecified, without bleeding                        D50.9, Iron deficiency anemia, unspecified CPT copyright 2019 American Medical Association. All rights reserved. The codes documented in this report are preliminary and upon coder review may  be revised to meet current compliance requirements. Andrey Farmer MD, MD 12/29/2021 1:46:39 PM Number of Addenda: 0 Note Initiated On: 12/29/2021 11:59 AM Estimated Blood Loss:  Estimated blood loss was minimal.      [facility]

## 2021-12-29 NOTE — Anesthesia Postprocedure Evaluation (Signed)
Anesthesia Post Note  Patient: James Sanford  Procedure(s) Performed: COLONOSCOPY ESOPHAGOGASTRODUODENOSCOPY (EGD)  Patient location during evaluation: PACU Anesthesia Type: General Level of consciousness: awake and alert, oriented and patient cooperative Pain management: pain level controlled Vital Signs Assessment: post-procedure vital signs reviewed and stable Respiratory status: spontaneous breathing, nonlabored ventilation and respiratory function stable Cardiovascular status: blood pressure returned to baseline and stable Postop Assessment: adequate PO intake Anesthetic complications: no   No notable events documented.   Last Vitals:  Vitals:   12/29/21 1330 12/29/21 1350  BP: 111/85 128/88  Pulse:    Resp:    Temp: (!) 35.9 C   SpO2: 100% 99%    Last Pain:  Vitals:   12/29/21 1350  TempSrc:   PainSc: 0-No pain                 Darrin Nipper

## 2021-12-29 NOTE — Interval H&P Note (Signed)
History and Physical Interval Note:  12/29/2021 12:47 PM  James Sanford  has presented today for surgery, with the diagnosis of anemia, weight loss, chronic diarrhea.  The various methods of treatment have been discussed with the patient and family. After consideration of risks, benefits and other options for treatment, the patient has consented to  Procedure(s): COLONOSCOPY (N/A) ESOPHAGOGASTRODUODENOSCOPY (EGD) (N/A) as a surgical intervention.  The patient's history has been reviewed, patient examined, no change in status, stable for surgery.  I have reviewed the patient's chart and labs.  Questions were answered to the patient's satisfaction.     [name]  Ok to proceed with EGD/Colonoscopy

## 2021-12-29 NOTE — Op Note (Signed)
[facility] Patient Name: James Sanford Procedure Date: 12/29/2021 11:58 AM MRN: [id number] Account #: [id number] Date of Birth: 03/02/1956 Admit Type: Outpatient Age: 66 Room: [facility] ENDO ROOM 1 Gender: Male Note Status: Finalized Instrument Name: Colonoscope [id number] Procedure:             Colonoscopy Indications:           Iron deficiency anemia Providers:             Andrey Farmer MD, MD Medicines:             Monitored Anesthesia Care Complications:         No immediate complications. Procedure:             Pre-Anesthesia Assessment:                        - Prior to the procedure, a History and Physical was                         performed, and patient medications and allergies were                         reviewed. The patient is competent. The risks and                         benefits of the procedure and the sedation options and                         risks were discussed with the patient. All questions                         were answered and informed consent was obtained.                         Patient identification and proposed procedure were                         verified by the physician, the nurse, the                         anesthesiologist, the anesthetist and the technician                         in the endoscopy suite. Mental Status Examination:                         alert and oriented. Airway Examination: normal                         oropharyngeal airway and neck mobility. Respiratory                         Examination: clear to auscultation. CV Examination:                         normal. Prophylactic Antibiotics: The patient does not                         require prophylactic antibiotics. Prior  Anticoagulants: The patient has taken Eliquis                         (apixaban), last dose was 6 days prior to procedure.                         ASA Grade Assessment: III - A patient with  severe                         systemic disease. After reviewing the risks and                         benefits, the patient was deemed in satisfactory                         condition to undergo the procedure. The anesthesia                         plan was to use monitored anesthesia care (MAC).                         Immediately prior to administration of medications,                         the patient was re-assessed for adequacy to receive                         sedatives. The heart rate, respiratory rate, oxygen                         saturations, blood pressure, adequacy of pulmonary                         ventilation, and response to care were monitored                         throughout the procedure. The physical status of the                         patient was re-assessed after the procedure.                        After obtaining informed consent, the colonoscope was                         passed under direct vision. Throughout the procedure,                         the patient's blood pressure, pulse, and oxygen                         saturations were monitored continuously. The                         Colonoscope was introduced through the anus with the                         intention of advancing to the cecum. The scope was  advanced to the hepatic flexure before the procedure                         was aborted. Medications were given. The colonoscopy                         was aborted due to the difficulty of the procedure,                         poor bowel prep and significant looping. Changing the                         patient to a prone position and applying abdominal                         pressure did not allow for the successful completion                         of the procedure. Findings:      The perianal and digital rectal examinations were normal.      Multiple small and large-mouthed diverticula were found in the sigmoid        colon, descending colon and transverse colon.      Internal hemorrhoids were found during retroflexion. The hemorrhoids       were Grade I (internal hemorrhoids that do not prolapse). Impression:            - The procedure was aborted due to the difficulty of                         the procedure, poor bowel prep and significant looping.                        - Diverticulosis in the sigmoid colon, in the                         descending colon and in the transverse colon.                        - Internal hemorrhoids.                        - No specimens collected. Recommendation:        - Discharge patient to home.                        - Resume previous diet.                        - Resume Eliquis (apixaban) at prior dose today.                        - Perform a virtual colonoscopy at appointment to be                         scheduled. Procedure Code(s):     --- Professional ---                        [phone], 11, Colonoscopy, flexible; diagnostic,  including collection of specimen(s) by brushing or                         washing, when performed (separate procedure) Diagnosis Code(s):     --- Professional ---                        K64.0, First degree hemorrhoids                        D50.9, Iron deficiency anemia, unspecified                        K57.30, Diverticulosis of large intestine without                         perforation or abscess without bleeding CPT copyright 2019 American Medical Association. All rights reserved. The codes documented in this report are preliminary and upon coder review may  be revised to meet current compliance requirements. Andrey Farmer MD, MD 12/29/2021 1:49:59 PM Number of Addenda: 0 Note Initiated On: 12/29/2021 11:58 AM Total Procedure Duration: 0 hours 19 minutes 6 seconds  Estimated Blood Loss:  Estimated blood loss: none.      [facility]

## 2021-12-29 NOTE — Anesthesia Procedure Notes (Signed)
Date/Time: 12/29/2021 12:59 PM  Performed by: Donalda Ewings, CindyPre-anesthesia Checklist: Patient identified, Emergency Drugs available, Suction available, Patient being monitored and Timeout performed Patient Re-evaluated:Patient Re-evaluated prior to induction Oxygen Delivery Method: Nasal cannula Preoxygenation: Pre-oxygenation with 100% oxygen Induction Type: IV induction Airway Equipment and Method: Bite block Placement Confirmation: positive ETCO2 and CO2 detector

## 2021-12-29 NOTE — Transfer of Care (Signed)
Immediate Anesthesia Transfer of Care Note  Patient: James Sanford  Procedure(s) Performed: COLONOSCOPY ESOPHAGOGASTRODUODENOSCOPY (EGD)  Patient Location: PACU  Anesthesia Type:General  Level of Consciousness: awake and sedated  Airway & Oxygen Therapy: Patient Spontanous Breathing and Patient connected to nasal cannula oxygen  Post-op Assessment: Report given to RN and Post -op Vital signs reviewed and stable  Post vital signs: Reviewed and stable  Last Vitals:  Vitals Value Taken Time  BP    Temp    Pulse    Resp    SpO2      Last Pain:  Vitals:   12/29/21 1202  TempSrc: Temporal  PainSc: 0-No pain         Complications: No notable events documented.

## 2021-12-29 NOTE — H&P (Signed)
Outpatient short stay form Pre-procedure 12/29/2021  [name], MD  Primary Physician: Maryland Pink, MD  Reason for visit:  IDA  History of present illness:    66 y/o gentleman with history of alcohol abuse, pancreatic insufficiency, and IDA here for EGD/Colonoscopy for IDA. Last colonoscopy in 2021 with poor prep. Also with chronic loose stools. Takes eliquis with last dose 6 days ago. History of sleeve gastrectomy and inguinal hernia repair. No family history of GI malignancies.    Current Facility-Administered Medications:    0.9 %  sodium chloride infusion, , Intravenous, Continuous, Georgi Navarrete, Hilton Cork, MD, Last Rate: 20 mL/hr at 12/29/21 1224, New Bag at 12/29/21 1224  Medications Prior to Admission  Medication Sig Dispense Refill Last Dose   CREON 24000-76000 units CPEP Take 2 capsules by mouth 3 (three) times daily.   12/29/2021   ferrous sulfate 325 (65 FE) MG tablet Take 1 tablet (325 mg total) by mouth every other day. 30 tablet 3 Past Week   Saccharomyces boulardii (PROBIOTIC) 250 MG CAPS Take 1 capsule by mouth in the morning and at bedtime. 30 capsule 0 Past Week   ELIQUIS 5 MG TABS tablet Take 5 mg by mouth 2 (two) times daily.   12/22/2021   escitalopram (LEXAPRO) 10 MG tablet Take 1 tablet by mouth daily. (Patient not taking: Reported on 12/29/2021)   Not Taking   FLUoxetine (PROZAC) 20 MG capsule Take 1 capsule (20 mg total) by mouth daily. (Patient not taking: Reported on 12/29/2021) 30 capsule 3 Not Taking   metoprolol succinate (TOPROL-XL) 50 MG 24 hr tablet Take 0.5 tablets (25 mg total) by mouth daily. (Patient not taking: Reported on 12/29/2021) 30 tablet 0 Not Taking     Allergies  Allergen Reactions   Penicillins Diarrhea    TOLERATED CEFAZOLIN 11/25/20 Unknown childhood reaction     Past Medical History:  Diagnosis Date   Arthritis    Hyperlipemia    Hypertension    Pre-diabetes    Sleep apnea     Review of systems:  Otherwise negative.     Physical Exam  Gen: Alert, oriented. Appears stated age.  HEENT: PERRLA. Lungs: No respiratory distress CV: RRR Abd: soft, benign, no masses Ext: No edema    Planned procedures: Proceed with EGD/colonoscopy. The patient understands the nature of the planned procedure, indications, risks, alternatives and potential complications including but not limited to bleeding, infection, perforation, damage to internal organs and possible oversedation/side effects from anesthesia. The patient agrees and gives consent to proceed.  Please refer to procedure notes for findings, recommendations and patient disposition/instructions.     [name], MD [facility]

## 2021-12-29 NOTE — Anesthesia Preprocedure Evaluation (Addendum)
Anesthesia Evaluation  Patient identified by MRN, date of birth, ID band Patient awake    Reviewed: Allergy & Precautions, NPO status , Patient's Chart, lab work & pertinent test results  History of Anesthesia Complications Negative for: history of anesthetic complications  Airway Mallampati: III   Neck ROM: Full    Dental  (+) Edentulous Upper, Edentulous Lower   Pulmonary sleep apnea ,    Pulmonary exam normal breath sounds clear to auscultation       Cardiovascular hypertension, +CHF  + dysrhythmias (a fib on Eliquis)  Rhythm:Irregular Rate:Normal  ECG 03/03/21:  Atrial fibrillation Right bundle branch block Left anterior fascicular block * Bifascicular block * Septal infarct , age undetermined  ECG 05/05/21:  NORMAL LEFT VENTRICULAR SYSTOLIC FUNCTION  WITH MODERATE LVH  NORMAL RIGHT VENTRICULAR SYSTOLIC FUNCTION  NO VALVULAR STENOSIS  TRIVIAL AR, PR  MILD TR, MR  EF 50%    Neuro/Psych PSYCHIATRIC DISORDERS Depression Alcohol use disorder (pt denies)    GI/Hepatic S/p gastric sleeve   Endo/Other  diabetes, Type 2  Renal/GU negative Renal ROS     Musculoskeletal  (+) Arthritis ,   Abdominal   Peds  Hematology negative hematology ROS (+)   Anesthesia Other Findings Cardiology note 09/10/21:  66 year old gentleman, hospitalized following fall and right wrist fracture, noted being in atrial fibrillation/atrial flutter, asymptomatic. 2D echocardiogram revealed mild to moderate reduced left ventricular function with LVEF 35-40% while the patient was in atrial fibrillation. 24-hour Holter monitor revealed persistent, and predominant atrial fibrillation with mean heart rate of 73 bpm. Patient has chads vasc score of 2, and was started on Eliquis. The patient had rectal bleed, with unremarkable upper GI and colonoscopy, currently on Eliquis which appears to be tolerated well. The patient fell recently in the context  of alcohol, fracturing his scapula and humerus. The patient continue to drink and experienced a right hip fracture 11/2020. The patient has subsequently quit drinking alcohol. The patient has essential hypertension, systolic blood pressure mildly elevated today on lisinopril, likely due to dietary noncompliance. 2D echocardiogram 05/05/2021 revealed LV ejection fraction of 50%.  Plan   1. Continue current medications 2. Counseled patient about low-sodium diet 3. DASH diet printed instructions given to the patient 4. Counseled patient about low-cholesterol diet  5. Heart healthy diet printed instructions given to the patient 6. Continue Eliquis 5 mg twice daily for stroke prevention 7. Strongly advised patient to abstain from alcohol 8. Return to clinic for follow-up in 4 months  No orders of the defined types were placed in this encounter.  Return in about 4 months (around 01/10/2022).   Reproductive/Obstetrics                           Anesthesia Physical Anesthesia Plan  ASA: 3  Anesthesia Plan: General   Post-op Pain Management:    Induction: Intravenous  PONV Risk Score and Plan: 2 and Propofol infusion, TIVA and Treatment may vary due to age or medical condition  Airway Management Planned: Natural Airway  Additional Equipment:   Intra-op Plan:   Post-operative Plan:   Informed Consent: I have reviewed the patients History and Physical, chart, labs and discussed the procedure including the risks, benefits and alternatives for the proposed anesthesia with the patient or authorized representative who has indicated his/her understanding and acceptance.       Plan Discussed with: CRNA  Anesthesia Plan Comments: (LMA/GETA backup discussed.  Patient consented for risks of  anesthesia including but not limited to:  - adverse reactions to medications - damage to eyes, teeth, lips or other oral mucosa - nerve damage due to positioning  - sore throat or  hoarseness - damage to heart, brain, nerves, lungs, other parts of body or loss of life  Informed patient about role of CRNA in peri- and intra-operative care.  Patient voiced understanding.)        Anesthesia Quick Evaluation

## 2021-12-30 ENCOUNTER — Encounter: Payer: Self-pay | Admitting: Gastroenterology

## 2021-12-30 LAB — SURGICAL PATHOLOGY

## 2022-02-12 ENCOUNTER — Encounter: Payer: Self-pay | Admitting: Ophthalmology

## 2022-02-16 NOTE — Discharge Instructions (Signed)

## 2022-02-17 NOTE — Anesthesia Preprocedure Evaluation (Addendum)
Anesthesia Evaluation  Patient identified by MRN, date of birth, ID band Patient awake    Reviewed: Allergy & Precautions, NPO status , Patient's Chart, lab work & pertinent test results  History of Anesthesia Complications Negative for: history of anesthetic complications  Airway Mallampati: III   Neck ROM: Full    Dental  (+) Edentulous Upper, Edentulous Lower   Pulmonary sleep apnea    Pulmonary exam normal breath sounds clear to auscultation       Cardiovascular hypertension, +CHF  + dysrhythmias (a fib on Eliquis)  Rhythm:Irregular Rate:Normal  ECG 03/07/22:  Atrial fibrillation Right bundle branch block Left anterior fascicular block * Bifascicular block * Septal infarct , age undetermined  ECG 05/05/21:  NORMAL LEFT VENTRICULAR SYSTOLIC FUNCTION  WITH MODERATE LVH  NORMAL RIGHT VENTRICULAR SYSTOLIC FUNCTION  NO VALVULAR STENOSIS  TRIVIAL AR, PR  MILD TR, MR  EF 50%    Neuro/Psych  PSYCHIATRIC DISORDERS  Depression    Alcohol use disorder (pt denies)    GI/Hepatic S/p gastric sleeve   Endo/Other  diabetes, Type 2    Renal/GU negative Renal ROS     Musculoskeletal  (+) Arthritis ,    Abdominal   Peds  Hematology  (+) Blood dyscrasia, anemia   Anesthesia Other Findings   Reproductive/Obstetrics                             Anesthesia Physical Anesthesia Plan  ASA: 3  Anesthesia Plan: MAC   Post-op Pain Management:    Induction: Intravenous  PONV Risk Score and Plan: 2 and Treatment may vary due to age or medical condition, TIVA and Midazolam  Airway Management Planned: Natural Airway  Additional Equipment:   Intra-op Plan:   Post-operative Plan:   Informed Consent: I have reviewed the patients History and Physical, chart, labs and discussed the procedure including the risks, benefits and alternatives for the proposed anesthesia with the patient or authorized  representative who has indicated his/her understanding and acceptance.     Dental Advisory Given  Plan Discussed with: CRNA  Anesthesia Plan Comments: (Patient consented for risks of anesthesia including but not limited to:  - adverse reactions to medications - damage to eyes, teeth, lips or other oral mucosa - nerve damage due to positioning  - sore throat or hoarseness - Damage to heart, brain, nerves, lungs, other parts of body or loss of life  Patient voiced understanding.)        Anesthesia Quick Evaluation

## 2022-02-18 DIAGNOSIS — B0230 Zoster ocular disease, unspecified: Secondary | ICD-10-CM

## 2022-02-18 DIAGNOSIS — N179 Acute kidney failure, unspecified: Secondary | ICD-10-CM

## 2022-03-05 ENCOUNTER — Other Ambulatory Visit: Payer: Self-pay

## 2022-03-05 ENCOUNTER — Emergency Department: Payer: BC Managed Care – PPO

## 2022-03-05 DIAGNOSIS — E119 Type 2 diabetes mellitus without complications: Secondary | ICD-10-CM | POA: Insufficient documentation

## 2022-03-05 DIAGNOSIS — F1014 Alcohol abuse with alcohol-induced mood disorder: Secondary | ICD-10-CM | POA: Diagnosis not present

## 2022-03-05 DIAGNOSIS — Y909 Presence of alcohol in blood, level not specified: Secondary | ICD-10-CM | POA: Diagnosis not present

## 2022-03-05 DIAGNOSIS — E876 Hypokalemia: Secondary | ICD-10-CM | POA: Insufficient documentation

## 2022-03-05 DIAGNOSIS — D649 Anemia, unspecified: Secondary | ICD-10-CM | POA: Insufficient documentation

## 2022-03-05 DIAGNOSIS — Z7901 Long term (current) use of anticoagulants: Secondary | ICD-10-CM | POA: Diagnosis not present

## 2022-03-05 DIAGNOSIS — W1811XA Fall from or off toilet without subsequent striking against object, initial encounter: Secondary | ICD-10-CM | POA: Insufficient documentation

## 2022-03-05 DIAGNOSIS — R55 Syncope and collapse: Secondary | ICD-10-CM | POA: Diagnosis not present

## 2022-03-05 DIAGNOSIS — F1024 Alcohol dependence with alcohol-induced mood disorder: Secondary | ICD-10-CM | POA: Insufficient documentation

## 2022-03-05 DIAGNOSIS — I10 Essential (primary) hypertension: Secondary | ICD-10-CM | POA: Diagnosis not present

## 2022-03-05 DIAGNOSIS — R42 Dizziness and giddiness: Secondary | ICD-10-CM | POA: Diagnosis present

## 2022-03-05 LAB — CBC WITH DIFFERENTIAL/PLATELET
Abs Immature Granulocytes: 0.01 10*3/uL (ref 0.00–0.07)
Basophils Absolute: 0 10*3/uL (ref 0.0–0.1)
Basophils Relative: 1 %
Eosinophils Absolute: 0 10*3/uL (ref 0.0–0.5)
Eosinophils Relative: 1 %
HCT: 32.7 % — ABNORMAL LOW (ref 39.0–52.0)
Hemoglobin: 9.9 g/dL — ABNORMAL LOW (ref 13.0–17.0)
Immature Granulocytes: 0 %
Lymphocytes Relative: 30 %
Lymphs Abs: 1.2 10*3/uL (ref 0.7–4.0)
MCH: 26.8 pg (ref 26.0–34.0)
MCHC: 30.3 g/dL (ref 30.0–36.0)
MCV: 88.4 fL (ref 80.0–100.0)
Monocytes Absolute: 0.3 10*3/uL (ref 0.1–1.0)
Monocytes Relative: 7 %
Neutro Abs: 2.4 10*3/uL (ref 1.7–7.7)
Neutrophils Relative %: 61 %
Platelets: 160 10*3/uL (ref 150–400)
RBC: 3.7 MIL/uL — ABNORMAL LOW (ref 4.22–5.81)
RDW: 18 % — ABNORMAL HIGH (ref 11.5–15.5)
WBC: 3.8 10*3/uL — ABNORMAL LOW (ref 4.0–10.5)
nRBC: 0 % (ref 0.0–0.2)

## 2022-03-05 LAB — COMPREHENSIVE METABOLIC PANEL
ALT: 23 U/L (ref 0–44)
AST: 29 U/L (ref 15–41)
Albumin: 3.5 g/dL (ref 3.5–5.0)
Alkaline Phosphatase: 106 U/L (ref 38–126)
Anion gap: 8 (ref 5–15)
BUN: 17 mg/dL (ref 8–23)
CO2: 23 mmol/L (ref 22–32)
Calcium: 8.1 mg/dL — ABNORMAL LOW (ref 8.9–10.3)
Chloride: 114 mmol/L — ABNORMAL HIGH (ref 98–111)
Creatinine, Ser: 0.63 mg/dL (ref 0.61–1.24)
GFR, Estimated: >60 mL/min (ref >60)
Glucose, Bld: 95 mg/dL (ref 70–99)
Potassium: 2.8 mmol/L — ABNORMAL LOW (ref 3.5–5.1)
Sodium: 145 mmol/L (ref 135–145)
Total Bilirubin: 0.9 mg/dL (ref 0.3–1.2)
Total Protein: 6.7 g/dL (ref 6.5–8.1)

## 2022-03-05 NOTE — ED Notes (Signed)
First nurse note:  Fall at home unsure of mechanism. ETOH on board per pt report. Pt daily drinker. Pt on daily thinners. Pt states to EMS that he "needs help". Hx of afib and dementia. Pt denies hitting hear or LOC with fall. Pt noted to be diffusely covered in feces on arrival. Pt spouse requested placement to EMS. Per report pt denies wanting this. Spouse reported to EMS noncompliance with home meds.   HR 48-57, irregular with hx of afib 120/76 RR 18 100% RA

## 2022-03-05 NOTE — ED Triage Notes (Signed)
Pt is on a blood thinner eliquis.

## 2022-03-05 NOTE — ED Triage Notes (Signed)
Pt sts he was sitting on the toilet having a BM and he fell off the toilet. Pt denies LOC and sts it was a mechanical fall. Pt denies any pain or injury.Pt is covered in feces at this time. Sts he lives at home with his wife.

## 2022-03-06 ENCOUNTER — Emergency Department
Admission: EM | Admit: 2022-03-06 | Discharge: 2022-03-07 | Disposition: A | Discharge status: Home / Self Care | Payer: BC Managed Care – PPO | Attending: Emergency Medicine | Admitting: Emergency Medicine

## 2022-03-06 DIAGNOSIS — F1014 Alcohol abuse with alcohol-induced mood disorder: Secondary | ICD-10-CM | POA: Diagnosis present

## 2022-03-06 DIAGNOSIS — W19XXXA Unspecified fall, initial encounter: Secondary | ICD-10-CM

## 2022-03-06 DIAGNOSIS — R55 Syncope and collapse: Secondary | ICD-10-CM

## 2022-03-06 DIAGNOSIS — E876 Hypokalemia: Secondary | ICD-10-CM

## 2022-03-06 LAB — MAGNESIUM: Magnesium: 2 mg/dL (ref 1.7–2.4)

## 2022-03-06 MED ORDER — PANCRELIPASE (LIP-PROT-AMYL) 12000-38000 UNITS PO CPEP
24000.0000 [IU] | ORAL_CAPSULE | Freq: Three times a day (TID) | ORAL | Status: DC
  Administered 2022-03-06 – 2022-03-07 (×2): 24000 [IU] via ORAL
  Filled 2022-03-06 (×3): qty 2

## 2022-03-06 MED ORDER — POTASSIUM CHLORIDE CRYS ER 20 MEQ PO TBCR: 20.0000 meq | EXTENDED_RELEASE_TABLET | Freq: Two times a day (BID) | ORAL | 0 refills | Status: DC

## 2022-03-06 MED ORDER — FERROUS SULFATE 325 (65 FE) MG PO TABS
325.0000 mg | ORAL_TABLET | Freq: Every day | ORAL | Status: DC
  Administered 2022-03-06: 325 mg via ORAL
  Filled 2022-03-06: qty 1

## 2022-03-06 MED ORDER — MOXIFLOXACIN HCL 0.5 % OP SOLN: 1.0000 [drp] | Freq: Four times a day (QID) | OPHTHALMIC | Status: DC

## 2022-03-06 MED ORDER — CHOLESTYRAMINE 4 G PO PACK
1.0000 | PACK | Freq: Every day | ORAL | Status: DC
  Administered 2022-03-06 – 2022-03-07 (×2): 1 via ORAL
  Filled 2022-03-06 (×2): qty 1

## 2022-03-06 MED ORDER — GATIFLOXACIN 0.5 % OP SOLN
1.0000 [drp] | Freq: Four times a day (QID) | OPHTHALMIC | Status: DC
  Filled 2022-03-06: qty 2.5

## 2022-03-06 MED ORDER — VITAMIN D 25 MCG (1000 UNIT) PO TABS
1000.0000 [IU] | ORAL_TABLET | Freq: Every morning | ORAL | Status: DC
  Administered 2022-03-07: 1000 [IU] via ORAL
  Filled 2022-03-06: qty 1

## 2022-03-06 MED ORDER — POTASSIUM CHLORIDE CRYS ER 20 MEQ PO TBCR
20.0000 meq | EXTENDED_RELEASE_TABLET | Freq: Once | ORAL | Status: AC
  Administered 2022-03-06: 20 meq via ORAL
  Filled 2022-03-06: qty 1

## 2022-03-06 MED ORDER — APIXABAN 5 MG PO TABS
5.0000 mg | ORAL_TABLET | Freq: Two times a day (BID) | ORAL | Status: DC
  Administered 2022-03-06 – 2022-03-07 (×2): 5 mg via ORAL
  Filled 2022-03-06 (×2): qty 1

## 2022-03-06 MED ORDER — POTASSIUM CHLORIDE CRYS ER 20 MEQ PO TBCR
40.0000 meq | EXTENDED_RELEASE_TABLET | Freq: Once | ORAL | Status: AC
  Administered 2022-03-06: 40 meq via ORAL
  Filled 2022-03-06: qty 2

## 2022-03-06 MED ORDER — FERROUS SULFATE 325 (65 FE) MG PO TABS: 325.0000 mg | ORAL_TABLET | ORAL | Status: DC

## 2022-03-06 MED ORDER — THIAMINE HCL 100 MG PO TABS
100.0000 mg | ORAL_TABLET | Freq: Three times a day (TID) | ORAL | Status: DC
  Administered 2022-03-06 – 2022-03-07 (×2): 100 mg via ORAL
  Filled 2022-03-06 (×5): qty 1

## 2022-03-06 MED ORDER — VITAMIN D (ERGOCALCIFEROL) 1.25 MG (50000 UNIT) PO CAPS
50000.0000 [IU] | ORAL_CAPSULE | ORAL | Status: DC
  Filled 2022-03-06: qty 1

## 2022-03-06 MED ORDER — OYSTER SHELL CALCIUM/D3 500-5 MG-MCG PO TABS
1.0000 | ORAL_TABLET | Freq: Every day | ORAL | Status: DC
  Administered 2022-03-06 – 2022-03-07 (×2): 1 via ORAL
  Filled 2022-03-06 (×2): qty 1

## 2022-03-06 MED ORDER — PREDNISOLONE ACETATE 1 % OP SUSP
1.0000 [drp] | Freq: Four times a day (QID) | OPHTHALMIC | Status: DC
  Administered 2022-03-06: 1 [drp] via OPHTHALMIC
  Filled 2022-03-06: qty 5

## 2022-03-06 NOTE — ED Notes (Addendum)
Pt's wife [name]) called this RN. Wife stated pt has had multiple falls, has dementia, and is an alcoholic. States she would return with the oven on from the pt. States she would be interested in home health and believes he would refuse a nursing facility. States she is not home during the day.

## 2022-03-06 NOTE — BH Assessment (Signed)
Comprehensive Clinical Assessment (CCA) Screening, Triage and Referral Note  03/06/2022 James Sanford [id number] Recommendations for Services/Supports/Treatments: Psych consult/disposition pending Kamali P. Selvey is a 66 year old., Caucasian, Non-Hispanic, English speaking male with a hx of alcohol use disorder, moderate dependence Pt is Voluntary.  Upon assessment, Pt was alert, oriented, and able to answer questions. Pt denied making any SI statements to wife. Pt denied having a concrete plan or having a hx of suicide attempts. Pt explained that while he'd drank a little prior to arrival, he does not have a desire for treatment or see his use as problematic. Pt minimized his usage and had limited insight into having a problem. Pt had fair judgment and adequate reality testing. Pt had an appropriate mood and a congruent affect. Pt denied current SI/HI/AV/H.   Chief Complaint:  Chief Complaint  Patient presents with   Fall   Visit Diagnosis: Alcohol use disorder, moderate dependence  Patient Reported Information How did you hear about [place]? No data recorded What Is the Reason for Your Visit/Call Today? Wife further explained that she is concerned that pt verbalized SI prior to ED visit. States she wanted to file IVC paperwork later this morning. Wife was informed that pt has denied SI/HI during his visit. Pt has also behaved appropriately.  How Long Has This Been Causing You Problems? <Week  What Do You Feel Would Help You the Most Today? -- (Pt does not feel treatment is needed.)   Have You Recently Had Any Thoughts About Hurting Yourself? No  Are You Planning to Commit Suicide/Harm Yourself At This time? No   Have you Recently Had Thoughts About Hurting Someone Karolee Ohs? No  Are You Planning to Harm Someone at This Time? No  Explanation: No data recorded  Have You Used Any Alcohol or Drugs in the Past 24 Hours? Yes  How Long Ago Did You Use Drugs or Alcohol? No data  recorded What Did You Use and How Much? Unknown amount of alcohol.   Do You Currently Have a Therapist/Psychiatrist? No  Name of Therapist/Psychiatrist: No data recorded  Have You Been Recently Discharged From Any Office Practice or Programs? No  Explanation of Discharge From Practice/Program: No data recorded   CCA Screening Triage Referral Assessment Type of Contact: Face-to-Face  Telemedicine Service Delivery:   Is this Initial or Reassessment?   Date Telepsych consult ordered in [facility]:    Time Telepsych consult ordered in [facility]:    Location of Assessment: [facility] ED  Provider Location: [facility] ED    Collateral Involvement: EXCELL, NEYLAND (Spouse) [phone]   Does Patient Have a Court Appointed Legal Guardian? No data recorded Name and Contact of Legal Guardian: No data recorded If Minor and Not Living with Parent(s), Who has Custody? No data recorded Is CPS involved or ever been involved? Never  Is APS involved or ever been involved? Never   Patient Determined To Be At Risk for Harm To Self or Others Based on Review of Patient Reported Information or Presenting Complaint? No  Method: No Plan  Availability of Means: No access or NA  Intent: Vague intent or NA  Notification Required: No need or identified person  Additional Information for Danger to Others Potential: No data recorded Additional Comments for Danger to Others Potential: No data recorded Are There Guns or Other Weapons in Your Home? No data recorded Types of Guns/Weapons: No data recorded Are These Weapons Safely Secured?  No data recorded Who Could Verify You Are Able To Have These Secured: No data recorded Do You Have any Outstanding Charges, Pending Court Dates, Parole/Probation? No data recorded Contacted To Inform of Risk of Harm To Self or Others: No data recorded  Does Patient Present under Involuntary Commitment? No    [place] of Residence: [place]   Patient  Currently Receiving the Following Services: Not Receiving Services   Determination of Need: Emergent (2 hours)   Options For Referral: ED Visit   Discharge Disposition:     Taheem Fricke R Klaryssa Fauth, LCAS

## 2022-03-06 NOTE — Consult Note (Signed)
[facility] Face-to-Face Psychiatry Consult   Reason for Consult:  alcohol abuse Referring Physician:  EDP Patient Identification: Wesly Whisenant MRN:  [id number] Principal Diagnosis: Alcohol abuse with alcohol-induced mood disorder (HCC) Diagnosis:  Principal Problem:   Alcohol abuse with alcohol-induced mood disorder (HCC)   Total Time spent with patient: 45 minutes  Subjective:   Jovin Fester is a 66 y.o. male patient admitted with alcohol abuse.  HPI:  66 yo male presented to the ED after falling off the toilet and hitting his head, reports he "drank a little".  He denies suicidal ideations on assessment and continues to deny suicidal ideations today.  No psychosis, homicidal ideations, or withdrawal symptoms.  When he was discharged yesterday, the wife reported suicidal ideations along with concerns that she or he can no longer care for him.   Today, she reports his dementia is an issue as well along with an increase in his drinking.  No signs of detox, blood pressure WDL and pulse is 48.  The client is friendly and cooperative with no desire to explore rehab as "I have my AA group".  He denies any psychiatric concerns, psych cleared.  Past Psychiatric History: anxiety, alcohol use d/o  Risk to Self:  none Risk to Others:  none Prior Inpatient Therapy:  none Prior Outpatient Therapy:  none  Past Medical History:  Past Medical History:  Diagnosis Date   A-fib (HCC)    Anemia    Arthritis    Hyperlipemia    Hypertension    Memory deficit    Pre-diabetes    Sleep apnea     Past Surgical History:  Procedure Laterality Date   COLONOSCOPY N/A 12/29/2021   Procedure: COLONOSCOPY;  Surgeon: Regis Bill, MD;  Location: [facility] ENDOSCOPY;  Service: Endoscopy;  Laterality: N/A;   COLONOSCOPY WITH PROPOFOL N/A 01/28/2020   Procedure: COLONOSCOPY WITH PROPOFOL;  Surgeon: Toney Reil, MD;  Location: [facility] ENDOSCOPY;  Service: Gastroenterology;  Laterality: N/A;    COLONOSCOPY WITH PROPOFOL N/A 01/28/2020   Procedure: COLONOSCOPY WITH PROPOFOL;  Surgeon: Toney Reil, MD;  Location: [facility] ENDOSCOPY;  Service: Gastroenterology;  Laterality: N/A;   ESOPHAGOGASTRODUODENOSCOPY N/A 12/29/2021   Procedure: ESOPHAGOGASTRODUODENOSCOPY (EGD);  Surgeon: Regis Bill, MD;  Location: [facility] ENDOSCOPY;  Service: Endoscopy;  Laterality: N/A;   ESOPHAGOGASTRODUODENOSCOPY (EGD) WITH PROPOFOL N/A 01/28/2020   Procedure: ESOPHAGOGASTRODUODENOSCOPY (EGD) WITH PROPOFOL;  Surgeon: Toney Reil, MD;  Location: [facility] ENDOSCOPY;  Service: Gastroenterology;  Laterality: N/A;   GASTRIC BYPASS     HERNIA REPAIR     INTRAMEDULLARY (IM) NAIL INTERTROCHANTERIC Left 11/25/2020   Procedure: INTRAMEDULLARY (IM) NAIL INTERTROCHANTRIC;  Surgeon: Lyndle Herrlich, MD;  Location: [facility] ORS;  Service: Orthopedics;  Laterality: Left;   JOINT REPLACEMENT Bilateral    Knee surgery   OPEN REDUCTION INTERNAL FIXATION (ORIF) DISTAL RADIAL FRACTURE Right 10/30/2017   Procedure: OPEN REDUCTION INTERNAL FIXATION (ORIF) DISTAL RADIAL FRACTURE;  Surgeon: Garnette Gunner, MD;  Location: [facility] ORS;  Service: Orthopedics;  Laterality: Right;   ORIF FEMUR FRACTURE Right 01/29/2020   Procedure: OPEN REDUCTION INTERNAL FIXATION (ORIF) DISTAL FEMUR FRACTURE;  Surgeon: Christena Flake, MD;  Location: [facility] ORS;  Service: Orthopedics;  Laterality: Right;   Family History:  Family History  Problem Relation Age of Onset   Stroke Mother    Suicidality Father    Family Psychiatric  History: see above Social History:  Social History   Substance and Sexual Activity  Alcohol Use Yes   Comment:  beer occasionally     Social History   Substance and Sexual Activity  Drug Use Not Currently    Social History   Socioeconomic History   Marital status: Married    Spouse name: Not on file   Number of children: Not on file   Years of education: Not on file   Highest education level: Not on file   Occupational History   Not on file  Tobacco Use   Smoking status: Never   Smokeless tobacco: Former    Types: Chew   Tobacco comments:    quir about 4 years ago  Vaping Use   Vaping Use: Never used  Substance and Sexual Activity   Alcohol use: Yes    Comment: beer occasionally   Drug use: Not Currently   Sexual activity: Not on file  Other Topics Concern   Not on file  Social History Narrative   Lives at home with his wife. Independent baseline   Social Determinants of Corporate investment banker Strain: Not on file  Food Insecurity: Not on file  Transportation Needs: Not on file  Physical Activity: Not on file  Stress: Not on file  Social Connections: Not on file   Additional Social History:    Allergies:   Allergies  Allergen Reactions   Penicillins Diarrhea    TOLERATED CEFAZOLIN 11/25/20 Unknown childhood reaction    Labs:  Results for orders placed or performed during the hospital encounter of 03/06/22 (from the past 48 hour(s))  CBC with Differential     Status: Abnormal   Collection Time: 03/05/22 10:31 PM  Result Value Ref Range   WBC 3.8 (L) 4.0 - 10.5 K/uL   RBC 3.70 (L) 4.22 - 5.81 MIL/uL   Hemoglobin 9.9 (L) 13.0 - 17.0 g/dL   HCT 25.9 (L) 56.3 - 87.5 %   MCV 88.4 80.0 - 100.0 fL   MCH 26.8 26.0 - 34.0 pg   MCHC 30.3 30.0 - 36.0 g/dL   RDW 64.3 (H) 32.9 - 51.8 %   Platelets 160 150 - 400 K/uL   nRBC 0.0 0.0 - 0.2 %   Neutrophils Relative % 61 %   Neutro Abs 2.4 1.7 - 7.7 K/uL   Lymphocytes Relative 30 %   Lymphs Abs 1.2 0.7 - 4.0 K/uL   Monocytes Relative 7 %   Monocytes Absolute 0.3 0.1 - 1.0 K/uL   Eosinophils Relative 1 %   Eosinophils Absolute 0.0 0.0 - 0.5 K/uL   Basophils Relative 1 %   Basophils Absolute 0.0 0.0 - 0.1 K/uL   Immature Granulocytes 0 %   Abs Immature Granulocytes 0.01 0.00 - 0.07 K/uL    Comment: Performed at [facility], [address] [place], [place] [address]  Comprehensive metabolic panel      Status: Abnormal   Collection Time: 03/05/22 10:31 PM  Result Value Ref Range   Sodium 145 135 - 145 mmol/L   Potassium 2.8 (L) 3.5 - 5.1 mmol/L   Chloride 114 (H) 98 - 111 mmol/L   CO2 23 22 - 32 mmol/L   Glucose, Bld 95 70 - 99 mg/dL    Comment: Glucose reference range applies only to samples taken after fasting for at least 8 hours.   BUN 17 8 - 23 mg/dL   Creatinine, Ser 0.63 0.61 - 1.24 mg/dL   Calcium 8.1 (L) 8.9 - 10.3 mg/dL   Total Protein 6.7 6.5 - 8.1 g/dL   Albumin 3.5 3.5 - 5.0  g/dL   AST 29 15 - 41 U/L   ALT 23 0 - 44 U/L   Alkaline Phosphatase 106 38 - 126 U/L   Total Bilirubin 0.9 0.3 - 1.2 mg/dL   GFR, Estimated >96 >78 mL/min    Comment: (NOTE) Calculated using the CKD-EPI Creatinine Equation (2021)    Anion gap 8 5 - 15    Comment: Performed at [facility], [address], [place], [place] [address]    Current Facility-Administered Medications  Medication Dose Route Frequency Provider Last Rate Last Admin   apixaban (ELIQUIS) tablet 5 mg  5 mg Oral BID Chesley Noon, MD       potassium chloride SA (KLOR-CON M) CR tablet 40 mEq  40 mEq Oral Once Chesley Noon, MD       Current Outpatient Medications  Medication Sig Dispense Refill   potassium chloride SA (KLOR-CON M20) 20 MEQ tablet Take 1 tablet (20 mEq total) by mouth 2 (two) times daily for 7 days. 14 tablet 0   CREON 24000-76000 units CPEP Take 2 capsules by mouth 3 (three) times daily.     ELIQUIS 5 MG TABS tablet Take 5 mg by mouth 2 (two) times daily.     escitalopram (LEXAPRO) 10 MG tablet Take 1 tablet by mouth daily. (Patient not taking: Reported on 12/29/2021)     ferrous sulfate 325 (65 FE) MG tablet Take 1 tablet (325 mg total) by mouth every other day. 30 tablet 3   FLUoxetine (PROZAC) 20 MG capsule Take 1 capsule (20 mg total) by mouth daily. (Patient not taking: Reported on 12/29/2021) 30 capsule 3   metoprolol succinate (TOPROL-XL) 50 MG 24 hr tablet Take 0.5 tablets (25 mg total)  by mouth daily. (Patient not taking: Reported on 12/29/2021) 30 tablet 0   Saccharomyces boulardii (PROBIOTIC) 250 MG CAPS Take 1 capsule by mouth in the morning and at bedtime. (Patient not taking: Reported on 02/12/2022) 30 capsule 0    Musculoskeletal: Strength & Muscle Tone: within normal limits Gait & Station: normal Patient leans: N/A  Psychiatric Specialty Exam: Physical Exam Vitals and nursing note reviewed.  Constitutional:      Appearance: Normal appearance.  HENT:     Head: Normocephalic.     Nose: Nose normal.  Cardiovascular:     Pulses: Normal pulses.  Musculoskeletal:        General: Normal range of motion.     Cervical back: Normal range of motion.  Neurological:     General: No focal deficit present.     Mental Status: He is alert and oriented to person, place, and time.  Psychiatric:        Attention and Perception: Attention and perception normal.        Mood and Affect: Mood is anxious.        Speech: Speech normal.        Behavior: Behavior normal. Behavior is cooperative.        Thought Content: Thought content normal.        Cognition and Memory: Cognition normal. Memory is impaired.        Judgment: Judgment normal.     Review of Systems  Psychiatric/Behavioral:  Positive for substance abuse. The patient is nervous/anxious.   All other systems reviewed and are negative.   Blood pressure 118/73, pulse (!) 48, temperature 97.7 F (36.5 C), temperature source Oral, resp. rate 14, SpO2 100 %.There is no height or weight on file to calculate BMI.  General  Appearance: Casual  Eye Contact:  Good  Speech:  Normal Rate  Volume:  Normal  Mood:  Anxious  Affect:  Congruent  Thought Process:  Coherent  Orientation:  Full (Time, Place, and Person)  Thought Content:  WDL and Logical  Suicidal Thoughts:  No  Homicidal Thoughts:  No  Memory:  Immediate;   Fair Recent;   Fair Remote;   Fair  Judgement:  Fair  Insight:  Fair  Psychomotor Activity:  Normal   Concentration:  Concentration: Good and Attention Span: Good  Recall:  Good  Fund of Knowledge:  Good  Language:  Good  Akathisia:  No  Handed:  Right  AIMS (if indicated):     Assets:  Leisure Time Physical Health Resilience Social Support  ADL's:  Intact  Cognition:  WNL  Sleep:        Physical Exam: Physical Exam Vitals and nursing note reviewed.  Constitutional:      Appearance: Normal appearance.  HENT:     Head: Normocephalic.     Nose: Nose normal.  Cardiovascular:     Pulses: Normal pulses.  Musculoskeletal:        General: Normal range of motion.     Cervical back: Normal range of motion.  Neurological:     General: No focal deficit present.     Mental Status: He is alert and oriented to person, place, and time.  Psychiatric:        Attention and Perception: Attention and perception normal.        Mood and Affect: Mood is anxious.        Speech: Speech normal.        Behavior: Behavior normal. Behavior is cooperative.        Thought Content: Thought content normal.        Cognition and Memory: Cognition normal. Memory is impaired.        Judgment: Judgment normal.    Review of Systems  Psychiatric/Behavioral:  Positive for substance abuse. The patient is nervous/anxious.   All other systems reviewed and are negative.  Blood pressure 118/73, pulse (!) 48, temperature 97.7 F (36.5 C), temperature source Oral, resp. rate 14, SpO2 100 %. There is no height or weight on file to calculate BMI.  Treatment Plan Summary: Alcohol abuse with alcohol-induced mood disorder: Follow up with AA  Disposition: No evidence of imminent risk to self or others at present.   Patient does not meet criteria for psychiatric inpatient admission. Supportive therapy provided about ongoing stressors.  Nanine Means, NP 03/06/2022 10:59 AM

## 2022-03-06 NOTE — Discharge Instructions (Signed)
Take the potassium as prescribed.  Make sure to drink plenty of fluids.  Follow-up with your regular doctor.  Return to the ER for new, worsening, or persistent severe dizziness or lightheadedness, recurrent episodes of weakness or falls, worsening diarrhea, severe headache, vomiting, changes in your vision, chest pain or difficulty breathing, high fever, or any other new or worsening symptoms that concern you.

## 2022-03-06 NOTE — BH Assessment (Signed)
This writer attempted to reach pt's wife James Sanford [phone]); however there was no answer. A HIPAA compliant voicemail requesting a return phone call.

## 2022-03-06 NOTE — ED Notes (Signed)
Called pts wife, per pt's request, to coordinate transport for discharge. Wife [name] was called at [phone]. Informed ED visit is complete at this time and pt can be picked up. Wife states inability to pick up pt at this time. Pt is cleared for discharge and is welcomed to wait in the lobby to be picked up, but unfortunately could not stay in the room as his evaluation is complete. Wife further explained that she is concerned that pt verbalized SI prior to ED visit. States she wanted to file IVC paperwork later this morning. Wife was informed that pt has denied SI/HI during his visit. Pt has also behaved appropriately. Discussed wifes concerns with EDP Siadecki, who will reach back out to wife.

## 2022-03-06 NOTE — ED Notes (Signed)
Pt threatening to leave. Refusing medicine until he can take it at home

## 2022-03-06 NOTE — ED Notes (Signed)
Pt had loose bm in brief and ambulated to bathroom to clean up and had more bm in commode.  Pt was able and willing to clean self, provided supplies and stand by assistance.  States he uses pullups at home for bowel incontinence

## 2022-03-06 NOTE — BH Assessment (Signed)
TTS contacted pt's wife Jayshaun Phillips [phone]) to provide update/plan of care. TTS informed pt's wife that pt has been psychiatrically cleared. Pt's wife asked to speak to pt's RN and/or EDP. Wife was instructed to call [phone].

## 2022-03-06 NOTE — ED Notes (Signed)
This RN and [employer] took patient to decon shower and cleaned him up as he was covered in feces. Placed in clean gown and brief, bed was cleaned as well.

## 2022-03-06 NOTE — ED Notes (Signed)
Pt's wife Vanessa Alesi updated Transition of care. Said his primary Dr. Burnett Sheng wants to speak with pt's current attending at [phone].

## 2022-03-06 NOTE — ED Notes (Signed)
Pt wife called stating pt may accept admission into previous rehab facility if it is an option.

## 2022-03-06 NOTE — ED Provider Notes (Addendum)
[facility] Provider Note    Event Date/Time   First MD Initiated Contact with Patient 03/06/22 0115     (approximate)   History   Fall   HPI  James Sanford is a 66 y.o. male with history of alcohol use disorder, diabetes, hypertension, paroxysmal atrial fibrillation on anticoagulation, sleep apnea, and depression who presents with a fall off the toilet.  The patient states that he took some of his medications including medication he is on for gastrointestinal issues that makes him somewhat lightheaded.  He then felt lightheaded and fell off the toilet.  He did not lose consciousness.  He states he did not hit his head.  He states he no longer feels lightheaded and is at his baseline now he reports chronic blurred vision due to cataracts and chronic diarrhea which is unchanged today.  He denies any chest pain or difficulty breathing.  He has no fever, vomiting, abdominal pain, or urinary symptoms.  He does state that he drank some alcohol tonight although does not specify how much.  He denies any acute mental health issues.  He denies SI or HI.  I reviewed the past medical records.  The patient was most recently admitted in November 2022 and per the hospitalist discharge summary from 12-22 he presented with generalized weakness and had herpes zoster ophthalmicus on IV acyclovir and UTI treated with Levaquin.   Physical Exam   Triage Vital Signs: ED Triage Vitals  Enc Vitals Group     BP 03/05/22 1948 111/68     Pulse Rate 03/05/22 1948 (!) 52     Resp 03/05/22 1948 18     Temp 03/05/22 1948 97.8 F (36.6 C)     Temp Source 03/05/22 1948 Oral     SpO2 03/05/22 1948 96 %     Weight --      Height --      Head Circumference --      Peak Flow --      Pain Score 03/05/22 1948 0     Pain Loc --      Pain Edu? --      Excl. in GC? --     Most recent vital signs: Vitals:   03/06/22 0145 03/06/22 0155  BP:  118/73  Pulse: 60 (!) 48  Resp: 16  14  Temp:  97.7 F (36.5 C)  SpO2: 99% 100%     General: Alert and oriented, no acute distress. CV:  Good peripheral perfusion.  Resp:  Normal effort.  Abd:  Soft and nontender.  No distention.  Other:  EOMI.  PERRLA.  No facial droop.  Motor intact in all extremities.  Normal coordination.  Moist mucous membranes.  No midline cervical spinal tenderness.   ED Results / Procedures / Treatments   Labs (all labs ordered are listed, but only abnormal results are displayed) Labs Reviewed  CBC WITH DIFFERENTIAL/PLATELET - Abnormal; Notable for the following components:      Result Value   WBC 3.8 (*)    RBC 3.70 (*)    Hemoglobin 9.9 (*)    HCT 32.7 (*)    RDW 18.0 (*)    All other components within normal limits  COMPREHENSIVE METABOLIC PANEL - Abnormal; Notable for the following components:   Potassium 2.8 (*)    Chloride 114 (*)    Calcium 8.1 (*)    All other components within normal limits     EKG  ED ECG REPORT  IArta Silence, the attending physician, personally viewed and interpreted this ECG.  Date: 03/05/2022 EKG Time: 1953 Rate: 47 Rhythm: Atrial fibrillation QRS Axis: normal Intervals: RBBB, LAFB ST/T Wave abnormalities: Nonspecific ST abnormalities Narrative Interpretation: no evidence of acute ischemia; no significant change when compared to EKG from 03/03/2021    RADIOLOGY  CT head: I independently viewed and interpreted the images; there is no ICH.  Radiology report indicates no acute abnormality.  PROCEDURES:  Critical Care performed: No  Procedures   MEDICATIONS ORDERED IN ED: Medications  potassium chloride SA (KLOR-CON M) CR tablet 20 mEq (20 mEq Oral Given 03/06/22 0244)     IMPRESSION / MDM / ASSESSMENT AND PLAN / ED COURSE  I reviewed the triage vital signs and the nursing notes.  66 year old male with PMH as noted above presents after a fall off the toilet associated with an episode of lightheadedness.  He states that this  has happened multiple times before after taking some of his medications, and he also endorses some alcohol use tonight.  Currently he is asymptomatic.  Physical exam is unremarkable.  He is slightly bradycardic but with otherwise normal vital signs.  Neurologic exam is nonfocal.  There is no significant trauma.  CT head shows no acute abnormality.  Basic labs are unremarkable except for mild hypokalemia.  The patient is anemic but this appears chronic.  He has no leukocytosis.  Differential diagnosis includes, but is not limited to, medication side effects, vasovagal near syncope, dehydration/hypovolemia, alcohol intoxication (although currently after over 6 hours in the ED he is clinically sober).  The patient no chest pain or difficulty breathing.  I do not suspect cardiac etiology.  EKG shows no acute changes.  I did sitter whether the patient may require admission for further monitoring, however he reports episodes of lightheadedness like this multiple times previously, is asymptomatic at this time, and has a reassuring work-up.  He would like to go home.  At this time he is stable for discharge.  Patient's presentation is most consistent with acute presentation with potential threat to life or bodily function.  I will give a dose of p.o. potassium here and prescribe a course of potassium for home.  I instructed him to follow-up with his primary care doctor.  I gave strict return precautions and he expressed understanding.  ----------------------------------------- 3:07 AM on 03/06/2022 -----------------------------------------  The RN spoke to the patient's wife Sydell Axon who mentioned that she was surprised to hear from [place] during the night and that the patient had previously been intoxicated, was expressing suicidal ideation which has been ongoing for some time, and that she expected that things would be "taken care of" in the hospital.  I went to reassess the the patient.  At this time he is  alert and oriented.  He adamantly denies suicidal ideation or homicidal ideation.  He states affirmatively "I want to live."  He does admit to being intoxicated earlier.  The patient was most recently seen by neurology in September.  Per the note from Dr. 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  I called the patient's wife and spoke to her myself.  She mentioned wanting to go to the magistrate later today to possibly commit the patient based on the episode last night.  At this time based on the patient's work-up there is no indication for medical admission.  Since he is calm and cooperative and  denying SI there is no indication for involuntary commitment at this time but I will obtain psychiatry consultation to help determine a safe disposition.  ----------------------------------------- 6:49 AM on 03/06/2022 -----------------------------------------  Psychiatry consult is pending.  I will sign the patient out to the oncoming ED provider at 7 AM.   FINAL CLINICAL IMPRESSION(S) / ED DIAGNOSES   Final diagnoses:  Fall, initial encounter  Near syncope  Hypokalemia     Rx / DC Orders   ED Discharge Orders          Ordered    potassium chloride SA (KLOR-CON M20) 20 MEQ tablet  2 times daily        03/06/22 [id number]             Note:  This document was prepared using Dragon voice recognition software and may include unintentional dictation errors.    Arta Silence, MD 03/06/22 [id number]    Arta Silence, MD 03/06/22 [phone]

## 2022-03-07 NOTE — ED Notes (Signed)
This RN spoke with pt's wife and stated again that pt has been cleared by psych, cleared by EDP, and ready for DC. Pt alert and cooperative. Pt's wife states that pt has ruined bathroom at home, pt is a known alcoholic and she has contacted a Clinical research associate. Pt's wife requesting that ED hold pt until Monday. Pt's wife informed that ED has discharged pt and he needs to be picked up. Pt's wife states that she is not in town. Pt's wife asked if a friend or family member could come get pt. Pt's wife asked this RN to find a way to keep pt until tomorrow, this RN stated that I am unable to hold pt after he has been discharged.

## 2022-03-07 NOTE — ED Notes (Signed)
Esig pad not working, pt acknowledges receipt of discharge paperwork and Hotel manager.

## 2022-03-07 NOTE — ED Notes (Signed)
Secretary attempted to call SW on call, no answer. Pt states he wants to go home. Pt compliant and cooperative at this time.

## 2022-03-07 NOTE — Consult Note (Signed)
[facility] Face-to-Face Psychiatry Consult   Reason for Consult:  capacity Referring Physician:  EDP Patient Identification: James Sanford MRN:  [id number] Principal Diagnosis: Alcohol abuse with alcohol-induced mood disorder (HCC) Diagnosis:  Principal Problem:   Alcohol abuse with alcohol-induced mood disorder (HCC)   Total Time spent with patient: 45 minutes  Subjective:   James Sanford is a 66 y.o. male patient admitted with alcohol abuse.  Today, the client was assessed for capacity and he was able to answer all the questions appropriately, including calling 911 if he fell and could not get up.  No withdrawal symptoms.  He does have capacity to make medical decisions at this time and desires to return home.  James Sanford stated he walked with his walker with PT with no issues which the team witnessed.  He is currently in a hallway bed and stated, "I got get out of here."  HPI on 12/2:  66 yo male presented to the ED after falling off the toilet and hitting his head, reports he "drank a little".  He denies suicidal ideations on assessment and continues to deny suicidal ideations today.  No psychosis, homicidal ideations, or withdrawal symptoms.  When he was discharged yesterday, the wife reported suicidal ideations along with concerns that she or he can no longer care for him.   Today, she reports his dementia is an issue as well along with an increase in his drinking.  No signs of detox, blood pressure WDL and pulse is 48.  The client is friendly and cooperative with no desire to explore rehab as "I have my AA group".  He denies any psychiatric concerns, psych cleared.  Past Psychiatric History: anxiety, alcohol use d/o  Risk to Self:  none Risk to Others:  none Prior Inpatient Therapy:  none Prior Outpatient Therapy:  none  Past Medical History:  Past Medical History:  Diagnosis Date   A-fib (HCC)    Anemia    Arthritis    Hyperlipemia    Hypertension    Memory  deficit    Pre-diabetes    Sleep apnea     Past Surgical History:  Procedure Laterality Date   COLONOSCOPY N/A 12/29/2021   Procedure: COLONOSCOPY;  Surgeon: Regis Bill, MD;  Location: [facility] ENDOSCOPY;  Service: Endoscopy;  Laterality: N/A;   COLONOSCOPY WITH PROPOFOL N/A 01/28/2020   Procedure: COLONOSCOPY WITH PROPOFOL;  Surgeon: Toney Reil, MD;  Location: [facility] ENDOSCOPY;  Service: Gastroenterology;  Laterality: N/A;   COLONOSCOPY WITH PROPOFOL N/A 01/28/2020   Procedure: COLONOSCOPY WITH PROPOFOL;  Surgeon: Toney Reil, MD;  Location: [facility] ENDOSCOPY;  Service: Gastroenterology;  Laterality: N/A;   ESOPHAGOGASTRODUODENOSCOPY N/A 12/29/2021   Procedure: ESOPHAGOGASTRODUODENOSCOPY (EGD);  Surgeon: Regis Bill, MD;  Location: [facility] ENDOSCOPY;  Service: Endoscopy;  Laterality: N/A;   ESOPHAGOGASTRODUODENOSCOPY (EGD) WITH PROPOFOL N/A 01/28/2020   Procedure: ESOPHAGOGASTRODUODENOSCOPY (EGD) WITH PROPOFOL;  Surgeon: Toney Reil, MD;  Location: [facility] ENDOSCOPY;  Service: Gastroenterology;  Laterality: N/A;   GASTRIC BYPASS     HERNIA REPAIR     INTRAMEDULLARY (IM) NAIL INTERTROCHANTERIC Left 11/25/2020   Procedure: INTRAMEDULLARY (IM) NAIL INTERTROCHANTRIC;  Surgeon: Lyndle Herrlich, MD;  Location: [facility] ORS;  Service: Orthopedics;  Laterality: Left;   JOINT REPLACEMENT Bilateral    Knee surgery   OPEN REDUCTION INTERNAL FIXATION (ORIF) DISTAL RADIAL FRACTURE Right 10/30/2017   Procedure: OPEN REDUCTION INTERNAL FIXATION (ORIF) DISTAL RADIAL FRACTURE;  Surgeon: Garnette Gunner, MD;  Location: [facility] ORS;  Service: Orthopedics;  Laterality: Right;   ORIF FEMUR FRACTURE Right 01/29/2020   Procedure: OPEN REDUCTION INTERNAL FIXATION (ORIF) DISTAL FEMUR FRACTURE;  Surgeon: Christena Flake, MD;  Location: [facility] ORS;  Service: Orthopedics;  Laterality: Right;   Family History:  Family History  Problem Relation Age of Onset   Stroke Mother    Suicidality Father     Family Psychiatric  History: see above Social History:  Social History   Substance and Sexual Activity  Alcohol Use Yes   Comment: beer occasionally     Social History   Substance and Sexual Activity  Drug Use Not Currently    Social History   Socioeconomic History   Marital status: Married    Spouse name: Not on file   Number of children: Not on file   Years of education: Not on file   Highest education level: Not on file  Occupational History   Not on file  Tobacco Use   Smoking status: Never   Smokeless tobacco: Former    Types: Chew   Tobacco comments:    quir about 4 years ago  Vaping Use   Vaping Use: Never used  Substance and Sexual Activity   Alcohol use: Yes    Comment: beer occasionally   Drug use: Not Currently   Sexual activity: Not on file  Other Topics Concern   Not on file  Social History Narrative   Lives at home with his wife. Independent baseline   Social Determinants of Corporate investment banker Strain: Not on file  Food Insecurity: Not on file  Transportation Needs: Not on file  Physical Activity: Not on file  Stress: Not on file  Social Connections: Not on file   Additional Social History:    Allergies:   Allergies  Allergen Reactions   Penicillins Diarrhea    TOLERATED CEFAZOLIN 11/25/20 Unknown childhood reaction    Labs:  Results for orders placed or performed during the hospital encounter of 03/06/22 (from the past 48 hour(s))  CBC with Differential     Status: Abnormal   Collection Time: 03/05/22 10:31 PM  Result Value Ref Range   WBC 3.8 (L) 4.0 - 10.5 K/uL   RBC 3.70 (L) 4.22 - 5.81 MIL/uL   Hemoglobin 9.9 (L) 13.0 - 17.0 g/dL   HCT 16.1 (L) 09.6 - 04.5 %   MCV 88.4 80.0 - 100.0 fL   MCH 26.8 26.0 - 34.0 pg   MCHC 30.3 30.0 - 36.0 g/dL   RDW 40.9 (H) 81.1 - 91.4 %   Platelets 160 150 - 400 K/uL   nRBC 0.0 0.0 - 0.2 %   Neutrophils Relative % 61 %   Neutro Abs 2.4 1.7 - 7.7 K/uL   Lymphocytes Relative 30 %    Lymphs Abs 1.2 0.7 - 4.0 K/uL   Monocytes Relative 7 %   Monocytes Absolute 0.3 0.1 - 1.0 K/uL   Eosinophils Relative 1 %   Eosinophils Absolute 0.0 0.0 - 0.5 K/uL   Basophils Relative 1 %   Basophils Absolute 0.0 0.0 - 0.1 K/uL   Immature Granulocytes 0 %   Abs Immature Granulocytes 0.01 0.00 - 0.07 K/uL    Comment: Performed at [facility], [address] [place], [place] [address]  Comprehensive metabolic panel     Status: Abnormal   Collection Time: 03/05/22 10:31 PM  Result Value Ref Range   Sodium 145 135 - 145 mmol/L   Potassium 2.8 (L) 3.5 - 5.1 mmol/L  Chloride 114 (H) 98 - 111 mmol/L   CO2 23 22 - 32 mmol/L   Glucose, Bld 95 70 - 99 mg/dL    Comment: Glucose reference range applies only to samples taken after fasting for at least 8 hours.   BUN 17 8 - 23 mg/dL   Creatinine, Ser 6.19 0.61 - 1.24 mg/dL   Calcium 8.1 (L) 8.9 - 10.3 mg/dL   Total Protein 6.7 6.5 - 8.1 g/dL   Albumin 3.5 3.5 - 5.0 g/dL   AST 29 15 - 41 U/L   ALT 23 0 - 44 U/L   Alkaline Phosphatase 106 38 - 126 U/L   Total Bilirubin 0.9 0.3 - 1.2 mg/dL   GFR, Estimated >50 >93 mL/min    Comment: (NOTE) Calculated using the CKD-EPI Creatinine Equation (2021)    Anion gap 8 5 - 15    Comment: Performed at [facility], [address], [place], [place] [address]  Magnesium     Status: None   Collection Time: 03/05/22 10:31 PM  Result Value Ref Range   Magnesium 2.0 1.7 - 2.4 mg/dL    Comment: Performed at [facility], [address], [place], [place] [address]    Current Facility-Administered Medications  Medication Dose Route Frequency Provider Last Rate Last Admin   apixaban (ELIQUIS) tablet 5 mg  5 mg Oral BID Chesley Noon, MD   5 mg at 03/07/22 0931   calcium-vitamin D (OSCAL WITH D) 500-5 MG-MCG per tablet 1 tablet  1 tablet Oral Daily Concha Se, MD   1 tablet at 03/07/22 0931   cholecalciferol (VITAMIN D3) 25 MCG (1000 UNIT) tablet 1,000 Units  1,000  Units Oral q morning Concha Se, MD   1,000 Units at 03/07/22 0931   cholestyramine Lanetta Inch) packet 1 packet  1 packet Oral Daily Concha Se, MD   1 packet at 03/07/22 0931   ferrous sulfate tablet 325 mg  325 mg Oral QHS Concha Se, MD   325 mg at 03/06/22 2107   gatifloxacin (ZYMAXID) 0.5 % ophthalmic drops 1 drop  1 drop Both Eyes QID Concha Se, MD       lipase/protease/amylase (CREON) capsule 24,000 Units  24,000 Units Oral TID Concha Se, MD   24,000 Units at 03/07/22 0931   prednisoLONE acetate (PRED FORTE) 1 % ophthalmic suspension 1 drop  1 drop Both Eyes QID Concha Se, MD   1 drop at 03/06/22 2112   thiamine (VITAMIN B1) tablet 100 mg  100 mg Oral TID Concha Se, MD   100 mg at 03/07/22 9833   Vitamin D (Ergocalciferol) (DRISDOL) 1.25 MG (50000 UNIT) capsule 50,000 Units  50,000 Units Oral Weekly Concha Se, MD       Current Outpatient Medications  Medication Sig Dispense Refill   CALTRATE 600+D3 SOFT 600-20 MG-MCG CHEW Chew 1 tablet by mouth daily.     cholestyramine (QUESTRAN) 4 g packet Take 1 packet by mouth daily.     CREON 24000-76000 units CPEP Take 2 capsules by mouth 3 (three) times daily.     D3-1000 25 MCG (1000 UT) capsule Take 1,000 Units by mouth every morning.     ELIQUIS 5 MG TABS tablet Take 5 mg by mouth 2 (two) times daily.     ferrous sulfate 325 (65 FE) MG tablet Take 1 tablet (325 mg total) by mouth every other day. 30 tablet 3   ILEVRO 0.3 % ophthalmic suspension  Place 1 drop into the right eye daily.     IRON SLOW RELEASE 143 (45 Fe) MG TBCR Take 1 tablet by mouth at bedtime.     metroNIDAZOLE (FLAGYL) 250 MG tablet Take 250 mg by mouth 3 (three) times daily.     Pancrelipase, Lip-Prot-Amyl, 24000-76000 units CPEP Take 3 capsules by mouth in the morning, at noon, in the evening, and at bedtime.     potassium chloride SA (KLOR-CON M20) 20 MEQ tablet Take 1 tablet (20 mEq total) by mouth 2 (two) times daily for 7 days. 14 tablet 0    prednisoLONE acetate (PRED FORTE) 1 % ophthalmic suspension Place 1 drop into the right eye 4 (four) times daily.     thiamine (VITAMIN B1) 100 MG tablet Take 100 mg by mouth 3 (three) times daily.     VIGAMOX 0.5 % ophthalmic solution Apply 1 drop to eye 4 (four) times daily.     Vitamin D, Ergocalciferol, (DRISDOL) 1.25 MG (50000 UNIT) CAPS capsule Take 50,000 Units by mouth once a week.     colestipol (COLESTID) 1 g tablet Take 2 g by mouth 2 (two) times daily. (Patient not taking: Reported on 03/06/2022)     escitalopram (LEXAPRO) 10 MG tablet Take 1 tablet by mouth daily. (Patient not taking: Reported on 12/29/2021)     FLUoxetine (PROZAC) 20 MG capsule Take 1 capsule (20 mg total) by mouth daily. (Patient not taking: Reported on 12/29/2021) 30 capsule 3   metoprolol succinate (TOPROL-XL) 50 MG 24 hr tablet Take 0.5 tablets (25 mg total) by mouth daily. (Patient not taking: Reported on 12/29/2021) 30 tablet 0   Saccharomyces boulardii (PROBIOTIC) 250 MG CAPS Take 1 capsule by mouth in the morning and at bedtime. (Patient not taking: Reported on 02/12/2022) 30 capsule 0   traZODone (DESYREL) 50 MG tablet Take 50 mg by mouth at bedtime. (Patient not taking: Reported on 03/06/2022)      Musculoskeletal: Strength & Muscle Tone: within normal limits Gait & Station: normal Patient leans: N/A  Psychiatric Specialty Exam: Physical Exam Vitals and nursing note reviewed.  Constitutional:      Appearance: Normal appearance.  HENT:     Head: Normocephalic.     Nose: Nose normal.  Cardiovascular:     Pulses: Normal pulses.  Musculoskeletal:        General: Normal range of motion.     Cervical back: Normal range of motion.  Neurological:     General: No focal deficit present.     Mental Status: He is alert and oriented to person, place, and time.  Psychiatric:        Attention and Perception: Attention and perception normal.        Mood and Affect: Mood is anxious.        Speech: Speech normal.         Behavior: Behavior normal. Behavior is cooperative.        Thought Content: Thought content normal.        Cognition and Memory: Cognition normal. Memory is impaired.        Judgment: Judgment normal.     Review of Systems  Psychiatric/Behavioral:  Positive for substance abuse. The patient is nervous/anxious.   All other systems reviewed and are negative.   Blood pressure (!) 147/109, pulse 72, temperature 97.7 F (36.5 C), temperature source Oral, resp. rate 18, height 5\' 11"  (1.803 m), weight 75.5 kg, SpO2 100 %.Body mass index is 23.21 kg/m.  General Appearance:  Casual  Eye Contact:  Good  Speech:  Normal Rate  Volume:  Normal  Mood:  Anxious  Affect:  Congruent  Thought Process:  Coherent  Orientation:  Full (Time, Place, and Person)  Thought Content:  WDL and Logical  Suicidal Thoughts:  No  Homicidal Thoughts:  No  Memory:  Immediate;   Fair Recent;   Fair Remote;   Fair  Judgement:  Fair  Insight:  Fair  Psychomotor Activity:  Normal  Concentration:  Concentration: Good and Attention Span: Good  Recall:  Good  Fund of Knowledge:  Good  Language:  Good  Akathisia:  No  Handed:  Right  AIMS (if indicated):     Assets:  Leisure Time Physical Health Resilience Social Support  ADL's:  Intact  Cognition:  WNL  Sleep:        Physical Exam: Physical Exam Vitals and nursing note reviewed.  Constitutional:      Appearance: Normal appearance.  HENT:     Head: Normocephalic.     Nose: Nose normal.  Cardiovascular:     Pulses: Normal pulses.  Musculoskeletal:        General: Normal range of motion.     Cervical back: Normal range of motion.  Neurological:     General: No focal deficit present.     Mental Status: He is alert and oriented to person, place, and time.  Psychiatric:        Attention and Perception: Attention and perception normal.        Mood and Affect: Mood is anxious.        Speech: Speech normal.        Behavior: Behavior normal.  Behavior is cooperative.        Thought Content: Thought content normal.        Cognition and Memory: Cognition normal. Memory is impaired.        Judgment: Judgment normal.    Review of Systems  Psychiatric/Behavioral:  Positive for substance abuse. The patient is nervous/anxious.   All other systems reviewed and are negative.  Blood pressure (!) 147/109, pulse 72, temperature 97.7 F (36.5 C), temperature source Oral, resp. rate 18, height 5\' 11"  (1.803 m), weight 75.5 kg, SpO2 100 %. Body mass index is 23.21 kg/m.  Treatment Plan Summary: Alcohol abuse with alcohol-induced mood disorder: Follow up with AA  Disposition: No evidence of imminent risk to self or others at present.   Patient does not meet criteria for psychiatric inpatient admission. Supportive therapy provided about ongoing stressors.  , NP 03/07/2022 12:33 PM

## 2022-03-07 NOTE — ED Notes (Signed)
This RN attempted to call pt's wife for discharge, no answer.

## 2022-03-07 NOTE — ED Notes (Signed)
Pt wheeled to lobby to wait for family. Pt states he wants to go home. Pt given all belongings.

## 2022-03-07 NOTE — TOC Initial Note (Signed)
Transition of Care [facility]) - Initial/Assessment Note    Patient Details  Name: James Sanford MRN: [id number] Date of Birth: 05-Jun-1955  [facility] CM/SW Contact:    Carmina Miller, LCSWA Phone Number: 03/07/2022, 3:49 PM  Clinical Narrative:                  CSW received phone call from [facility] requesting substance use resources, resources placed on the AVS, RN made aware.         Patient Goals and CMS Choice        Expected Discharge Plan and Services                                                Prior Living Arrangements/Services                       Activities of Daily Living      Permission Sought/Granted                  Emotional Assessment              Admission diagnosis:  Fall; EMS Patient Active Problem List   Diagnosis Date Noted   Alcohol abuse with alcohol-induced mood disorder (HCC) 03/06/2022   Dehydration    AKI (acute kidney injury) (HCC) 03/04/2021   Herpes zoster ophthalmicus of right eye 03/03/2021   Alcohol use disorder, moderate, dependence (HCC) 03/03/2021   Lactic acidosis 03/03/2021   Iron deficiency anemia    Hypokalemia    Diarrhea    Impaired fasting glucose    Malnutrition of moderate degree 12/02/2020   Protein-calorie malnutrition, severe 11/25/2020   Closed nondisplaced intertrochanteric fracture of left femur (HCC) 11/24/2020   Acute lower UTI 11/24/2020   Hyperlipemia    Acute GI bleeding 01/25/2020   Diabetes mellitus type 2, uncomplicated (HCC) 01/25/2020   Hyperlipidemia 01/25/2020   Obesity 01/25/2020   Essential hypertension 01/25/2020   Sleep apnea 01/25/2020   Alcohol abuse 03/07/2019   Cardiomyopathy, idiopathic (HCC) 03/07/2019   AF (paroxysmal atrial fibrillation) (HCC) 11/07/2017   Radius fracture 10/29/2017   PCP:  Jerl Mina, MD Pharmacy:   [id number] [place], [place] - [address] AT [facility] [address] [place] [address] Phone: [phone] Fax: [phone]  [facility] [id number] [place], [place] - [address] [place] [address] Phone: [phone] Fax: [phone]     Social Determinants of Health (SDOH) Interventions    Readmission Risk Interventions     No data to display

## 2022-03-07 NOTE — ED Provider Notes (Signed)
-----------------------------------------   4:28 AM on 03/07/2022 -----------------------------------------   Blood pressure (!) 157/93, pulse 62, temperature 98.1 F (36.7 C), resp. rate 15, height 5\' 11"  (1.803 m), weight 75.5 kg, SpO2 97 %.  The patient is calm and cooperative at this time.  There have been no acute events since the last update.  Awaiting disposition plan from case management/social work.    Lynley Killilea, , DO 03/07/22 [phone]

## 2022-03-07 NOTE — ED Notes (Addendum)
Per wife, pt needs alcohol rehab, as pt falls when he is intoxicated and it is an ongoing problem. Pt's wife states that she has not heard from home health about support when pt is home. Pt's wife told that ED does not do ETOH rehab and he has been cleared medically and by psych to be discharged.

## 2022-03-07 NOTE — ED Notes (Signed)
Pt dressed in paper scrubs as pt clothes are dirty. Pt belongings brought to bedside.

## 2022-03-07 NOTE — ED Notes (Signed)
AVS reprinted for pt with resources for pt and family. Pt alert, cooperative. Pt encouraged to seek help for alcohol issues. Pt states he will. Pt to be picked up by wife. Pt wheeled to lobby.

## 2022-03-07 NOTE — Progress Notes (Signed)
PT Screen Note  Patient Details Name: James Sanford MRN: [id number] DOB: [birth year]   Cancelled Treatment:    Reason Eval/Treat Not Completed: PT screened, no needs identified, will sign off Pt was able to do all bed mobility and transfers w/o assist or hesitation.  Was able to quickly and confidently circumambulate the entire ED (>300 ft) w/o AD and apart from some impulsivity (to walk quickly and show he can go home) he had no LOBs or overt safety issues.  Pt at his baseline; not interested, or needing, further PT f/u.  PT will sign off.    Malachi Pro, DPT 03/07/2022, 11:58 AM

## 2022-03-07 NOTE — ED Notes (Signed)
Hospital meal provided, pt tolerated w/o complaints.  Waste discarded appropriately.  

## 2022-03-07 NOTE — ED Notes (Signed)
Attempted to call pt's wife, no answer, left message.

## 2022-03-07 NOTE — ED Notes (Signed)
Pt's wife called about pt discharge, pt's wife states she cannot take care of him at this time. Pt wife requesting placement for pt, states pt's primary MD thinks pt should not go home. Pt's wife states that pt has frequent falls and self neglect. Pt alert and oriented at this time, ambulating in hall, steady gait, feeding self.

## 2022-03-07 NOTE — ED Provider Notes (Signed)
Patient has been evaluated by PT and is ambulated about the ER.  Does not have any PT needs.  Patient has been psych cleared he has been calm cooperative since being here.  There was some concern about possible dementia but patient does not have underlying diagnosis of this.  Blood work appears stable he is tolerating p.o.  States he does not want to be kept in the ER anymore.  Now patient will reevaluate by psychiatry felt to have capacity and still with no psychiatric needs.  I do not see any indication for hospitalization at this time.  Attempted to contact patient's PCP per their request from yesterday without answer.  Patient does appear to stable for discharge.   Willy Eddy, MD 03/07/22 1133

## 2022-03-07 NOTE — ED Notes (Signed)
This RN called and spoke with patients wife. RN explained that pt had been cleared by EDP, Psychiatric provider, and PT and that he was ready for discharge. RN informed pt wife that social work is including community resources in AVS for them for patients alcohol dependence. RN offer to arrange transport home via taxis cab or we could discharge him to the lobby to wait for her. Pts wife stated that she was currently on her way to the ED to pick patient up.

## 2022-03-07 NOTE — ED Notes (Addendum)
Breakfast meal tray given at this time. States that he is ready to go to home and want to speak to his wife. Assisted pt with the phone to call his wife. Pt irritable at this time stating that he wants to go home, pt concerned about his surgery coming up and states that he needs his medication. Explained to pt that this RN will have medication for him and that we were waiting on SW to see him.

## 2022-03-07 NOTE — ED Notes (Signed)
PT at bedside at this time 

## 2022-03-07 NOTE — ED Notes (Signed)
This RN called pt's daughter and spoke with her about pt dc, daughter to call pt's wife to return call to ED.

## 2022-03-09 ENCOUNTER — Other Ambulatory Visit: Payer: Self-pay

## 2022-03-09 ENCOUNTER — Ambulatory Visit: Payer: BC Managed Care – PPO | Admitting: Anesthesiology

## 2022-03-09 ENCOUNTER — Ambulatory Visit
Admission: RE | Admit: 2022-03-09 | Discharge: 2022-03-09 | Disposition: A | Discharge status: Home / Self Care | Payer: BC Managed Care – PPO | Attending: Ophthalmology | Admitting: Ophthalmology

## 2022-03-09 ENCOUNTER — Encounter: Payer: Self-pay | Admitting: Ophthalmology

## 2022-03-09 ENCOUNTER — Encounter
Admission: RE | Disposition: A | Discharge status: Home / Self Care | Payer: Self-pay | Source: Home / Self Care | Attending: Ophthalmology

## 2022-03-09 DIAGNOSIS — Z9884 Bariatric surgery status: Secondary | ICD-10-CM | POA: Insufficient documentation

## 2022-03-09 DIAGNOSIS — I4891 Unspecified atrial fibrillation: Secondary | ICD-10-CM | POA: Diagnosis not present

## 2022-03-09 DIAGNOSIS — E1136 Type 2 diabetes mellitus with diabetic cataract: Secondary | ICD-10-CM | POA: Diagnosis present

## 2022-03-09 DIAGNOSIS — N179 Acute kidney failure, unspecified: Secondary | ICD-10-CM

## 2022-03-09 DIAGNOSIS — Z7901 Long term (current) use of anticoagulants: Secondary | ICD-10-CM | POA: Insufficient documentation

## 2022-03-09 DIAGNOSIS — I110 Hypertensive heart disease with heart failure: Secondary | ICD-10-CM | POA: Insufficient documentation

## 2022-03-09 DIAGNOSIS — B0230 Zoster ocular disease, unspecified: Secondary | ICD-10-CM

## 2022-03-09 DIAGNOSIS — D649 Anemia, unspecified: Secondary | ICD-10-CM | POA: Insufficient documentation

## 2022-03-09 DIAGNOSIS — G4730 Sleep apnea, unspecified: Secondary | ICD-10-CM | POA: Insufficient documentation

## 2022-03-09 DIAGNOSIS — I509 Heart failure, unspecified: Secondary | ICD-10-CM | POA: Insufficient documentation

## 2022-03-09 DIAGNOSIS — H2511 Age-related nuclear cataract, right eye: Secondary | ICD-10-CM | POA: Insufficient documentation

## 2022-03-09 DIAGNOSIS — F32A Depression, unspecified: Secondary | ICD-10-CM | POA: Insufficient documentation

## 2022-03-09 HISTORY — DX: Other amnesia: R41.3

## 2022-03-09 HISTORY — PX: CATARACT EXTRACTION W/PHACO: SHX586

## 2022-03-09 HISTORY — DX: Anemia, unspecified: D64.9

## 2022-03-09 HISTORY — DX: Unspecified atrial fibrillation: I48.91

## 2022-03-09 SURGERY — PHACOEMULSIFICATION, CATARACT, WITH IOL INSERTION
Anesthesia: Monitor Anesthesia Care | Site: Eye | Laterality: Right

## 2022-03-09 MED ORDER — BRIMONIDINE TARTRATE-TIMOLOL 0.2-0.5 % OP SOLN
OPHTHALMIC | Status: DC | PRN
  Administered 2022-03-09: 1 [drp] via OPHTHALMIC

## 2022-03-09 MED ORDER — ARMC OPHTHALMIC DILATING DROPS
1.0000 | OPHTHALMIC | Status: AC | PRN
  Administered 2022-03-09 (×3): 1 via OPHTHALMIC

## 2022-03-09 MED ORDER — TETRACAINE HCL 0.5 % OP SOLN
1.0000 [drp] | OPHTHALMIC | Status: AC | PRN
  Administered 2022-03-09 (×3): 1 [drp] via OPHTHALMIC

## 2022-03-09 MED ORDER — LIDOCAINE HCL (PF) 2 % IJ SOLN
INTRAOCULAR | Status: DC | PRN
  Administered 2022-03-09: 4 mL via INTRAOCULAR

## 2022-03-09 MED ORDER — MIDAZOLAM HCL 2 MG/2ML IJ SOLN
INTRAMUSCULAR | Status: DC | PRN
  Administered 2022-03-09: 1 mg via INTRAVENOUS

## 2022-03-09 MED ORDER — FENTANYL CITRATE (PF) 100 MCG/2ML IJ SOLN
INTRAMUSCULAR | Status: DC | PRN
  Administered 2022-03-09: 50 ug via INTRAVENOUS

## 2022-03-09 MED ORDER — MOXIFLOXACIN HCL 0.5 % OP SOLN
OPHTHALMIC | Status: DC | PRN
  Administered 2022-03-09: .2 mL via OPHTHALMIC

## 2022-03-09 MED ORDER — SIGHTPATH DOSE#1 BSS IO SOLN
INTRAOCULAR | Status: DC | PRN
  Administered 2022-03-09: 87 mL via OPHTHALMIC

## 2022-03-09 MED ORDER — SIGHTPATH DOSE#1 BSS IO SOLN
INTRAOCULAR | Status: DC | PRN
  Administered 2022-03-09: 15 mL via INTRAOCULAR

## 2022-03-09 MED ORDER — LACTATED RINGERS IV SOLN: INTRAVENOUS | Status: DC

## 2022-03-09 MED ORDER — SIGHTPATH DOSE#1 NA HYALUR & NA CHOND-NA HYALUR IO KIT
PACK | INTRAOCULAR | Status: DC | PRN
  Administered 2022-03-09: 1 via OPHTHALMIC

## 2022-03-09 MED ORDER — GLYCOPYRROLATE 0.2 MG/ML IJ SOLN
INTRAMUSCULAR | Status: DC | PRN
  Administered 2022-03-09: .1 mg via INTRAVENOUS

## 2022-03-09 SURGICAL SUPPLY — 22 items
CANNULA ANT/CHMB 27G (MISCELLANEOUS) IMPLANT
CANNULA ANT/CHMB 27GA (MISCELLANEOUS) IMPLANT
CATARACT SUITE SIGHTPATH (MISCELLANEOUS) ×1 IMPLANT
DISSECTOR HYDRO NUCLEUS 50X22 (MISCELLANEOUS) ×1 IMPLANT
DRSG TEGADERM 2-3/8X2-3/4 SM (GAUZE/BANDAGES/DRESSINGS) ×1 IMPLANT
FEE CATARACT SUITE SIGHTPATH (MISCELLANEOUS) ×1 IMPLANT
GLOVE SURG SYN 7.5  E (GLOVE) ×1
GLOVE SURG SYN 7.5 E (GLOVE) ×1 IMPLANT
GLOVE SURG SYN 7.5 PF PI (GLOVE) ×1 IMPLANT
GLOVE SURG SYN 8.5  E (GLOVE) ×1
GLOVE SURG SYN 8.5 E (GLOVE) ×1 IMPLANT
GLOVE SURG SYN 8.5 PF PI (GLOVE) ×1 IMPLANT
LENS IOL TECNIS EYHANCE 17.5 (Intraocular Lens) IMPLANT
NDL FILTER BLUNT 18X1 1/2 (NEEDLE) IMPLANT
NEEDLE FILTER BLUNT 18X1 1/2 (NEEDLE) IMPLANT
PACK VIT ANT 23G (MISCELLANEOUS) IMPLANT
RING MALYGIN (MISCELLANEOUS) IMPLANT
SUT ETHILON 10-0 CS-B-6CS-B-6 (SUTURE)
SUTURE EHLN 10-0 CS-B-6CS-B-6 (SUTURE) IMPLANT
SYR 3ML LL SCALE MARK (SYRINGE) IMPLANT
SYR 5ML LL (SYRINGE) IMPLANT
WATER STERILE IRR 250ML POUR (IV SOLUTION) ×1 IMPLANT

## 2022-03-09 NOTE — H&P (Signed)
[facility]   Primary Care Physician:  Jerl Mina, MD Ophthalmologist: Dr. Deberah Pelton  Pre-Procedure History & Physical: HPI:  James Sanford is a 66 y.o. male here for cataract surgery.   Past Medical History:  Diagnosis Date   A-fib (HCC)    Anemia    Arthritis    Hyperlipemia    Hypertension    Memory deficit    Pre-diabetes    Sleep apnea     Past Surgical History:  Procedure Laterality Date   COLONOSCOPY N/A 12/29/2021   Procedure: COLONOSCOPY;  Surgeon: Regis Bill, MD;  Location: [facility] ENDOSCOPY;  Service: Endoscopy;  Laterality: N/A;   COLONOSCOPY WITH PROPOFOL N/A 01/28/2020   Procedure: COLONOSCOPY WITH PROPOFOL;  Surgeon: Toney Reil, MD;  Location: [facility] ENDOSCOPY;  Service: Gastroenterology;  Laterality: N/A;   COLONOSCOPY WITH PROPOFOL N/A 01/28/2020   Procedure: COLONOSCOPY WITH PROPOFOL;  Surgeon: Toney Reil, MD;  Location: [facility] ENDOSCOPY;  Service: Gastroenterology;  Laterality: N/A;   ESOPHAGOGASTRODUODENOSCOPY N/A 12/29/2021   Procedure: ESOPHAGOGASTRODUODENOSCOPY (EGD);  Surgeon: Regis Bill, MD;  Location: [facility] ENDOSCOPY;  Service: Endoscopy;  Laterality: N/A;   ESOPHAGOGASTRODUODENOSCOPY (EGD) WITH PROPOFOL N/A 01/28/2020   Procedure: ESOPHAGOGASTRODUODENOSCOPY (EGD) WITH PROPOFOL;  Surgeon: Toney Reil, MD;  Location: [facility] ENDOSCOPY;  Service: Gastroenterology;  Laterality: N/A;   GASTRIC BYPASS     HERNIA REPAIR     INTRAMEDULLARY (IM) NAIL INTERTROCHANTERIC Left 11/25/2020   Procedure: INTRAMEDULLARY (IM) NAIL INTERTROCHANTRIC;  Surgeon: Lyndle Herrlich, MD;  Location: [facility] ORS;  Service: Orthopedics;  Laterality: Left;   JOINT REPLACEMENT Bilateral    Knee surgery   OPEN REDUCTION INTERNAL FIXATION (ORIF) DISTAL RADIAL FRACTURE Right 10/30/2017   Procedure: OPEN REDUCTION INTERNAL FIXATION (ORIF) DISTAL RADIAL FRACTURE;  Surgeon: Garnette Gunner, MD;  Location: [facility] ORS;  Service:  Orthopedics;  Laterality: Right;   ORIF FEMUR FRACTURE Right 01/29/2020   Procedure: OPEN REDUCTION INTERNAL FIXATION (ORIF) DISTAL FEMUR FRACTURE;  Surgeon: Christena Flake, MD;  Location: [facility] ORS;  Service: Orthopedics;  Laterality: Right;    Prior to Admission medications   Medication Sig Start Date End Date Taking? Authorizing Provider  CALTRATE 600+D3 SOFT 600-20 MG-MCG CHEW Chew 1 tablet by mouth daily. 10/31/21  Yes [provider]  cholestyramine (QUESTRAN) 4 g packet Take 1 packet by mouth daily. 01/14/22  Yes [provider]  CREON 24000-76000 units CPEP Take 2 capsules by mouth 3 (three) times daily. 02/10/21  Yes [provider]  D3-1000 25 MCG (1000 UT) capsule Take 1,000 Units by mouth every morning. 11/04/21  Yes [provider]  ELIQUIS 5 MG TABS tablet Take 5 mg by mouth 2 (two) times daily. 01/05/20  Yes [provider]  ferrous sulfate 325 (65 FE) MG tablet Take 1 tablet (325 mg total) by mouth every other day. 12/27/20  Yes Cipriano Bunker, MD  IRON SLOW RELEASE 143 (45 Fe) MG TBCR Take 1 tablet by mouth at bedtime. 11/04/21  Yes [provider]  thiamine (VITAMIN B1) 100 MG tablet Take 100 mg by mouth 3 (three) times daily. 10/18/21  Yes [provider]  colestipol (COLESTID) 1 g tablet Take 2 g by mouth 2 (two) times daily. Patient not taking: Reported on 03/06/2022    [provider]  escitalopram (LEXAPRO) 10 MG tablet Take 1 tablet by mouth daily. Patient not taking: Reported on 12/29/2021    [provider]  FLUoxetine (PROZAC) 20 MG capsule Take 1 capsule (20  mg total) by mouth daily. Patient not taking: Reported on 12/29/2021 12/27/20   Cipriano Bunker, MD  ILEVRO 0.3 % ophthalmic suspension Place 1 drop into the right eye daily. 02/03/22   [provider]  metoprolol succinate (TOPROL-XL) 50 MG 24 hr tablet Take 0.5 tablets (25 mg total) by mouth daily. Patient not taking: Reported on  12/29/2021 02/06/20   Enedina Finner, MD  metroNIDAZOLE (FLAGYL) 250 MG tablet Take 250 mg by mouth 3 (three) times daily. 02/12/22   [provider]  Pancrelipase, Lip-Prot-Amyl, 24000-76000 units CPEP Take 3 capsules by mouth in the morning, at noon, in the evening, and at bedtime. 12/15/21 02/09/23  [provider]  potassium chloride SA (KLOR-CON M20) 20 MEQ tablet Take 1 tablet (20 mEq total) by mouth 2 (two) times daily for 7 days. 03/06/22 03/13/22  Dionne Bucy, MD  prednisoLONE acetate (PRED FORTE) 1 % ophthalmic suspension Place 1 drop into the right eye 4 (four) times daily. 01/26/22   [provider]  Saccharomyces boulardii (PROBIOTIC) 250 MG CAPS Take 1 capsule by mouth in the morning and at bedtime. Patient not taking: Reported on 02/12/2022 11/24/20   Willy Eddy, MD  traZODone (DESYREL) 50 MG tablet Take 50 mg by mouth at bedtime. Patient not taking: Reported on 03/06/2022 10/29/21   [provider]  VIGAMOX 0.5 % ophthalmic solution Apply 1 drop to eye 4 (four) times daily. 02/02/22   [provider]  Vitamin D, Ergocalciferol, (DRISDOL) 1.25 MG (50000 UNIT) CAPS capsule Take 50,000 Units by mouth once a week. 03/05/22   [provider]    Allergies as of 02/01/2022 - Review Complete 12/29/2021  Allergen Reaction Noted   Penicillins Diarrhea 01/25/2020    Family History  Problem Relation Age of Onset   Stroke Mother    Suicidality Father     Social History   Socioeconomic History   Marital status: Married    Spouse name: Not on file   Number of children: Not on file   Years of education: Not on file   Highest education level: Not on file  Occupational History   Not on file  Tobacco Use   Smoking status: Never   Smokeless tobacco: Former    Types: Chew   Tobacco comments:    quir about 4 years ago  Vaping Use   Vaping Use: Never used  Substance and Sexual Activity   Alcohol use: Yes    Comment: beer  occasionally   Drug use: Not Currently   Sexual activity: Not on file  Other Topics Concern   Not on file  Social History Narrative   Lives at home with his wife. Independent baseline   Social Determinants of Corporate investment banker Strain: Not on file  Food Insecurity: Not on file  Transportation Needs: Not on file  Physical Activity: Not on file  Stress: Not on file  Social Connections: Not on file  Intimate Partner Violence: Not on file    Review of Systems: See HPI, otherwise negative ROS  Physical Exam: BP (!) 147/91   Temp (!) 97 F (36.1 C) (Tympanic)   Resp 13   Ht 5' 10.87" (1.8 m)   Wt 75.6 kg   SpO2 99%   BMI 23.34 kg/m  General:   Alert, cooperative in NAD Head:  Normocephalic and atraumatic. Respiratory:  Normal work of breathing. Cardiovascular:  RRR  Impression/Plan: James Sanford is here for cataract surgery.  Risks, benefits, limitations, and alternatives  regarding cataract surgery have been reviewed with the patient.  Questions have been answered.  All parties agreeable.   Estanislado Pandy, MD  03/09/2022, 12:19 PM

## 2022-03-09 NOTE — Op Note (Signed)
OPERATIVE NOTE  James Sanford [facility] [id number] 03/09/2022   PREOPERATIVE DIAGNOSIS: Nuclear sclerotic cataract right eye. H25.11   POSTOPERATIVE DIAGNOSIS: Nuclear sclerotic cataract right eye. H25.11   PROCEDURE:  Phacoemusification with posterior chamber intraocular lens placement of the right eye  Ultrasound time: Procedure(s): CATARACT EXTRACTION PHACO AND INTRAOCULAR LENS PLACEMENT (IOC) RIGHT DIABETIC 8.98 01:09.1 (Right)  LENS:   Implant Name Type Inv. Item Serial No. Manufacturer Lot No. LRB No. Used Action  LENS IOL TECNIS EYHANCE 17.5 - [id number] Intraocular Lens LENS IOL TECNIS EYHANCE 17.5 [id number] [employer]  Right 1 Implanted      SURGEON:  Julious Payer. Rolley Sims, MD   ANESTHESIA:  Topical with tetracaine drops, augmented with 1% preservative-free intracameral lidocaine.   COMPLICATIONS:  None.   DESCRIPTION OF PROCEDURE:  The patient was identified in the holding room and transported to the operating room and placed in the supine position under the operating microscope.  The right eye was identified as the operative eye, which was prepped and draped in the usual sterile ophthalmic fashion.   A 1 millimeter clear-corneal paracentesis was made superotemporally. Preservative-free 1% lidocaine mixed with 1:1,000 bisulfite-free aqueous solution of epinephrine was injected into the anterior chamber. The anterior chamber was then filled with Viscoat viscoelastic. Nasal posterior synechiae were easily lysed with the viscoelastic cannula. A 2.4 millimeter keratome was used to make a clear-corneal incision inferotemporally. A curvilinear capsulorrhexis was made with a cystotome and capsulorrhexis forceps. Balanced salt solution was used to hydrodissect and hydrodelineate the nucleus. Phacoemulsification was then used to remove the lens nucleus and epinucleus. The remaining cortex was then removed using the irrigation and aspiration handpiece. Provisc was then placed into the capsular  bag to distend it for lens placement. A +17.50 D DIB00 intraocular lens was then injected into the capsular bag. The remaining viscoelastic was aspirated.   Wounds were hydrated with balanced salt solution.  The anterior chamber was inflated to a physiologic pressure with balanced salt solution.  No wound leaks were noted. Vigamox was injected intracamerally.  Timolol and Brimonidine drops were applied to the eye.  The patient was taken to the recovery room in stable condition without complications of anesthesia or surgery.  James Sanford [place] 03/09/2022, 1:02 PM

## 2022-03-09 NOTE — Transfer of Care (Signed)
Immediate Anesthesia Transfer of Care Note  Patient: James Sanford  Procedure(s) Performed: CATARACT EXTRACTION PHACO AND INTRAOCULAR LENS PLACEMENT (IOC) RIGHT DIABETIC 8.98 01:09.1 (Right: Eye)  Patient Location: PACU  Anesthesia Type: MAC  Level of Consciousness: awake, alert  and patient cooperative  Airway and Oxygen Therapy: Patient Spontanous Breathing and Patient connected to supplemental oxygen  Post-op Assessment: Post-op Vital signs reviewed, Patient's Cardiovascular Status Stable, Respiratory Function Stable, Patent Airway and No signs of Nausea or vomiting  Post-op Vital Signs: Reviewed and stable  Complications: No notable events documented.

## 2022-03-09 NOTE — Anesthesia Postprocedure Evaluation (Signed)
Anesthesia Post Note  Patient: Kaylob Wallen [place]  Procedure(s) Performed: CATARACT EXTRACTION PHACO AND INTRAOCULAR LENS PLACEMENT (IOC) RIGHT DIABETIC 8.98 01:09.1 (Right: Eye)  Patient location during evaluation: PACU Anesthesia Type: MAC Level of consciousness: awake and alert Pain management: pain level controlled Vital Signs Assessment: post-procedure vital signs reviewed and stable Respiratory status: spontaneous breathing, nonlabored ventilation, respiratory function stable and patient connected to nasal cannula oxygen Cardiovascular status: blood pressure returned to baseline and stable Postop Assessment: no apparent nausea or vomiting Anesthetic complications: no   No notable events documented.   Last Vitals:  Vitals:   03/09/22 1306 03/09/22 1314  BP: 124/82 (!) 127/94  Pulse: (!) 33 (!) 55  Resp: 10 14  Temp: (!) 36.1 C (!) 36.1 C  SpO2: 98% 98%    Last Pain:  Vitals:   03/09/22 1314  TempSrc:   PainSc: 0-No pain                 Ilene Qua

## 2022-03-10 ENCOUNTER — Encounter: Payer: Self-pay | Admitting: Ophthalmology

## 2022-04-06 ENCOUNTER — Ambulatory Visit: Payer: BC Managed Care – PPO | Admitting: General Practice

## 2022-04-06 ENCOUNTER — Encounter
Admission: RE | Disposition: A | Discharge status: Home / Self Care | Payer: Self-pay | Source: Home / Self Care | Attending: Gastroenterology

## 2022-04-06 ENCOUNTER — Encounter: Payer: Self-pay | Admitting: *Deleted

## 2022-04-06 ENCOUNTER — Ambulatory Visit
Admission: RE | Admit: 2022-04-06 | Discharge: 2022-04-06 | Disposition: A | Discharge status: Home / Self Care | Payer: BC Managed Care – PPO | Attending: Gastroenterology | Admitting: Gastroenterology

## 2022-04-06 DIAGNOSIS — Z7901 Long term (current) use of anticoagulants: Secondary | ICD-10-CM | POA: Insufficient documentation

## 2022-04-06 DIAGNOSIS — F1010 Alcohol abuse, uncomplicated: Secondary | ICD-10-CM | POA: Diagnosis not present

## 2022-04-06 DIAGNOSIS — I4510 Unspecified right bundle-branch block: Secondary | ICD-10-CM | POA: Insufficient documentation

## 2022-04-06 DIAGNOSIS — G4730 Sleep apnea, unspecified: Secondary | ICD-10-CM | POA: Insufficient documentation

## 2022-04-06 DIAGNOSIS — F32A Depression, unspecified: Secondary | ICD-10-CM | POA: Diagnosis not present

## 2022-04-06 DIAGNOSIS — K529 Noninfective gastroenteritis and colitis, unspecified: Secondary | ICD-10-CM | POA: Insufficient documentation

## 2022-04-06 DIAGNOSIS — R7303 Prediabetes: Secondary | ICD-10-CM | POA: Diagnosis not present

## 2022-04-06 DIAGNOSIS — I110 Hypertensive heart disease with heart failure: Secondary | ICD-10-CM | POA: Diagnosis not present

## 2022-04-06 DIAGNOSIS — D649 Anemia, unspecified: Secondary | ICD-10-CM | POA: Diagnosis not present

## 2022-04-06 DIAGNOSIS — I4891 Unspecified atrial fibrillation: Secondary | ICD-10-CM | POA: Diagnosis not present

## 2022-04-06 DIAGNOSIS — Z9884 Bariatric surgery status: Secondary | ICD-10-CM | POA: Insufficient documentation

## 2022-04-06 DIAGNOSIS — I509 Heart failure, unspecified: Secondary | ICD-10-CM | POA: Insufficient documentation

## 2022-04-06 DIAGNOSIS — I452 Bifascicular block: Secondary | ICD-10-CM | POA: Diagnosis not present

## 2022-04-06 DIAGNOSIS — M1990 Unspecified osteoarthritis, unspecified site: Secondary | ICD-10-CM | POA: Diagnosis not present

## 2022-04-06 HISTORY — PX: FLEXIBLE SIGMOIDOSCOPY: SHX5431

## 2022-04-06 LAB — GLUCOSE, CAPILLARY
Glucose-Capillary: 60 mg/dL — ABNORMAL LOW (ref 70–99)
Glucose-Capillary: 83 mg/dL (ref 70–99)

## 2022-04-06 SURGERY — SIGMOIDOSCOPY, FLEXIBLE

## 2022-04-06 MED ORDER — PROPOFOL 10 MG/ML IV BOLUS
INTRAVENOUS | Status: AC
  Filled 2022-04-06: qty 20

## 2022-04-06 MED ORDER — LIDOCAINE HCL (CARDIAC) PF 100 MG/5ML IV SOSY
PREFILLED_SYRINGE | INTRAVENOUS | Status: DC | PRN
  Administered 2022-04-06: 30 mg via INTRAVENOUS

## 2022-04-06 MED ORDER — DEXTROSE 50 % IV SOLN
INTRAVENOUS | Status: AC
  Filled 2022-04-06: qty 50

## 2022-04-06 MED ORDER — MIDAZOLAM HCL 2 MG/2ML IJ SOLN
INTRAMUSCULAR | Status: AC
  Filled 2022-04-06: qty 2

## 2022-04-06 MED ORDER — DEXMEDETOMIDINE HCL IN NACL 80 MCG/20ML IV SOLN
INTRAVENOUS | Status: DC | PRN
  Administered 2022-04-06: 4 ug via BUCCAL

## 2022-04-06 MED ORDER — PROPOFOL 10 MG/ML IV BOLUS
INTRAVENOUS | Status: DC | PRN
  Administered 2022-04-06: 30 mg via INTRAVENOUS

## 2022-04-06 MED ORDER — PROPOFOL 500 MG/50ML IV EMUL
INTRAVENOUS | Status: DC | PRN
  Administered 2022-04-06: 25 ug/kg/min via INTRAVENOUS

## 2022-04-06 MED ORDER — DEXTROSE 50 % IV SOLN
12.5000 g | Freq: Once | INTRAVENOUS | Status: AC
  Administered 2022-04-06: 12.5 g via INTRAVENOUS

## 2022-04-06 MED ORDER — SODIUM CHLORIDE 0.9 % IV SOLN: INTRAVENOUS | Status: DC

## 2022-04-06 MED ORDER — LIDOCAINE HCL (PF) 2 % IJ SOLN
INTRAMUSCULAR | Status: AC
  Filled 2022-04-06: qty 5

## 2022-04-06 MED ORDER — MIDAZOLAM HCL 2 MG/2ML IJ SOLN
INTRAMUSCULAR | Status: DC | PRN
  Administered 2022-04-06: 2 mg via INTRAVENOUS

## 2022-04-06 NOTE — Transfer of Care (Signed)
Immediate Anesthesia Transfer of Care Note  Patient: James Sanford  Procedure(s) Performed: FLEXIBLE SIGMOIDOSCOPY  Patient Location: PACU and Endoscopy Unit  Anesthesia Type:MAC  Level of Consciousness: sedated  Airway & Oxygen Therapy: Patient Spontanous Breathing  Post-op Assessment: Report given to RN, Post -op Vital signs reviewed and stable, and Patient moving all extremities  Post vital signs: Reviewed and stable  Last Vitals:  Vitals Value Taken Time  BP 113/73 04/06/22 1141  Temp 35.7 C 04/06/22 1140  Pulse 60 04/06/22 1140  Resp 15 04/06/22 1148  SpO2 100 % 04/06/22 1140  Vitals shown include unvalidated device data.  Last Pain:  Vitals:   04/06/22 1140  TempSrc: Temporal  PainSc: Asleep         Complications: No notable events documented.

## 2022-04-06 NOTE — Op Note (Signed)
[facility] Gastroenterology Patient Name: James Sanford Procedure Date: 04/06/2022 10:46 AM MRN: [id number] Account #: [id number] Date of Birth: 1955/12/13 Admit Type: Outpatient Age: 67 Room: [facility] ENDO ROOM 1 Gender: Male Note Status: Finalized Instrument Name: Peds Colonoscope [id number] Procedure:             Flexible Sigmoidoscopy Indications:           Chronic diarrhea Providers:             Andrey Farmer MD, MD Referring MD:          Andrey Farmer MD, MD (Referring MD), Irven Easterly.                         Kary Kos, MD (Referring MD) Medicines:             Monitored Anesthesia Care Complications:         No immediate complications. Estimated blood loss:                         Minimal. Procedure:             Pre-Anesthesia Assessment:                        - Prior to the procedure, a History and Physical was                         performed, and patient medications and allergies were                         reviewed. The patient is competent. The risks and                         benefits of the procedure and the sedation options and                         risks were discussed with the patient. All questions                         were answered and informed consent was obtained.                         Patient identification and proposed procedure were                         verified by the physician, the nurse, the                         anesthesiologist, the anesthetist and the technician                         in the endoscopy suite. Mental Status Examination:                         alert and oriented. Airway Examination: normal                         oropharyngeal airway and neck mobility. Respiratory  Examination: clear to auscultation. CV Examination:                         normal. Prophylactic Antibiotics: The patient does not                         require prophylactic antibiotics. Prior                          Anticoagulants: The patient has taken Eliquis                         (apixaban), last dose was 1 day prior to procedure.                         ASA Grade Assessment: III - A patient with severe                         systemic disease. After reviewing the risks and                         benefits, the patient was deemed in satisfactory                         condition to undergo the procedure. The anesthesia                         plan was to use monitored anesthesia care (MAC).                         Immediately prior to administration of medications,                         the patient was re-assessed for adequacy to receive                         sedatives. The heart rate, respiratory rate, oxygen                         saturations, blood pressure, adequacy of pulmonary                         ventilation, and response to care were monitored                         throughout the procedure. The physical status of the                         patient was re-assessed after the procedure.                        After obtaining informed consent, the scope was passed                         under direct vision. The Colonoscope was introduced                         through the anus and advanced to the the sigmoid  colon. The flexible sigmoidoscopy was accomplished                         without difficulty. The patient tolerated the                         procedure well. The quality of the bowel preparation                         was poor. Findings:      The perianal and digital rectal examinations were normal.      A large amount of stool was found in the recto-sigmoid colon and in the       sigmoid colon, making visualization difficult.      Normal mucosa was found in the sigmoid colon. Biopsies for histology       were taken with a cold forceps from the sigmoid colon for evaluation of       microscopic colitis. Estimated blood loss was minimal. Impression:             - Preparation of the colon was poor.                        - Stool in the recto-sigmoid colon and in the sigmoid                         colon.                        - Normal mucosa in the sigmoid colon. Biopsied. Recommendation:        - Discharge patient to home.                        - Resume previous diet.                        - Await pathology results.                        - Resume Eliquis (apixaban) at prior dose today.                        - Return to referring physician as previously                         scheduled. Procedure Code(s):     --- Professional ---                        [phone], Sigmoidoscopy, flexible; with biopsy, single or                         multiple Diagnosis Code(s):     --- Professional ---                        K52.9, Noninfective gastroenteritis and colitis,                         unspecified CPT copyright 2022 American Medical Association. All rights reserved. The codes documented in this report are preliminary and upon coder review may  be revised to  meet current compliance requirements. Andrey Farmer MD, MD 04/06/2022 11:35:03 AM Number of Addenda: 0 Note Initiated On: 04/06/2022 10:46 AM Total Procedure Duration: 0 hours 4 minutes 37 seconds  Estimated Blood Loss:  Estimated blood loss was minimal.      [facility]

## 2022-04-06 NOTE — Anesthesia Preprocedure Evaluation (Addendum)
Anesthesia Evaluation  Patient identified by MRN, date of birth, ID band Patient awake    Reviewed: Allergy & Precautions, NPO status , Patient's Chart, lab work & pertinent test results  History of Anesthesia Complications Negative for: history of anesthetic complications  Airway Mallampati: III   Neck ROM: Full    Dental  (+) Edentulous Upper, Edentulous Lower   Pulmonary sleep apnea    Pulmonary exam normal breath sounds clear to auscultation       Cardiovascular hypertension, +CHF  + dysrhythmias (a fib on Eliquis)  Rhythm:Irregular Rate:Normal  ECG 03/07/22:  Atrial fibrillation Right bundle branch block Left anterior fascicular block * Bifascicular block * Septal infarct , age undetermined  ECG 05/05/21:  NORMAL LEFT VENTRICULAR SYSTOLIC FUNCTION  WITH MODERATE LVH  NORMAL RIGHT VENTRICULAR SYSTOLIC FUNCTION  NO VALVULAR STENOSIS  TRIVIAL AR, PR  MILD TR, MR  EF 50%    Neuro/Psych  PSYCHIATRIC DISORDERS  Depression    Alcohol use disorder (pt denies)    GI/Hepatic S/p gastric sleeve   Endo/Other  diabetes, Type 2    Renal/GU negative Renal ROS     Musculoskeletal  (+) Arthritis ,    Abdominal   Peds  Hematology  (+) Blood dyscrasia, anemia   Anesthesia Other Findings   Reproductive/Obstetrics                             Anesthesia Physical Anesthesia Plan  ASA: 3  Anesthesia Plan: General   Post-op Pain Management:    Induction: Intravenous  PONV Risk Score and Plan: 2 and TIVA, Propofol infusion and Ondansetron  Airway Management Planned: Natural Airway  Additional Equipment:   Intra-op Plan:   Post-operative Plan:   Informed Consent: I have reviewed the patients History and Physical, chart, labs and discussed the procedure including the risks, benefits and alternatives for the proposed anesthesia with the patient or authorized representative who has  indicated his/her understanding and acceptance.     Dental Advisory Given  Plan Discussed with: CRNA  Anesthesia Plan Comments: (Patient consented for risks of anesthesia including but not limited to:  - adverse reactions to medications - damage to eyes, teeth, lips or other oral mucosa - nerve damage due to positioning  - sore throat or hoarseness - Damage to heart, brain, nerves, lungs, other parts of body or loss of life  Patient voiced understanding.)        Anesthesia Quick Evaluation

## 2022-04-06 NOTE — Anesthesia Postprocedure Evaluation (Signed)
Anesthesia Post Note  Patient: James Sanford  Procedure(s) Performed: [place]  Patient location during evaluation: PACU Anesthesia Type: General Level of consciousness: awake and alert Pain management: pain level controlled Vital Signs Assessment: post-procedure vital signs reviewed and stable Respiratory status: spontaneous breathing, nonlabored ventilation, respiratory function stable and patient connected to nasal cannula oxygen Cardiovascular status: blood pressure returned to baseline and stable Postop Assessment: no apparent nausea or vomiting Anesthetic complications: no  No notable events documented.   Last Vitals:  Vitals:   04/06/22 1143 04/06/22 1150  BP: 113/73 121/72  Pulse:  (!) 55  Resp: 16 15  Temp:    SpO2:  99%    Last Pain:  Vitals:   04/06/22 1150  TempSrc:   PainSc: 0-No pain                 Dimas Millin

## 2022-04-06 NOTE — Interval H&P Note (Signed)
History and Physical Interval Note:  04/06/2022 11:10 AM  James Sanford  has presented today for surgery, with the diagnosis of chronic diarrhea.  The various methods of treatment have been discussed with the patient and family. After consideration of risks, benefits and other options for treatment, the patient has consented to  Procedure(s): FLEXIBLE SIGMOIDOSCOPY (N/A) as a surgical intervention.  The patient's history has been reviewed, patient examined, no change in status, stable for surgery.  I have reviewed the patient's chart and labs.  Questions were answered to the patient's satisfaction.     [name]  Ok to proceed with flex sig

## 2022-04-06 NOTE — H&P (Signed)
Outpatient short stay form Pre-procedure 04/06/2022  [name], MD  Primary Physician: Maryland Pink, MD  Reason for visit:  Chronic diarrhea  History of present illness:    67 y/o gentleman with chronic alcohol abuse here for flex sig for chronic diarrhea. No biopsies done on last colonoscopy. History of sleeve gastrectomy and inguinal hernia repair. No family history of GI malignancies. Takes eliquis with last dose yesterday. Spoke with patient about bleeding risk but can perform biopsies on DOAC.    Current Facility-Administered Medications:    0.9 %  sodium chloride infusion, , Intravenous, Continuous, Caeleigh Prohaska, Hilton Cork, MD  Medications Prior to Admission  Medication Sig Dispense Refill Last Dose   cholestyramine (QUESTRAN) 4 g packet Take 1 packet by mouth daily.   Past Week   CREON 24000-76000 units CPEP Take 2 capsules by mouth 3 (three) times daily.   04/05/2022   ELIQUIS 5 MG TABS tablet Take 5 mg by mouth 2 (two) times daily.   04/05/2022   thiamine (VITAMIN B1) 100 MG tablet Take 100 mg by mouth 3 (three) times daily.   Past Month   CALTRATE 600+D3 SOFT 600-20 MG-MCG CHEW Chew 1 tablet by mouth daily.      colestipol (COLESTID) 1 g tablet Take 2 g by mouth 2 (two) times daily. (Patient not taking: Reported on 03/06/2022)      D3-1000 25 MCG (1000 UT) capsule Take 1,000 Units by mouth every morning.      escitalopram (LEXAPRO) 10 MG tablet Take 1 tablet by mouth daily. (Patient not taking: Reported on 12/29/2021)      ferrous sulfate 325 (65 FE) MG tablet Take 1 tablet (325 mg total) by mouth every other day. (Patient not taking: Reported on 04/06/2022) 30 tablet 3 Not Taking   FLUoxetine (PROZAC) 20 MG capsule Take 1 capsule (20 mg total) by mouth daily. (Patient not taking: Reported on 12/29/2021) 30 capsule 3    ILEVRO 0.3 % ophthalmic suspension Place 1 drop into the right eye daily.      IRON SLOW RELEASE 143 (45 Fe) MG TBCR Take 1 tablet by mouth at bedtime.       metoprolol succinate (TOPROL-XL) 50 MG 24 hr tablet Take 0.5 tablets (25 mg total) by mouth daily. (Patient not taking: Reported on 12/29/2021) 30 tablet 0    metroNIDAZOLE (FLAGYL) 250 MG tablet Take 250 mg by mouth 3 (three) times daily.      Pancrelipase, Lip-Prot-Amyl, 24000-76000 units CPEP Take 3 capsules by mouth in the morning, at noon, in the evening, and at bedtime.      potassium chloride SA (KLOR-CON M20) 20 MEQ tablet Take 1 tablet (20 mEq total) by mouth 2 (two) times daily for 7 days. 14 tablet 0    prednisoLONE acetate (PRED FORTE) 1 % ophthalmic suspension Place 1 drop into the right eye 4 (four) times daily.      Saccharomyces boulardii (PROBIOTIC) 250 MG CAPS Take 1 capsule by mouth in the morning and at bedtime. (Patient not taking: Reported on 02/12/2022) 30 capsule 0    traZODone (DESYREL) 50 MG tablet Take 50 mg by mouth at bedtime. (Patient not taking: Reported on 03/06/2022)      VIGAMOX 0.5 % ophthalmic solution Apply 1 drop to eye 4 (four) times daily.      Vitamin D, Ergocalciferol, (DRISDOL) 1.25 MG (50000 UNIT) CAPS capsule Take 50,000 Units by mouth once a week.        Allergies  Allergen Reactions  Penicillins Diarrhea    TOLERATED CEFAZOLIN 11/25/20 Unknown childhood reaction     Past Medical History:  Diagnosis Date   A-fib ([place])    Anemia    Arthritis    Hyperlipemia    Hypertension    Memory deficit    Pre-diabetes    Sleep apnea     Review of systems:  Otherwise negative.    Physical Exam  Gen: Alert, oriented. Appears stated age.  HEENT: PERRLA. Lungs: No respiratory distress CV: RRR Abd: soft, benign, no masses Ext: No edema    Planned procedures: Proceed with Flex sig. The patient understands the nature of the planned procedure, indications, risks, alternatives and potential complications including but not limited to bleeding, infection, perforation, damage to internal organs and possible oversedation/side effects from anesthesia.  The patient agrees and gives consent to proceed.  Please refer to procedure notes for findings, recommendations and patient disposition/instructions.     [name], MD [facility]

## 2022-04-07 ENCOUNTER — Encounter: Payer: Self-pay | Admitting: Gastroenterology

## 2022-04-07 LAB — SURGICAL PATHOLOGY

## 2022-08-02 ENCOUNTER — Emergency Department
Admission: EM | Admit: 2022-08-02 | Discharge: 2022-08-03 | Disposition: A | Discharge status: Other Acute Inpatient Hospital | Payer: BC Managed Care – PPO | Attending: Internal Medicine | Admitting: Internal Medicine

## 2022-08-02 DIAGNOSIS — S72492A Other fracture of lower end of left femur, initial encounter for closed fracture: Principal | ICD-10-CM

## 2022-08-02 DIAGNOSIS — S7290XA Unspecified fracture of unspecified femur, initial encounter for closed fracture: Secondary | ICD-10-CM | POA: Diagnosis not present

## 2022-08-02 DIAGNOSIS — F1010 Alcohol abuse, uncomplicated: Secondary | ICD-10-CM | POA: Insufficient documentation

## 2022-08-02 DIAGNOSIS — Y999 Unspecified external cause status: Secondary | ICD-10-CM | POA: Insufficient documentation

## 2022-08-02 DIAGNOSIS — S8782XA Crushing injury of left lower leg, initial encounter: Secondary | ICD-10-CM | POA: Diagnosis present

## 2022-08-02 DIAGNOSIS — W19XXXA Unspecified fall, initial encounter: Secondary | ICD-10-CM | POA: Insufficient documentation

## 2022-08-02 DIAGNOSIS — Y929 Unspecified place or not applicable: Secondary | ICD-10-CM | POA: Diagnosis not present

## 2022-08-02 DIAGNOSIS — I10 Essential (primary) hypertension: Secondary | ICD-10-CM | POA: Insufficient documentation

## 2022-08-02 DIAGNOSIS — Y939 Activity, unspecified: Secondary | ICD-10-CM | POA: Insufficient documentation

## 2022-08-02 DIAGNOSIS — E119 Type 2 diabetes mellitus without complications: Secondary | ICD-10-CM | POA: Insufficient documentation

## 2022-08-02 NOTE — ED Provider Notes (Signed)
[facility] Provider Note    Event Date/Time   First MD Initiated Contact with Patient 08/02/22 2354     (approximate)   History   Fall and Leg Injury   HPI  James Sanford is a 67 y.o. male who presents to the ED for evaluation of Fall and Leg Injury   I review a PCP visit from 4/24.  History of alcoholism, paroxysmal A-fib on Eliquis, HTN and DM.  He has had a left TKA and total hip in the past remotely  Patient presented to the ED from home via EMS for evaluation of a fall.  He reports going up the steps, slipped and accidentally fell and struck his left leg.  No head trauma or syncope.  Deformity to the left distal femur is noted.  No recent illnesses   Physical Exam   Triage Vital Signs: ED Triage Vitals  Enc Vitals Group     BP      Pulse      Resp      Temp      Temp src      SpO2      Weight      Height      Head Circumference      Peak Flow      Pain Score      Pain Loc      Pain Edu?      Excl. in GC?     Most recent vital signs: Vitals:   08/02/22 2357 08/03/22 0003  BP:  (!) 141/101  Pulse: 63   Resp: 18   Temp: (!) 97.2 F (36.2 C)   SpO2: 98%     General: Awake, no distress.  CV:  Good peripheral perfusion.  Resp:  Normal effort.  Abd:  No distention.  MSK:  Close deformity to the distal left femur just superior to a well-healed TKA incision.  Distally neurovascularly intact Neuro:  No focal deficits appreciated. Other:     ED Results / Procedures / Treatments   Labs (all labs ordered are listed, but only abnormal results are displayed) Labs Reviewed  CBC WITH DIFFERENTIAL/PLATELET  BASIC METABOLIC PANEL  PROTIME-INR  APTT  TYPE AND SCREEN    EKG   RADIOLOGY Plain film of the left femur and left knee interpreted by me with a distal femur fracture, comminuted  Official radiology report(s): DG Knee Complete 4 Views Left  Result Date: 08/03/2022 CLINICAL DATA:  Recent fall with left knee  pain, initial encounter EXAM: LEFT KNEE - COMPLETE 4+ VIEW COMPARISON:  None Available. FINDINGS: Left knee prosthesis is noted. Comminuted distal femoral fracture is noted at the diaphyseal metaphyseal junction. Mild impaction at the fracture site is seen. Joint effusion is noted. Mild posterior angulation of the distal fracture fragments is seen. IMPRESSION: Distal left femoral fracture with associated joint effusion. Electronically Signed   By: Alcide Clever M.D.   On: 08/03/2022 00:48   DG FEMUR MIN 2 VIEWS LEFT  Result Date: 08/03/2022 CLINICAL DATA:  Recent fall with left leg pain, initial encounter EXAM: LEFT FEMUR 2 VIEWS COMPARISON:  12/17/2020 FINDINGS: Proximal left medullary rod is noted with fixation screw traversing the femoral neck. Healed fracture is noted. In the distal femur however there is a comminuted fracture involving the diaphyseal metaphyseal junction with some impaction at the fracture site. Left knee replacement is noted. IMPRESSION: Comminuted distal femoral fracture as described. Electronically Signed   By: Eulah Pont.D.  On: 08/03/2022 00:47    PROCEDURES and INTERVENTIONS:  Procedures  Medications - No data to display   IMPRESSION / MDM / ASSESSMENT AND PLAN / ED COURSE  I reviewed the triage vital signs and the nursing notes.  Differential diagnosis includes, but is not limited to, hematoma, fracture, dislocation  {Patient presents with symptoms of an acute illness or injury that is potentially life-threatening.  Pleasant 67 year old male presents to the ED after a fall with a complicated left femur fracture requiring transfer for orthopedic fixation.  No signs of additional trauma beyond the left distal femur.  X-rays confirm comminuted fracture with extensions to the left TKA.  I consult with orthopedics at [facility], here as well as a hospitalist at [facility].  Will transfer.  Will send preoperative screening labs  Clinical Course as of 08/03/22 0208   Tue Aug 03, 2022  0981 I consult with ortho, Dr. Allena Katz.  He reviewed images and recommends transfer to [facility] for Ortho trauma evaluation [DS]  0100 I discussed with Dr. Jena Gauss, who recommends transfer for hospitalist admission and considerations for orthopedic fixation [DS]  0113 Reassessed and provided updates.  Discussed my recommendation for transfer.  He is agreeable.  Requests I call his wife [DS]  0126 I call his wife, [name].  No response and I left a HIPAA compliant voicemail [DS]    Clinical Course User Index [DS] Delton Prairie, MD     FINAL CLINICAL IMPRESSION(S) / ED DIAGNOSES   Final diagnoses:  Closed comminuted intra-articular fracture of distal femur, left, initial encounter [facility])  Fall, initial encounter     Rx / DC Orders   ED Discharge Orders     None        Note:  This document was prepared using Dragon voice recognition software and may include unintentional dictation errors.   Delton Prairie, MD 08/03/22 0130

## 2022-08-02 NOTE — ED Triage Notes (Signed)
Arrives to [facility] via [facility] for fall at home resulting in LLE injury.  Per patient he was walking up stairs when he lost balance and fell.  Patient denies striking head or loss consciousness.  Currently on elequis for A-fib.  Obvious deformity to left distal femur/knee.  Patient denies any other injuries. Hx of total knee replacement.

## 2022-08-03 ENCOUNTER — Encounter (HOSPITAL_COMMUNITY): Payer: Self-pay

## 2022-08-03 ENCOUNTER — Encounter: Payer: Self-pay | Admitting: Radiology

## 2022-08-03 ENCOUNTER — Inpatient Hospital Stay (HOSPITAL_COMMUNITY)
Admission: AD | Admit: 2022-08-03 | Discharge: 2022-08-11 | DRG: 481 | Disposition: A | Discharge status: Skilled Nursing Facility | Payer: BC Managed Care – PPO | Source: Other Acute Inpatient Hospital | Attending: Internal Medicine | Admitting: Internal Medicine

## 2022-08-03 ENCOUNTER — Emergency Department: Payer: BC Managed Care – PPO

## 2022-08-03 DIAGNOSIS — W010XXA Fall on same level from slipping, tripping and stumbling without subsequent striking against object, initial encounter: Secondary | ICD-10-CM | POA: Diagnosis present

## 2022-08-03 DIAGNOSIS — Z961 Presence of intraocular lens: Secondary | ICD-10-CM | POA: Diagnosis present

## 2022-08-03 DIAGNOSIS — S7290XA Unspecified fracture of unspecified femur, initial encounter for closed fracture: Secondary | ICD-10-CM | POA: Diagnosis not present

## 2022-08-03 DIAGNOSIS — K760 Fatty (change of) liver, not elsewhere classified: Secondary | ICD-10-CM | POA: Diagnosis present

## 2022-08-03 DIAGNOSIS — Z9841 Cataract extraction status, right eye: Secondary | ICD-10-CM | POA: Diagnosis not present

## 2022-08-03 DIAGNOSIS — G4733 Obstructive sleep apnea (adult) (pediatric): Secondary | ICD-10-CM | POA: Diagnosis present

## 2022-08-03 DIAGNOSIS — Z79899 Other long term (current) drug therapy: Secondary | ICD-10-CM

## 2022-08-03 DIAGNOSIS — Z7901 Long term (current) use of anticoagulants: Secondary | ICD-10-CM | POA: Diagnosis not present

## 2022-08-03 DIAGNOSIS — E119 Type 2 diabetes mellitus without complications: Secondary | ICD-10-CM

## 2022-08-03 DIAGNOSIS — Z880 Allergy status to penicillin: Secondary | ICD-10-CM

## 2022-08-03 DIAGNOSIS — Z96649 Presence of unspecified artificial hip joint: Secondary | ICD-10-CM | POA: Diagnosis not present

## 2022-08-03 DIAGNOSIS — D62 Acute posthemorrhagic anemia: Secondary | ICD-10-CM | POA: Diagnosis not present

## 2022-08-03 DIAGNOSIS — K638219 Small intestinal bacterial overgrowth, unspecified: Secondary | ICD-10-CM | POA: Diagnosis present

## 2022-08-03 DIAGNOSIS — Z96653 Presence of artificial knee joint, bilateral: Secondary | ICD-10-CM | POA: Diagnosis present

## 2022-08-03 DIAGNOSIS — K8689 Other specified diseases of pancreas: Secondary | ICD-10-CM | POA: Diagnosis present

## 2022-08-03 DIAGNOSIS — Z87891 Personal history of nicotine dependence: Secondary | ICD-10-CM | POA: Diagnosis not present

## 2022-08-03 DIAGNOSIS — S72402A Unspecified fracture of lower end of left femur, initial encounter for closed fracture: Principal | ICD-10-CM | POA: Diagnosis present

## 2022-08-03 DIAGNOSIS — I110 Hypertensive heart disease with heart failure: Secondary | ICD-10-CM | POA: Diagnosis not present

## 2022-08-03 DIAGNOSIS — E785 Hyperlipidemia, unspecified: Secondary | ICD-10-CM | POA: Diagnosis present

## 2022-08-03 DIAGNOSIS — Z7409 Other reduced mobility: Secondary | ICD-10-CM | POA: Diagnosis present

## 2022-08-03 DIAGNOSIS — I10 Essential (primary) hypertension: Secondary | ICD-10-CM | POA: Diagnosis present

## 2022-08-03 DIAGNOSIS — M978XXA Periprosthetic fracture around other internal prosthetic joint, initial encounter: Secondary | ICD-10-CM | POA: Diagnosis not present

## 2022-08-03 DIAGNOSIS — M9712XA Periprosthetic fracture around internal prosthetic left knee joint, initial encounter: Secondary | ICD-10-CM | POA: Diagnosis present

## 2022-08-03 DIAGNOSIS — G4730 Sleep apnea, unspecified: Secondary | ICD-10-CM | POA: Diagnosis present

## 2022-08-03 DIAGNOSIS — D509 Iron deficiency anemia, unspecified: Secondary | ICD-10-CM | POA: Diagnosis present

## 2022-08-03 DIAGNOSIS — F1020 Alcohol dependence, uncomplicated: Secondary | ICD-10-CM | POA: Diagnosis present

## 2022-08-03 DIAGNOSIS — I4891 Unspecified atrial fibrillation: Secondary | ICD-10-CM | POA: Diagnosis present

## 2022-08-03 DIAGNOSIS — I509 Heart failure, unspecified: Secondary | ICD-10-CM | POA: Diagnosis not present

## 2022-08-03 DIAGNOSIS — Z9884 Bariatric surgery status: Secondary | ICD-10-CM | POA: Diagnosis not present

## 2022-08-03 LAB — CBC WITH DIFFERENTIAL/PLATELET
Abs Immature Granulocytes: 0.04 10*3/uL (ref 0.00–0.07)
Basophils Absolute: 0 10*3/uL (ref 0.0–0.1)
Basophils Relative: 0 %
Eosinophils Absolute: 0.1 10*3/uL (ref 0.0–0.5)
Eosinophils Relative: 1 %
HCT: 34.8 % — ABNORMAL LOW (ref 39.0–52.0)
Hemoglobin: 10.6 g/dL — ABNORMAL LOW (ref 13.0–17.0)
Immature Granulocytes: 1 %
Lymphocytes Relative: 9 %
Lymphs Abs: 0.7 10*3/uL (ref 0.7–4.0)
MCH: 27.6 pg (ref 26.0–34.0)
MCHC: 30.5 g/dL (ref 30.0–36.0)
MCV: 90.6 fL (ref 80.0–100.0)
Monocytes Absolute: 0.8 10*3/uL (ref 0.1–1.0)
Monocytes Relative: 10 %
Neutro Abs: 6.2 10*3/uL (ref 1.7–7.7)
Neutrophils Relative %: 79 %
Platelets: 148 10*3/uL — ABNORMAL LOW (ref 150–400)
RBC: 3.84 MIL/uL — ABNORMAL LOW (ref 4.22–5.81)
RDW: 17 % — ABNORMAL HIGH (ref 11.5–15.5)
WBC: 7.9 10*3/uL (ref 4.0–10.5)
nRBC: 0 % (ref 0.0–0.2)

## 2022-08-03 LAB — BASIC METABOLIC PANEL
Anion gap: 8 (ref 5–15)
BUN: 23 mg/dL (ref 8–23)
CO2: 24 mmol/L (ref 22–32)
Calcium: 8.2 mg/dL — ABNORMAL LOW (ref 8.9–10.3)
Chloride: 111 mmol/L (ref 98–111)
Creatinine, Ser: 0.71 mg/dL (ref 0.61–1.24)
GFR, Estimated: >60 mL/min (ref >60)
Glucose, Bld: 109 mg/dL — ABNORMAL HIGH (ref 70–99)
Potassium: 4.1 mmol/L (ref 3.5–5.1)
Sodium: 143 mmol/L (ref 135–145)

## 2022-08-03 LAB — PROTIME-INR
INR: 1.2 (ref 0.8–1.2)
Prothrombin Time: 14.7 seconds (ref 11.4–15.2)

## 2022-08-03 LAB — APTT: aPTT: 34 seconds (ref 24–36)

## 2022-08-03 LAB — TYPE AND SCREEN: Antibody Screen: NEGATIVE

## 2022-08-03 MED ORDER — PANCRELIPASE (LIP-PROT-AMYL) 12000-38000 UNITS PO CPEP
24000.0000 [IU] | ORAL_CAPSULE | Freq: Three times a day (TID) | ORAL | Status: DC
  Administered 2022-08-03 (×2): 24000 [IU] via ORAL
  Filled 2022-08-03 (×3): qty 2

## 2022-08-03 MED ORDER — HYDROCODONE-ACETAMINOPHEN 5-325 MG PO TABS
1.0000 | ORAL_TABLET | Freq: Four times a day (QID) | ORAL | Status: DC | PRN
  Administered 2022-08-04 – 2022-08-10 (×5): 2 via ORAL
  Filled 2022-08-03 (×2): qty 2
  Filled 2022-08-03: qty 1
  Filled 2022-08-03 (×3): qty 2
  Filled 2022-08-03: qty 1

## 2022-08-03 MED ORDER — VITAMIN D 25 MCG (1000 UNIT) PO TABS
1000.0000 [IU] | ORAL_TABLET | Freq: Every morning | ORAL | Status: DC
  Administered 2022-08-03: 1000 [IU] via ORAL
  Filled 2022-08-03: qty 1

## 2022-08-03 MED ORDER — PANCRELIPASE (LIP-PROT-AMYL) 12000-38000 UNITS PO CPEP
24000.0000 [IU] | ORAL_CAPSULE | Freq: Three times a day (TID) | ORAL | Status: DC
  Administered 2022-08-04 – 2022-08-09 (×15): 24000 [IU] via ORAL
  Filled 2022-08-03 (×15): qty 2

## 2022-08-03 MED ORDER — POLYETHYLENE GLYCOL 3350 17 G PO PACK: 17.0000 g | PACK | Freq: Every day | ORAL | Status: DC | PRN

## 2022-08-03 MED ORDER — HYDROMORPHONE HCL 1 MG/ML IJ SOLN: 0.5000 mg | INTRAMUSCULAR | Status: DC | PRN

## 2022-08-03 MED ORDER — PREDNISOLONE ACETATE 1 % OP SUSP
1.0000 [drp] | Freq: Four times a day (QID) | OPHTHALMIC | Status: DC
  Administered 2022-08-03 – 2022-08-10 (×16): 1 [drp] via OPHTHALMIC
  Filled 2022-08-03: qty 5

## 2022-08-03 MED ORDER — OXYCODONE HCL 5 MG PO TABS
5.0000 mg | ORAL_TABLET | ORAL | Status: DC | PRN
  Administered 2022-08-03: 5 mg via ORAL
  Filled 2022-08-03: qty 1

## 2022-08-03 MED ORDER — RIFAXIMIN 550 MG PO TABS
550.0000 mg | ORAL_TABLET | Freq: Three times a day (TID) | ORAL | Status: DC
  Administered 2022-08-03 (×2): 550 mg via ORAL
  Filled 2022-08-03 (×3): qty 1

## 2022-08-03 MED ORDER — OYSTER SHELL CALCIUM/D3 500-5 MG-MCG PO TABS
1.0000 | ORAL_TABLET | Freq: Every day | ORAL | Status: DC
  Administered 2022-08-03: 1 via ORAL
  Filled 2022-08-03: qty 1

## 2022-08-03 NOTE — ED Notes (Signed)
charge RN called to bed placement/no bed assignment.Marland Kitchen

## 2022-08-03 NOTE — Plan of Care (Signed)

## 2022-08-03 NOTE — Progress Notes (Signed)
Ortho Trauma Note  Patient with periprosthetic femur fracture and will require open reduction internal fixation. Will likely plan for definitive fixation tomorrow. Plan for transfer to [facility] later today. Okay for diet, NPO after midnight.  Roby Lofts, MD [facility] [phone] (office) [email or web address]

## 2022-08-03 NOTE — ED Notes (Signed)
called to [facility] trauma per MD Katrinka Blazing..QMV:HQION

## 2022-08-03 NOTE — ED Notes (Signed)
Spoke with pt's wife via telephone to update her on pt's progress, and delay in transport. She states that she will be coming to visit him at some point.

## 2022-08-03 NOTE — H&P (Addendum)
History and Physical   James Sanford WUJ:[id number] DOB: [birth year] DOA: 08/03/2022  PCP: Jerl Mina, MD   Patient coming from: Home  Chief Complaint: Fall, leg injury  HPI: James Sanford is a 67 y.o. male with medical history significant of obesity, anemia, hyperlipidemia, hypertension, diabetes, bradycardia, ethanol use, atrial fibrillation, GI bleed, pancreatic insufficiency presenting after fall and leg injury.  Patient fell after slipping when walking up the stairs.  He landed on his left leg and has significant pain at the area following this.  He denies hitting his head nor loss of consciousness.  He is on Eliquis for A-fib.  He denies fevers, chills, chest pain, shortness of breath, abdominal pain, constipation, diarrhea, nausea, vomiting.  Initially presented to [facility].  Based on workup below he was transferred to [facility] for orthopedic intervention.  ED Course: Vital signs in the ED notable for blood pressure in the 110s to 140 systolic.  Lab workup included BMP with glucose 109, calcium 8.2.  CBC with hemoglobin stable at 10.6, platelets 148.  PT, PTT, INR within normal limits.  Patient typed and screened in ED.  Left knee and left femur x-ray showed distal left femur fracture with associated joint effusion which was comminuted.  Also noted was left medullary rod and left knee replacement hardware.  Nothing ordered at [facility] ED per chart review.  Orthopedics they are initially consulted but due to complex [facility] prosthetic fracture recommended, consult and transfer to [facility].  Orthopedics here, Dr. Jena Gauss was consulted and recommended transfer and n.p.o. midnight for likely surgery tomorrow.  Review of Systems: As per HPI otherwise all other systems reviewed and are negative.  Past Medical History:  Diagnosis Date   A-fib (HCC)    Anemia    Arthritis    Hyperlipemia    Hypertension    Memory deficit    Pre-diabetes    Sleep apnea     Past  Surgical History:  Procedure Laterality Date   CATARACT EXTRACTION W/PHACO Right 03/09/2022   Procedure: CATARACT EXTRACTION PHACO AND INTRAOCULAR LENS PLACEMENT (IOC) RIGHT DIABETIC 8.98 01:09.1;  Surgeon: Estanislado Pandy, MD;  Location: [facility];  Service: Ophthalmology;  Laterality: Right;   COLONOSCOPY N/A 12/29/2021   Procedure: COLONOSCOPY;  Surgeon: Regis Bill, MD;  Location: [facility] ENDOSCOPY;  Service: Endoscopy;  Laterality: N/A;   COLONOSCOPY WITH PROPOFOL N/A 01/28/2020   Procedure: COLONOSCOPY WITH PROPOFOL;  Surgeon: Toney Reil, MD;  Location: [facility] ENDOSCOPY;  Service: Gastroenterology;  Laterality: N/A;   COLONOSCOPY WITH PROPOFOL N/A 01/28/2020   Procedure: COLONOSCOPY WITH PROPOFOL;  Surgeon: Toney Reil, MD;  Location: [facility] ENDOSCOPY;  Service: Gastroenterology;  Laterality: N/A;   ESOPHAGOGASTRODUODENOSCOPY N/A 12/29/2021   Procedure: ESOPHAGOGASTRODUODENOSCOPY (EGD);  Surgeon: Regis Bill, MD;  Location: [facility] ENDOSCOPY;  Service: Endoscopy;  Laterality: N/A;   ESOPHAGOGASTRODUODENOSCOPY (EGD) WITH PROPOFOL N/A 01/28/2020   Procedure: ESOPHAGOGASTRODUODENOSCOPY (EGD) WITH PROPOFOL;  Surgeon: Toney Reil, MD;  Location: [facility] ENDOSCOPY;  Service: Gastroenterology;  Laterality: N/A;   FLEXIBLE SIGMOIDOSCOPY N/A 04/06/2022   Procedure: FLEXIBLE SIGMOIDOSCOPY;  Surgeon: Regis Bill, MD;  Location: [facility] ENDOSCOPY;  Service: Endoscopy;  Laterality: N/A;   GASTRIC BYPASS     HERNIA REPAIR     INTRAMEDULLARY (IM) NAIL INTERTROCHANTERIC Left 11/25/2020   Procedure: INTRAMEDULLARY (IM) NAIL INTERTROCHANTRIC;  Surgeon: Lyndle Herrlich, MD;  Location: [facility] ORS;  Service: Orthopedics;  Laterality: Left;   JOINT REPLACEMENT Bilateral    Knee surgery  OPEN REDUCTION INTERNAL FIXATION (ORIF) DISTAL RADIAL FRACTURE Right 10/30/2017   Procedure: OPEN REDUCTION INTERNAL FIXATION (ORIF) DISTAL RADIAL FRACTURE;  Surgeon: Garnette Gunner,  MD;  Location: [facility] ORS;  Service: Orthopedics;  Laterality: Right;   ORIF FEMUR FRACTURE Right 01/29/2020   Procedure: OPEN REDUCTION INTERNAL FIXATION (ORIF) DISTAL FEMUR FRACTURE;  Surgeon: Christena Flake, MD;  Location: [facility] ORS;  Service: Orthopedics;  Laterality: Right;    Social History  reports that he has never smoked. He has quit using smokeless tobacco.  His smokeless tobacco use included chew. He reports that he does not currently use alcohol. He reports that he does not currently use drugs.  Allergies  Allergen Reactions   Penicillins Diarrhea    TOLERATED CEFAZOLIN 11/25/20 Unknown childhood reaction    Family History  Problem Relation Age of Onset   Stroke Mother    Suicidality Father   Reviewed on admission  Prior to Admission medications   Medication Sig Start Date End Date Taking? Authorizing Provider  ACULAR 0.5 % ophthalmic solution Place 1 drop into the right eye 4 (four) times daily. 06/01/22   [provider]  CALTRATE 600+D3 SOFT 600-20 MG-MCG CHEW Chew 1 tablet by mouth daily. 10/31/21   [provider]  cholestyramine (QUESTRAN) 4 g packet Take 1 packet by mouth daily. Patient not taking: Reported on 08/03/2022 01/14/22   [provider]  colestipol (COLESTID) 1 g tablet Take 2 g by mouth 2 (two) times daily. Patient not taking: Reported on 03/06/2022    [provider]  CREON 24000-76000 units CPEP Take 2 capsules by mouth 3 (three) times daily. 02/10/21   [provider]  D3-1000 25 MCG (1000 UT) capsule Take 1,000 Units by mouth every morning. 11/04/21   [provider]  ELIQUIS 5 MG TABS tablet Take 5 mg by mouth 2 (two) times daily. 01/05/20   [provider]  ILEVRO 0.3 % ophthalmic suspension Place 1 drop into the right eye daily. 02/03/22   [provider]  prednisoLONE acetate (PRED FORTE) 1 % ophthalmic suspension Place 1 drop into the right eye 4 (four) times daily. 01/26/22   [provider]  rifaximin (XIFAXAN) 550 MG TABS tablet Take 550 mg by mouth 3 (three) times daily. 07/28/22 08/11/22  [provider]  VIGAMOX 0.5 % ophthalmic solution Place 1 drop into the right eye 4 (four) times daily. 02/02/22   [provider]  Vitamin D, Ergocalciferol, (DRISDOL) 1.25 MG (50000 UNIT) CAPS capsule Take 50,000 Units by mouth once a week. 03/05/22   [provider]    Physical Exam: There were no vitals filed for this visit.  Physical Exam Constitutional:      General: He is not in acute distress.    Appearance: Normal appearance.  HENT:     Head: Normocephalic and atraumatic.     Mouth/Throat:     Mouth: Mucous membranes are moist.     Pharynx: Oropharynx is clear.  Eyes:     Extraocular Movements: Extraocular movements intact.     Pupils: Pupils are equal, round, and reactive to light.  Cardiovascular:     Rate and Rhythm: Normal rate and regular rhythm.     Pulses: Normal pulses.     Heart sounds: Normal heart sounds.  Pulmonary:     Effort: Pulmonary effort is normal. No respiratory distress.     Breath sounds: Normal breath sounds.  Abdominal:     General: Bowel sounds are normal. There  is no distension.     Palpations: Abdomen is soft.     Tenderness: There is no abdominal tenderness.  Musculoskeletal:        General: No swelling or deformity.     Comments: Soft splint in place at LLE  Skin:    General: Skin is warm and dry.  Neurological:     General: No focal deficit present.     Mental Status: Mental status is at baseline.     Labs on Admission: I have personally reviewed following labs and imaging studies  CBC: Recent Labs  Lab 08/03/22 0130  WBC 7.9  NEUTROABS 6.2  HGB 10.6*  HCT 34.8*  MCV 90.6  PLT 148*    Basic Metabolic Panel: Recent Labs  Lab 08/03/22 0130  NA 143  K 4.1  CL 111  CO2 24  GLUCOSE 109*  BUN 23  CREATININE 0.71  CALCIUM 8.2*    GFR: Estimated Creatinine Clearance: 95.8  mL/min (by C-G formula based on SCr of 0.71 mg/dL).  Liver Function Tests: No results for input(s): "AST", "ALT", "ALKPHOS", "BILITOT", "PROT", "ALBUMIN" in the last 168 hours.  Urine analysis:    Component Value Date/Time   COLORURINE YELLOW 03/04/2021 0440   APPEARANCEUR CLEAR 03/04/2021 0440   LABSPEC 1.020 03/04/2021 0440   PHURINE 5.5 03/04/2021 0440   GLUCOSEU NEGATIVE 03/04/2021 0440   HGBUR LARGE (A) 03/04/2021 0440   BILIRUBINUR SMALL (A) 03/04/2021 0440   KETONESUR TRACE (A) 03/04/2021 0440   PROTEINUR 30 (A) 03/04/2021 0440   NITRITE POSITIVE (A) 03/04/2021 0440   LEUKOCYTESUR SMALL (A) 03/04/2021 0440    Radiological Exams on Admission: DG Knee Complete 4 Views Left  Result Date: 08/03/2022 CLINICAL DATA:  Recent fall with left knee pain, initial encounter EXAM: LEFT KNEE - COMPLETE 4+ VIEW COMPARISON:  None Available. FINDINGS: Left knee prosthesis is noted. Comminuted distal femoral fracture is noted at the diaphyseal metaphyseal junction. Mild impaction at the fracture site is seen. Joint effusion is noted. Mild posterior angulation of the distal fracture fragments is seen. IMPRESSION: Distal left femoral fracture with associated joint effusion. Electronically Signed   By: Alcide Clever M.D.   On: 08/03/2022 00:48   DG FEMUR MIN 2 VIEWS LEFT  Result Date: 08/03/2022 CLINICAL DATA:  Recent fall with left leg pain, initial encounter EXAM: LEFT FEMUR 2 VIEWS COMPARISON:  12/17/2020 FINDINGS: Proximal left medullary rod is noted with fixation screw traversing the femoral neck. Healed fracture is noted. In the distal femur however there is a comminuted fracture involving the diaphyseal metaphyseal junction with some impaction at the fracture site. Left knee replacement is noted. IMPRESSION: Comminuted distal femoral fracture as described. Electronically Signed   By: Alcide Clever M.D.   On: 08/03/2022 00:47    EKG: Independently reviewed.  Atrial fibrillation with right bundle  branch block at 76 bpm.  PVC noted.  Assessment/Plan Principal Problem:   Periprosthetic fracture of shaft of femur Active Problems:   Diabetes mellitus type 2, uncomplicated (HCC)   Hyperlipidemia   Essential hypertension   Iron deficiency anemia   Periprosthetic femur fracture, left > Patient fell after slipping when walking up the stairs. > Imaging shows periprosthetic fracture on the left with medullary rod above and knee replacement hardware below. > Orthopedics here consulted on recommendation of orthopedics to [facility] and accept patient for transfer recommending n.p.o. midnight likely surgery tomorrow. - Appreciate orthopedics recommendations, has been notified of patient arrival - Continue to monitor on telemetry -  As needed Tylenol for mild pain, Norco for moderate pain, Dilaudid for severe pain - Care/immobilize per femur fracture protocol - Supportive care  Hypertension Hyperlipidemia Diabetes - Not currently on any medication for this  Atrial fibrillation - Holding home Eliquis in the setting of planned surgery as above  Pancreatic insufficiency > Seen recently for this with associated loose stool. - Continue home Creon  Eye drops > On multiple eyedrops for problems not listed in our system. -Will work with pharmacy to hopefully confirm medications, was able to confirm prednisolone with the patient, his wife will be bringing them in when she visits him.  DVT prophylaxis: SCDs for now Code Status:   Full Family Communication:  None on admission  Disposition Plan:   Patient is from:  Home  Anticipated DC to:  Home  Anticipated DC date:  2 to 4 days  Anticipated DC barriers: None  Consults called:  Orthopedic surgery Admission status:  Inpatient, telemetry  Severity of Illness: The appropriate patient status for this patient is INPATIENT. Inpatient status is judged to be reasonable and necessary in order to provide the required intensity of service to ensure the  patient's safety. The patient's presenting symptoms, physical exam findings, and initial radiographic and laboratory data in the context of their chronic comorbidities is felt to place them at high risk for further clinical deterioration. Furthermore, it is not anticipated that the patient will be medically stable for discharge from the hospital within 2 midnights of admission.   * I certify that at the point of admission it is my clinical judgment that the patient will require inpatient hospital care spanning beyond 2 midnights from the point of admission due to high intensity of service, high risk for further deterioration and high frequency of surveillance required.Synetta Fail MD [facility]  How to contact the [facility] Attending or Consulting provider 7A - 7P or covering provider during after hours 7P -7A, for this patient?   Check the care team in [facility] and look for a) attending/consulting TR[facility] provider listed and b) the [facility] team listed Log into [email or web address] and use [place]'s universal password to access. If you do not have the password, please contact the hospital operator. Locate the [facility] provider you are looking for under [facility] and page to a number that you can be directly reached. If you still have difficulty reaching the provider, please page the [facility] (Director on Call) for the Hospitalists listed on amion for assistance.  08/03/2022, 6:49 PM

## 2022-08-03 NOTE — ED Notes (Signed)
Report called and given to 5 [place] RN Amina

## 2022-08-03 NOTE — Plan of Care (Signed)
  Problem: Education: Goal: Knowledge of General Education information will improve Description: Including pain rating scale, medication(s)/side effects and non-pharmacologic comfort measures Outcome: Progressing   Problem: Clinical Measurements: Goal: Will remain free from infection Outcome: Progressing   Problem: Activity: Goal: Risk for activity intolerance will decrease Outcome: Progressing   Problem: Nutrition: Goal: Adequate nutrition will be maintained Outcome: Progressing   Problem: Pain Managment: Goal: General experience of comfort will improve Outcome: Progressing   

## 2022-08-04 ENCOUNTER — Inpatient Hospital Stay (HOSPITAL_COMMUNITY): Payer: BC Managed Care – PPO | Admitting: Anesthesiology

## 2022-08-04 ENCOUNTER — Encounter (HOSPITAL_COMMUNITY): Payer: Self-pay | Admitting: Internal Medicine

## 2022-08-04 ENCOUNTER — Inpatient Hospital Stay (HOSPITAL_COMMUNITY): Payer: BC Managed Care – PPO

## 2022-08-04 ENCOUNTER — Other Ambulatory Visit: Payer: Self-pay

## 2022-08-04 ENCOUNTER — Inpatient Hospital Stay (HOSPITAL_BASED_OUTPATIENT_CLINIC_OR_DEPARTMENT_OTHER): Payer: BC Managed Care – PPO | Admitting: Anesthesiology

## 2022-08-04 ENCOUNTER — Inpatient Hospital Stay (HOSPITAL_COMMUNITY): Admission: RE | Admit: 2022-08-04 | Payer: Medicare PPO | Source: Home / Self Care | Admitting: Student

## 2022-08-04 ENCOUNTER — Encounter (HOSPITAL_COMMUNITY)
Admission: AD | Disposition: A | Discharge status: Skilled Nursing Facility | Payer: Self-pay | Source: Other Acute Inpatient Hospital | Attending: Internal Medicine

## 2022-08-04 DIAGNOSIS — Z96649 Presence of unspecified artificial hip joint: Secondary | ICD-10-CM | POA: Diagnosis not present

## 2022-08-04 DIAGNOSIS — M9712XA Periprosthetic fracture around internal prosthetic left knee joint, initial encounter: Secondary | ICD-10-CM | POA: Diagnosis present

## 2022-08-04 DIAGNOSIS — K760 Fatty (change of) liver, not elsewhere classified: Secondary | ICD-10-CM | POA: Diagnosis present

## 2022-08-04 DIAGNOSIS — Z87891 Personal history of nicotine dependence: Secondary | ICD-10-CM | POA: Diagnosis not present

## 2022-08-04 DIAGNOSIS — I509 Heart failure, unspecified: Secondary | ICD-10-CM

## 2022-08-04 DIAGNOSIS — D509 Iron deficiency anemia, unspecified: Secondary | ICD-10-CM | POA: Diagnosis present

## 2022-08-04 DIAGNOSIS — I10 Essential (primary) hypertension: Secondary | ICD-10-CM | POA: Diagnosis present

## 2022-08-04 DIAGNOSIS — K638219 Small intestinal bacterial overgrowth, unspecified: Secondary | ICD-10-CM | POA: Diagnosis present

## 2022-08-04 DIAGNOSIS — I4891 Unspecified atrial fibrillation: Secondary | ICD-10-CM | POA: Diagnosis present

## 2022-08-04 DIAGNOSIS — Z7901 Long term (current) use of anticoagulants: Secondary | ICD-10-CM | POA: Diagnosis not present

## 2022-08-04 DIAGNOSIS — Z79899 Other long term (current) drug therapy: Secondary | ICD-10-CM | POA: Diagnosis not present

## 2022-08-04 DIAGNOSIS — G4733 Obstructive sleep apnea (adult) (pediatric): Secondary | ICD-10-CM | POA: Diagnosis present

## 2022-08-04 DIAGNOSIS — I110 Hypertensive heart disease with heart failure: Secondary | ICD-10-CM

## 2022-08-04 DIAGNOSIS — E785 Hyperlipidemia, unspecified: Secondary | ICD-10-CM | POA: Diagnosis present

## 2022-08-04 DIAGNOSIS — Z7409 Other reduced mobility: Secondary | ICD-10-CM | POA: Diagnosis present

## 2022-08-04 DIAGNOSIS — K8689 Other specified diseases of pancreas: Secondary | ICD-10-CM | POA: Diagnosis present

## 2022-08-04 DIAGNOSIS — Z880 Allergy status to penicillin: Secondary | ICD-10-CM | POA: Diagnosis not present

## 2022-08-04 DIAGNOSIS — E119 Type 2 diabetes mellitus without complications: Secondary | ICD-10-CM | POA: Diagnosis present

## 2022-08-04 DIAGNOSIS — M978XXA Periprosthetic fracture around other internal prosthetic joint, initial encounter: Secondary | ICD-10-CM | POA: Diagnosis not present

## 2022-08-04 DIAGNOSIS — G4730 Sleep apnea, unspecified: Secondary | ICD-10-CM | POA: Diagnosis present

## 2022-08-04 DIAGNOSIS — S72402A Unspecified fracture of lower end of left femur, initial encounter for closed fracture: Secondary | ICD-10-CM

## 2022-08-04 DIAGNOSIS — F1020 Alcohol dependence, uncomplicated: Secondary | ICD-10-CM | POA: Diagnosis present

## 2022-08-04 DIAGNOSIS — Z9884 Bariatric surgery status: Secondary | ICD-10-CM | POA: Diagnosis not present

## 2022-08-04 DIAGNOSIS — W010XXA Fall on same level from slipping, tripping and stumbling without subsequent striking against object, initial encounter: Secondary | ICD-10-CM | POA: Diagnosis present

## 2022-08-04 DIAGNOSIS — D62 Acute posthemorrhagic anemia: Secondary | ICD-10-CM | POA: Diagnosis present

## 2022-08-04 DIAGNOSIS — Z9841 Cataract extraction status, right eye: Secondary | ICD-10-CM | POA: Diagnosis not present

## 2022-08-04 DIAGNOSIS — Z961 Presence of intraocular lens: Secondary | ICD-10-CM | POA: Diagnosis present

## 2022-08-04 DIAGNOSIS — Z96653 Presence of artificial knee joint, bilateral: Secondary | ICD-10-CM | POA: Diagnosis present

## 2022-08-04 HISTORY — PX: ORIF FEMUR FRACTURE: SHX2119

## 2022-08-04 LAB — BASIC METABOLIC PANEL
Anion gap: 8 (ref 5–15)
BUN: 18 mg/dL (ref 8–23)
CO2: 22 mmol/L (ref 22–32)
Calcium: 8.2 mg/dL — ABNORMAL LOW (ref 8.9–10.3)
Chloride: 110 mmol/L (ref 98–111)
Creatinine, Ser: 0.7 mg/dL (ref 0.61–1.24)
GFR, Estimated: >60 mL/min (ref >60)
Glucose, Bld: 117 mg/dL — ABNORMAL HIGH (ref 70–99)
Potassium: 3.6 mmol/L (ref 3.5–5.1)
Sodium: 140 mmol/L (ref 135–145)

## 2022-08-04 LAB — CBC
HCT: 30 % — ABNORMAL LOW (ref 39.0–52.0)
Hemoglobin: 9.3 g/dL — ABNORMAL LOW (ref 13.0–17.0)
MCH: 27.5 pg (ref 26.0–34.0)
MCHC: 31 g/dL (ref 30.0–36.0)
MCV: 88.8 fL (ref 80.0–100.0)
Platelets: 127 10*3/uL — ABNORMAL LOW (ref 150–400)
RBC: 3.38 MIL/uL — ABNORMAL LOW (ref 4.22–5.81)
RDW: 17.2 % — ABNORMAL HIGH (ref 11.5–15.5)
WBC: 8.8 10*3/uL (ref 4.0–10.5)
nRBC: 0 % (ref 0.0–0.2)

## 2022-08-04 LAB — TYPE AND SCREEN
ABO/RH(D): A POS
Antibody Screen: NEGATIVE

## 2022-08-04 LAB — SURGICAL PCR SCREEN
MRSA, PCR: NEGATIVE
Staphylococcus aureus: NEGATIVE

## 2022-08-04 LAB — HIV ANTIBODY (ROUTINE TESTING W REFLEX): HIV Screen 4th Generation wRfx: NONREACTIVE

## 2022-08-04 SURGERY — OPEN REDUCTION INTERNAL FIXATION (ORIF) DISTAL FEMUR FRACTURE
Anesthesia: General | Laterality: Left

## 2022-08-04 MED ORDER — CEFAZOLIN SODIUM-DEXTROSE 2-4 GM/100ML-% IV SOLN
INTRAVENOUS | Status: AC
  Filled 2022-08-04: qty 100

## 2022-08-04 MED ORDER — ONDANSETRON HCL 4 MG/2ML IJ SOLN: 4.0000 mg | Freq: Once | INTRAMUSCULAR | Status: DC | PRN

## 2022-08-04 MED ORDER — DOCUSATE SODIUM 100 MG PO CAPS
100.0000 mg | ORAL_CAPSULE | Freq: Two times a day (BID) | ORAL | Status: DC
  Administered 2022-08-04 – 2022-08-10 (×8): 100 mg via ORAL
  Filled 2022-08-04 (×14): qty 1

## 2022-08-04 MED ORDER — SODIUM CHLORIDE 0.9 % IV SOLN: INTRAVENOUS | Status: DC

## 2022-08-04 MED ORDER — FENTANYL CITRATE PF 50 MCG/ML IJ SOSY: 50.0000 ug | PREFILLED_SYRINGE | Freq: Once | INTRAMUSCULAR | Status: DC

## 2022-08-04 MED ORDER — PHENYLEPHRINE HCL-NACL 20-0.9 MG/250ML-% IV SOLN
INTRAVENOUS | Status: DC | PRN
  Administered 2022-08-04: 25 ug/min via INTRAVENOUS
  Administered 2022-08-04: 40 ug/min via INTRAVENOUS
  Administered 2022-08-04: 50 ug/min via INTRAVENOUS

## 2022-08-04 MED ORDER — BUPIVACAINE HCL (PF) 0.5 % IJ SOLN
INTRAMUSCULAR | Status: DC | PRN
  Administered 2022-08-04: 20 mL via PERINEURAL

## 2022-08-04 MED ORDER — DEXAMETHASONE SODIUM PHOSPHATE 10 MG/ML IJ SOLN
INTRAMUSCULAR | Status: DC | PRN
  Administered 2022-08-04: 10 mg via INTRAVENOUS

## 2022-08-04 MED ORDER — PHENYLEPHRINE 80 MCG/ML (10ML) SYRINGE FOR IV PUSH (FOR BLOOD PRESSURE SUPPORT)
PREFILLED_SYRINGE | INTRAVENOUS | Status: DC | PRN
  Administered 2022-08-04 (×5): 80 ug via INTRAVENOUS

## 2022-08-04 MED ORDER — VANCOMYCIN HCL 1000 MG IV SOLR
INTRAVENOUS | Status: AC
  Filled 2022-08-04: qty 20

## 2022-08-04 MED ORDER — FENTANYL CITRATE (PF) 100 MCG/2ML IJ SOLN
INTRAMUSCULAR | Status: AC
  Filled 2022-08-04: qty 2

## 2022-08-04 MED ORDER — ORAL CARE MOUTH RINSE: 15.0000 mL | Freq: Once | OROMUCOSAL | Status: AC

## 2022-08-04 MED ORDER — PROPOFOL 10 MG/ML IV BOLUS
INTRAVENOUS | Status: DC | PRN
  Administered 2022-08-04: 70 mg via INTRAVENOUS

## 2022-08-04 MED ORDER — FENTANYL CITRATE (PF) 250 MCG/5ML IJ SOLN
INTRAMUSCULAR | Status: AC
  Filled 2022-08-04: qty 5

## 2022-08-04 MED ORDER — LACTATED RINGERS IV SOLN: INTRAVENOUS | Status: DC

## 2022-08-04 MED ORDER — ONDANSETRON HCL 4 MG PO TABS: 4.0000 mg | ORAL_TABLET | Freq: Four times a day (QID) | ORAL | Status: DC | PRN

## 2022-08-04 MED ORDER — FENTANYL CITRATE (PF) 100 MCG/2ML IJ SOLN
50.0000 ug | Freq: Once | INTRAMUSCULAR | Status: AC
  Administered 2022-08-04: 50 ug via INTRAVENOUS

## 2022-08-04 MED ORDER — LIDOCAINE-EPINEPHRINE (PF) 1.5 %-1:200000 IJ SOLN
INTRAMUSCULAR | Status: DC | PRN
  Administered 2022-08-04: 10 mL via PERINEURAL

## 2022-08-04 MED ORDER — CEFAZOLIN SODIUM-DEXTROSE 2-4 GM/100ML-% IV SOLN
2.0000 g | INTRAVENOUS | Status: AC
  Administered 2022-08-04: 2 g via INTRAVENOUS

## 2022-08-04 MED ORDER — METOCLOPRAMIDE HCL 5 MG/ML IJ SOLN: 5.0000 mg | Freq: Three times a day (TID) | INTRAMUSCULAR | Status: DC | PRN

## 2022-08-04 MED ORDER — FENTANYL CITRATE (PF) 250 MCG/5ML IJ SOLN
INTRAMUSCULAR | Status: DC | PRN
  Administered 2022-08-04: 50 ug via INTRAVENOUS

## 2022-08-04 MED ORDER — PHENYLEPHRINE 80 MCG/ML (10ML) SYRINGE FOR IV PUSH (FOR BLOOD PRESSURE SUPPORT)
PREFILLED_SYRINGE | INTRAVENOUS | Status: AC
  Filled 2022-08-04: qty 10

## 2022-08-04 MED ORDER — CHLORHEXIDINE GLUCONATE 0.12 % MT SOLN
OROMUCOSAL | Status: AC
  Filled 2022-08-04: qty 15

## 2022-08-04 MED ORDER — PROPOFOL 10 MG/ML IV BOLUS
INTRAVENOUS | Status: AC
  Filled 2022-08-04: qty 20

## 2022-08-04 MED ORDER — FENTANYL CITRATE (PF) 100 MCG/2ML IJ SOLN: 25.0000 ug | INTRAMUSCULAR | Status: DC | PRN

## 2022-08-04 MED ORDER — SUGAMMADEX SODIUM 200 MG/2ML IV SOLN
INTRAVENOUS | Status: DC | PRN
  Administered 2022-08-04: 200 mg via INTRAVENOUS

## 2022-08-04 MED ORDER — CEFAZOLIN SODIUM-DEXTROSE 2-4 GM/100ML-% IV SOLN
2.0000 g | Freq: Three times a day (TID) | INTRAVENOUS | Status: AC
  Administered 2022-08-04 – 2022-08-05 (×3): 2 g via INTRAVENOUS
  Filled 2022-08-04 (×3): qty 100

## 2022-08-04 MED ORDER — TRANEXAMIC ACID-NACL 1000-0.7 MG/100ML-% IV SOLN
INTRAVENOUS | Status: AC
  Filled 2022-08-04: qty 100

## 2022-08-04 MED ORDER — 0.9 % SODIUM CHLORIDE (POUR BTL) OPTIME
TOPICAL | Status: DC | PRN
  Administered 2022-08-04: 1000 mL

## 2022-08-04 MED ORDER — CHLORHEXIDINE GLUCONATE 4 % EX SOLN: 60.0000 mL | Freq: Once | CUTANEOUS | Status: DC

## 2022-08-04 MED ORDER — VANCOMYCIN HCL 1000 MG IV SOLR
INTRAVENOUS | Status: DC | PRN
  Administered 2022-08-04: 1000 mg

## 2022-08-04 MED ORDER — OXYCODONE HCL 5 MG/5ML PO SOLN: 5.0000 mg | Freq: Once | ORAL | Status: DC | PRN

## 2022-08-04 MED ORDER — MIDAZOLAM HCL 2 MG/2ML IJ SOLN
INTRAMUSCULAR | Status: AC
  Filled 2022-08-04: qty 2

## 2022-08-04 MED ORDER — LIDOCAINE 2% (20 MG/ML) 5 ML SYRINGE
INTRAMUSCULAR | Status: AC
  Filled 2022-08-04: qty 5

## 2022-08-04 MED ORDER — ONDANSETRON HCL 4 MG/2ML IJ SOLN: 4.0000 mg | Freq: Four times a day (QID) | INTRAMUSCULAR | Status: DC | PRN

## 2022-08-04 MED ORDER — CHLORHEXIDINE GLUCONATE 0.12 % MT SOLN
15.0000 mL | Freq: Once | OROMUCOSAL | Status: AC
  Administered 2022-08-04: 15 mL via OROMUCOSAL

## 2022-08-04 MED ORDER — METHOCARBAMOL 1000 MG/10ML IJ SOLN: 500.0000 mg | Freq: Four times a day (QID) | INTRAVENOUS | Status: DC | PRN

## 2022-08-04 MED ORDER — ONDANSETRON HCL 4 MG/2ML IJ SOLN
INTRAMUSCULAR | Status: DC | PRN
  Administered 2022-08-04: 4 mg via INTRAVENOUS

## 2022-08-04 MED ORDER — OXYCODONE HCL 5 MG PO TABS: 5.0000 mg | ORAL_TABLET | Freq: Once | ORAL | Status: DC | PRN

## 2022-08-04 MED ORDER — POVIDONE-IODINE 10 % EX SWAB
2.0000 | Freq: Once | CUTANEOUS | Status: AC
  Administered 2022-08-04: 2 via TOPICAL

## 2022-08-04 MED ORDER — METOCLOPRAMIDE HCL 5 MG PO TABS: 5.0000 mg | ORAL_TABLET | Freq: Three times a day (TID) | ORAL | Status: DC | PRN

## 2022-08-04 MED ORDER — LIDOCAINE 2% (20 MG/ML) 5 ML SYRINGE
INTRAMUSCULAR | Status: DC | PRN
  Administered 2022-08-04: 100 mg via INTRAVENOUS

## 2022-08-04 MED ORDER — ACETAMINOPHEN 325 MG PO TABS
325.0000 mg | ORAL_TABLET | Freq: Four times a day (QID) | ORAL | Status: DC | PRN
  Administered 2022-08-05 – 2022-08-10 (×2): 650 mg via ORAL
  Filled 2022-08-04 (×2): qty 2

## 2022-08-04 MED ORDER — TRANEXAMIC ACID-NACL 1000-0.7 MG/100ML-% IV SOLN
1000.0000 mg | INTRAVENOUS | Status: AC
  Administered 2022-08-04: 1000 mg via INTRAVENOUS

## 2022-08-04 MED ORDER — ONDANSETRON HCL 4 MG/2ML IJ SOLN
INTRAMUSCULAR | Status: AC
  Filled 2022-08-04: qty 2

## 2022-08-04 MED ORDER — ALBUMIN HUMAN 5 % IV SOLN: INTRAVENOUS | Status: DC | PRN

## 2022-08-04 MED ORDER — DEXAMETHASONE SODIUM PHOSPHATE 10 MG/ML IJ SOLN
INTRAMUSCULAR | Status: AC
  Filled 2022-08-04: qty 1

## 2022-08-04 MED ORDER — HYDROCODONE-ACETAMINOPHEN 7.5-325 MG PO TABS: 1.0000 | ORAL_TABLET | ORAL | Status: DC | PRN

## 2022-08-04 MED ORDER — METHOCARBAMOL 500 MG PO TABS
500.0000 mg | ORAL_TABLET | Freq: Four times a day (QID) | ORAL | Status: DC | PRN
  Administered 2022-08-08: 500 mg via ORAL
  Filled 2022-08-04 (×2): qty 1

## 2022-08-04 MED ORDER — ROCURONIUM BROMIDE 10 MG/ML (PF) SYRINGE
PREFILLED_SYRINGE | INTRAVENOUS | Status: DC | PRN
  Administered 2022-08-04: 60 mg via INTRAVENOUS

## 2022-08-04 SURGICAL SUPPLY — 81 items
ADH SKN CLS APL DERMABOND .7 (GAUZE/BANDAGES/DRESSINGS) ×1
APL PRP STRL LF DISP 70% ISPRP (MISCELLANEOUS) ×2
BAG COUNTER SPONGE SURGICOUNT (BAG) ×1 IMPLANT
BAG SPNG CNTER NS LX DISP (BAG) ×1
BIT DRILL 4.3 (BIT) ×1
BIT DRILL 4.3X300MM (BIT) IMPLANT
BIT DRILL LONG 3.3 (BIT) IMPLANT
BIT DRILL QC 3.3X195 (BIT) IMPLANT
BLADE CLIPPER SURG (BLADE) IMPLANT
BNDG CMPR 5X4 KNIT ELC UNQ LF (GAUZE/BANDAGES/DRESSINGS) ×1
BNDG CMPR 5X6 CHSV STRCH STRL (GAUZE/BANDAGES/DRESSINGS)
BNDG CMPR 5X62 HK CLSR LF (GAUZE/BANDAGES/DRESSINGS) ×1
BNDG CMPR 6 X 5 YARDS HK CLSR (GAUZE/BANDAGES/DRESSINGS) ×1
BNDG CMPR MED 10X6 ELC LF (GAUZE/BANDAGES/DRESSINGS)
BNDG COHESIVE 6X5 TAN ST LF (GAUZE/BANDAGES/DRESSINGS) ×1 IMPLANT
BNDG ELASTIC 4INX 5YD STR LF (GAUZE/BANDAGES/DRESSINGS) IMPLANT
BNDG ELASTIC 6INX 5YD STR LF (GAUZE/BANDAGES/DRESSINGS) IMPLANT
BNDG ELASTIC 6X10 VLCR STRL LF (GAUZE/BANDAGES/DRESSINGS) ×1 IMPLANT
BRUSH SCRUB EZ PLAIN DRY (MISCELLANEOUS) ×2 IMPLANT
CANISTER SUCT 3000ML PPV (MISCELLANEOUS) ×1 IMPLANT
CAP LOCK NCB (Cap) IMPLANT
CHLORAPREP W/TINT 26 (MISCELLANEOUS) ×1 IMPLANT
COVER SURGICAL LIGHT HANDLE (MISCELLANEOUS) ×1 IMPLANT
DERMABOND ADVANCED .7 DNX12 (GAUZE/BANDAGES/DRESSINGS) IMPLANT
DRAPE C-ARM 42X72 X-RAY (DRAPES) ×1 IMPLANT
DRAPE C-ARMOR (DRAPES) ×1 IMPLANT
DRAPE HALF SHEET 40X57 (DRAPES) ×2 IMPLANT
DRAPE ORTHO SPLIT 77X108 STRL (DRAPES) ×2
DRAPE SURG 17X23 STRL (DRAPES) ×1 IMPLANT
DRAPE SURG ORHT 6 SPLT 77X108 (DRAPES) ×2 IMPLANT
DRAPE U-SHAPE 47X51 STRL (DRAPES) ×1 IMPLANT
DRESSING MEPILEX FLEX 4X4 (GAUZE/BANDAGES/DRESSINGS) IMPLANT
DRSG ADAPTIC 3X8 NADH LF (GAUZE/BANDAGES/DRESSINGS) IMPLANT
DRSG MEPILEX FLEX 4X4 (GAUZE/BANDAGES/DRESSINGS)
DRSG MEPILEX POST OP 4X12 (GAUZE/BANDAGES/DRESSINGS) IMPLANT
DRSG MEPILEX POST OP 4X8 (GAUZE/BANDAGES/DRESSINGS) IMPLANT
ELECT REM PT RETURN 9FT ADLT (ELECTROSURGICAL) ×1
ELECTRODE REM PT RTRN 9FT ADLT (ELECTROSURGICAL) ×1 IMPLANT
GAUZE PAD ABD 8X10 STRL (GAUZE/BANDAGES/DRESSINGS) ×3 IMPLANT
GAUZE SPONGE 4X4 12PLY STRL (GAUZE/BANDAGES/DRESSINGS) ×1 IMPLANT
GLOVE BIO SURGEON STRL SZ 6.5 (GLOVE) ×3 IMPLANT
GLOVE BIO SURGEON STRL SZ7.5 (GLOVE) ×4 IMPLANT
GLOVE BIOGEL PI IND STRL 6.5 (GLOVE) ×1 IMPLANT
GLOVE BIOGEL PI IND STRL 7.5 (GLOVE) ×1 IMPLANT
GOWN STRL REUS W/ TWL LRG LVL3 (GOWN DISPOSABLE) ×3 IMPLANT
GOWN STRL REUS W/TWL LRG LVL3 (GOWN DISPOSABLE) ×3
K-WIRE 2.0 (WIRE) ×1
K-WIRE FXSTD 280X2XNS SS (WIRE) ×1
KIT BASIN OR (CUSTOM PROCEDURE TRAY) ×1 IMPLANT
KIT TURNOVER KIT B (KITS) ×1 IMPLANT
KWIRE FXSTD 280X2XNS SS (WIRE) IMPLANT
NS IRRIG 1000ML POUR BTL (IV SOLUTION) ×1 IMPLANT
PACK TOTAL JOINT (CUSTOM PROCEDURE TRAY) ×1 IMPLANT
PAD ARMBOARD 7.5X6 YLW CONV (MISCELLANEOUS) ×2 IMPLANT
PAD CAST 4YDX4 CTTN HI CHSV (CAST SUPPLIES) ×1 IMPLANT
PADDING CAST COTTON 4X4 STRL (CAST SUPPLIES) ×1
PADDING CAST COTTON 6X4 STRL (CAST SUPPLIES) ×1 IMPLANT
PLATE NCB 15H HIP (Plate) IMPLANT
SCREW 5.0 60MM (Screw) IMPLANT
SCREW CORTICAL 5.0X95MM (Screw) IMPLANT
SCREW CORTICAL NCB 5.0X44 (Screw) IMPLANT
SCREW CORTICAL NCB 5.0X65 (Screw) IMPLANT
SCREW CORTICAL NCB 5.0X90MM (Screw) IMPLANT
SCREW NCB 4.0MX42M (Screw) IMPLANT
SCREW NCB 4.0MX44M (Screw) IMPLANT
SCREW NCB 5.0X46MM (Screw) IMPLANT
SPONGE T-LAP 18X18 ~~LOC~~+RFID (SPONGE) IMPLANT
STAPLER VISISTAT 35W (STAPLE) ×1 IMPLANT
SUCTION FRAZIER HANDLE 10FR (MISCELLANEOUS) ×1
SUCTION TUBE FRAZIER 10FR DISP (MISCELLANEOUS) ×1 IMPLANT
SUT ETHILON 3 0 PS 1 (SUTURE) ×2 IMPLANT
SUT MNCRL AB 3-0 PS2 27 (SUTURE) IMPLANT
SUT VIC AB 0 CT1 27 (SUTURE)
SUT VIC AB 0 CT1 27XBRD ANBCTR (SUTURE) IMPLANT
SUT VIC AB 1 CT1 27 (SUTURE)
SUT VIC AB 1 CT1 27XBRD ANBCTR (SUTURE) IMPLANT
SUT VIC AB 2-0 CT1 27 (SUTURE) ×2
SUT VIC AB 2-0 CT1 TAPERPNT 27 (SUTURE) ×2 IMPLANT
TOWEL GREEN STERILE (TOWEL DISPOSABLE) ×2 IMPLANT
TRAY FOLEY MTR SLVR 16FR STAT (SET/KITS/TRAYS/PACK) IMPLANT
WATER STERILE IRR 1000ML POUR (IV SOLUTION) ×2 IMPLANT

## 2022-08-04 NOTE — Interval H&P Note (Signed)
History and Physical Interval Note:  08/04/2022 9:55 AM  James Sanford  has presented today for surgery, with the diagnosis of Left distal femur fracture.  The various methods of treatment have been discussed with the patient and family. After consideration of risks, benefits and other options for treatment, the patient has consented to  Procedure(s): OPEN REDUCTION INTERNAL FIXATION (ORIF) DISTAL FEMUR FRACTURE (Left) as a surgical intervention.  The patient's history has been reviewed, patient examined, no change in status, stable for surgery.  I have reviewed the patient's chart and labs.  Questions were answered to the patient's satisfaction.     Caryn Bee P Terresa Marlett

## 2022-08-04 NOTE — Anesthesia Procedure Notes (Signed)
Anesthesia Procedure Image    

## 2022-08-04 NOTE — Evaluation (Signed)
Physical Therapy Evaluation Patient Details Name: James Sanford MRN: [id number] DOB: 1955/12/22 Today's Date: 08/04/2022  History of Present Illness  Pt is a 67 y/o M admitted on 08/03/22 after presenting with c/o a fall after slipping on the stairs. X-ray showed periprosthetic distal L femur fx. Pt underwent ORIF of L distal femur fx & removal of hardware by Dr. Jena Gauss on 08/04/22. PMH: a-fib, anemia, arthritis, HLD, HTN, memory deficit, pre-diabetes, sleep apnea, obesity, ethanol use, GI bleed  Clinical Impression  Pt seen for PT evaluation with pt received in bed & just received meal tray. PT offered to assist pt to recliner to eat meal or come back later but pt agreeable to getting in recliner. Pt is able to complete bed mobility with mod I, using BUE to assist LLE to EOB. Pt is able to complete STS & step pivot bed>recliner with RW & min assist. Pt presents with some numbness still in LLE & weakness/buckling during transfer. Anticipate pt will progress well during PT session tomorrow.       Recommendations for follow up therapy are one component of a multi-disciplinary discharge planning process, led by the attending physician.  Recommendations may be updated based on patient status, additional functional criteria and insurance authorization.  Follow Up Recommendations       Assistance Recommended at Discharge Intermittent Supervision/Assistance  Patient can return home with the following  A little help with walking and/or transfers;A little help with bathing/dressing/bathroom;Assistance with cooking/housework;Help with stairs or ramp for entrance;Assist for transportation    Equipment Recommendations None recommended by PT  Recommendations for Other Services       Functional Status Assessment Patient has had a recent decline in their functional status and demonstrates the ability to make significant improvements in function in a reasonable and predictable amount of time.      Precautions / Restrictions Precautions Precautions: Fall Restrictions Weight Bearing Restrictions: Yes LLE Weight Bearing: Weight bearing as tolerated      Mobility  Bed Mobility Overal bed mobility: Modified Independent Bed Mobility: Supine to Sit     Supine to sit: Modified independent (Device/Increase time), HOB elevated     General bed mobility comments: Pt uses BUE to assist LLE to EOB.    Transfers Overall transfer level: Needs assistance Equipment used: Rolling walker (2 wheels) Transfers: Sit to/from Stand, Bed to chair/wheelchair/BSC Sit to Stand: Min assist   Step pivot transfers: Min assist       General transfer comment: Step pivot bed>recliner with RW & min assist, pt with frequent L knee buckling but able to correct, decreased weight shift to LLE during transfer.    Ambulation/Gait                  Stairs            Wheelchair Mobility    Modified Rankin (Stroke Patients Only)       Balance Overall balance assessment: Needs assistance Sitting-balance support: Feet supported Sitting balance-Leahy Scale: Good     Standing balance support: During functional activity, Bilateral upper extremity supported, Reliant on assistive device for balance Standing balance-Leahy Scale: Fair                               Pertinent Vitals/Pain Pain Assessment Pain Assessment: No/denies pain    Home Living Family/patient expects to be discharged to:: Private residence Living Arrangements: Spouse/significant other Available Help at Discharge: Family Type of  Home: House Home Access: Ramped entrance     Alternate Level Stairs-Number of Steps: full flight but has stair lift so doesn't have to physically negotiate stairs Home Layout: Two level;1/2 bath on main level;Bed/bath upstairs Home Equipment: Rolling Walker (2 wheels);Wheelchair - manual      Prior Function Prior Level of Function : Independent/Modified Independent              Mobility Comments: Pt reports he was independent without AD prior to this admission. ADLs Comments: Pt notes he uses briefs 2/2 bowel incontinence at baseline.     Hand Dominance        Extremity/Trunk Assessment   Upper Extremity Assessment Upper Extremity Assessment: Overall WFL for tasks assessed    Lower Extremity Assessment Lower Extremity Assessment: LLE deficits/detail LLE Deficits / Details: unable to move against gravity in supine or sitting, endorses numbness in LLE but is able to feel some light touch       Communication   Communication: No difficulties  Cognition Arousal/Alertness: Awake/alert Behavior During Therapy: WFL for tasks assessed/performed Overall Cognitive Status: Within Functional Limits for tasks assessed                                          General Comments General comments (skin integrity, edema, etc.): Max HR 123 bpm    Exercises     Assessment/Plan    PT Assessment Patient needs continued PT services  PT Problem List Decreased strength;Decreased range of motion;Decreased activity tolerance;Impaired sensation;Decreased knowledge of use of DME;Decreased safety awareness;Decreased balance;Decreased mobility       PT Treatment Interventions Therapeutic exercise;Gait training;Balance training;DME instruction;Neuromuscular re-education;Functional mobility training;Therapeutic activities;Patient/family education;Modalities;Manual techniques    PT Goals (Current goals can be found in the Care Plan section)  Acute Rehab PT Goals Patient Stated Goal: get better PT Goal Formulation: With patient Time For Goal Achievement: 08/18/22 Potential to Achieve Goals: Good    Frequency Min 4X/week     Co-evaluation               AM-PAC PT "6 Clicks" Mobility  Outcome Measure Help needed turning from your back to your side while in a flat bed without using bedrails?: None Help needed moving from lying on your  back to sitting on the side of a flat bed without using bedrails?: None Help needed moving to and from a bed to a chair (including a wheelchair)?: A Little Help needed standing up from a chair using your arms (e.g., wheelchair or bedside chair)?: A Little Help needed to walk in hospital room?: A Lot Help needed climbing 3-5 steps with a railing? : Total 6 Click Score: 17    End of Session   Activity Tolerance: Patient tolerated treatment well Patient left: in chair;with chair alarm set;with call bell/phone within reach Nurse Communication: Mobility status PT Visit Diagnosis: Difficulty in walking, not elsewhere classified (R26.2);Muscle weakness (generalized) (M62.81);Other abnormalities of gait and mobility (R26.89);Unsteadiness on feet (R26.81)    Time: 9604-5409 PT Time Calculation (min) (ACUTE ONLY): 13 min   Charges:   PT Evaluation $PT Eval Low Complexity: 1 Low          Aleda Grana, PT, DPT 08/04/22, 3:18 PM   Sandi Mariscal 08/04/2022, 3:16 PM

## 2022-08-04 NOTE — Transfer of Care (Signed)
Immediate Anesthesia Transfer of Care Note  Patient: James Sanford  Procedure(s) Performed: OPEN REDUCTION INTERNAL FIXATION (ORIF) DISTAL FEMUR FRACTURE (Left)  Patient Location: PACU  Anesthesia Type:General and Regional  Level of Consciousness: awake and alert   Airway & Oxygen Therapy: Patient Spontanous Breathing and Patient connected to face mask oxygen  Post-op Assessment: Report given to RN and Post -op Vital signs reviewed and stable  Post vital signs: Reviewed and stable  Last Vitals:  Vitals Value Taken Time  BP 107/82 08/04/22 1210  Temp    Pulse 71 08/04/22 1211  Resp 12 08/04/22 1211  SpO2 100 % 08/04/22 1211  Vitals shown include unvalidated device data.  Last Pain:  Vitals:   08/04/22 0933  TempSrc:   PainSc: 0-No pain         Complications: No notable events documented.

## 2022-08-04 NOTE — H&P (View-Only) (Signed)
Reason for Consult:Left distal femur fx Referring Physician: Melina Schools Dahal Time called: 0730 Time at bedside: 0856   James Sanford is an 67 y.o. male.  HPI: James Sanford was at home and tripped and fell. He had immediate left knee pain and could not get up. He was brought to the ED at [facility] where x-rays showed a periprosthetic distal femur fx. Orthopedic surgery was consulted and recommended transfer to [facility] for definitive fixation. He lives at home with his wife and was not using any assistive devices to transfer.  Past Medical History:  Diagnosis Date   A-fib (HCC)    Anemia    Arthritis    Hyperlipemia    Hypertension    Memory deficit    Pre-diabetes    Sleep apnea     Past Surgical History:  Procedure Laterality Date   CATARACT EXTRACTION W/PHACO Right 03/09/2022   Procedure: CATARACT EXTRACTION PHACO AND INTRAOCULAR LENS PLACEMENT (IOC) RIGHT DIABETIC 8.98 01:09.1;  Surgeon: Estanislado Pandy, MD;  Location: [facility];  Service: Ophthalmology;  Laterality: Right;   COLONOSCOPY N/A 12/29/2021   Procedure: COLONOSCOPY;  Surgeon: Regis Bill, MD;  Location: [facility] ENDOSCOPY;  Service: Endoscopy;  Laterality: N/A;   COLONOSCOPY WITH PROPOFOL N/A 01/28/2020   Procedure: COLONOSCOPY WITH PROPOFOL;  Surgeon: Toney Reil, MD;  Location: [facility] ENDOSCOPY;  Service: Gastroenterology;  Laterality: N/A;   COLONOSCOPY WITH PROPOFOL N/A 01/28/2020   Procedure: COLONOSCOPY WITH PROPOFOL;  Surgeon: Toney Reil, MD;  Location: [facility] END[facility]COPY;  Service: Gastroenterology;  Laterality: N/A;   ESOPHAGOGASTRODUODENOSCOPY N/A 12/29/2021   Procedure: ESOPHAGOGASTRODUODENOSCOPY (EGD);  Surgeon: Regis Bill, MD;  Location: [facility] ENDOSCOPY;  Service: Endoscopy;  Laterality: N/A;   ESOPHAGOGASTRODUODENOSCOPY (EGD) WITH PROPOFOL N/A 01/28/2020   Procedure: ESOPHAGOGASTRODUODENOSCOPY (EGD) WITH PROPOFOL;  Surgeon: Toney Reil, MD;  Location: [facility] ENDOSCOPY;   Service: Gastroenterology;  Laterality: N/A;   FLEXIBLE SIGMOIDOSCOPY N/A 04/06/2022   Procedure: FLEXIBLE SIGMOIDOSCOPY;  Surgeon: Regis Bill, MD;  Location: [facility] ENDOSCOPY;  Service: Endoscopy;  Laterality: N/A;   GASTRIC BYPASS     HERNIA REPAIR     INTRAMEDULLARY (IM) NAIL INTERTROCHANTERIC Left 11/25/2020   Procedure: INTRAMEDULLARY (IM) NAIL INTERTROCHANTRIC;  Surgeon: Lyndle Herrlich, MD;  Location: [facility] ORS;  Service: Orthopedics;  Laterality: Left;   JOINT REPLACEMENT Bilateral    Knee surgery   OPEN REDUCTION INTERNAL FIXATION (ORIF) DISTAL RADIAL FRACTURE Right 10/30/2017   Procedure: OPEN REDUCTION INTERNAL FIXATION (ORIF) DISTAL RADIAL FRACTURE;  Surgeon: Garnette Gunner, MD;  Location: [facility] ORS;  Service: Orthopedics;  Laterality: Right;   ORIF FEMUR FRACTURE Right 01/29/2020   Procedure: OPEN REDUCTION INTERNAL FIXATION (ORIF) DISTAL FEMUR FRACTURE;  Surgeon: Christena Flake, MD;  Location: [facility] ORS;  Service: Orthopedics;  Laterality: Right;    Family History  Problem Relation Age of Onset   Stroke Mother    Suicidality Father     Social History:  reports that he has never smoked. He has quit using smokeless tobacco.  His smokeless tobacco use included chew. He reports that he does not currently use alcohol. He reports that he does not currently use drugs.  Allergies:  Allergies  Allergen Reactions   Penicillins Diarrhea    TOLERATED CEFAZOLIN 11/25/20 Unknown childhood reaction    Medications: I have reviewed the patient's current medications.  Results for orders placed or performed during the hospital encounter of 08/03/22 (from the past 48 hour(s))  HIV Antibody (routine testing w rflx)  Status: None   Collection Time: 08/03/22  6:45 PM  Result Value Ref Range   HIV Screen 4th Generation wRfx Non Reactive Non Reactive    Comment: Performed at [facility], [address], [place], [place] [address]  Type and screen [facility]      Status: None   Collection Time: 08/04/22  7:37 AM  Result Value Ref Range   ABO/RH(D) A POS    Antibody Screen NEG    Sample Expiration      08/07/2022,2359 Performed at [facility], [address], [place], [place] [address]   CBC     Status: Abnormal   Collection Time: 08/04/22  7:38 AM  Result Value Ref Range   WBC 8.8 4.0 - 10.5 K/uL   RBC 3.38 (L) 4.22 - 5.81 MIL/uL   Hemoglobin 9.3 (L) 13.0 - 17.0 g/dL   HCT 09.8 (L) 11.9 - 14.7 %   MCV 88.8 80.0 - 100.0 fL   MCH 27.5 26.0 - 34.0 pg   MCHC 31.0 30.0 - 36.0 g/dL   RDW 82.9 (H) 56.2 - 13.0 %   Platelets 127 (L) 150 - 400 K/uL    Comment: REPEATED TO VERIFY   nRBC 0.0 0.0 - 0.2 %    Comment: Performed at [facility], [address], [place], [place] [address]  Basic metabolic panel     Status: Abnormal   Collection Time: 08/04/22  7:38 AM  Result Value Ref Range   Sodium 140 135 - 145 mmol/L   Potassium 3.6 3.5 - 5.1 mmol/L   Chloride 110 98 - 111 mmol/L   CO2 22 22 - 32 mmol/L   Glucose, Bld 117 (H) 70 - 99 mg/dL    Comment: Glucose reference range applies only to samples taken after fasting for at least 8 hours.   BUN 18 8 - 23 mg/dL   Creatinine, Ser 4.69 0.61 - 1.24 mg/dL   Calcium 8.2 (L) 8.9 - 10.3 mg/dL   GFR, Estimated >62 >95 mL/min    Comment: (NOTE) Calculated using the CKD-EPI Creatinine Equation (2021)    Anion gap 8 5 - 15    Comment: Performed at [facility], [address], [place], [place] [address]    DG Knee Complete 4 Views Left  Result Date: 08/03/2022 CLINICAL DATA:  Recent fall with left knee pain, initial encounter EXAM: LEFT KNEE - COMPLETE 4+ VIEW COMPARISON:  None Available. FINDINGS: Left knee prosthesis is noted. Comminuted distal femoral fracture is noted at the diaphyseal metaphyseal junction. Mild impaction at the fracture site is seen. Joint effusion is noted. Mild posterior angulation of the distal fracture fragments is seen. IMPRESSION: Distal left femoral  fracture with associated joint effusion. Electronically Signed   By: Alcide Clever M.D.   On: 08/03/2022 00:48   DG FEMUR MIN 2 VIEWS LEFT  Result Date: 08/03/2022 CLINICAL DATA:  Recent fall with left leg pain, initial encounter EXAM: LEFT FEMUR 2 VIEWS COMPARISON:  12/17/2020 FINDINGS: Proximal left medullary rod is noted with fixation screw traversing the femoral neck. Healed fracture is noted. In the distal femur however there is a comminuted fracture involving the diaphyseal metaphyseal junction with some impaction at the fracture site. Left knee replacement is noted. IMPRESSION: Comminuted distal femoral fracture as described. Electronically Signed   By: Alcide Clever M.D.   On: 08/03/2022 00:47    Review of Systems  HENT:  Negative for ear discharge,  ear pain, hearing loss and tinnitus.   Eyes:  Negative for photophobia and pain.  Respiratory:  Negative for cough and shortness of breath.   Cardiovascular:  Negative for chest pain.  Gastrointestinal:  Negative for abdominal pain, nausea and vomiting.  Genitourinary:  Negative for dysuria, flank pain, frequency and urgency.  Musculoskeletal:  Positive for arthralgias (Left knee). Negative for back pain, myalgias and neck pain.  Neurological:  Negative for dizziness and headaches.  Hematological:  Does not bruise/bleed easily.  Psychiatric/Behavioral:  The patient is not nervous/anxious.    Blood pressure 103/70, pulse 86, temperature 98.4 F (36.9 C), temperature source Oral, resp. rate 19, SpO2 98 %. Physical Exam Constitutional:      General: He is not in acute distress.    Appearance: He is well-developed. He is not diaphoretic.  HENT:     Head: Normocephalic and atraumatic.  Eyes:     General: No scleral icterus.       Right eye: No discharge.        Left eye: No discharge.     Conjunctiva/sclera: Conjunctivae normal.  Cardiovascular:     Rate and Rhythm: Normal rate and regular rhythm.  Pulmonary:     Effort: Pulmonary  effort is normal. No respiratory distress.  Musculoskeletal:     Cervical back: Normal range of motion.     Comments: LLE No traumatic wounds, ecchymosis, or rash  KI in place  No ankle effusion  Sens DPN, SPN, TN intact  Motor EHL, ext, flex, evers 5/5  DP 2+, PT 1+, 1+ NP edema  Skin:    General: Skin is warm and dry.  Neurological:     Mental Status: He is alert.  Psychiatric:        Mood and Affect: Mood normal.        Behavior: Behavior normal.     Assessment/Plan: Left distal femur fx -- Plan ORIF today with Dr. Jena Gauss. Please keep NPO. Multiple medical problems including obesity, anemia, hyperlipidemia, hypertension, diabetes, bradycardia, ethanol use, atrial fibrillation, GI bleed, pancreatic insufficiency -- per primary service    Freeman Caldron, PA-C Orthopedic Surgery [phone] 08/04/2022, 9:02 AM

## 2022-08-04 NOTE — Consult Note (Signed)
Reason for Consult:Left distal femur fx Referring Physician: Binaya Dahal Time called: 0730 Time at bedside: 0856   James Sanford is an 67 y.o. male.  HPI: James Sanford was at home and tripped and fell. He had immediate left knee pain and could not get up. He was brought to the ED at [facility] where x-rays showed a periprosthetic distal femur fx. Orthopedic surgery was consulted and recommended transfer to [facility] for definitive fixation. He lives at home with his wife and was not using any assistive devices to transfer.  Past Medical History:  Diagnosis Date   A-fib (HCC)    Anemia    Arthritis    Hyperlipemia    Hypertension    Memory deficit    Pre-diabetes    Sleep apnea     Past Surgical History:  Procedure Laterality Date   CATARACT EXTRACTION W/PHACO Right 03/09/2022   Procedure: CATARACT EXTRACTION PHACO AND INTRAOCULAR LENS PLACEMENT (IOC) RIGHT DIABETIC 8.98 01:09.1;  Surgeon: Hampton, Blake Maxwell, MD;  Location: [facility];  Service: Ophthalmology;  Laterality: Right;   COLONOSCOPY N/A 12/29/2021   Procedure: COLONOSCOPY;  Surgeon: Locklear, Cameron T, MD;  Location: [facility] ENDOSCOPY;  Service: Endoscopy;  Laterality: N/A;   COLONOSCOPY WITH PROPOFOL N/A 01/28/2020   Procedure: COLONOSCOPY WITH PROPOFOL;  Surgeon: Vanga, Rohini Reddy, MD;  Location: [facility] ENDOSCOPY;  Service: Gastroenterology;  Laterality: N/A;   COLONOSCOPY WITH PROPOFOL N/A 01/28/2020   Procedure: COLONOSCOPY WITH PROPOFOL;  Surgeon: Vanga, Rohini Reddy, MD;  Location: [facility] ENDOSCOPY;  Service: Gastroenterology;  Laterality: N/A;   ESOPHAGOGASTRODUODENOSCOPY N/A 12/29/2021   Procedure: ESOPHAGOGASTRODUODENOSCOPY (EGD);  Surgeon: Locklear, Cameron T, MD;  Location: [facility]OSCOPY;  Service: Endoscopy;  Laterality: N/A;   ESOPHAGOGASTRODUODENOSCOPY (EGD) WITH PROPOFOL N/A 01/28/2020   Procedure: ESOPHAGOGASTRODUODENOSCOPY (EGD) WITH PROPOFOL;  Surgeon: Vanga, Rohini Reddy, MD;  Location: [facility] ENDOSCOPY;   Service: Gastroenterology;  Laterality: N/A;   FLEXIBLE SIGMOIDOSCOPY N/A 04/06/2022   Procedure: FLEXIBLE SIGMOIDOSCOPY;  Surgeon: Locklear, Cameron T, MD;  Location: [facility] ENDOSCOPY;  Service: Endoscopy;  Laterality: N/A;   GASTRIC BYPASS     HERNIA REPAIR     INTRAMEDULLARY (IM) NAIL INTERTROCHANTERIC Left 11/25/2020   Procedure: INTRAMEDULLARY (IM) NAIL INTERTROCHANTRIC;  Surgeon: Bowers, James R, MD;  Location: [facility];  Service: Orthopedics;  Laterality: Left;   JOINT REPLACEMENT Bilateral    Knee surgery   OPEN REDUCTION INTERNAL FIXATION (ORIF) DISTAL RADIAL FRACTURE Right 10/30/2017   Procedure: OPEN REDUCTION INTERNAL FIXATION (ORIF) DISTAL RADIAL FRACTURE;  Surgeon: Durrani, Shakeel, MD;  Location: [facility];  Service: Orthopedics;  Laterality: Right;   ORIF FEMUR FRACTURE Right 01/29/2020   Procedure: OPEN REDUCTION INTERNAL FIXATION (ORIF) DISTAL FEMUR FRACTURE;  Surgeon: Poggi, John J, MD;  Location: [facility] ORS;  Service: Orthopedics;  Laterality: Right;    Family History  Problem Relation Age of Onset   Stroke Mother    Suicidality Father     Social History:  reports that he has never smoked. He has quit using smokeless tobacco.  His smokeless tobacco use included chew. He reports that he does not currently use alcohol. He reports that he does not currently use drugs.  Allergies:  Allergies  Allergen Reactions   Penicillins Diarrhea    TOLERATED CEFAZOLIN 11/25/20 Unknown childhood reaction    Medications: I have reviewed the patient's current medications.  Results for orders placed or performed during the hospital encounter of 08/03/22 (from the past 48 hour(s))  HIV Antibody (routine testing w rflx)       Status: None   Collection Time: 08/03/22  6:45 PM  Result Value Ref Range   HIV Screen 4th Generation wRfx Non Reactive Non Reactive    Comment: Performed at [facility], [address] [place], [place] [address]  Type and screen [facility]      Status: None   Collection Time: 08/04/22  7:37 AM  Result Value Ref Range   ABO/RH(D) A POS    Antibody Screen NEG    Sample Expiration      08/07/2022,2359 Performed at [facility], [address] [place], [place] [address]   CBC     Status: Abnormal   Collection Time: 08/04/22  7:38 AM  Result Value Ref Range   WBC 8.8 4.0 - 10.5 K/uL   RBC 3.38 (L) 4.22 - 5.81 MIL/uL   Hemoglobin 9.3 (L) 13.0 - 17.0 g/dL   HCT 30.0 (L) 39.0 - 52.0 %   MCV 88.8 80.0 - 100.0 fL   MCH 27.5 26.0 - 34.0 pg   MCHC 31.0 30.0 - 36.0 g/dL   RDW 17.2 (H) 11.5 - 15.5 %   Platelets 127 (L) 150 - 400 K/uL    Comment: REPEATED TO VERIFY   nRBC 0.0 0.0 - 0.2 %    Comment: Performed at [facility], [address] [place], [place] [address]  Basic metabolic panel     Status: Abnormal   Collection Time: 08/04/22  7:38 AM  Result Value Ref Range   Sodium 140 135 - 145 mmol/L   Potassium 3.6 3.5 - 5.1 mmol/L   Chloride 110 98 - 111 mmol/L   CO2 22 22 - 32 mmol/L   Glucose, Bld 117 (H) 70 - 99 mg/dL    Comment: Glucose reference range applies only to samples taken after fasting for at least 8 hours.   BUN 18 8 - 23 mg/dL   Creatinine, Ser 0.70 0.61 - 1.24 mg/dL   Calcium 8.2 (L) 8.9 - 10.3 mg/dL   GFR, Estimated >60 >60 mL/min    Comment: (NOTE) Calculated using the CKD-EPI Creatinine Equation (2021)    Anion gap 8 5 - 15    Comment: Performed at [facility], [address] [place], [place] [address]    DG Knee Complete 4 Views Left  Result Date: 08/03/2022 CLINICAL DATA:  Recent fall with left knee pain, initial encounter EXAM: LEFT KNEE - COMPLETE 4+ VIEW COMPARISON:  None Available. FINDINGS: Left knee prosthesis is noted. Comminuted distal femoral fracture is noted at the diaphyseal metaphyseal junction. Mild impaction at the fracture site is seen. Joint effusion is noted. Mild posterior angulation of the distal fracture fragments is seen. IMPRESSION: Distal left femoral  fracture with associated joint effusion. Electronically Signed   By: Mark  Lukens M.D.   On: 08/03/2022 00:48   DG FEMUR MIN 2 VIEWS LEFT  Result Date: 08/03/2022 CLINICAL DATA:  Recent fall with left leg pain, initial encounter EXAM: LEFT FEMUR 2 VIEWS COMPARISON:  12/17/2020 FINDINGS: Proximal left medullary rod is noted with fixation screw traversing the femoral neck. Healed fracture is noted. In the distal femur however there is a comminuted fracture involving the diaphyseal metaphyseal junction with some impaction at the fracture site. Left knee replacement is noted. IMPRESSION: Comminuted distal femoral fracture as described. Electronically Signed   By: Mark  Lukens M.D.   On: 08/03/2022 00:47    Review of Systems  HENT:  Negative for ear discharge,   ear pain, hearing loss and tinnitus.   Eyes:  Negative for photophobia and pain.  Respiratory:  Negative for cough and shortness of breath.   Cardiovascular:  Negative for chest pain.  Gastrointestinal:  Negative for abdominal pain, nausea and vomiting.  Genitourinary:  Negative for dysuria, flank pain, frequency and urgency.  Musculoskeletal:  Positive for arthralgias (Left knee). Negative for back pain, myalgias and neck pain.  Neurological:  Negative for dizziness and headaches.  Hematological:  Does not bruise/bleed easily.  Psychiatric/Behavioral:  The patient is not nervous/anxious.    Blood pressure 103/70, pulse 86, temperature 98.4 F (36.9 C), temperature source Oral, resp. rate 19, SpO2 98 %. Physical Exam Constitutional:      General: He is not in acute distress.    Appearance: He is well-developed. He is not diaphoretic.  HENT:     Head: Normocephalic and atraumatic.  Eyes:     General: No scleral icterus.       Right eye: No discharge.        Left eye: No discharge.     Conjunctiva/sclera: Conjunctivae normal.  Cardiovascular:     Rate and Rhythm: Normal rate and regular rhythm.  Pulmonary:     Effort: Pulmonary  effort is normal. No respiratory distress.  Musculoskeletal:     Cervical back: Normal range of motion.     Comments: LLE No traumatic wounds, ecchymosis, or rash  KI in place  No ankle effusion  Sens DPN, SPN, TN intact  Motor EHL, ext, flex, evers 5/5  DP 2+, PT 1+, 1+ NP edema  Skin:    General: Skin is warm and dry.  Neurological:     Mental Status: He is alert.  Psychiatric:        Mood and Affect: Mood normal.        Behavior: Behavior normal.     Assessment/Plan: Left distal femur fx -- Plan ORIF today with Dr. Haddix. Please keep NPO. Multiple medical problems including obesity, anemia, hyperlipidemia, hypertension, diabetes, bradycardia, ethanol use, atrial fibrillation, GI bleed, pancreatic insufficiency -- per primary service    Selma Mink J. Rashun Grattan, PA-C Orthopedic Surgery [phone] 08/04/2022, 9:02 AM  

## 2022-08-04 NOTE — Progress Notes (Signed)
PROGRESS NOTE  James Sanford  DOB: 02-Dec-1955  PCP: Jerl Mina, MD ZHY:[id number]  DOA: 08/03/2022  LOS: 1 day  Hospital Day: 2  Brief narrative: James Sanford is a 67 y.o. male with PMH significant for prediabetes, HTN, HLD, OSA, A-fib on Eliquis., chronic alcoholism, h/o GI bleeding, chronic anemia, pancreatic insufficiency. 4/30, patient presented to the ED at [facility] after a fall and a leg injury. Per history, patient slipped and fell while walking up the stairs left leg followed by immediate significant pain. He denies hitting his head nor loss of consciousness.   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  Seen by orthopedics.  Given complex peri prosthetic fracture, patient was transferred to [facility] for surgical management. Seen by orthopedics Dr. Jena Gauss 5/1, underwent ORIF of left distal femur fracture  Subjective: Patient was seen and examined this afternoon. Underwent surgical fixation this morning.  Not in distress.  Pain controlled. No family at bedside. Chart reviewed Hemodynamically stable Last set of labs from this morning with hemoglobin down to 9.3  Assessment and plan: Periprosthetic left femur fracture S/p ORIF - 5/1 Dr. Jena Gauss Secondary to mechanical fall  Imaging as above.  Procedure as above. Eliquis on hold. Continue pain management.  Currently on scheduled Tylenol, Norco PRN and Dilaudid PRN Plan resumption of Eliquis postprocedure after orthopedic clearance.  Chronic anemia H/o GI bleeding It seems her baseline hemoglobin hovers around 10 Continue to monitor. Recent Labs    03/05/22 2231 08/03/22 0130 08/04/22 0738  HGB 9.9* 10.6* 9.3*  MCV 88.4 90.6 88.8   A-fib Not on any AV nodal blocking agent due to  history of bradycardia Chronically on Eliquis.  Currently on hold for surgery Plan resumption of Eliquis postprocedure after orthopedic clearance.   H/o HTN, HLD, prediabetes Not currently on any medication for these issues.  Continue to monitor   Chronic alcoholism Fatty liver No mention of cirrhosis in previous imaging from September 2023. ???  Xifaxan  Pancreatic insufficiency Seen recently for this with associated loose stool. Continue home Creon   Eye drops > On multiple eyedrops for problems not listed in our system. -Will work with pharmacy to hopefully confirm medications, was able to confirm prednisolone with the patient, his wife will be bringing them in when she visits him.   OSA  Impaired mobility Needs PT eval postprocedure   Goals of care   Code Status: Full Code     DVT prophylaxis: Plan to resume Eliquis after clearance by surgery SCDs Start: 08/04/22 1308   Antimicrobials: None Fluid: Start NS at 75 mill per hour Consultants: Orthopedics Family Communication: None at bedside  Status: Inpatient Level of care:  Telemetry Surgical   Patient from: Home Anticipated d/c to: Pending PT eval Needs to continue in-hospital care:  POD 0, pending PT eval   Diet:  Diet Order             Diet Heart Room service appropriate? Yes; Fluid consistency: Thin  Diet effective now                   Scheduled Meds:  docusate sodium  100 mg Oral BID   lipase/protease/amylase  24,000 Units Oral TID AC   prednisoLONE acetate  1 drop Right Eye QID    PRN meds: [START  ON 08/05/2022] acetaminophen, HYDROcodone-acetaminophen, HYDROcodone-acetaminophen, HYDROmorphone (DILAUDID) injection, methocarbamol **OR** methocarbamol (ROBAXIN) IV, metoCLOPramide **OR** metoCLOPramide (REGLAN) injection, ondansetron **OR** ondansetron (ZOFRAN) IV, polyethylene glycol   Infusions:   sodium chloride 75 mL/hr at 08/04/22 1346    ceFAZolin (ANCEF) IV 2 g (08/04/22 1348)    methocarbamol (ROBAXIN) IV      Antimicrobials: Anti-infectives (From admission, onward)    Start     Dose/Rate Route Frequency Ordered Stop   08/04/22 1400  ceFAZolin (ANCEF) IVPB 2g/100 mL premix        2 g 200 mL/hr over 30 Minutes Intravenous Every 8 hours 08/04/22 1307 08/05/22 1359   08/04/22 1136  vancomycin (VANCOCIN) powder  Status:  Discontinued          As needed 08/04/22 1137 08/04/22 1206   08/04/22 0945  ceFAZolin (ANCEF) IVPB 2g/100 mL premix        2 g 200 mL/hr over 30 Minutes Intravenous On call to O.R. 08/04/22 0937 08/04/22 1347       Nutritional status:  Body mass index is 21.7 kg/m.          Objective: Vitals:   08/04/22 1245 08/04/22 1316  BP: 102/76 107/74  Pulse: 71   Resp: 12 12  Temp: 97.7 F (36.5 C) 98.1 F (36.7 C)  SpO2: 97% 99%    Intake/Output Summary (Last 24 hours) at 08/04/2022 1430 Last data filed at 08/04/2022 1131 Gross per 24 hour  Intake 1050 ml  Output 800 ml  Net 250 ml   Filed Weights   08/04/22 0928  Weight: 72.6 kg   Weight change:  Body mass index is 21.7 kg/m.   Physical Exam: General exam: Pleasant, not in distress Skin: No rashes, lesions or ulcers. HEENT: Atraumatic, normocephalic, no obvious bleeding Lungs: Clear to auscultation bilaterally CVS: Regular rate and rhythm, no murmur GI/Abd soft, nontender, nondistended, bowel sound present CNS: Alert, awake, oriented x 3 Psychiatry: Mood appropriate Extremities: No pedal edema, no calf tenderness  Data Review: I have personally reviewed the laboratory data and studies available.  F/u labs ordered Unresulted Labs (From admission, onward)     Start     Ordered   08/05/22 0500  VITAMIN D 25 Hydroxy (Vit-D Deficiency, Fractures)  Tomorrow morning,   R        08/04/22 1307   08/05/22 0500  CBC  Daily,   R      08/04/22 1307   08/05/22 0500  Basic metabolic panel  Tomorrow morning,   R        08/04/22 1307            Total time spent in review  of labs and imaging, patient evaluation, formulation of plan, documentation and communication with family: 45 minutes  Signed, Lorin Glass, MD [facility] 08/04/2022

## 2022-08-04 NOTE — Anesthesia Preprocedure Evaluation (Addendum)
Anesthesia Evaluation  Patient identified by MRN, date of birth, ID band Patient awake    Reviewed: Allergy & Precautions, H&P , NPO status , Patient's Chart, lab work & pertinent test results  Airway Mallampati: II  TM Distance: >3 FB Neck ROM: Full    Dental no notable dental hx.    Pulmonary sleep apnea    Pulmonary exam normal breath sounds clear to auscultation       Cardiovascular hypertension, +CHF  Normal cardiovascular exam+ dysrhythmias Atrial Fibrillation  Rhythm:Regular Rate:Normal  Left ventricle:   Wall thickness was increased in a pattern of mild  LVH.   Systolic function was moderately reduced. The estimated  ejection fraction was in the range of 35% to 40%. Doppler  parameters are consistent with abnormal left ventricular relaxation  (grade 1 diastolic dysfunction).   -------------------------------------------------------------------  Aortic valve:   Trileaflet.  Doppler:   There was no stenosis.  There was no significant regurgitation.   -------------------------------------------------------------------  Mitral valve:   Doppler:  There was trivial regurgitation.   -------------------------------------------------------------------  Left atrium:  The atrium was mildly dilated.   -------------------------------------------------------------------  Right ventricle:  The cavity size was normal. Systolic function was  normal.   -------------------------------------------------------------------  Tricuspid valve:   Doppler:  There was mild regurgitation.   -------------------------------------------------------------------  Right atrium:  The atrium was mildly dilated.      Neuro/Psych negative neurological ROS  negative psych ROS   GI/Hepatic negative GI ROS,,,(+)     substance abuse  alcohol use  Endo/Other  diabetes, Type 2    Renal/GU negative Renal ROS  negative genitourinary    Musculoskeletal negative musculoskeletal ROS (+)    Abdominal   Peds negative pediatric ROS (+)  Hematology  (+) Blood dyscrasia, anemia   Anesthesia Other Findings   Reproductive/Obstetrics negative OB ROS                             Anesthesia Physical Anesthesia Plan  ASA: 3  Anesthesia Plan: General   Post-op Pain Management: Regional block*   Induction: Intravenous  PONV Risk Score and Plan: 2 and Ondansetron, Dexamethasone and Treatment may vary due to age or medical condition  Airway Management Planned: Oral ETT  Additional Equipment:   Intra-op Plan:   Post-operative Plan: Extubation in OR  Informed Consent: I have reviewed the patients History and Physical, chart, labs and discussed the procedure including the risks, benefits and alternatives for the proposed anesthesia with the patient or authorized representative who has indicated his/her understanding and acceptance.     Dental advisory given  Plan Discussed with: CRNA and Surgeon  Anesthesia Plan Comments:        Anesthesia Quick Evaluation

## 2022-08-04 NOTE — Op Note (Signed)
Orthopaedic Surgery Operative Note (CSN: [id number] ) Date of Surgery: 08/04/2022  Admit Date: 08/03/2022   Diagnoses: Pre-Op Diagnoses: Left periprosthetic distal femur fracture  Post-Op Diagnosis: Same  Procedures: CPT 27511-Open reduction internal fixation of left distal femur fracture CPT 20680-Removal of hardware from left femur  Surgeons : Primary: Roby Lofts, MD  Assistant: Ulyses Southward, PA-C  Location: OR 3   Anesthesia:General   Antibiotics: Ancef 2g preop with 1 gm vancomycin powder placed topically   Tourniquet time: None    Estimated Blood Loss: 75 mL  Complications:None  Specimens:* No specimens in log *   Implants: Implant Name Type Inv. Item Serial No. Manufacturer Lot No. LRB No. Used Action  SCREW CORTICAL 5.0X95MM - [id number] Screw SCREW CORTICAL 5.0X95MM  [employer](ORTH,TRAU,BIO,SG) [id number] Left 1 Implanted  SCREW CORTICAL NCB 5.0X90MM - [id number] Screw SCREW CORTICAL NCB 5.0X90MM  [employer](ORTH,TRAU,BIO,SG) [id number] Left 1 Implanted  PLATE NCB 62X HIP - [id number] Plate PLATE NCB 10U HIP  [employer](ORTH,TRAU,BIO,SG)  Left 1 Implanted  SCREW NCB 5.0X46MM - [id number] Screw SCREW NCB 5.0X46MM  [employer](ORTH,TRAU,BIO,SG)  Left 1 Implanted  SCREW CORTICAL NCB 5.0X44 - [id number] Screw SCREW CORTICAL NCB 5.0X44  [employer](ORTH,TRAU,BIO,SG)  Left 1 Implanted  SCREW CORTICAL 5.0X95MM - [id number] Screw SCREW CORTICAL 5.0X95MM  [employer](ORTH,TRAU,BIO,SG) [id number] Left 1 Implanted  SCREW CORTICAL 5.0X95MM - [id number] Screw SCREW CORTICAL 5.0X95MM  [employer](ORTH,TRAU,BIO,SG) [id number] Left 1 Implanted  SCREW CORTICAL NCB 5.0X90MM - [id number] Screw SCREW CORTICAL NCB 5.0X90MM  [employer](ORTH,TRAU,BIO,SG) [id number] Left 1 Implanted  SCREW 5.0 - [id number] Screw SCREW 5.0  [employer](ORTH,TRAU,BIO,SG)  Left 1 Implanted  SCREW CORTICAL NCB 5.0X65 - [id number] Screw SCREW CORTICAL NCB 5.0X65  [employer](ORTH,TRAU,BIO,SG)  Left  1 Implanted  SCREW NCB 4.6EV03J - [id number] Screw SCREW NCB 4.3ZJ69C  [employer](ORTH,TRAU,BIO,SG)  Left 1 Implanted  SCREW NCB 4.0MX42M - [id number] Screw SCREW NCB 4.0MX42M  [employer](ORTH,TRAU,BIO,SG)  Left 1 Implanted  CAP LOCK NCB - [id number] Cap CAP LOCK NCB  [employer](ORTH,TRAU,BIO,SG)  Left 9 Implanted     Indications for Surgery: 67 year old male who sustained a left periprosthetic distal femur fracture.  Due to the unstable nature of his injury I recommend proceeding with open reduction internal fixation.  Risk and benefits were discussed with the patient and his wife.  Risks included but not limited to bleeding, infection, malunion, nonunion, hardware failure, hardware irritation, nerve or blood vessel injury, DVT, knee stiffness, even the possible anesthetic complications.  They agreed to proceed with surgery and consent was obtained.  Operative Findings: 1.  Open reduction internal fixation of left periprosthetic distal femur fracture using [employer] NCB distal femoral locking plate. 2.  Removal of distal locking screw from previous hip nail  Procedure: The patient was identified in the preoperative holding area. Consent was confirmed with the patient and their family and all questions were answered. The operative extremity was marked after confirmation with the patient. he was then brought back to the operating room by our anesthesia colleagues.  He was placed under general anesthetic and carefully transferred over to radiolucent flattop table.  A bump was placed under his operative hip.  The left lower extremity was then prepped and draped in usual sterile fashion.  A timeout was performed to verify the patient, the procedure, and the extremity.  Preoperative antibiotics were dosed.  Fluoroscopic imaging was obtained to show the unstable nature of his injury.  The hip and knee were flexed over a triangle and  the fracture was aligned appropriately.  I then percutaneously  removed the distal interlocking screw from his previous hip nail that was placed in 2022.  I then made a lateral approach to his distal femur and carried it down through skin and subcutaneous tissue.  I incised through the IT band and I exposed the lateral condyle.  I developed the path underneath the vastus lateralis for the plate placement.  I then attached a 15 hole [employer] NCB distal femoral locking plate to a targeting arm and slid this submuscularly along the lateral cortex of the femur.  I placed a 2.0 mm K wire distally to align the distal portion of the plate.  I then placed a 3.3 mm drill bit in the femoral shaft to align the proximal portion of the plate.  I then proceeded to drill and placed 5.0 millimeter screws distally to bring the distal portion of plate flush to bone.  I then proceeded to place 5.0 millimeter screws proximally in the femoral shaft bicortically.  2 of the screws were placed just posterior to the previous hip nail.  I removed the 3.3 mm drill bit and placed a 4.0 millimeter screw.  I placed locking caps on the distal 3 femoral shaft screws.  I then removed the targeting arm and proceeded to place 5.0 millimeter screws in the distal segment.  A bone hook was used to grasp the medial spike of the metaphysis and a screw was placed here to hold this reduction.  Locking caps were placed on all of the distal screws.  Final fluoroscopic imaging was obtained.  The incisions were copiously irrigated.  A gram of vancomycin powder was placed into the incision.  A layered closure of 0 Vicryl, 2-0 Vicryl and 3-0 Monocryl with Dermabond was used to close the skin.  Sterile dressings were applied.  The patient was then awoke from anesthesia and taken to the PACU in stable condition.  Post Op Plan/Instructions: Patient will be weightbearing as tolerated to the left lower extremity.  He will receive postoperative Ancef.  He will be restarted on his Eliquis once his hemoglobin is stabilized.   Will have him mobilize with physical and Occupational Therapy.  I was present and performed the entire surgery.  Ulyses Southward, PA-C did assist me throughout the case. An assistant was necessary given the difficulty in approach, maintenance of reduction and ability to instrument the fracture.   Truitt Merle, MD [facility]

## 2022-08-04 NOTE — Anesthesia Procedure Notes (Signed)
Anesthesia Regional Block: Femoral nerve block   Pre-Anesthetic Checklist: , timeout performed,  Correct Patient, Correct Site, Correct Laterality,  Correct Procedure, Correct Position, site marked,  Risks and benefits discussed,  Surgical consent,  Pre-op evaluation,  At surgeon's request and post-op pain management  Laterality: Left  Prep: chloraprep       Needles:  Injection technique: Single-shot  Needle Type: Echogenic Needle     Needle Length: 9cm      Additional Needles:   Procedures:,,,, ultrasound used (permanent image in chart),,    Narrative:  Start time: 08/04/2022 9:58 AM End time: 08/04/2022 10:07 AM Injection made incrementally with aspirations every 5 mL.  Performed by: Personally  Anesthesiologist: Eilene Ghazi, MD  Additional Notes: Patient tolerated the procedure well without complications

## 2022-08-04 NOTE — Care Management (Signed)
  Transition of Care [facility]) Screening Note   Patient Details  Name: James Sanford Date of Birth: [birth year]   Transition of Care [facility]) CM/SW Contact:    Lawerance Sabal, RN Phone Number: 08/04/2022, 7:56 AM    Transition of Care Department [facility]) has reviewed patient and we will continue to monitor patient advancement through interdisciplinary progression rounds.   ORIF Femur planned for today per notes.  Patient admitted from home after fall.  TOC will follow post op PT OT evals and consult with patient for DC planning.

## 2022-08-04 NOTE — Anesthesia Procedure Notes (Signed)
Procedure Name: Intubation Date/Time: 08/04/2022 10:44 AM  Performed by: Shary Decamp, CRNAPre-anesthesia Checklist: Patient identified, Patient being monitored, Timeout performed, Emergency Drugs available and Suction available Patient Re-evaluated:Patient Re-evaluated prior to induction Oxygen Delivery Method: Circle system utilized Preoxygenation: Pre-oxygenation with 100% oxygen Induction Type: IV induction Ventilation: Mask ventilation without difficulty and Oral airway inserted - appropriate to patient size Laryngoscope Size: Hyacinth Meeker and 2 Grade View: Grade I Tube type: Oral Tube size: 7.5 mm Number of attempts: 1 Airway Equipment and Method: Stylet Placement Confirmation: ETT inserted through vocal cords under direct vision, positive ETCO2 and breath sounds checked- equal and bilateral Secured at: 22 cm Tube secured with: Tape Dental Injury: Teeth and Oropharynx as per pre-operative assessment

## 2022-08-05 ENCOUNTER — Encounter (HOSPITAL_COMMUNITY): Payer: Self-pay | Admitting: Student

## 2022-08-05 DIAGNOSIS — Z96649 Presence of unspecified artificial hip joint: Secondary | ICD-10-CM | POA: Diagnosis not present

## 2022-08-05 DIAGNOSIS — M978XXA Periprosthetic fracture around other internal prosthetic joint, initial encounter: Secondary | ICD-10-CM | POA: Diagnosis not present

## 2022-08-05 LAB — BASIC METABOLIC PANEL
Anion gap: 4 — ABNORMAL LOW (ref 5–15)
BUN: 23 mg/dL (ref 8–23)
CO2: 26 mmol/L (ref 22–32)
Calcium: 8.4 mg/dL — ABNORMAL LOW (ref 8.9–10.3)
Chloride: 110 mmol/L (ref 98–111)
Creatinine, Ser: 0.72 mg/dL (ref 0.61–1.24)
GFR, Estimated: >60 mL/min (ref >60)
Glucose, Bld: 165 mg/dL — ABNORMAL HIGH (ref 70–99)
Potassium: 4.4 mmol/L (ref 3.5–5.1)
Sodium: 140 mmol/L (ref 135–145)

## 2022-08-05 LAB — CBC
HCT: 27.1 % — ABNORMAL LOW (ref 39.0–52.0)
Hemoglobin: 8.5 g/dL — ABNORMAL LOW (ref 13.0–17.0)
MCH: 28 pg (ref 26.0–34.0)
MCHC: 31.4 g/dL (ref 30.0–36.0)
MCV: 89.1 fL (ref 80.0–100.0)
Platelets: 126 10*3/uL — ABNORMAL LOW (ref 150–400)
RBC: 3.04 MIL/uL — ABNORMAL LOW (ref 4.22–5.81)
RDW: 17.2 % — ABNORMAL HIGH (ref 11.5–15.5)
WBC: 8.7 10*3/uL (ref 4.0–10.5)
nRBC: 0 % (ref 0.0–0.2)

## 2022-08-05 LAB — VITAMIN D 25 HYDROXY (VIT D DEFICIENCY, FRACTURES): Vit D, 25-Hydroxy: 7.88 ng/mL — ABNORMAL LOW (ref 30–100)

## 2022-08-05 MED ORDER — VITAMIN D (ERGOCALCIFEROL) 1.25 MG (50000 UNIT) PO CAPS
50000.0000 [IU] | ORAL_CAPSULE | ORAL | Status: DC
  Administered 2022-08-05: 50000 [IU] via ORAL
  Filled 2022-08-05: qty 1

## 2022-08-05 MED ORDER — APIXABAN 5 MG PO TABS
5.0000 mg | ORAL_TABLET | Freq: Two times a day (BID) | ORAL | Status: DC
  Administered 2022-08-05 – 2022-08-11 (×13): 5 mg via ORAL
  Filled 2022-08-05 (×13): qty 1

## 2022-08-05 MED ORDER — HYDROCODONE-ACETAMINOPHEN 7.5-325 MG PO TABS
1.0000 | ORAL_TABLET | Freq: Four times a day (QID) | ORAL | Status: DC | PRN
  Administered 2022-08-06: 1 via ORAL
  Administered 2022-08-06: 2 via ORAL
  Administered 2022-08-07 (×2): 1 via ORAL
  Filled 2022-08-05 (×2): qty 1
  Filled 2022-08-05: qty 2
  Filled 2022-08-05: qty 1

## 2022-08-05 NOTE — Progress Notes (Signed)
ANTICOAGULATION CONSULT NOTE - Initial Consult  Pharmacy Consult for apixaban Indication: atrial fibrillation  Allergies  Allergen Reactions   Penicillins Diarrhea    TOLERATED CEFAZOLIN 11/25/20 Unknown childhood reaction    Patient Measurements: Height: 6' (182.9 cm) Weight: 72.6 kg (160 lb) IBW/kg (Calculated) : 77.6  Vital Signs: Temp: 98 F (36.7 C) (05/02 0419) Temp Source: Oral (05/02 0001) BP: 103/74 (05/02 0419) Pulse Rate: 65 (05/02 0419)  Labs: Recent Labs    08/03/22 0130 08/04/22 0738 08/05/22 0510  HGB 10.6* 9.3* 8.5*  HCT 34.8* 30.0* 27.1*  PLT 148* 127* 126*  APTT 34  --   --   LABPROT 14.7  --   --   INR 1.2  --   --   CREATININE 0.71 0.70 0.72    Estimated Creatinine Clearance: 94.5 mL/min (by C-G formula based on SCr of 0.72 mg/dL).   Medical History: Past Medical History:  Diagnosis Date   A-fib (HCC)    Anemia    Arthritis    Hyperlipemia    Hypertension    Memory deficit    Pre-diabetes    Sleep apnea     Medications:  Medications Prior to Admission  Medication Sig Dispense Refill Last Dose   ACULAR 0.5 % ophthalmic solution Place 1 drop into the right eye 4 (four) times daily.   08/04/2022   CALTRATE 600+D3 SOFT 600-20 MG-MCG CHEW Chew 1 tablet by mouth daily.   08/01/2022   Cholecalciferol (VITAMIN D) 50 MCG (2000 UT) CAPS Take 1 capsule by mouth daily.   Past Week   CREON 24000-76000 units CPEP Take 2 capsules by mouth 4 (four) times daily.   08/04/2022   D3-1000 25 MCG (1000 UT) capsule Take 1,000 Units by mouth every morning.   08/01/2022   ELIQUIS 5 MG TABS tablet Take 5 mg by mouth 2 (two) times daily.   08/01/2022 at 1000   ILEVRO 0.3 % ophthalmic suspension Place 1 drop into the right eye daily.   08/04/2022   Multiple Vitamin (MULTIVITAMIN WITH MINERALS) TABS tablet Take 1 tablet by mouth daily.   Past Week   omega-3 acid ethyl esters (LOVAZA) 1 g capsule Take 1 g by mouth daily.   Past Week   prednisoLONE acetate (PRED FORTE) 1  % ophthalmic suspension Place 1 drop into the right eye 4 (four) times daily.   08/04/2022   rifaximin (XIFAXAN) 550 MG TABS tablet Take 550 mg by mouth 3 (three) times daily.   08/01/2022   VIGAMOX 0.5 % ophthalmic solution Place 1 drop into the right eye 4 (four) times daily.   08/04/2022    Assessment: 67 YOM POD#1(ORIF L distal femur fracture) requiring restart of apixaban if Hgb >/= 8 G/dL  Goal of Therapy:  Monitor platelets by anticoagulation protocol: Yes   Plan:  Restart apixaban 5 mg po bid Monitor for signs of bleeding Pharmacy will sign off protocol but continue to monitor making recommendations prn Thank you  Greta Doom BS, PharmD, BCPS Clinical Pharmacist 08/05/2022 7:13 AM  Contact: [phone] after 3 PM  "Be curious, not judgmental..." -Debbora Dus

## 2022-08-05 NOTE — Progress Notes (Signed)
PROGRESS NOTE  James Sanford  DOB: [birth year]  PCP: Jerl Mina, MD QVZ:[id number]  DOA: 08/03/2022  LOS: 2 days  Hospital Day: 3  Brief narrative: James Sanford is a 67 y.o. male with PMH significant for prediabetes, HTN, HLD, OSA, A-fib on Eliquis., chronic alcoholism, h/o GI bleeding, chronic anemia, pancreatic insufficiency. 4/30, patient presented to the ED at [facility] after a fall and a leg injury. Per history, patient slipped and fell while walking up the stairs left leg followed by immediate significant pain. He denies hitting his head nor loss of consciousness.   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  Seen by orthopedics.  Given complex peri prosthetic fracture, patient was transferred to [facility] for surgical management. Seen by orthopedics Dr. Jena Gauss 5/1, underwent ORIF of left distal femur fracture  Subjective: Patient was seen and examined this morning.  Sitting up in bed.  Not in distress.  No new symptoms. Slightly down today.  No active bleeding elsewhere.  Assessment and plan: Periprosthetic left femur fracture S/p ORIF - 5/1 Dr. Jena Gauss Secondary to mechanical fall  Imaging as above.  Procedure as above. Continue pain management.  Currently on scheduled Tylenol, Norco PRN and Dilaudid PRN Eliquis resumed this morning  Chronic anemia H/o GI bleeding It seems her baseline hemoglobin hovers around 10 Postop, hemoglobin down to 8.5 today.  Likely secondary to blood loss from long bone fracture and surgery.  No bleeding ulcer.  Continue to monitor. Recent Labs    03/05/22 2231 08/03/22 0130 08/04/22 0738 08/05/22 0510  HGB 9.9* 10.6* 9.3* 8.5*  MCV 88.4 90.6 88.8 89.1    A-fib Not on any AV nodal blocking agent due to  history of bradycardia Chronically on Eliquis.  Resumed.  H/o HTN, HLD, prediabetes Not currently on any medication for these issues.  Continue to monitor   Chronic alcoholism Fatty liver No mention of cirrhosis in previous imaging from September 2023. Unclear why patient was on Xifaxan at home before.  Pancreatic insufficiency Seen recently for this with associated loose stool. Continue home Creon   Eye drops > On multiple eyedrops for problems not listed in our system. -Will work with pharmacy to hopefully confirm medications, was able to confirm prednisolone with the patient, his wife will be bringing them in when she visits him.   OSA  Impaired mobility PT eval obtained.  Formal PT recommended.   Goals of care   Code Status: Full Code     DVT prophylaxis:  Place and maintain sequential compression device Start: 08/05/22 0810 SCDs Start: 08/04/22 1308 apixaban (ELIQUIS) tablet 5 mg   Antimicrobials: None Fluid: Can stop IV fluid today. Consultants: Orthopedics Family Communication: None at bedside  Status: Inpatient Level of care:  Telemetry Surgical   Patient from: Home Anticipated d/c to: If hemoglobin stable, likely discharge home tomorrow.    Diet:  Diet Order             Diet Heart Room service appropriate? Yes; Fluid consistency: Thin  Diet effective now                   Scheduled Meds:  apixaban  5 mg Oral BID   docusate sodium  100 mg Oral BID   lipase/protease/amylase  24,000 Units Oral TID AC   prednisoLONE  acetate  1 drop Right Eye QID    PRN meds: acetaminophen, HYDROcodone-acetaminophen, HYDROcodone-acetaminophen, HYDROmorphone (DILAUDID) injection, methocarbamol **OR** methocarbamol (ROBAXIN) IV, metoCLOPramide **OR** metoCLOPramide (REGLAN) injection, ondansetron **OR** ondansetron (ZOFRAN) IV, polyethylene glycol   Infusions:   sodium chloride 75 mL/hr at 08/04/22 1346   methocarbamol (ROBAXIN) IV       Antimicrobials: Anti-infectives (From admission, onward)    Start     Dose/Rate Route Frequency Ordered Stop   08/04/22 1400  ceFAZolin (ANCEF) IVPB 2g/100 mL premix        2 g 200 mL/hr over 30 Minutes Intravenous Every 8 hours 08/04/22 1307 08/05/22 0909   08/04/22 1136  vancomycin (VANCOCIN) powder  Status:  Discontinued          As needed 08/04/22 1137 08/04/22 1206   08/04/22 0945  ceFAZolin (ANCEF) IVPB 2g/100 mL premix        2 g 200 mL/hr over 30 Minutes Intravenous On call to O.R. 08/04/22 0937 08/04/22 1347       Nutritional status:  Body mass index is 21.7 kg/m.          Objective: Vitals:   08/05/22 0734 08/05/22 1220  BP: 103/75 110/86  Pulse: (!) 58 65  Resp: 17 16  Temp: 98.4 F (36.9 C) 97.8 F (36.6 C)  SpO2: 99%     Intake/Output Summary (Last 24 hours) at 08/05/2022 1339 Last data filed at 08/05/2022 0000 Gross per 24 hour  Intake --  Output 500 ml  Net -500 ml    Filed Weights   08/04/22 0928  Weight: 72.6 kg   Weight change:  Body mass index is 21.7 kg/m.   Physical Exam: General exam: Pleasant, not in distress Skin: No rashes, lesions or ulcers. HEENT: Atraumatic, normocephalic, no obvious bleeding Lungs: Clear to auscultation bilaterally CVS: Regular rate and rhythm, no murmur GI/Abd soft, nontender, nondistended, bowel sound present CNS: Alert, awake, oriented x 3 Psychiatry: Mood appropriate Extremities: No pedal edema, no calf tenderness  Data Review: I have personally reviewed the laboratory data and studies available.  F/u labs ordered Unresulted Labs (From admission, onward)     Start     Ordered   08/05/22 0500  CBC  Daily,   R      08/04/22 1307            Total time spent in review of labs and imaging, patient evaluation, formulation of plan, documentation and communication with family: 45 minutes  Signed, Lorin Glass, MD [facility] 08/05/2022

## 2022-08-05 NOTE — TOC Initial Note (Addendum)
Transition of Care [facility]) - Initial/Assessment Note    Patient Details  Name: James Sanford MRN: [id number] Date of Birth: 06-Apr-1955  [facility]) CM/SW Contact:    Lorri Frederick, LCSW Phone Number: 08/05/2022, 3:22 PM  Clinical Narrative:    Pt informed by PT that pt wife leaving the country and not able to assist pt at home for DC home with [facility] as recommended yesterday.  CSW met with pt and wife James Sanford.  Difficult to discern exactly what is happening, some clear tension present.  Wife states pt has had multiple falls and she would like him to go to SNF, also asks if CIR is option.  Pt states that he does not agree with this and is planning to DC home.  Pt and wife from home together, no current services.  Wife somewhat vague about when she is leaving the county but says she is also "gone a lot" and concerned about pt falling frequently.  Pt was at SNF previously, did not have good experience, is not inclined to return.  Talked about SNF vs HH, CSW agreed will reach out to have CIR screen pt as well.  They agreed to talk more about the DC options. At the end, pt said he would think about willingness to go to SNF.               Expected Discharge Plan:  (TBD) Barriers to Discharge: Continued Medical Work up   Patient Goals and CMS Choice Patient states their goals for this hospitalization and ongoing recovery are:: live as long as I can          Expected Discharge Plan and Services In-house Referral: Clinical Social Work   Post Acute Care Choice:  (TBD) Living arrangements for the past 2 months: Single Family Home                                      Prior Living Arrangements/Services Living arrangements for the past 2 months: Single Family Home Lives with:: Spouse Patient language and need for interpreter reviewed:: Yes Do you feel safe going back to the place where you live?: Yes      Need for Family Participation in Patient Care: No  (Comment) Care giver support system in place?: Yes (comment) Current home services: Other (comment) (none) Criminal Activity/Legal Involvement Pertinent to Current Situation/Hospitalization: No - Comment as needed  Activities of Daily Living   ADL Screening (condition at time of admission) Patient's cognitive ability adequate to safely complete daily activities?: Yes Is the patient deaf or have difficulty hearing?: No Does the patient have difficulty seeing, even when wearing glasses/contacts?: Yes (cataracts) Does the patient have difficulty concentrating, remembering, or making decisions?: No Patient able to express need for assistance with ADLs?: Yes Does the patient have difficulty dressing or bathing?: No Independently performs ADLs?: Yes (appropriate for developmental age) Does the patient have difficulty walking or climbing stairs?: No Weakness of Legs: None Weakness of Arms/Hands: None  Permission Sought/Granted                  Emotional Assessment Appearance:: Appears stated age Attitude/Demeanor/Rapport: Engaged Affect (typically observed): Irritable Orientation: : Oriented to Self, Oriented to Place, Oriented to  Time, Oriented to Situation      Admission diagnosis:  Periprosthetic fracture of shaft of femur [W09.8XXA, Z96.649] Patient Active Problem List   Diagnosis Date  Noted   Femoral fracture (HCC) 08/03/2022   Periprosthetic fracture of shaft of femur 08/03/2022   Alcohol abuse with alcohol-induced mood disorder (HCC) 03/06/2022   Dehydration    AKI (acute kidney injury) (HCC) 03/04/2021   Herpes zoster ophthalmicus of right eye 03/03/2021   Alcohol use disorder, moderate, dependence (HCC) 03/03/2021   Lactic acidosis 03/03/2021   Iron deficiency anemia    Hypokalemia    Diarrhea    Impaired fasting glucose    Malnutrition of moderate degree 12/02/2020   Protein-calorie malnutrition, severe 11/25/2020   Closed nondisplaced intertrochanteric  fracture of left femur (HCC) 11/24/2020   Acute lower UTI 11/24/2020   Hyperlipemia    Acute GI bleeding 01/25/2020   Diabetes mellitus type 2, uncomplicated (HCC) 01/25/2020   Hyperlipidemia 01/25/2020   Obesity 01/25/2020   Essential hypertension 01/25/2020   Sleep apnea 01/25/2020   Alcohol abuse 03/07/2019   Cardiomyopathy, idiopathic (HCC) 03/07/2019   AF (paroxysmal atrial fibrillation) (HCC) 11/07/2017   Radius fracture 10/29/2017   PCP:  Jerl Mina, MD Pharmacy:   [facility] [id number] [place], [place] - [address] [place] [address] Phone: [phone] Fax: [phone]     Social Determinants of Health (SDOH) Social History: SDOH Screenings   Tobacco Use: Medium Risk (08/04/2022)   SDOH Interventions:     Readmission Risk Interventions     No data to display

## 2022-08-05 NOTE — Anesthesia Postprocedure Evaluation (Signed)
Anesthesia Post Note  Patient: James Sanford  Procedure(s) Performed: OPEN REDUCTION INTERNAL FIXATION (ORIF) DISTAL FEMUR FRACTURE (Left)     Patient location during evaluation: PACU Anesthesia Type: General Level of consciousness: awake and alert Pain management: pain level controlled Vital Signs Assessment: post-procedure vital signs reviewed and stable Respiratory status: spontaneous breathing, nonlabored ventilation, respiratory function stable and patient connected to nasal cannula oxygen Cardiovascular status: blood pressure returned to baseline and stable Postop Assessment: no apparent nausea or vomiting Anesthetic complications: no   No notable events documented.  Last Vitals:  Vitals:   08/05/22 0419 08/05/22 0734  BP: 103/74 103/75  Pulse: 65 (!) 58  Resp: 17 17  Temp: 36.7 C 36.9 C  SpO2: 97% 99%    Last Pain:  Vitals:   08/05/22 0734  TempSrc: Oral  PainSc:                  Alek Poncedeleon S

## 2022-08-05 NOTE — TOC Progression Note (Signed)
Transition of Care [facility]) - Progression Note    Patient Details  Name: James Sanford MRN: [id number] Date of Birth: 15-Sep-1955  Transition of Care [facility]) CM/SW Contact  Lockie Pares, RN Phone Number: 08/05/2022, 4:41 PM  Clinical Narrative:    Spoke to patient over the phone. Introduced self and explained role. Discussion of home health follow up to previous CSW visit. Patient states he is tired and has been "talking a lot" it seems that " people are in and out all the time" he states he is not having a very good day. Discussed what home health is about, home visits and "homework" exercise to do. Is not sure what his decision is yet. Would like to speak more tomorrow.  He has a walker and wheelchair at home. He states that he doesn't remember signing up for [employer]. As far as he knows he has [employer] and [employer].  TOC will follow   Expected Discharge Plan: Home w Home Health Services Barriers to Discharge: Continued Medical Work up  Expected Discharge Plan and Services In-house Referral: Clinical Social Work   Post Acute Care Choice:  (TBD) Living arrangements for the past 2 months: Single Family Home                                       Social Determinants of Health (SDOH) Interventions SDOH Screenings   Tobacco Use: Medium Risk (08/05/2022)    Readmission Risk Interventions     No data to display

## 2022-08-05 NOTE — Progress Notes (Signed)
Physical Therapy Treatment Patient Details Name: James Sanford MRN: [id number] DOB: 06/20/1955 Today's Date: 08/05/2022   History of Present Illness Pt is a 67 y/o M admitted on 08/03/22 after presenting with c/o a fall after slipping on the stairs. X-ray showed periprosthetic distal L femur fx. Pt underwent ORIF of L distal femur fx & removal of hardware by Dr. Jena Gauss on 08/04/22. PMH: a-fib, anemia, arthritis, HLD, HTN, memory deficit, pre-diabetes, sleep apnea, obesity, ethanol use, GI bleed    PT Comments    Pt was seen for mobility to get to chair, to walk and to work on LE strengthening.  Pt is a bit impulsive, requiring a lot of direction for safety and use of walker.  His wife arrived after session and discussed his rehab plan, stating she had a trip and would not be home with him.  Has concerns about his fall frequency and therefore updating recommendation to encourage rehab placement before returning home.  Follow up for goals of acute PT before returning home.  Recommendations for follow up therapy are one component of a multi-disciplinary discharge planning process, led by the attending physician.  Recommendations may be updated based on patient status, additional functional criteria and insurance authorization.  Follow Up Recommendations  Can patient physically be transported by private vehicle: No    Assistance Recommended at Discharge Intermittent Supervision/Assistance  Patient can return home with the following A little help with walking and/or transfers;A little help with bathing/dressing/bathroom;Assistance with cooking/housework;Direct supervision/assist for medications management;Direct supervision/assist for financial management;Assist for transportation;Help with stairs or ramp for entrance   Equipment Recommendations  None recommended by PT    Recommendations for Other Services       Precautions / Restrictions Precautions Precautions:  Fall Restrictions Weight Bearing Restrictions: Yes LLE Weight Bearing: Weight bearing as tolerated     Mobility  Bed Mobility Overal bed mobility: Needs Assistance Bed Mobility: Supine to Sit     Supine to sit: Supervision          Transfers Overall transfer level: Needs assistance Equipment used: Rolling walker (2 wheels) Transfers: Sit to/from Stand Sit to Stand: Min assist           General transfer comment: pt required cues for sequence and correct hand placement    Ambulation/Gait Ambulation/Gait assistance: Min assist, Min guard Gait Distance (Feet): 40 Feet Assistive device: Rolling walker (2 wheels) Gait Pattern/deviations: Step-to pattern, Step-through pattern, Wide base of support Gait velocity: reduced Gait velocity interpretation: <1.31 ft/sec, indicative of household ambulator Pre-gait activities: standing balance correction General Gait Details: needs direction for walker and controlling his placement with himself centered   Stairs             Wheelchair Mobility    Modified Rankin (Stroke Patients Only)       Balance Overall balance assessment: Needs assistance Sitting-balance support: Feet supported Sitting balance-Leahy Scale: Good     Standing balance support: During functional activity, Bilateral upper extremity supported, Reliant on assistive device for balance Standing balance-Leahy Scale: Fair Standing balance comment: fair statically but less controlled dynamically                            Cognition Arousal/Alertness: Awake/alert Behavior During Therapy: Impulsive Overall Cognitive Status: History of cognitive impairments - at baseline  General Comments: requires repetitive cues for all mobility        Exercises General Exercises - Lower Extremity Ankle Circles/Pumps: AAROM, 5 reps Quad Sets: AAROM, 10 reps Gluteal Sets: AROM, 10 reps    General  Comments General comments (skin integrity, edema, etc.): Pt is up to walk with cues for direction and use of walker, seems unfocused but has baseline dementia      Pertinent Vitals/Pain Pain Assessment Pain Assessment: No/denies pain    Home Living Family/patient expects to be discharged to:: Private residence Living Arrangements: Spouse/significant other Available Help at Discharge: Family Type of Home: House Home Access: Ramped entrance     Alternate Level Stairs-Number of Steps: full flight but has stair lift so doesn't have to physically negotiate stairs Home Layout: Two level;1/2 bath on main level;Bed/bath upstairs Home Equipment: Rolling Walker (2 wheels);Wheelchair - manual Additional Comments: Pt reports living at home with wife who can assist as needed, has shower chair    Prior Function            PT Goals (current goals can now be found in the care plan section) Acute Rehab PT Goals Patient Stated Goal: walk a little more Progress towards PT goals: Progressing toward goals    Frequency    Min 4X/week      PT Plan Discharge plan needs to be updated    Co-evaluation              AM-PAC PT "6 Clicks" Mobility   Outcome Measure  Help needed turning from your back to your side while in a flat bed without using bedrails?: A Little Help needed moving from lying on your back to sitting on the side of a flat bed without using bedrails?: A Little Help needed moving to and from a bed to a chair (including a wheelchair)?: A Little Help needed standing up from a chair using your arms (e.g., wheelchair or bedside chair)?: A Little Help needed to walk in hospital room?: A Little Help needed climbing 3-5 steps with a railing? : Total 6 Click Score: 16    End of Session Equipment Utilized During Treatment: Gait belt Activity Tolerance: Patient tolerated treatment well Patient left: in chair;with chair alarm set;with call bell/phone within reach Nurse  Communication: Mobility status PT Visit Diagnosis: Difficulty in walking, not elsewhere classified (R26.2);Muscle weakness (generalized) (M62.81);Other abnormalities of gait and mobility (R26.89);Unsteadiness on feet (R26.81)     Time: 2956-2130 PT Time Calculation (min) (ACUTE ONLY): 31 min  Charges:  $Gait Training: 8-22 mins $Therapeutic Exercise: 8-22 mins                 Ivar Drape 08/05/2022, 3:56 PM  Samul Dada, PT PhD Acute Rehab Dept. Number: [facility] [id number] and [facility] [phone]

## 2022-08-05 NOTE — TOC CAGE-AID Note (Signed)
Transition of Care [facility]) - CAGE-AID Screening   Patient Details  Name: James Sanford MRN: [id number] Date of Birth: Oct 07, 1955  [facility]) CM/SW Contact:    Erin Sons, LCSW Phone Number: 08/05/2022, 9:49 AM   Clinical Narrative:  CSW met with pt and explained purpose of completing CAGE-AID screening. Pt pleasantly refused.  CAGE-AID Screening: Substance Abuse Screening unable to be completed due to: : Patient Refused

## 2022-08-05 NOTE — Progress Notes (Signed)
Inpatient Rehabilitation Admissions Coordinator   Asked by SW to assess for possible CIR admit. Current recommendations are not for CIR, nor is patient participating at a level for the intensity required of a CIR admit. Other rehab venues should be pursued.  Ottie Glazier, RN, MSN Rehab Admissions Coordinator [phone] 08/05/2022 3:37 PM

## 2022-08-05 NOTE — Progress Notes (Signed)
Orthopaedic Trauma Progress Note  SUBJECTIVE: Doing fairly well this morning.  Pain controlled.  Has not been up out of bed yet since surgery.  Notes difficulty lifting his leg which we discussed is normal given his injury and surgery.  No chest pain. No SOB. No nausea/vomiting. No other complaints.   OBJECTIVE:  Vitals:   08/05/22 0734 08/05/22 1220  BP: 103/75 110/86  Pulse: (!) 58 65  Resp: 17 16  Temp: 98.4 F (36.9 C) 97.8 F (36.6 C)  SpO2: 99%     General: Sitting up in bed comfortably, no acute distress Respiratory: No increased work of breathing.  Left lower extremity: Dressing clean, dry, intact.  Mildly tender over the knee and throughout the distal femur as expected.  Tolerates small amount of gentle passive and active knee range of motion.  Ankle dorsiflexion/plantarflexion intact.  + EHL/FHL.  Endorses sensation over all aspects of the foot.  Compartments soft compressible.  No calf tenderness. + DP pulse  IMAGING: Stable post op imaging.   LABS:  Results for orders placed or performed during the hospital encounter of 08/03/22 (from the past 24 hour(s))  VITAMIN D 25 Hydroxy (Vit-D Deficiency, Fractures)     Status: Abnormal   Collection Time: 08/05/22  5:10 AM  Result Value Ref Range   Vit D, 25-Hydroxy 7.88 (L) 30 - 100 ng/mL  CBC     Status: Abnormal   Collection Time: 08/05/22  5:10 AM  Result Value Ref Range   WBC 8.7 4.0 - 10.5 K/uL   RBC 3.04 (L) 4.22 - 5.81 MIL/uL   Hemoglobin 8.5 (L) 13.0 - 17.0 g/dL   HCT 16.1 (L) 09.6 - 04.5 %   MCV 89.1 80.0 - 100.0 fL   MCH 28.0 26.0 - 34.0 pg   MCHC 31.4 30.0 - 36.0 g/dL   RDW 40.9 (H) 81.1 - 91.4 %   Platelets 126 (L) 150 - 400 K/uL   nRBC 0.0 0.0 - 0.2 %  Basic metabolic panel     Status: Abnormal   Collection Time: 08/05/22  5:10 AM  Result Value Ref Range   Sodium 140 135 - 145 mmol/L   Potassium 4.4 3.5 - 5.1 mmol/L   Chloride 110 98 - 111 mmol/L   CO2 26 22 - 32 mmol/L   Glucose, Bld 165 (H) 70 - 99  mg/dL   BUN 23 8 - 23 mg/dL   Creatinine, Ser 7.82 0.61 - 1.24 mg/dL   Calcium 8.4 (L) 8.9 - 10.3 mg/dL   GFR, Estimated >95 >62 mL/min   Anion gap 4 (L) 5 - 15    ASSESSMENT: James Sanford is a 67 y.o. male, 1 Day Post-Op s/p OPEN REDUCTION INTERNAL FIXATION LEFT DISTAL FEMUR FRACTURE  CV/Blood loss: Acute blood loss anemia, Hgb 8.5 this morning. Hemodynamically stable  PLAN: Weightbearing: WBAT LLE ROM: Okay for unrestricted hip, knee, ankle range of motion Incisional and dressing care: Reinforce dressings as needed  Showering: Okay to begin showering/getting incisions wet 08/07/2022 Orthopedic device(s): None  Pain management:  1. Tylenol 325-650 mg q 6 hours PRN 2. Robaxin 500 mg q 6 hours PRN 3. Norco 5-325 mg or 7.5-325 mg q 6 hours PRN VTE prophylaxis:  Restart home dose Eliquis today , SCDs ID:  Ancef 2gm post op Foley/Lines:  No foley, KVO IVFs Impediments to Fracture Healing: Vitamin D level of 7.88.  Started on supplementation today Dispo: PT/OT evaluation today, dispo pending.  Patient will likely require SNF.  Plan to change dressing LLE tomorrow 08/06/2022.  Okay for discharge from ortho standpoint once cleared by medicine team and therapies  D/C recommendations: - Norco 5-325 mg q 6 hours PRN for pain control -Home dose Eliquis for DVT prophylaxis -Continue 50,000 units Vit D supplementation weekly x 8 weeks  Follow - up plan: 2 weeks after discharge for wound check and repeat x-rays   Contact information:  Truitt Merle MD, Thyra Breed PA-C. After hours and holidays please check [email or web address] for group call information for [facility]   Thompson Caul, PA-C [phone] (office) [email or web address]

## 2022-08-05 NOTE — Evaluation (Signed)
Occupational Therapy Evaluation Patient Details Name: James Sanford MRN: [id number] DOB: [birth year] Today's Date: 08/05/2022   History of Present Illness Pt is a 67 y/o M admitted on 08/03/22 after presenting with c/o a fall after slipping on the stairs. X-ray showed periprosthetic distal L femur fx. Pt underwent ORIF of L distal femur fx & removal of hardware by Dr. Jena Gauss on 08/04/22. PMH: a-fib, anemia, arthritis, HLD, HTN, memory deficit, pre-diabetes, sleep apnea, obesity, ethanol use, GI bleed   Clinical Impression   Pt s/p above diagnosis. Pt was agitated today, limiting session, refused to get up out of chair, able to perform ADLs sitting, min A for LB dressing. Pt lives at home with wife who will able to assist as needed for dressing. Per PT note, PT recommended HH and Pt is able to transfer min A. Pt has chair lift at home, so does not need to worry about stairs. Pt displays slight difficulty answering questions, has memory deficits at baseline. Pt has all DME at home needed to return safely with wife. Pt will be followed acutely to further instruct on compensatory strategies and improve activity tolerance, no OT follow up necessary.      Recommendations for follow up therapy are one component of a multi-disciplinary discharge planning process, led by the attending physician.  Recommendations may be updated based on patient status, additional functional criteria and insurance authorization.   Assistance Recommended at Discharge Intermittent Supervision/Assistance  Patient can return home with the following A little help with walking and/or transfers;A little help with bathing/dressing/bathroom;Assistance with cooking/housework;Assist for transportation;Help with stairs or ramp for entrance    Functional Status Assessment  Patient has had a recent decline in their functional status and demonstrates the ability to make significant improvements in function in a reasonable and  predictable amount of time.  Equipment Recommendations  None recommended by OT    Recommendations for Other Services       Precautions / Restrictions Precautions Precautions: Fall Restrictions Weight Bearing Restrictions: Yes LLE Weight Bearing: Weight bearing as tolerated      Mobility Bed Mobility               General bed mobility comments: Pt received in chair, refused to stand/transfer or perform bed mobility    Transfers                   General transfer comment: Pt refused transfers, will attempt at later time      Balance                                           ADL either performed or assessed with clinical judgement   ADL Overall ADL's : Needs assistance/impaired Eating/Feeding: Independent   Grooming: Set up   Upper Body Bathing: Set up   Lower Body Bathing: Minimal assistance   Upper Body Dressing : Set up   Lower Body Dressing: Minimal assistance       Toileting- Clothing Manipulation and Hygiene: Minimal assistance       Functional mobility during ADLs: Minimal assistance General ADL Comments: Pt requires min A for LB dressing, Pt refused to stand or perform transfers during session.     Vision Baseline Vision/History: 4 Cataracts Ability to See in Adequate Light: 0 Adequate Patient Visual Report: No change from baseline       Perception  Praxis      Pertinent Vitals/Pain Pain Assessment Pain Assessment: No/denies pain     Hand Dominance Right   Extremity/Trunk Assessment Upper Extremity Assessment Upper Extremity Assessment: Overall WFL for tasks assessed   Lower Extremity Assessment Lower Extremity Assessment: Defer to PT evaluation       Communication Communication Communication: No difficulties   Cognition Arousal/Alertness: Awake/alert Behavior During Therapy: Agitated Overall Cognitive Status: History of cognitive impairments - at baseline                                  General Comments: Pt was agitated and tired, stated he did not want to participate. Pt had difficulty with answering future tense questions,continued to answer questions about having trouble with ADLs in future with his past capabilities.     General Comments       Exercises     Shoulder Instructions      Home Living Family/patient expects to be discharged to:: Private residence Living Arrangements: Spouse/significant other Available Help at Discharge: Family Type of Home: House Home Access: Ramped entrance     Home Layout: Two level;1/2 bath on main level;Bed/bath upstairs Alternate Level Stairs-Number of Steps: full flight but has stair lift so doesn't have to physically negotiate stairs   Bathroom Shower/Tub: Chief Strategy Officer: Standard     Home Equipment: Agricultural consultant (2 wheels);Wheelchair - manual   Additional Comments: Pt reports living at home with wife who can assist as needed, has shower chair      Prior Functioning/Environment Prior Level of Function : Independent/Modified Independent             Mobility Comments: Pt reports he was independent without AD prior to this admission. ADLs Comments: Pt notes he uses briefs 2/2 bowel incontinence at baseline.        OT Problem List: Decreased strength;Decreased range of motion;Decreased activity tolerance;Impaired balance (sitting and/or standing);Decreased cognition;Pain      OT Treatment/Interventions: Self-care/ADL training;Therapeutic exercise;Energy conservation;DME and/or AE instruction;Therapeutic activities;Patient/family education    OT Goals(Current goals can be found in the care plan section) Acute Rehab OT Goals Patient Stated Goal: to return home OT Goal Formulation: With patient Time For Goal Achievement: 08/19/22 Potential to Achieve Goals: Good  OT Frequency: Min 2X/week    Co-evaluation              AM-PAC OT "6 Clicks" Daily Activity     Outcome  Measure Help from another person eating meals?: None Help from another person taking care of personal grooming?: A Little Help from another person toileting, which includes using toliet, bedpan, or urinal?: A Little Help from another person bathing (including washing, rinsing, drying)?: A Little Help from another person to put on and taking off regular upper body clothing?: A Little Help from another person to put on and taking off regular lower body clothing?: A Little 6 Click Score: 19   End of Session Nurse Communication: Mobility status  Activity Tolerance: Treatment limited secondary to agitation Patient left: in chair;with chair alarm set;with call bell/phone within reach  OT Visit Diagnosis: Unsteadiness on feet (R26.81);Other abnormalities of gait and mobility (R26.89);History of falling (Z91.81);Muscle weakness (generalized) (M62.81);Pain;Other symptoms and signs involving cognitive function Pain - Right/Left: Left Pain - part of body: Leg                Time: 1210-1225 OT Time Calculation (min): 15 min  Charges:  OT General Charges $OT Visit: 1 Visit OT Evaluation $OT Eval Moderate Complexity: 1 [address] OTR/L   Alexis Goodell 08/05/2022, 1:58 PM

## 2022-08-06 DIAGNOSIS — Z96649 Presence of unspecified artificial hip joint: Secondary | ICD-10-CM | POA: Diagnosis not present

## 2022-08-06 DIAGNOSIS — M978XXA Periprosthetic fracture around other internal prosthetic joint, initial encounter: Secondary | ICD-10-CM | POA: Diagnosis not present

## 2022-08-06 LAB — CBC
HCT: 25.9 % — ABNORMAL LOW (ref 39.0–52.0)
Hemoglobin: 8.2 g/dL — ABNORMAL LOW (ref 13.0–17.0)
MCH: 28.1 pg (ref 26.0–34.0)
MCHC: 31.7 g/dL (ref 30.0–36.0)
MCV: 88.7 fL (ref 80.0–100.0)
Platelets: 144 10*3/uL — ABNORMAL LOW (ref 150–400)
RBC: 2.92 MIL/uL — ABNORMAL LOW (ref 4.22–5.81)
RDW: 17.1 % — ABNORMAL HIGH (ref 11.5–15.5)
WBC: 6.7 10*3/uL (ref 4.0–10.5)
nRBC: 0 % (ref 0.0–0.2)

## 2022-08-06 MED ORDER — HYDROCODONE-ACETAMINOPHEN 5-325 MG PO TABS: 1.0000 | ORAL_TABLET | Freq: Four times a day (QID) | ORAL | 0 refills | Status: DC | PRN

## 2022-08-06 MED ORDER — VITAMIN D (ERGOCALCIFEROL) 1.25 MG (50000 UNIT) PO CAPS: 50000.0000 [IU] | ORAL_CAPSULE | ORAL | 0 refills | Status: DC

## 2022-08-06 NOTE — Discharge Instructions (Signed)
Orthopaedic Trauma Service Discharge Instructions   General Discharge Instructions  WEIGHT BEARING STATUS:weightbearing as tolerated  RANGE OF MOTION/ACTIVITY:Ok for unrestricted knee and hip range of motion  Wound Care: You may remove your surgical dressing. Incisions can be left open to air if there is no drainage. Once the incision is completely dry and without drainage, it may be left open to air out.  Showering may begin pos-op day #3 (Saturday 08/07/22).  Clean incision gently with soap and water.  DVT/PE prophylaxis:  home dose Eliquis  Diet: as you were eating previously.  Can use over the counter stool softeners and bowel preparations, such as Miralax, to help with bowel movements.  Narcotics can be constipating.  Be sure to drink plenty of fluids  PAIN MEDICATION USE AND EXPECTATIONS  You have likely been given narcotic medications to help control your pain.  After a traumatic event that results in an fracture (broken bone) with or without surgery, it is ok to use narcotic pain medications to help control one's pain.  We understand that everyone responds to pain differently and each individual patient will be evaluated on a regular basis for the continued need for narcotic medications. Ideally, narcotic medication use should last no more than 6-8 weeks (coinciding with fracture healing).   As a patient it is your responsibility as well to monitor narcotic medication use and report the amount and frequency you use these medications when you come to your office visit.   We would also advise that if you are using narcotic medications, you should take a dose prior to therapy to maximize you participation.  IF YOU ARE ON NARCOTIC MEDICATIONS IT IS NOT PERMISSIBLE TO OPERATE A MOTOR VEHICLE (MOTORCYCLE/CAR/TRUCK/MOPED) OR HEAVY MACHINERY DO NOT MIX NARCOTICS WITH OTHER CNS (CENTRAL NERVOUS SYSTEM) DEPRESSANTS SUCH AS ALCOHOL   STOP SMOKING OR USING NICOTINE PRODUCTS!!!!  As discussed  nicotine severely impairs your body's ability to heal surgical and traumatic wounds but also impairs bone healing.  Wounds and bone heal by forming microscopic blood vessels (angiogenesis) and nicotine is a vasoconstrictor (essentially, shrinks blood vessels).  Therefore, if vasoconstriction occurs to these microscopic blood vessels they essentially disappear and are unable to deliver necessary nutrients to the healing tissue.  This is one modifiable factor that you can do to dramatically increase your chances of healing your injury.    (This means no smoking, no nicotine gum, patches, etc)  DO NOT USE NONSTEROIDAL ANTI-INFLAMMATORY DRUGS (NSAID'S)  Using products such as Advil (ibuprofen), Aleve (naproxen), Motrin (ibuprofen) for additional pain control during fracture healing can delay and/or prevent the healing response.  If you would like to take over the counter (OTC) medication, Tylenol (acetaminophen) is ok.  However, some narcotic medications that are given for pain control contain acetaminophen as well. Therefore, you should not exceed more than 4000 mg of tylenol in a day if you do not have liver disease.  Also note that there are may OTC medicines, such as cold medicines and allergy medicines that my contain tylenol as well.  If you have any questions about medications and/or interactions please ask your doctor/PA or your pharmacist.      ICE AND ELEVATE INJURED/OPERATIVE EXTREMITY  Using ice and elevating the injured extremity above your heart can help with swelling and pain control.  Icing in a pulsatile fashion, such as 20 minutes on and 20 minutes off, can be followed.    Do not place ice directly on skin. Make sure there is  a barrier between to skin and the ice pack.    Using frozen items such as frozen peas works well as the conform nicely to the are that needs to be iced.  USE AN ACE WRAP OR TED HOSE FOR SWELLING CONTROL  In addition to icing and elevation, Ace wraps or TED hose are  used to help limit and resolve swelling.  It is recommended to use Ace wraps or TED hose until you are informed to stop.    When using Ace Wraps start the wrapping distally (farthest away from the body) and wrap proximally (closer to the body)   Example: If you had surgery on your leg or thing and you do not have a splint on, start the ace wrap at the toes and work your way up to the thigh        If you had surgery on your upper extremity and do not have a splint on, start the ace wrap at your fingers and work your way up to the upper arm     CALL THE OFFICE WITH ANY QUESTIONS OR CONCERNS: [phone]   VISIT OUR WEBSITE FOR ADDITIONAL INFORMATION: [email or web address]    Discharge Wound Care Instructions  Do NOT apply any ointments, solutions or lotions to pin sites or surgical wounds.  These prevent needed drainage and even though solutions like hydrogen peroxide kill bacteria, they also damage cells lining the pin sites that help fight infection.  Applying lotions or ointments can keep the wounds moist and can cause them to breakdown and open up as well. This can increase the risk for infection. When in doubt call the office.  Surgical incisions should be dressed daily.  If any drainage is noted, use one layer of adaptic or Mepitel, then gauze, Kerlix, and an ace wrap. - These dressing supplies should be available at local medical supply stores [facility], [facility], etc) as well as Insurance claims handler ([facility], [facility], [place], etc)  Once the incision is completely dry and without drainage, it may be left open to air out.  Showering may begin 36-48 hours later.  Cleaning gently with soap and water.

## 2022-08-06 NOTE — Plan of Care (Signed)

## 2022-08-06 NOTE — TOC Progression Note (Addendum)
Transition of Care [facility]) - Progression Note    Patient Details  Name: James Sanford MRN: [id number] Date of Birth: 06-Dec-1955  Transition of Care [facility]) CM/SW Contact  Lorri Frederick, LCSW Phone Number: 08/06/2022, 11:37 AM  Clinical Narrative:   CSW spoke with pt regarding DC plan of HH vs SNF.  Pt is agreeable for his referral to be sent out for SNF, permission given.  Pt saying he still needs to think about it and talk more with wife, who will be here later today.  From [place], referral sent to [facility] and [facility].  1215: Bed offers provided to pt on medicare choice document.  Expected Discharge Plan: Home w Home Health Services Barriers to Discharge: Continued Medical Work up  Expected Discharge Plan and Services In-house Referral: Clinical Social Work   Post Acute Care Choice:  (TBD) Living arrangements for the past 2 months: Single Family Home                                       Social Determinants of Health (SDOH) Interventions SDOH Screenings   Tobacco Use: Medium Risk (08/05/2022)    Readmission Risk Interventions     No data to display

## 2022-08-06 NOTE — Progress Notes (Signed)
PROGRESS NOTE  James Sanford  DOB: [birth year]  PCP: Jerl Mina, MD JYN:[id number]  DOA: 08/03/2022  LOS: 3 days  Hospital Day: 4  Brief narrative: James Sanford is a 67 y.o. male with PMH significant for prediabetes, HTN, HLD, OSA, A-fib on Eliquis., chronic alcoholism, h/o GI bleeding, chronic anemia, pancreatic insufficiency. 4/30, patient presented to the ED at [facility] after a fall and a leg injury. Per history, patient slipped and fell while walking up the stairs left leg followed by immediate significant pain. He denies hitting his head nor loss of consciousness.   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  Seen by orthopedics.  Given complex peri prosthetic fracture, patient was transferred to [facility] for surgical management. Seen by orthopedics Dr. Jena Gauss 5/1, underwent ORIF of left distal femur fracture  Subjective: Patient was seen and examined this morning.  Sitting up in bed.  Not in distress.  No new symptoms.  He wants to go home but wife wants him to be in rehab.  He ultimately decided to go to rehab.  Assessment and plan: Periprosthetic left femur fracture S/p ORIF - 5/1 Dr. Jena Gauss Secondary to mechanical fall  Imaging as above.  Procedure as above. Continue pain management.  Currently on scheduled Tylenol, Norco PRN and Dilaudid PRN Eliquis resumed  Seen by PT.  Pending SNF  Chronic anemia H/o GI bleeding It seems her baseline hemoglobin hovers around 10 Postop, hemoglobin is downtrending, 8.2 today. Likely secondary to blood loss from long bone fracture and surgery.  No bleeding ulcer.  Continue to monitor. Recent Labs    03/05/22 2231 08/03/22 0130 08/04/22 0738 08/05/22 0510 08/06/22 0503  HGB 9.9* 10.6* 9.3* 8.5*  8.2*  MCV 88.4 90.6 88.8 89.1 88.7    A-fib Not on any AV nodal blocking agent due to history of bradycardia Chronically on Eliquis.  Resumed.  H/o HTN, HLD, prediabetes Not currently on any medication for these issues.  Continue to monitor   Chronic alcoholism Fatty liver No mention of cirrhosis in previous imaging from September 2023. Unclear why patient was on Xifaxan at home before.  Pancreatic insufficiency Seen recently for this with associated loose stool. Continue home Creon   Eye drops > On multiple eyedrops for problems not listed in our system. -Will work with pharmacy to hopefully confirm medications, was able to confirm prednisolone with the patient, his wife will be bringing them in when she visits him.   OSA  Impaired mobility PT eval obtained.  Formal PT recommended.   Goals of care   Code Status: Full Code     DVT prophylaxis:  Place and maintain sequential compression device Start: 08/05/22 0810 SCDs Start: 08/04/22 1308 apixaban (ELIQUIS) tablet 5 mg   Antimicrobials: None Fluid: Not on IV Consultants: Orthopedics Family Communication: None at bedside  Status: Inpatient Level of care:  Telemetry Surgical   Patient from: Home Anticipated d/c to: medically stable for discharge, pending SNF    Diet:  Diet Order             Diet Heart Room service appropriate? Yes; Fluid consistency: Thin  Diet effective now                   Scheduled Meds:  apixaban  5 mg Oral BID  docusate sodium  100 mg Oral BID   lipase/protease/amylase  24,000 Units Oral TID AC   prednisoLONE acetate  1 drop Right Eye QID   Vitamin D (Ergocalciferol)  50,000 Units Oral Q7 days    PRN meds: acetaminophen, HYDROcodone-acetaminophen, HYDROcodone-acetaminophen, HYDROmorphone (DILAUDID) injection, methocarbamol **OR** methocarbamol (ROBAXIN) IV, metoCLOPramide **OR** metoCLOPramide (REGLAN) injection, ondansetron **OR** ondansetron (ZOFRAN) IV, polyethylene  glycol   Infusions:   methocarbamol (ROBAXIN) IV      Antimicrobials: Anti-infectives (From admission, onward)    Start     Dose/Rate Route Frequency Ordered Stop   08/04/22 1400  ceFAZolin (ANCEF) IVPB 2g/100 mL premix        2 g 200 mL/hr over 30 Minutes Intravenous Every 8 hours 08/04/22 1307 08/05/22 0909   08/04/22 1136  vancomycin (VANCOCIN) powder  Status:  Discontinued          As needed 08/04/22 1137 08/04/22 1206   08/04/22 0945  ceFAZolin (ANCEF) IVPB 2g/100 mL premix        2 g 200 mL/hr over 30 Minutes Intravenous On call to O.R. 08/04/22 0937 08/04/22 1347       Nutritional status:  Body mass index is 21.7 kg/m.          Objective: Vitals:   08/06/22 0357 08/06/22 1315  BP: 118/72 116/77  Pulse: 70 65  Resp: 18 16  Temp:  98.6 F (37 C)  SpO2: 98% 100%    Intake/Output Summary (Last 24 hours) at 08/06/2022 1320 Last data filed at 08/06/2022 1317 Gross per 24 hour  Intake --  Output 1550 ml  Net -1550 ml    Filed Weights   08/04/22 0928  Weight: 72.6 kg   Weight change:  Body mass index is 21.7 kg/m.   Physical Exam: General exam: Pleasant, not in distress.  No new symptoms Skin: No rashes, lesions or ulcers. HEENT: Atraumatic, normocephalic, no obvious bleeding Lungs: Clear to auscultation bilaterally CVS: Regular rate and rhythm, no murmur GI/Abd soft, nontender, nondistended, bowel sound present CNS: Alert, awake, oriented x 3 Psychiatry: Mood appropriate Extremities: No pedal edema, no calf tenderness  Data Review: I have personally reviewed the laboratory data and studies available.  F/u labs ordered Unresulted Labs (From admission, onward)    None       Total time spent in review of labs and imaging, patient evaluation, formulation of plan, documentation and communication with family: 45 minutes  Signed, Lorin Glass, MD [facility] 08/06/2022

## 2022-08-06 NOTE — Progress Notes (Signed)
Orthopaedic Trauma Progress Note  SUBJECTIVE: Doing fairly well this morning.  Pain controlled.  States therapies went well yesterday. He is trying to decide between SNF and home with HH at d/c. Plans to discuss this further with his wife today. No chest pain. No SOB. No nausea/vomiting. No other complaints.   OBJECTIVE:  Vitals:   08/05/22 2350 08/06/22 0357  BP: 113/72 118/72  Pulse: 65 70  Resp: 18 18  Temp:    SpO2: 99% 98%    General: Sitting up in bed , eating breakfast. No acute distress Respiratory: No increased work of breathing.  Left lower extremity: Dressings removed, incisions clean, dry, intact.  Mildly tender over the knee and throughout the distal femur as expected.  Tolerates small amount of gentle passive and active knee range of motion.  Ankle dorsiflexion/plantarflexion intact.  + EHL/FHL.  Endorses sensation over all aspects of the foot.  Compartments soft compressible.  No calf tenderness. + DP pulse  IMAGING: Stable post op imaging.   LABS:  Results for orders placed or performed during the hospital encounter of 08/03/22 (from the past 24 hour(s))  CBC     Status: Abnormal   Collection Time: 08/06/22  5:03 AM  Result Value Ref Range   WBC 6.7 4.0 - 10.5 K/uL   RBC 2.92 (L) 4.22 - 5.81 MIL/uL   Hemoglobin 8.2 (L) 13.0 - 17.0 g/dL   HCT 16.1 (L) 09.6 - 04.5 %   MCV 88.7 80.0 - 100.0 fL   MCH 28.1 26.0 - 34.0 pg   MCHC 31.7 30.0 - 36.0 g/dL   RDW 40.9 (H) 81.1 - 91.4 %   Platelets 144 (L) 150 - 400 K/uL   nRBC 0.0 0.0 - 0.2 %    ASSESSMENT: James Sanford is a 67 y.o. male, 2 Days Post-Op s/p OPEN REDUCTION INTERNAL FIXATION LEFT DISTAL FEMUR FRACTURE  CV/Blood loss: Acute blood loss anemia, Hgb 8.2 this morning. Hemodynamically stable  PLAN: Weightbearing: WBAT LLE ROM: Okay for unrestricted hip, knee, ankle range of motion Incisional and dressing care: Ok to leave open to air Showering: Okay to begin showering/getting incisions wet  08/07/2022 Orthopedic device(s): None  Pain management:  1. Tylenol 325-650 mg q 6 hours PRN 2. Robaxin 500 mg q 6 hours PRN 3. Norco 5-325 mg or 7.5-325 mg q 6 hours PRN VTE prophylaxis:  home dose Eliquis , SCDs ID:  Ancef 2gm post op completed Foley/Lines:  No foley, KVO IVFs Impediments to Fracture Healing: Vitamin D level of 7.88.  Started on supplementation  Dispo: PT/OT evaluation ongoing, SNF vs HH. Will await final dispo decisions before printing or sending d/c rx for pain medication and Vit D. Okay for discharge from ortho standpoint once cleared by medicine team and therapies  D/C recommendations: - Norco 5-325 mg q 6 hours PRN for pain control -Home dose Eliquis for DVT prophylaxis -Continue 50,000 units Vit D supplementation weekly x 8 weeks  Follow - up plan: 2 weeks after discharge for wound check and repeat x-rays   Contact information:  Truitt Merle MD, Thyra Breed PA-C. After hours and holidays please check [email or web address] for group call information for [facility]   Thompson Caul, PA-C [phone] (office) [email or web address]

## 2022-08-06 NOTE — NC FL2 (Signed)
[place] MEDICAID FL2 LEVEL OF CARE FORM     IDENTIFICATION  Patient Name: James Sanford Birthdate: [birth year] Sex: male Admission Date (Current Location): 08/03/2022  [facility] and [place] Number:  Producer, television/film/video and Address:  [facility], [address] [place], [place] [address]      Provider Number: [id number]  Attending Physician Name and Address:  Lorin Glass, MD  Relative Name and Phone Number:  FRANKLYN, BARSH [phone]    Current Level of Care: Hospital Recommended Level of Care: Skilled Nursing Facility Prior Approval Number:    Date Approved/Denied:   PASRR Number: [id number]  Discharge Plan: SNF    Current Diagnoses: Patient Active Problem List   Diagnosis Date Noted   Femoral fracture (HCC) 08/03/2022   Periprosthetic fracture of shaft of femur 08/03/2022   Alcohol abuse with alcohol-induced mood disorder (HCC) 03/06/2022   Dehydration    AKI (acute kidney injury) (HCC) 03/04/2021   Herpes zoster ophthalmicus of right eye 03/03/2021   Alcohol use disorder, moderate, dependence (HCC) 03/03/2021   Lactic acidosis 03/03/2021   Iron deficiency anemia    Hypokalemia    Diarrhea    Impaired fasting glucose    Malnutrition of moderate degree 12/02/2020   Protein-calorie malnutrition, severe 11/25/2020   Closed nondisplaced intertrochanteric fracture of left femur (HCC) 11/24/2020   Acute lower UTI 11/24/2020   Hyperlipemia    Acute GI bleeding 01/25/2020   Diabetes mellitus type 2, uncomplicated (HCC) 01/25/2020   Hyperlipidemia 01/25/2020   Obesity 01/25/2020   Essential hypertension 01/25/2020   Sleep apnea 01/25/2020   Alcohol abuse 03/07/2019   Cardiomyopathy, idiopathic (HCC) 03/07/2019   AF (paroxysmal atrial fibrillation) (HCC) 11/07/2017   Radius fracture 10/29/2017    Orientation RESPIRATION BLADDER Height & Weight     Self, Time, Situation, Place  Normal Continent Weight: 160 lb (72.6  kg) Height:  6' (182.9 cm)  BEHAVIORAL SYMPTOMS/MOOD NEUROLOGICAL BOWEL NUTRITION STATUS      Continent Diet (see discharge summary)  AMBULATORY STATUS COMMUNICATION OF NEEDS Skin   Limited Assist Verbally Surgical wounds                       Personal Care Assistance Level of Assistance  Bathing, Feeding, Dressing Bathing Assistance: Limited assistance Feeding assistance: Independent Dressing Assistance: Limited assistance     Functional Limitations Info  Sight, Hearing, Speech Sight Info: Adequate Hearing Info: Adequate Speech Info: Adequate    SPECIAL CARE FACTORS FREQUENCY  PT (By licensed PT), OT (By licensed OT)     PT Frequency: 5x week OT Frequency: 5x week            Contractures Contractures Info: Not present    Additional Factors Info  Code Status, Allergies Code Status Info: full Allergies Info: penicillins           Current Medications (08/06/2022):  This is the current hospital active medication list Current Facility-Administered Medications  Medication Dose Route Frequency Provider Last Rate Last Admin   acetaminophen (TYLENOL) tablet 325-650 mg  325-650 mg Oral Q6H PRN [place] Bali, PA-C   650 mg at 08/05/22 0908   apixaban (ELIQUIS) tablet 5 mg  5 mg Oral BID Reome, Earle J, RPH   5 mg at 08/06/22 0816   docusate sodium (COLACE) capsule 100 mg  100 mg Oral BID [place] B[place], PA-C   100 mg at 08/06/22 0816   HYDROcodone-acetaminophen (NORCO) 7.5-325 MG per tablet 1-2  tablet  1-2 tablet Oral Q6H PRN [place] B[place], PA-C   2 tablet at 08/06/22 0454   HYDROcodone-acetaminophen (NORCO/VICODIN) 5-325 MG per tablet 1-2 tablet  1-2 tablet Oral Q6H PRN [place] B[place], PA-C   2 tablet at 08/05/22 0981   HYDROmorphone (DILAUDID) injection 0.5 mg  0.5 mg Intravenous Q2H PRN [place] B[place], PA-C       lipase/protease/amylase (CREON) capsule 24,000 Units  24,000 Units Oral TID AC [place] Bali, PA-C   24,000 Units at 08/06/22 0815    methocarbamol (ROBAXIN) tablet 500 mg  500 mg Oral Q6H PRN [place] Bali, PA-C       Or   methocarbamol (ROBAXIN) 500 mg in dextrose 5 % 50 mL IVPB  500 mg Intravenous Q6H PRN Sharon Seller, Sarah A, PA-C       metoCLOPramide (REGLAN) tablet 5-10 mg  5-10 mg Oral Q8H PRN Sharon Seller, Sarah A, PA-C       Or   metoCLOPramide (REGLAN) injection 5-10 mg  5-10 mg Intravenous Q8H PRN [place] Bali, PA-C       ondansetron (ZOFRAN) tablet 4 mg  4 mg Oral Q6H PRN [place] Bali, PA-C       Or   ondansetron (ZOFRAN) injection 4 mg  4 mg Intravenous Q6H PRN McClung, Sarah A, PA-C       polyethylene glycol (MIRALAX / GLYCOLAX) packet 17 g  17 g Oral Daily PRN Sharon Seller, Sarah A, PA-C       prednisoLONE acetate (PRED FORTE) 1 % ophthalmic suspension 1 drop  1 drop Right Eye QID [place] Bali, PA-C   1 drop at 08/06/22 1914   Vitamin D (Ergocalciferol) (DRISDOL) 1.25 MG (50000 UNIT) capsule 50,000 Units  50,000 Units Oral Q7 days [place] Bali, PA-C   50,000 Units at 08/05/22 1726     Discharge Medications: Please see discharge summary for a list of discharge medications.  Relevant Imaging Results:  Relevant Lab Results:   Additional Information SS # [id number]  Lorri Frederick, LCSW

## 2022-08-06 NOTE — Progress Notes (Signed)
PT Cancellation Note  Patient Details Name: James Sanford MRN: [id number] DOB: 1955/08/08   Cancelled Treatment:    Reason Eval/Treat Not Completed: (P) Patient declined, no reason specified (Pt agitated upon entry and stating "I have a lot going on that I need to take care of". Pt declining mobility and asking therapist to come back tomorrow. Will continue to follow per PT POC.)   Johny Shock 08/06/2022, 3:33 PM

## 2022-08-07 DIAGNOSIS — M978XXA Periprosthetic fracture around other internal prosthetic joint, initial encounter: Secondary | ICD-10-CM | POA: Diagnosis not present

## 2022-08-07 DIAGNOSIS — Z96649 Presence of unspecified artificial hip joint: Secondary | ICD-10-CM | POA: Diagnosis not present

## 2022-08-07 NOTE — Progress Notes (Signed)
Physical Therapy Treatment Patient Details Name: James Sanford MRN: [id number] DOB: December 09, 1955 Today's Date: 08/07/2022   History of Present Illness Pt is a 67 y/o M admitted on 08/03/22 after presenting with c/o a fall after slipping on the stairs. X-ray showed periprosthetic distal L femur fx. Pt underwent ORIF of L distal femur fx & removal of hardware by Dr. Jena Gauss on 08/04/22. PMH: a-fib, anemia, arthritis, HLD, HTN, memory deficit, pre-diabetes, sleep apnea, obesity, ethanol use, GI bleed    PT Comments    Pt received in supine and agreeable to session. Pt able to tolerate increased gait distance this session with min guard for safety. Pt demonstrating slightly antalgic pattern and unable to follow cues for sequencing, but no unsteadiness or LOB noted. Pt also able to perform marching with focus on increased LLE hip flexion. Pt continues to benefit from PT services to progress toward functional mobility goals.     Recommendations for follow up therapy are one component of a multi-disciplinary discharge planning process, led by the attending physician.  Recommendations may be updated based on patient status, additional functional criteria and insurance authorization.     Assistance Recommended at Discharge Intermittent Supervision/Assistance  Patient can return home with the following A little help with walking and/or transfers;A little help with bathing/dressing/bathroom;Assistance with cooking/housework;Direct supervision/assist for medications management;Direct supervision/assist for financial management;Assist for transportation;Help with stairs or ramp for entrance   Equipment Recommendations  None recommended by PT    Recommendations for Other Services       Precautions / Restrictions Precautions Precautions: Fall Restrictions Weight Bearing Restrictions: Yes LLE Weight Bearing: Weight bearing as tolerated     Mobility  Bed Mobility               General bed  mobility comments: Pt beginning and ending session in recliner    Transfers Overall transfer level: Needs assistance Equipment used: Rolling walker (2 wheels) Transfers: Sit to/from Stand Sit to Stand: Min guard           General transfer comment: From recliner x2 with min guard for safety. Pt demonstrating good hand and LLE placement.    Ambulation/Gait Ambulation/Gait assistance: Min guard Gait Distance (Feet): 85 Feet (+30) Assistive device: Rolling walker (2 wheels) Gait Pattern/deviations: Step-through pattern, Antalgic, Trunk flexed, Decreased stance time - left       General Gait Details: Cues for upright posture and sequencing due to stepping past the front bar of RW. Pt demonstrating good step lengths and heavy reliance on RW support to offload LLE.      Balance Overall balance assessment: Needs assistance Sitting-balance support: Feet supported Sitting balance-Leahy Scale: Good Sitting balance - Comments: sitting in recliner   Standing balance support: During functional activity, Bilateral upper extremity supported, Reliant on assistive device for balance Standing balance-Leahy Scale: Fair Standing balance comment: with RW support                            Cognition Arousal/Alertness: Awake/alert Behavior During Therapy: WFL for tasks assessed/performed Overall Cognitive Status: History of cognitive impairments - at baseline                                 General Comments: Pt with good conversation throughout. Pt requiring dense cues for sequencing and not stepping past the front bar of RW, but pt not able to carryover.  Exercises General Exercises - Lower Extremity Hip Flexion/Marching: AROM, Standing, Both, 10 reps    General Comments        Pertinent Vitals/Pain Pain Assessment Pain Assessment: Faces Faces Pain Scale: Hurts a little bit Pain Location: LLE Pain Descriptors / Indicators: Grimacing,  Guarding Pain Intervention(s): Monitored during session, Repositioned     PT Goals (current goals can now be found in the care plan section) Acute Rehab PT Goals Patient Stated Goal: walk a little more PT Goal Formulation: With patient Time For Goal Achievement: 08/18/22 Potential to Achieve Goals: Good Progress towards PT goals: Progressing toward goals    Frequency    Min 4X/week      PT Plan Current plan remains appropriate       AM-PAC PT "6 Clicks" Mobility   Outcome Measure  Help needed turning from your back to your side while in a flat bed without using bedrails?: A Little Help needed moving from lying on your back to sitting on the side of a flat bed without using bedrails?: A Little Help needed moving to and from a bed to a chair (including a wheelchair)?: A Little Help needed standing up from a chair using your arms (e.g., wheelchair or bedside chair)?: A Little Help needed to walk in hospital room?: A Little Help needed climbing 3-5 steps with a railing? : Total 6 Click Score: 16    End of Session Equipment Utilized During Treatment: Gait belt Activity Tolerance: Patient tolerated treatment well Patient left: in chair;with chair alarm set;with call bell/phone within reach Nurse Communication: Mobility status PT Visit Diagnosis: Difficulty in walking, not elsewhere classified (R26.2);Muscle weakness (generalized) (M62.81);Other abnormalities of gait and mobility (R26.89);Unsteadiness on feet (R26.81)     Time: 4098-1191 PT Time Calculation (min) (ACUTE ONLY): 19 min  Charges:  $Gait Training: 8-22 mins                     Johny Shock, PTA Acute Rehabilitation Services Secure Chat Preferred  [phone]    Johny Shock 08/07/2022, 3:21 PM

## 2022-08-07 NOTE — Plan of Care (Signed)

## 2022-08-07 NOTE — Progress Notes (Signed)
PROGRESS NOTE  James Sanford  DOB: 09-22-1955  PCP: Jerl Mina, MD ZOX:[id number]  DOA: 08/03/2022  LOS: 4 days  Hospital Day: 5  Brief narrative: James Sanford is a 67 y.o. male with PMH significant for prediabetes, HTN, HLD, OSA, A-fib on Eliquis., chronic alcoholism, h/o GI bleeding, chronic anemia, pancreatic insufficiency. 4/30, patient presented to the ED at [facility] after a fall and a leg injury. Per history, patient slipped and fell while walking up the stairs left leg followed by immediate significant pain. He denies hitting his head nor loss of consciousness.   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  Seen by orthopedics.  Given complex peri prosthetic fracture, patient was transferred to [facility] for surgical management. Seen by orthopedics Dr. Jena Gauss 5/1, underwent ORIF of left distal femur fracture  Subjective: Patient was seen and examined this morning.  Sitting up in recliner.  Not in distress.  No new symptoms.  Pending SNF  Assessment and plan: Periprosthetic left femur fracture S/p ORIF - 5/1 Dr. Jena Gauss Secondary to mechanical fall  Imaging as above.  Procedure as above. Continue pain management.  Currently on scheduled Tylenol, Norco PRN and Dilaudid PRN On long-term anticoagulation with Eliquis Seen by PT.  Pending SNF  Chronic anemia H/o GI bleeding It seems her baseline hemoglobin hovers around 10 Postop, hemoglobin is downtrending, 8.2 on last check yesterday.. Likely secondary to blood loss from long bone fracture and surgery.  No bleeding elsewhere.  Continue to monitor Recent Labs    03/05/22 2231 08/03/22 0130 08/04/22 0738 08/05/22 0510 08/06/22 0503  HGB 9.9* 10.6* 9.3* 8.5* 8.2*  MCV 88.4 90.6 88.8 89.1  88.7    A-fib Not on any AV nodal blocking agent due to history of bradycardia Chronically on Eliquis.  Resumed.  H/o HTN, HLD, prediabetes Not currently on any medication for these issues.  Continue to monitor   Chronic alcoholism Fatty liver No mention of cirrhosis in previous imaging from September 2023. Unclear why patient was on Xifaxan at home before.  Pancreatic insufficiency Seen recently for this with associated loose stool. Continue home Creon   Eye drops >On multiple eyedrops for problems not listed in our system. -Will work with pharmacy to hopefully confirm medications, was able to confirm prednisolone with the patient, his wife will be bringing them in when she visits him.   OSA  Impaired mobility PT eval obtained.  SNF was recommended.   Goals of care   Code Status: Full Code     DVT prophylaxis:  Place and maintain sequential compression device Start: 08/05/22 0810 SCDs Start: 08/04/22 1308 apixaban (ELIQUIS) tablet 5 mg   Antimicrobials: None Fluid: Not on IV fluid Consultants: Orthopedics Family Communication: None at bedside  Status: Inpatient Level of care:  Telemetry Surgical   Patient from: Home Anticipated d/c to: medically stable for discharge, pending SNF    Diet:  Diet Order             Diet Heart Room service appropriate? Yes; Fluid consistency: Thin  Diet effective now                   Scheduled Meds:  apixaban  5 mg Oral BID   docusate sodium  100 mg Oral BID   lipase/protease/amylase  24,000 Units Oral  TID AC   prednisoLONE acetate  1 drop Right Eye QID   Vitamin D (Ergocalciferol)  50,000 Units Oral Q7 days    PRN meds: acetaminophen, HYDROcodone-acetaminophen, HYDROcodone-acetaminophen, HYDROmorphone (DILAUDID) injection, methocarbamol **OR** methocarbamol (ROBAXIN) IV, metoCLOPramide **OR** metoCLOPramide (REGLAN) injection, ondansetron **OR** ondansetron (ZOFRAN) IV, polyethylene glycol   Infusions:    methocarbamol (ROBAXIN) IV      Antimicrobials: Anti-infectives (From admission, onward)    Start     Dose/Rate Route Frequency Ordered Stop   08/04/22 1400  ceFAZolin (ANCEF) IVPB 2g/100 mL premix        2 g 200 mL/hr over 30 Minutes Intravenous Every 8 hours 08/04/22 1307 08/05/22 0909   08/04/22 1136  vancomycin (VANCOCIN) powder  Status:  Discontinued          As needed 08/04/22 1137 08/04/22 1206   08/04/22 0945  ceFAZolin (ANCEF) IVPB 2g/100 mL premix        2 g 200 mL/hr over 30 Minutes Intravenous On call to O.R. 08/04/22 0937 08/04/22 1347       Nutritional status:  Body mass index is 21.7 kg/m.          Objective: Vitals:   08/07/22 0503 08/07/22 0848  BP: 106/80 107/75  Pulse: 63 75  Resp: 16 17  Temp: 97.7 F (36.5 C) 98.2 F (36.8 C)  SpO2: 100% 98%    Intake/Output Summary (Last 24 hours) at 08/07/2022 1329 Last data filed at 08/07/2022 0830 Gross per 24 hour  Intake 240 ml  Output 200 ml  Net 40 ml    Filed Weights   08/04/22 0928  Weight: 72.6 kg   Weight change:  Body mass index is 21.7 kg/m.   Physical Exam: General exam: Pleasant, not in distress.  No new symptoms Skin: No rashes, lesions or ulcers. HEENT: Atraumatic, normocephalic, no obvious bleeding Lungs: Clear to auscultation bilaterally CVS: Regular rate and rhythm, no murmur GI/Abd soft, nontender, nondistended, bowel sound present CNS: Alert, awake, oriented x 3 Psychiatry: Mood appropriate Extremities: No pedal edema, no calf tenderness  Data Review: I have personally reviewed the laboratory data and studies available.  F/u labs ordered Unresulted Labs (From admission, onward)    None       Total time spent in review of labs and imaging, patient evaluation, formulation of plan, documentation and communication with family: 25 minutes  Signed, Lorin Glass, MD [facility] 08/07/2022

## 2022-08-07 NOTE — TOC Progression Note (Signed)
Transition of Care [facility]) - Progression Note    Patient Details  Name: James Sanford MRN: [id number] Date of Birth: 1955-05-21  Transition of Care [facility]) CM/SW Contact  Donnalee Curry, LCSWA Phone Number: 08/07/2022, 11:44 AM  Clinical Narrative:     SW met with pt at bedside, pt reports wife still reviewing SNF options.   Expected Discharge Plan: Home w Home Health Services Barriers to Discharge: Continued Medical Work up  Expected Discharge Plan and Services In-house Referral: Clinical Social Work   Post Acute Care Choice:  (TBD) Living arrangements for the past 2 months: Single Family Home                                       Social Determinants of Health (SDOH) Interventions SDOH Screenings   Tobacco Use: Medium Risk (08/05/2022)    Readmission Risk Interventions     No data to display

## 2022-08-07 NOTE — Progress Notes (Signed)
Occupational Therapy Treatment Patient Details Name: James Sanford MRN: [id number] DOB: 10/17/1955 Today's Date: 08/07/2022   History of present illness Pt is a 67 y/o M admitted on 08/03/22 after presenting with c/o a fall after slipping on the stairs. X-ray showed periprosthetic distal L femur fx. Pt underwent ORIF of L distal femur fx & removal of hardware by Dr. Jena Gauss on 08/04/22. PMH: a-fib, anemia, arthritis, HLD, HTN, memory deficit, pre-diabetes, sleep apnea, obesity, ethanol use, GI bleed   OT comments  Patient seated in recliner upon entry and was able to stand with min assist to donn brief. Patient expressed interest in adaptive equipment for LB dressing and was instructed on reacher and sock aide use with min assist. Patient instructed on other AE and uses. Patient provided with red therapy band and UE exercises to assist with sit to stands and walker use. Patient making good gains and will continue to be followed by acute OT to address LB dressing, toilet transfers, and safetly.    Recommendations for follow up therapy are one component of a multi-disciplinary discharge planning process, led by the attending physician.  Recommendations may be updated based on patient status, additional functional criteria and insurance authorization.    Assistance Recommended at Discharge Intermittent Supervision/Assistance  Patient can return home with the following  A little help with walking and/or transfers;A little help with bathing/dressing/bathroom;Assistance with cooking/housework;Assist for transportation;Help with stairs or ramp for entrance   Equipment Recommendations  None recommended by OT    Recommendations for Other Services      Precautions / Restrictions Precautions Precautions: Fall Restrictions Weight Bearing Restrictions: Yes LLE Weight Bearing: Weight bearing as tolerated       Mobility Bed Mobility Overal bed mobility: Needs Assistance              General bed mobility comments: OOB in recliner    Transfers Overall transfer level: Needs assistance Equipment used: Rolling walker (2 wheels) Transfers: Sit to/from Stand Sit to Stand: Min assist           General transfer comment: stood from recliner to donn brief     Balance Overall balance assessment: Needs assistance Sitting-balance support: Feet supported Sitting balance-Leahy Scale: Good     Standing balance support: During functional activity, Bilateral upper extremity supported, Reliant on assistive device for balance Standing balance-Leahy Scale: Fair Standing balance comment: reliant on RW while standing                           ADL either performed or assessed with clinical judgement   ADL Overall ADL's : Needs assistance/impaired                     Lower Body Dressing: Minimal assistance;With adaptive equipment;Sit to/from stand Lower Body Dressing Details (indicate cue type and reason): Patient stood for donning brief and AE training performed seated with reacher and sock aide               General ADL Comments: Patient declined ambulating to sink in bathroom stating he wanted to perform walking with PT    Extremity/Trunk Assessment              Vision       Perception     Praxis      Cognition Arousal/Alertness: Awake/alert Behavior During Therapy: Flat affect Overall Cognitive Status: History of cognitive impairments - at baseline  Exercises Exercises: General Upper Extremity General Exercises - Upper Extremity Shoulder Flexion: Strengthening, Both, 10 reps, Seated, Theraband Theraband Level (Shoulder Flexion): Level 2 (Red) Shoulder Horizontal ABduction: Strengthening, Both, 10 reps, Seated, Theraband Theraband Level (Shoulder Horizontal Abduction): Level 2 (Red)    Shoulder Instructions       General Comments      Pertinent Vitals/ Pain        Pain Assessment Pain Assessment: Faces Faces Pain Scale: Hurts a little bit Pain Location: LLE Pain Descriptors / Indicators: Grimacing, Guarding Pain Intervention(s): Monitored during session  Home Living                                          Prior Functioning/Environment              Frequency  Min 2X/week        Progress Toward Goals  OT Goals(current goals can now be found in the care plan section)  Progress towards OT goals: Progressing toward goals  Acute Rehab OT Goals Patient Stated Goal: get better OT Goal Formulation: With patient Time For Goal Achievement: 08/19/22 Potential to Achieve Goals: Good ADL Goals Pt Will Perform Lower Body Bathing: with adaptive equipment;with modified independence Pt Will Perform Lower Body Dressing: with adaptive equipment;sit to/from stand;with supervision Pt Will Perform Toileting - Clothing Manipulation and hygiene: with adaptive equipment;sit to/from stand;with supervision Pt Will Perform Tub/Shower Transfer: with supervision;shower seat  Plan Discharge plan remains appropriate    Co-evaluation                 AM-PAC OT "6 Clicks" Daily Activity     Outcome Measure   Help from another person eating meals?: None Help from another person taking care of personal grooming?: A Little Help from another person toileting, which includes using toliet, bedpan, or urinal?: A Little Help from another person bathing (including washing, rinsing, drying)?: A Little Help from another person to put on and taking off regular upper body clothing?: A Little Help from another person to put on and taking off regular lower body clothing?: A Little 6 Click Score: 19    End of Session Equipment Utilized During Treatment: Rolling walker (2 wheels)  OT Visit Diagnosis: Unsteadiness on feet (R26.81);Other abnormalities of gait and mobility (R26.89);History of falling (Z91.81);Muscle weakness (generalized)  (M62.81);Pain;Other symptoms and signs involving cognitive function Pain - Right/Left: Left Pain - part of body: Leg   Activity Tolerance Patient tolerated treatment well   Patient Left in chair;with chair alarm set;with call bell/phone within reach   Nurse Communication Mobility status        Time: 7846-9629 OT Time Calculation (min): 25 min  Charges: OT General Charges $OT Visit: 1 Visit OT Treatments $Self Care/Home Management : 8-22 mins $Therapeutic Exercise: 8-22 mins  Alfonse Flavors, OTA Acute Rehabilitation Services  Office [phone]   Dewain Penning 08/07/2022, 1:00 PM

## 2022-08-08 DIAGNOSIS — Z96649 Presence of unspecified artificial hip joint: Secondary | ICD-10-CM | POA: Diagnosis not present

## 2022-08-08 DIAGNOSIS — M978XXA Periprosthetic fracture around other internal prosthetic joint, initial encounter: Secondary | ICD-10-CM | POA: Diagnosis not present

## 2022-08-08 LAB — CBC WITH DIFFERENTIAL/PLATELET
Abs Immature Granulocytes: 0.01 10*3/uL (ref 0.00–0.07)
Basophils Absolute: 0 10*3/uL (ref 0.0–0.1)
Basophils Relative: 0 %
Eosinophils Absolute: 0.1 10*3/uL (ref 0.0–0.5)
Eosinophils Relative: 2 %
HCT: 25.4 % — ABNORMAL LOW (ref 39.0–52.0)
Hemoglobin: 7.9 g/dL — ABNORMAL LOW (ref 13.0–17.0)
Immature Granulocytes: 0 %
Lymphocytes Relative: 25 %
Lymphs Abs: 1.3 10*3/uL (ref 0.7–4.0)
MCH: 27.1 pg (ref 26.0–34.0)
MCHC: 31.1 g/dL (ref 30.0–36.0)
MCV: 87.3 fL (ref 80.0–100.0)
Monocytes Absolute: 0.5 10*3/uL (ref 0.1–1.0)
Monocytes Relative: 9 %
Neutro Abs: 3.2 10*3/uL (ref 1.7–7.7)
Neutrophils Relative %: 64 %
Platelets: 190 10*3/uL (ref 150–400)
RBC: 2.91 MIL/uL — ABNORMAL LOW (ref 4.22–5.81)
RDW: 16.7 % — ABNORMAL HIGH (ref 11.5–15.5)
WBC: 5.1 10*3/uL (ref 4.0–10.5)
nRBC: 0 % (ref 0.0–0.2)

## 2022-08-08 LAB — BASIC METABOLIC PANEL
Anion gap: 6 (ref 5–15)
BUN: 20 mg/dL (ref 8–23)
CO2: 29 mmol/L (ref 22–32)
Calcium: 7.9 mg/dL — ABNORMAL LOW (ref 8.9–10.3)
Chloride: 102 mmol/L (ref 98–111)
Creatinine, Ser: 0.52 mg/dL — ABNORMAL LOW (ref 0.61–1.24)
GFR, Estimated: >60 mL/min (ref >60)
Glucose, Bld: 102 mg/dL — ABNORMAL HIGH (ref 70–99)
Potassium: 4.4 mmol/L (ref 3.5–5.1)
Sodium: 137 mmol/L (ref 135–145)

## 2022-08-08 NOTE — Progress Notes (Signed)
PROGRESS NOTE  James Sanford  DOB: 31-May-1955  PCP: Jerl Mina, MD ZOX:[id number]  DOA: 08/03/2022  LOS: 5 days  Hospital Day: 6  Brief narrative: James Sanford is a 67 y.o. male with PMH significant for prediabetes, HTN, HLD, OSA, A-fib on Eliquis., chronic alcoholism, h/o GI bleeding, chronic anemia, pancreatic insufficiency. 4/30, patient presented to the ED at [facility] after a fall and a leg injury. Per history, patient slipped and fell while walking up the stairs left leg followed by immediate significant pain. He denies hitting his head nor loss of consciousness.   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  Seen by orthopedics.  Given complex peri prosthetic fracture, patient was transferred to [facility] for surgical management. Seen by orthopedics Dr. Jena Gauss 5/1, underwent ORIF of left distal femur fracture  Subjective: Patient was seen and examined this morning.  Sitting up in recliner.  Not in distress no new symptoms per family not at bedside.  Pending SNF.  Assessment and plan: Periprosthetic left femur fracture S/p ORIF - 5/1 Dr. Jena Gauss Secondary to mechanical fall  Imaging as above.  Procedure as above. Continue pain management.  Currently on scheduled Tylenol, Norco PRN and Dilaudid PRN On long-term anticoagulation with Eliquis Seen by PT.  Pending SNF  Chronic anemia H/o GI bleeding It seems her baseline hemoglobin hovers around 10 Postop, hemoglobin is downtrending, 7.9 today. Likely secondary to blood loss from long bone fracture and surgery.  No bleeding elsewhere.  Continue to monitor.  Transfuse if less than 7. Recent Labs    08/03/22 0130 08/04/22 0738 08/05/22 0510 08/06/22 0503 08/08/22 0208  HGB 10.6* 9.3* 8.5* 8.2*  7.9*  MCV 90.6 88.8 89.1 88.7 87.3    A-fib Not on any AV nodal blocking agent due to history of bradycardia Chronically on Eliquis.  Resumed.  H/o HTN, HLD, prediabetes Not currently on any medication for these issues.  Continue to monitor   Chronic alcoholism Fatty liver No mention of cirrhosis in previous imaging from September 2023. Unclear why patient was on Xifaxan at home before.  Pancreatic insufficiency Seen recently for this with associated loose stool. Continue home Creon    OSA  Impaired mobility PT eval obtained.  SNF was recommended.   Goals of care   Code Status: Full Code     DVT prophylaxis:  Place and maintain sequential compression device Start: 08/05/22 0810 SCDs Start: 08/04/22 1308 apixaban (ELIQUIS) tablet 5 mg   Antimicrobials: None Fluid: Not on IV fluid Consultants: Orthopedics Family Communication: None at bedside  Status: Inpatient Level of care:  Telemetry Surgical   Patient from: Home Anticipated d/c to: medically stable for discharge, pending SNF    Diet:  Diet Order             Diet Heart Room service appropriate? Yes; Fluid consistency: Thin  Diet effective now                   Scheduled Meds:  apixaban  5 mg Oral BID   docusate sodium  100 mg Oral BID   lipase/protease/amylase  24,000 Units Oral TID AC   prednisoLONE acetate  1 drop Right Eye QID   Vitamin D (Ergocalciferol)  50,000 Units Oral Q7 days    PRN meds: acetaminophen, HYDROcodone-acetaminophen, HYDROcodone-acetaminophen, HYDROmorphone (DILAUDID)  injection, methocarbamol **OR** methocarbamol (ROBAXIN) IV, metoCLOPramide **OR** metoCLOPramide (REGLAN) injection, ondansetron **OR** ondansetron (ZOFRAN) IV, polyethylene glycol   Infusions:   methocarbamol (ROBAXIN) IV      Antimicrobials: Anti-infectives (From admission, onward)    Start     Dose/Rate Route Frequency Ordered Stop   08/04/22 1400  ceFAZolin (ANCEF) IVPB 2g/100 mL premix        2  g 200 mL/hr over 30 Minutes Intravenous Every 8 hours 08/04/22 1307 08/05/22 0909   08/04/22 1136  vancomycin (VANCOCIN) powder  Status:  Discontinued          As needed 08/04/22 1137 08/04/22 1206   08/04/22 0945  ceFAZolin (ANCEF) IVPB 2g/100 mL premix        2 g 200 mL/hr over 30 Minutes Intravenous On call to O.R. 08/04/22 0937 08/04/22 1347       Nutritional status:  Body mass index is 21.7 kg/m.          Objective: Vitals:   08/08/22 0606 08/08/22 0948  BP: 118/81 110/75  Pulse: 66 74  Resp: 16 17  Temp: 97.8 F (36.6 C) 98.4 F (36.9 C)  SpO2: 99% 98%    Intake/Output Summary (Last 24 hours) at 08/08/2022 1147 Last data filed at 08/08/2022 1610 Gross per 24 hour  Intake 480 ml  Output 1525 ml  Net -1045 ml    Filed Weights   08/04/22 0928  Weight: 72.6 kg   Weight change:  Body mass index is 21.7 kg/m.   Physical Exam: General exam: Pleasant, not in distress.  No new symptoms Skin: No rashes, lesions or ulcers. HEENT: Atraumatic, normocephalic, no obvious bleeding Lungs: Clear to auscultation bilaterally CVS: Regular rate and rhythm, no murmur GI/Abd soft, nontender, nondistended, bowel sound present CNS: Alert, awake, oriented x 3 Psychiatry: Mood appropriate Extremities: No pedal edema, no calf tenderness  Data Review: I have personally reviewed the laboratory data and studies available.  F/u labs ordered Unresulted Labs (From admission, onward)    None       Total time spent in review of labs and imaging, patient evaluation, formulation of plan, documentation and communication with family: 25 minutes  Signed, Lorin Glass, MD [facility] 08/08/2022

## 2022-08-08 NOTE — Progress Notes (Signed)
Pt refusing SCD's; explained to pt the benefits of using SCD's while in the hospital and the risks involved in not using them. Pt acknowledged the information, but still refused. Encouraged pt to perform frequent dorsi/plantar flexion of calves to promote lower extremity circulation so as to avoid DVT's while admitted. Pt acknowledged this information.

## 2022-08-09 DIAGNOSIS — Z96649 Presence of unspecified artificial hip joint: Secondary | ICD-10-CM | POA: Diagnosis not present

## 2022-08-09 DIAGNOSIS — M978XXA Periprosthetic fracture around other internal prosthetic joint, initial encounter: Secondary | ICD-10-CM | POA: Diagnosis not present

## 2022-08-09 MED ORDER — RIFAXIMIN 550 MG PO TABS
550.0000 mg | ORAL_TABLET | Freq: Three times a day (TID) | ORAL | Status: DC
  Administered 2022-08-09 – 2022-08-11 (×6): 550 mg via ORAL
  Filled 2022-08-09 (×8): qty 1

## 2022-08-09 MED ORDER — PANCRELIPASE (LIP-PROT-AMYL) 36000-114000 UNITS PO CPEP
48000.0000 [IU] | ORAL_CAPSULE | Freq: Four times a day (QID) | ORAL | Status: DC
  Administered 2022-08-09 – 2022-08-11 (×8): 48000 [IU] via ORAL
  Filled 2022-08-09 (×8): qty 1

## 2022-08-09 NOTE — TOC Progression Note (Signed)
Transition of Care [facility]) - Progression Note    Patient Details  Name: James Sanford MRN: [id number] Date of Birth: [birth year]  [facility]) CM/SW Contact  Lorri Frederick, LCSW Phone Number: 08/09/2022, 3:50 PM  Clinical Narrative:   CSW spoke with pt about bed offers, he asked CSW to speak with his wife.  1100: Wife asked to speak with CSW in the hallway, bed offers provided, she would like additional, higher rated options, does not mind if it is some distance away.  She had a list of several places in [place] [employer].  CSW sent referral to additional higher rated SNFs.  1530: offers from [place] [place] rehab given to pt, he again asked CSW to speak with wife.  Called wife, provided offers, she would like to stop by [facility].  CSW reached out to [employer], [place], [facility] [place] requesting responses.      Expected Discharge Plan: Home w Home Health Services Barriers to Discharge: Continued Medical Work up  Expected Discharge Plan and Services In-house Referral: Clinical Social Work   Post Acute Care Choice:  (TBD) Living arrangements for the past 2 months: Single Family Home                                       Social Determinants of Health (SDOH) Interventions SDOH Screenings   Tobacco Use: Medium Risk (08/05/2022)    Readmission Risk Interventions     No data to display

## 2022-08-09 NOTE — Progress Notes (Signed)
Mobility Specialist Progress Note   08/09/22 1148  Mobility  Activity Repositioned in chair (Chair level exercise)  Level of Assistance Standby assist, set-up cues, supervision of patient - no hands on  Assistive Device None  Range of Motion/Exercises Active;All extremities  Activity Response Tolerated well   Patient received in recliner and agreeable to participate. Deferred getting back in bed or ambulation. Opted to BLE chair level exercise. Completed with supervision + increased time second to pain. Tolerated without complaint or incident. Was left in recliner with all needs met, call bell in reach.   James Sanford, BS EXP Mobility Specialist Please contact via SecureChat or Rehab office at [phone]

## 2022-08-09 NOTE — Progress Notes (Signed)
PROGRESS NOTE  James Sanford  DOB: 05/25/1955  PCP: Jerl Mina, MD WGN:[id number]  DOA: 08/03/2022  LOS: 6 days  Hospital Day: 7  Brief narrative: James Sanford is a 67 y.o. male with PMH significant for prediabetes, HTN, HLD, OSA, A-fib on Eliquis., chronic alcoholism, h/o GI bleeding, chronic anemia, pancreatic insufficiency. 4/30, patient presented to the ED at [facility] after a fall and a leg injury. Per history, patient slipped and fell while walking up the stairs left leg followed by immediate significant pain. He denies hitting his head nor loss of consciousness.   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  Seen by orthopedics.  Given complex peri prosthetic fracture, patient was transferred to [facility] for surgical management. Seen by orthopedics Dr. Jena Gauss 5/1, underwent ORIF of left distal femur fracture  Subjective: Patient was seen and examined this morning.  Sitting up in recliner.  Not in distress.  No new symptoms.  His wife has not yet made decision on SNF choice.  Assessment and plan: Periprosthetic left femur fracture S/p ORIF - 5/1 Dr. Jena Gauss Secondary to mechanical fall  Imaging as above. Procedure as above. Continue pain management. Currently on scheduled Tylenol, Norco PRN and Dilaudid PRN On long-term anticoagulation with Eliquis Seen by PT.  Pending SNF  Chronic anemia H/o GI bleeding It seems her baseline hemoglobin hovers around 10 Postop, hemoglobin is downtrending, 7.9 on last check on 5/5. Likely secondary to blood loss from long bone fracture and surgery.  No bleeding elsewhere.  Continue to monitor.  Transfuse if less than 7. Recent Labs    08/03/22 0130 08/04/22 0738 08/05/22 0510 08/06/22 0503 08/08/22 0208   HGB 10.6* 9.3* 8.5* 8.2* 7.9*  MCV 90.6 88.8 89.1 88.7 87.3    A-fib Not on any AV nodal blocking agent due to history of bradycardia Chronically on Eliquis.  Resumed.  H/o HTN, HLD, prediabetes Not currently on any medication for these issues.  Continue to monitor   Chronic alcoholism Fatty liver No mention of cirrhosis in previous imaging from September 2023. Unclear why patient was on Xifaxan at home before.  Pancreatic insufficiency Seen recently for this with associated loose stool. Continue home Creon    OSA  Impaired mobility PT eval obtained.  SNF was recommended.   Goals of care   Code Status: Full Code     DVT prophylaxis:  Place and maintain sequential compression device Start: 08/05/22 0810 SCDs Start: 08/04/22 1308 apixaban (ELIQUIS) tablet 5 mg   Antimicrobials: None Fluid: Not on IV fluid Consultants: Orthopedics Family Communication: None at bedside  Status: Inpatient Level of care:  Telemetry Surgical   Patient from: Home Anticipated d/c to: medically stable for discharge, pending SNF    Diet:  Diet Order             Diet Heart Room service appropriate? Yes; Fluid consistency: Thin  Diet effective now                   Scheduled Meds:  apixaban  5 mg Oral BID   docusate sodium  100 mg Oral BID   lipase/protease/amylase  24,000 Units Oral TID AC   prednisoLONE acetate  1 drop Right Eye QID   Vitamin D (Ergocalciferol)  50,000 Units Oral Q7 days    PRN  meds: acetaminophen, HYDROcodone-acetaminophen, HYDROcodone-acetaminophen, HYDROmorphone (DILAUDID) injection, methocarbamol **OR** methocarbamol (ROBAXIN) IV, metoCLOPramide **OR** metoCLOPramide (REGLAN) injection, ondansetron **OR** ondansetron (ZOFRAN) IV, polyethylene glycol   Infusions:   methocarbamol (ROBAXIN) IV      Antimicrobials: Anti-infectives (From admission, onward)    Start     Dose/Rate Route Frequency Ordered Stop   08/04/22 1400  ceFAZolin (ANCEF) IVPB  2g/100 mL premix        2 g 200 mL/hr over 30 Minutes Intravenous Every 8 hours 08/04/22 1307 08/05/22 0909   08/04/22 1136  vancomycin (VANCOCIN) powder  Status:  Discontinued          As needed 08/04/22 1137 08/04/22 1206   08/04/22 0945  ceFAZolin (ANCEF) IVPB 2g/100 mL premix        2 g 200 mL/hr over 30 Minutes Intravenous On call to O.R. 08/04/22 0937 08/04/22 1347       Nutritional status:  Body mass index is 21.7 kg/m.          Objective: Vitals:   08/09/22 0354 08/09/22 0807  BP: 107/70 102/72  Pulse: 68 76  Resp: 17 18  Temp:  98 F (36.7 C)  SpO2: 100% 98%    Intake/Output Summary (Last 24 hours) at 08/09/2022 1111 Last data filed at 08/09/2022 0200 Gross per 24 hour  Intake 240 ml  Output 700 ml  Net -460 ml    Filed Weights   08/04/22 0928  Weight: 72.6 kg   Weight change:  Body mass index is 21.7 kg/m.   Physical Exam: General exam: Pleasant, not in distress.  No new symptoms Skin: No rashes, lesions or ulcers. HEENT: Atraumatic, normocephalic, no obvious bleeding Lungs: Clear to auscultation bilaterally CVS: Regular rate and rhythm, no murmur GI/Abd soft, nontender, nondistended, bowel sound present CNS: Alert, awake, oriented x 3 Psychiatry: Mood appropriate Extremities: No pedal edema, no calf tenderness  Data Review: I have personally reviewed the laboratory data and studies available.  F/u labs ordered Unresulted Labs (From admission, onward)    None       Total time spent in review of labs and imaging, patient evaluation, formulation of plan, documentation and communication with family: 25 minutes  Signed, Lorin Glass, MD [facility] 08/09/2022

## 2022-08-09 NOTE — Progress Notes (Signed)
Physical Therapy Treatment Patient Details Name: James Sanford MRN: [id number] DOB: 08-07-1955 Today's Date: 08/09/2022   History of Present Illness Pt is a 67 y/o M admitted on 08/03/22 after presenting with c/o a fall after slipping on the stairs. X-ray showed periprosthetic distal L femur fx. Pt underwent ORIF of L distal femur fx & removal of hardware by Dr. Jena Gauss on 08/04/22. PMH: a-fib, anemia, arthritis, HLD, HTN, memory deficit, pre-diabetes, sleep apnea, obesity, ethanol use, GI bleed    PT Comments    Pt received in supine and agreeable to session. Pt demonstrating increased agitation at the beginning of the session due to not being able to use the [facility] when needed and being woken up throughout the night. Pt able to perform bed mobility with supervision demonstrating increased pain with LLE advancement to EOB. Pt impulsive with movement once EOB, attempting to stand before RW was in front of him or gait belt donned. Once standing, pt becoming frustrated and hitting RW on the floor. Pt able to pivot to recliner and became more calm after a rest break. Pt able to perform gait trial, however reporting increased LLE pain during a step and requesting to return to the room. Pt reporting improvement in pain once repositioned in recliner. Pt continues to benefit from PT services to progress toward functional mobility goals.     Recommendations for follow up therapy are one component of a multi-disciplinary discharge planning process, led by the attending physician.  Recommendations may be updated based on patient status, additional functional criteria and insurance authorization.     Assistance Recommended at Discharge Intermittent Supervision/Assistance  Patient can return home with the following A little help with walking and/or transfers;A little help with bathing/dressing/bathroom;Assistance with cooking/housework;Direct supervision/assist for medications management;Direct  supervision/assist for financial management;Assist for transportation;Help with stairs or ramp for entrance   Equipment Recommendations  None recommended by PT    Recommendations for Other Services       Precautions / Restrictions Precautions Precautions: Fall Restrictions Weight Bearing Restrictions: Yes LLE Weight Bearing: Weight bearing as tolerated     Mobility  Bed Mobility Overal bed mobility: Needs Assistance Bed Mobility: Supine to Sit     Supine to sit: Supervision     General bed mobility comments: Increased time    Transfers Overall transfer level: Needs assistance Equipment used: Rolling walker (2 wheels) Transfers: Sit to/from Stand Sit to Stand: Min guard   Step pivot transfers: Min guard       General transfer comment: from EOB x2 and recliner x1 with min guard for safety. Pt able to step pivot to recliner, but becoming agitated and picking up RW multiple times to hit on the ground, but no LOB.    Ambulation/Gait Ambulation/Gait assistance: Min guard, Supervision Gait Distance (Feet): 50 Feet Assistive device: Rolling walker (2 wheels) Gait Pattern/deviations: Step-through pattern, Antalgic, Trunk flexed, Decreased stance time - left       General Gait Details: Cues for upright posture and RW proximity. Pt reporting increased pain during a step requiring a standing recovery break and pt requesting to return to the room. Cues for pacing due to pt moving quickly.      Balance Overall balance assessment: Needs assistance Sitting-balance support: Feet supported Sitting balance-Leahy Scale: Good Sitting balance - Comments: sitting EOB   Standing balance support: During functional activity, Bilateral upper extremity supported, Reliant on assistive device for balance Standing balance-Leahy Scale: Fair Standing balance comment: with RW support  Cognition Arousal/Alertness: Awake/alert Behavior During  Therapy: Agitated, Impulsive Overall Cognitive Status: History of cognitive impairments - at baseline                                 General Comments: Pt agitated at beginning of session leading to impulsivity requiring cues for pacing and safety.        Exercises      General Comments        Pertinent Vitals/Pain Pain Assessment Pain Assessment: Faces Faces Pain Scale: Hurts even more Pain Location: LLE Pain Descriptors / Indicators: Grimacing, Guarding, Moaning Pain Intervention(s): Monitored during session, Limited activity within patient's tolerance, Repositioned     PT Goals (current goals can now be found in the care plan section) Acute Rehab PT Goals Patient Stated Goal: walk a little more PT Goal Formulation: With patient Time For Goal Achievement: 08/18/22 Potential to Achieve Goals: Good Progress towards PT goals: Progressing toward goals    Frequency    Min 4X/week      PT Plan Current plan remains appropriate       AM-PAC PT "6 Clicks" Mobility   Outcome Measure  Help needed turning from your back to your side while in a flat bed without using bedrails?: None Help needed moving from lying on your back to sitting on the side of a flat bed without using bedrails?: A Little Help needed moving to and from a bed to a chair (including a wheelchair)?: A Little Help needed standing up from a chair using your arms (e.g., wheelchair or bedside chair)?: A Little Help needed to walk in hospital room?: A Little Help needed climbing 3-5 steps with a railing? : A Lot 6 Click Score: 18    End of Session Equipment Utilized During Treatment: Gait belt Activity Tolerance: Patient tolerated treatment well Patient left: in chair;with chair alarm set;with call bell/phone within reach Nurse Communication: Mobility status;Patient requests pain meds PT Visit Diagnosis: Difficulty in walking, not elsewhere classified (R26.2);Muscle weakness (generalized)  (M62.81);Other abnormalities of gait and mobility (R26.89);Unsteadiness on feet (R26.81)     Time: 7846-9629 PT Time Calculation (min) (ACUTE ONLY): 17 min  Charges:  $Gait Training: 8-22 mins                     Johny Shock, PTA Acute Rehabilitation Services Secure Chat Preferred  [phone]    Johny Shock 08/09/2022, 9:19 AM

## 2022-08-10 DIAGNOSIS — Z96649 Presence of unspecified artificial hip joint: Secondary | ICD-10-CM | POA: Diagnosis not present

## 2022-08-10 DIAGNOSIS — M978XXA Periprosthetic fracture around other internal prosthetic joint, initial encounter: Secondary | ICD-10-CM | POA: Diagnosis not present

## 2022-08-10 LAB — CBC
HCT: 26.8 % — ABNORMAL LOW (ref 39.0–52.0)
Hemoglobin: 8.4 g/dL — ABNORMAL LOW (ref 13.0–17.0)
MCH: 27 pg (ref 26.0–34.0)
MCHC: 31.3 g/dL (ref 30.0–36.0)
MCV: 86.2 fL (ref 80.0–100.0)
Platelets: 291 10*3/uL (ref 150–400)
RBC: 3.11 MIL/uL — ABNORMAL LOW (ref 4.22–5.81)
RDW: 16.7 % — ABNORMAL HIGH (ref 11.5–15.5)
WBC: 8.1 10*3/uL (ref 4.0–10.5)
nRBC: 0 % (ref 0.0–0.2)

## 2022-08-10 LAB — TYPE AND SCREEN
ABO/RH(D): A POS
Antibody Screen: NEGATIVE

## 2022-08-10 NOTE — Progress Notes (Signed)
PROGRESS NOTE  James Sanford  DOB: July 26, 1955  PCP: Jerl Mina, MD ZOX:[id number]  DOA: 08/03/2022  LOS: 7 days  Hospital Day: 8  Brief narrative: James Sanford is a 68 y.o. male with PMH significant for prediabetes, HTN, HLD, OSA, A-fib on Eliquis., chronic alcoholism, h/o GI bleeding, chronic anemia, pancreatic insufficiency. 4/30, patient presented to the ED at [facility] after a fall and a leg injury. Per history, patient slipped and fell while walking up the stairs left leg followed by immediate significant pain. He denies hitting his head nor loss of consciousness.   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  Seen by orthopedics.  Given complex peri prosthetic fracture, patient was transferred to [facility] for surgical management. Seen by orthopedics Dr. Jena Gauss 5/1, underwent ORIF of left distal femur fracture  Subjective: Patient was seen and examined this morning.  Sitting up in bed.  Not in distress.  No new symptoms.  Blood work this morning showed improvement in hemoglobin. Pending SNF choice by wife.  Assessment and plan: Periprosthetic left femur fracture S/p ORIF - 5/1 Dr. Jena Gauss Secondary to mechanical fall  Imaging as above. Procedure as above. Continue pain management. Currently on scheduled Tylenol, Norco PRN and Dilaudid PRN On long-term anticoagulation with Eliquis Seen by PT.  Pending SNF.  Chronic anemia H/o GI bleeding It seems her baseline hemoglobin hovers around 10 Postop, hemoglobin level was monitored.  Slight drop noted because of long bone fracture and surgery.  Hemoglobin stable at 8.4 today.  Trend as below.  Recent Labs    08/04/22 0738 08/05/22 0510 08/06/22 0503 08/08/22 0208 08/10/22 1054  HGB 9.3* 8.5*  8.2* 7.9* 8.4*  MCV 88.8 89.1 88.7 87.3 86.2    A-fib Not on any AV nodal blocking agent due to history of bradycardia Chronically on Eliquis.  Resumed.  H/o HTN, HLD, prediabetes Not currently on any medication for these issues. Continue to monitor.   Chronic alcoholism Fatty liver No mention of cirrhosis in previous imaging from September 2023. It seems patient was prescribed on a 2-week course of Xifaxan by outpatient provider on 4/24.  Okay to continue while in the hospital.  No need to continue postdischarge.  Pancreatic insufficiency Seen recently for this with associated loose stool. Continue home Creon    OSA  Impaired mobility PT eval obtained.  SNF was recommended.   Goals of care   Code Status: Full Code     DVT prophylaxis:  Place and maintain sequential compression device Start: 08/05/22 0810 SCDs Start: 08/04/22 1308 apixaban (ELIQUIS) tablet 5 mg   Antimicrobials: None Fluid: Not on IV fluid Consultants: Orthopedics Family Communication: None at bedside  Status: Inpatient Level of care:  Med-Surg   Patient from: Home Anticipated d/c to: medically stable for discharge, pending SNF    Diet:  Diet Order             Diet Heart Room service appropriate? Yes; Fluid consistency: Thin  Diet effective now                   Scheduled Meds:  apixaban  5 mg Oral BID   docusate sodium  100 mg Oral BID   lipase/protease/amylase  48,000 Units Oral QID   prednisoLONE acetate  1 drop Right Eye QID  rifaximin  550 mg Oral TID   Vitamin D (Ergocalciferol)  50,000 Units Oral Q7 days    PRN meds: acetaminophen, HYDROcodone-acetaminophen, HYDROcodone-acetaminophen, HYDROmorphone (DILAUDID) injection, methocarbamol **OR** methocarbamol (ROBAXIN) IV, metoCLOPramide **OR** metoCLOPramide (REGLAN) injection, ondansetron **OR** ondansetron (ZOFRAN) IV, polyethylene glycol   Infusions:   methocarbamol (ROBAXIN) IV      Antimicrobials: Anti-infectives  (From admission, onward)    Start     Dose/Rate Route Frequency Ordered Stop   08/09/22 1600  rifaximin (XIFAXAN) tablet 550 mg        550 mg Oral 3 times daily 08/09/22 1334 08/23/22 1559   08/04/22 1400  ceFAZolin (ANCEF) IVPB 2g/100 mL premix        2 g 200 mL/hr over 30 Minutes Intravenous Every 8 hours 08/04/22 1307 08/05/22 0909   08/04/22 1136  vancomycin (VANCOCIN) powder  Status:  Discontinued          As needed 08/04/22 1137 08/04/22 1206   08/04/22 0945  ceFAZolin (ANCEF) IVPB 2g/100 mL premix        2 g 200 mL/hr over 30 Minutes Intravenous On call to O.R. 08/04/22 0937 08/04/22 1347       Nutritional status:  Body mass index is 21.7 kg/m.          Objective: Vitals:   08/10/22 0414 08/10/22 0715  BP: 103/73 93/72  Pulse: 75   Resp: 15 18  Temp: 98.3 F (36.8 C) 98.8 F (37.1 C)  SpO2: 100%     Intake/Output Summary (Last 24 hours) at 08/10/2022 1325 Last data filed at 08/10/2022 1221 Gross per 24 hour  Intake 960 ml  Output 1700 ml  Net -740 ml    Filed Weights   08/04/22 0928  Weight: 72.6 kg   Weight change:  Body mass index is 21.7 kg/m.   Physical Exam: General exam: Pleasant, not in distress.  No new symptoms Skin: No rashes, lesions or ulcers. HEENT: Atraumatic, normocephalic, no obvious bleeding Lungs: Clear to auscultation bilaterally CVS: Regular rate and rhythm, no murmur GI/Abd soft, nontender, nondistended, bowel sound present CNS: Alert, awake, oriented x 3 Psychiatry: Mood appropriate Extremities: No pedal edema, no calf tenderness  Data Review: I have personally reviewed the laboratory data and studies available.  F/u labs ordered Unresulted Labs (From admission, onward)    None       Total time spent in review of labs and imaging, patient evaluation, formulation of plan, documentation and communication with family: 25 minutes  Signed, Lorin Glass, MD [facility] 08/10/2022

## 2022-08-10 NOTE — Progress Notes (Signed)
Pt. Refused Am labs. This nurse explained the importance of lab draw. Refused x2

## 2022-08-10 NOTE — TOC Progression Note (Addendum)
Transition of Care [facility]) - Progression Note    Patient Details  Name: James Sanford MRN: [id number] Date of Birth: 06/22/1955  Transition of Care [facility]) CM/SW Contact  Lorri Frederick, LCSW Phone Number: 08/10/2022, 10:09 AM  Clinical Narrative:   [facility] does offer bed, CSW informed pt wife James Sanford.  She is going to go by to see the facility  1500:Message from Soy/Shannon Gray.  Wife signed admission paperwork.    Attempted to call wife, no answer.  Auth request submitted in [facility].      Expected Discharge Plan: Home w Home Health Services Barriers to Discharge: Continued Medical Work up  Expected Discharge Plan and Services In-house Referral: Clinical Social Work   Post Acute Care Choice:  (TBD) Living arrangements for the past 2 months: Single Family Home                                       Social Determinants of Health (SDOH) Interventions SDOH Screenings   Tobacco Use: Medium Risk (08/05/2022)    Readmission Risk Interventions     No data to display

## 2022-08-10 NOTE — Progress Notes (Signed)
Physical Therapy Treatment Patient Details Name: James Sanford MRN: [id number] DOB: 12/20/1955 Today's Date: 08/10/2022   History of Present Illness Pt is a 67 y/o M admitted on 08/03/22 after presenting with c/o a fall after slipping on the stairs. X-ray showed periprosthetic distal L femur fx. Pt underwent ORIF of L distal femur fx & removal of hardware by Dr. Jena Gauss on 08/04/22. PMH: a-fib, anemia, arthritis, HLD, HTN, memory deficit, pre-diabetes, sleep apnea, obesity, ethanol use, GI bleed    PT Comments    Pt was received in supine and agreeable to bed level exercises, declining OOB mobility due to headache. Pt able to perform exercises with intermittent assist and cues for technique. Pt encouraged to perform exercises independently throughout the day for strengthening. Pt agreeable to sit EOB for exercises and initiated a stand, however sat back to the EOB and stated "I'm going to fall". Pt requesting to return to supine and began rubbing eyebrows due to "eyes hurting", RN aware. Pt continues to benefit from PT services to progress toward functional mobility goals.    Recommendations for follow up therapy are one component of a multi-disciplinary discharge planning process, led by the attending physician.  Recommendations may be updated based on patient status, additional functional criteria and insurance authorization.     Assistance Recommended at Discharge Intermittent Supervision/Assistance  Patient can return home with the following A little help with walking and/or transfers;A little help with bathing/dressing/bathroom;Assistance with cooking/housework;Direct supervision/assist for medications management;Direct supervision/assist for financial management;Assist for transportation;Help with stairs or ramp for entrance   Equipment Recommendations  None recommended by PT    Recommendations for Other Services       Precautions / Restrictions Precautions Precautions:  Fall Restrictions Weight Bearing Restrictions: Yes LLE Weight Bearing: Weight bearing as tolerated     Mobility  Bed Mobility Overal bed mobility: Needs Assistance Bed Mobility: Supine to Sit, Sit to Supine     Supine to sit: Supervision Sit to supine: Min assist   General bed mobility comments: min A to elevate LLE to EOB when returning to supine    Transfers                   General transfer comment: Pt declined OOB mobility due to headache        Balance Overall balance assessment: Needs assistance Sitting-balance support: Feet supported Sitting balance-Leahy Scale: Good Sitting balance - Comments: sitting EOB                                    Cognition Arousal/Alertness: Awake/alert Behavior During Therapy: Anxious Overall Cognitive Status: History of cognitive impairments - at baseline                                          Exercises General Exercises - Lower Extremity Quad Sets: AROM, 5 reps, Both, Supine Long Arc Quad: AROM, Seated, Both, 10 reps Heel Slides: AROM, Supine, Both, 5 reps Straight Leg Raises: AROM, AAROM, Supine, Both, 5 reps Hip Flexion/Marching: AROM, Both, Seated, 10 reps    General Comments        Pertinent Vitals/Pain Pain Assessment Pain Assessment: Faces Faces Pain Scale: Hurts even more Pain Location: Headache Pain Descriptors / Indicators: Grimacing, Guarding, Moaning Pain Intervention(s): Limited activity within patient's tolerance, Monitored during session  PT Goals (current goals can now be found in the care plan section) Acute Rehab PT Goals Patient Stated Goal: walk a little more PT Goal Formulation: With patient Time For Goal Achievement: 08/18/22 Potential to Achieve Goals: Good Progress towards PT goals: Progressing toward goals    Frequency    Min 4X/week      PT Plan Current plan remains appropriate       AM-PAC PT "6 Clicks" Mobility   Outcome  Measure  Help needed turning from your back to your side while in a flat bed without using bedrails?: None Help needed moving from lying on your back to sitting on the side of a flat bed without using bedrails?: A Little Help needed moving to and from a bed to a chair (including a wheelchair)?: A Little Help needed standing up from a chair using your arms (e.g., wheelchair or bedside chair)?: A Little Help needed to walk in hospital room?: A Little Help needed climbing 3-5 steps with a railing? : A Lot 6 Click Score: 18    End of Session   Activity Tolerance: Patient tolerated treatment well Patient left: in bed;with bed alarm set;with call bell/phone within reach Nurse Communication: Mobility status PT Visit Diagnosis: Difficulty in walking, not elsewhere classified (R26.2);Muscle weakness (generalized) (M62.81);Other abnormalities of gait and mobility (R26.89);Unsteadiness on feet (R26.81)     Time: 6295-2841 PT Time Calculation (min) (ACUTE ONLY): 16 min  Charges:  $Therapeutic Exercise: 8-22 mins                     Johny Shock, PTA Acute Rehabilitation Services Secure Chat Preferred  [phone]    Johny Shock 08/10/2022, 4:03 PM

## 2022-08-10 NOTE — Progress Notes (Signed)
Verbal order for CBC and type and screen STAT per MD Dahal, Melina Schools

## 2022-08-10 NOTE — Progress Notes (Signed)
OT Cancellation Note  Patient Details Name: James Sanford MRN: [id number] DOB: 1955-10-20   Cancelled Treatment:    Reason Eval/Treat Not Completed: Patient declined, no reason specified Pt refused OOB activities, in bed activities, stated he already walked and did exercises once today with PT, insisted that was enough, said he would do it tomorrow.   Alexis Goodell 08/10/2022, 5:15 PM

## 2022-08-11 DIAGNOSIS — M978XXA Periprosthetic fracture around other internal prosthetic joint, initial encounter: Secondary | ICD-10-CM | POA: Diagnosis not present

## 2022-08-11 DIAGNOSIS — Z96649 Presence of unspecified artificial hip joint: Secondary | ICD-10-CM | POA: Diagnosis not present

## 2022-08-11 MED ORDER — POLYETHYLENE GLYCOL 3350 17 G PO PACK: 17.0000 g | PACK | Freq: Every day | ORAL | 0 refills | Status: AC | PRN

## 2022-08-11 MED ORDER — DOCUSATE SODIUM 100 MG PO CAPS: 100.0000 mg | ORAL_CAPSULE | Freq: Two times a day (BID) | ORAL | 0 refills | Status: DC

## 2022-08-11 NOTE — TOC Progression Note (Addendum)
Transition of Care [facility]) - Progression Note    Patient Details  Name: James Sanford MRN: [id number] Date of Birth: 1955-11-30  Transition of Care [facility]) CM/SW Contact  Lorri Frederick, LCSW Phone Number: 08/11/2022, 8:20 AM  Clinical Narrative:   SNF auth approved: [id number], [id number], 3 days: 5/8-5/10.  CSW confirmed with [facility] that they can receive pt today.  Pt on expensive medication: rifaximin.  CSW confirmed with [facility] that pt will provide this from home, which she does confirm.  MD notified.    1025: CSW spoke with wife James Sanford.  She will bring medication to SNF.  Discussed pt does need transportation to SNF--she can provide if she has help getting pt into the vehicle.  Expected Discharge Plan: Home w Home Health Services Barriers to Discharge: Continued Medical Work up  Expected Discharge Plan and Services In-house Referral: Clinical Social Work   Post Acute Care Choice:  (TBD) Living arrangements for the past 2 months: Single Family Home                                       Social Determinants of Health (SDOH) Interventions SDOH Screenings   Tobacco Use: Medium Risk (08/05/2022)    Readmission Risk Interventions     No data to display

## 2022-08-11 NOTE — Progress Notes (Signed)
OT Cancellation Note  Patient Details Name: Manny Wollert MRN: [id number] DOB: May 28, 1955   Cancelled Treatment:    Reason Eval/Treat Not Completed: Patient declined, no reason specified. Pt declining stating he wants to wait to do anything else until his wife arrives to take him home.   Tyler Deis, OTR/L [facility] Acute Rehabilitation Office: [phone]   Myrla Halsted 08/11/2022, 3:35 PM

## 2022-08-11 NOTE — Plan of Care (Signed)
AVS Reviewed and placed in discharge packet.  Left with patient.  All questions answered

## 2022-08-11 NOTE — TOC Transition Note (Signed)
Transition of Care [facility]) - CM/SW Discharge Note   Patient Details  Name: James Sanford MRN: [id number] Date of Birth: [birth year]  Transition of Care [facility]) CM/SW Contact:  Lorri Frederick, LCSW Phone Number: 08/11/2022, 12:08 PM   Clinical Narrative:   Pt [name], room 211, [place] [facility] .  RN call report to [phone]. (Main number: [phone])  Wife will transport, pt will need to be brought down to main entrance and will need assistance getting into vehicle.    Final next level of care: Skilled Nursing Facility Barriers to Discharge: Barriers Resolved   Patient Goals and CMS Choice      Discharge Placement                [facility] at[name]) Patient to be transferred to facility by: wife [name] Name of family member notified: wife [name] Patient and family notified of of transfer: 08/11/22  Discharge Plan and Services Additional resources added to the After Visit Summary for   In-house Referral: Clinical Social Work   Post Acute Care Choice:  (TBD)                               Social Determinants of Health (SDOH) Interventions SDOH Screenings   Tobacco Use: Medium Risk (08/05/2022)     Readmission Risk Interventions     No data to display

## 2022-08-11 NOTE — Plan of Care (Signed)

## 2022-08-11 NOTE — Progress Notes (Signed)
Physical Therapy Treatment Patient Details Name: James Sanford MRN: [id number] DOB: [birth year] Today's Date: 08/11/2022   History of Present Illness Pt is a 67 y/o M admitted on 08/03/22 after presenting with c/o a fall after slipping on the stairs. X-ray showed periprosthetic distal L femur fx. Pt underwent ORIF of L distal femur fx & removal of hardware by Dr. Jena Gauss on 08/04/22. PMH: a-fib, anemia, arthritis, HLD, HTN, memory deficit, pre-diabetes, sleep apnea, obesity, ethanol use, GI bleed    PT Comments    Pt received in supine and agreeable to session. Pt agitated and upset at beginning of session. Pt becoming more calm once sitting EOB and taking deep breaths. Pt reporting need to eat breakfast and feeling weak. Pt able to stand and take steps to the recliner with up to min A. Pt deferred further ambulation this session in order to eat breakfast. Pt encouraged to mobilize with therapy later today. Pt continues to benefit from PT services to progress toward functional mobility goals.     Recommendations for follow up therapy are one component of a multi-disciplinary discharge planning process, led by the attending physician.  Recommendations may be updated based on patient status, additional functional criteria and insurance authorization.     Assistance Recommended at Discharge Intermittent Supervision/Assistance  Patient can return home with the following A little help with walking and/or transfers;A little help with bathing/dressing/bathroom;Assistance with cooking/housework;Direct supervision/assist for medications management;Direct supervision/assist for financial management;Assist for transportation;Help with stairs or ramp for entrance   Equipment Recommendations  None recommended by PT    Recommendations for Other Services       Precautions / Restrictions Precautions Precautions: Fall Restrictions Weight Bearing Restrictions: Yes LLE Weight Bearing: Weight bearing as  tolerated     Mobility  Bed Mobility Overal bed mobility: Needs Assistance Bed Mobility: Supine to Sit     Supine to sit: Supervision     General bed mobility comments: Supervision for safety due to pt being agitated and impulsive with mobility.    Transfers Overall transfer level: Needs assistance Equipment used: Rolling walker (2 wheels) Transfers: Sit to/from Stand, Bed to chair/wheelchair/BSC Sit to Stand: Min assist, From elevated surface   Step pivot transfers: Min guard       General transfer comment: Min A for power up from elevated EOB and min guard for step pivot for safety. Pt demonstrating good hand placement without cues    Ambulation/Gait               General Gait Details: Pt deferred      Balance Overall balance assessment: Needs assistance Sitting-balance support: Feet supported Sitting balance-Leahy Scale: Good Sitting balance - Comments: sitting EOB   Standing balance support: During functional activity, Bilateral upper extremity supported, Reliant on assistive device for balance Standing balance-Leahy Scale: Fair Standing balance comment: with RW support                            Cognition Arousal/Alertness: Awake/alert Behavior During Therapy: Anxious Overall Cognitive Status: History of cognitive impairments - at baseline                                 General Comments: Pt upset and focused on needing to eat breakfast throughout session.        Exercises General Exercises - Lower Extremity Long Arc Quad: AROM, Seated, Both, 10 reps  Hip Flexion/Marching: AROM, Both, Seated, 10 reps    General Comments        Pertinent Vitals/Pain Pain Assessment Pain Assessment: Faces Faces Pain Scale: Hurts little more Pain Location: LLE with ambulation Pain Descriptors / Indicators: Guarding Pain Intervention(s): Monitored during session     PT Goals (current goals can now be found in the care plan  section) Acute Rehab PT Goals Patient Stated Goal: walk a little more PT Goal Formulation: With patient Time For Goal Achievement: 08/18/22 Potential to Achieve Goals: Good Progress towards PT goals: Progressing toward goals    Frequency    Min 4X/week      PT Plan Current plan remains appropriate       AM-PAC PT "6 Clicks" Mobility   Outcome Measure  Help needed turning from your back to your side while in a flat bed without using bedrails?: None Help needed moving from lying on your back to sitting on the side of a flat bed without using bedrails?: A Little Help needed moving to and from a bed to a chair (including a wheelchair)?: A Little Help needed standing up from a chair using your arms (e.g., wheelchair or bedside chair)?: A Little Help needed to walk in hospital room?: A Little Help needed climbing 3-5 steps with a railing? : A Lot 6 Click Score: 18    End of Session Equipment Utilized During Treatment: Gait belt Activity Tolerance: Patient tolerated treatment well Patient left: in chair;with chair alarm set;with call bell/phone within reach Nurse Communication: Mobility status PT Visit Diagnosis: Difficulty in walking, not elsewhere classified (R26.2);Muscle weakness (generalized) (M62.81);Other abnormalities of gait and mobility (R26.89);Unsteadiness on feet (R26.81)     Time: 9604-5409 PT Time Calculation (min) (ACUTE ONLY): 10 min  Charges:  $Therapeutic Activity: 8-22 mins                     Johny Shock, PTA Acute Rehabilitation Services Secure Chat Preferred  [phone]    Johny Shock 08/11/2022, 8:52 AM

## 2022-08-11 NOTE — Discharge Summary (Signed)
Physician Discharge Summary  Brandtly Doiron Dass EAV:[id number] DOB: [birth year] DOA: 08/03/2022  PCP: Jerl Mina, MD  Admit date: 08/03/2022 Discharge date: 08/11/2022  Admitted From: Home Discharge disposition: SNF  Brief narrative: James Sanford is a 67 y.o. male with PMH significant for prediabetes, HTN, HLD, OSA, A-fib on Eliquis., chronic alcoholism, h/o GI bleeding, chronic anemia, pancreatic insufficiency. 4/30, patient presented to the ED at [facility] after a fall and a leg injury. Per history, patient slipped and fell while walking up the stairs left leg followed by immediate significant pain. He denies hitting his head nor loss of consciousness.   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  Seen by orthopedics.  Given complex peri prosthetic fracture, patient was transferred to [facility] for surgical management. Seen by orthopedics Dr. Jena Gauss 5/1, underwent ORIF of left distal femur fracture  Subjective: Patient was seen and examined this morning.  Sitting up in recliner.  Not in distress.  Ready for SNF today.  Assessment and plan: Periprosthetic left femur fracture S/p ORIF - 5/1 Dr. Jena Gauss Secondary to mechanical fall  Imaging as above. Procedure as above. Continue pain management with Tylenol, as needed opiate On long-term anticoagulation with Eliquis Seen by PT. recommended SNF.  Chronic anemia H/o GI bleeding It seems her baseline hemoglobin hovers around 10 Postop, hemoglobin level was monitored.  Slight drop noted because of long bone fracture and surgery.  Remained stable between 8 and 9 for the last 4 days. Recent Labs    08/04/22 0738 08/05/22 0510 08/06/22 0503 08/08/22 0208 08/10/22 1054  HGB 9.3* 8.5* 8.2* 7.9* 8.4*  MCV  88.8 89.1 88.7 87.3 86.2   A-fib Not on any AV nodal blocking agent due to history of bradycardia Chronically on Eliquis.  Continue same  H/o HTN, HLD, prediabetes Not currently on any medication for these issues.    Chronic alcoholism Fatty liver SIBO No mention of cirrhosis in previous imaging from September 2023. Per my conversation with patient's wife, patient sees GI Dr. Mia Creek at [facility].  He has chronic diarrhea and has had extensive investigation.  Suspicion is SIBO.  Recently he was prescribed 2 weeks course of Xifaxan.  Patient ended up coming to the hospital with fracture prior to initiation of that medicine.  His wife has obtained the medicine and she would like this to be administered while at the rehab.  I am okay with the plan.  If any concern, SNF can reach out to patient's GI doctor Dr. Mia Creek.  Pancreatic insufficiency Seen recently for this with associated loose stool. Continue home Creon    OSA  Impaired mobility PT eval obtained.  SNF was recommended.   Goals of care   Code Status: Full Code   Wounds:  - Incision (Closed) 08/04/22 Leg Left (Active)  Date First Assessed/Time First Assessed: 08/04/22 1124   Location: Leg  Location Orientation: Left    Assessments 08/04/2022 12:10 PM 08/11/2022  8:30 AM  Dressing Type Compression wrap;Moisture barrier;Petroleum Liquid skin adhesive  Dressing Clean, Dry, Intact Clean, Dry, Intact  Site / Wound Assessment -- Clean;Dry  Margins -- Attached edges (approximated)  Closure -- Skin glue  Drainage Amount -- None     No associated orders.    Discharge Exam:   Vitals:   08/10/22 1402 08/10/22 2030 08/11/22  0503 08/11/22 0732  BP: (!) 92/59 93/64 100/62 105/71  Pulse: 87  69 68  Resp: 17 15 17 17   Temp: 99.1 F (37.3 C) 98 F (36.7 C) 98 F (36.7 C) 98.3 F (36.8 C)  TempSrc: Oral   Oral  SpO2: 96% 97% 98% 98%  Weight:      Height:        Body mass index is 21.7 kg/m.   General exam: Pleasant,  not in distress.  No new symptoms Skin: No rashes, lesions or ulcers. HEENT: Atraumatic, normocephalic, no obvious bleeding Lungs: Clear to auscultation bilaterally CVS: Regular rate and rhythm, no murmur GI/Abd soft, nontender, nondistended, bowel sound present CNS: Alert, awake, oriented x 3 Psychiatry: Mood appropriate Extremities: No pedal edema, no calf tenderness  Follow ups:    Follow-up Information     Haddix, Gillie Manners, MD. Schedule an appointment as soon as possible for a visit in 2 week(s).   Specialty: Orthopedic Surgery Why: for wound check and repeat x-rays left leg Contact information: [address] [place] [address] [phone]                 Discharge Instructions:   Discharge Instructions     Call MD for:  difficulty breathing, headache or visual disturbances   Complete by: As directed    Call MD for:  extreme fatigue   Complete by: As directed    Call MD for:  hives   Complete by: As directed    Call MD for:  persistant dizziness or light-headedness   Complete by: As directed    Call MD for:  persistant nausea and vomiting   Complete by: As directed    Call MD for:  severe uncontrolled pain   Complete by: As directed    Call MD for:  temperature >100.4   Complete by: As directed    Diet general   Complete by: As directed    Discharge instructions   Complete by: As directed    General discharge instructions: Follow with Primary MD Jerl Mina, MD in 7 days  Please request your PCP  to go over your hospital tests, procedures, radiology results at the follow up. Please get your medicines reviewed and adjusted.  Your PCP may decide to repeat certain labs or tests as needed. Do not drive, operate heavy machinery, perform activities at heights, swimming or participation in water activities or provide baby sitting services if your were admitted for syncope or siezures until you have seen by Primary MD or a Neurologist and advised to do so  again. [place] Controlled Substance Reporting System database was reviewed. Do not drive, operate heavy machinery, perform activities at heights, swim, participate in water activities or provide baby-sitting services while on medications for pain, sleep and mood until your outpatient physician has reevaluated you and advised to do so again.  You are strongly recommended to comply with the dose, frequency and duration of prescribed medications. Activity: As tolerated with Full fall precautions use walker/cane & assistance as needed Avoid using any recreational substances like cigarette, tobacco, alcohol, or non-prescribed drug. If you experience worsening of your admission symptoms, develop shortness of breath, life threatening emergency, suicidal or homicidal thoughts you must seek medical attention immediately by calling 911 or calling your MD immediately  if symptoms less severe. You must read complete instructions/literature along with all the possible adverse reactions/side effects for all the medicines you take and that have been prescribed to you. Take  any new medicine only after you have completely understood and accepted all the possible adverse reactions/side effects.  Wear Seat belts while driving. You were cared for by a hospitalist during your hospital stay. If you have any questions about your discharge medications or the care you received while you were in the hospital after you are discharged, you can call the unit and ask to speak with the hospitalist or the covering physician. Once you are discharged, your primary care physician will handle any further medical issues. Please note that NO REFILLS for any discharge medications will be authorized once you are discharged, as it is imperative that you return to your primary care physician (or establish a relationship with a primary care physician if you do not have one).   Increase activity slowly   Complete by: As directed         Discharge Medications:   Allergies as of 08/11/2022       Reactions   Penicillins Diarrhea   TOLERATED CEFAZOLIN 11/25/20 Unknown childhood reaction        Medication List     TAKE these medications    Acular 0.5 % ophthalmic solution Generic drug: ketorolac Place 1 drop into the right eye 4 (four) times daily.   Caltrate 600+D3 Soft 600-20 MG-MCG Chew Generic drug: Calcium Carb-Cholecalciferol Chew 1 tablet by mouth daily.   Creon 24000-76000 units Cpep Generic drug: Pancrelipase (Lip-Prot-Amyl) Take 2 capsules by mouth 4 (four) times daily.   Vitamin D 50 MCG (2000 UT) Caps Take 1 capsule by mouth daily.   D3-1000 25 MCG (1000 UT) capsule Generic drug: Cholecalciferol Take 1,000 Units by mouth every morning.   docusate sodium 100 MG capsule Commonly known as: COLACE Take 1 capsule (100 mg total) by mouth 2 (two) times daily.   Eliquis 5 MG Tabs tablet Generic drug: apixaban Take 5 mg by mouth 2 (two) times daily.   HYDROcodone-acetaminophen 5-325 MG tablet Commonly known as: NORCO/VICODIN Take 1-2 tablets by mouth every 6 (six) hours as needed for moderate pain or severe pain (1 tablet pain score 4-6, 2 tablets pain score 7-10).   Ilevro 0.3 % ophthalmic suspension Generic drug: nepafenac Place 1 drop into the right eye daily.   multivitamin with minerals Tabs tablet Take 1 tablet by mouth daily.   omega-3 acid ethyl esters 1 g capsule Commonly known as: LOVAZA Take 1 g by mouth daily.   polyethylene glycol 17 g packet Commonly known as: MIRALAX / GLYCOLAX Take 17 g by mouth daily as needed for mild constipation.   prednisoLONE acetate 1 % ophthalmic suspension Commonly known as: PRED FORTE Place 1 drop into the right eye 4 (four) times daily.   rifaximin 550 MG Tabs tablet Commonly known as: XIFAXAN Take 550 mg by mouth 3 (three) times daily.   Vigamox 0.5 % ophthalmic solution Generic drug: moxifloxacin Place 1 drop into the right eye 4  (four) times daily.   Vitamin D (Ergocalciferol) 1.25 MG (50000 UNIT) Caps capsule Commonly known as: DRISDOL Take 1 capsule (50,000 Units total) by mouth every 7 (seven) days. Start taking on: Aug 12, 2022         The results of significant diagnostics from this hospitalization (including imaging, microbiology, ancillary and laboratory) are listed below for reference.    Procedures and Diagnostic Studies:   DG FEMUR PORT MIN 2 VIEWS LEFT  Result Date: 08/04/2022 CLINICAL DATA:  Postop femur fracture EXAM: LEFT FEMUR PORTABLE 2 VIEWS COMPARISON:  Earlier same  day FINDINGS: Preexistent Ng proximal femoral gamma nail as seen on prior studies. Newly placed lateral femoral plate with screw fixation. Restoration of near anatomic alignment. No complicating feature. 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 IMPRESSION: Good appearance following lateral femoral plate and screw fixation with restoration of near anatomic alignment. Electronically Signed   By: Paulina Fusi M.D.   On: 08/04/2022 12:53   DG FEMUR MIN 2 VIEWS LEFT  Result Date: 08/04/2022 CLINICAL DATA:  ORIF of left femur fracture. EXAM: LEFT FEMUR 2 VIEWS COMPARISON:  Femur radiographs 08/03/2022 FINDINGS: Eight C-arm fluoroscopic images were obtained intraoperatively and submitted for post operative interpretation. The intraoperative images demonstrate sideplate and screw fixation of the comminuted distal femoral fracture with improved alignment. There is no evidence of immediate complication. Fluoro time 2 minutes 4 seconds, dose 3.44 mGy. Please see the performing provider's procedural report for further detail. IMPRESSION: Intraoperative images during sideplate and screw fixation of the comminuted distal femoral fracture with improved alignment. Electronically Signed   By: Lesia Hausen M.D.   On: 08/04/2022 12:10   DG C-Arm 1-60 Min-No Report  Result Date: 08/04/2022 Fluoroscopy was utilized by the requesting physician.  No  radiographic interpretation.   DG C-Arm 1-60 Min-No Report  Result Date: 08/04/2022 Fluoroscopy was utilized by the requesting physician.  No radiographic interpretation.   DG Knee Complete 4 Views Left  Result Date: 08/03/2022 CLINICAL DATA:  Recent fall with left knee pain, initial encounter EXAM: LEFT KNEE - COMPLETE 4+ VIEW COMPARISON:  None Available. FINDINGS: Left knee prosthesis is noted. Comminuted distal femoral fracture is noted at the diaphyseal metaphyseal junction. Mild impaction at the fracture site is seen. Joint effusion is noted. Mild posterior angulation of the distal fracture fragments is seen. IMPRESSION: Distal left femoral fracture with associated joint effusion. Electronically Signed   By: Alcide Clever M.D.   On: 08/03/2022 00:48   DG FEMUR MIN 2 VIEWS LEFT  Result Date: 08/03/2022 CLINICAL DATA:  Recent fall with left leg pain, initial encounter EXAM: LEFT FEMUR 2 VIEWS COMPARISON:  12/17/2020 FINDINGS: Proximal left medullary rod is noted with fixation screw traversing the femoral neck. Healed fracture is noted. In the distal femur however there is a comminuted fracture involving the diaphyseal metaphyseal junction with some impaction at the fracture site. Left knee replacement is noted. IMPRESSION: Comminuted distal femoral fracture as described. Electronically Signed   By: Alcide Clever M.D.   On: 08/03/2022 00:47     Labs:   Basic Metabolic Panel: Recent Labs  Lab 08/05/22 0510 08/08/22 0208  NA 140 137  K 4.4 4.4  CL 110 102  CO2 26 29  GLUCOSE 165* 102*  BUN 23 20  CREATININE 0.72 0.52*  CALCIUM 8.4* 7.9*   GFR Estimated Creatinine Clearance: 94.5 mL/min (A) (by C-G formula based on SCr of 0.52 mg/dL (L)). Liver Function Tests: No results for input(s): "AST", "ALT", "ALKPHOS", "BILITOT", "PROT", "ALBUMIN" in the last 168 hours. No results for input(s): "LIPASE", "AMYLASE" in the last 168 hours. No results for input(s): "AMMONIA" in the last 168  hours. Coagulation profile No results for input(s): "INR", "PROTIME" in the last 168 hours.  CBC: Recent Labs  Lab 08/05/22 0510 08/06/22 0503 08/08/22 0208 08/10/22 1054  WBC 8.7 6.7 5.1 8.1  NEUTROABS  --   --  3.2  --   HGB 8.5* 8.2* 7.9* 8.4*  HCT 27.1* 25.9* 25.4* 26.8*  MCV 89.1 88.7 87.3 86.2  PLT 126* 144* 190 291  Cardiac Enzymes: No results for input(s): "CKTOTAL", "CKMB", "CKMBINDEX", "TROPONINI" in the last 168 hours. BNP: Invalid input(s): "POCBNP" CBG: No results for input(s): "GLUCAP" in the last 168 hours. D-Dimer No results for input(s): "DDIMER" in the last 72 hours. Hgb A1c No results for input(s): "HGBA1C" in the last 72 hours. Lipid Profile No results for input(s): "CHOL", "HDL", "LDLCALC", "TRIG", "CHOLHDL", "LDLDIRECT" in the last 72 hours. Thyroid function studies No results for input(s): "TSH", "T4TOTAL", "T3FREE", "THYROIDAB" in the last 72 hours.  Invalid input(s): "FREET3" Anemia work up No results for input(s): "VITAMINB12", "FOLATE", "FERRITIN", "TIBC", "IRON", "RETICCTPCT" in the last 72 hours. Microbiology Recent Results (from the past 240 hour(s))  Surgical pcr screen     Status: None   Collection Time: 08/04/22 11:31 AM   Specimen: Nasal Mucosa; Nasal Swab  Result Value Ref Range Status   MRSA, PCR NEGATIVE NEGATIVE Final   Staphylococcus aureus NEGATIVE NEGATIVE Final    Comment: (NOTE) The Xpert SA Assay (FDA approved for NASAL specimens in patients 38 years of age and older), is one component of a comprehensive surveillance program. It is not intended to diagnose infection nor to guide or monitor treatment. Performed at [facility], [address], [place], [place] [address]     Time coordinating discharge: 45 minutes  Signed: Yurem Viner  [facility] 08/11/2022, 11:42 AM

## 2023-01-18 ENCOUNTER — Inpatient Hospital Stay
Admission: EM | Admit: 2023-01-18 | Discharge: 2023-01-21 | DRG: 377 | Disposition: A | Discharge status: Skilled Nursing Facility | Payer: Medicare PPO | Source: Skilled Nursing Facility | Attending: Internal Medicine | Admitting: Internal Medicine

## 2023-01-18 ENCOUNTER — Other Ambulatory Visit: Payer: Self-pay

## 2023-01-18 ENCOUNTER — Emergency Department: Payer: Medicare PPO

## 2023-01-18 DIAGNOSIS — K625 Hemorrhage of anus and rectum: Secondary | ICD-10-CM | POA: Diagnosis present

## 2023-01-18 DIAGNOSIS — E43 Unspecified severe protein-calorie malnutrition: Secondary | ICD-10-CM | POA: Diagnosis present

## 2023-01-18 DIAGNOSIS — Z9841 Cataract extraction status, right eye: Secondary | ICD-10-CM

## 2023-01-18 DIAGNOSIS — G4733 Obstructive sleep apnea (adult) (pediatric): Secondary | ICD-10-CM | POA: Diagnosis present

## 2023-01-18 DIAGNOSIS — K641 Second degree hemorrhoids: Secondary | ICD-10-CM | POA: Diagnosis present

## 2023-01-18 DIAGNOSIS — I429 Cardiomyopathy, unspecified: Secondary | ICD-10-CM | POA: Diagnosis present

## 2023-01-18 DIAGNOSIS — D62 Acute posthemorrhagic anemia: Secondary | ICD-10-CM | POA: Diagnosis present

## 2023-01-18 DIAGNOSIS — D500 Iron deficiency anemia secondary to blood loss (chronic): Secondary | ICD-10-CM

## 2023-01-18 DIAGNOSIS — K921 Melena: Secondary | ICD-10-CM | POA: Diagnosis present

## 2023-01-18 DIAGNOSIS — Z961 Presence of intraocular lens: Secondary | ICD-10-CM | POA: Diagnosis present

## 2023-01-18 DIAGNOSIS — I482 Chronic atrial fibrillation, unspecified: Secondary | ICD-10-CM | POA: Diagnosis not present

## 2023-01-18 DIAGNOSIS — Z6820 Body mass index (BMI) 20.0-20.9, adult: Secondary | ICD-10-CM | POA: Diagnosis not present

## 2023-01-18 DIAGNOSIS — E785 Hyperlipidemia, unspecified: Secondary | ICD-10-CM | POA: Diagnosis present

## 2023-01-18 DIAGNOSIS — Z5309 Procedure and treatment not carried out because of other contraindication: Secondary | ICD-10-CM | POA: Diagnosis not present

## 2023-01-18 DIAGNOSIS — K5730 Diverticulosis of large intestine without perforation or abscess without bleeding: Secondary | ICD-10-CM | POA: Diagnosis present

## 2023-01-18 DIAGNOSIS — K922 Gastrointestinal hemorrhage, unspecified: Secondary | ICD-10-CM | POA: Diagnosis not present

## 2023-01-18 DIAGNOSIS — Z7901 Long term (current) use of anticoagulants: Secondary | ICD-10-CM

## 2023-01-18 DIAGNOSIS — E119 Type 2 diabetes mellitus without complications: Secondary | ICD-10-CM | POA: Diagnosis present

## 2023-01-18 DIAGNOSIS — K8689 Other specified diseases of pancreas: Secondary | ICD-10-CM

## 2023-01-18 DIAGNOSIS — F1014 Alcohol abuse with alcohol-induced mood disorder: Secondary | ICD-10-CM | POA: Diagnosis present

## 2023-01-18 DIAGNOSIS — Z9884 Bariatric surgery status: Secondary | ICD-10-CM | POA: Diagnosis not present

## 2023-01-18 DIAGNOSIS — Z880 Allergy status to penicillin: Secondary | ICD-10-CM

## 2023-01-18 DIAGNOSIS — I480 Paroxysmal atrial fibrillation: Secondary | ICD-10-CM | POA: Diagnosis present

## 2023-01-18 DIAGNOSIS — S7290XA Unspecified fracture of unspecified femur, initial encounter for closed fracture: Secondary | ICD-10-CM | POA: Diagnosis present

## 2023-01-18 DIAGNOSIS — F0390 Unspecified dementia without behavioral disturbance: Secondary | ICD-10-CM | POA: Diagnosis present

## 2023-01-18 DIAGNOSIS — I10 Essential (primary) hypertension: Secondary | ICD-10-CM | POA: Diagnosis present

## 2023-01-18 LAB — BASIC METABOLIC PANEL
Anion gap: 8 (ref 5–15)
BUN: 32 mg/dL — ABNORMAL HIGH (ref 8–23)
CO2: 22 mmol/L (ref 22–32)
Calcium: 8.4 mg/dL — ABNORMAL LOW (ref 8.9–10.3)
Chloride: 113 mmol/L — ABNORMAL HIGH (ref 98–111)
Creatinine, Ser: 0.7 mg/dL (ref 0.61–1.24)
GFR, Estimated: >60 mL/min (ref >60)
Glucose, Bld: 103 mg/dL — ABNORMAL HIGH (ref 70–99)
Potassium: 4 mmol/L (ref 3.5–5.1)
Sodium: 143 mmol/L (ref 135–145)

## 2023-01-18 LAB — HEMOGLOBIN AND HEMATOCRIT, BLOOD
HCT: 16.6 % — ABNORMAL LOW (ref 39.0–52.0)
Hemoglobin: 5.2 g/dL — ABNORMAL LOW (ref 13.0–17.0)

## 2023-01-18 LAB — CBC
HCT: 17 % — ABNORMAL LOW (ref 39.0–52.0)
Hemoglobin: 5 g/dL — ABNORMAL LOW (ref 13.0–17.0)
MCH: 26.5 pg (ref 26.0–34.0)
MCHC: 29.4 g/dL — ABNORMAL LOW (ref 30.0–36.0)
MCV: 89.9 fL (ref 80.0–100.0)
Platelets: 156 10*3/uL (ref 150–400)
RBC: 1.89 MIL/uL — ABNORMAL LOW (ref 4.22–5.81)
RDW: 18.7 % — ABNORMAL HIGH (ref 11.5–15.5)
WBC: 5.4 10*3/uL (ref 4.0–10.5)
nRBC: 0 % (ref 0.0–0.2)

## 2023-01-18 LAB — PROTIME-INR
INR: 2.1 — ABNORMAL HIGH (ref 0.8–1.2)
Prothrombin Time: 23.6 s — ABNORMAL HIGH (ref 11.4–15.2)

## 2023-01-18 LAB — PREPARE RBC (CROSSMATCH)

## 2023-01-18 MED ORDER — BOOST / RESOURCE BREEZE PO LIQD CUSTOM
1.0000 | Freq: Three times a day (TID) | ORAL | Status: DC
  Administered 2023-01-19 – 2023-01-21 (×2): 1 via ORAL

## 2023-01-18 MED ORDER — SODIUM CHLORIDE 0.9 % IV SOLN: INTRAVENOUS | Status: DC

## 2023-01-18 MED ORDER — SODIUM CHLORIDE 0.9 % IV SOLN
10.0000 mL/h | Freq: Once | INTRAVENOUS | Status: AC
  Administered 2023-01-18: 10 mL/h via INTRAVENOUS

## 2023-01-18 MED ORDER — PANTOPRAZOLE SODIUM 40 MG IV SOLR
40.0000 mg | Freq: Two times a day (BID) | INTRAVENOUS | Status: DC
  Administered 2023-01-19 – 2023-01-21 (×5): 40 mg via INTRAVENOUS
  Filled 2023-01-18 (×5): qty 10

## 2023-01-18 MED ORDER — ONDANSETRON HCL 4 MG PO TABS: 4.0000 mg | ORAL_TABLET | Freq: Four times a day (QID) | ORAL | Status: DC | PRN

## 2023-01-18 MED ORDER — IOHEXOL 350 MG/ML SOLN
100.0000 mL | Freq: Once | INTRAVENOUS | Status: AC | PRN
  Administered 2023-01-18: 100 mL via INTRAVENOUS

## 2023-01-18 MED ORDER — LACTATED RINGERS IV BOLUS
1000.0000 mL | Freq: Once | INTRAVENOUS | Status: AC
  Administered 2023-01-18: 1000 mL via INTRAVENOUS

## 2023-01-18 MED ORDER — ONDANSETRON HCL 4 MG/2ML IJ SOLN: 4.0000 mg | Freq: Four times a day (QID) | INTRAMUSCULAR | Status: DC | PRN

## 2023-01-18 NOTE — Assessment & Plan Note (Signed)
Hemoglobin 5 in the setting of active GI bleeding Pending PRBC transfusion Will check serial hemoglobins Transfuse to hemoglobin less than 7 Monitor closely

## 2023-01-18 NOTE — Assessment & Plan Note (Signed)
Patient denies any active alcohol use at present

## 2023-01-18 NOTE — Assessment & Plan Note (Signed)
Blood sugars in low 100s.  Hold SSI  For now w/ active GIB Follow

## 2023-01-18 NOTE — ED Provider Notes (Signed)
[facility] Provider Note    Event Date/Time   First MD Initiated Contact with Patient 01/18/23 1301     (approximate)  History   Chief Complaint: Rectal Bleeding  HPI  Kelijah Towry is a 67 y.o. male with a past medical history of atrial fibrillation on Eliquis, hypertension, hyperlipidemia, memory deficits, presents from his nursing facility for rectal bleeding and weakness.  According to report patient was found to be weak today with significant amount of bright red blood per rectum.  Patient denies any abdominal pain denies any shortness of breath denies any nausea or vomiting.  Physical Exam   Triage Vital Signs: ED Triage Vitals  Encounter Vitals Group     BP 01/18/23 1251 (!) 92/56     Systolic BP Percentile --      Diastolic BP Percentile --      Pulse Rate 01/18/23 1252 92     Resp 01/18/23 1251 20     Temp 01/18/23 1251 (!) 97.5 F (36.4 C)     Temp src --      SpO2 01/18/23 1253 100 %     Weight 01/18/23 1252 151 lb (68.5 kg)     Height 01/18/23 1252 6' (1.829 m)     Head Circumference --      Peak Flow --      Pain Score 01/18/23 1251 0     Pain Loc --      Pain Education --      Exclude from Growth Chart --     Most recent vital signs: Vitals:   01/18/23 1252 01/18/23 1253  BP:    Pulse: 92   Resp:    Temp:    SpO2:  100%    General: Awake, no distress.  Patient is pale in appearance CV:  Good peripheral perfusion.  Regular rate and rhythm  Resp:  Normal effort.  Equal breath sounds bilaterally.  Abd:  No distention.  Soft, nontender.  No rebound or guarding.  Benign abdomen.  ED Results / Procedures / Treatments    MEDICATIONS ORDERED IN ED: Medications  lactated ringers bolus 1,000 mL (1,000 mLs Intravenous New Bag/Given 01/18/23 1324)  iohexol (OMNIPAQUE) 350 MG/ML injection 100 mL (has no administration in time range)  0.9 %  sodium chloride infusion (has no administration in time range)      IMPRESSION / MDM / ASSESSMENT AND PLAN / ED COURSE  I reviewed the triage vital signs and the nursing notes.  Patient's presentation is most consistent with acute presentation with potential threat to life or bodily function.  Patient presents emergency department for lower GI bleed bright red blood per rectum.  Patient has benign abdominal exam.  Rectal examination shows hematochezia strongly guaiac positive.  Patient's labs have resulted showing hemoglobin of 5.0 down from a baseline around 8.5.  Chemistry shows no significant findings INR of 2.1.  I have ordered 2 units of packed red blood cells I have verbally consented the patient and family member they are agreeable.  I have ordered a CTA GI bleed protocol as this may help treatment.  CRITICAL CARE Performed by: Minna Antis   Total critical care time: 30 minutes  Critical care time was exclusive of separately billable procedures and treating other patients.  Critical care was necessary to treat or prevent imminent or life-threatening deterioration.  Critical care was time spent personally by me on the following activities: development of treatment plan with patient and/or surrogate as  well as nursing, discussions with consultants, evaluation of patient's response to treatment, examination of patient, obtaining history from patient or surrogate, ordering and performing treatments and interventions, ordering and review of laboratory studies, ordering and review of radiographic studies, pulse oximetry and re-evaluation of patient's condition.   FINAL CLINICAL IMPRESSION(S) / ED DIAGNOSES   Symptomatic anemia GI bleed    Note:  This document was prepared using Dragon voice recognition software and may include unintentional dictation errors.   Minna Antis, MD 01/18/23 [phone]

## 2023-01-18 NOTE — ED Triage Notes (Signed)
Pt to ED from [facility] P[facility] for rectal bleeding started yesterday. Reports woke up with blood in bed sheet.s

## 2023-01-18 NOTE — Consult Note (Signed)
Consultation  Referring Provider:  Hospitalist    Admit date: 01/18/2023 Consult date:   01/18/2023       Reason for Consultation: Rectal bleeding             HPI:   James Sanford is a 67 y.o. gentleman with history of hypertension, a. Fib on eliquis, and OSA here for two day rectal bleeding. He had a colonoscopy in 12/2021 that was incomplete due to looping but he had extensive diverticulosis. Patient states that last bloody bowel movement was two days ago but wife thinks maybe this morning. CTA is negative. Patient states that he will not undergo colonoscopy.   Past Medical History:  Diagnosis Date   A-fib (HCC)    Anemia    Arthritis    Hyperlipemia    Hypertension    Memory deficit    Pre-diabetes    Sleep apnea     Past Surgical History:  Procedure Laterality Date   CATARACT EXTRACTION W/PHACO Right 03/09/2022   Procedure: CATARACT EXTRACTION PHACO AND INTRAOCULAR LENS PLACEMENT (IOC) RIGHT DIABETIC 8.98 01:09.1;  Surgeon: Estanislado Pandy, MD;  Location: [facility];  Service: Ophthalmology;  Laterality: Right;   COLONOSCOPY N/A 12/29/2021   Procedure: COLONOSCOPY;  Surgeon: Regis Bill, MD;  Location: [facility] ENDOSCOPY;  Service: Endoscopy;  Laterality: N/A;   COLONOSCOPY WITH PROPOFOL N/A 01/28/2020   Procedure: COLONOSCOPY WITH PROPOFOL;  Surgeon: Toney Reil, MD;  Location: [facility] ENDOSCOPY;  Service: Gastroenterology;  Laterality: N/A;   COLONOSCOPY WITH PROPOFOL N/A 01/28/2020   Procedure: COLONOSCOPY WITH PROPOFOL;  Surgeon: Toney Reil, MD;  Location: [facility] ENDOSCOPY;  Service: Gastroenterology;  Laterality: N/A;   ESOPHAGOGASTRODUODENOSCOPY N/A 12/29/2021   Procedure: ESOPHAGOGASTRODUODENOSCOPY (EGD);  Surgeon: Regis Bill, MD;  Location: [facility] ENDOSCOPY;  Service: Endoscopy;  Laterality: N/A;   ESOPHAGOGASTRODUODENOSCOPY (EGD) WITH PROPOFOL N/A 01/28/2020   Procedure: ESOPHAGOGASTRODUODENOSCOPY (EGD) WITH PROPOFOL;   Surgeon: Toney Reil, MD;  Location: [facility] ENDOSCOPY;  Service: Gastroenterology;  Laterality: N/A;   FLEXIBLE SIGMOIDOSCOPY N/A 04/06/2022   Procedure: FLEXIBLE SIGMOIDOSCOPY;  Surgeon: Regis Bill, MD;  Location: [facility] ENDOSCOPY;  Service: Endoscopy;  Laterality: N/A;   GASTRIC BYPASS     HERNIA REPAIR     INTRAMEDULLARY (IM) NAIL INTERTROCHANTERIC Left 11/25/2020   Procedure: INTRAMEDULLARY (IM) NAIL INTERTROCHANTRIC;  Surgeon: Lyndle Herrlich, MD;  Location: [facility] ORS;  Service: Orthopedics;  Laterality: Left;   JOINT REPLACEMENT Bilateral    Knee surgery   OPEN REDUCTION INTERNAL FIXATION (ORIF) DISTAL RADIAL FRACTURE Right 10/30/2017   Procedure: OPEN REDUCTION INTERNAL FIXATION (ORIF) DISTAL RADIAL FRACTURE;  Surgeon: Garnette Gunner, MD;  Location: [facility] ORS;  Service: Orthopedics;  Laterality: Right;   ORIF FEMUR FRACTURE Right 01/29/2020   Procedure: OPEN REDUCTION INTERNAL FIXATION (ORIF) DISTAL FEMUR FRACTURE;  Surgeon: Christena Flake, MD;  Location: [facility] ORS;  Service: Orthopedics;  Laterality: Right;   ORIF FEMUR FRACTURE Left 08/04/2022   Procedure: OPEN REDUCTION INTERNAL FIXATION (ORIF) DISTAL FEMUR FRACTURE;  Surgeon: Roby Lofts, MD;  Location: [facility] OR;  Service: Orthopedics;  Laterality: Left;    Family History  Problem Relation Age of Onset   Stroke Mother    Suicidality Father     Social History   Tobacco Use   Smoking status: Never   Smokeless tobacco: Former    Types: Chew   Tobacco comments:    quir about 4 years ago  Vaping Use   Vaping status: Never Used  Substance Use  Topics   Alcohol use: Not Currently    Comment: beer occasionally   Drug use: Not Currently    Prior to Admission medications   Medication Sig Start Date End Date Taking? Authorizing Provider  potassium chloride SA (KLOR-CON M) 20 MEQ tablet Take 20 mEq by mouth 2 (two) times daily. 11/30/22  Yes [provider]  XIFAXAN 550 MG TABS tablet Take 550 mg by mouth 3  (three) times daily. 08/11/22  Yes [provider]  ACULAR 0.5 % ophthalmic solution Place 1 drop into the right eye 4 (four) times daily. 06/01/22   [provider]  CALTRATE 600+D3 SOFT 600-20 MG-MCG CHEW Chew 1 tablet by mouth daily. 10/31/21   [provider]  Cholecalciferol (VITAMIN D) 50 MCG (2000 UT) CAPS Take 1 capsule by mouth daily.    [provider]  CREON 24000-76000 units CPEP Take 2 capsules by mouth 4 (four) times daily. 02/10/21   [provider]  D3-1000 25 MCG (1000 UT) capsule Take 1,000 Units by mouth every morning. 11/04/21   [provider]  docusate sodium (COLACE) 100 MG capsule Take 1 capsule (100 mg total) by mouth 2 (two) times daily. 08/11/22   Dahal, Melina Schools, MD  ELIQUIS 5 MG TABS tablet Take 5 mg by mouth 2 (two) times daily. 01/05/20   [provider]  HYDROcodone-acetaminophen (NORCO/VICODIN) 5-325 MG tablet Take 1-2 tablets by mouth every 6 (six) hours as needed for moderate pain or severe pain (1 tablet pain score 4-6, 2 tablets pain score 7-10). 08/06/22   [place] Bali, PA-C  ILEVRO 0.3 % ophthalmic suspension Place 1 drop into the right eye daily. 02/03/22   [provider]  Multiple Vitamin (MULTIVITAMIN WITH MINERALS) TABS tablet Take 1 tablet by mouth daily.    [provider]  omega-3 acid ethyl esters (LOVAZA) 1 g capsule Take 1 g by mouth daily.    [provider]  polyethylene glycol (MIRALAX / GLYCOLAX) 17 g packet Take 17 g by mouth daily as needed for mild constipation. 08/11/22   Lorin Glass, MD  prednisoLONE acetate (PRED FORTE) 1 % ophthalmic suspension Place 1 drop into the right eye 4 (four) times daily. 01/26/22   [provider]  VIGAMOX 0.5 % ophthalmic solution Place 1 drop into the right eye 4 (four) times daily. 02/02/22   [provider]  Vitamin D, Ergocalciferol, (DRISDOL) 1.25 MG (50000 UNIT) CAPS capsule Take 1 capsule (50,000 Units total) by  mouth every 7 (seven) days. 08/12/22   [place] Bali, PA-C    Current Facility-Administered Medications  Medication Dose Route Frequency Provider Last Rate Last Admin   pantoprazole (PROTONIX) injection 40 mg  40 mg Intravenous Q12H Floydene Flock, MD       Current Outpatient Medications  Medication Sig Dispense Refill   potassium chloride SA (KLOR-CON M) 20 MEQ tablet Take 20 mEq by mouth 2 (two) times daily.     XIFAXAN 550 MG TABS tablet Take 550 mg by mouth 3 (three) times daily.     ACULAR 0.5 % ophthalmic solution Place 1 drop into the right eye 4 (four) times daily.     CALTRATE 600+D3 SOFT 600-20 MG-MCG CHEW Chew 1 tablet by mouth daily.     Cholecalciferol (VITAMIN D) 50 MCG (2000 UT) CAPS Take 1 capsule by mouth daily.     CREON 24000-76000 units CPEP Take 2 capsules by mouth 4 (four) times daily.     D3-1000 25 MCG (  1000 UT) capsule Take 1,000 Units by mouth every morning.     docusate sodium (COLACE) 100 MG capsule Take 1 capsule (100 mg total) by mouth 2 (two) times daily. 10 capsule 0   ELIQUIS 5 MG TABS tablet Take 5 mg by mouth 2 (two) times daily.     HYDROcodone-acetaminophen (NORCO/VICODIN) 5-325 MG tablet Take 1-2 tablets by mouth every 6 (six) hours as needed for moderate pain or severe pain (1 tablet pain score 4-6, 2 tablets pain score 7-10). 30 tablet 0   ILEVRO 0.3 % ophthalmic suspension Place 1 drop into the right eye daily.     Multiple Vitamin (MULTIVITAMIN WITH MINERALS) TABS tablet Take 1 tablet by mouth daily.     omega-3 acid ethyl esters (LOVAZA) 1 g capsule Take 1 g by mouth daily.     polyethylene glycol (MIRALAX / GLYCOLAX) 17 g packet Take 17 g by mouth daily as needed for mild constipation. 14 each 0   prednisoLONE acetate (PRED FORTE) 1 % ophthalmic suspension Place 1 drop into the right eye 4 (four) times daily.     VIGAMOX 0.5 % ophthalmic solution Place 1 drop into the right eye 4 (four) times daily.     Vitamin D, Ergocalciferol, (DRISDOL) 1.25  MG (50000 UNIT) CAPS capsule Take 1 capsule (50,000 Units total) by mouth every 7 (seven) days. 8 capsule 0    Allergies as of 01/18/2023 - Review Complete 01/18/2023  Allergen Reaction Noted   Penicillins Diarrhea 01/25/2020     Review of Systems:    All systems reviewed and negative except where noted in HPI.  Review of Systems  Constitutional:  Negative for chills and fever.  Respiratory:  Negative for shortness of breath.   Cardiovascular:  Negative for chest pain.  Gastrointestinal:  Negative for abdominal pain, constipation, diarrhea, heartburn, nausea and vomiting.  Musculoskeletal:  Negative for joint pain.  Skin:  Negative for rash.  Neurological:  Negative for focal weakness.  Psychiatric/Behavioral:  Negative for substance abuse.   All other systems reviewed and are negative.      Physical Exam:  Vital signs in last 24 hours: Temp:  [97.5 F (36.4 C)-97.8 F (36.6 C)] 97.8 F (36.6 C) (10/15 1456) Pulse Rate:  [57-92] 57 (10/15 1456) Resp:  [18-21] 18 (10/15 1456) BP: (92-111)/(56-69) 111/69 (10/15 1456) SpO2:  [91 %-100 %] 91 % (10/15 1432) Weight:  [68.5 kg] 68.5 kg (10/15 1252)   General:  NAD Head:  Normocephalic and atraumatic. Eyes:   No icterus.   Conjunctiva pink. Ears:  Normal auditory acuity. Mouth: Mucosa pink moist, no lesions. Neck:  Supple; no masses felt Lungs:  No respiratory distress Abdomen:   Flat, soft, nondistended, nontender Msk:  Strength 5/5. Symmetrical without gross deformities. Neurologic:  Alert and  oriented x4;  No focal deficits Skin:  Warm, dry, pink without significant lesions or rashes. Psych:  Alert and cooperative. Normal affect.  LAB RESULTS: Recent Labs    01/18/23 1301  WBC 5.4  HGB 5.0*  HCT 17.0*  PLT 156   BMET Recent Labs    01/18/23 1301  NA 143  K 4.0  CL 113*  CO2 22  GLUCOSE 103*  BUN 32*  CREATININE 0.70  CALCIUM 8.4*   LFT No results for input(s): "PROT", "ALBUMIN", "AST", "ALT",  "ALKPHOS", "BILITOT", "BILIDIR", "IBILI" in the last 72 hours. PT/INR Recent Labs    01/18/23 1313  LABPROT 23.6*  INR 2.1*    STUDIES: No results found.  Impression / Plan:   67 y/o gentleman with history of hypertension, a. Fib on eliquis, and OSA here for two day rectal bleeding. Suspect diverticular. Would recommend colonoscopy at some point but patient has refused so far. Will readdress tomorrow.  - two large bore IV's - transfuse to keep hemoglobin > 7 - hold DOAC - consider PO vitamin k 5 mg times one - clear liquids - if recurrent GI bleeding, repeat CTA  Will continue to follow, please call with any questions or concerns.  Merlyn Lot MD, MPH [facility]

## 2023-01-18 NOTE — Assessment & Plan Note (Signed)
On Eliquis Hold in setting of active GI bleeding

## 2023-01-18 NOTE — Assessment & Plan Note (Signed)
Lower limit normal BP with no active GI bleeding Pending PRBC transfusion

## 2023-01-18 NOTE — Assessment & Plan Note (Signed)
Bright red blood per rectum x 2 days in setting of baseline Eliquis use Last Eliquis dose this morning Hemoglobin 5 Active PRBC transfusion Will hold Eliquis IV PPI Dr. Mia Creek with gastroenterology consulted Follow-up GI recommendations

## 2023-01-18 NOTE — Assessment & Plan Note (Signed)
2D echo January 2023 with EF of 50% Clinically dry in the setting of active GI bleeding Pending PRBC transfusion Monitor volume status Follow closely

## 2023-01-18 NOTE — H&P (Signed)
History and Physical    Patient: James Sanford MWU:[id number] DOB: 22-Nov-1955 DOA: 01/18/2023 DOS: the patient was seen and examined on 01/18/2023 PCP: Jerl Mina, MD  Patient coming from: Home  Chief Complaint:  Chief Complaint  Patient presents with   Rectal Bleeding   HPI: James Sanford is a 67 y.o. male with medical history significant of atrial fibrillation, hyperlipidemia, dementia presenting with hematochezia.  Patient and wife reports 2 days of bright red blood per rectum.  Currently at rehab facility after left leg injury.  On Eliquis in the setting atrial fibrillation.  Noted prior history of alcohol abuse.  Last drink was March of this year.  No reported abdominal pain vomiting or coffee-ground emesis.  No chest pain or shortness of breath.  Positive worsening weakness and fatigue.  Last Eliquis dose was this morning. Presented to the ER afebrile, blood pressures 90s to 110s over 50s to 60s.  Satting well on room air.  Notable labs include a hemoglobin of 5.  With baseline hemoglobin around 8-9.  Creatinine 0.7.  CT angio GI bleed pending.   Review of Systems: As mentioned in the history of present illness. All other systems reviewed and are negative. Past Medical History:  Diagnosis Date   A-fib (HCC)    Anemia    Arthritis    Hyperlipemia    Hypertension    Memory deficit    Pre-diabetes    Sleep apnea    Past Surgical History:  Procedure Laterality Date   CATARACT EXTRACTION W/PHACO Right 03/09/2022   Procedure: CATARACT EXTRACTION PHACO AND INTRAOCULAR LENS PLACEMENT (IOC) RIGHT DIABETIC 8.98 01:09.1;  Surgeon: Estanislado Pandy, MD;  Location: [facility];  Service: Ophthalmology;  Laterality: Right;   COLONOSCOPY N/A 12/29/2021   Procedure: COLONOSCOPY;  Surgeon: Regis Bill, MD;  Location: [facility] ENDOSCOPY;  Service: Endoscopy;  Laterality: N/A;   COLONOSCOPY WITH PROPOFOL N/A 01/28/2020   Procedure: COLONOSCOPY WITH  PROPOFOL;  Surgeon: Toney Reil, MD;  Location: [facility] ENDOSCOPY;  Service: Gastroenterology;  Laterality: N/A;   COLONOSCOPY WITH PROPOFOL N/A 01/28/2020   Procedure: COLONOSCOPY WITH PROPOFOL;  Surgeon: Toney Reil, MD;  Location: [facility] ENDOSCOPY;  Service: Gastroenterology;  Laterality: N/A;   ESOPHAGOGASTRODUODENOSCOPY N/A 12/29/2021   Procedure: ESOPHAGOGASTRODUODENOSCOPY (EGD);  Surgeon: Regis Bill, MD;  Location: [facility] ENDOSCOPY;  Service: Endoscopy;  Laterality: N/A;   ESOPHAGOGASTRODUODENOSCOPY (EGD) WITH PROPOFOL N/A 01/28/2020   Procedure: ESOPHAGOGASTRODUODENOSCOPY (EGD) WITH PROPOFOL;  Surgeon: Toney Reil, MD;  Location: [facility] ENDOSCOPY;  Service: Gastroenterology;  Laterality: N/A;   FLEXIBLE SIGMOIDOSCOPY N/A 04/06/2022   Procedure: FLEXIBLE SIGMOIDOSCOPY;  Surgeon: Regis Bill, MD;  Location: [facility] ENDOSCOPY;  Service: Endoscopy;  Laterality: N/A;   GASTRIC BYPASS     HERNIA REPAIR     INTRAMEDULLARY (IM) NAIL INTERTROCHANTERIC Left 11/25/2020   Procedure: INTRAMEDULLARY (IM) NAIL INTERTROCHANTRIC;  Surgeon: Lyndle Herrlich, MD;  Location: [facility] ORS;  Service: Orthopedics;  Laterality: Left;   JOINT REPLACEMENT Bilateral    Knee surgery   OPEN REDUCTION INTERNAL FIXATION (ORIF) DISTAL RADIAL FRACTURE Right 10/30/2017   Procedure: OPEN REDUCTION INTERNAL FIXATION (ORIF) DISTAL RADIAL FRACTURE;  Surgeon: Garnette Gunner, MD;  Location: [facility] ORS;  Service: Orthopedics;  Laterality: Right;   ORIF FEMUR FRACTURE Right 01/29/2020   Procedure: OPEN REDUCTION INTERNAL FIXATION (ORIF) DISTAL FEMUR FRACTURE;  Surgeon: Christena Flake, MD;  Location: [facility] ORS;  Service: Orthopedics;  Laterality: Right;   ORIF FEMUR FRACTURE Left 08/04/2022   Procedure:  OPEN REDUCTION INTERNAL FIXATION (ORIF) DISTAL FEMUR FRACTURE;  Surgeon: Roby Lofts, MD;  Location: [facility] OR;  Service: Orthopedics;  Laterality: Left;   Social History:  reports that he has never smoked. He has  quit using smokeless tobacco.  His smokeless tobacco use included chew. He reports that he does not currently use alcohol. He reports that he does not currently use drugs.  Allergies  Allergen Reactions   Penicillins Diarrhea    TOLERATED CEFAZOLIN 11/25/20 Unknown childhood reaction    Family History  Problem Relation Age of Onset   Stroke Mother    Suicidality Father     Prior to Admission medications   Medication Sig Start Date End Date Taking? Authorizing Provider  potassium chloride SA (KLOR-CON M) 20 MEQ tablet Take 20 mEq by mouth 2 (two) times daily. 11/30/22  Yes [provider]  XIFAXAN 550 MG TABS tablet Take 550 mg by mouth 3 (three) times daily. 08/11/22  Yes [provider]  ACULAR 0.5 % ophthalmic solution Place 1 drop into the right eye 4 (four) times daily. 06/01/22   [provider]  CALTRATE 600+D3 SOFT 600-20 MG-MCG CHEW Chew 1 tablet by mouth daily. 10/31/21   [provider]  Cholecalciferol (VITAMIN D) 50 MCG (2000 UT) CAPS Take 1 capsule by mouth daily.    [provider]  CREON 24000-76000 units CPEP Take 2 capsules by mouth 4 (four) times daily. 02/10/21   [provider]  D3-1000 25 MCG (1000 UT) capsule Take 1,000 Units by mouth every morning. 11/04/21   [provider]  docusate sodium (COLACE) 100 MG capsule Take 1 capsule (100 mg total) by mouth 2 (two) times daily. 08/11/22   Dahal, Melina Schools, MD  ELIQUIS 5 MG TABS tablet Take 5 mg by mouth 2 (two) times daily. 01/05/20   [provider]  HYDROcodone-acetaminophen (NORCO/VICODIN) 5-325 MG tablet Take 1-2 tablets by mouth every 6 (six) hours as needed for moderate pain or severe pain (1 tablet pain score 4-6, 2 tablets pain score 7-10). 08/06/22   [place] Bali, PA-C  ILEVRO 0.3 % ophthalmic suspension Place 1 drop into the right eye daily. 02/03/22   [provider]  Multiple Vitamin (MULTIVITAMIN WITH MINERALS) TABS tablet Take 1 tablet by  mouth daily.    [provider]  omega-3 acid ethyl esters (LOVAZA) 1 g capsule Take 1 g by mouth daily.    [provider]  polyethylene glycol (MIRALAX / GLYCOLAX) 17 g packet Take 17 g by mouth daily as needed for mild constipation. 08/11/22   Lorin Glass, MD  prednisoLONE acetate (PRED FORTE) 1 % ophthalmic suspension Place 1 drop into the right eye 4 (four) times daily. 01/26/22   [provider]  VIGAMOX 0.5 % ophthalmic solution Place 1 drop into the right eye 4 (four) times daily. 02/02/22   [provider]  Vitamin D, Ergocalciferol, (DRISDOL) 1.25 MG (50000 UNIT) CAPS capsule Take 1 capsule (50,000 Units total) by mouth every 7 (seven) days. 08/12/22   [place] Bali, PA-C    Physical Exam: Vitals:   01/18/23 1252 01/18/23 1253 01/18/23 1432 01/18/23 1456  BP:   106/67 111/69  Pulse: 92  60 (!) 57  Resp:   (!) 21 18  Temp:   97.7 F (36.5 C) 97.8 F (36.6 C)  TempSrc:   Oral Oral  SpO2:  100% 91%   Weight: 68.5 kg     Height: 6' (1.829 m)  Physical Exam Constitutional:      Appearance: He is normal weight.  HENT:     Head: Normocephalic and atraumatic.     Nose: Nose normal.     Mouth/Throat:     Mouth: Mucous membranes are moist.  Eyes:     Pupils: Pupils are equal, round, and reactive to light.  Cardiovascular:     Rate and Rhythm: Normal rate and regular rhythm.  Pulmonary:     Effort: Pulmonary effort is normal.  Abdominal:     General: Bowel sounds are normal.  Musculoskeletal:        General: Normal range of motion.  Skin:    Coloration: Skin is pale.  Neurological:     General: No focal deficit present.  Psychiatric:        Mood and Affect: Mood normal.     Data Reviewed:  There are no new results to review at this time.  DG FEMUR PORT MIN 2 VIEWS LEFT CLINICAL DATA:  Postop femur fracture  EXAM: LEFT FEMUR PORTABLE 2 VIEWS  COMPARISON:  Earlier same day  FINDINGS: Preexistent Ng proximal femoral  gamma nail as seen on prior studies. Newly placed lateral femoral plate with screw fixation. Restoration of near anatomic alignment. No complicating feature. 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  IMPRESSION: Good appearance following lateral femoral plate and screw fixation with restoration of near anatomic alignment.  Electronically Signed   By: Paulina Fusi M.D.   On: 08/04/2022 12:53 DG FEMUR MIN 2 VIEWS LEFT CLINICAL DATA:  ORIF of left femur fracture.  EXAM: LEFT FEMUR 2 VIEWS  COMPARISON:  Femur radiographs 08/03/2022  FINDINGS: Eight C-arm fluoroscopic images were obtained intraoperatively and submitted for post operative interpretation. The intraoperative images demonstrate sideplate and screw fixation of the comminuted distal femoral fracture with improved alignment. There is no evidence of immediate complication. Fluoro time 2 minutes 4 seconds, dose 3.44 mGy. Please see the performing provider's procedural report for further detail.  IMPRESSION: Intraoperative images during sideplate and screw fixation of the comminuted distal femoral fracture with improved alignment.  Electronically Signed   By: Lesia Hausen M.D.   On: 08/04/2022 12:10 DG C-Arm 1-60 Min-No Report Fluoroscopy was utilized by the requesting physician.  No radiographic  interpretation.  DG C-Arm 1-60 Min-No Report Fluoroscopy was utilized by the requesting physician.  No radiographic  interpretation.   Lab Results  Component Value Date   WBC 5.4 01/18/2023   HGB 5.0 (L) 01/18/2023   HCT 17.0 (L) 01/18/2023   MCV 89.9 01/18/2023   PLT 156 01/18/2023   Last metabolic panel Lab Results  Component Value Date   GLUCOSE 103 (H) 01/18/2023   NA 143 01/18/2023   K 4.0 01/18/2023   CL 113 (H) 01/18/2023   CO2 22 01/18/2023   BUN 32 (H) 01/18/2023   CREATININE 0.70 01/18/2023   GFRNONAA >60 01/18/2023   CALCIUM 8.4 (L) 01/18/2023   PHOS 3.7 12/07/2020   PROT 6.7 03/05/2022    ALBUMIN 3.5 03/05/2022   BILITOT 0.9 03/05/2022   ALKPHOS 106 03/05/2022   AST 29 03/05/2022   ALT 23 03/05/2022   ANIONGAP 8 01/18/2023    Assessment and Plan: * GIB (gastrointestinal bleeding) Bright red blood per rectum x 2 days in setting of baseline Eliquis use Last Eliquis dose this morning Hemoglobin 5 Active PRBC transfusion Will hold Eliquis IV PPI Dr. Mia Creek with gastroenterology consulted Follow-up GI recommendations  Alcohol abuse with alcohol-induced mood disorder [facility]) Patient denies  any active alcohol use at present   Femoral fracture [facility])    Essential hypertension Lower limit normal BP with no active GI bleeding Pending PRBC transfusion   AF (paroxysmal atrial fibrillation) (HCC) On Eliquis Hold in setting of active GI bleeding  Cardiomyopathy, idiopathic (HCC) 2D echo January 2023 with EF of 50% Clinically dry in the setting of active GI bleeding Pending PRBC transfusion Monitor volume status Follow closely  Diabetes mellitus type 2, uncomplicated (HCC) Blood sugars in low 100s.  Hold SSI  For now w/ active GIB Follow      Advance Care Planning:   Code Status: Prior   Consults: Gastroenterology   Family Communication: Wife at the bedside   Severity of Illness: The appropriate patient status for this patient is INPATIENT. Inpatient status is judged to be reasonable and necessary in order to provide the required intensity of service to ensure the patient's safety. The patient's presenting symptoms, physical exam findings, and initial radiographic and laboratory data in the context of their chronic comorbidities is felt to place them at high risk for further clinical deterioration. Furthermore, it is not anticipated that the patient will be medically stable for discharge from the hospital within 2 midnights of admission.   * I certify that at the point of admission it is my clinical judgment that the patient will require inpatient hospital  care spanning beyond 2 midnights from the point of admission due to high intensity of service, high risk for further deterioration and high frequency of surveillance required.*  Author: Floydene Flock, MD 01/18/2023 3:46 PM  For on call review [email or web address].

## 2023-01-19 DIAGNOSIS — D62 Acute posthemorrhagic anemia: Secondary | ICD-10-CM | POA: Diagnosis not present

## 2023-01-19 DIAGNOSIS — K922 Gastrointestinal hemorrhage, unspecified: Secondary | ICD-10-CM | POA: Diagnosis not present

## 2023-01-19 DIAGNOSIS — I429 Cardiomyopathy, unspecified: Secondary | ICD-10-CM | POA: Diagnosis not present

## 2023-01-19 DIAGNOSIS — I480 Paroxysmal atrial fibrillation: Secondary | ICD-10-CM

## 2023-01-19 LAB — PREPARE RBC (CROSSMATCH)

## 2023-01-19 LAB — HEMOGLOBIN AND HEMATOCRIT, BLOOD
HCT: 17.6 % — ABNORMAL LOW (ref 39.0–52.0)
HCT: 23.3 % — ABNORMAL LOW (ref 39.0–52.0)
Hemoglobin: 5.7 g/dL — ABNORMAL LOW (ref 13.0–17.0)
Hemoglobin: 7.5 g/dL — ABNORMAL LOW (ref 13.0–17.0)

## 2023-01-19 MED ORDER — SODIUM CHLORIDE 0.9% IV SOLUTION
Freq: Once | INTRAVENOUS | Status: AC
  Administered 2023-01-20: 300 mL via INTRAVENOUS

## 2023-01-19 MED ORDER — PEG 3350-KCL-NA BICARB-NACL 420 G PO SOLR
4000.0000 mL | Freq: Once | ORAL | Status: AC
  Administered 2023-01-19: 4000 mL via ORAL
  Filled 2023-01-19: qty 4000

## 2023-01-19 NOTE — Progress Notes (Signed)
PT Cancellation Note  Patient Details Name: James Sanford MRN: [id number] DOB: Apr 14, 1955   Cancelled Treatment:    Reason Eval/Treat Not Completed: Medical issues which prohibited therapy. Orders Received, Chart Reviewed. Low Hemoglobin prohibiting therapy at this time. Will reassess later date/time as medically stable.    Howie Ill, PT, DPT 01/19/23 8:46 AM

## 2023-01-19 NOTE — Progress Notes (Signed)
Initial Nutrition Assessment  DOCUMENTATION CODES:   Severe malnutrition in context of chronic illness  INTERVENTION:   -Boost Breeze po TID, each supplement provides 250 kcal and 9 grams of protein  -MVI with minerals daily -RD will follow for diet advancement and adjust supplement regimen as appropriate  NUTRITION DIAGNOSIS:   Severe Malnutrition related to chronic illness (dementia) as evidenced by moderate fat depletion, severe fat depletion, moderate muscle depletion, severe muscle depletion.  GOAL:   Patient will meet greater than or equal to 90% of their needs  MONITOR:   PO intake, Supplement acceptance, Diet advancement  REASON FOR ASSESSMENT:   Malnutrition Screening Tool    ASSESSMENT:   Pt with medical history significant of atrial fibrillation, hyperlipidemia, dementia presenting with hematochezia.  Pt admitted with GIB.   Reviewed I/O's: +1.6 L x 24 hours  UOP: 500 ml x 24 hours   Per GI notes, plan for colonoscopy tomorrow.   Spoke with pt at bedside, who was pleasant and in good spirits today. He reports good appetite, consumed all of his breakfast and is waiting on lunch.   PTA, pt reports he consumes 3 meals per day, but decreased oral intake. Pt shares that he has lived at [facility] for approximately one month PTA; he does not care for the food served there as they "use too many weird sauces".   Pt unsure of UBW. Pt reports he has lost "a few pounds" over the past month due to decreased oral intake. Reviewed wt hx; pt has experienced a 5.6% wt loss over the past 6 months.   Discussed importance of good meal and supplement intake to promote healing. Pt amenable to supplements.   Medications reviewed and include protonix.   Labs reviewed.    NUTRITION - FOCUSED PHYSICAL EXAM:  Flowsheet Row Most Recent Value  Orbital Region Moderate depletion  Upper Arm Region Severe depletion  Thoracic and Lumbar Region Severe depletion  Buccal  Region Moderate depletion  Temple Region Moderate depletion  Clavicle Bone Region Severe depletion  Clavicle and Acromion Bone Region Severe depletion  Scapular Bone Region Severe depletion  Dorsal Hand Moderate depletion  Patellar Region Moderate depletion  Anterior Thigh Region Moderate depletion  Posterior Calf Region Moderate depletion  Edema (RD Assessment) None  Hair Reviewed  Eyes Reviewed  Mouth Reviewed  Skin Reviewed  Nails Reviewed       Diet Order:   Diet Order             Diet clear liquid Room service appropriate? Yes; Fluid consistency: Thin  Diet effective now                   EDUCATION NEEDS:   Education needs have been addressed  Skin:  Skin Assessment: Reviewed RN Assessment  Last BM:  01/18/23  Height:   Ht Readings from Last 1 Encounters:  01/18/23 6' (1.829 m)    Weight:   Wt Readings from Last 1 Encounters:  01/18/23 68.5 kg    Ideal Body Weight:  80.9 kg  BMI:  Body mass index is 20.48 kg/m.  Estimated Nutritional Needs:   Kcal:  2050-2250  Protein:  105-120 grams  Fluid:  > 2 L    Levada Schilling, RD, LDN, CDCES Registered Dietitian III Certified Diabetes Care and Education Specialist Please refer to [facility] for RD and/or RD on-call/weekend/after hours pager

## 2023-01-19 NOTE — Progress Notes (Signed)
Physical Therapy Evaluation Patient Details Name: James Sanford MRN: [id number] DOB: [birth year] Today's Date: 01/19/2023  History of Present Illness  James Sanford is a 67 y.o. male with a past medical history of atrial fibrillation on Eliquis, hypertension, hyperlipidemia, memory deficits, presents from his nursing facility for rectal bleeding and weakness.   Clinical Impression  Patient supine in bed upon arrival, alert and oriented, agreeable to PT evaluation. Prior to admission, patient was residing in [place] and reports Mod I for ADLs and Mobility with use of Rollator. Patient BP 98/71 at start of session, with patient asymptomatic. Patient Mod I with bed mobility and transfers, increased time required. Patient able to ambulate ~400 ft with Rollator with Mild antalgic gait pattern but denies pain this date, close supervision for safety but no LOB noted. BP after ambulation: 107/74. Patient presents at functional baseline upon evaluation, no acute skilled PT needs at this time. Will discharge in house.       If plan is discharge home, recommend the following: Help with stairs or ramp for entrance   Can travel by private vehicle        Equipment Recommendations None recommended by PT  Recommendations for Other Services       Functional Status Assessment Patient has had a recent decline in their functional status and demonstrates the ability to make significant improvements in function in a reasonable and predictable amount of time.     Precautions / Restrictions Precautions Precautions: None Required Braces or Orthoses: Other Brace Other Brace: shoe lift on L shoe Restrictions Weight Bearing Restrictions: No      Mobility  Bed Mobility Overal bed mobility: Modified Independent             General bed mobility comments: Patient was Mod I for bed mobility; supine > sit, and sit > supine. Increased time required. BP seated EOB: 98/71, patient  asymptomatic    Transfers Overall transfer level: Modified independent Equipment used: Rollator (4 wheels)               General transfer comment: Patient Mod I to stand from EOB with use of Rollator; No LOB or postural sway noted upon standing. BP standing was 92/79. Pt asymptomatic.    Ambulation/Gait Ambulation/Gait assistance: Supervision Gait Distance (Feet): 400 Feet Assistive device: Rollator (4 wheels) Gait Pattern/deviations: WFL(Within Functional Limits)       General Gait Details: Pt able to ambulate ~ 400 ft with use of Rollator close supervision; L shoe lift donned for ambulation. No imbalance noted with use of rollator. mild antalgic gait pattern noted, but patient denies pain  Stairs            Wheelchair Mobility     Tilt Bed    Modified Rankin (Stroke Patients Only)       Balance Overall balance assessment: Modified Independent                                           Pertinent Vitals/Pain Pain Assessment Pain Assessment: No/denies pain    Home Living Family/patient expects to be discharged to:: Assisted living                 Home Equipment: Rollator (4 wheels);Shower seat - built in;Grab bars - tub/shower;Grab bars - toilet Additional Comments: Per patient reports staying at [facility] in Assisted Living, was recieving rehab  approx 2 days/week. Patient reports plan is for him to return to [facility].    Prior Function Prior Level of Function : Independent/Modified Independent             Mobility Comments: Mod I with use of Rollator; was ambulating to/from meals. ADLs Comments: Per patient reports was Mod I with ADLs; did not need assist from staff at [facility]     Extremity/Trunk Assessment   Upper Extremity Assessment Upper Extremity Assessment: Overall WFL for tasks assessed    Lower Extremity Assessment Lower Extremity Assessment: Overall WFL for tasks assessed       Communication    Communication Communication: No apparent difficulties  Cognition Arousal: Alert Behavior During Therapy: WFL for tasks assessed/performed Overall Cognitive Status: No family/caregiver present to determine baseline cognitive functioning                                          General Comments      Exercises     Assessment/Plan    PT Assessment All further PT needs can be met in the next venue of care  PT Problem List Decreased activity tolerance;Decreased mobility       PT Treatment Interventions      PT Goals (Current goals can be found in the Care Plan section)       Frequency       Co-evaluation               AM-PAC PT "6 Clicks" Mobility  Outcome Measure Help needed turning from your back to your side while in a flat bed without using bedrails?: None Help needed moving from lying on your back to sitting on the side of a flat bed without using bedrails?: None Help needed moving to and from a bed to a chair (including a wheelchair)?: None Help needed standing up from a chair using your arms (e.g., wheelchair or bedside chair)?: None Help needed to walk in hospital room?: A Little Help needed climbing 3-5 steps with a railing? : A Little 6 Click Score: 22    End of Session Equipment Utilized During Treatment: Gait belt Activity Tolerance: Patient tolerated treatment well Patient left: in bed;with call bell/phone within reach Nurse Communication: Mobility status PT Visit Diagnosis: Other abnormalities of gait and mobility (R26.89);History of falling (Z91.81)    Time: 1021-1040 PT Time Calculation (min) (ACUTE ONLY): 19 min   Charges:   PT Evaluation $PT Eval Low Complexity: 1 Low   PT General Charges $$ ACUTE PT VISIT: 1 Visit         [name], PT, DPT 01/19/23 11:05 AM

## 2023-01-19 NOTE — Progress Notes (Signed)
[facility]  - [place] at [facility]   PATIENT NAME: James Sanford    MR#:  [id number]  DATE OF BIRTH:  [birth year]  SUBJECTIVE:  Sitting up at the edge of the bed. Spoke with wife James Sanford No rectal bleed   VITALS:  Blood pressure 108/71, pulse 65, temperature (!) 97.5 F (36.4 C), resp. rate 16, height 6' (1.829 m), weight 68.5 kg, SpO2 100%.  PHYSICAL EXAMINATION:   GENERAL:  67 y.o.-year-old patient with no acute distress.  LUNGS: Normal breath sounds bilaterally CARDIOVASCULAR: S1, S2 normal.  ABDOMEN: Soft, nontender, nondistended. EXTREMITIES: No  edema b/l.    NEUROLOGIC: nonfocal  patient is alert and awake  LABORATORY PANEL:  CBC Recent Labs  Lab 01/18/23 1301 01/18/23 1858 01/19/23 0910  WBC 5.4  --   --   HGB 5.0*   < > 7.5*  HCT 17.0*   < > 23.3*  PLT 156  --   --    < > = values in this interval not displayed.    Chemistries  Recent Labs  Lab 01/18/23 1301  NA 143  K 4.0  CL 113*  CO2 22  GLUCOSE 103*  BUN 32*  CREATININE 0.70  CALCIUM 8.4*    RADIOLOGY:  CT ANGIO GI BLEED  Result Date: 01/18/2023 CLINICAL DATA:  Lower GI bleeding. EXAM: CTA ABDOMEN AND PELVIS WITHOUT AND WITH CONTRAST TECHNIQUE: Multidetector CT imaging of the abdomen and pelvis was performed using the standard protocol during bolus administration of intravenous contrast. Multiplanar reconstructed images and MIPs were obtained and reviewed to evaluate the vascular anatomy. RADIATION DOSE REDUCTION: This exam was performed according to the departmental dose-optimization program which includes automated exposure control, adjustment of the mA and/or kV according to patient size and/or use of iterative reconstruction technique. CONTRAST:  OMNIPAQUE IOHEXOL 350 MG/ML SOLN COMPARISON:  CTA abdomen and pelvis 01/25/2020. FINDINGS: VASCULAR Aorta: Normal caliber aorta without aneurysm, dissection, vasculitis or significant stenosis. There is moderate calcified  atherosclerotic disease throughout the aorta. Celiac: Patent without evidence of aneurysm, dissection, vasculitis or significant stenosis. SMA: Patent without evidence of aneurysm, dissection, vasculitis or significant stenosis. Renals: Both renal arteries are patent without evidence of aneurysm, dissection, vasculitis, fibromuscular dysplasia or significant stenosis. IMA: Patent without evidence of aneurysm, dissection, vasculitis or significant stenosis. Inflow: Patent without evidence of aneurysm, dissection, vasculitis or significant stenosis. Proximal Outflow: Bilateral common femoral and visualized portions of the superficial and profunda femoral arteries are patent without evidence of aneurysm, dissection, vasculitis or significant stenosis. Veins: No obvious venous abnormality within the limitations of this arterial phase study. Review of the MIP images confirms the above findings. NON-VASCULAR Lower chest: No acute abnormality. Hepatobiliary: No focal liver abnormality is seen. Status post cholecystectomy. No biliary dilatation. Pancreas: Unremarkable. No pancreatic ductal dilatation or surrounding inflammatory changes. Spleen: Normal in size without focal abnormality. Adrenals/Urinary Tract: Adrenal glands are unremarkable. Kidneys are normal, without renal calculi, focal lesion, or hydronephrosis. Bladder is unremarkable. Stomach/Bowel: There are postsurgical changes in the stomach. There is no evidence for bowel obstruction, pneumatosis or free air. There is diffuse colonic diverticulosis. No acute inflammation identified. The appendix is within normal limits. No active gastrointestinal bleeding identified. There surgical changes in the proximal stomach and small bowel. There is a small hiatal hernia. Lymphatic: No enlarged lymph nodes are identified. Reproductive: Prostate calcifications present. Other: No ascites or focal abdominal wall hernia. There is diffuse body wall edema. Musculoskeletal: There  are mild compression deformities of L3  and L4 which are new from 2021, but favored as chronic. The bones are osteopenic. Left-sided hip screw is present. IMPRESSION: 1. No acute vascular pathology identified. 2. No evidence for active gastrointestinal bleeding. Aortic Atherosclerosis (ICD10-I70.0). NON-VASCULAR 1. No acute localizing process in the abdomen or pelvis. 2. Diffuse colonic diverticulosis without evidence for acute diverticulitis. 3. Diffuse body wall edema. 4. Mild compression deformities of L3 and L4 are new from 2021, but favored as chronic. Please correlate clinically. Electronically Signed   By: Darliss Cheney M.D.   On: 01/18/2023 17:13    Assessment and Plan James Sanford is a 67 y.o. male with medical history significant of atrial fibrillation, hyperlipidemia, dementia presenting with hematochezia.  Patient and wife reports 2 days of bright red blood per rectum.  Currently at rehab facility after left leg injury.  On Eliquis in the setting atrial fibrillation.  Noted prior history of alcohol abuse.  Last drink was March of this year.   GIB (gastrointestinal bleeding)--likely lower --Bright red blood per rectum x 2 days in setting of baseline Eliquis use --Last Eliquis dose this morning --Hemoglobin 5--5.7--3 units BT--7.5 --Will hold Eliquis --IV PPI --Dr. Mia Sanford with gastroenterology consulted-- patient now agreeable with colonoscopy -- patient had a EGD and colonoscopy in September 2023   Alcohol abuse with alcohol-induced mood disorder [facility]) --Patient denies any active alcohol use at present    left femoral fracture [facility]) -- patient getting therapy at [facility] per wife   Essential hypertension Lower limit normal BP with no active GI bleeding  AF (paroxysmal atrial fibrillation) (HCC) On Eliquis --Hold in setting of active GI bleeding   Cardiomyopathy, idiopathic (HCC) --2D echo January 2023 with EF of 50% --Clinically dry in the setting of active GI  bleeding   Diabetes mellitus type 2, uncomplicated (HCC) --Blood sugars in low 100s.  --Hold SSI  --For now w/ active GIB    Family communication :wife on the phone Consults :GI CODE STATUS: full DVT Prophylaxis :Eliquis on hold Level of care: Progressive Status is: Inpatient Remains inpatient appropriate because: GI bleed     TOTAL TIME TAKING CARE OF THIS PATIENT: 35 minutes.  >50% time spent on counselling and coordination of care  Note: This dictation was prepared with Dragon dictation along with smaller phrase technology. Any transcriptional errors that result from this process are unintentional.  Enedina Finner M.D    [facility]   CC: Primary care physician; Jerl Mina, MD

## 2023-01-19 NOTE — Plan of Care (Signed)
  Problem: Clinical Measurements: Goal: Ability to maintain clinical measurements within normal limits will improve Outcome: Progressing   Problem: Safety: Goal: Ability to remain free from injury will improve Outcome: Progressing   Problem: Pain Managment: Goal: General experience of comfort will improve Outcome: Progressing   Problem: Elimination: Goal: Will not experience complications related to urinary retention Outcome: Progressing   Problem: Elimination: Goal: Will not experience complications related to bowel motility Outcome: Progressing   Problem: Coping: Goal: Level of anxiety will decrease Outcome: Progressing

## 2023-01-19 NOTE — Plan of Care (Signed)

## 2023-01-19 NOTE — Progress Notes (Signed)
GI Inpatient Follow-up Note  Subjective:  Patient seen and has had no further bleeding. Denies any symptoms. States he is amenable to colonoscopy.  Scheduled Inpatient Medications:   sodium chloride   Intravenous Once   feeding supplement  1 Container Oral TID BM   pantoprazole (PROTONIX) IV  40 mg Intravenous Q12H    Continuous Inpatient Infusions:    PRN Inpatient Medications:  ondansetron **OR** ondansetron (ZOFRAN) IV  Review of Systems:  Review of Systems  Constitutional:  Negative for chills and fever.  Respiratory:  Negative for shortness of breath.   Gastrointestinal:  Negative for abdominal pain, blood in stool and melena.  Genitourinary:  Negative for dysuria.  Skin:  Negative for rash.  Neurological:  Negative for focal weakness.  Psychiatric/Behavioral:  Negative for substance abuse.   All other systems reviewed and are negative.     Physical Examination: BP 108/71 (BP Location: Left Arm)   Pulse 65   Temp (!) 97.5 F (36.4 C)   Resp 16   Ht 6' (1.829 m)   Wt 68.5 kg   SpO2 100%   BMI 20.48 kg/m  Gen: NAD, alert and oriented x 4 HEENT: PEERLA, EOMI, Neck: supple, no JVD or thyromegaly Chest: No respiratory distress Abd: soft, non-tender, non-distended Ext: no edema, well perfused with 2+ pulses, Skin: no rash or lesions noted Lymph: no LAD  Data: Lab Results  Component Value Date   WBC 5.4 01/18/2023   HGB 7.5 (L) 01/19/2023   HCT 23.3 (L) 01/19/2023   MCV 89.9 01/18/2023   PLT 156 01/18/2023   Recent Labs  Lab 01/18/23 1858 01/19/23 0044 01/19/23 0910  HGB 5.2* 5.7* 7.5*   Lab Results  Component Value Date   NA 143 01/18/2023   K 4.0 01/18/2023   CL 113 (H) 01/18/2023   CO2 22 01/18/2023   BUN 32 (H) 01/18/2023   CREATININE 0.70 01/18/2023   Lab Results  Component Value Date   ALT 23 03/05/2022   AST 29 03/05/2022   ALKPHOS 106 03/05/2022   BILITOT 0.9 03/05/2022   Recent Labs  Lab 01/18/23 1313  INR 2.1*    Assessment/Plan: Mr. Apollo is a 67 y.o. gentleman with history of hypertension, a. Fib on eliquis, and OSA here for two day rectal bleeding. Suspect diverticular. Will plan on colonoscopy tomorrow.  Recommendations:  - maintain two IV's - transfuse to keep hemoglobin > 7 - continue to hold DOAC - clear liquids and NPO at midnight - will plan on colonoscopy tomorrow  Merlyn Lot MD, MPH [facility]

## 2023-01-19 NOTE — Progress Notes (Signed)
OT Cancellation Note  Patient Details Name: James Sanford MRN: [id number] DOB: 07/16/1955   Cancelled Treatment:    Reason Eval/Treat Not Completed: OT screened, no needs identified, will sign off. Pt MOD I with mobility and tasks, reports no OT needs.  Emeline Darling Janique Hoefer 01/19/2023, 11:14 AM

## 2023-01-19 NOTE — Progress Notes (Signed)
There was consult for place 2nd PIV access. But patient has great veins. Patient's RN will try to put in the PIV access. HS [employer]

## 2023-01-20 ENCOUNTER — Encounter
Admission: EM | Disposition: A | Discharge status: Skilled Nursing Facility | Payer: Medicare PPO | Source: Skilled Nursing Facility | Attending: Internal Medicine

## 2023-01-20 ENCOUNTER — Encounter: Payer: Self-pay | Admitting: Family Medicine

## 2023-01-20 ENCOUNTER — Inpatient Hospital Stay: Payer: Medicare PPO | Admitting: Certified Registered Nurse Anesthetist

## 2023-01-20 DIAGNOSIS — I480 Paroxysmal atrial fibrillation: Secondary | ICD-10-CM | POA: Diagnosis not present

## 2023-01-20 DIAGNOSIS — K922 Gastrointestinal hemorrhage, unspecified: Secondary | ICD-10-CM | POA: Diagnosis not present

## 2023-01-20 DIAGNOSIS — I429 Cardiomyopathy, unspecified: Secondary | ICD-10-CM | POA: Diagnosis not present

## 2023-01-20 DIAGNOSIS — D62 Acute posthemorrhagic anemia: Secondary | ICD-10-CM | POA: Diagnosis not present

## 2023-01-20 HISTORY — PX: COLONOSCOPY WITH PROPOFOL: SHX5780

## 2023-01-20 LAB — TYPE AND SCREEN
ABO/RH(D): A POS
Antibody Screen: NEGATIVE
Unit division: 0
Unit division: 0
Unit division: 0

## 2023-01-20 LAB — BPAM RBC
Blood Product Expiration Date: 202411132359
Blood Product Expiration Date: 202411102359
Blood Product Expiration Date: 202411122359
ISSUE DATE / TIME: 202410160458
ISSUE DATE / TIME: 202410151427
ISSUE DATE / TIME: 202410152053
Unit Type and Rh: 5100
Unit Type and Rh: 6200
Unit Type and Rh: 6200

## 2023-01-20 SURGERY — COLONOSCOPY WITH PROPOFOL
Anesthesia: General

## 2023-01-20 MED ORDER — LIDOCAINE HCL (CARDIAC) PF 100 MG/5ML IV SOSY
PREFILLED_SYRINGE | INTRAVENOUS | Status: DC | PRN
  Administered 2023-01-20: 50 mg via INTRAVENOUS

## 2023-01-20 MED ORDER — GLYCOPYRROLATE 0.2 MG/ML IJ SOLN
INTRAMUSCULAR | Status: DC | PRN
  Administered 2023-01-20: .2 mg via INTRAVENOUS

## 2023-01-20 MED ORDER — APIXABAN 5 MG PO TABS
5.0000 mg | ORAL_TABLET | Freq: Two times a day (BID) | ORAL | Status: DC
  Administered 2023-01-21: 5 mg via ORAL
  Filled 2023-01-20: qty 1

## 2023-01-20 MED ORDER — PROPOFOL 500 MG/50ML IV EMUL
INTRAVENOUS | Status: DC | PRN
  Administered 2023-01-20: 100 ug/kg/min via INTRAVENOUS

## 2023-01-20 MED ORDER — PROPOFOL 10 MG/ML IV BOLUS
INTRAVENOUS | Status: DC | PRN
  Administered 2023-01-20: 50 mg via INTRAVENOUS

## 2023-01-20 NOTE — Progress Notes (Signed)
[facility]  - [place] at [facility]   PATIENT NAME: James Sanford    MR#:  [id number]  DATE OF BIRTH:  [birth year]  SUBJECTIVE:  Sitting up at the edge of the bed.  No rectal bleed. Completed prep   VITALS:  Blood pressure 129/82, pulse 62, temperature 98.7 F (37.1 C), resp. rate 20, height 6' (1.829 m), weight 68.5 kg, SpO2 92%.  PHYSICAL EXAMINATION:   GENERAL:  67 y.o.-year-old patient with no acute distress.  LUNGS: Normal breath sounds bilaterally CARDIOVASCULAR: S1, S2 normal.  ABDOMEN: Soft, nontender, nondistended. EXTREMITIES: No  edema b/l.    NEUROLOGIC: nonfocal  patient is alert and awake  LABORATORY PANEL:  CBC Recent Labs  Lab 01/18/23 1301 01/18/23 1858 01/19/23 0910  WBC 5.4  --   --   HGB 5.0*   < > 7.5*  HCT 17.0*   < > 23.3*  PLT 156  --   --    < > = values in this interval not displayed.    Chemistries  Recent Labs  Lab 01/18/23 1301  NA 143  K 4.0  CL 113*  CO2 22  GLUCOSE 103*  BUN 32*  CREATININE 0.70  CALCIUM 8.4*    RADIOLOGY:  CT ANGIO GI BLEED  Result Date: 01/18/2023 CLINICAL DATA:  Lower GI bleeding. EXAM: CTA ABDOMEN AND PELVIS WITHOUT AND WITH CONTRAST TECHNIQUE: Multidetector CT imaging of the abdomen and pelvis was performed using the standard protocol during bolus administration of intravenous contrast. Multiplanar reconstructed images and MIPs were obtained and reviewed to evaluate the vascular anatomy. RADIATION DOSE REDUCTION: This exam was performed according to the departmental dose-optimization program which includes automated exposure control, adjustment of the mA and/or kV according to patient size and/or use of iterative reconstruction technique. CONTRAST:  OMNIPAQUE IOHEXOL 350 MG/ML SOLN COMPARISON:  CTA abdomen and pelvis 01/25/2020. FINDINGS: VASCULAR Aorta: Normal caliber aorta without aneurysm, dissection, vasculitis or significant stenosis. There is moderate calcified atherosclerotic  disease throughout the aorta. Celiac: Patent without evidence of aneurysm, dissection, vasculitis or significant stenosis. SMA: Patent without evidence of aneurysm, dissection, vasculitis or significant stenosis. Renals: Both renal arteries are patent without evidence of aneurysm, dissection, vasculitis, fibromuscular dysplasia or significant stenosis. IMA: Patent without evidence of aneurysm, dissection, vasculitis or significant stenosis. Inflow: Patent without evidence of aneurysm, dissection, vasculitis or significant stenosis. Proximal Outflow: Bilateral common femoral and visualized portions of the superficial and profunda femoral arteries are patent without evidence of aneurysm, dissection, vasculitis or significant stenosis. Veins: No obvious venous abnormality within the limitations of this arterial phase study. Review of the MIP images confirms the above findings. NON-VASCULAR Lower chest: No acute abnormality. Hepatobiliary: No focal liver abnormality is seen. Status post cholecystectomy. No biliary dilatation. Pancreas: Unremarkable. No pancreatic ductal dilatation or surrounding inflammatory changes. Spleen: Normal in size without focal abnormality. Adrenals/Urinary Tract: Adrenal glands are unremarkable. Kidneys are normal, without renal calculi, focal lesion, or hydronephrosis. Bladder is unremarkable. Stomach/Bowel: There are postsurgical changes in the stomach. There is no evidence for bowel obstruction, pneumatosis or free air. There is diffuse colonic diverticulosis. No acute inflammation identified. The appendix is within normal limits. No active gastrointestinal bleeding identified. There surgical changes in the proximal stomach and small bowel. There is a small hiatal hernia. Lymphatic: No enlarged lymph nodes are identified. Reproductive: Prostate calcifications present. Other: No ascites or focal abdominal wall hernia. There is diffuse body wall edema. Musculoskeletal: There are mild  compression deformities of L3 and L4  which are new from 2021, but favored as chronic. The bones are osteopenic. Left-sided hip screw is present. IMPRESSION: 1. No acute vascular pathology identified. 2. No evidence for active gastrointestinal bleeding. Aortic Atherosclerosis (ICD10-I70.0). NON-VASCULAR 1. No acute localizing process in the abdomen or pelvis. 2. Diffuse colonic diverticulosis without evidence for acute diverticulitis. 3. Diffuse body wall edema. 4. Mild compression deformities of L3 and L4 are new from 2021, but favored as chronic. Please correlate clinically. Electronically Signed   By: Darliss Cheney M.D.   On: 01/18/2023 17:13    Assessment and Plan James Sanford is a 67 y.o. male with medical history significant of atrial fibrillation, hyperlipidemia, dementia presenting with hematochezia.  Patient and wife reports 2 days of bright red blood per rectum.  Currently at rehab facility after left leg injury.  On Eliquis in the setting atrial fibrillation.  Noted prior history of alcohol abuse.  Last drink was March of this year.   GIB (gastrointestinal bleeding)--likely lower, ?diverticular --Bright red blood per rectum x 2 days in setting of baseline Eliquis use --Last Eliquis dose this morning --Hemoglobin 5--5.7--3 units BT--7.5 --Will hold Eliquis --IV PPI --Dr. Mia Creek with gastroenterology consulted-- patient now agreeable with colonoscopy -- patient had a EGD and colonoscopy in September 2023 --Colonoscopy today   Alcohol abuse with alcohol-induced mood disorder [facility]) --Patient denies any active alcohol use at present    left femoral fracture [facility]) -- patient getting therapy at [facility] per wife   Essential hypertension Lower limit normal BP with no active GI bleeding  AF (paroxysmal atrial fibrillation) (HCC) On Eliquis --Hold in setting of active GI bleeding   Cardiomyopathy, idiopathic (HCC) --2D echo January 2023 with EF of 50% --Clinically dry in  the setting of active GI bleeding   Diabetes mellitus type 2, uncomplicated (HCC) --Blood sugars in low 100s.  --Hold SSI  --For now w/ active GIB  PT/OT--resume HHPT.    Family communication :wife on the phone Consults :GI CODE STATUS: full DVT Prophylaxis :Eliquis on hold Level of care: Progressive Status is: Inpatient Remains inpatient appropriate because: GI bleed      TOTAL TIME TAKING CARE OF THIS PATIENT: 35 minutes.  >50% time spent on counselling and coordination of care  Note: This dictation was prepared with Dragon dictation along with smaller phrase technology. Any transcriptional errors that result from this process are unintentional.  Enedina Finner M.D    [facility]   CC: Primary care physician; Jerl Mina, MD

## 2023-01-20 NOTE — Evaluation (Addendum)
Occupational Therapy Evaluation Patient Details Name: James Sanford MRN: [id number] DOB: November 09, 1955 Today's Date: 01/20/2023   History of Present Illness 67 y.o. with PMHx of HTN, a. Fib, HLD, arthritis, extensive diverticulosis, dementia, alcohol abuse, and OSA here for two day rectal bleeding.  He refused colonoscopy, but now agreeable, received blood transfusions. Came from ALF.   Clinical Impression   Pt was seen for OT evaluation this date. Prior to hospital admission, pt was residing at ALF where he was MOD I with ADLs and reports no need for assist from ALF staff to complete. He ambulates using a rollator with MOD I and was receiving therapy ~2 days/wk.    Pt presents to acute OT with no current functional decline or deficits reported or noted on evaluation. He demo all bed mobility MOD I and EOB<>BSC transfer with MOD I. He has been getting on [facility] throughout the night on his own and performing hygiene d/t planned colonoscopy today. He demo LB dressing task of doffing/donning socks at EOB with IND. Pt is safe to return to ALF and does not have any acute OT needs with likely no need for OT follow up at ALF. Pt in agreement and wishes to return to ALF with PT services to continue strengthening BLEs.     If plan is discharge home, recommend the following:      Functional Status Assessment  Patient has not had a recent decline in their functional status  Equipment Recommendations  None recommended by OT    Recommendations for Other Services       Precautions / Restrictions Precautions Precautions: None Required Braces or Orthoses: Other Brace Other Brace: shoe lift on L shoe Restrictions Weight Bearing Restrictions: No      Mobility Bed Mobility Overal bed mobility: Modified Independent             General bed mobility comments: MOD I for all bed mobility    Transfers Overall transfer level: Modified independent                 General transfer  comment: MOD I for EOB<>BSC transfer      Balance Overall balance assessment: Modified Independent                                         ADL either performed or assessed with clinical judgement   ADL Overall ADL's : Modified independent                                       General ADL Comments: able to doff/don socks at bedside and perform BSC transfers and hygiene MOD I     Vision         Perception         Praxis         Pertinent Vitals/Pain Pain Assessment Pain Assessment: No/denies pain     Extremity/Trunk Assessment Upper Extremity Assessment Upper Extremity Assessment: Overall WFL for tasks assessed   Lower Extremity Assessment Lower Extremity Assessment: Overall WFL for tasks assessed       Communication Communication Communication: No apparent difficulties   Cognition Arousal: Alert Behavior During Therapy: WFL for tasks assessed/performed Overall Cognitive Status: No family/caregiver present to determine baseline cognitive functioning  General Comments  pt reports being tired from being up/down on [facility] all night d/t planned colonoscopy today    Exercises     Shoulder Instructions      Home Living Family/patient expects to be discharged to:: Assisted living                             Home Equipment: Rollator (4 wheels);Shower seat - built in;Grab bars - tub/shower;Grab bars - toilet   Additional Comments: Per patient reports staying at [facility] in Assisted Living, was recieving rehab approx 2 days/week. Patient reports plan is for him to return to [facility].      Prior Functioning/Environment Prior Level of Function : Independent/Modified Independent             Mobility Comments: Mod I with use of Rollator; was ambulating to/from meals. ADLs Comments: Per patient reports was Mod I with ADLs; did not need assist from staff at  [facility]        OT Problem List:        OT Treatment/Interventions:      OT Goals(Current goals can be found in the care plan section)    OT Frequency:      Co-evaluation              AM-PAC OT "6 Clicks" Daily Activity     Outcome Measure Help from another person eating meals?: None Help from another person taking care of personal grooming?: None Help from another person toileting, which includes using toliet, bedpan, or urinal?: None Help from another person bathing (including washing, rinsing, drying)?: None Help from another person to put on and taking off regular upper body clothing?: None Help from another person to put on and taking off regular lower body clothing?: None 6 Click Score: 24   End of Session Nurse Communication: Mobility status  Activity Tolerance: Patient tolerated treatment well Patient left: in bed;with call bell/phone within reach  OT Visit Diagnosis: Other abnormalities of gait and mobility (R26.89)                Time: 4098-1191 OT Time Calculation (min): 11 min Charges:  OT General Charges $OT Visit: 1 Visit OT Evaluation $OT Eval Low Complexity: 1 Low  Amyria Komar, OTR/L 01/20/23, 9:03 AM  Aundra Millet E Agapito Hanway 01/20/2023, 8:58 AM

## 2023-01-20 NOTE — Transfer of Care (Signed)
Immediate Anesthesia Transfer of Care Note  Patient: James Sanford  Procedure(s) Performed: COLONOSCOPY WITH PROPOFOL  Patient Location: Endoscopy Unit  Anesthesia Type:General  Level of Consciousness: drowsy  Airway & Oxygen Therapy: Patient Spontanous Breathing  Post-op Assessment: Report given to RN and Post -op Vital signs reviewed and stable  Post vital signs: Reviewed and stable  Last Vitals:  Vitals Value Taken Time  BP 102/70 01/20/23 1507  Temp 36.7 C 01/20/23 1507  Pulse 71 01/20/23 1507  Resp 13 01/20/23 1507  SpO2 100 % 01/20/23 1507    Last Pain:  Vitals:   01/20/23 1507  TempSrc: Temporal  PainSc: Asleep         Complications: No notable events documented.

## 2023-01-20 NOTE — TOC Initial Note (Addendum)
Transition of Care [facility]) - Initial/Assessment Note    Patient Details  Name: James Sanford MRN: [id number] Date of Birth: Feb 15, 1956  Transition of Care [facility]) CM/SW Contact:    Darolyn Rua, LCSW Phone Number: 01/20/2023, 10:40 AM  Clinical Narrative:                  Update 4:00 pm: CSW called Diamantina Monks to inform of patient discharge in the morning, inquired if patient still needed to be assessed. Secretary reported she needed to ask Ed who was in the office with her, she proceeded to ask Ed the assessor who stated "they are being buttholes" and reported he did not complete assessment. CSW has escalated this to [facility] supervisor who will follow up with Ed and [employer].    Update 10:45: Patient to go to procedure at 1:30 pm, per MD can take 2-3 hours, MD inquired as to facility assessing patient prior to procedure. CSW called back [facility], informed Debbie of above, she reports Ed their assessor was not available to speak with presently, but she would pass along the message.   10:30 am: CSW spoke with Debbie at [facility] she reports that patient will have to have an assessment by Ed before he returns, Ed will come to bedside at 2:00 pm today to assess to ensure patient can return at discharge.   Expected Discharge Plan:  ([facility]) Barriers to Discharge: Continued Medical Work up   Patient Goals and CMS Choice Patient states their goals for this hospitalization and ongoing recovery are:: to go home CMS Medicare.gov Compare Post Acute Care list provided to:: Patient Choice offered to / list presented to : Patient      Expected Discharge Plan and Services       Living arrangements for the past 2 months:  ([facility] al[facility])                                      Prior Living Arrangements/Services Living arrangements for the past 2 months:  ([facility] alf) Lives with:: Facility Resident                   Activities of Daily Living    ADL Screening (condition at time of admission) Independently performs ADLs?: No Does the patient have a NEW difficulty with bathing/dressing/toileting/self-feeding that is expected to last >3 days?: Yes (Initiates electronic notice to provider for possible OT consult) Does the patient have a NEW difficulty with getting in/out of bed, walking, or climbing stairs that is expected to last >3 days?: Yes (Initiates electronic notice to provider for possible PT consult) Does the patient have a NEW difficulty with communication that is expected to last >3 days?: No Is the patient deaf or have difficulty hearing?: No Does the patient have difficulty seeing, even when wearing glasses/contacts?: Yes Does the patient have difficulty concentrating, remembering, or making decisions?: Yes  Permission Sought/Granted                  Emotional Assessment       Orientation: : Oriented to Self, Oriented to Place, Oriented to  Time, Oriented to Situation Alcohol / Substance Use: Not Applicable Psych Involvement: No (comment)  Admission diagnosis:  Rectal bleeding [K62.5] GIB (gastrointestinal bleeding) [K92.2] Patient Active Problem List   Diagnosis Date Noted   GIB (gastrointestinal bleeding) 01/18/2023   Acute blood  loss anemia 01/18/2023   Femoral fracture (HCC) 08/03/2022   Periprosthetic fracture of shaft of femur 08/03/2022   Alcohol abuse with alcohol-induced mood disorder (HCC) 03/06/2022   Dehydration    AKI (acute kidney injury) (HCC) 03/04/2021   Herpes zoster ophthalmicus of right eye 03/03/2021   Alcohol use disorder, moderate, dependence (HCC) 03/03/2021   Lactic acidosis 03/03/2021   Iron deficiency anemia    Hypokalemia    Diarrhea    Impaired fasting glucose    Malnutrition of moderate degree 12/02/2020   Protein-calorie malnutrition, severe 11/25/2020   Closed nondisplaced intertrochanteric fracture of left femur (HCC) 11/24/2020   Acute lower UTI 11/24/2020    Hyperlipemia    Acute GI bleeding 01/25/2020   Diabetes mellitus type 2, uncomplicated (HCC) 01/25/2020   Hyperlipidemia 01/25/2020   Obesity 01/25/2020   Essential hypertension 01/25/2020   Sleep apnea 01/25/2020   Alcohol abuse 03/07/2019   Cardiomyopathy, idiopathic (HCC) 03/07/2019   AF (paroxysmal atrial fibrillation) (HCC) 11/07/2017   Radius fracture 10/29/2017   PCP:  Jerl Mina, MD Pharmacy:   [facility] [id number] [place], [place] - [address] [place] [address] Phone: [phone] Fax: [phone]     Social Determinants of Health (SDOH) Social History: SDOH Screenings   Food Insecurity: No Food Insecurity (01/18/2023)  Housing: Low Risk  (01/18/2023)  Transportation Needs: No Transportation Needs (01/18/2023)  Utilities: Not At Risk (01/18/2023)  Financial Resource Strain: Patient Declined (12/30/2022)   Received from [facility]  Physical Activity: Unknown (12/29/2020)   Received from [facility], [facility]  Stress: Unknown (12/29/2020)   Received from [facility], [facility]  Tobacco Use: Medium Risk (01/18/2023)  Health Literacy: Low Risk  (12/29/2020)   Received from [facility], [facility]   SDOH Interventions:     Readmission Risk Interventions     No data to display

## 2023-01-20 NOTE — Anesthesia Preprocedure Evaluation (Addendum)
Anesthesia Evaluation  Patient identified by MRN, date of birth, ID band Patient awake    Reviewed: Allergy & Precautions, NPO status , Patient's Chart, lab work & pertinent test results  Airway Mallampati: III  TM Distance: >3 FB Neck ROM: full    Dental  (+) Edentulous Upper, Edentulous Lower   Pulmonary neg pulmonary ROS, sleep apnea    Pulmonary exam normal  + decreased breath sounds      Cardiovascular Exercise Tolerance: Poor hypertension, Pt. on medications + dysrhythmias Atrial Fibrillation  Rhythm:Regular     Neuro/Psych  PSYCHIATRIC DISORDERS      negative neurological ROS  negative psych ROS   GI/Hepatic negative GI ROS, Neg liver ROS,,,  Endo/Other  negative endocrine ROSdiabetes, Type 2    Renal/GU Renal InsufficiencyRenal diseasenegative Renal ROS  negative genitourinary   Musculoskeletal  (+) Arthritis ,    Abdominal Normal abdominal exam  (+)   Peds negative pediatric ROS (+)  Hematology negative hematology ROS (+) Blood dyscrasia, anemia   Anesthesia Other Findings Past Medical History: No date: A-fib (HCC) No date: Anemia No date: Arthritis No date: Hyperlipemia No date: Hypertension No date: Memory deficit No date: Pre-diabetes No date: Sleep apnea  Past Surgical History: 03/09/2022: CATARACT EXTRACTION W/PHACO; Right     Comment:  Procedure: CATARACT EXTRACTION PHACO AND INTRAOCULAR               LENS PLACEMENT (IOC) RIGHT DIABETIC 8.98 01:09.1;                Surgeon: Estanislado Pandy, MD;  Location: [facility];  Service: Ophthalmology;  Laterality:               Right; 12/29/2021: COLONOSCOPY; N/A     Comment:  Procedure: COLONOSCOPY;  Surgeon: Regis Bill,               MD;  Location: [facility] ENDOSCOPY;  Service: Endoscopy;                Laterality: N/A; 01/28/2020: COLONOSCOPY WITH PROPOFOL; N/A     Comment:  Procedure: COLONOSCOPY WITH PROPOFOL;   Surgeon: Toney Reil, MD;  Location: [facility] ENDOSCOPY;  Service:               Gastroenterology;  Laterality: N/A; 01/28/2020: COLONOSCOPY WITH PROPOFOL; N/A     Comment:  Procedure: COLONOSCOPY WITH PROPOFOL;  Surgeon: Toney Reil, MD;  Location: [facility] ENDOSCOPY;  Service:               Gastroenterology;  Laterality: N/A; 12/29/2021: ESOPHAGOGASTRODUODENOSCOPY; N/A     Comment:  Procedure: ESOPHAGOGASTRODUODENOSCOPY (EGD);  Surgeon:               Regis Bill, MD;  Location: [facility] ENDOSCOPY;                Service: Endoscopy;  Laterality: N/A; 01/28/2020: ESOPHAGOGASTRODUODENOSCOPY (EGD) WITH PROPOFOL; N/A     Comment:  Procedure: ESOPHAGOGASTRODUODENOSCOPY (EGD) WITH               PROPOFOL;  Surgeon: Toney Reil, MD;  Location:               [facility] ENDOSCOPY;  Service: Gastroenterology;  Laterality:  N/A; 04/06/2022: FLEXIBLE SIGMOIDOSCOPY; N/A     Comment:  Procedure: FLEXIBLE SIGMOIDOSCOPY;  Surgeon: Regis Bill, MD;  Location: [facility] ENDOSCOPY;  Service:               Endoscopy;  Laterality: N/A; No date: GASTRIC BYPASS No date: HERNIA REPAIR 11/25/2020: INTRAMEDULLARY (IM) NAIL INTERTROCHANTERIC; Left     Comment:  Procedure: INTRAMEDULLARY (IM) NAIL INTERTROCHANTRIC;                Surgeon: Lyndle Herrlich, MD;  Location: [facility] ORS;                Service: Orthopedics;  Laterality: Left; No date: JOINT REPLACEMENT; Bilateral     Comment:  Knee surgery 10/30/2017: OPEN REDUCTION INTERNAL FIXATION (ORIF) DISTAL RADIAL  FRACTURE; Right     Comment:  Procedure: OPEN REDUCTION INTERNAL FIXATION (ORIF)               DISTAL RADIAL FRACTURE;  Surgeon: Garnette Gunner, MD;                Location: [facility] ORS;  Service: Orthopedics;  Laterality:               Right; 01/29/2020: ORIF FEMUR FRACTURE; Right     Comment:  Procedure: OPEN REDUCTION INTERNAL FIXATION (ORIF)               DISTAL FEMUR FRACTURE;   Surgeon: Christena Flake, MD;                Location: [facility] ORS;  Service: Orthopedics;  Laterality:               Right; 08/04/2022: ORIF FEMUR FRACTURE; Left     Comment:  Procedure: OPEN REDUCTION INTERNAL FIXATION (ORIF)               DISTAL FEMUR FRACTURE;  Surgeon: Roby Lofts, MD;                Location: [facility] OR;  Service: Orthopedics;  Laterality:               Left;  BMI    Body Mass Index: 20.48 kg/m      Reproductive/Obstetrics negative OB ROS                             Anesthesia Physical Anesthesia Plan  ASA: 3  Anesthesia Plan: General   Post-op Pain Management:    Induction: Intravenous  PONV Risk Score and Plan: Propofol infusion and TIVA  Airway Management Planned: Natural Airway and Nasal Cannula  Additional Equipment:   Intra-op Plan:   Post-operative Plan:   Informed Consent: I have reviewed the patients History and Physical, chart, labs and discussed the procedure including the risks, benefits and alternatives for the proposed anesthesia with the patient or authorized representative who has indicated his/her understanding and acceptance.     Dental Advisory Given  Plan Discussed with: CRNA and Surgeon  Anesthesia Plan Comments:        Anesthesia Quick Evaluation

## 2023-01-20 NOTE — Plan of Care (Signed)

## 2023-01-20 NOTE — Plan of Care (Signed)
  Problem: Education: Goal: Knowledge of General Education information will improve Description: Including pain rating scale, medication(s)/side effects and non-pharmacologic comfort measures Outcome: Progressing   Problem: Clinical Measurements: Goal: Ability to maintain clinical measurements within normal limits will improve Outcome: Progressing   Problem: Activity: Goal: Risk for activity intolerance will decrease Outcome: Progressing   Problem: Clinical Measurements: Goal: Postoperative complications will be avoided or minimized Outcome: Progressing   Problem: Respiratory: Goal: Will regain and/or maintain adequate ventilation Outcome: Progressing

## 2023-01-20 NOTE — Op Note (Signed)
[facility] Patient Name: James Sanford Procedure Date: 01/20/2023 2:40 PM MRN: [id number] Account #: [id number] Date of Birth: Mar 20, 1956 Admit Type: Inpatient Age: 67 Room: [facility] ENDO ROOM 1 Gender: Male Note Status: Finalized Instrument Name: Colonoscope [id number] Procedure:             Colonoscopy Indications:           Rectal bleeding Providers:             Eather Colas MD, MD Referring MD:          Rhona Leavens. Burnett Sheng, MD (Referring MD) Medicines:             Monitored Anesthesia Care Complications:         No immediate complications. Procedure:             Pre-Anesthesia Assessment:                        - Prior to the procedure, a History and Physical was                         performed, and patient medications and allergies were                         reviewed. The patient is competent. The risks and                         benefits of the procedure and the sedation options and                         risks were discussed with the patient. All questions                         were answered and informed consent was obtained.                         Patient identification and proposed procedure were                         verified by the physician, the nurse, the                         anesthesiologist, the anesthetist and the technician                         in the endoscopy suite. Mental Status Examination:                         alert and oriented. Airway Examination: normal                         oropharyngeal airway and neck mobility. Respiratory                         Examination: clear to auscultation. CV Examination:                         normal. Prophylactic Antibiotics: The patient does not  require prophylactic antibiotics. Prior                         Anticoagulants: The patient has taken Eliquis                         (apixaban), last dose was 2 days prior to procedure.                          ASA Grade Assessment: III - A patient with severe                         systemic disease. After reviewing the risks and                         benefits, the patient was deemed in satisfactory                         condition to undergo the procedure. The anesthesia                         plan was to use monitored anesthesia care (MAC).                         Immediately prior to administration of medications,                         the patient was re-assessed for adequacy to receive                         sedatives. The heart rate, respiratory rate, oxygen                         saturations, blood pressure, adequacy of pulmonary                         ventilation, and response to care were monitored                         throughout the procedure. The physical status of the                         patient was re-assessed after the procedure.                        After obtaining informed consent, the colonoscope was                         passed under direct vision. Throughout the procedure,                         the patient's blood pressure, pulse, and oxygen                         saturations were monitored continuously. The                         Colonoscope was introduced through the anus with the  intention of advancing to the cecum. The scope was                         advanced to the transverse colon before the procedure                         was aborted. Medications were given. The colonoscopy                         was aborted due to poor bowel prep and significant                         looping. Changing the patient to a prone position and                         applying abdominal pressure did not allow for the                         successful completion of the procedure. The patient                         tolerated the procedure well. The quality of the bowel                         preparation was poor. The rectum was  photographed. Findings:      The perianal and digital rectal examinations were normal.      Multiple small-mouthed diverticula were found in the sigmoid colon,       descending colon and transverse colon.      Internal hemorrhoids were found during retroflexion. The hemorrhoids       were Grade II (internal hemorrhoids that prolapse but reduce       spontaneously).      The exam was otherwise without abnormality on direct and retroflexion       views. Impression:            - The procedure was aborted due to poor bowel prep and                         significant looping.                        - Preparation of the colon was poor.                        - Diverticulosis in the sigmoid colon, in the                         descending colon and in the transverse colon.                        - Internal hemorrhoids.                        - The examination was otherwise normal on direct and                         retroflexion views.                        -  No specimens collected. Recommendation:        - Return patient to hospital ward for ongoing care.                        - Advance diet as tolerated.                        - Resume Eliquis (apixaban) at prior dose tomorrow.                        - Suspect bleeding was either diverticular vs                         hemorrhoidal in nature.                        - Consider CT colonography as an outpatient Procedure Code(s):     --- Professional ---                        [id number], 53, Colonoscopy, flexible; diagnostic,                         including collection of specimen(s) by brushing or                         washing, when performed (separate procedure) Diagnosis Code(s):     --- Professional ---                        K64.1, Second degree hemorrhoids                        K62.5, Hemorrhage of anus and rectum                        K57.30, Diverticulosis of large intestine without                         perforation or abscess  without bleeding CPT copyright 2022 American Medical Association. All rights reserved. The codes documented in this report are preliminary and upon coder review may  be revised to meet current compliance requirements. Eather Colas MD, MD 01/20/2023 3:06:48 PM Number of Addenda: 0 Note Initiated On: 01/20/2023 2:40 PM Scope Withdrawal Time: 0 hours 0 minutes 1 second  Total Procedure Duration: 0 hours 11 minutes 44 seconds  Estimated Blood Loss:  Estimated blood loss: none.      [facility]

## 2023-01-21 ENCOUNTER — Encounter: Payer: Self-pay | Admitting: Gastroenterology

## 2023-01-21 DIAGNOSIS — K922 Gastrointestinal hemorrhage, unspecified: Secondary | ICD-10-CM | POA: Diagnosis not present

## 2023-01-21 LAB — HEMOGLOBIN AND HEMATOCRIT, BLOOD
HCT: 24.3 % — ABNORMAL LOW (ref 39.0–52.0)
Hemoglobin: 7.7 g/dL — ABNORMAL LOW (ref 13.0–17.0)

## 2023-01-21 NOTE — Plan of Care (Signed)

## 2023-01-21 NOTE — Plan of Care (Signed)
[facility] Tonsillectomy/Adenoidectomy, Postoperative PEDS care plan entered in error.

## 2023-01-21 NOTE — Care Management Important Message (Signed)
Important Message  Patient Details  Name: James Sanford MRN: [id number] Date of Birth: [birth year]   Important Message Given:  Yes - Medicare IM     Dyllin Gulley, Stephan Minister 01/21/2023, 2:05 PM

## 2023-01-21 NOTE — Anesthesia Postprocedure Evaluation (Signed)
Anesthesia Post Note  Patient: James Sanford  Procedure(s) Performed: COLONOSCOPY WITH PROPOFOL  Patient location during evaluation: PACU Anesthesia Type: General Level of consciousness: awake Pain management: satisfactory to patient Vital Signs Assessment: post-procedure vital signs reviewed and stable Respiratory status: spontaneous breathing and nonlabored ventilation Cardiovascular status: stable Anesthetic complications: no   No notable events documented.   Last Vitals:  Vitals:   01/20/23 2325 01/21/23 0325  BP: 110/73 114/76  Pulse: (!) 58 (!) 55  Resp: 14 14  Temp: 36.7 C 36.7 C  SpO2: 97% 97%    Last Pain:  Vitals:   01/20/23 2039  TempSrc: Oral  PainSc: 0-No pain                 VAN STAVEREN,Alfie Rideaux

## 2023-01-21 NOTE — TOC Progression Note (Signed)
Transition of Care [facility]) - Progression Note    Patient Details  Name: James Sanford MRN: [id number] Date of Birth: [birth year]  Transition of Care [facility]) CM/SW Contact  Darolyn Rua, [place] Phone Number: 01/21/2023, 10:30 AM  Clinical Narrative:     CSW has followed up with [facility] supervisor regarding connecting with Diamantina Monks for discharge, as 10/17 [facility] reported needed to complete bedside assessment prior to patient return, unsuccessful attempts made at this time. TOC supervisor to follow up.   Expected Discharge Plan:  ([facility] alf) Barriers to Discharge: Continued Medical Work up  Expected Discharge Plan and Services       Living arrangements for the past 2 months:  ([facility] alf)                                       Social Determinants of Health (SDOH) Interventions SDOH Screenings   Food Insecurity: No Food Insecurity (01/18/2023)  Housing: Low Risk  (01/18/2023)  Transportation Needs: No Transportation Needs (01/18/2023)  Utilities: Not At Risk (01/18/2023)  Financial Resource Strain: Patient Declined (12/30/2022)   Received from [facility]  Physical Activity: Unknown (12/29/2020)   Received from [facility], [facility]  Stress: Unknown (12/29/2020)   Received from [facility], [facility]  Tobacco Use: Medium Risk (01/20/2023)  Health Literacy: Low Risk  (12/29/2020)   Received from [facility], [facility]    Readmission Risk Interventions     No data to display

## 2023-01-21 NOTE — Discharge Instructions (Signed)
To apply for disability: Call us at [phone] from 7 a.m. to 7 p.m. Monday through Friday. Or Visit your local social security office (Call first to schedule appt).  Physical Address: [address] [place], [place] [address] Phone: [phone] TTY: [phone] Fax: [phone] Hours:  Monday 9:00 AM - 4:00 PM Tuesday 9:00 AM - 4:00 PM Wednesday 9:00 AM - 4:00 PM Thursday 9:00 AM - 4:00 PM Friday 9:00 AM - 4:00 PM Saturday Closed Sunday Closed

## 2023-01-21 NOTE — [place] FL2 (Signed)
[place] MEDICAID FL2 LEVEL OF CARE FORM     IDENTIFICATION  Patient Name: James Sanford Birthdate: 1955/09/06 Sex: male Admission Date (Current Location): 01/18/2023  [place] and [place] Number:  Chiropodist and Address:  [facility], [address] [place], [place] [address]      Provider Number: [id number]  Attending Physician Name and Address:  Lurene Shadow, MD  Relative Name and Phone Number:       Current Level of Care: Hospital Recommended Level of Care: Assisted Living Facility (with PT through [facility]) Prior Approval Number:    Date Approved/Denied:   PASRR Number:    Discharge Plan: Other (Comment) (ALF with PT through [facility])    Current Diagnoses: Patient Active Problem List   Diagnosis Date Noted   GIB (gastrointestinal bleeding) 01/18/2023   Acute blood loss anemia 01/18/2023   Femoral fracture (HCC) 08/03/2022   Periprosthetic fracture of shaft of femur 08/03/2022   Alcohol abuse with alcohol-induced mood disorder (HCC) 03/06/2022   Dehydration    AKI (acute kidney injury) (HCC) 03/04/2021   Herpes zoster ophthalmicus of right eye 03/03/2021   Alcohol use disorder, moderate, dependence (HCC) 03/03/2021   Lactic acidosis 03/03/2021   Iron deficiency anemia    Hypokalemia    Diarrhea    Impaired fasting glucose    Malnutrition of moderate degree 12/02/2020   Protein-calorie malnutrition, severe 11/25/2020   Closed nondisplaced intertrochanteric fracture of left femur (HCC) 11/24/2020   Acute lower UTI 11/24/2020   Hyperlipemia    Acute GI bleeding 01/25/2020   Diabetes mellitus type 2, uncomplicated (HCC) 01/25/2020   Hyperlipidemia 01/25/2020   Obesity 01/25/2020   Essential hypertension 01/25/2020   Sleep apnea 01/25/2020   Alcohol abuse 03/07/2019   Cardiomyopathy, idiopathic (HCC) 03/07/2019   AF (paroxysmal atrial fibrillation) (HCC) 11/07/2017   Radius fracture 10/29/2017     Orientation RESPIRATION BLADDER Height & Weight     Self, Time, Situation, Place  Normal Continent Weight: 150 lb 15.9 oz (68.5 kg) Height:  6' (182.9 cm)  BEHAVIORAL SYMPTOMS/MOOD NEUROLOGICAL BOWEL NUTRITION STATUS   (None)   Continent Diet (Low sodium heart healthy)  AMBULATORY STATUS COMMUNICATION OF NEEDS Skin   Supervision Verbally Normal                       Personal Care Assistance Level of Assistance  Bathing, Feeding, Dressing Bathing Assistance: Independent (Modified) Feeding assistance: Independent Dressing Assistance: Independent (Modified)     Functional Limitations Info  Sight, Hearing, Speech Sight Info: Adequate Hearing Info: Adequate Speech Info: Adequate    SPECIAL CARE FACTORS FREQUENCY  PT (By licensed PT)     PT Frequency: 3 x week              Contractures Contractures Info: Not present    Additional Factors Info  Code Status, Allergies Code Status Info: Full code Allergies Info: Penicillins, Magnolia           Current Medications (01/21/2023):  This is the current hospital active medication list Current Facility-Administered Medications  Medication Dose Route Frequency Provider Last Rate Last Admin   apixaban (ELIQUIS) tablet 5 mg  5 mg Oral BID Enedina Finner, MD   5 mg at 01/21/23 0905   feeding supplement (BOOST / RESOURCE BREEZE) liquid 1 Container  1 Container Oral TID BM Floydene Flock, MD   1 Container at 01/21/23 0905   ondansetron (ZOFRAN) tablet 4 mg  4  mg Oral Q6H PRN Floydene Flock, MD       Or   ondansetron [facility]) injection 4 mg  4 mg Intravenous Q6H PRN Floydene Flock, MD       pantoprazole (PROTONIX) injection 40 mg  40 mg Intravenous Q12H Floydene Flock, MD   40 mg at 01/21/23 0905     Discharge Medications: STOP taking these medications     Acular 0.5 % ophthalmic solution Generic drug: ketorolac    docusate sodium 100 MG capsule Commonly known as: COLACE    HYDROcodone-acetaminophen 5-325  MG tablet Commonly known as: NORCO/VICODIN    Ilevro 0.3 % ophthalmic suspension Generic drug: nepafenac    potassium chloride SA 20 MEQ tablet Commonly known as: KLOR-CON M    prednisoLONE acetate 1 % ophthalmic suspension Commonly known as: PRED FORTE    Vitamin D (Ergocalciferol) 1.25 MG (50000 UNIT) Caps capsule Commonly known as: DRISDOL    Xifaxan 550 MG Tabs tablet Generic drug: rifaximin           TAKE these medications     Caltrate 600+D3 Soft 600-20 MG-MCG Chew Generic drug: Calcium Carb-Cholecalciferol Chew 1 tablet by mouth daily.    Creon 24000-76000 units Cpep Generic drug: Pancrelipase (Lip-Prot-Amyl) Take 2 capsules by mouth 4 (four) times daily -  before meals and at bedtime.    Eliquis 5 MG Tabs tablet Generic drug: apixaban Take 5 mg by mouth 2 (two) times daily.    multivitamin with minerals Tabs tablet Take 1 tablet by mouth daily.    omega-3 acid ethyl esters 1 g capsule Commonly known as: LOVAZA Take 1 g by mouth daily.    polyethylene glycol 17 g packet Commonly known as: MIRALAX / GLYCOLAX Take 17 g by mouth daily as needed for mild constipation.    Vigamox 0.5 % ophthalmic solution Generic drug: moxifloxacin Place 1 drop into the right eye 4 (four) times daily.    Vitamin D 50 MCG (2000 UT) Caps Take 1 capsule by mouth daily.    Relevant Imaging Results:  Relevant Lab Results:   Additional Information SS#: [id number]  Margarito Liner, LCSW

## 2023-01-21 NOTE — Discharge Summary (Signed)
Physician Discharge Summary   Patient: James Sanford MRN: [id number] DOB: [birth year]  Admit date:     01/18/2023  Discharge date: 01/21/23  Discharge Physician: Lurene Shadow   PCP: Jerl Mina, MD   Recommendations at discharge:   Follow-up with PCP in 1 week  Discharge Diagnoses: Principal Problem:   GIB (gastrointestinal bleeding) Active Problems:   Alcohol abuse with alcohol-induced mood disorder (HCC)   Diabetes mellitus type 2, uncomplicated (HCC)   Cardiomyopathy, idiopathic (HCC)   AF (paroxysmal atrial fibrillation) (HCC)   Essential hypertension   Femoral fracture (HCC)   Acute blood loss anemia  Resolved Problems:   * No resolved hospital problems. [facility] Course:  Mr.  James Sanford is a 67 y.o. male with medical history significant for atrial fibrillation on Eliquis, dementia, hyperlipidemia, history of alcohol use disorder, who presented to the hospital with rectal bleeding.  There was no abdominal pain or hematemesis.     Assessment and Plan:   Acute GI bleeding: Resolved.  Suspected to be from diverticular bleed versus internal hemorrhoids.  S/p colonoscopy which showed diverticulosis and grade 2 internal hemorrhoids.  Bowel prep was poor.   Paroxysmal atrial fibrillation: Continue Eliquis at discharge   History of left femoral fracture s/p ORIF of left femur in May 2024.22   Other comorbidities include hypertension, idiopathic cardiomyopathy, type II DM   His condition has improved and he is deemed stable for discharge today.      Consultants: Gastroenterologist Procedures performed: Colonoscopy Disposition: Assisted living Diet recommendation:  Discharge Diet Orders (From admission, onward)     Start     Ordered   01/21/23 0000  Diet - low sodium heart healthy        01/21/23 1357           Carb modified diet DISCHARGE MEDICATION: Allergies as of 01/21/2023       Reactions   Penicillins Diarrhea    TOLERATED CEFAZOLIN 11/25/20 Unknown childhood reaction   Magnolia         Medication List     STOP taking these medications    Acular 0.5 % ophthalmic solution Generic drug: ketorolac   docusate sodium 100 MG capsule Commonly known as: COLACE   HYDROcodone-acetaminophen 5-325 MG tablet Commonly known as: NORCO/VICODIN   Ilevro 0.3 % ophthalmic suspension Generic drug: nepafenac   potassium chloride SA 20 MEQ tablet Commonly known as: KLOR-CON M   prednisoLONE acetate 1 % ophthalmic suspension Commonly known as: PRED FORTE   Vitamin D (Ergocalciferol) 1.25 MG (50000 UNIT) Caps capsule Commonly known as: DRISDOL   Xifaxan 550 MG Tabs tablet Generic drug: rifaximin       TAKE these medications    Caltrate 600+D3 Soft 600-20 MG-MCG Chew Generic drug: Calcium Carb-Cholecalciferol Chew 1 tablet by mouth daily.   Creon 24000-76000 units Cpep Generic drug: Pancrelipase (Lip-Prot-Amyl) Take 2 capsules by mouth 4 (four) times daily -  before meals and at bedtime.   Eliquis 5 MG Tabs tablet Generic drug: apixaban Take 5 mg by mouth 2 (two) times daily.   multivitamin with minerals Tabs tablet Take 1 tablet by mouth daily.   omega-3 acid ethyl esters 1 g capsule Commonly known as: LOVAZA Take 1 g by mouth daily.   polyethylene glycol 17 g packet Commonly known as: MIRALAX / GLYCOLAX Take 17 g by mouth daily as needed for mild constipation.   Vigamox 0.5 % ophthalmic solution Generic drug: moxifloxacin Place 1 drop into the right  eye 4 (four) times daily.   Vitamin D 50 MCG (2000 UT) Caps Take 1 capsule by mouth daily.        Discharge Exam: Filed Weights   01/18/23 1252 01/20/23 1336  Weight: 68.5 kg 68.5 kg   GEN: NAD SKIN: Warm and dry  EYES: No pallor or icterus ENT: MMM CV: RRR PULM: CTA B ABD: soft, ND, NT, +BS CNS: AAO x 3, non focal EXT: No edema or tenderness   Condition at discharge: good  The results of significant diagnostics  from this hospitalization (including imaging, microbiology, ancillary and laboratory) are listed below for reference.   Imaging Studies: CT ANGIO GI BLEED  Result Date: 01/18/2023 CLINICAL DATA:  Lower GI bleeding. EXAM: CTA ABDOMEN AND PELVIS WITHOUT AND WITH CONTRAST TECHNIQUE: Multidetector CT imaging of the abdomen and pelvis was performed using the standard protocol during bolus administration of intravenous contrast. Multiplanar reconstructed images and MIPs were obtained and reviewed to evaluate the vascular anatomy. RADIATION DOSE REDUCTION: This exam was performed according to the departmental dose-optimization program which includes automated exposure control, adjustment of the mA and/or kV according to patient size and/or use of iterative reconstruction technique. CONTRAST:  OMNIPAQUE IOHEXOL 350 MG/ML SOLN COMPARISON:  CTA abdomen and pelvis 01/25/2020. FINDINGS: VASCULAR Aorta: Normal caliber aorta without aneurysm, dissection, vasculitis or significant stenosis. There is moderate calcified atherosclerotic disease throughout the aorta. Celiac: Patent without evidence of aneurysm, dissection, vasculitis or significant stenosis. SMA: Patent without evidence of aneurysm, dissection, vasculitis or significant stenosis. Renals: Both renal arteries are patent without evidence of aneurysm, dissection, vasculitis, fibromuscular dysplasia or significant stenosis. IMA: Patent without evidence of aneurysm, dissection, vasculitis or significant stenosis. Inflow: Patent without evidence of aneurysm, dissection, vasculitis or significant stenosis. Proximal Outflow: Bilateral common femoral and visualized portions of the superficial and profunda femoral arteries are patent without evidence of aneurysm, dissection, vasculitis or significant stenosis. Veins: No obvious venous abnormality within the limitations of this arterial phase study. Review of the MIP images confirms the above findings. NON-VASCULAR  Lower chest: No acute abnormality. Hepatobiliary: No focal liver abnormality is seen. Status post cholecystectomy. No biliary dilatation. Pancreas: Unremarkable. No pancreatic ductal dilatation or surrounding inflammatory changes. Spleen: Normal in size without focal abnormality. Adrenals/Urinary Tract: Adrenal glands are unremarkable. Kidneys are normal, without renal calculi, focal lesion, or hydronephrosis. Bladder is unremarkable. Stomach/Bowel: There are postsurgical changes in the stomach. There is no evidence for bowel obstruction, pneumatosis or free air. There is diffuse colonic diverticulosis. No acute inflammation identified. The appendix is within normal limits. No active gastrointestinal bleeding identified. There surgical changes in the proximal stomach and small bowel. There is a small hiatal hernia. Lymphatic: No enlarged lymph nodes are identified. Reproductive: Prostate calcifications present. Other: No ascites or focal abdominal wall hernia. There is diffuse body wall edema. Musculoskeletal: There are mild compression deformities of L3 and L4 which are new from 2021, but favored as chronic. The bones are osteopenic. Left-sided hip screw is present. IMPRESSION: 1. No acute vascular pathology identified. 2. No evidence for active gastrointestinal bleeding. Aortic Atherosclerosis (ICD10-I70.0). NON-VASCULAR 1. No acute localizing process in the abdomen or pelvis. 2. Diffuse colonic diverticulosis without evidence for acute diverticulitis. 3. Diffuse body wall edema. 4. Mild compression deformities of L3 and L4 are new from 2021, but favored as chronic. Please correlate clinically. Electronically Signed   By: Darliss Cheney M.D.   On: 01/18/2023 17:13    Microbiology: Results for orders placed or performed during the  hospital encounter of 08/03/22  Surgical pcr screen     Status: None   Collection Time: 08/04/22 11:31 AM   Specimen: Nasal Mucosa; Nasal Swab  Result Value Ref Range Status    MRSA, PCR NEGATIVE NEGATIVE Final   Staphylococcus aureus NEGATIVE NEGATIVE Final    Comment: (NOTE) The Xpert SA Assay (FDA approved for NASAL specimens in patients 36 years of age and older), is one component of a comprehensive surveillance program. It is not intended to diagnose infection nor to guide or monitor treatment. Performed at [facility], [address], [place], [place] [address]     Labs: CBC: Recent Labs  Lab 01/18/23 1301 01/18/23 1858 01/19/23 0044 01/19/23 0910 01/21/23 0850  WBC 5.4  --   --   --   --   HGB 5.0* 5.2* 5.7* 7.5* 7.7*  HCT 17.0* 16.6* 17.6* 23.3* 24.3*  MCV 89.9  --   --   --   --   PLT 156  --   --   --   --    Basic Metabolic Panel: Recent Labs  Lab 01/18/23 1301  NA 143  K 4.0  CL 113*  CO2 22  GLUCOSE 103*  BUN 32*  CREATININE 0.70  CALCIUM 8.4*   Liver Function Tests: No results for input(s): "AST", "ALT", "ALKPHOS", "BILITOT", "PROT", "ALBUMIN" in the last 168 hours. CBG: No results for input(s): "GLUCAP" in the last 168 hours.  Discharge time spent: greater than 30 minutes.  Signed: Lurene Shadow, MD [facility] 01/21/2023

## 2023-01-21 NOTE — Plan of Care (Signed)
  Problem: Education: Goal: Knowledge of General Education information will improve Description: Including pain rating scale, medication(s)/side effects and non-pharmacologic comfort measures 01/21/2023 1429 by Lars Pinks, RN Outcome: Adequate for Discharge 01/21/2023 1352 by Lars Pinks, RN Outcome: Progressing   Problem: Health Behavior/Discharge Planning: Goal: Ability to manage health-related needs will improve 01/21/2023 1429 by Lars Pinks, RN Outcome: Adequate for Discharge 01/21/2023 1352 by Lars Pinks, RN Outcome: Progressing   Problem: Clinical Measurements: Goal: Ability to maintain clinical measurements within normal limits will improve 01/21/2023 1429 by Lars Pinks, RN Outcome: Adequate for Discharge 01/21/2023 1352 by Lars Pinks, RN Outcome: Progressing Goal: Will remain free from infection 01/21/2023 1429 by Lars Pinks, RN Outcome: Adequate for Discharge 01/21/2023 1352 by Lars Pinks, RN Outcome: Progressing Goal: Diagnostic test results will improve 01/21/2023 1429 by Lars Pinks, RN Outcome: Adequate for Discharge 01/21/2023 1352 by Lars Pinks, RN Outcome: Progressing Goal: Respiratory complications will improve 01/21/2023 1429 by Lars Pinks, RN Outcome: Adequate for Discharge 01/21/2023 1352 by Lars Pinks, RN Outcome: Progressing Goal: Cardiovascular complication will be avoided 01/21/2023 1429 by Lars Pinks, RN Outcome: Adequate for Discharge 01/21/2023 1352 by Lars Pinks, RN Outcome: Progressing   Problem: Activity: Goal: Risk for activity intolerance will decrease 01/21/2023 1429 by Lars Pinks, RN Outcome: Adequate for Discharge 01/21/2023 1352 by Lars Pinks, RN Outcome: Progressing   Problem: Nutrition: Goal: Adequate nutrition will be maintained 01/21/2023 1429 by Lars Pinks, RN Outcome: Adequate for Discharge 01/21/2023 1352 by Lars Pinks, RN Outcome: Progressing   Problem: Coping: Goal: Level of anxiety will decrease 01/21/2023 1429 by Lars Pinks, RN Outcome: Adequate for Discharge 01/21/2023 1352 by Lars Pinks, RN Outcome: Progressing   Problem: Elimination: Goal: Will not experience complications related to bowel motility 01/21/2023 1429 by Lars Pinks, RN Outcome: Adequate for Discharge 01/21/2023 1352 by Lars Pinks, RN Outcome: Progressing Goal: Will not experience complications related to urinary retention 01/21/2023 1429 by Lars Pinks, RN Outcome: Adequate for Discharge 01/21/2023 1352 by Lars Pinks, RN Outcome: Progressing   Problem: Pain Managment: Goal: General experience of comfort will improve 01/21/2023 1429 by Lars Pinks, RN Outcome: Adequate for Discharge 01/21/2023 1352 by Lars Pinks, RN Outcome: Progressing   Problem: Safety: Goal: Ability to remain free from injury will improve 01/21/2023 1429 by Lars Pinks, RN Outcome: Adequate for Discharge 01/21/2023 1352 by Lars Pinks, RN Outcome: Progressing   Problem: Skin Integrity: Goal: Risk for impaired skin integrity will decrease 01/21/2023 1429 by Lars Pinks, RN Outcome: Adequate for Discharge 01/21/2023 1352 by Lars Pinks, RN Outcome: Progressing   Problem: Education: Goal: Understanding of post-operative needs will improve 01/21/2023 1429 by Lars Pinks, RN Outcome: Adequate for Discharge 01/21/2023 1352 by Lars Pinks, RN Outcome: Progressing Goal: Individualized Educational Video(s) 01/21/2023 1429 by Lars Pinks, RN Outcome: Adequate for Discharge 01/21/2023 1352 by Lars Pinks, RN Outcome: Progressing   Problem: Clinical Measurements: Goal: Postoperative complications will be avoided or minimized 01/21/2023 1429 by Lars Pinks, RN Outcome: Adequate for Discharge 01/21/2023 1352 by Lars Pinks, RN Outcome: Progressing    Problem: Respiratory: Goal: Will regain and/or maintain adequate ventilation 01/21/2023 1429 by Lars Pinks, RN Outcome: Adequate for Discharge 01/21/2023 1352 by Lars Pinks, RN Outcome: Progressing

## 2023-01-21 NOTE — TOC Transition Note (Signed)
Transition of Care [facility]) - CM/SW Discharge Note   Patient Details  Name: James Sanford MRN: [id number] Date of Birth: Jan 23, 1956  Transition of Care [facility]) CM/SW Contact:  Margarito Liner, LCSW Phone Number: 01/21/2023, 3:20 PM   Clinical Narrative: Patient has orders to discharge back to [facility] today. They did not receive FL2 and discharge summary that were faxed so they gave permission for CSW to send with patient. CSW placed documents in a discharge packet that was stapled shut. RN will call report to [phone]. Wife will transport. No further concerns. CSW signing off.    Final next level of care: Assisted Living Barriers to Discharge: Barriers Resolved   Patient Goals and CMS Choice CMS Medicare.gov Compare Post Acute Care list provided to:: Patient Choice offered to / list presented to : Patient  Discharge Placement                  Patient to be transferred to facility by: Wife Name of family member notified: Dody Schrier Patient and family notified of of transfer: 01/21/23  Discharge Plan and Services Additional resources added to the After Visit Summary for                                       Social Determinants of Health (SDOH) Interventions SDOH Screenings   Food Insecurity: No Food Insecurity (01/18/2023)  Housing: Low Risk  (01/18/2023)  Transportation Needs: No Transportation Needs (01/18/2023)  Utilities: Not At Risk (01/18/2023)  Financial Resource Strain: Patient Declined (12/30/2022)   Received from [facility]  Physical Activity: Unknown (12/29/2020)   Received from [facility], [facility]  Stress: Unknown (12/29/2020)   Received from [facility], [facility]  Tobacco Use: Medium Risk (01/20/2023)  Health Literacy: Low Risk  (12/29/2020)   Received from [facility], [facility]     Readmission Risk Interventions     No data to display

## 2023-01-21 NOTE — TOC Progression Note (Addendum)
Transition of Care [facility]) - Progression Note    Patient Details  Name: James Sanford MRN: [id number] Date of Birth: [birth year]  Transition of Care [facility]) CM/SW Contact  Harriet Masson, RN Phone Number: 01/21/2023, 12:58 PM  Clinical Narrative:    1258 Left message with Ed's secretary, Eunice Blase, in regards to ED coming to hospital to do assessment.Debbie states Ed will come do assessment today after lunch and will notified this RNCM.  Patient is medically ready for discharge.  1314 Spoke to [place] at [employer] who states Patient can return without a hospital assessment. This RNCM notified Maralyn Sago, CSW.     Expected Discharge Plan:  ([facility]) Barriers to Discharge: Continued Medical Work up  Expected Discharge Plan and Services       Living arrangements for the past 2 months:  ([facility])                                       Social Determinants of Health (SDOH) Interventions SDOH Screenings   Food Insecurity: No Food Insecurity (01/18/2023)  Housing: Low Risk  (01/18/2023)  Transportation Needs: No Transportation Needs (01/18/2023)  Utilities: Not At Risk (01/18/2023)  Financial Resource Strain: Patient Declined (12/30/2022)   Received from [facility]  Physical Activity: Unknown (12/29/2020)   Received from [facility], [facility]  Stress: Unknown (12/29/2020)   Received from [facility], [facility]  Tobacco Use: Medium Risk (01/20/2023)  Health Literacy: Low Risk  (12/29/2020)   [facility] from [facility]    Readmission Risk Interventions     No data to display

## 2023-03-09 ENCOUNTER — Ambulatory Visit: Payer: Medicare PPO | Attending: Gastroenterology | Admitting: Physical Therapy

## 2023-03-16 ENCOUNTER — Ambulatory Visit: Payer: Medicare PPO | Admitting: Physical Therapy

## 2023-03-23 ENCOUNTER — Ambulatory Visit: Payer: Medicare PPO | Admitting: Physical Therapy

## 2023-04-13 ENCOUNTER — Encounter: Payer: Medicare PPO | Admitting: Physical Therapy

## 2023-04-20 ENCOUNTER — Encounter: Payer: Medicare PPO | Admitting: Physical Therapy

## 2023-04-26 ENCOUNTER — Inpatient Hospital Stay: Payer: Medicare (Managed Care)

## 2023-04-26 ENCOUNTER — Encounter: Payer: Self-pay | Admitting: Internal Medicine

## 2023-04-26 ENCOUNTER — Inpatient Hospital Stay: Payer: Medicare (Managed Care) | Attending: Internal Medicine | Admitting: Internal Medicine

## 2023-04-26 VITALS — BP 108/74 | HR 80 | Temp 96.5°F | Resp 16 | Wt 168.0 lb

## 2023-04-26 DIAGNOSIS — Z79899 Other long term (current) drug therapy: Secondary | ICD-10-CM | POA: Diagnosis not present

## 2023-04-26 DIAGNOSIS — I10 Essential (primary) hypertension: Secondary | ICD-10-CM | POA: Diagnosis not present

## 2023-04-26 DIAGNOSIS — I4891 Unspecified atrial fibrillation: Secondary | ICD-10-CM | POA: Insufficient documentation

## 2023-04-26 DIAGNOSIS — E119 Type 2 diabetes mellitus without complications: Secondary | ICD-10-CM | POA: Insufficient documentation

## 2023-04-26 DIAGNOSIS — Z7901 Long term (current) use of anticoagulants: Secondary | ICD-10-CM | POA: Diagnosis not present

## 2023-04-26 DIAGNOSIS — D509 Iron deficiency anemia, unspecified: Secondary | ICD-10-CM | POA: Insufficient documentation

## 2023-04-26 DIAGNOSIS — E785 Hyperlipidemia, unspecified: Secondary | ICD-10-CM | POA: Diagnosis not present

## 2023-04-26 DIAGNOSIS — G4730 Sleep apnea, unspecified: Secondary | ICD-10-CM | POA: Diagnosis not present

## 2023-04-26 DIAGNOSIS — Z9884 Bariatric surgery status: Secondary | ICD-10-CM | POA: Diagnosis not present

## 2023-04-26 NOTE — Progress Notes (Signed)
[facility]  [phone] Fax:(336) [phone]  ID: James Sanford OB: 02-05-1956  MR#: [id number]  YQI#:[id number]  Patient Care Team: Jerl Mina, MD as PCP - General (Family Medicine) Michaelyn Barter, MD as Consulting Physician (Oncology)  REFERRING PROVIDER: Dr. Mia Creek  REASON FOR REFERRAL: IDA  HPI: James Sanford is a 68 y.o. male with past medical history of A-fib, anemia, hyperlipidemia, hypertension referred to hematology for iron deficiency anemia.  Patient was admitted in October 2024 for rectal bleed.  Colonoscopy with Dr. Mia Creek showed internal hemorrhoids, diverticulosis.  Prep was poor.  Upper endoscopy from 12/29/2021 showed 5 cm hiatal hernia.  Evidence of sleeve gastrectomy.  Gastritis.  For colonoscopy, again prep was poor and procedure was aborted.  Flex sig from 04/06/2022 showed large stool in rectosigmoid colon.  Normal mucosa in sigmoid colon.  Labs reviewed from 04/19/2023.  Hemoglobin 8.4, MCV 72.4, WBC 3.5, platelets 221.  Ferritin 10.  Iron 12, saturation 3%.  Vitamin B12 473.  Folate more than 22.  On lab review, patient has anemia at least since 2020 which has been progressively worse.   REVIEW OF SYSTEMS:   ROS  As per HPI. Otherwise, a complete review of systems is negative.  PAST MEDICAL HISTORY: Past Medical History:  Diagnosis Date   A-fib (HCC)    Anemia    Arthritis    Hyperlipemia    Hypertension    Memory deficit    Pre-diabetes    Sleep apnea     PAST SURGICAL HISTORY: Past Surgical History:  Procedure Laterality Date   CATARACT EXTRACTION W/PHACO Right 03/09/2022   Procedure: CATARACT EXTRACTION PHACO AND INTRAOCULAR LENS PLACEMENT (IOC) RIGHT DIABETIC 8.98 01:09.1;  Surgeon: Estanislado Pandy, MD;  Location: [facility]ERY [facility];  Service: Ophthalmology;  Laterality: Right;   COLONOSCOPY N/A 12/29/2021   Procedure: COLONOSCOPY;  Surgeon: Regis Bill, MD;  Location:  [facility] ENDOSCOPY;  Service: Endoscopy;  Laterality: N/A;   COLONOSCOPY WITH PROPOFOL N/A 01/28/2020   Procedure: COLONOSCOPY WITH PROPOFOL;  Surgeon: Toney Reil, MD;  Location: [facility] ENDOSCOPY;  Service: Gastroenterology;  Laterality: N/A;   COLONOSCOPY WITH PROPOFOL N/A 01/28/2020   Procedure: COLONOSCOPY WITH PROPOFOL;  Surgeon: Toney Reil, MD;  Location: [facility] ENDOSCOPY;  Service: Gastroenterology;  Laterality: N/A;   COLONOSCOPY WITH PROPOFOL N/A 01/20/2023   Procedure: COLONOSCOPY WITH PROPOFOL;  Surgeon: Regis Bill, MD;  Location: [facility] ENDOSCOPY;  Service: Endoscopy;  Laterality: N/A;   ESOPHAGOGASTRODUODENOSCOPY N/A 12/29/2021   Procedure: ESOPHAGOGASTRODUODENOSCOPY (EGD);  Surgeon: Regis Bill, MD;  Location: [facility] ENDOSCOPY;  Service: Endoscopy;  Laterality: N/A;   ESOPHAGOGASTRODUODENOSCOPY (EGD) WITH PROPOFOL N/A 01/28/2020   Procedure: ESOPHAGOGASTRODUODENOSCOPY (EGD) WITH PROPOFOL;  Surgeon: Toney Reil, MD;  Location: [facility] ENDOSCOPY;  Service: Gastroenterology;  Laterality: N/A;   FLEXIBLE SIGMOIDOSCOPY N/A 04/06/2022   Procedure: FLEXIBLE SIGMOIDOSCOPY;  Surgeon: Regis Bill, MD;  Location: [facility] ENDOSCOPY;  Service: Endoscopy;  Laterality: N/A;   GASTRIC BYPASS     HERNIA REPAIR     INTRAMEDULLARY (IM) NAIL INTERTROCHANTERIC Left 11/25/2020   Procedure: INTRAMEDULLARY (IM) NAIL INTERTROCHANTRIC;  Surgeon: Lyndle Herrlich, MD;  Location: [facility] ORS;  Service: Orthopedics;  Laterality: Left;   JOINT REPLACEMENT Bilateral    Knee surgery   OPEN REDUCTION INTERNAL FIXATION (ORIF) DISTAL RADIAL FRACTURE Right 10/30/2017   Procedure: OPEN REDUCTION INTERNAL FIXATION (ORIF) DISTAL RADIAL FRACTURE;  Surgeon: Garnette Gunner, MD;  Location: [facility] ORS;  Service: Orthopedics;  Laterality: Right;   ORIF  FEMUR FRACTURE Right 01/29/2020   Procedure: OPEN REDUCTION INTERNAL FIXATION (ORIF) DISTAL FEMUR FRACTURE;  Surgeon: Christena Flake, MD;  Location: [facility]  ORS;  Service: Orthopedics;  Laterality: Right;   ORIF FEMUR FRACTURE Left 08/04/2022   Procedure: OPEN REDUCTION INTERNAL FIXATION (ORIF) DISTAL FEMUR FRACTURE;  Surgeon: Roby Lofts, MD;  Location: [facility] OR;  Service: Orthopedics;  Laterality: Left;    FAMILY HISTORY: Family History  Problem Relation Age of Onset   Stroke Mother    Suicidality Father     HEALTH MAINTENANCE: Social History   Tobacco Use   Smoking status: Never   Smokeless tobacco: Former    Types: Chew   Tobacco comments:    quir about 4 years ago  Vaping Use   Vaping status: Never Used  Substance Use Topics   Alcohol use: Not Currently    Comment: beer occasionally   Drug use: Not Currently     Allergies  Allergen Reactions   Penicillins Diarrhea    TOLERATED CEFAZOLIN 11/25/20 Unknown childhood reaction   [place]     Current Outpatient Medications  Medication Sig Dispense Refill   Cholecalciferol (VITAMIN D) 50 MCG (2000 UT) CAPS Take 1 capsule by mouth daily.     CREON 24000-76000 units CPEP Take 2 capsules by mouth 4 (four) times daily -  before meals and at bedtime.     ELIQUIS 5 MG TABS tablet Take 5 mg by mouth 2 (two) times daily.     ferrous sulfate 325 (65 FE) MG EC tablet Take by mouth.     neomycin (MYCIFRADIN) 500 MG tablet Take by mouth.     omega-3 acid ethyl esters (LOVAZA) 1 g capsule Take 1 g by mouth daily.     polyethylene glycol (MIRALAX / GLYCOLAX) 17 g packet Take 17 g by mouth daily as needed for mild constipation. 14 each 0   rifaximin (XIFAXAN) 550 MG TABS tablet Take by mouth.     CALTRATE 600+D3 SOFT 600-20 MG-MCG CHEW Chew 1 tablet by mouth daily. (Patient not taking: Reported on 04/26/2023)     Multiple Vitamin (MULTIVITAMIN WITH MINERALS) TABS tablet Take 1 tablet by mouth daily. (Patient not taking: Reported on 04/26/2023)     VIGAMOX 0.5 % ophthalmic solution Place 1 drop into the right eye 4 (four) times daily. (Patient not taking: Reported on 01/19/2023)     No  current facility-administered medications for this visit.    OBJECTIVE: Vitals:   04/26/23 1437  BP: 108/74  Pulse: 80  Resp: 16  Temp: (!) 96.5 F (35.8 C)  SpO2: 100%     Body mass index is 22.78 kg/m.      General: Well-developed, well-nourished, no acute distress. Eyes: Pink conjunctiva, anicteric sclera. HEENT: Normocephalic, moist mucous membranes, clear oropharnyx. Lungs: Clear to auscultation bilaterally. Heart: Regular rate and rhythm. No rubs, murmurs, or gallops. Abdomen: Soft, nontender, nondistended. No organomegaly noted, normoactive bowel sounds. Musculoskeletal: No edema, cyanosis, or clubbing. Neuro: Alert, answering all questions appropriately. Cranial nerves grossly intact. Skin: No rashes or petechiae noted. Psych: Normal affect. Lymphatics: No cervical, calvicular, axillary or inguinal LAD.   LAB RESULTS:  Lab Results  Component Value Date   NA 143 01/18/2023   K 4.0 01/18/2023   CL 113 (H) 01/18/2023   CO2 22 01/18/2023   GLUCOSE 103 (H) 01/18/2023   BUN 32 (H) 01/18/2023   CREATININE 0.70 01/18/2023   CALCIUM 8.4 (L) 01/18/2023   PROT 6.7 03/05/2022   ALBUMIN 3.5  03/05/2022   AST 29 03/05/2022   ALT 23 03/05/2022   ALKPHOS 106 03/05/2022   BILITOT 0.9 03/05/2022   GFRNONAA >60 01/18/2023   GFRAA >60 10/31/2017    Lab Results  Component Value Date   WBC 5.4 01/18/2023   NEUTROABS 3.2 08/08/2022   HGB 7.7 (L) 01/21/2023   HCT 24.3 (L) 01/21/2023   MCV 89.9 01/18/2023   PLT 156 01/18/2023    Lab Results  Component Value Date   TIBC 259 12/04/2020   TIBC 162 (L) 01/26/2020   TIBC 300 02/14/2013   FERRITIN 54 12/04/2020   FERRITIN 261 01/26/2020   FERRITIN 252 02/14/2013   IRONPCTSAT 12 (L) 12/04/2020   IRONPCTSAT 24 01/26/2020   IRONPCTSAT 74 02/14/2013     STUDIES: No results found.  ASSESSMENT AND PLAN:   James Sanford is a 68 y.o. male with pmh of A-fib, anemia, hyperlipidemia, hypertension referred to  hematology for iron deficiency anemia.  # Iron deficiency anemia -Present since 2020.  Recent labs showed hemoglobin of 8.4, ferritin 10, saturation 3%.  Has history of sleeve gastrectomy. - Patient was admitted in October 2024 for rectal bleed.  Colonoscopy with Dr. Mia Creek showed internal hemorrhoids, diverticulosis.  Prep was poor.  - Upper endoscopy from 12/29/2021 showed 5 cm hiatal hernia.  Evidence of sleeve gastrectomy.  Gastritis.  For colonoscopy, again prep was poor and procedure was aborted.  Flex sig from 04/06/2022 showed large stool in rectosigmoid colon.  Normal mucosa in sigmoid colon.  -Follows with GI Dr. Mia Creek.  Will schedule for IV Venofer 200 mg weekly x 5 doses.  Side effects such as nausea, chest pain, back pain, allergic reaction was discussed.  Patient agreeable to proceed.  I will follow-up with him in 3 months with repeat labs.  Patient initially did not decline but then after speaking with his daughter he agreed.  Orders Placed This Encounter  Procedures   CBC with Differential (Cancer Center Only)   Ferritin   Iron and TIBC([facility])   RTC in 3 months for MD visit, labs, possible Venofer.  Patient expressed understanding and was in agreement with this plan. He also understands that He can call clinic at any time with any questions, concerns, or complaints.   I spent a total of 45 minutes reviewing chart data, face-to-face evaluation with the patient, counseling and coordination of care as detailed above.  Michaelyn Barter, MD   04/26/2023 3:09 PM

## 2023-04-27 ENCOUNTER — Encounter: Payer: Medicare PPO | Admitting: Physical Therapy

## 2023-04-27 ENCOUNTER — Other Ambulatory Visit: Payer: Self-pay | Admitting: Gastroenterology

## 2023-04-27 DIAGNOSIS — Z9889 Other specified postprocedural states: Secondary | ICD-10-CM

## 2023-04-29 ENCOUNTER — Telehealth: Payer: Self-pay | Admitting: Internal Medicine

## 2023-04-29 ENCOUNTER — Inpatient Hospital Stay: Payer: Medicare (Managed Care)

## 2023-04-29 VITALS — BP 91/61 | HR 60 | Temp 98.4°F | Resp 18

## 2023-04-29 DIAGNOSIS — D509 Iron deficiency anemia, unspecified: Secondary | ICD-10-CM | POA: Diagnosis not present

## 2023-04-29 MED ORDER — IRON SUCROSE 20 MG/ML IV SOLN
200.0000 mg | Freq: Once | INTRAVENOUS | Status: AC
  Administered 2023-04-29: 200 mg via INTRAVENOUS

## 2023-04-29 MED ORDER — SODIUM CHLORIDE 0.9% FLUSH
10.0000 mL | Freq: Once | INTRAVENOUS | Status: AC | PRN
  Administered 2023-04-29: 10 mL
  Filled 2023-04-29: qty 10

## 2023-04-29 NOTE — Telephone Encounter (Signed)
Pt spouse called and left a message to change the infusion appts as she is scheduled to be in the hospital in feb. Please call pt spouse back. Number in the chart for spouse is correct.

## 2023-05-02 NOTE — Telephone Encounter (Signed)
Attempted to reach spouse- no answer. Msg to scheduling sent ok to sch. iron infusions twice a week. Total of 5 IV iron infusions

## 2023-05-04 ENCOUNTER — Encounter: Payer: Medicare PPO | Admitting: Physical Therapy

## 2023-05-04 NOTE — Telephone Encounter (Signed)
Pt wife called back, we have rescheduled based on pt preference.

## 2023-05-06 ENCOUNTER — Inpatient Hospital Stay: Payer: Medicare (Managed Care)

## 2023-05-06 VITALS — BP 115/79 | HR 53 | Temp 96.7°F | Resp 16

## 2023-05-06 DIAGNOSIS — D509 Iron deficiency anemia, unspecified: Secondary | ICD-10-CM | POA: Diagnosis not present

## 2023-05-06 MED ORDER — SODIUM CHLORIDE 0.9% FLUSH
10.0000 mL | Freq: Once | INTRAVENOUS | Status: AC | PRN
  Administered 2023-05-06: 10 mL
  Filled 2023-05-06: qty 10

## 2023-05-06 MED ORDER — IRON SUCROSE 20 MG/ML IV SOLN
200.0000 mg | Freq: Once | INTRAVENOUS | Status: AC
  Administered 2023-05-06: 200 mg via INTRAVENOUS
  Filled 2023-05-06: qty 10

## 2023-05-06 NOTE — Progress Notes (Signed)
Pt has been educated and understands. Pt declined to stay the recommended 30 mins after iron infusion. VSS.

## 2023-05-09 ENCOUNTER — Inpatient Hospital Stay: Payer: Medicare (Managed Care) | Attending: Internal Medicine

## 2023-05-09 VITALS — BP 122/80 | HR 63 | Temp 97.0°F | Resp 19

## 2023-05-09 DIAGNOSIS — D509 Iron deficiency anemia, unspecified: Secondary | ICD-10-CM | POA: Insufficient documentation

## 2023-05-09 MED ORDER — IRON SUCROSE 20 MG/ML IV SOLN
200.0000 mg | Freq: Once | INTRAVENOUS | Status: AC
  Administered 2023-05-09: 200 mg via INTRAVENOUS

## 2023-05-09 MED ORDER — SODIUM CHLORIDE 0.9% FLUSH
10.0000 mL | Freq: Once | INTRAVENOUS | Status: AC | PRN
  Administered 2023-05-09: 10 mL
  Filled 2023-05-09: qty 10

## 2023-05-11 ENCOUNTER — Inpatient Hospital Stay: Payer: Medicare (Managed Care)

## 2023-05-11 ENCOUNTER — Encounter: Payer: Medicare PPO | Admitting: Physical Therapy

## 2023-05-11 VITALS — BP 117/86 | HR 56 | Temp 97.0°F | Resp 18

## 2023-05-11 DIAGNOSIS — D509 Iron deficiency anemia, unspecified: Secondary | ICD-10-CM

## 2023-05-11 MED ORDER — IRON SUCROSE 20 MG/ML IV SOLN
200.0000 mg | Freq: Once | INTRAVENOUS | Status: AC
  Administered 2023-05-11: 200 mg via INTRAVENOUS

## 2023-05-18 ENCOUNTER — Encounter: Payer: Medicare PPO | Admitting: Physical Therapy

## 2023-05-20 ENCOUNTER — Inpatient Hospital Stay: Payer: Medicare (Managed Care)

## 2023-05-25 ENCOUNTER — Encounter: Payer: Medicare PPO | Admitting: Physical Therapy

## 2023-05-27 ENCOUNTER — Inpatient Hospital Stay: Payer: Medicare (Managed Care)

## 2023-05-31 ENCOUNTER — Encounter: Payer: Self-pay | Admitting: Gastroenterology

## 2023-06-02 ENCOUNTER — Inpatient Hospital Stay: Payer: Medicare (Managed Care)

## 2023-06-10 ENCOUNTER — Ambulatory Visit
Admission: RE | Admit: 2023-06-10 | Discharge: 2023-06-10 | Disposition: A | Discharge status: Home / Self Care | Payer: Medicare (Managed Care) | Source: Ambulatory Visit | Attending: Gastroenterology | Admitting: Gastroenterology

## 2023-06-10 DIAGNOSIS — Z9889 Other specified postprocedural states: Secondary | ICD-10-CM

## 2023-06-15 ENCOUNTER — Inpatient Hospital Stay: Payer: Medicare (Managed Care)

## 2023-06-16 ENCOUNTER — Inpatient Hospital Stay: Payer: Medicare (Managed Care)

## 2023-06-22 ENCOUNTER — Inpatient Hospital Stay: Payer: Medicare (Managed Care) | Attending: Internal Medicine

## 2023-06-22 VITALS — BP 119/87 | HR 67 | Temp 95.1°F | Resp 17

## 2023-06-22 DIAGNOSIS — Z79899 Other long term (current) drug therapy: Secondary | ICD-10-CM | POA: Diagnosis not present

## 2023-06-22 DIAGNOSIS — D509 Iron deficiency anemia, unspecified: Secondary | ICD-10-CM | POA: Insufficient documentation

## 2023-06-22 MED ORDER — SODIUM CHLORIDE 0.9% FLUSH
10.0000 mL | Freq: Once | INTRAVENOUS | Status: AC | PRN
  Administered 2023-06-22: 10 mL
  Filled 2023-06-22: qty 10

## 2023-06-22 MED ORDER — IRON SUCROSE 20 MG/ML IV SOLN
200.0000 mg | Freq: Once | INTRAVENOUS | Status: AC
  Administered 2023-06-22: 200 mg via INTRAVENOUS
  Filled 2023-06-22: qty 10

## 2023-07-28 ENCOUNTER — Inpatient Hospital Stay: Payer: Medicare (Managed Care) | Attending: Internal Medicine

## 2023-07-28 ENCOUNTER — Inpatient Hospital Stay (HOSPITAL_BASED_OUTPATIENT_CLINIC_OR_DEPARTMENT_OTHER): Payer: Medicare (Managed Care) | Admitting: Internal Medicine

## 2023-07-28 ENCOUNTER — Inpatient Hospital Stay: Payer: Medicare (Managed Care)

## 2023-07-28 ENCOUNTER — Encounter: Payer: Self-pay | Admitting: Internal Medicine

## 2023-07-28 VITALS — HR 60 | Temp 97.2°F | Resp 14 | Wt 151.0 lb

## 2023-07-28 VITALS — BP 136/85 | HR 57 | Resp 16

## 2023-07-28 DIAGNOSIS — D509 Iron deficiency anemia, unspecified: Secondary | ICD-10-CM | POA: Insufficient documentation

## 2023-07-28 DIAGNOSIS — Z903 Acquired absence of stomach [part of]: Secondary | ICD-10-CM | POA: Insufficient documentation

## 2023-07-28 LAB — IRON AND TIBC
Iron: 81 ug/dL (ref 45–182)
Saturation Ratios: 24 % (ref 17.9–39.5)
TIBC: 332 ug/dL (ref 250–450)
UIBC: 251 ug/dL

## 2023-07-28 LAB — CBC WITH DIFFERENTIAL (CANCER CENTER ONLY)
Abs Immature Granulocytes: 0.01 10*3/uL (ref 0.00–0.07)
Basophils Absolute: 0 10*3/uL (ref 0.0–0.1)
Basophils Relative: 1 %
Eosinophils Absolute: 0.1 10*3/uL (ref 0.0–0.5)
Eosinophils Relative: 2 %
HCT: 36.4 % — ABNORMAL LOW (ref 39.0–52.0)
Hemoglobin: 11.5 g/dL — ABNORMAL LOW (ref 13.0–17.0)
Immature Granulocytes: 0 %
Lymphocytes Relative: 30 %
Lymphs Abs: 1.3 10*3/uL (ref 0.7–4.0)
MCH: 29.2 pg (ref 26.0–34.0)
MCHC: 31.6 g/dL (ref 30.0–36.0)
MCV: 92.4 fL (ref 80.0–100.0)
Monocytes Absolute: 0.4 10*3/uL (ref 0.1–1.0)
Monocytes Relative: 9 %
Neutro Abs: 2.6 10*3/uL (ref 1.7–7.7)
Neutrophils Relative %: 58 %
Platelet Count: 152 10*3/uL (ref 150–400)
RBC: 3.94 MIL/uL — ABNORMAL LOW (ref 4.22–5.81)
RDW: 20.7 % — ABNORMAL HIGH (ref 11.5–15.5)
WBC Count: 4.5 10*3/uL (ref 4.0–10.5)
nRBC: 0 % (ref 0.0–0.2)

## 2023-07-28 LAB — FERRITIN: Ferritin: 54 ng/mL (ref 24–336)

## 2023-07-28 MED ORDER — IRON SUCROSE 20 MG/ML IV SOLN
200.0000 mg | Freq: Once | INTRAVENOUS | Status: AC
  Administered 2023-07-28: 200 mg via INTRAVENOUS

## 2023-07-28 NOTE — Patient Instructions (Signed)

## 2023-07-28 NOTE — Progress Notes (Signed)
 Patient is doing a little better since moving in at the independent living called [facility]. No new questions or concerns for the doctor.

## 2023-07-28 NOTE — Progress Notes (Signed)
 [facility]  Telephone:(336) [phone] Fax:(336) [phone]  ID: James Sanford OB: May 15, 1955  MR#: [id number]  MVH#:[id number]  Patient Care Team: Lyle San, MD as PCP - General (Family Medicine) Loreatha Rodney, MD as Consulting Physician (Oncology)  REFERRING PROVIDER: Dr. Emerick Hanlon  REASON FOR REFERRAL: IDA  HPI: James Sanford is a 68 y.o. male with past medical history of A-fib, anemia, hyperlipidemia, hypertension referred to hematology for iron  deficiency anemia.  Patient was admitted in October 2024 for rectal bleed.  Colonoscopy with Dr. Emerick Hanlon showed internal hemorrhoids, diverticulosis.  Prep was poor.  Upper endoscopy from 12/29/2021 showed 5 cm hiatal hernia.  Evidence of sleeve gastrectomy.  Gastritis.  For colonoscopy, again prep was poor and procedure was aborted.  Flex sig from 04/06/2022 showed large stool in rectosigmoid colon.  Normal mucosa in sigmoid colon.  Labs reviewed from 04/19/2023.  Hemoglobin 8.4, MCV 72.4, WBC 3.5, platelets 221.  Ferritin 10.  Iron  12, saturation 3%.  Vitamin B12 473.  Folate more than 22.  On lab review, patient has anemia at least since 2020 which has been progressively worse.  Completed IV Venofer  weekly x 5 doses in March 2025.  CT colonoscopy from 06/10/2023-study was suboptimal.  Areas of nondistention in the descending and sigmoid colon.  No visible colonic lesions.  Interval history Patient was seen today as follow-up for iron  deficiency anemia, labs. Patient has moved from assisted facility to independent living facility.  After the iron  infusions, he has noticed improvement in his energy level.  He has been able to walk around and do more things.  Continues to follow-up with Dr. Emerick Hanlon for diarrhea.  Per patient's wife, he recently had CT colonoscopy in [place].     REVIEW OF SYSTEMS:   ROS  As per HPI. Otherwise, a complete review of systems is negative.  PAST MEDICAL  HISTORY: Past Medical History:  Diagnosis Date   A-fib (HCC)    Anemia    Arthritis    Hyperlipemia    Hypertension    Memory deficit    Pre-diabetes    Sleep apnea     PAST SURGICAL HISTORY: Past Surgical History:  Procedure Laterality Date   CATARACT EXTRACTION W/PHACO Right 03/09/2022   Procedure: CATARACT EXTRACTION PHACO AND INTRAOCULAR LENS PLACEMENT (IOC) RIGHT DIABETIC 8.98 01:09.1;  Surgeon: Trudi Fus, MD;  Location: [facility]GERY [facility];  Service: Ophthalmology;  Laterality: Right;   COLONOSCOPY N/A 12/29/2021   Procedure: COLONOSCOPY;  Surgeon: Shane Darling, MD;  Location: [facility] ENDOSCOPY;  Service: Endoscopy;  Laterality: N/A;   COLONOSCOPY WITH PROPOFOL  N/A 01/28/2020   Procedure: COLONOSCOPY WITH PROPOFOL ;  Surgeon: Selena Daily, MD;  Location: [facility] ENDOSCOPY;  Service: Gastroenterology;  Laterality: N/A;   COLONOSCOPY WITH PROPOFOL  N/A 01/28/2020   Procedure: COLONOSCOPY WITH PROPOFOL ;  Surgeon: Selena Daily, MD;  Location: [facility] ENDOSCOPY;  Service: Gastroenterology;  Laterality: N/A;   COLONOSCOPY WITH PROPOFOL  N/A 01/20/2023   Procedure: COLONOSCOPY WITH PROPOFOL ;  Surgeon: Shane Darling, MD;  Location: [facility] ENDOSCOPY;  Service: Endoscopy;  Laterality: N/A;   ESOPHAGOGASTRODUODENOSCOPY N/A 12/29/2021   Procedure: ESOPHAGOGASTRODUODENOSCOPY (EGD);  Surgeon: Shane Darling, MD;  Location: [facility] ENDOSCOPY;  Service: Endoscopy;  Laterality: N/A;   ESOPHAGOGASTRODUODENOSCOPY (EGD) WITH PROPOFOL  N/A 01/28/2020   Procedure: ESOPHAGOGASTRODUODENOSCOPY (EGD) WITH PROPOFOL ;  Surgeon: Selena Daily, MD;  Location: [facility] ENDOSCOPY;  Service: Gastroenterology;  Laterality: N/A;   FLEXIBLE SIGMOIDOSCOPY N/A 04/06/2022   Procedure: FLEXIBLE SIGMOIDOSCOPY;  Surgeon: Shane Darling, MD;  Location: [facility] ENDOSCOPY;  Service: Endoscopy;  Laterality: N/A;   GASTRIC BYPASS     HERNIA REPAIR     INTRAMEDULLARY (IM) NAIL INTERTROCHANTERIC Left  11/25/2020   Procedure: INTRAMEDULLARY (IM) NAIL INTERTROCHANTRIC;  Surgeon: Jerlyn Moons, MD;  Location: [facility] ORS;  Service: Orthopedics;  Laterality: Left;   JOINT REPLACEMENT Bilateral    Knee surgery   OPEN REDUCTION INTERNAL FIXATION (ORIF) DISTAL RADIAL FRACTURE Right 10/30/2017   Procedure: OPEN REDUCTION INTERNAL FIXATION (ORIF) DISTAL RADIAL FRACTURE;  Surgeon: Harless Lien, MD;  Location: [facility] ORS;  Service: Orthopedics;  Laterality: Right;   ORIF FEMUR FRACTURE Right 01/29/2020   Procedure: OPEN REDUCTION INTERNAL FIXATION (ORIF) DISTAL FEMUR FRACTURE;  Surgeon: Elner Hahn, MD;  Location: [facility] ORS;  Service: Orthopedics;  Laterality: Right;   ORIF FEMUR FRACTURE Left 08/04/2022   Procedure: OPEN REDUCTION INTERNAL FIXATION (ORIF) DISTAL FEMUR FRACTURE;  Surgeon: Laneta Pintos, MD;  Location: [facility] OR;  Service: Orthopedics;  Laterality: Left;    FAMILY HISTORY: Family History  Problem Relation Age of Onset   Stroke Mother    Suicidality Father     HEALTH MAINTENANCE: Social History   Tobacco Use   Smoking status: Never   Smokeless tobacco: Former    Types: Chew   Tobacco comments:    quir about 4 years ago  Vaping Use   Vaping status: Never Used  Substance Use Topics   Alcohol use: Not Currently    Comment: beer occasionally   Drug use: Not Currently     Allergies  Allergen Reactions   Penicillins Diarrhea    TOLERATED CEFAZOLIN  11/25/20 Unknown childhood reaction   [place]     Current Outpatient Medications  Medication Sig Dispense Refill   Cholecalciferol  (VITAMIN D ) 50 MCG (2000 UT) CAPS Take 1 capsule by mouth daily.     CREON  24000-76000 units CPEP Take 2 capsules by mouth 4 (four) times daily -  before meals and at bedtime.     ELIQUIS  5 MG TABS tablet Take 5 mg by mouth 2 (two) times daily.     ferrous sulfate  325 (65 FE) MG EC tablet Take by mouth.     Multiple Vitamin (MULTIVITAMIN WITH MINERALS) TABS tablet Take 1 tablet by mouth daily.      neomycin (MYCIFRADIN) 500 MG tablet Take 500 mg by mouth 2 (two) times daily.     omega-3 acid ethyl esters (LOVAZA ) 1 g capsule Take 1 g by mouth daily.     polyethylene glycol (MIRALAX  / GLYCOLAX ) 17 g packet Take 17 g by mouth daily as needed for mild constipation. 14 each 0   Prucalopride Succinate 2 MG TABS Take 2 mg by mouth.     XIFAXAN  550 MG TABS tablet Take 550 mg by mouth 2 (two) times daily.     CALTRATE 600+D3 SOFT 600-20 MG-MCG CHEW Chew 1 tablet by mouth daily. (Patient not taking: Reported on 07/28/2023)     No current facility-administered medications for this visit.    OBJECTIVE: Vitals:   07/28/23 1345  Pulse: 60  Resp: 14  Temp: (!) 97.2 F (36.2 C)  SpO2: 100%      Body mass index is 20.48 kg/m.      General: Well-developed, well-nourished, no acute distress. Eyes: Pink conjunctiva, anicteric sclera. HEENT: Normocephalic, moist mucous membranes, clear oropharnyx. Lungs: Clear to auscultation bilaterally. Heart: Regular rate and rhythm. No rubs, murmurs, or gallops. Abdomen: Soft, nontender, nondistended. No organomegaly noted, normoactive bowel sounds. Musculoskeletal: No edema,  cyanosis, or clubbing. Neuro: Alert, answering all questions appropriately. Cranial nerves grossly intact. Skin: No rashes or petechiae noted. Psych: Normal affect. Lymphatics: No cervical, calvicular, axillary or inguinal LAD.   LAB RESULTS:  Lab Results  Component Value Date   NA 143 01/18/2023   K 4.0 01/18/2023   CL 113 (H) 01/18/2023   CO2 22 01/18/2023   GLUCOSE 103 (H) 01/18/2023   BUN 32 (H) 01/18/2023   CREATININE 0.70 01/18/2023   CALCIUM  8.4 (L) 01/18/2023   PROT 6.7 03/05/2022   ALBUMIN  3.5 03/05/2022   AST 29 03/05/2022   ALT 23 03/05/2022   ALKPHOS 106 03/05/2022   BILITOT 0.9 03/05/2022   GFRNONAA >60 01/18/2023   GFRAA >60 10/31/2017    Lab Results  Component Value Date   WBC 4.5 07/28/2023   NEUTROABS 2.6 07/28/2023   HGB 11.5 (L) 07/28/2023    HCT 36.4 (L) 07/28/2023   MCV 92.4 07/28/2023   PLT 152 07/28/2023    Lab Results  Component Value Date   TIBC 332 07/28/2023   TIBC 259 12/04/2020   TIBC 162 (L) 01/26/2020   FERRITIN 54 07/28/2023   FERRITIN 54 12/04/2020   FERRITIN 261 01/26/2020   IRONPCTSAT 24 07/28/2023   IRONPCTSAT 12 (L) 12/04/2020   IRONPCTSAT 24 01/26/2020     STUDIES: No results found.  ASSESSMENT AND PLAN:   James Sanford is a 68 y.o. male with pmh of A-fib, anemia, hyperlipidemia, hypertension referred to hematology for iron  deficiency anemia.  # Iron  deficiency anemia # History of sleeve gastrectomy -Present since 2020.  Has history of sleeve gastrectomy which can contribute to malabsorption. -Patient was admitted in October 2024 for rectal bleed.  Colonoscopy with Dr. Emerick Hanlon showed internal hemorrhoids, diverticulosis.  Prep was poor.  - Upper endoscopy from 12/29/2021 showed 5 cm hiatal hernia.  Evidence of sleeve gastrectomy.  Gastritis.  For colonoscopy, again prep was poor and procedure was aborted.  Flex sig from 04/06/2022 showed large stool in rectosigmoid colon.  Normal mucosa in sigmoid colon. CT colonoscopy from 06/10/2023-study was suboptimal.  Areas of nondistention in the descending and sigmoid colon.  No visible colonic lesions.Follows with GI Dr. Emerick Hanlon.    -Completed IV Venofer  200 mg x 5 doses in March 2025.  Great response to the treatment.  Hemoglobin improved from 7.7-11.5 today.  Iron  panel is pending.  Will give him 1 dose of IV Venofer  200 mg today.  Will schedule further dosing depending on the lab results.   Orders Placed This Encounter  Procedures   Iron  and TIBC   Ferritin   CBC with Differential/Platelet   Vitamin B12   RTC in 6 months for MD visit, labs, possible Venofer .  Patient expressed understanding and was in agreement with this plan. He also understands that He can call clinic at any time with any questions, concerns, or complaints.   I spent  a total of 25 minutes reviewing chart data, face-to-face evaluation with the patient, counseling and coordination of care as detailed above.  Kasen Adduci, MD   07/28/2023 2:46 PM

## 2023-08-31 ENCOUNTER — Encounter: Payer: Self-pay | Admitting: Internal Medicine

## 2023-09-14 ENCOUNTER — Encounter: Payer: Self-pay | Admitting: Internal Medicine

## 2023-09-29 ENCOUNTER — Encounter: Payer: Self-pay | Admitting: Internal Medicine

## 2023-10-05 ENCOUNTER — Encounter: Payer: Self-pay | Admitting: Internal Medicine

## 2024-01-26 ENCOUNTER — Other Ambulatory Visit: Payer: Self-pay

## 2024-01-26 DIAGNOSIS — D509 Iron deficiency anemia, unspecified: Secondary | ICD-10-CM

## 2024-01-27 ENCOUNTER — Inpatient Hospital Stay: Payer: Medicare (Managed Care) | Admitting: Oncology

## 2024-01-27 ENCOUNTER — Encounter: Payer: Self-pay | Admitting: Oncology

## 2024-01-27 ENCOUNTER — Inpatient Hospital Stay: Payer: Medicare (Managed Care) | Attending: Oncology

## 2024-01-27 ENCOUNTER — Inpatient Hospital Stay: Payer: Medicare (Managed Care)
